# Patient Record
Sex: Male | Born: 1940
Health system: Southern US, Community
[De-identification: ages and names within clinical notes are randomized; demographics above are authoritative.]

## PROBLEM LIST (undated history)

## (undated) DIAGNOSIS — C801 Malignant (primary) neoplasm, unspecified: Secondary | ICD-10-CM

## (undated) DIAGNOSIS — E785 Hyperlipidemia, unspecified: Secondary | ICD-10-CM

## (undated) DIAGNOSIS — I255 Ischemic cardiomyopathy: Secondary | ICD-10-CM

## (undated) DIAGNOSIS — R011 Cardiac murmur, unspecified: Secondary | ICD-10-CM

## (undated) DIAGNOSIS — D126 Benign neoplasm of colon, unspecified: Secondary | ICD-10-CM

## (undated) DIAGNOSIS — H9319 Tinnitus, unspecified ear: Secondary | ICD-10-CM

## (undated) DIAGNOSIS — F419 Anxiety disorder, unspecified: Secondary | ICD-10-CM

## (undated) DIAGNOSIS — H9190 Unspecified hearing loss, unspecified ear: Secondary | ICD-10-CM

## (undated) DIAGNOSIS — E039 Hypothyroidism, unspecified: Secondary | ICD-10-CM

## (undated) DIAGNOSIS — IMO0002 Reserved for concepts with insufficient information to code with codable children: Secondary | ICD-10-CM

## (undated) DIAGNOSIS — D696 Thrombocytopenia, unspecified: Secondary | ICD-10-CM

## (undated) DIAGNOSIS — I4892 Unspecified atrial flutter: Secondary | ICD-10-CM

## (undated) DIAGNOSIS — I10 Essential (primary) hypertension: Secondary | ICD-10-CM

## (undated) DIAGNOSIS — K5790 Diverticulosis of intestine, part unspecified, without perforation or abscess without bleeding: Secondary | ICD-10-CM

## (undated) DIAGNOSIS — I2510 Atherosclerotic heart disease of native coronary artery without angina pectoris: Secondary | ICD-10-CM

## (undated) DIAGNOSIS — M542 Cervicalgia: Secondary | ICD-10-CM

## (undated) DIAGNOSIS — I4819 Other persistent atrial fibrillation: Secondary | ICD-10-CM

## (undated) DIAGNOSIS — B977 Papillomavirus as the cause of diseases classified elsewhere: Secondary | ICD-10-CM

## (undated) DIAGNOSIS — Z87438 Personal history of other diseases of male genital organs: Secondary | ICD-10-CM

## (undated) DIAGNOSIS — I519 Heart disease, unspecified: Secondary | ICD-10-CM

## (undated) DIAGNOSIS — Z923 Personal history of irradiation: Secondary | ICD-10-CM

## (undated) HISTORY — DX: Tinnitus, unspecified ear: H93.19

## (undated) HISTORY — DX: Essential (primary) hypertension: I10

## (undated) HISTORY — DX: Reserved for concepts with insufficient information to code with codable children: IMO0002

## (undated) HISTORY — DX: Other persistent atrial fibrillation: I48.19

## (undated) HISTORY — DX: Personal history of other diseases of male genital organs: Z87.438

## (undated) HISTORY — DX: Unspecified hearing loss, unspecified ear: H91.90

## (undated) HISTORY — DX: Hyperlipidemia, unspecified: E78.5

## (undated) HISTORY — DX: Heart disease, unspecified: I51.9

## (undated) HISTORY — DX: Diverticulosis of intestine, part unspecified, without perforation or abscess without bleeding: K57.90

## (undated) HISTORY — PX: OTHER SURGICAL HISTORY: SHX169

## (undated) HISTORY — PX: VASECTOMY: SHX75

## (undated) HISTORY — DX: Malignant (primary) neoplasm, unspecified: C80.1

## (undated) HISTORY — DX: Atherosclerotic heart disease of native coronary artery without angina pectoris: I25.10

## (undated) HISTORY — DX: Personal history of irradiation: Z92.3

## (undated) HISTORY — DX: Cervicalgia: M54.2

## (undated) HISTORY — DX: Anxiety disorder, unspecified: F41.9

## (undated) HISTORY — PX: TONSILLECTOMY: SUR1361

## (undated) HISTORY — DX: Unspecified atrial flutter: I48.92

## (undated) HISTORY — DX: Papillomavirus as the cause of diseases classified elsewhere: B97.7

## (undated) HISTORY — PX: DENTAL SURGERY: SHX609

## (undated) HISTORY — DX: Ischemic cardiomyopathy: I25.5

## (undated) HISTORY — PX: KNEE ARTHROSCOPY: SUR90

## (undated) HISTORY — DX: Cardiac murmur, unspecified: R01.1

## (undated) HISTORY — DX: Hypothyroidism, unspecified: E03.9

## (undated) HISTORY — PX: CARDIAC CATHETERIZATION: SHX172

## (undated) HISTORY — DX: Thrombocytopenia, unspecified: D69.6

## (undated) HISTORY — DX: Benign neoplasm of colon, unspecified: D12.6

## (undated) MED FILL — Dexamethasone Sodium Phosphate Inj 100 MG/10ML: INTRAMUSCULAR | Qty: 1 | Status: AC

---

## 2001-08-21 DIAGNOSIS — I2510 Atherosclerotic heart disease of native coronary artery without angina pectoris: Secondary | ICD-10-CM

## 2001-08-21 HISTORY — DX: Atherosclerotic heart disease of native coronary artery without angina pectoris: I25.10

## 2002-05-23 ENCOUNTER — Ambulatory Visit (HOSPITAL_COMMUNITY): Admission: RE | Admit: 2002-05-23 | Discharge: 2002-05-23 | Payer: Self-pay | Admitting: Cardiology

## 2002-07-09 ENCOUNTER — Ambulatory Visit (HOSPITAL_COMMUNITY): Admission: RE | Admit: 2002-07-09 | Discharge: 2002-07-09 | Payer: Self-pay | Admitting: Cardiology

## 2004-05-06 ENCOUNTER — Ambulatory Visit (HOSPITAL_COMMUNITY): Admission: RE | Admit: 2004-05-06 | Discharge: 2004-05-06 | Payer: Self-pay | Admitting: Internal Medicine

## 2004-06-16 ENCOUNTER — Ambulatory Visit (HOSPITAL_COMMUNITY): Admission: RE | Admit: 2004-06-16 | Discharge: 2004-06-16 | Payer: Self-pay | Admitting: Cardiology

## 2004-06-27 ENCOUNTER — Ambulatory Visit: Payer: Self-pay | Admitting: Cardiology

## 2004-06-29 ENCOUNTER — Ambulatory Visit: Payer: Self-pay | Admitting: Internal Medicine

## 2004-07-05 ENCOUNTER — Ambulatory Visit: Payer: Self-pay | Admitting: Internal Medicine

## 2004-07-18 ENCOUNTER — Ambulatory Visit (HOSPITAL_COMMUNITY): Admission: RE | Admit: 2004-07-18 | Discharge: 2004-07-18 | Payer: Self-pay | Admitting: Internal Medicine

## 2004-07-18 ENCOUNTER — Ambulatory Visit: Payer: Self-pay | Admitting: Internal Medicine

## 2004-10-26 ENCOUNTER — Ambulatory Visit: Payer: Self-pay | Admitting: Cardiology

## 2004-12-27 ENCOUNTER — Ambulatory Visit: Payer: Self-pay | Admitting: Cardiology

## 2004-12-29 ENCOUNTER — Ambulatory Visit: Payer: Self-pay | Admitting: Cardiology

## 2005-01-03 ENCOUNTER — Ambulatory Visit: Payer: Self-pay | Admitting: Internal Medicine

## 2005-01-03 ENCOUNTER — Ambulatory Visit (HOSPITAL_COMMUNITY): Admission: RE | Admit: 2005-01-03 | Discharge: 2005-01-03 | Payer: Self-pay | Admitting: Internal Medicine

## 2005-06-28 ENCOUNTER — Ambulatory Visit: Payer: Self-pay | Admitting: Internal Medicine

## 2006-07-02 ENCOUNTER — Ambulatory Visit: Payer: Self-pay | Admitting: Internal Medicine

## 2007-06-24 ENCOUNTER — Encounter: Payer: Self-pay | Admitting: Internal Medicine

## 2007-06-24 DIAGNOSIS — R351 Nocturia: Secondary | ICD-10-CM

## 2007-06-24 DIAGNOSIS — I2510 Atherosclerotic heart disease of native coronary artery without angina pectoris: Secondary | ICD-10-CM

## 2007-06-24 DIAGNOSIS — M542 Cervicalgia: Secondary | ICD-10-CM | POA: Insufficient documentation

## 2007-06-24 DIAGNOSIS — I10 Essential (primary) hypertension: Secondary | ICD-10-CM | POA: Insufficient documentation

## 2007-06-24 DIAGNOSIS — N401 Enlarged prostate with lower urinary tract symptoms: Secondary | ICD-10-CM

## 2007-07-05 ENCOUNTER — Telehealth: Payer: Self-pay | Admitting: Internal Medicine

## 2007-07-30 ENCOUNTER — Encounter: Payer: Self-pay | Admitting: Internal Medicine

## 2007-08-30 ENCOUNTER — Ambulatory Visit: Payer: Self-pay | Admitting: Internal Medicine

## 2008-05-19 ENCOUNTER — Telehealth: Payer: Self-pay | Admitting: Internal Medicine

## 2008-07-23 ENCOUNTER — Encounter: Payer: Self-pay | Admitting: Internal Medicine

## 2008-09-04 ENCOUNTER — Ambulatory Visit: Payer: Self-pay | Admitting: Internal Medicine

## 2008-09-04 DIAGNOSIS — I495 Sick sinus syndrome: Secondary | ICD-10-CM | POA: Insufficient documentation

## 2008-09-14 ENCOUNTER — Ambulatory Visit: Payer: Self-pay | Admitting: Internal Medicine

## 2009-06-28 ENCOUNTER — Telehealth (INDEPENDENT_AMBULATORY_CARE_PROVIDER_SITE_OTHER): Payer: Self-pay | Admitting: *Deleted

## 2009-08-27 ENCOUNTER — Ambulatory Visit: Payer: Self-pay | Admitting: Internal Medicine

## 2009-08-27 LAB — CONVERTED CEMR LAB
ALT: 27 units/L (ref 0–53)
AST: 28 units/L (ref 0–37)
Albumin: 4.2 g/dL (ref 3.5–5.2)
Alkaline Phosphatase: 49 units/L (ref 39–117)
BUN: 15 mg/dL (ref 6–23)
Bilirubin, Direct: 0.2 mg/dL (ref 0.0–0.3)
CO2: 30 meq/L (ref 19–32)
Calcium: 9.4 mg/dL (ref 8.4–10.5)
Chloride: 105 meq/L (ref 96–112)
Cholesterol: 97 mg/dL (ref 0–200)
Creatinine, Ser: 0.9 mg/dL (ref 0.4–1.5)
GFR calc non Af Amer: 88.68 mL/min (ref >60)
Glucose, Bld: 102 mg/dL — ABNORMAL HIGH (ref 70–99)
HDL: 39.3 mg/dL (ref >39.00)
LDL Cholesterol: 48 mg/dL (ref 0–99)
PSA: 0.11 ng/mL (ref 0.10–4.00)
Potassium: 3.8 meq/L (ref 3.5–5.1)
Sodium: 143 meq/L (ref 135–145)
Total Bilirubin: 1 mg/dL (ref 0.3–1.2)
Total CHOL/HDL Ratio: 2
Total Protein: 7.1 g/dL (ref 6.0–8.3)
Triglycerides: 48 mg/dL (ref 0.0–149.0)
VLDL: 9.6 mg/dL (ref 0.0–40.0)

## 2009-09-08 ENCOUNTER — Ambulatory Visit: Payer: Self-pay | Admitting: Internal Medicine

## 2009-10-14 ENCOUNTER — Encounter: Payer: Self-pay | Admitting: Internal Medicine

## 2009-10-29 ENCOUNTER — Ambulatory Visit: Payer: Self-pay | Admitting: Internal Medicine

## 2009-10-29 DIAGNOSIS — R197 Diarrhea, unspecified: Secondary | ICD-10-CM

## 2009-11-01 ENCOUNTER — Encounter: Payer: Self-pay | Admitting: Internal Medicine

## 2009-12-16 ENCOUNTER — Ambulatory Visit: Payer: Self-pay | Admitting: Internal Medicine

## 2009-12-22 ENCOUNTER — Telehealth: Payer: Self-pay | Admitting: Internal Medicine

## 2009-12-24 ENCOUNTER — Other Ambulatory Visit: Admission: RE | Admit: 2009-12-24 | Discharge: 2009-12-24 | Payer: Self-pay | Admitting: Otolaryngology

## 2009-12-24 ENCOUNTER — Encounter: Payer: Self-pay | Admitting: Internal Medicine

## 2009-12-29 ENCOUNTER — Ambulatory Visit: Payer: Self-pay | Admitting: Oncology

## 2010-01-04 ENCOUNTER — Ambulatory Visit: Admission: RE | Admit: 2010-01-04 | Discharge: 2010-03-22 | Payer: Self-pay | Admitting: Radiation Oncology

## 2010-01-05 ENCOUNTER — Encounter: Payer: Self-pay | Admitting: Internal Medicine

## 2010-01-05 LAB — COMPREHENSIVE METABOLIC PANEL
ALT: 24 U/L (ref 0–53)
Alkaline Phosphatase: 56 U/L (ref 39–117)
Sodium: 141 mEq/L (ref 135–145)
Total Bilirubin: 0.7 mg/dL (ref 0.3–1.2)
Total Protein: 6.2 g/dL (ref 6.0–8.3)

## 2010-01-05 LAB — CBC WITH DIFFERENTIAL/PLATELET
BASO%: 0 % (ref 0.0–2.0)
LYMPH%: 23 % (ref 14.0–49.0)
MCHC: 34.6 g/dL (ref 32.0–36.0)
MCV: 96 fL (ref 79.3–98.0)
MONO%: 6.6 % (ref 0.0–14.0)
Platelets: 148 10*3/uL (ref 140–400)
RBC: 4.57 10*6/uL (ref 4.20–5.82)
WBC: 6.2 10*3/uL (ref 4.0–10.3)

## 2010-01-06 ENCOUNTER — Encounter: Admission: RE | Admit: 2010-01-06 | Discharge: 2010-04-06 | Payer: Self-pay | Admitting: Oncology

## 2010-01-07 ENCOUNTER — Ambulatory Visit: Payer: Self-pay | Admitting: Dentistry

## 2010-01-07 ENCOUNTER — Encounter: Admission: AD | Admit: 2010-01-07 | Discharge: 2010-01-07 | Payer: Self-pay | Admitting: Dentistry

## 2010-01-07 ENCOUNTER — Telehealth: Payer: Self-pay | Admitting: Internal Medicine

## 2010-01-13 ENCOUNTER — Ambulatory Visit (HOSPITAL_COMMUNITY): Admission: RE | Admit: 2010-01-13 | Discharge: 2010-01-13 | Payer: Self-pay | Admitting: Radiation Oncology

## 2010-01-21 ENCOUNTER — Encounter: Payer: Self-pay | Admitting: Internal Medicine

## 2010-01-21 ENCOUNTER — Encounter: Payer: Self-pay | Admitting: Cardiology

## 2010-01-21 LAB — COMPREHENSIVE METABOLIC PANEL
ALT: 22 U/L (ref 0–53)
AST: 21 U/L (ref 0–37)
Albumin: 4.4 g/dL (ref 3.5–5.2)
Alkaline Phosphatase: 50 U/L (ref 39–117)
Calcium: 9.4 mg/dL (ref 8.4–10.5)
Chloride: 105 mEq/L (ref 96–112)
Potassium: 4 mEq/L (ref 3.5–5.3)
Sodium: 141 mEq/L (ref 135–145)
Total Protein: 6.6 g/dL (ref 6.0–8.3)

## 2010-01-21 LAB — CBC WITH DIFFERENTIAL/PLATELET
EOS%: 2 % (ref 0.0–7.0)
HGB: 16.1 g/dL (ref 13.0–17.1)
MCH: 33.2 pg (ref 27.2–33.4)
MCV: 95.2 fL (ref 79.3–98.0)
MONO%: 9 % (ref 0.0–14.0)
NEUT#: 2.4 10*3/uL (ref 1.5–6.5)
RBC: 4.85 10*6/uL (ref 4.20–5.82)
RDW: 12.6 % (ref 11.0–14.6)
lymph#: 1.4 10*3/uL (ref 0.9–3.3)

## 2010-01-31 ENCOUNTER — Ambulatory Visit: Payer: Self-pay | Admitting: Oncology

## 2010-01-31 ENCOUNTER — Encounter: Payer: Self-pay | Admitting: Internal Medicine

## 2010-01-31 LAB — COMPREHENSIVE METABOLIC PANEL
AST: 26 U/L (ref 0–37)
Alkaline Phosphatase: 45 U/L (ref 39–117)
Glucose, Bld: 94 mg/dL (ref 70–99)
Sodium: 142 mEq/L (ref 135–145)
Total Bilirubin: 0.4 mg/dL (ref 0.3–1.2)
Total Protein: 6.6 g/dL (ref 6.0–8.3)

## 2010-01-31 LAB — CBC WITH DIFFERENTIAL/PLATELET
BASO%: 0.2 % (ref 0.0–2.0)
Basophils Absolute: 0 10*3/uL (ref 0.0–0.1)
EOS%: 1.9 % (ref 0.0–7.0)
MCH: 32.6 pg (ref 27.2–33.4)
MCHC: 34.5 g/dL (ref 32.0–36.0)
MCV: 94.3 fL (ref 79.3–98.0)
MONO%: 9 % (ref 0.0–14.0)
RBC: 4.76 10*6/uL (ref 4.20–5.82)
RDW: 12.8 % (ref 11.0–14.6)
nRBC: 0 % (ref 0–0)

## 2010-02-02 ENCOUNTER — Telehealth: Payer: Self-pay | Admitting: Internal Medicine

## 2010-02-02 ENCOUNTER — Ambulatory Visit: Payer: Self-pay | Admitting: Internal Medicine

## 2010-02-04 ENCOUNTER — Ambulatory Visit: Payer: Self-pay | Admitting: Internal Medicine

## 2010-02-07 LAB — BASIC METABOLIC PANEL
CO2: 27 mEq/L (ref 19–32)
Calcium: 9.7 mg/dL (ref 8.4–10.5)
Glucose, Bld: 138 mg/dL — ABNORMAL HIGH (ref 70–99)
Sodium: 138 mEq/L (ref 135–145)

## 2010-02-14 LAB — BASIC METABOLIC PANEL
BUN: 19 mg/dL (ref 6–23)
CO2: 26 mEq/L (ref 19–32)
Chloride: 105 mEq/L (ref 96–112)
Creatinine, Ser: 0.95 mg/dL (ref 0.40–1.50)

## 2010-02-16 ENCOUNTER — Encounter: Payer: Self-pay | Admitting: Internal Medicine

## 2010-02-18 HISTORY — PX: PEG PLACEMENT: SHX5437

## 2010-02-22 LAB — CBC WITH DIFFERENTIAL/PLATELET
Eosinophils Absolute: 0 10*3/uL (ref 0.0–0.5)
LYMPH%: 16.8 % (ref 14.0–49.0)
MONO#: 0.5 10*3/uL (ref 0.1–0.9)
NEUT#: 2.2 10*3/uL (ref 1.5–6.5)
Platelets: 179 10*3/uL (ref 140–400)
RBC: 4.25 10*6/uL (ref 4.20–5.82)
RDW: 12.3 % (ref 11.0–14.6)
WBC: 3.2 10*3/uL — ABNORMAL LOW (ref 4.0–10.3)

## 2010-02-22 LAB — COMPREHENSIVE METABOLIC PANEL
Albumin: 4.4 g/dL (ref 3.5–5.2)
CO2: 24 mEq/L (ref 19–32)
Calcium: 9.8 mg/dL (ref 8.4–10.5)
Chloride: 103 mEq/L (ref 96–112)
Glucose, Bld: 143 mg/dL — ABNORMAL HIGH (ref 70–99)
Potassium: 4.6 mEq/L (ref 3.5–5.3)
Sodium: 139 mEq/L (ref 135–145)
Total Protein: 6.5 g/dL (ref 6.0–8.3)

## 2010-02-22 LAB — MAGNESIUM: Magnesium: 2.1 mg/dL (ref 1.5–2.5)

## 2010-02-23 ENCOUNTER — Encounter: Payer: Self-pay | Admitting: Internal Medicine

## 2010-03-01 ENCOUNTER — Encounter: Payer: Self-pay | Admitting: Internal Medicine

## 2010-03-02 ENCOUNTER — Ambulatory Visit: Payer: Self-pay | Admitting: Oncology

## 2010-03-02 LAB — BASIC METABOLIC PANEL
Calcium: 10.4 mg/dL (ref 8.4–10.5)
Sodium: 137 mEq/L (ref 135–145)

## 2010-03-02 LAB — MAGNESIUM: Magnesium: 1.7 mg/dL (ref 1.5–2.5)

## 2010-03-03 LAB — CBC WITH DIFFERENTIAL/PLATELET
BASO%: 0.1 % (ref 0.0–2.0)
HCT: 40.6 % (ref 38.4–49.9)
LYMPH%: 11.4 % — ABNORMAL LOW (ref 14.0–49.0)
MCH: 33.7 pg — ABNORMAL HIGH (ref 27.2–33.4)
MCHC: 35.3 g/dL (ref 32.0–36.0)
MCV: 95.7 fL (ref 79.3–98.0)
MONO#: 0.4 10*3/uL (ref 0.1–0.9)
MONO%: 9.7 % (ref 0.0–14.0)
NEUT%: 78.4 % — ABNORMAL HIGH (ref 39.0–75.0)
Platelets: 76 10*3/uL — ABNORMAL LOW (ref 140–400)
RBC: 4.25 10*6/uL (ref 4.20–5.82)

## 2010-03-08 ENCOUNTER — Ambulatory Visit (HOSPITAL_COMMUNITY): Admission: RE | Admit: 2010-03-08 | Discharge: 2010-03-08 | Payer: Self-pay | Admitting: Oncology

## 2010-03-08 LAB — BASIC METABOLIC PANEL
CO2: 24 mEq/L (ref 19–32)
Calcium: 9.4 mg/dL (ref 8.4–10.5)
Creatinine, Ser: 1.22 mg/dL (ref 0.40–1.50)
Sodium: 140 mEq/L (ref 135–145)

## 2010-03-08 LAB — MAGNESIUM: Magnesium: 1.9 mg/dL (ref 1.5–2.5)

## 2010-03-09 ENCOUNTER — Encounter: Payer: Self-pay | Admitting: Internal Medicine

## 2010-03-13 ENCOUNTER — Inpatient Hospital Stay (HOSPITAL_COMMUNITY): Admission: EM | Admit: 2010-03-13 | Discharge: 2010-03-18 | Payer: Self-pay | Admitting: Internal Medicine

## 2010-03-14 ENCOUNTER — Ambulatory Visit: Payer: Self-pay | Admitting: Oncology

## 2010-03-22 ENCOUNTER — Encounter: Payer: Self-pay | Admitting: Internal Medicine

## 2010-03-28 ENCOUNTER — Ambulatory Visit: Payer: Self-pay | Admitting: Internal Medicine

## 2010-03-28 DIAGNOSIS — C099 Malignant neoplasm of tonsil, unspecified: Secondary | ICD-10-CM

## 2010-04-04 ENCOUNTER — Encounter: Payer: Self-pay | Admitting: Internal Medicine

## 2010-04-12 ENCOUNTER — Ambulatory Visit: Payer: Self-pay | Admitting: Oncology

## 2010-04-14 ENCOUNTER — Encounter: Payer: Self-pay | Admitting: Internal Medicine

## 2010-04-14 LAB — COMPREHENSIVE METABOLIC PANEL
CO2: 27 mEq/L (ref 19–32)
Creatinine, Ser: 0.8 mg/dL (ref 0.40–1.50)
Glucose, Bld: 195 mg/dL — ABNORMAL HIGH (ref 70–99)
Sodium: 139 mEq/L (ref 135–145)
Total Bilirubin: 0.6 mg/dL (ref 0.3–1.2)
Total Protein: 6.4 g/dL (ref 6.0–8.3)

## 2010-04-14 LAB — CBC WITH DIFFERENTIAL/PLATELET
Basophils Absolute: 0 10*3/uL (ref 0.0–0.1)
Eosinophils Absolute: 0.1 10*3/uL (ref 0.0–0.5)
HCT: 39.7 % (ref 38.4–49.9)
HGB: 13.9 g/dL (ref 13.0–17.1)
LYMPH%: 8.6 % — ABNORMAL LOW (ref 14.0–49.0)
MONO#: 0.3 10*3/uL (ref 0.1–0.9)
NEUT#: 4.3 10*3/uL (ref 1.5–6.5)
NEUT%: 83.3 % — ABNORMAL HIGH (ref 39.0–75.0)
Platelets: 142 10*3/uL (ref 140–400)
WBC: 5.2 10*3/uL (ref 4.0–10.3)

## 2010-04-26 ENCOUNTER — Telehealth: Payer: Self-pay | Admitting: Internal Medicine

## 2010-04-27 ENCOUNTER — Encounter: Payer: Self-pay | Admitting: Internal Medicine

## 2010-05-03 ENCOUNTER — Ambulatory Visit: Payer: Self-pay | Admitting: Dentistry

## 2010-05-05 ENCOUNTER — Encounter
Admission: RE | Admit: 2010-05-05 | Discharge: 2010-08-03 | Payer: Self-pay | Source: Home / Self Care | Attending: Oncology | Admitting: Oncology

## 2010-06-06 ENCOUNTER — Ambulatory Visit: Payer: Self-pay | Admitting: Oncology

## 2010-06-08 ENCOUNTER — Ambulatory Visit (HOSPITAL_COMMUNITY): Admission: RE | Admit: 2010-06-08 | Discharge: 2010-06-08 | Payer: Self-pay | Admitting: Oncology

## 2010-06-15 ENCOUNTER — Ambulatory Visit (HOSPITAL_COMMUNITY): Admission: RE | Admit: 2010-06-15 | Discharge: 2010-06-15 | Payer: Self-pay | Admitting: Oncology

## 2010-06-16 ENCOUNTER — Encounter: Payer: Self-pay | Admitting: Internal Medicine

## 2010-06-16 ENCOUNTER — Ambulatory Visit: Payer: Self-pay | Admitting: Psychiatry

## 2010-06-16 LAB — COMPREHENSIVE METABOLIC PANEL
ALT: 24 U/L (ref 0–53)
AST: 20 U/L (ref 0–37)
Albumin: 4.2 g/dL (ref 3.5–5.2)
Alkaline Phosphatase: 52 U/L (ref 39–117)
BUN: 20 mg/dL (ref 6–23)
CO2: 29 mEq/L (ref 19–32)
Calcium: 9.5 mg/dL (ref 8.4–10.5)
Chloride: 102 mEq/L (ref 96–112)
Creatinine, Ser: 0.83 mg/dL (ref 0.40–1.50)
Glucose, Bld: 186 mg/dL — ABNORMAL HIGH (ref 70–99)
Potassium: 4.3 mEq/L (ref 3.5–5.3)
Sodium: 141 mEq/L (ref 135–145)
Total Bilirubin: 0.6 mg/dL (ref 0.3–1.2)
Total Protein: 6.3 g/dL (ref 6.0–8.3)

## 2010-06-16 LAB — CBC WITH DIFFERENTIAL/PLATELET
BASO%: 0.1 % (ref 0.0–2.0)
Basophils Absolute: 0 10*3/uL (ref 0.0–0.1)
EOS%: 2.3 % (ref 0.0–7.0)
Eosinophils Absolute: 0.1 10*3/uL (ref 0.0–0.5)
HCT: 42.6 % (ref 38.4–49.9)
HGB: 15.2 g/dL (ref 13.0–17.1)
LYMPH%: 11.4 % — ABNORMAL LOW (ref 14.0–49.0)
MCH: 35.4 pg — ABNORMAL HIGH (ref 27.2–33.4)
MCHC: 35.7 g/dL (ref 32.0–36.0)
MCV: 99 fL — ABNORMAL HIGH (ref 79.3–98.0)
MONO#: 0.2 10*3/uL (ref 0.1–0.9)
MONO%: 6.1 % (ref 0.0–14.0)
NEUT#: 3.1 10*3/uL (ref 1.5–6.5)
NEUT%: 80.1 % — ABNORMAL HIGH (ref 39.0–75.0)
Platelets: 146 10*3/uL (ref 140–400)
RBC: 4.3 10*6/uL (ref 4.20–5.82)
RDW: 11.6 % (ref 11.0–14.6)
WBC: 3.9 10*3/uL — ABNORMAL LOW (ref 4.0–10.3)
lymph#: 0.4 10*3/uL — ABNORMAL LOW (ref 0.9–3.3)

## 2010-06-21 ENCOUNTER — Ambulatory Visit: Payer: Self-pay | Admitting: Psychiatry

## 2010-06-23 ENCOUNTER — Ambulatory Visit: Payer: Self-pay | Admitting: Internal Medicine

## 2010-06-23 DIAGNOSIS — R6882 Decreased libido: Secondary | ICD-10-CM | POA: Insufficient documentation

## 2010-06-23 LAB — CONVERTED CEMR LAB: Testosterone: 303.98 ng/dL — ABNORMAL LOW (ref 350.00–890.00)

## 2010-06-24 ENCOUNTER — Telehealth: Payer: Self-pay | Admitting: Internal Medicine

## 2010-06-27 ENCOUNTER — Telehealth: Payer: Self-pay | Admitting: Internal Medicine

## 2010-07-19 ENCOUNTER — Ambulatory Visit: Payer: Self-pay | Admitting: Psychiatry

## 2010-07-21 ENCOUNTER — Ambulatory Visit: Payer: Self-pay | Admitting: Internal Medicine

## 2010-08-31 ENCOUNTER — Encounter: Payer: Self-pay | Admitting: Internal Medicine

## 2010-09-02 ENCOUNTER — Ambulatory Visit: Payer: Self-pay | Admitting: Oncology

## 2010-09-06 ENCOUNTER — Ambulatory Visit
Admission: RE | Admit: 2010-09-06 | Discharge: 2010-09-06 | Payer: Self-pay | Source: Home / Self Care | Attending: Psychiatry | Admitting: Psychiatry

## 2010-09-09 ENCOUNTER — Other Ambulatory Visit: Payer: Self-pay | Admitting: Internal Medicine

## 2010-09-09 ENCOUNTER — Ambulatory Visit
Admission: RE | Admit: 2010-09-09 | Discharge: 2010-09-09 | Payer: Self-pay | Source: Home / Self Care | Attending: Internal Medicine | Admitting: Internal Medicine

## 2010-09-09 ENCOUNTER — Encounter: Payer: Self-pay | Admitting: Internal Medicine

## 2010-09-09 LAB — CBC WITH DIFFERENTIAL/PLATELET
Basophils Absolute: 0 10*3/uL (ref 0.0–0.1)
Basophils Relative: 0.2 % (ref 0.0–3.0)
Eosinophils Absolute: 0.2 10*3/uL (ref 0.0–0.7)
Eosinophils Relative: 2.6 % (ref 0.0–5.0)
HCT: 46.2 % (ref 39.0–52.0)
Hemoglobin: 16.3 g/dL (ref 13.0–17.0)
Lymphocytes Relative: 10.3 % — ABNORMAL LOW (ref 12.0–46.0)
Lymphs Abs: 0.6 10*3/uL — ABNORMAL LOW (ref 0.7–4.0)
MCHC: 35.2 g/dL (ref 30.0–36.0)
MCV: 99.1 fl (ref 78.0–100.0)
Monocytes Absolute: 0.5 10*3/uL (ref 0.1–1.0)
Monocytes Relative: 7.6 % (ref 3.0–12.0)
Neutro Abs: 5 10*3/uL (ref 1.4–7.7)
Neutrophils Relative %: 79.3 % — ABNORMAL HIGH (ref 43.0–77.0)
Platelets: 155 10*3/uL (ref 150.0–400.0)
RBC: 4.66 Mil/uL (ref 4.22–5.81)
RDW: 12.6 % (ref 11.5–14.6)
WBC: 6.3 10*3/uL (ref 4.5–10.5)

## 2010-09-09 LAB — BASIC METABOLIC PANEL
BUN: 18 mg/dL (ref 6–23)
CO2: 30 mEq/L (ref 19–32)
Calcium: 9.6 mg/dL (ref 8.4–10.5)
Chloride: 103 mEq/L (ref 96–112)
Creatinine, Ser: 0.9 mg/dL (ref 0.4–1.5)
GFR: 94.45 mL/min (ref >60.00)
Glucose, Bld: 108 mg/dL — ABNORMAL HIGH (ref 70–99)
Potassium: 4.7 mEq/L (ref 3.5–5.1)
Sodium: 141 mEq/L (ref 135–145)

## 2010-09-09 LAB — HEPATIC FUNCTION PANEL
ALT: 30 U/L (ref 0–53)
AST: 24 U/L (ref 0–37)
Albumin: 4.5 g/dL (ref 3.5–5.2)
Alkaline Phosphatase: 62 U/L (ref 39–117)
Bilirubin, Direct: 0.1 mg/dL (ref 0.0–0.3)
Total Bilirubin: 0.9 mg/dL (ref 0.3–1.2)
Total Protein: 7 g/dL (ref 6.0–8.3)

## 2010-09-09 LAB — PSA: PSA: 0.07 ng/mL — ABNORMAL LOW (ref 0.10–4.00)

## 2010-09-09 LAB — LIPID PANEL
Cholesterol: 164 mg/dL (ref 0–200)
HDL: 30.9 mg/dL — ABNORMAL LOW (ref >39.00)
LDL Cholesterol: 101 mg/dL — ABNORMAL HIGH (ref 0–99)
Total CHOL/HDL Ratio: 5
Triglycerides: 159 mg/dL — ABNORMAL HIGH (ref 0.0–149.0)
VLDL: 31.8 mg/dL (ref 0.0–40.0)

## 2010-09-09 LAB — TSH: TSH: 2.89 u[IU]/mL (ref 0.35–5.50)

## 2010-09-10 ENCOUNTER — Other Ambulatory Visit: Payer: Self-pay | Admitting: Oncology

## 2010-09-10 DIAGNOSIS — C099 Malignant neoplasm of tonsil, unspecified: Secondary | ICD-10-CM

## 2010-09-20 NOTE — Progress Notes (Signed)
  Phone Note Refill Request Message from:  Fax from Pharmacy on April 26, 2010 9:49 AM  Refills Requested: Medication #1:  SONATA 10 MG  CAPS at bedtime as needed Please Advise refill  Initial call taken by: Ami Bullins CMA,  April 26, 2010 9:49 AM  Follow-up for Phone Call        ok to refill x 5 Follow-up by: [name] MD,  April 26, 2010 10:17 AM    Prescriptions: SONATA 10 MG  CAPS (ZALEPLON) at bedtime as needed  #30 x 5   Entered by:   Ami Bullins CMA   Authorized by:   [name] MD   Signed by:   Bill Salinas CMA on 04/26/2010   Method used:   Telephoned to ...       [facility] [address] (retail)       [address]       [place], [place]  [address]       Ph: [phone]       Fax: [phone]   RxID:   [id number]

## 2010-09-20 NOTE — Progress Notes (Signed)
  Phone Note Outgoing Call   Reason for Call: Discuss lab or test results Summary of Call: please call patient: testoterone level is 303 just under normal of 350-911. We can discuss replacement at next office visit.   Thanks Initial call taken by: [name] MD,  June 27, 2010 2:23 PM  Follow-up for Phone Call        I talked to him sat and informed him Follow-up by: Ami Bullins CMA,  June 27, 2010 2:27 PM

## 2010-09-20 NOTE — Assessment & Plan Note (Signed)
Summary: SWOLLEN GLAND /NWS   Vital Signs:  Patient profile:   70 year old male Height:      75 inches (190.50 cm) Weight:      221.25 pounds (100.57 kg) BMI:     27.75 O2 Sat:      94 % on Room air Temp:     98.3 degrees F (36.83 degrees C) oral Pulse rate:   60 / minute Pulse rhythm:   regular BP sitting:   108 / 68  (left arm) Cuff size:   large  Vitals Entered By: Brenton Grills (December 16, 2009 1:35 PM)  O2 Flow:  Room air CC: pt c/o swollen glands on right side of neck x 1 week/pt states there is no pain when swallowing/aj   Primary Care Provider:  Shondell Poulson  CC:  pt c/o swollen glands on right side of neck x 1 week/pt states there is no pain when swallowing/aj.  History of Present Illness: Patient was seen March 11th for a viral diarrheal with negative stool cultures and the symptoms did resolve although he says this was as sick as he had been in a long time.   He presents today for swollen gland on the right neck, just below the jaw line. Sudden on-set of swelling but slow to resolve, never painful or tender, no fever or chills. NO bad tastes in mouth, no xerostomia.  No night sweats, no change in appetite.   Current Medications (verified): 1)  Crestor 20 Mg  Tabs (Rosuvastatin Calcium) .... Once Daily 2)  Lisinopril 20 Mg  Tabs (Lisinopril) .... Once Daily 3)  Niaspan 500 Mg  Tbcr (Niacin (Antihyperlipidemic)) .... Once Daily 4)  Avodart 0.5 Mg  Caps (Dutasteride) .... Take 1 Tablet By Mouth Every Morning 5)  Sonata 10 Mg  Caps (Zaleplon) .... At Bedtime As Needed 6)  Furosemide 40 Mg Tabs (Furosemide) .Marland Kitchen.. 1 By Mouth Once Daily 7)  Ibuprofen 200 Mg Caps (Ibuprofen) .... As Needed 8)  Melatonin 3 Mg Caps (Melatonin) .... As Needed 9)  Promethazine Hcl 25 Mg Tabs (Promethazine Hcl) .Marland Kitchen.. 1po Q 6 Hrs As Needed Nausea 10)  Lomotil 2.5-0.025 Mg Tabs (Diphenoxylate-Atropine) .Marland Kitchen.. 1 By Mouth As Needed Loose Stool (Max 8 Tabs Per Day)  Allergies (verified): No Known Drug  Allergies  Past History:  Past Medical History: Last updated: 08/30/2007 NECK PAIN (ICD-723.1) CORONARY ARTERY DISEASE (ICD-414.00) HYPERTENSION (ICD-401.9) BENIGN PROSTATIC HYPERTROPHY, HX OF (ICD-V13.8) Cardiac-h/o PVCs. Had cardiac cath Oct '03 - nonobstructive disease with 40% LAD ostial, 30% proximal, 40%            distal; 30% ostial CFX; nl RCA. Normal LV function. Enrolled in the ASTEROID trail w/ Crestor.  Past Surgical History: Last updated: 08/30/2007 Tonsillectomy Vasectomy Left knee arthroscopy Dental extractions PSH reviewed for relevance, FH reviewed for relevance  Review of Systems  The patient denies anorexia, fever, weight loss, hoarseness, chest pain, dyspnea on exertion, prolonged cough, headaches, abdominal pain, incontinence, muscle weakness, difficulty walking, abnormal bleeding, and angioedema.    Physical Exam  General:  WNWD white male looking younger than his stated age in no distress Head:  Normocephalic and atraumatic without obvious abnormalities. No apparent alopecia or balding. Eyes:  corneas and lenses clear.   Neck:  supple and full ROM.   Lungs:  Normal respiratory effort, chest expands symmetrically. Lungs are clear to auscultation, no crackles or wheezes. Heart:  normal rate and regular rhythm.   Cervical Nodes:  1 cm mobile, slightly tender lymph  node right anterior cervical chain near the angle of the Jaw. No other cerivcal nodes  Axillary Nodes:  none Inguinal Nodes:  none   Impression & Recommendations:  Problem # 1:  ENLARGEMENT OF LYMPH NODES (ICD-785.6) Enlarged lymph node right anterior cerivcal chain that was much bigger and become smaller over the last week. Suspect lymphadenitis. No indication for antibiotics at this time.  Plan - if the node doesn't continue to resolve will need EN T consult, Narda Bonds, for biopsy.  Complete Medication List: 1)  Crestor 20 Mg Tabs (Rosuvastatin calcium) .... Once daily 2)  Lisinopril  20 Mg Tabs (Lisinopril) .... Once daily 3)  Niaspan 500 Mg Tbcr (Niacin (antihyperlipidemic)) .... Once daily 4)  Avodart 0.5 Mg Caps (Dutasteride) .... Take 1 tablet by mouth every morning 5)  Sonata 10 Mg Caps (Zaleplon) .... At bedtime as needed 6)  Furosemide 40 Mg Tabs (Furosemide) .Marland Kitchen.. 1 by mouth once daily 7)  Ibuprofen 200 Mg Caps (Ibuprofen) .... As needed 8)  Melatonin 3 Mg Caps (Melatonin) .... As needed 9)  Promethazine Hcl 25 Mg Tabs (Promethazine hcl) .Marland Kitchen.. 1po q 6 hrs as needed nausea 10)  Lomotil 2.5-0.025 Mg Tabs (Diphenoxylate-atropine) .Marland Kitchen.. 1 by mouth as needed loose stool (max 8 tabs per day)

## 2010-09-20 NOTE — Consult Note (Signed)
Summary: Daiva Huge MD  Daiva Huge MD   Imported By: Lester [place] 01/12/2010 10:09:49  _____________________________________________________________________  External Attachment:    Type:   Image     Comment:   External Document

## 2010-09-20 NOTE — Assessment & Plan Note (Signed)
Summary: bp check/cd  Nurse Visit   Vital Signs:  Patient profile:   70 year old male Pulse rate:   50 / minute BP sitting:   170 / 86  (left arm)  Vitals Entered By: Lamar Sprinkles, CMA (February 04, 2010 9:53 AM) CC: Elevated BP / New med Comments MD informed and advised pt to increase amlodipine to 10mg  daily   Allergies: No Known Drug Allergies  Orders Added: 1)  Est. Patient Level I [[address]]

## 2010-09-20 NOTE — Letter (Signed)
[facility]   Imported By: Lester [place] 04/28/2010 08:18:06  _____________________________________________________________________  External Attachment:    Type:   Image     Comment:   External Document

## 2010-09-20 NOTE — Assessment & Plan Note (Signed)
Summary: BP IS HIGH/NWS   Vital Signs:  Patient profile:   70 year old male Height:      75 inches Weight:      230 pounds BMI:     28.85 O2 Sat:      96 % on Room air Temp:     97.6 degrees F oral Pulse rate:   41 / minute BP sitting:   168 / 90  (left arm) Cuff size:   large  Vitals Entered By: Bill Salinas CMA (February 02, 2010 9:43 AM)  O2 Flow:  Room air CC: pt here for evaluation on elevated BP/ ab   Primary Care Provider:  Gauri Galvao  CC:  pt here for evaluation on elevated BP/ ab.  History of Present Illness: Patient seen as acute walk in for hypertension. He has been well controlled on lisinopril and lasix. He was instructed to stop lasix due to XRT to right tonsilar fossa and chem with cis-platinum q 3 weeks. He just started treatment with XRT 5 days a week x 35 treatments, and first round of chemo. He did get a very large volume of fluid and developed edema. His blood pressure and been trending up off lasix but has been really high over the past several days. He has had no symptoms: no headache, double vision, blurred vision, chest pain, SOB.  Current Medications (verified): 1)  Crestor 20 Mg  Tabs (Rosuvastatin Calcium) .... Once Daily 2)  Lisinopril 20 Mg  Tabs (Lisinopril) .... Once Daily 3)  Niaspan 500 Mg  Tbcr (Niacin (Antihyperlipidemic)) .... Once Daily 4)  Avodart 0.5 Mg  Caps (Dutasteride) .... Take 1 Tablet By Mouth Every Morning 5)  Sonata 10 Mg  Caps (Zaleplon) .... At Bedtime As Needed 6)  Furosemide 40 Mg Tabs (Furosemide) .Marland Kitchen.. 1 By Mouth Once Daily 7)  Ibuprofen 200 Mg Caps (Ibuprofen) .... As Needed 8)  Melatonin 3 Mg Caps (Melatonin) .... As Needed  Allergies (verified): No Known Drug Allergies PMH-FH-SH reviewed-no changes except otherwise noted  Review of Systems       The patient complains of weight gain and peripheral edema.  The patient denies anorexia, weight loss, vision loss, decreased hearing, chest pain, dyspnea on exertion, and abdominal  pain.         fluid weight -4 lbs over two days  Physical Exam  General:  rechecked BP right arm sitting 204/108. WNWD Head:  normocephalic and atraumatic.   Lungs:  normal respiratory effort.   Heart:  normal rate and regular rhythm.   Neurologic:  alert & oriented X3 and cranial nerves II-XII intact.     Impression & Recommendations:  Problem # 1:  HYPERTENSION (ICD-401.9) Poor control off of furosemide  Plan - continue lisinopril 20           add amlodipine 5mg  once daily           continue to monitor.   The following medications were removed from the medication list:    Furosemide 40 Mg Tabs (Furosemide) .Marland Kitchen... 1 by mouth once daily His updated medication list for this problem includes:    Lisinopril 20 Mg Tabs (Lisinopril) ..... Once daily    Amlodipine Besylate 5 Mg Tabs (Amlodipine besylate) .Marland Kitchen... 1 by mouth once daily  Complete Medication List: 1)  Crestor 20 Mg Tabs (Rosuvastatin calcium) .... Once daily 2)  Lisinopril 20 Mg Tabs (Lisinopril) .... Once daily 3)  Niaspan 500 Mg Tbcr (Niacin (antihyperlipidemic)) .... Once daily 4)  Avodart 0.5  Mg Caps (Dutasteride) .... Take 1 tablet by mouth every morning 5)  Sonata 10 Mg Caps (Zaleplon) .... At bedtime as needed 6)  Ibuprofen 200 Mg Caps (Ibuprofen) .... As needed 7)  Melatonin 3 Mg Caps (Melatonin) .... As needed 8)  Amlodipine Besylate 5 Mg Tabs (Amlodipine besylate) .Marland Kitchen.. 1 by mouth once daily Prescriptions: AMLODIPINE BESYLATE 5 MG TABS (AMLODIPINE BESYLATE) 1 by mouth once daily  #30 x 12   Entered and Authorized by:   [name] MD   Signed by:   Lamar Sprinkles, CMA on 02/02/2010   Method used:   Electronically to        [facility] (retail)       [address]       [place], [place]  [address]       Ph: [phone]       Fax: [phone]   RxID:   [id number]

## 2010-09-20 NOTE — Progress Notes (Signed)
Summary: BP MED  Phone Note From Other Clinic   Summary of Call: Patient walked into the office and wanted to discuss Furosemide.  Initial call taken by: Lamar Sprinkles, CMA,  Jan 07, 2010 10:47 AM  Follow-up for Phone Call        recently diagnosed with tonsilar adenocarcinoma. He will be undergoing treatment with chemo and XRT directed by Dr. Gaylyn Rong and Dayton Scrape. Due to xerostomia and dehydration from treatment they recommend stopping furosemide.  Plan - will stop furosemide. He will report back on BP readings. will add second agent as needed.  Follow-up by: [name] MD,  Jan 07, 2010 6:42 PM

## 2010-09-20 NOTE — Letter (Signed)
[facility]   Imported By: Sherian Rein 02/01/2010 10:01:17  _____________________________________________________________________  External Attachment:    Type:   Image     Comment:   External Document

## 2010-09-20 NOTE — Letter (Signed)
[facility]   Imported By: Sherian Rein 03/04/2010 11:32:26  _____________________________________________________________________  External Attachment:    Type:   Image     Comment:   External Document

## 2010-09-20 NOTE — Progress Notes (Signed)
[facility]: [facility]   Imported By: Debby [place]ib[place] 02/12/2010 09:55:44  _____________________________________________________________________  External Attachment:    Type:   Image     Comment:   External Document

## 2010-09-20 NOTE — Letter (Signed)
Summary: Medical Clearance for exercise program/[employer]  Medical Clearance for exercise program/[employer]   Imported By: Sherian Rein 10/18/2009 08:01:02  _____________________________________________________________________  External Attachment:    Type:   Image     Comment:   External Document

## 2010-09-20 NOTE — Letter (Signed)
Summary: [facility]  [facility]   Imported By: Lester [place] 07/04/2010 08:02:40  _____________________________________________________________________  External Attachment:    Type:   Image     Comment:   External Document

## 2010-09-20 NOTE — Assessment & Plan Note (Signed)
Summary: 3-4 week follow up-lb   Vital Signs:  Patient profile:   70 year old male Height:      75 inches Weight:      211 pounds BMI:     26.47 O2 Sat:      98 % on Room air Temp:     98.3 degrees F oral Pulse rate:   68 / minute BP sitting:   138 / 72  (left arm) Cuff size:   regular  Vitals Entered By: Bill Salinas CMA (July 21, 2010 10:34 AM)  O2 Flow:  Room air CC: ov to discuss testosterone and follow up since starting citalopram/ ab   Primary Care Provider:  Norins  CC:  ov to discuss testosterone and follow up since starting citalopram/ ab.  History of Present Illness: Patient returns for follow-up after start citalopram 10mg  one month ago. He has tolerated the medication well.He does see some decrease in vegative signs of depression. He has also seen Dr. Noe Gens at the cancer center.  Patient did have a testosterone level drawn which came back as low. Wishes to discuss.   Current Medications (verified): 1)  Crestor 20 Mg  Tabs (Rosuvastatin Calcium) .... Once Daily 2)  Lisinopril 20 Mg  Tabs (Lisinopril) .... Once Daily 3)  Niaspan 500 Mg  Tbcr (Niacin (Antihyperlipidemic)) .... Once Daily 4)  Avodart 0.5 Mg  Caps (Dutasteride) .... Take 1 Tablet By Mouth Every Morning 5)  Sonata 10 Mg  Caps (Zaleplon) .... At Bedtime As Needed 6)  Ibuprofen 200 Mg Caps (Ibuprofen) .... As Needed 7)  Melatonin 3 Mg Caps (Melatonin) .... As Needed 8)  Amlodipine Besylate 5 Mg Tabs (Amlodipine Besylate) .... 2  By Mouth Once Daily 9)  Prochlorperazine Edisylate 5 Mg/ml Soln (Prochlorperazine Edisylate) .Marland Kitchen.. 10 Mg Per Tube Every 6 Hours Prn 10)  Lorazepam 0.5 Mg Tabs (Lorazepam) .... 0.5mg  Per Tube Q 6 Hours As Needed 11)  Zofran Odt 8 Mg Tbdp (Ondansetron) .Marland Kitchen.. 1 Oral Q12 As Needed 12)  Preparation H 0.25-3-85.5 % Supp (Pe-Shark Liver Oil-Cocoa Buttr) .Marland Kitchen.. 1- 4 X A Day As Needed 13)  Hydrocodone-Acetaminophen 7.5-500 Mg/42ml Soln (Hydrocodone-Acetaminophen) .Marland Kitchen.. 1 Tp 2 Teaspoons  Every 4 To 6 Hours As Needed 14)  Citalopram Hydrobromide 10 Mg Tabs (Citalopram Hydrobromide) .Marland Kitchen.. 1 By Mouth Once Daily  Allergies (verified): No Known Drug Allergies PMH-FH-SH reviewed-no changes except otherwise noted  Review of Systems  The patient denies anorexia, fever, weight loss, weight gain, hoarseness, chest pain, dyspnea on exertion, prolonged cough, hemoptysis, abdominal pain, incontinence, muscle weakness, and unusual weight change.    Physical Exam  General:  Well-developed,well-nourished,in no acute distress; alert,appropriate and cooperative throughout examination Head:  normocephalic and atraumatic.   Eyes:  pupils equal and pupils round.   Lungs:  normal respiratory effort.   Heart:  normal rate and regular rhythm.   Psych:  Oriented X3, memory intact for recent and remote, normally interactive, good eye contact, and not anxious appearing.     Impression & Recommendations:  Problem # 1:  GRIEF REACTION (ICD-309.0) doing better and tolerating medication but he feels he could do better   Plan - increase citalopram to 20mg  once daily          continue to see Dr. Noe Gens           Problem # 2:  DECREASED LIBIDO (ICD-799.81) testosterone 303 (350-911). Discussed replacement options and the effects of medication.  At this time he does not feel he  would benefit form testosterone replacement.   Complete Medication List: 1)  Crestor 20 Mg Tabs (Rosuvastatin calcium) .... Once daily 2)  Lisinopril 20 Mg Tabs (Lisinopril) .... Once daily 3)  Niaspan 500 Mg Tbcr (Niacin (antihyperlipidemic)) .... Once daily 4)  Avodart 0.5 Mg Caps (Dutasteride) .... Take 1 tablet by mouth every morning 5)  Sonata 10 Mg Caps (Zaleplon) .... At bedtime as needed 6)  Ibuprofen 200 Mg Caps (Ibuprofen) .... As needed 7)  Melatonin 3 Mg Caps (Melatonin) .... As needed 8)  Amlodipine Besylate 5 Mg Tabs (Amlodipine besylate) .... 2  by mouth once daily 9)  Prochlorperazine Edisylate 5 Mg/ml  Soln (Prochlorperazine edisylate) .Marland Kitchen.. 10 mg per tube every 6 hours prn 10)  Lorazepam 0.5 Mg Tabs (Lorazepam) .... 0.5mg  per tube q 6 hours as needed 11)  Zofran Odt 8 Mg Tbdp (Ondansetron) .Marland Kitchen.. 1 oral q12 as needed 12)  Preparation H 0.25-3-85.5 % Supp (Pe-shark liver oil-cocoa buttr) .Marland Kitchen.. 1- 4 x a day as needed 13)  Hydrocodone-acetaminophen 7.5-500 Mg/11ml Soln (Hydrocodone-acetaminophen) .Marland Kitchen.. 1 tp 2 teaspoons every 4 to 6 hours as needed 14)  Citalopram Hydrobromide 20 Mg Tabs (Citalopram hydrobromide) .Marland Kitchen.. 1 by mouth once daily Prescriptions: CITALOPRAM HYDROBROMIDE 20 MG TABS (CITALOPRAM HYDROBROMIDE) 1 by mouth once daily  #90 x 3   Entered and Authorized by:   [name] MD   Signed by:   [name] MD on 07/21/2010   Method used:   Electronically to        [facility] [address] (retail)       [address]       [place], [place]  [address]       Ph: [phone]       Fax: [phone]   RxID:   [id number]    Orders Added: 1)  Est. Patient Level III [[address]]

## 2010-09-20 NOTE — Letter (Signed)
[facility]   Imported By: Sherian Rein 04/06/2010 11:28:47  _____________________________________________________________________  External Attachment:    Type:   Image     Comment:   External Document

## 2010-09-20 NOTE — Progress Notes (Signed)
Summary: lymph node-referral  Phone Note Call from Patient Call back at Home Phone [phone]   Summary of Call: Patient left message on triage that the lymph node has not gone down/changed and would like referral. Please advise. Initial call taken by: Lucious Groves,  Dec 22, 2009 9:56 AM  Follow-up for Phone Call        OK - will refer to Dr. Narda Bonds. [facility] notified. Follow-up by: [name] MD,  Dec 22, 2009 3:11 PM

## 2010-09-20 NOTE — Letter (Signed)
Summary: [facility]  [facility]   Imported By: Lester [place] 05/04/2010 11:01:15  _____________________________________________________________________  External Attachment:    Type:   Image     Comment:   External Document

## 2010-09-20 NOTE — Letter (Signed)
[facility]   Imported By: Lennie Odor 03/08/2010 15:00:39  _____________________________________________________________________  External Attachment:    Type:   Image     Comment:   External Document

## 2010-09-20 NOTE — Letter (Signed)
[facility]   Imported By: Lester [place] 01/12/2010 10:12:15  _____________________________________________________________________  External Attachment:    Type:   Image     Comment:   External Document

## 2010-09-20 NOTE — Assessment & Plan Note (Signed)
Summary: YEARLY FU/ [employer] MEDICARE/NWS #   Vital Signs:  Patient profile:   70 year old male Height:      73 inches Weight:      220 pounds BMI:     29.13 O2 Sat:      97 % on Room air Temp:     98.2 degrees F oral Pulse rate:   50 / minute BP sitting:   132 / 80  (left arm) Cuff size:   large  Vitals Entered By: Ami Bullins CMA (September 08, 2009 10:12 AM)  O2 Flow:  Room air CC: pt here for yearly physical/ ab  Vision Screening:      Vision Comments: last eye exam was in May of 2010   Primary Care Provider:  Mariyanna Mucha  CC:  pt here for yearly physical/ ab.  History of Present Illness: Phillip [name] presents for a general medical exam and follow-up. He has had a good interval since his last visit with no significant medical problems, surgeries or injuries. He is feeling well and remains very active and physically fit.   Current Medications (verified): 1)  Crestor 20 Mg  Tabs (Rosuvastatin Calcium) .... Once Daily 2)  Lisinopril 20 Mg  Tabs (Lisinopril) .... Once Daily 3)  Niaspan 500 Mg  Tbcr (Niacin (Antihyperlipidemic)) .... Once Daily 4)  Avodart 0.5 Mg  Caps (Dutasteride) .... Take 1 Tablet By Mouth Every Morning 5)  Sonata 10 Mg  Caps (Zaleplon) .... At Bedtime As Needed 6)  Furosemide 40 Mg Tabs (Furosemide) .Marland Kitchen.. 1 By Mouth Once Daily 7)  Ibuprofen 200 Mg Caps (Ibuprofen) .... As Needed 8)  Melatonin 3 Mg Caps (Melatonin) .... As Needed  Allergies (verified): No Known Drug Allergies  Past History:  Past Medical History: Last updated: 08/30/2007 NECK PAIN (ICD-723.1) CORONARY ARTERY DISEASE (ICD-414.00) HYPERTENSION (ICD-401.9) BENIGN PROSTATIC HYPERTROPHY, HX OF (ICD-V13.8) Cardiac-h/o PVCs. Had cardiac cath Oct '03 - nonobstructive disease with 40% LAD ostial, 30% proximal, 40%            distal; 30% ostial CFX; nl RCA. Normal LV function. Enrolled in the [place]ERO[place] trail w/ Crestor.  Past Surgical History: Last updated:  08/30/2007 Tonsillectomy Vasectomy Left knee arthroscopy Dental extractions  Family History: Reviewed history from 08/30/2007 and no changes required. father - deceased; CAD/MI @ 56 mother- deceased; DM, sick sinus syndrome Neg- colon and prostate cancer  Social History: Reviewed history from 09/04/2008 and no changes required. 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 Now resides on the [place]: plays golf, remains very active with an interest in politics. Is thinking of moving to the greater [facility] area.  Jan '11 moved to a villa outside [place].  Review of Systems  The patient denies anorexia, fever, weight loss, weight gain, decreased hearing, hoarseness, syncope, dyspnea on exertion, prolonged cough, abdominal pain, hematochezia, muscle weakness, difficulty walking, abnormal bleeding, and angioedema.    Physical Exam  General:  WNWD white male looking younger than his stated age. Head:  Normocephalic and atraumatic without obvious abnormalities. No apparent alopecia or balding. Eyes:  No corneal or conjunctival inflammation noted. EOMI. Perrla. Funduscopic exam benign, without hemorrhages, exudates or papilledema. Vision grossly normal. Ears:  External ear exam shows no significant lesions or deformities.  Otoscopic examination reveals clear canals, tympanic membranes are intact bilaterally without bulging, retraction, inflammation or discharge. Hearing is grossly normal bilaterally. Nose:  no external deformity  and no external erythema.   Mouth:  Oral mucosa and oropharynx without lesions or exudates.  Teeth in good repair. Neck:  supple, full ROM, no thyromegaly, and no carotid bruits.   Chest Wall:  No deformities, masses, tenderness or gynecomastia noted. Lungs:  Normal respiratory effort, chest expands symmetrically. Lungs are clear to  auscultation, no crackles or wheezes. Heart:  Normal rate and regular rhythm. S1 and S2 normal without gallop, murmur, click, rub or other extra sounds. Abdomen:  soft, non-tender, normal bowel sounds, no masses, no guarding, and no hepatomegaly.   Rectal:  No external abnormalities noted. Normal sphincter tone. No rectal masses or tenderness. Prostate:  Prostate gland firm and smooth, no enlargement, nodularity, tenderness, mass, asymmetry or induration. Msk:  normal ROM, no joint tenderness, no joint swelling, no joint warmth, and no redness over joints.   Pulses:  2+ radials and DP pulses Extremities:  No clubbing, cyanosis, edema, or deformity noted with normal full range of motion of all joints.   Neurologic:  No cranial nerve deficits noted. Station and gait are normal. Plantar reflexes are down-going bilaterally. DTRs are symmetrical throughout. Sensory, motor and coordinative functions appear intact. Skin:  turgor normal, color normal, no rashes, no suspicious lesions, no ulcerations, and no edema.   Cervical Nodes:  no anterior cervical adenopathy and no posterior cervical adenopathy.   Inguinal Nodes:  no R inguinal adenopathy and no L inguinal adenopathy.   Psych:  Oriented X3, memory intact for recent and remote, normally interactive, good eye contact, and not anxious appearing.     Impression & Recommendations:  Problem # 1:  CORONARY ARTERY DISEASE (ICD-414.00) Very stable with no chest pain or limitations in his activities.He remains current with cardiology. Cholesterol levels are extremely good on crestor.  Plan - follow-up with cardiology as instructed.            Continue all present meds.   His updated medication list for this problem includes:    Lisinopril 20 Mg Tabs (Lisinopril) ..... Once daily    Furosemide 40 Mg Tabs (Furosemide) .Marland Kitchen... 1 by mouth once daily  Problem # 2:  HYPERTENSION (ICD-401.9)  His updated medication list for this problem includes:     Lisinopril 20 Mg Tabs (Lisinopril) ..... Once daily    Furosemide 40 Mg Tabs (Furosemide) .Marland Kitchen... 1 by mouth once daily  BP today: 132/80 Prior BP: 142/80 (09/14/2008)  Labs Reviewed: K+: 3.8 (08/27/2009) Creat: : 0.9 (08/27/2009)     Good control on his present medications. He will continue the same.  Problem # 3:  BENIGN PROSTATIC HYPERTROPHY, HX OF (ICD-V13.8) Normal prostate exam and normal PSA. His nocturia is reasonably controlled.  Plan - continue Avodart  Problem # 4:  Preventive Health Care (ICD-V70.0) Unremarkable history and normal physical exam. Current with colorectal cancer screening with last colonoscopy in '07. Immunizations for tetnus, influenza and pneumonvax current. He says he has had Zostavax. He is current with opthalmology.  In summary - a very nice man who appears to be medically stable at this time. He will return in 1 year or as needed.   Complete Medication List: 1)  Crestor 20 Mg Tabs (Rosuvastatin calcium) .... Once daily 2)  Lisinopril 20 Mg Tabs (Lisinopril) .... Once daily 3)  Niaspan 500 Mg Tbcr (Niacin (antihyperlipidemic)) .... Once daily 4)  Avodart 0.5 Mg Caps (Dutasteride) .... Take 1 tablet by mouth every morning 5)  Sonata 10 Mg Caps (Zaleplon) .... At bedtime as needed 6)  Furosemide 40 Mg Tabs (Furosemide) .Marland Kitchen.. 1 by mouth once daily 7)  Ibuprofen 200 Mg Caps (Ibuprofen) .... As needed 8)  Melatonin 3 Mg Caps (Melatonin) .... As needed  Other Orders: Prescription Created Electronically [id number])  Patient: Phillip [name] Note: All result statuses are Final unless otherwise noted.  Tests: (1) Lipid Panel (LIPID)   Cholesterol               97 mg/dL                    5-784     ATP III Classification            Desirable:  < 200 mg/dL                    Borderline High:  200 - 239 mg/dL               High:  > = 240 mg/dL   Triglycerides             48.0 mg/dL                  6.9-629.5     Normal:  <150 mg/dL     Borderline High:  284 - 199  mg/dL   HDL                       13.24 mg/dL                 >40.10   VLDL Cholesterol          9.6 mg/dL                   2.7-25.3   LDL Cholesterol           48 mg/dL                    6-64  CHO/HDL Ratio:  CHD Risk                             2                    Men          Women     1/2 Average Risk     3.4          3.3     Average Risk          5.0          4.4     2X Average Risk          9.6          7.1     3X Average Risk          15.0          11.0                           Tests: (2) Hepatic/Liver Function Panel (HEPATIC)   Total Bilirubin           1.0 mg/dL                   4.0-3.4   Direct Bilirubin          0.2 mg/dL  0.0-0.3   Alkaline Phosphatase      49 U/L                      39-117   AST                       28 U/L                      0-37   ALT                       27 U/L                      0-53   Total Protein             7.1 g/dL                    1.6-1.0   Albumin                   4.2 g/dL                    9.6-0.4  Tests: (3) BMP (METABOL)   Sodium                    143 mEq/L                   135-145   Potassium                 3.8 mEq/L                   3.5-5.1   Chloride                  105 mEq/L                   96-112   Carbon Dioxide            30 mEq/L                    19-32   Glucose              [H]  102 mg/dL                   54-09   BUN                       15 mg/dL                    8-11   Creatinine                0.9 mg/dL                   9.1-4.7   Calcium                   9.4 mg/dL                   8.2-95.6   GFR                       88.68 mL/min                >60  Tests: (4) Prostate Specific Antigen (PSA)   PSA-Hyb  0.11 ng/mL                  0.10-4.00Prescriptions: SONATA 10 MG  CAPS (ZALEPLON) at bedtime as needed  #30 x 3   Entered and Authorized by:   [name] MD   Signed by:   [name] MD on 09/08/2009   Method used:   Handwritten   RxID:    [id number] FUROSEMIDE 40 MG TABS (FUROSEMIDE) 1 by mouth once daily  #90 x 3   Entered and Authorized by:   [name] MD   Signed by:   [name] MD on 09/08/2009   Method used:   Electronically to        [facility] [address] (retail)       [address], [address]       [place], [place]  [address]       Ph: [phone]       Fax: [phone]   RxID:   [id number] AVODART 0.5 MG  CAPS (DUTASTERIDE) Take 1 tablet by mouth every morning  #90 x 3   Entered and Authorized by:   [name] MD   Signed by:   [name] MD on 09/08/2009   Method used:   Electronically to        [facility] [address] (retail)       [address], [address]       [place], [place]  [address]       Ph: [phone]       Fax: [phone]   RxID:   [id number] NIASPAN 500 MG  TBCR (NIACIN (ANTIHYPERLIPIDEMIC)) once daily  #90 x 3   Entered and Authorized by:   [name] MD   Signed by:   [name] MD on 09/08/2009   Method used:   Electronically to        [facility] [address] (retail)       [address]       [place], [place]  [address]       Ph: [phone]       Fax: [phone]   RxID:   [id number] LISINOPRIL 20 MG  TABS (LISINOPRIL) once daily  #90 x 3   Entered and Authorized by:   [name] MD   Signed by:   [name] MD on 09/08/2009   Method used:   Electronically to        [facility] [address] (retail)       [address], [address]       [place], [place]  [address]       Ph: [phone]       Fax: [phone]   RxID:   [id number] CRESTOR 20 MG  TABS (ROSUVASTATIN CALCIUM) once daily  #90 x 3   Entered and Authorized by:   [name] MD   Signed by:   [name] MD on 09/08/2009   Method used:   Electronically to        [facility] [address] (retail)       [address]       [place]  [place], [place]  [address]       Ph: [phone]       Fax: [phone]   RxID:   [id number]    Preventive Care Screening  Last Flu Shot:    Date:  05/21/2009    Results:  given

## 2010-09-20 NOTE — Assessment & Plan Note (Signed)
Summary: POST HOSP /NWS   Vital Signs:  Patient profile:   70 year old male Height:      75 inches Weight:      201 pounds BMI:     25.21 O2 Sat:      95 % on Room air Temp:     98.2 degrees F oral Pulse rate:   79 / minute BP sitting:   112 / 80  (left arm) Cuff size:   regular  Vitals Entered By: Bill Salinas CMA (March 28, 2010 11:37 AM)  O2 Flow:  Room air CC: hosp follow up/ ab Comments Pt is not on the following medications Crestor niaspan sonata avodart ibuprofen melatonin  amlodipine  Pt needs refill on Lorazepam and Zofran [facility] to [facility] [place] [address]   Primary Care Provider:  Norins  CC:  hosp follow up/ ab.  History of Present Illness: Patient presents for hospital follow-up. He was admitted July 24-29 for diverticulitis. Reviewed hospital D/C summary, labs and CT. He had CT evidence of diverticulitis descending and sigmoid colon. He made a good recovery on flagyl and cipro. He had a low WBC due to chemotherapy. Since d/c/ his has done well with resolution of LLQ abdominal tenderness.  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 He has lost 22 lbs. He had a PEG for nutritional purposes and he is taking 27253 Kcal per day by PEG with 7 cans of jevity or osmolite daily. He does have loose stools. He does feel a little weak. He has xerostomia secondary to his therapy and continues to have some throat pain secondary to treatment. He has a good attitude and continues to exercise.   Current Medications (verified): 1)  Crestor 20 Mg  Tabs (Rosuvastatin Calcium) .... Once Daily 2)  Lisinopril 20 Mg  Tabs (Lisinopril) .... Once Daily 3)  Niaspan 500 Mg  Tbcr (Niacin (Antihyperlipidemic)) .... Once Daily 4)  Avodart 0.5 Mg  Caps (Dutasteride) .... Take 1 Tablet By Mouth Every Morning 5)  Sonata 10 Mg  Caps (Zaleplon) .... At Bedtime As Needed 6)  Ibuprofen 200 Mg Caps (Ibuprofen) .... As Needed 7)  Melatonin 3 Mg Caps (Melatonin) .... As  Needed 8)  Amlodipine Besylate 5 Mg Tabs (Amlodipine Besylate) .... 2  By Mouth Once Daily 9)  Cipro 500 Mg/33ml (10%) Susr (Ciprofloxacin) .... 500 Ml Per Tube Two Times A Day X 10 Days 10)  Metronidazole 500 Mg Tabs (Metronidazole) .... 500mg  Per Tube Every 8 Hours 11)  Prochlorperazine Edisylate 5 Mg/ml Soln (Prochlorperazine Edisylate) .Marland Kitchen.. 10 Mg Per Tube Every 6 Hours Prn 12)  Lorazepam 0.5 Mg Tabs (Lorazepam) .... 0.5mg  Per Tube Q 6 Hours As Needed 13)  Zofran 8 Mg Tabs (Ondansetron Hcl) .... Ever 12 Hours As Needed 14)  Preparation H 0.25-3-85.5 % Supp (Pe-Shark Liver Oil-Cocoa Buttr) .Marland Kitchen.. 1- 4 X A Day As Needed 15)  Hydrocodone-Acetaminophen 7.5-500 Mg/58ml Soln (Hydrocodone-Acetaminophen) .Marland Kitchen.. 1 Tp 2 Teaspoons Every 4 To 6 Hours As Needed  Allergies (verified): No Known Drug Allergies  Past History:  Past Medical History: CARCINOMA, SQUAMOUS CELL (ICD-199.1)-right tonsil DIARRHEA (ICD-787.91) ROUTINE GENERAL MEDICAL EXAM@HEALTH  CARE FACL (ICD-V70.0) SINUS BRADYCARDIA (ICD-427.81) NECK PAIN (ICD-723.1) CORONARY ARTERY DISEASE (ICD-414.00) HYPERTENSION (ICD-401.9) BENIGN PROSTATIC HYPERTROPHY, HX OF (ICD-V13.8) Cardiac-h/o PVCs. Had cardiac cath Oct '03 - nonobstructive disease with 40% LAD ostial, 30% proximal, 40%            distal; 30% ostial CFX; nl RCA. Normal LV  function. Enrolled in the ASTERO[employer] trail w/ Crestor.  Past Surgical History: Tonsillectomy Vasectomy Left knee arthroscopy Dental extractions PEG - July '11 secondary to xrt changes nect/throat  Review of Systems       The patient complains of weight loss.  The patient denies anorexia, fever, decreased hearing, chest pain, dyspnea on exertion, prolonged cough, abdominal pain, severe indigestion/heartburn, incontinence, muscle weakness, suspicious skin lesions, difficulty walking, unusual weight change, abnormal bleeding, and angioedema.    Physical Exam  General:  gaunt white male in no distress: obvious  weight loss about the head and neck. Head:  normocephalic and atraumatic.  Mild temporal wasting and sunken cheeks Eyes:  C&S clear Ears:  R ear normal and L ear normal.   Mouth:  dry mucus membranes Neck:  supple and full ROM.   Lungs:  normal respiratory effort.   Heart:  normal rate and regular rhythm.   Abdomen:  soft, non-tender, normal bowel sounds, no guarding, and no hepatomegaly.  PEG site is dressed and dry.    Impression & Recommendations:  Problem # 1:  CARCINOMA, SQUAMOUS CELL (ICD-199.1) Patient has completed XRT and chemo. He has lost 22 lbs. He still has a very sore mouth and ocassional N/V. He gets relief with ondansetron 8mg  with compazine and lorazepam.  Plan - continue with PEG feedings as directed           Ondansetron, compazine and lorazepam renewed   Problem # 2:  DIVERTICULITIS, ACUTE (ICD-562.11) Good recovery. No tenderness on exam. NO fever. He has complete cipro and flagyl  Plan - routine colonoscopy down the road.   Complete Medication List: 1)  Crestor 20 Mg Tabs (Rosuvastatin calcium) .... Once daily 2)  Lisinopril 20 Mg Tabs (Lisinopril) .... Once daily 3)  Niaspan 500 Mg Tbcr (Niacin (antihyperlipidemic)) .... Once daily 4)  Avodart 0.5 Mg Caps (Dutasteride) .... Take 1 tablet by mouth every morning 5)  Sonata 10 Mg Caps (Zaleplon) .... At bedtime as needed 6)  Ibuprofen 200 Mg Caps (Ibuprofen) .... As needed 7)  Melatonin 3 Mg Caps (Melatonin) .... As needed 8)  Amlodipine Besylate 5 Mg Tabs (Amlodipine besylate) .... 2  by mouth once daily 9)  Cipro 500 Mg/35ml (10%) Susr (Ciprofloxacin) .... 500 ml per tube two times a day x 10 days 10)  Metronidazole 500 Mg Tabs (Metronidazole) .... 500mg  per tube every 8 hours 11)  Prochlorperazine Edisylate 5 Mg/ml Soln (Prochlorperazine edisylate) .Marland Kitchen.. 10 mg per tube every 6 hours prn 12)  Lorazepam 0.5 Mg Tabs (Lorazepam) .... 0.5mg  per tube q 6 hours as needed 13)  Zofran Odt 8 Mg Tbdp (Ondansetron) .Marland Kitchen..  1 oral q12 as needed 14)  Preparation H 0.25-3-85.5 % Supp (Pe-shark liver oil-cocoa buttr) .Marland Kitchen.. 1- 4 x a day as needed 15)  Hydrocodone-acetaminophen 7.5-500 Mg/51ml Soln (Hydrocodone-acetaminophen) .Marland Kitchen.. 1 tp 2 teaspoons every 4 to 6 hours as needed Prescriptions: LORAZEPAM 0.5 MG TABS (LORAZEPAM) 0.5mg  per tube q 6 hours as needed  #60 x 5   Entered and Authorized by:   [name] MD   Signed by:   [name] MD on 03/28/2010   Method used:   Handwritten   RxID:   [id number] ZOFRAN ODT 8 MG TBDP (ONDANSETRON) 1 oral q12 as needed  #30 x 3   Entered and Authorized by:   [name] MD   Signed by:   [name] MD on 03/28/2010   Method used:   Electronically to  [facility] [address] (retail)       [address]       [place], [place]  [address]       Ph: [phone]       Fax: [phone]   RxID:   [id number]

## 2010-09-20 NOTE — Assessment & Plan Note (Signed)
Summary: no fever/men pt/diarrhea/vomiting/cd   Vital Signs:  Patient profile:   70 year old Phillip [name] Height:      75 inches Weight:      215.25 pounds BMI:     27.00 O2 Sat:      97 % on Room air Temp:     97.7 degrees F oral Pulse rate:   Phillip / minute BP sitting:   132 / 70  (left arm) Cuff size:   large  Vitals Entered ByZella Ball Ewing (October 29, 2009 2:50 PM)  O2 Flow:  Room air  CC: Diarrhea/RE   Primary Care Provider:  Norins  CC:  Diarrhea/RE.  History of Present Illness: here with 4 wks symptoms - had initial "cold"  like symptoms, complicated by diarrhea for some few days, then another cold like illness it seems soone after , then more diarrhea like illness over the past 2 days, with bloating, loss of appetitie, gas, distension, vomiting x 3, watery diarrhea ;  pepto bismol started 2 days ago not improving; has known "lesion " to the perirectal area with occasional blood with heavier wiping;  last PM about 3 am had to get up with more watery diarrhea and noted rather large spot of blood on the tissue only without pain ;  no bleeding since then;  Pt denies CP, sob, doe, wheezing, orthopnea, pnd, worsening LE edema, palps, dizziness or syncope   Pt denies new neuro symptoms such as headache, facial or extremity weakness   Has enjoyed good health for many years, no major chronic illnesses.  Wife wtihout similar illness this year;  had 'double flu shot" last fall;  no significnat GI probelms in the past - has rare heartburn at most;  in past 2 dayss has also felt warm at times and "chills in the hands"   but no high temps or chills or sweats.  last colonscopy 2007 - and he cannot remember when rec'd for f/u.  Today Has some "gas discomfort"  but o/w little significant pain to the abdomen.  No rash, muscle or joint pains, or n/v, f/c  or orthostasis today.    Problems Prior to Update: 1)  Diarrhea  (ICD-787.91) 2)  Routine General Medical Exam[facility]  (ICD-V70.0) 3)  Sinus  Bradycardia  (ICD-427.81) 4)  Neck Pain  (ICD-723.1) 5)  Coronary Artery Disease  (ICD-414.00) 6)  Hypertension  (ICD-401.9) 7)  Benign Prostatic Hypertrophy, Hx of  (ICD-V13.8)  Medications Prior to Update: 1)  Crestor 20 Mg  Tabs (Rosuvastatin Calcium) .... Once Daily 2)  Lisinopril 20 Mg  Tabs (Lisinopril) .... Once Daily 3)  Niaspan 500 Mg  Tbcr (Niacin (Antihyperlipidemic)) .... Once Daily 4)  Avodart 0.5 Mg  Caps (Dutasteride) .... Take 1 Tablet By Mouth Every Morning 5)  Sonata 10 Mg  Caps (Zaleplon) .... At Bedtime As Needed 6)  Furosemide 40 Mg Tabs (Furosemide) .Marland Kitchen.. 1 By Mouth Once Daily 7)  Ibuprofen 200 Mg Caps (Ibuprofen) .... As Needed 8)  Melatonin 3 Mg Caps (Melatonin) .... As Needed  Current Medications (verified): 1)  Crestor 20 Mg  Tabs (Rosuvastatin Calcium) .... Once Daily 2)  Lisinopril 20 Mg  Tabs (Lisinopril) .... Once Daily 3)  Niaspan 500 Mg  Tbcr (Niacin (Antihyperlipidemic)) .... Once Daily 4)  Avodart 0.5 Mg  Caps (Dutasteride) .... Take 1 Tablet By Mouth Every Morning 5)  Sonata 10 Mg  Caps (Zaleplon) .... At Bedtime As Needed 6)  Furosemide 40 Mg Tabs (Furosemide) .Marland KitchenMarland KitchenMarland Kitchen 1  By Mouth Once Daily 7)  Ibuprofen 200 Mg Caps (Ibuprofen) .... As Needed 8)  Melatonin 3 Mg Caps (Melatonin) .... As Needed 9)  Promethazine Hcl 25 Mg Tabs (Promethazine Hcl) .Marland Kitchen.. 1po Q 6 Hrs As Needed Nausea 10)  Lomotil 2.5-0.025 Mg Tabs (Diphenoxylate-Atropine) .Marland Kitchen.. 1 By Mouth As Needed Loose Stool (Max 8 Tabs Per Day)  Allergies (verified): No Known Drug Allergies  Past History:  Past Medical History: Last updated: 08/30/2007 NECK PAIN (ICD-723.1) CORONARY ARTERY DISEASE (ICD-414.00) HYPERTENSION (ICD-401.9) BENIGN PROSTATIC HYPERTROPHY, HX OF (ICD-V13.8) Cardiac-h/o PVCs. Had cardiac cath Oct '03 - nonobstructive disease with 40% LAD ostial, 30% proximal, 40%            distal; 30% ostial CFX; nl RCA. Normal LV function. [place]rolled in the [place] w/ Crestor.  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 Now resides on the [place]: plays golf, remains very active with an interest in politics. Is thinking of moving to the greater [facility] area.  Jan '11 moved to a villa outside [place].  Risk Factors: Alcohol Use: 0 (08/30/2007) Exercise: yes (08/30/2007)  Risk Factors: Smoking Status: never (06/24/2007)  Review of Systems       all otherwise negative per pt -    Physical Exam  General:  alert and well-developed.   Head:  normocephalic and atraumatic.   Eyes:  vision grossly intact, pupils equal, and pupils round.   Ears:  left tm mild erythema, right tm ok,  canals ok, sinus nontender Nose:  no external deformity and no nasal discharge.   Mouth:  pharyngeal erythema and fair dentition.   Neck:  supple and no masses.   Lungs:  normal respiratory effort and normal breath sounds.   Heart:  normal rate and regular rhythm.   Abdomen:  soft, non-tender, and normal bowel sounds.   Rectal:  deferred Msk:  no joint tenderness and no joint swelling.   Extremities:  no edema, no erythema  Skin:  color normal and no rashes.     Impression & Recommendations:  Problem # 1:  DIARRHEA (ICD-787.91)  prob viral ilness, but seems protracted in course;  exam benign, but will tx symptomatically with phenergan as needed, lomotil as needed, also check stool studies, and followup as needed, does not appear volume depleted today  Orders: T-Culture, Stool ([id number]) T-Stool for O&P ([id number]) T-Stool Giardia / Crypto- EIA ([id number])  His updated medication list for this problem includes:    Lomotil 2.5-0.025 Mg Tabs (Diphenoxylate-atropine) .Marland Kitchen... 1 by mouth  as needed loose stool (max 8 tabs per day)  Problem # 2:  HYPERTENSION (ICD-401.9)  His updated medication list for this problem includes:    Lisinopril 20 Mg Tabs (Lisinopril) ..... Once daily    Furosemide 40 Mg Tabs (Furosemide) .Marland Kitchen... 1 by mouth once daily  BP today: 132/70 Prior BP: 132/80 (09/08/2009)  Labs Reviewed: K+: 3.8 (08/27/2009) Creat: : 0.9 (08/27/2009)   Chol: 97 (08/27/2009)   HDL: 39.30 (08/27/2009)   LDL: 48 (08/27/2009)   TG: 48.0 (08/27/2009) stable overall by hx and exam, ok to continue meds/tx as is   Complete Medication List: 1)  Crestor 20 Mg Tabs (Rosuvastatin calcium) .... Once daily 2)  Lisinopril 20 Mg Tabs (Lisinopril) .... Once daily 3)  Niaspan 500 Mg  Tbcr (Niacin (antihyperlipidemic)) .... Once daily 4)  Avodart 0.5 Mg Caps (Dutasteride) .... Take 1 tablet by mouth every morning 5)  Sonata 10 Mg Caps (Zaleplon) .... At bedtime as needed 6)  Furosemide 40 Mg Tabs (Furosemide) .Marland Kitchen.. 1 by mouth once daily 7)  Ibuprofen 200 Mg Caps (Ibuprofen) .... As needed 8)  Melatonin 3 Mg Caps (Melatonin) .... As needed 9)  Promethazine Hcl 25 Mg Tabs (Promethazine hcl) .Marland Kitchen.. 1po q 6 hrs as needed nausea 10)  Lomotil 2.5-0.025 Mg Tabs (Diphenoxylate-atropine) .Marland Kitchen.. 1 by mouth as needed loose stool (max 8 tabs per day)  Patient Instructions: 1)  Please take all new medications as prescribed 2)  Continue all previous medications as before this visit  3)  Please go to the Lab in the basement for your tests today  4)  Please schedule an appointment with your primary doctor as needed Prescriptions: LOMOTIL 2.5-0.025 MG TABS (DIPHENOXYLATE-ATROPINE) 1 by mouth as needed loose stool (max 8 tabs per day)  #40 x 1   Entered and Authorized by:   Corwin Levins MD   Signed by:   Corwin Levins MD on 10/29/2009   Method used:   Print then Give to Patient   RxID:   [id number] PROMETHAZINE HCL 25 MG TABS (PROMETHAZINE HCL) 1po q 6 hrs as needed nausea  #40 x 1   Entered and  Authorized by:   Corwin Levins MD   Signed by:   Corwin Levins MD on 10/29/2009   Method used:   Print then Give to Patient   RxID:   [id number]

## 2010-09-20 NOTE — Progress Notes (Signed)
  Phone Note Outgoing Call   Reason for Call: Discuss lab or test results Summary of Call: plewase call patinet: testosterone level is low. Will discuss at next office visit.  Initial call taken by: [name] MD,  June 24, 2010 9:12 AM  Follow-up for Phone Call        informed pt  Follow-up by: Ami Bullins CMA,  June 25, 2010 1:00 PM

## 2010-09-20 NOTE — Assessment & Plan Note (Signed)
Summary: OV---STC   Vital Signs:  Patient profile:   70 year old male Height:      75 inches Weight:      210 pounds BMI:     26.34 O2 Sat:      96 % on Room air Temp:     98.4 degrees F oral Pulse rate:   70 / minute BP sitting:   138 / 72  (left arm) Cuff size:   regular  Vitals Entered By: Bill Salinas CMA (June 23, 2010 2:29 PM)  O2 Flow:  Room air CC: Pt here for office visit to discuss several issues after Seeing Dr Noe Gens. 1. pt has issues with having dry mouth and bad taste in his mouth (this being part of having cancer treatment) and states he cannot eat due to food getting stuck in his gums and continues to have tube feedings. 2. Pt has been seeing Dr Noe Gens for couciling and MD has suggested anxiety medication such as Lexapro 40mg . 3. pt would also like to discuss low sex drive and would poss like to discuss testosterone/ ab Comments Pt wanting to discuss Cipro, Metronidazole, prochlorperazine and lorazepam/ ab Pt is not taking Crestor, Niaspan, Amlodipine   Primary Care Provider:  Norins  CC:  Pt here for office visit to discuss several issues after Seeing Dr Noe Gens. 1. pt has issues with having dry mouth and bad taste in his mouth (this being part of having cancer treatment) and states he cannot eat due to food getting stuck in his gums and continues to have tube feedings. 2. Pt has been seeing Dr Noe Gens for couciling and MD has suggested anxiety medication such as Lexapro 40mg . 3. pt would also like to discuss low sex drive and would poss like to discuss testosterone/ ab.  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 He presents today for issues of adjustment and grief. He is seeing Dr. Noe Gens through the cancer center. He has many issues surrounding aging, mortality awareness, quality of life issues due to his persistent xerostomia and inability to eat along with change in taste. He is struggling to find meaning in his life and  survival. He does question surviving in a "disabled" state. He seems suicidal but without a concrete plan or real intent. He is reading the bible a lot and participating in bible study at his church. He intellectually is thankful for his "cure" and knows he has many things going for him. Dr. Noe Gens has suggested he be prescribed anxiolytic and/or antidepressamt/ He has talked with a friend who has had good results with lexapro.   Current Medications (verified): 1)  Crestor 20 Mg  Tabs (Rosuvastatin Calcium) .... Once Daily 2)  Lisinopril 20 Mg  Tabs (Lisinopril) .... Once Daily 3)  Niaspan 500 Mg  Tbcr (Niacin (Antihyperlipidemic)) .... Once Daily 4)  Avodart 0.5 Mg  Caps (Dutasteride) .... Take 1 Tablet By Mouth Every Morning 5)  Sonata 10 Mg  Caps (Zaleplon) .... At Bedtime As Needed 6)  Ibuprofen 200 Mg Caps (Ibuprofen) .... As Needed 7)  Melatonin 3 Mg Caps (Melatonin) .... As Needed 8)  Amlodipine Besylate 5 Mg Tabs (Amlodipine Besylate) .... 2  By Mouth Once Daily 9)  Cipro 500 Mg/47ml (10%) Susr (Ciprofloxacin) .... 500 Ml Per Tube Two Times A Day X 10 Days 10)  Metronidazole 500 Mg Tabs (Metronidazole) .... 500mg  Per Tube Every 8 Hours 11)  Prochlorperazine Edisylate 5 Mg/ml Soln (Prochlorperazine Edisylate) .Marland KitchenMarland KitchenMarland Kitchen  10 Mg Per Tube Every 6 Hours Prn 12)  Lorazepam 0.5 Mg Tabs (Lorazepam) .... 0.5mg  Per Tube Q 6 Hours As Needed 13)  Zofran Odt 8 Mg Tbdp (Ondansetron) .Marland Kitchen.. 1 Oral Q12 As Needed 14)  Preparation H 0.25-3-85.5 % Supp (Pe-Shark Liver Oil-Cocoa Buttr) .Marland Kitchen.. 1- 4 X A Day As Needed 15)  Hydrocodone-Acetaminophen 7.5-500 Mg/86ml Soln (Hydrocodone-Acetaminophen) .Marland Kitchen.. 1 Tp 2 Teaspoons Every 4 To 6 Hours As Needed  Allergies (verified): No Known Drug Allergies  Past History:  Past Medical History: Last updated: 03/28/2010 CARCINOMA, SQUAMOUS CELL (ICD-199.1)-right tonsil DIARRHEA (ICD-787.91) ROUTINE GENERAL MEDICAL EXAM@[facility] (ICD-V70.0) SINUS BRADYCARDIA  (ICD-427.81) NECK PAIN (ICD-723.1) CORONARY ARTERY DISEASE (ICD-414.00) HYPERTENSION (ICD-401.9) BENIGN PROSTATIC HYPERTROPHY, HX OF (ICD-V13.8) Cardiac-h/o PVCs. Had cardiac cath Oct '03 - nonobstructive disease with 40% LAD ostial, 30% proximal, 40%            distal; 30% ostial CFX; nl RCA. Normal LV function. Enrolled in the [place] trail w/ Crestor.  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 Now resides on the [place]: plays golf, remains very active with an interest in politics. Is thinking of moving to the greater [facility] area.  Jan '11 moved to a villa outside [place].  Review of Systems       The patient complains of anorexia, weight loss, and depression.  The patient denies decreased hearing, hoarseness, chest pain, dyspnea on exertion, prolonged cough, headaches, abdominal pain, severe indigestion/heartburn, muscle weakness, and enlarged lymph nodes.    Physical Exam  General:  Tall well nourished white male who is emotionally fragile. Lungs:  normal respiratory effort.   Heart:  normal rate and regular rhythm.   Neurologic:  alert & oriented X3, cranial nerves II-XII intact, and gait normal.   Skin:  turgor normal and color normal.   Psych:  Oriented X3, memory intact for recent and remote, normally interactive, good eye contact, dysphoric affect, depressed affect, tearful, and moderately anxious.     Impression & Recommendations:  Problem # 1:  GRIEF REACTION (ICD-309.0) Patinet  with a grief reaction to his cancer, his body changes and his sense of mortality. We had a long discussion.  Plan - continue seeing Dr. Noe Gens.           citalopram 10 mg once daily           follow-up in 3 weeks.  Problem # 2:  DECREASED LIBIDO (ICD-799.81) Pati8net concerned about loss of interest in sex. It may be a vegative sign of depression vs organic issue.  Plan - testosterone level   Addendum: Tests: (1) Testosterone, Total (TESTO)   Testosterone         [L]  303.98 ng/dL                161.09-604.54  Will discuss testosterone replacement at next office visit.   Complete Medication List: 1)  Crestor 20 Mg Tabs (Rosuvastatin calcium) .... Once daily 2)  Lisinopril 20 Mg Tabs (Lisinopril) .... Once daily 3)  Niaspan 500 Mg Tbcr (Niacin (antihyperlipidemic)) .... Once daily 4)  Avodart 0.5 Mg Caps (  Dutasteride) .... Take 1 tablet by mouth every morning 5)  Sonata 10 Mg Caps (Zaleplon) .... At bedtime as needed 6)  Ibuprofen 200 Mg Caps (Ibuprofen) .... As needed 7)  Melatonin 3 Mg Caps (Melatonin) .... As needed 8)  Amlodipine Besylate 5 Mg Tabs (Amlodipine besylate) .... 2  by mouth once daily 9)  Prochlorperazine Edisylate 5 Mg/ml Soln (Prochlorperazine edisylate) .Marland Kitchen.. 10 mg per tube every 6 hours prn 10)  Lorazepam 0.5 Mg Tabs (Lorazepam) .... 0.5mg  per tube q 6 hours as needed 11)  Zofran Odt 8 Mg Tbdp (Ondansetron) .Marland Kitchen.. 1 oral q12 as needed 12)  Preparation H 0.25-3-85.5 % Supp (Pe-shark liver oil-cocoa buttr) .Marland Kitchen.. 1- 4 x a day as needed 13)  Hydrocodone-acetaminophen 7.5-500 Mg/7ml Soln (Hydrocodone-acetaminophen) .Marland Kitchen.. 1 tp 2 teaspoons every 4 to 6 hours as needed 14)  Citalopram Hydrobromide 10 Mg Tabs (Citalopram hydrobromide) .Marland Kitchen.. 1 by mouth once daily  Other Orders: TLB-Testosterone, Total (84403-TESTO) Prescriptions: CITALOPRAM HYDROBROMIDE 10 MG TABS (CITALOPRAM HYDROBROMIDE) 1 by mouth once daily  #30 x 12   Entered and Authorized by:   [name] MD    Signed by:   [name] MD on 06/23/2010   Method used:   Electronically to        [facility] (retail)       [address], [address]       [place], [place]  [address]       Ph: [phone]       Fax: [phone]   RxID:   [id number]    Orders Added: 1)  TLB-Testosterone, Total [84403-TESTO] 2)  Est. Patient Level III [84132]

## 2010-09-20 NOTE — Letter (Signed)
[facility]   Imported By: Lennie Odor 02/16/2010 13:49:42  _____________________________________________________________________  External Attachment:    Type:   Image     Comment:   External Document

## 2010-09-20 NOTE — Letter (Signed)
[facility]   Imported By: Lester Blanco 03/22/2010 09:34:20  _____________________________________________________________________  External Attachment:    Type:   Image     Comment:   External Document

## 2010-09-20 NOTE — Letter (Signed)
Summary: [facility]  [facility]   Imported By: Lester [place] 05/18/2010 09:07:52  _____________________________________________________________________  External Attachment:    Type:   Image     Comment:   External Document

## 2010-09-20 NOTE — Progress Notes (Signed)
Summary: BP IS HIGH  Phone Note Call from Patient Call back at Home Phone [phone]   Caller: WALK IN SHEET Summary of Call: PT'S BP IS 178/109 BY PERSONAL BP CHECK.  HE THINKS THE MEDICINE HE IS ON IS NOT SUFFICIENT.  HIS PHARMACY IS [facility] [address] IN [place].  PT'S CELL IS [id number]. Initial call taken by: Hilarie Fredrickson,  February 02, 2010 8:51 AM  Follow-up for Phone Call        I called patient as soon as walk in sheet was recieved. He was added to today's scheduled and is being seen in office now.  Follow-up by: Lamar Sprinkles, CMA,  February 02, 2010 10:01 AM

## 2010-09-20 NOTE — Letter (Signed)
[facility]   Imported By: Lester [place]ec[place] 03/22/2010 09:35:56  _____________________________________________________________________  External Attachment:    Type:   Image     Comment:   External Document

## 2010-09-21 ENCOUNTER — Encounter: Payer: Self-pay | Admitting: Internal Medicine

## 2010-09-22 NOTE — Letter (Signed)
Summary: Chipper Herb MD/[facility]  Chipper Herb MD/[facility]   Imported By: Lester [place]rr[place] 09/07/2010 08:23:47  _____________________________________________________________________  External Attachment:    Type:   Image     Comment:   External Document

## 2010-09-22 NOTE — Assessment & Plan Note (Signed)
Summary: YEARLY  STC   Vital Signs:  Patient profile:   70 year old male Height:      75 inches Weight:      210 pounds BMI:     26.34 O2 Sat:      97 % on Room air Temp:     98.1 degrees F oral Pulse rate:   57 / minute BP sitting:   128 / 78  (left arm) Cuff size:   large  Vitals Entered By: Ami Bullins CMA (September 09, 2010 10:23 AM)  O2 Flow:  Room air CC: pt here for yearly/ ab Comments pt dc'd several medications Melatonin, amlodipine, lorazepam, preparation, hydrocodone and zofran/ ab   Primary Care Provider:  Shelaine Frie  CC:  pt here for yearly/ ab.  History of Present Illness: Patient presents for a general wellness exam. He has done very well with his treatment for tonsilar squamous cell carcinoma. He is still using his PEG tube for nutrition but is planning to wean off to 100% oral feeds. He in general has recovered much of his strenght and has been able to play 18 holes on alternate days.   He is 100% in all his ADLs. He has had no falls and no increased fall risk. He is cognitively - managing all his financial affiar and is very interactive with is community. He has had some situational/reactive depression along with some anxiety and is doing well with citalopram.   Preventive Screening-Counseling & Management  Alcohol-Tobacco     Alcohol drinks/day: 0     Smoking Status: never  Caffeine-Diet-Exercise     Caffeine use/day: none     Diet Comments: PEG feedings in transition to a oral supplement and heart healthy food choices as his taste returns     Does Patient Exercise: yes     Type of exercise: golfing     Exercise (avg: min/session): >60     Times/week: <3  Hep-HIV-STD-Contraception     Hepatitis Risk: no risk noted     HIV Risk: no risk noted     STD Risk: no risk noted     Dental Visit-last 6 months yes     TSE monthly: no     Sun Exposure-Excessive: no  Safety-Violence-Falls     Seat Belt Use: yes     Helmet Use: n/a     Firearms in the Home: no  firearms in the home     Smoke Detectors: yes     Violence in the Home: no risk noted     Sexual Abuse: no     Fall Risk: low fall risk      Sexual History:  currently monogamous.        Drug Use:  never.        Blood Transfusions:  no.    Current Medications (verified): 1)  Crestor 20 Mg  Tabs (Rosuvastatin Calcium) .... Once Daily 2)  Lisinopril 20 Mg  Tabs (Lisinopril) .... Once Daily 3)  Niaspan 500 Mg  Tbcr (Niacin (Antihyperlipidemic)) .... Once Daily 4)  Avodart 0.5 Mg  Caps (Dutasteride) .... Take 1 Tablet By Mouth Every Morning 5)  Sonata 10 Mg  Caps (Zaleplon) .... At Bedtime As Needed 6)  Ibuprofen 200 Mg Caps (Ibuprofen) .... As Needed 7)  Melatonin 3 Mg Caps (Melatonin) .... As Needed 8)  Amlodipine Besylate 5 Mg Tabs (Amlodipine Besylate) .... 2  By Mouth Once Daily 9)  Prochlorperazine Edisylate 5 Mg/ml Soln (Prochlorperazine Edisylate) .Marland Kitchen.. 10 Mg  Per Tube Every 6 Hours Prn 10)  Lorazepam 0.5 Mg Tabs (Lorazepam) .... 0.5mg  Per Tube Q 6 Hours As Needed 11)  Zofran Odt 8 Mg Tbdp (Ondansetron) .Marland Kitchen.. 1 Oral Q12 As Needed 12)  Preparation H 0.25-3-85.5 % Supp (Pe-Shark Liver Oil-Cocoa Buttr) .Marland Kitchen.. 1- 4 X A Day As Needed 13)  Hydrocodone-Acetaminophen 7.5-500 Mg/1ml Soln (Hydrocodone-Acetaminophen) .Marland Kitchen.. 1 Tp 2 Teaspoons Every 4 To 6 Hours As Needed 14)  Citalopram Hydrobromide 20 Mg Tabs (Citalopram Hydrobromide) .Marland Kitchen.. 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 Now resides on the [place]: plays golf, remains very active with an interest in politics. Is thinking of moving to the greater [facility] area.  Jan '11 moved to a villa outside [place].  Social History: Caffeine use/day:  none Dental Care w/in 6 mos.:  yes Sun Exposure-Excessive:  no Seat Belt Use:  yes Fall Risk:  low fall risk Blood Transfusions:  no Hepatitis Risk:  no risk noted HIV Risk:  no risk noted STD Risk:  no risk noted Sexual History:  currently monogamous Drug Use:  never  Review of Systems       The patient complains of decreased hearing.  The patient denies anorexia, fever, weight loss, weight gain, vision loss, hoarseness, chest pain, syncope, dyspnea on exertion, peripheral edema, prolonged cough, headaches, abdominal pain, severe indigestion/heartburn, suspicious skin lesions, difficulty walking, depression, and enlarged lymph nodes.         xerostomia affecting gums, some soreness to the gums. Taking stool softeners at bedtime and fiber to regulate bowels. Less regulated as he did. Nocturia x 3. Usually able to return to sleep without difficulth  Physical Exam  General:  alert, well-developed, well-nourished, well-hydrated, normal appearance, and cooperative to examination.   Head:  normocephalic and atraumatic.   Eyes:  vision grossly intact, pupils equal, pupils round, pupils react to accomodation, corneas and lenses clear, and no injection.   Ears:  R ear normal, L ear normal, and no external deformities.   Nose:  no external deformity, no external erythema, and no nasal discharge.   Mouth:  good dentition, no gingival abnormalities, no dental plaque, and pharynx pink and moist.  White coating to tongue, white deposits on buccal membranes, dry mucus membranes. Neck:  supple, full ROM, no thyromegaly, and no carotid bruits.   Chest Wall:  no deformities and no tenderness.   Lungs:  normal  respiratory effort, normal breath sounds, no crackles, and no wheezes.   Heart:  normal rate, regular rhythm, no murmur, and no JVD.   Abdomen:  soft, non-tender, normal bowel sounds, no guarding, and no rigidity.  PEG tube site upper left abdomen - dressing in place, clean and dry. Msk:  normal ROM, no joint tenderness, no joint swelling, no joint warmth, and no joint instability.   Pulses:  2+ radial and DP pulses Extremities:  No clubbing, cyanosis, edema, or deformity noted with normal full range of motion of all joints.   Neurologic:  alert & oriented X3, cranial  nerves II-XII intact, strength normal in all extremities, gait normal, and DTRs symmetrical and normal.   Skin:  turgor normal, color normal, no suspicious lesions, and no ulcerations.   Cervical Nodes:  no anterior cervical adenopathy and no posterior cervical adenopathy.   Psych:  Oriented X3, memory intact for recent and remote, normally interactive, good eye contact, not anxious appearing, and not depressed appearing.     Impression & Recommendations:  Problem # 1:  CARCINOMA, SQUAMOUS CELL (ICD-199.1) Reports from [facility] reviewed: Mr. Kluth has done very well with his treatment. Dr. Dayton Scrape has encouraged him to reduce dependency on PEG feedings. He does admit that there is a great deal of convience in using PEG. He does have have a plan to reduce PEG feeds and continue with a nutritious supplement by mouth which he likes.   Plan - convert to oral supplements and continue to try foods           careful watch of weight  Problem # 2:  CORONARY ARTERY DISEASE (ICD-414.00) 12 lead EKG with loss of R wave in V1V2 and mild fasicular block. Reviewed previous EKGs in EMR - no change in EKG. He has had cardiac cath and has non-obstructive CAD. He did participate in the asteroid trial with crestor and regression of plague.  Plan - continue risk reduction           during his illness (see #1) he has stopped anti-lipemic medications -  will check lipid panel with recommendations to follow.   His updated medication list for this problem includes:    Lisinopril 20 Mg Tabs (Lisinopril) ..... Once daily    Amlodipine Besylate 5 Mg Tabs (Amlodipine besylate) .Marland Kitchen... 2  by mouth once daily  Orders: TLB-Lipid Panel (80061-LIPID) TLB-Hepatic/Liver Function Pnl (80076-HEPATIC) TLB-TSH (Thyroid Stimulating Hormone) (84443-TSH)  Addendum - LDL 101 close to is on medication LDL of 92. HDL is low at 30.9  Plan - recheck lipids in 6 mnths.  Problem # 3:  HYPERTENSION (ICD-401.9)  His updated medication list for this problem includes:    Lisinopril 20 Mg Tabs (Lisinopril) ..... Once daily    Amlodipine Besylate 5 Mg Tabs (Amlodipine besylate) .Marland Kitchen... 2  by mouth once daily  Orders: TLB-BMP (Basic Metabolic Panel-BMET) (80048-METABOL)  BP today: 128/78 Prior BP: 138/72 (07/21/2010)  Good control on present medications.  Problem # 4:  Preventive Health Care (ICD-V70.0) Interval history as noted - he has done very well with treatment for head and neck cancer. He has a plan in regard to nutrition. PHysical exam is normal. Lab results: lipids as noted, remainder of labs are normal. PSA is 0-.07. He is current with colorectal cancer screening with last study Jan '07. Immunizations: Tetnus Jan '10; Pneumonia vaccine Nov [place]07; [place] He will be having routine eye exam in the near future.  In summary - a very nice man who has done very well with a life-changing illness. He currently appears medically stable and fit. He will return in 6 months for follow-up lipid panel. Otherwise he will return as needed.   Complete Medication List: 1)  Crestor 20 Mg Tabs (Rosuvastatin calcium) .... Once daily 2)  Lisinopril 20 Mg Tabs (Lisinopril) .... Once daily 3)  Niaspan 500 Mg Tbcr (Niacin (antihyperlipidemic)) .... Once daily 4)  Avodart 0.5 Mg Caps (Dutasteride) .... Take 1 tablet by mouth every morning 5)  Sonata  10 Mg Caps (Zaleplon) .... At bedtime as needed 6)  Ibuprofen  200 Mg Caps (Ibuprofen) .... As needed 7)  Melatonin 3 Mg Caps (Melatonin) .... As needed 8)  Amlodipine Besylate 5 Mg Tabs (Amlodipine besylate) .... 2  by mouth once daily 9)  Prochlorperazine Edisylate 5 Mg/ml Soln (Prochlorperazine edisylate) .Marland Kitchen.. 10 mg per tube every 6 hours prn 10)  Lorazepam 0.5 Mg Tabs (Lorazepam) .... 0.5mg  per tube q 6 hours as needed 11)  Zofran Odt 8 Mg Tbdp (Ondansetron) .Marland Kitchen.. 1 oral q12 as needed 12)  Preparation H 0.25-3-85.5 % Supp (Pe-shark liver oil-cocoa buttr) .Marland Kitchen.. 1- 4 x a day as needed 13)  Hydrocodone-acetaminophen 7.5-500 Mg/48ml Soln (Hydrocodone-acetaminophen) .Marland Kitchen.. 1 tp 2 teaspoons every 4 to 6 hours as needed 14)  Citalopram Hydrobromide 20 Mg Tabs (Citalopram hydrobromide) .Marland Kitchen.. 1 by mouth once daily  Other Orders: TLB-PSA (Prostate Specific Antigen) (84153-PSA) TLB-CBC Platelet - w/Differential (85025-CBCD) Medicare -1st Annual Wellness Visit [phone])  Patient: Phillip [name] Note: All result statuses are Final unless otherwise noted.  Tests: (1) BMP (METABOL)   Sodium                    141 mEq/L                   135-145   Potassium                 4.7 mEq/L                   3.5-5.1   Chloride                  103 mEq/L                   96-112   Carbon Dioxide            30 mEq/L                    19-32   Glucose              [H]  108 mg/dL                   60-45   BUN                       18 mg/dL                    4-09   Creatinine                0.9 mg/dL                   8.1-1.9   Calcium                   9.6 mg/dL                   1.4-78.2   GFR                       94.45 mL/min                >60.00  Tests: (2) Prostate Specific Antigen (PSA)   PSA-Hyb              [L]  0.07 ng/mL                  0.10-4.00  Tests: (3) Lipid Panel (LIPID)   Cholesterol  164 mg/dL                   1-610     ATP III Classification            Desirable:  < 200 mg/dL                     Borderline High:  200 - 239 mg/dL               High:  > = 240 mg/dL   Triglycerides        [H]  159.0 mg/dL                 9.6-045.4     Normal:  <150 mg/dL     Borderline High:  098 - 199 mg/dL   HDL                  [L]  11.91 mg/dL                 >47.82   VLDL Cholesterol          31.8 mg/dL                  9.5-62.1   LDL Cholesterol      [H]  308 mg/dL                   6-57  CHO/HDL Ratio:  CHD Risk                             5                    Men          Women     1/2 Average Risk     3.4          3.3     Average Risk          5.0          4.4     2X Average Risk          9.6          7.1     3X Average Risk          15.0          11.0                           Tests: (4) Hepatic/Liver Function Panel (HEPATIC)   Total Bilirubin           0.9 mg/dL                   8.4-6.9   Direct Bilirubin          0.1 mg/dL                   6.2-9.5   Alkaline Phosphatase      62 U/L                      39-117   AST                       24 U/L                      0-37   ALT  30 U/L                      0-53   Total Protein             7.0 g/dL                    7.8-4.6   Albumin                   4.5 g/dL                    9.6-2.9  Tests: (5) CBC Platelet w/Diff (CBCD)   White Cell Count          6.3 K/uL                    4.5-10.5   Red Cell Count            4.66 Mil/uL                 4.22-5.81   Hemoglobin                16.3 g/dL                   52.8-41.3   Hematocrit                46.2 %                      39.0-52.0   MCV                       99.1 fl                     78.0-100.0   MCHC                      35.2 g/dL                   24.4-01.0   RDW                       12.6 %                      11.5-14.6   Platelet Count            155.0 K/uL                  150.0-400.0   Neutrophil %         [H]  79.3 %                      43.0-77.0   Lymphocyte %         [L]  10.3 %                      12.0-46.0   Monocyte %                 7.6 %                       3.0-12.0   Eosinophils%              2.6 %  0.0-5.0   Basophils %               0.2 %                       0.0-3.0   Neutrophill Absolute      5.0 K/uL                    1.4-7.7   Lymphocyte Absolute  [L]  0.6 K/uL                    0.7-4.0   Monocyte Absolute         0.5 K/uL                    0.1-1.0  Eosinophils, Absolute                             0.2 K/uL                    0.0-0.7   Basophils Absolute        0.0 K/uL                    0.0-0.1  Tests: (6) TSH (TSH)   FastTSH                   2.89 uIU/mL                 0.35-5.50Prescriptions: SONATA 10 MG  CAPS (ZALEPLON) at bedtime as needed  #30 x 5   Entered and Authorized by:   [name] MD   Signed by:   [name] MD on 09/09/2010   Method used:   Handwritten   RxID:   [id number] CITALOPRAM HYDROBROMIDE 20 MG TABS (CITALOPRAM HYDROBROMIDE) 1 by mouth once daily  #90 x 3   Entered and Authorized by:   [name] MD   Signed by:   [name] MD on 09/09/2010   Method used:   Electronically to        [facility] [address] (retail)       [address], [address]       [place], [place]  [address]       Ph: [phone]       Fax: [phone]   RxID:   [id number] AVODART 0.5 MG  CAPS (DUTASTERIDE) Take 1 tablet by mouth every morning  #90 x 3   Entered and Authorized by:   [name] MD   Signed by:   [name] MD on 09/09/2010   Method used:   Electronically to        [facility] [address] (retail)       [address], [address]       [place], [place]  [address]       Ph: [phone]       Fax: [phone]   RxID:   [id number] LISINOPRIL 20 MG  TABS (LISINOPRIL) once daily  #90 x 3   Entered and Authorized by:   [name] MD   Signed by:   [name] MD on 09/09/2010   Method used:   Electronically to        [facility]  [address] (retail)       [address], [address]       [place], [place]  [address]       Ph: [phone]       Fax: [phone]   RxID:   [id number]    Orders Added: 1)  TLB-BMP (Basic Metabolic Panel-BMET) [80048-METABOL] 2)  TLB-PSA (Prostate Specific Antigen) [84153-PSA] 3)  TLB-Lipid Panel [80061-LIPID] 4)  TLB-Hepatic/Liver Function Pnl [80076-HEPATIC] 5)  TLB-CBC Platelet - w/Differential [85025-CBCD] 6)  TLB-TSH (Thyroid Stimulating Hormone) [84443-TSH] 7)  Medicare -1st Annual Wellness Visit [G0438]

## 2010-10-06 NOTE — Consult Note (Signed)
Summary: Narda Bonds MD, ENT  Narda Bonds MD, ENT   Imported By: Sherian Rein 09/30/2010 07:33:11  _____________________________________________________________________  External Attachment:    Type:   Image     Comment:   External Document

## 2010-10-12 ENCOUNTER — Ambulatory Visit (INDEPENDENT_AMBULATORY_CARE_PROVIDER_SITE_OTHER): Payer: 59 | Admitting: Psychiatry

## 2010-10-12 DIAGNOSIS — F0630 Mood disorder due to known physiological condition, unspecified: Secondary | ICD-10-CM

## 2010-10-13 ENCOUNTER — Other Ambulatory Visit: Payer: Self-pay | Admitting: Oncology

## 2010-10-13 ENCOUNTER — Encounter: Payer: Self-pay | Admitting: Internal Medicine

## 2010-10-13 ENCOUNTER — Encounter (HOSPITAL_BASED_OUTPATIENT_CLINIC_OR_DEPARTMENT_OTHER): Payer: MEDICARE | Admitting: Oncology

## 2010-10-13 DIAGNOSIS — E46 Unspecified protein-calorie malnutrition: Secondary | ICD-10-CM

## 2010-10-13 DIAGNOSIS — C099 Malignant neoplasm of tonsil, unspecified: Secondary | ICD-10-CM

## 2010-10-13 DIAGNOSIS — I10 Essential (primary) hypertension: Secondary | ICD-10-CM

## 2010-10-13 LAB — CBC WITH DIFFERENTIAL/PLATELET
Eosinophils Absolute: 0.2 10*3/uL (ref 0.0–0.5)
LYMPH%: 13.7 % — ABNORMAL LOW (ref 14.0–49.0)
MCHC: 34.9 g/dL (ref 32.0–36.0)
MCV: 97.9 fL (ref 79.3–98.0)
MONO%: 7.1 % (ref 0.0–14.0)
NEUT#: 2.9 10*3/uL (ref 1.5–6.5)
NEUT%: 72.7 % (ref 39.0–75.0)
Platelets: 151 10*3/uL (ref 140–400)
RBC: 4.4 10*6/uL (ref 4.20–5.82)

## 2010-10-13 LAB — COMPREHENSIVE METABOLIC PANEL
Alkaline Phosphatase: 60 U/L (ref 39–117)
Creatinine, Ser: 0.79 mg/dL (ref 0.40–1.50)
Glucose, Bld: 133 mg/dL — ABNORMAL HIGH (ref 70–99)
Sodium: 140 mEq/L (ref 135–145)
Total Bilirubin: 0.5 mg/dL (ref 0.3–1.2)
Total Protein: 6.4 g/dL (ref 6.0–8.3)

## 2010-11-01 NOTE — Letter (Signed)
Summary: [facility]  [facility]   Imported By: Lennie Odor 10/27/2010 09:40:55  _____________________________________________________________________  External Attachment:    Type:   Image     Comment:   External Document

## 2010-11-02 LAB — GLUCOSE, CAPILLARY: Glucose-Capillary: 122 mg/dL — ABNORMAL HIGH (ref 70–99)

## 2010-11-05 LAB — CBC
HCT: 30.2 % — ABNORMAL LOW (ref 39.0–52.0)
HCT: 36.8 % — ABNORMAL LOW (ref 39.0–52.0)
Hemoglobin: 10.2 g/dL — ABNORMAL LOW (ref 13.0–17.0)
Hemoglobin: 11 g/dL — ABNORMAL LOW (ref 13.0–17.0)
Hemoglobin: 12.7 g/dL — ABNORMAL LOW (ref 13.0–17.0)
Hemoglobin: 12.9 g/dL — ABNORMAL LOW (ref 13.0–17.0)
MCH: 33.7 pg (ref 26.0–34.0)
MCHC: 35.1 g/dL (ref 30.0–36.0)
MCHC: 35.3 g/dL (ref 30.0–36.0)
MCHC: 35.8 g/dL (ref 30.0–36.0)
MCV: 95.1 fL (ref 78.0–100.0)
Platelets: 101 10*3/uL — ABNORMAL LOW (ref 150–400)
Platelets: 109 10*3/uL — ABNORMAL LOW (ref 150–400)
Platelets: 87 10*3/uL — ABNORMAL LOW (ref 150–400)
Platelets: 89 10*3/uL — ABNORMAL LOW (ref 150–400)
RBC: 3.03 MIL/uL — ABNORMAL LOW (ref 4.22–5.81)
RBC: 3.73 MIL/uL — ABNORMAL LOW (ref 4.22–5.81)
RBC: 3.84 MIL/uL — ABNORMAL LOW (ref 4.22–5.81)
RDW: 12.8 % (ref 11.5–15.5)
WBC: 1.8 10*3/uL — ABNORMAL LOW (ref 4.0–10.5)
WBC: 2 10*3/uL — ABNORMAL LOW (ref 4.0–10.5)
WBC: 2.5 10*3/uL — ABNORMAL LOW (ref 4.0–10.5)
WBC: 4.8 10*3/uL (ref 4.0–10.5)

## 2010-11-05 LAB — DIFFERENTIAL
Basophils Relative: 0 % (ref 0–1)
Eosinophils Absolute: 0.1 10*3/uL (ref 0.0–0.7)
Lymphocytes Relative: 11 % — ABNORMAL LOW (ref 12–46)
Lymphs Abs: 0.2 10*3/uL — ABNORMAL LOW (ref 0.7–4.0)
Lymphs Abs: 0.3 10*3/uL — ABNORMAL LOW (ref 0.7–4.0)
Monocytes Absolute: 0.3 10*3/uL (ref 0.1–1.0)
Monocytes Relative: 16 % — ABNORMAL HIGH (ref 3–12)
Neutro Abs: 1.4 10*3/uL — ABNORMAL LOW (ref 1.7–7.7)
Neutro Abs: 1.8 10*3/uL (ref 1.7–7.7)
Neutrophils Relative %: 71 % (ref 43–77)
Neutrophils Relative %: 74 % (ref 43–77)

## 2010-11-05 LAB — URINALYSIS, ROUTINE W REFLEX MICROSCOPIC
Glucose, UA: NEGATIVE mg/dL
Hgb urine dipstick: NEGATIVE
Specific Gravity, Urine: 1.024 (ref 1.005–1.030)
Urobilinogen, UA: 1 mg/dL (ref 0.0–1.0)

## 2010-11-05 LAB — BILIRUBIN, FRACTIONATED(TOT/DIR/INDIR)
Bilirubin, Direct: 0.1 mg/dL (ref 0.0–0.3)
Indirect Bilirubin: 0.4 mg/dL (ref 0.3–0.9)

## 2010-11-05 LAB — BASIC METABOLIC PANEL
CO2: 28 mEq/L (ref 19–32)
CO2: 31 mEq/L (ref 19–32)
Calcium: 8.7 mg/dL (ref 8.4–10.5)
Calcium: 8.8 mg/dL (ref 8.4–10.5)
Chloride: 102 mEq/L (ref 96–112)
Creatinine, Ser: 0.9 mg/dL (ref 0.4–1.5)
GFR calc Af Amer: >60 mL/min (ref >60)
GFR calc Af Amer: >60 mL/min (ref >60)
GFR calc Af Amer: >60 mL/min (ref >60)
GFR calc non Af Amer: >60 mL/min (ref >60)
GFR calc non Af Amer: >60 mL/min (ref >60)
Glucose, Bld: 138 mg/dL — ABNORMAL HIGH (ref 70–99)
Potassium: 4.3 mEq/L (ref 3.5–5.1)
Sodium: 139 mEq/L (ref 135–145)
Sodium: 141 mEq/L (ref 135–145)
Sodium: 141 mEq/L (ref 135–145)

## 2010-11-05 LAB — COMPREHENSIVE METABOLIC PANEL
Albumin: 3.1 g/dL — ABNORMAL LOW (ref 3.5–5.2)
Alkaline Phosphatase: 34 U/L — ABNORMAL LOW (ref 39–117)
BUN: 11 mg/dL (ref 6–23)
Calcium: 8.7 mg/dL (ref 8.4–10.5)
Glucose, Bld: 125 mg/dL — ABNORMAL HIGH (ref 70–99)
Potassium: 4.1 mEq/L (ref 3.5–5.1)
Total Protein: 5.3 g/dL — ABNORMAL LOW (ref 6.0–8.3)

## 2010-11-05 LAB — GLUCOSE, CAPILLARY: Glucose-Capillary: 142 mg/dL — ABNORMAL HIGH (ref 70–99)

## 2010-11-05 LAB — CULTURE, BLOOD (ROUTINE X 2): Culture: NO GROWTH

## 2010-11-07 LAB — GLUCOSE, CAPILLARY: Glucose-Capillary: 115 mg/dL — ABNORMAL HIGH (ref 70–99)

## 2010-11-08 ENCOUNTER — Ambulatory Visit: Payer: MEDICARE | Attending: Radiation Oncology | Admitting: Radiation Oncology

## 2010-11-09 ENCOUNTER — Ambulatory Visit (INDEPENDENT_AMBULATORY_CARE_PROVIDER_SITE_OTHER): Payer: 59 | Admitting: Psychiatry

## 2010-11-09 DIAGNOSIS — F0630 Mood disorder due to known physiological condition, unspecified: Secondary | ICD-10-CM

## 2010-11-10 ENCOUNTER — Other Ambulatory Visit: Payer: Self-pay | Admitting: Radiation Oncology

## 2010-11-16 ENCOUNTER — Ambulatory Visit (HOSPITAL_COMMUNITY)
Admission: RE | Admit: 2010-11-16 | Discharge: 2010-11-16 | Disposition: A | Discharge status: Home / Self Care | Payer: MEDICARE | Source: Ambulatory Visit | Attending: Radiation Oncology | Admitting: Radiation Oncology

## 2010-11-16 DIAGNOSIS — Z431 Encounter for attention to gastrostomy: Secondary | ICD-10-CM | POA: Insufficient documentation

## 2010-11-16 DIAGNOSIS — C099 Malignant neoplasm of tonsil, unspecified: Secondary | ICD-10-CM | POA: Insufficient documentation

## 2010-11-17 ENCOUNTER — Other Ambulatory Visit: Payer: Self-pay | Admitting: Internal Medicine

## 2011-01-03 NOTE — Assessment & Plan Note (Signed)
[facility]                            CARDIOLOGY OFFICE NOTE   NAME:Tangney, ARNALDO                          MRN:          [id number]  DATE:09/04/2008                            DOB:          [birth year]    I was contacted today by Dr. Illene Regulus concerning Mr. Biggins.  The  patient apparently presented for routine physical examination.  His  heart rate was noted to be low and he therefore had an electrocardiogram  checked.  This showed a marked sinus bradycardia with occasional  junctional escape beats.  The rate was 34.  Note, the patient was  completely asymptomatic with no chest pain, shortness of breath, and no  recent presyncope, or syncope.  He is also on Toprol 50 mg p.o. daily.  We decided to discontinue the patient's Toprol completely.  He will  follow up with Dr. Debby Bud early next week.  We will then check his pulse  and blood pressure.  I also recommended that they increase his  lisinopril as his blood pressure is 140/80 and we will discontinue his  Toprol.  He may need the Holter monitor in the future depending what his  heart rate is.  Dr. Debby Bud was in agreement with this and we will see  him back next week.     Madolyn Frieze Jens Som, MD, [facility]  Electronically Signed    BSC/MedQ  DD: 09/04/2008  DT: 09/05/2008  Job #: [id number]

## 2011-01-06 NOTE — Cardiovascular Report (Signed)
Phillip [name], Phillip [name]                 ACCOUNT NO.:  [id number]   MEDICAL RECORD NO.:  [id number]          PATIENT TYPE:  OIB   LOCATION:  2899                         FACILITY:  [facility]   PHYSICIAN:  Charlies Constable, M.D. [facility] DATE OF BIRTH:  04-04-1941   DATE OF PROCEDURE:  06/16/2004  DATE OF DISCHARGE:  06/16/2004                              CARDIAC CATHETERIZATION   PROCEDURES PERFORMED:  Cardiac catheterization.   CARDIOLOGIST:  Charlies Constable, M.D.   CLINICAL HISTORY:  Phillip [name] is 70 years old, is retired and has been in  the [employer].  He was seen in the office and was scheduled for a  follow up research cath today.  He has had no symptoms of chest pain,  shortness of breath or palpitations.   DESCRIPTION OF PROCEDURE:  The procedure was performed via the right femoral  artery using arterial sheath and a 6 French preformed coronary catheter.  A  femoral arterial puncture was performed and Omnipaque contrast was used.  The patient was given weight-adjusted heparin following an ACT of greater  than 200 seconds.  We performed IVUS of the circumflex artery using a CLS-4  6 [place] guiding catheter with side holes and an [employer] soft wire.  We passed  the wire into the first marginal branch of the circumflex artery.  We used  an Atlantis IVUS catheter and passed the IVUS catheter about halfway down  the marginal branch, and did automatic pullback into the left main.  Final  diagnostic studies were performed through the guiding catheter.   The right femoral artery was closed with Perclose at the end of the  procedure.   The patient tolerated the procedure well and left the laboratory in  satisfactory condition.   RESULTS:   HEMODYNAMIC DATA:  The aortic pressure was 138/76 with a mean of 101.  Left  ventricular pressure was 138/20.   Left Main Coronary Artery:  The left main coronary artery had calcification.  There was 20% narrowing at the distal portion.   Left Anterior  Descending Artery:  The left anterior descending artery was  also heavily calcified.  The LAD gave rise to four septal perforators and  two diagonal branches.  There was 40% segmental narrowing in the ostium and  proximal portion of the vessel.  There was a second 40% lesion in the  proximal vessel.  There was 30% narrowing in the midvessel.   Circumflex Artery:  The circumflex artery was a dominant vessel.  It gave  rise to a small marginal branch, a large marginal branch, a posterolateral  branch and a posterior descending branch.  This vessel was also moderately  heavily calcified.  There was 30% narrowing in the ostium.  There was no  other major obstruction   Right Coronary Artery:  The right coronary artery is a moderate size vessel  that gave rise to two right ventricular branches.  These also were free of  significant disease.   VENTRICULOGRAPHIC DATA:  Left Ventriculogram:  The left ventriculogram  performed in the RAO projection showed good wall motion with no areas  of  hypokinesis.  The estimated ejection fraction was 60%.   INTRAVASCULAR ULTRASOUND STUDY:  The IVUS run showed a moderate amount of  plaque near the ostium of the marginal branch. The plaque filled about one-  third of  vessel.  Just proximal to the marginal branch there was 270  degrees of circumferential calcification.  In the very distal portion of the  left main there was 360 degrees of calcium.  In no area was the lumen  severely compromised.   CONCLUSION:  Coronary artery disease with 20% narrowing in the distal left  main coronary artery, 40% proximal and 30% mid stenosis in the left anterior  descending artery, 30% ostial stenosis in the circumflex artery, and no  significant disease in a nondominant right coronary artery with normal left  ventricular function.   RECOMMENDATIONS:  The patient has quite bit of plaque burden in his coronary  arteries, but none of these are obstructive and they do not  appear to have  significantly from two years ago.  We will plan continued medical therapy  and continued treatment with Crestor.  We will arrange follow up with Dr.  Myrtis Ser.       BB/MEDQ  D:  06/16/2004  T:  06/17/2004  Job:  [id number]   cc:   Willa Rough, M.D.   Rosalyn Gess Norins, M.D. [facility]   Cardiopulmonary Laboratory

## 2011-01-06 NOTE — Assessment & Plan Note (Signed)
[facility]                             PRIMARY CARE OFFICE NOTE   NAME:Phillip [name], Phillip [name]                          MRN:          [id number]  DATE:07/02/2006                            DOB:          1941-05-09    Phillip [name] is a delightful 70 year old gentleman who presents for followup  evaluation.  He was last seen in the office on June 28, 2005; please see  that complete dictation for past medical history, family history and social  history.   INTERVAL HISTORY:  1. The patient has had a good year.  He has had about a 12-pound weight      loss which he attributes to exercising on a regular basis, a prudent      diet and the occasional of Alli.   1. The patient had an upper respiratory infection in the Spring of 2007      with congestion and a hacking cough.  He was seen at an urgent care.      It was a prolonged illness with prolonged cough, but he has recovered.      He thinks he may be getting another cough.   1. Dermatology:  The patient has developed an erythematous large and firm      lesion under his right breast.  He was seen at Urgent Care and was      diagnosed with a furuncle.  He was treated with Septra b.i.d.,      Augmentin b.i.d. and Bactroban cream, although there was no open      lesion.  He report this is improving, but still remains erythematous      and firm.   REVIEW OF SYSTEMS:  Negative for any constitutional, cardiovascular,  respiratory, GI or GU complaints.   CURRENT MEDICATIONS:  1. Crestor 20 mg daily.  2. Lisinopril 20 mg daily.  3. Niaspan 500 mg daily.  4. Metoprolol 50 mg daily.  5. Avodart 0.5 mg daily.  6. Sonata 10 mg nightly p.r.n.   EXAMINATION:  Temperature was 99.5, blood pressure 106/61 and pulse was 41.  Weight is 219.  Height 6 feet 3 inches.  GENERAL APPEARANCE:  This is a tall, athletic-appearing gentleman looking  younger than his stated chronologic age in no acute distress.  HEENT:   Normocephalic, atraumatic.  EACs and TMs were normal.  Oropharynx  with native dentition in good repair.  No buccal or palatal lesions were  noted.  Posterior pharynx was clear.  Conjunctivae and sclerae were clear.  PERRLA.  EOMI.  Funduscopic exam was unremarkable.  NECK:  Supple without thyromegaly.  NODES:  No adenopathy was noted in the cervical, supraclavicular or axillary  regions.  CHEST:  No CVA tenderness.  LUNGS:  Clear to auscultation and percussion.  CARDIOVASCULAR:  Radial pulses 2+.  No JVD.  No carotid bruits.  He has  quiet precordium with a regular rate and rhythm without murmurs, rubs, or  gallops.  ABDOMEN:  Soft.  No guarding or rebound.  No organosplenomegaly was noted.  GENITALIA:  Normal male phallus.  Bilaterally  descended testicles without  masses.  RECTAL:  Normal sphincter tone was noted.  The prostate was smooth, round,  normal in size and contour without nodules.  EXTREMITIES:  Without clubbing, cyanosis, edema or deformity.  NEUROLOGIC:  Exam was nonfocal.  DERMATOLOGIC:  The patient has a 2 x 3-cm erythematous, firm, raised lesion  under his right breast.  It is not hot to the touch.  There is no drainage.   Laboratory from outside lab performed on June 15, 2006 with a hemoglobin  of 16.2 g; white count was 5000 with a normal differential, platelet count  146,000.  PSA was normal at 0.1.  Cholesterol was 93, triglycerides 59, LDL  was 51, HDL was 30.  Chemistries with a glucose of 106, creatinine of 0.9.  Electrolytes were all normal.  The patient had a mildly elevated total  bilirubin at 1.4.   CHART REVIEW:  Last colonoscopy performed in [facility] September 12, 2005  showed moderate diverticulosis of the sigmoid colon, otherwise normal to the  cecum, with the patient due for followup study in 10 years.  Last cath was  in 2003; please see that report.   Last stress Cardiolite was in 2003 with no definite ischemia.   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  He had  calcifications in the bilateral carotid bifurcation.  Last chest x-ray from  July 18, 2004 showed a small right pleural effusion, otherwise  unremarkable.   ASSESSMENT AND PLAN:  1. Coronary artery disease.  The patient is stable at this point.  He is      exercising on a regular basis.  He has had no chest pain or chest      discomfort.  2. Lipids:  The patient is doing extremely well and definitely at goal.      He will continue on Crestor.  3. Hypertension:  [name] blood pressure is excellent and he is      tolerating this very well.  He will continue on his present medication.  4. Benign prostatic hypertrophy:  The patient reports he is doing well      with no urinary frequency, urgency or nocturia and he will continue on      Avodart.  5. Occasional insomnia.  We discussed various drug options.  At this      point, I still recommend Sonata as drug of choice, given his needs,      even though it is not a low-tiered drug.  6. Dermatology:  Patient with a persistent boil.  It is non-fluctuant and      it is not hot.  There is no sign of surrounding erythema or infection.      Plan:  The patient is given a refill prescription for TMP-SMX DS to      take b.i.d. for 10 more days  He is told to apply warm compresses.  He      is not notify me if it does not improve.  7. Health maintenance:  The patient was given a pneumonia vaccine,      influenza vaccine and Zostavax at today's visit.   SUMMARY:  This is a delightful gentleman who is in good medical condition at  this time.  He will return to see me on a p.r.n. basis.  [place]  He has  established  with a gastroenterologist.  I will be happy to help manage  problems as they arise by phone.     Rosalyn Gess Norins, MD  Electronically Signed   MEN/MedQ  DD: 07/03/2006   DT: 07/03/2006  Job #: [id number]   cc:   [address] [place], [place], [place] [address] Phillip [name]

## 2011-01-11 ENCOUNTER — Other Ambulatory Visit (HOSPITAL_COMMUNITY): Payer: Medicare Other | Admitting: Dentistry

## 2011-01-11 DIAGNOSIS — R439 Unspecified disturbances of smell and taste: Secondary | ICD-10-CM

## 2011-01-11 DIAGNOSIS — K121 Other forms of stomatitis: Secondary | ICD-10-CM

## 2011-01-11 DIAGNOSIS — K1230 Oral mucositis (ulcerative), unspecified: Secondary | ICD-10-CM

## 2011-01-11 DIAGNOSIS — K0510 Chronic gingivitis, plaque induced: Secondary | ICD-10-CM

## 2011-01-11 DIAGNOSIS — K117 Disturbances of salivary secretion: Secondary | ICD-10-CM

## 2011-01-12 ENCOUNTER — Ambulatory Visit
Admission: RE | Admit: 2011-01-12 | Discharge: 2011-01-12 | Disposition: A | Discharge status: Home / Self Care | Payer: Medicare Other | Source: Ambulatory Visit | Attending: Radiation Oncology | Admitting: Radiation Oncology

## 2011-02-07 ENCOUNTER — Encounter (HOSPITAL_COMMUNITY): Payer: Self-pay

## 2011-02-07 ENCOUNTER — Encounter (HOSPITAL_BASED_OUTPATIENT_CLINIC_OR_DEPARTMENT_OTHER): Payer: Medicare Other | Admitting: Oncology

## 2011-02-07 ENCOUNTER — Other Ambulatory Visit: Payer: Self-pay | Admitting: Oncology

## 2011-02-07 ENCOUNTER — Ambulatory Visit (HOSPITAL_COMMUNITY)
Admission: RE | Admit: 2011-02-07 | Discharge: 2011-02-07 | Disposition: A | Discharge status: Home / Self Care | Payer: Medicare Other | Source: Ambulatory Visit | Attending: Oncology | Admitting: Oncology

## 2011-02-07 DIAGNOSIS — C099 Malignant neoplasm of tonsil, unspecified: Secondary | ICD-10-CM | POA: Insufficient documentation

## 2011-02-07 DIAGNOSIS — M47812 Spondylosis without myelopathy or radiculopathy, cervical region: Secondary | ICD-10-CM | POA: Insufficient documentation

## 2011-02-07 LAB — CMP (CANCER CENTER ONLY)
ALT(SGPT): 32 U/L (ref 10–47)
AST: 27 U/L (ref 11–38)
Albumin: 3.9 g/dL (ref 3.3–5.5)
CO2: 31 mEq/L (ref 18–33)
Calcium: 9.2 mg/dL (ref 8.0–10.3)
Chloride: 97 mEq/L — ABNORMAL LOW (ref 98–108)
Potassium: 4.7 mEq/L (ref 3.3–4.7)
Sodium: 141 mEq/L (ref 128–145)
Total Protein: 6.8 g/dL (ref 6.4–8.1)

## 2011-02-07 LAB — CBC WITH DIFFERENTIAL/PLATELET
BASO%: 0.4 % (ref 0.0–2.0)
HCT: 43.2 % (ref 38.4–49.9)
MCHC: 34.9 g/dL (ref 32.0–36.0)
MONO#: 0.4 10*3/uL (ref 0.1–0.9)
NEUT%: 66 % (ref 39.0–75.0)
RBC: 4.4 10*6/uL (ref 4.20–5.82)
RDW: 12.3 % (ref 11.0–14.6)
WBC: 3.9 10*3/uL — ABNORMAL LOW (ref 4.0–10.3)
lymph#: 0.7 10*3/uL — ABNORMAL LOW (ref 0.9–3.3)

## 2011-02-07 MED ORDER — IOHEXOL 300 MG/ML  SOLN
100.0000 mL | Freq: Once | INTRAMUSCULAR | Status: AC | PRN
  Administered 2011-02-07: 100 mL via INTRAVENOUS

## 2011-02-09 ENCOUNTER — Encounter (HOSPITAL_BASED_OUTPATIENT_CLINIC_OR_DEPARTMENT_OTHER): Payer: Medicare Other | Admitting: Oncology

## 2011-02-09 ENCOUNTER — Other Ambulatory Visit: Payer: Self-pay | Admitting: Oncology

## 2011-02-09 DIAGNOSIS — C099 Malignant neoplasm of tonsil, unspecified: Secondary | ICD-10-CM

## 2011-02-09 DIAGNOSIS — D696 Thrombocytopenia, unspecified: Secondary | ICD-10-CM

## 2011-02-09 DIAGNOSIS — I10 Essential (primary) hypertension: Secondary | ICD-10-CM

## 2011-02-09 DIAGNOSIS — E46 Unspecified protein-calorie malnutrition: Secondary | ICD-10-CM

## 2011-03-15 ENCOUNTER — Other Ambulatory Visit: Payer: Self-pay | Admitting: Internal Medicine

## 2011-03-15 ENCOUNTER — Ambulatory Visit
Admission: RE | Admit: 2011-03-15 | Discharge: 2011-03-15 | Disposition: A | Discharge status: Home / Self Care | Payer: Medicare Other | Source: Ambulatory Visit | Attending: Radiation Oncology | Admitting: Radiation Oncology

## 2011-03-15 DIAGNOSIS — I10 Essential (primary) hypertension: Secondary | ICD-10-CM

## 2011-03-16 ENCOUNTER — Other Ambulatory Visit (INDEPENDENT_AMBULATORY_CARE_PROVIDER_SITE_OTHER): Payer: Medicare Other

## 2011-03-16 DIAGNOSIS — I10 Essential (primary) hypertension: Secondary | ICD-10-CM

## 2011-03-16 LAB — LIPID PANEL: Cholesterol: 164 mg/dL (ref 0–200)

## 2011-03-19 ENCOUNTER — Encounter: Payer: Self-pay | Admitting: Internal Medicine

## 2011-03-29 ENCOUNTER — Ambulatory Visit: Payer: Self-pay | Admitting: Internal Medicine

## 2011-03-31 ENCOUNTER — Ambulatory Visit: Payer: Self-pay | Admitting: Internal Medicine

## 2011-04-17 ENCOUNTER — Encounter (HOSPITAL_BASED_OUTPATIENT_CLINIC_OR_DEPARTMENT_OTHER): Payer: Medicare Other | Admitting: Oncology

## 2011-04-17 ENCOUNTER — Other Ambulatory Visit: Payer: Self-pay | Admitting: Oncology

## 2011-04-17 DIAGNOSIS — C099 Malignant neoplasm of tonsil, unspecified: Secondary | ICD-10-CM

## 2011-04-17 LAB — CBC WITH DIFFERENTIAL/PLATELET
Basophils Absolute: 0 10*3/uL (ref 0.0–0.1)
Eosinophils Absolute: 0.1 10*3/uL (ref 0.0–0.5)
HGB: 15.9 g/dL (ref 13.0–17.1)
LYMPH%: 12 % — ABNORMAL LOW (ref 14.0–49.0)
MCV: 98.7 fL — ABNORMAL HIGH (ref 79.3–98.0)
MONO#: 0.5 10*3/uL (ref 0.1–0.9)
MONO%: 9 % (ref 0.0–14.0)
NEUT#: 4.7 10*3/uL (ref 1.5–6.5)
Platelets: 134 10*3/uL — ABNORMAL LOW (ref 140–400)
WBC: 6.1 10*3/uL (ref 4.0–10.3)

## 2011-05-17 ENCOUNTER — Ambulatory Visit: Payer: Medicare Other | Admitting: Radiation Oncology

## 2011-05-23 ENCOUNTER — Ambulatory Visit: Payer: Medicare Other | Admitting: Radiation Oncology

## 2011-06-06 ENCOUNTER — Ambulatory Visit
Admission: RE | Admit: 2011-06-06 | Discharge: 2011-06-06 | Disposition: A | Discharge status: Home / Self Care | Payer: Medicare Other | Source: Ambulatory Visit | Attending: Radiation Oncology | Admitting: Radiation Oncology

## 2011-07-23 ENCOUNTER — Other Ambulatory Visit: Payer: Self-pay | Admitting: Internal Medicine

## 2011-07-28 ENCOUNTER — Encounter: Payer: Self-pay | Admitting: *Deleted

## 2011-08-07 ENCOUNTER — Other Ambulatory Visit (HOSPITAL_BASED_OUTPATIENT_CLINIC_OR_DEPARTMENT_OTHER): Payer: 59 | Admitting: Lab

## 2011-08-07 ENCOUNTER — Other Ambulatory Visit (HOSPITAL_COMMUNITY): Payer: Self-pay

## 2011-08-07 ENCOUNTER — Other Ambulatory Visit: Payer: Self-pay | Admitting: Oncology

## 2011-08-07 ENCOUNTER — Ambulatory Visit (HOSPITAL_COMMUNITY)
Admission: RE | Admit: 2011-08-07 | Discharge: 2011-08-07 | Disposition: A | Discharge status: Home / Self Care | Payer: Medicare Other | Source: Ambulatory Visit | Attending: Oncology | Admitting: Oncology

## 2011-08-07 DIAGNOSIS — I658 Occlusion and stenosis of other precerebral arteries: Secondary | ICD-10-CM | POA: Insufficient documentation

## 2011-08-07 DIAGNOSIS — Z9221 Personal history of antineoplastic chemotherapy: Secondary | ICD-10-CM | POA: Insufficient documentation

## 2011-08-07 DIAGNOSIS — I6529 Occlusion and stenosis of unspecified carotid artery: Secondary | ICD-10-CM | POA: Insufficient documentation

## 2011-08-07 DIAGNOSIS — C099 Malignant neoplasm of tonsil, unspecified: Secondary | ICD-10-CM

## 2011-08-07 DIAGNOSIS — Z923 Personal history of irradiation: Secondary | ICD-10-CM | POA: Insufficient documentation

## 2011-08-07 DIAGNOSIS — K117 Disturbances of salivary secretion: Secondary | ICD-10-CM | POA: Insufficient documentation

## 2011-08-07 LAB — CBC WITH DIFFERENTIAL/PLATELET
Eosinophils Absolute: 0.2 10*3/uL (ref 0.0–0.5)
HCT: 46.2 % (ref 38.4–49.9)
LYMPH%: 15.9 % (ref 14.0–49.0)
MCV: 99.1 fL — ABNORMAL HIGH (ref 79.3–98.0)
MONO#: 0.2 10*3/uL (ref 0.1–0.9)
MONO%: 6.5 % (ref 0.0–14.0)
NEUT#: 2.5 10*3/uL (ref 1.5–6.5)
NEUT%: 72.4 % (ref 39.0–75.0)
Platelets: 121 10*3/uL — ABNORMAL LOW (ref 140–400)
RBC: 4.66 10*6/uL (ref 4.20–5.82)
WBC: 3.5 10*3/uL — ABNORMAL LOW (ref 4.0–10.3)

## 2011-08-07 LAB — CMP (CANCER CENTER ONLY)
Alkaline Phosphatase: 77 U/L (ref 26–84)
BUN, Bld: 17 mg/dL (ref 7–22)
CO2: 29 mEq/L (ref 18–33)
Creat: 1 mg/dl (ref 0.6–1.2)
Glucose, Bld: 150 mg/dL — ABNORMAL HIGH (ref 73–118)
Sodium: 142 mEq/L (ref 128–145)
Total Bilirubin: 0.6 mg/dl (ref 0.20–1.60)

## 2011-08-07 LAB — TSH: TSH: 4.507 u[IU]/mL — ABNORMAL HIGH (ref 0.350–4.500)

## 2011-08-07 MED ORDER — IOHEXOL 300 MG/ML  SOLN
100.0000 mL | Freq: Once | INTRAMUSCULAR | Status: AC | PRN
  Administered 2011-08-07: 100 mL via INTRAVENOUS

## 2011-08-10 ENCOUNTER — Ambulatory Visit (HOSPITAL_BASED_OUTPATIENT_CLINIC_OR_DEPARTMENT_OTHER): Payer: 59 | Admitting: Oncology

## 2011-08-10 VITALS — BP 140/72 | HR 60 | Temp 97.8°F | Ht 72.02 in | Wt 222.4 lb

## 2011-08-10 DIAGNOSIS — D696 Thrombocytopenia, unspecified: Secondary | ICD-10-CM

## 2011-08-10 DIAGNOSIS — E441 Mild protein-calorie malnutrition: Secondary | ICD-10-CM

## 2011-08-10 DIAGNOSIS — H918X9 Other specified hearing loss, unspecified ear: Secondary | ICD-10-CM

## 2011-08-10 DIAGNOSIS — C109 Malignant neoplasm of oropharynx, unspecified: Secondary | ICD-10-CM

## 2011-08-10 DIAGNOSIS — C801 Malignant (primary) neoplasm, unspecified: Secondary | ICD-10-CM

## 2011-08-10 NOTE — Progress Notes (Signed)
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  PAST THERAPY:  Every three week cisplatin and daily radiation therapy was started 01/31/2010.  He is status post two cycles of cisplatin.  He did not receive his third cycle due to progressive hearing loss and tinnitus.  His radiation was initiated on 01/31/2010 and concluded on 03/22/2010.  CURRENT THERAPY:  watchful observation.  INTERVAL HISTORY: Phillip [name] 70 y.o. male returns for regular follow with his wife.  He reports doing well.  However, he still has this aversion of eating solid foods.  He is able to drink 6 cans of Ensure daily and has maintained his weight.  He denies dysphagia, odynophagia, nausea/vomiting with solid foods.  It's just that he has no desire for solid foods.  He no longer needs the feeding tube.  He has good strength and plays golf with his friends twice a week.  He still has xerostomia.  He denies palpable neck nodes and nodes anywhere.   Patient denies fatigue, headache, visual changes, confusion, drenching night sweats, palpable lymph node swelling, mucositis, odynophagia, dysphagia, nausea vomiting, jaundice, chest pain, palpitation, shortness of breath, dyspnea on exertion, productive cough, gum bleeding, epistaxis, hematemesis, hemoptysis, abdominal pain, abdominal swelling, early satiety, melena, hematochezia, hematuria, skin rash, spontaneous bleeding, joint swelling, joint pain, heat or cold intolerance, bowel bladder incontinence, back pain, focal motor weakness, paresthesia, depression, suicidal or homocidal ideation, feeling hopelessness.   MEDICAL HISTORY: Past Medical History  Diagnosis Date  . Diarrhea   . Sinus bradycardia   . Neck pain   . Coronary artery disease   . Unspecified essential hypertension   . History of benign prostatic hypertrophy   . Squamous cell carcinoma     right tonsil     SURGICAL HISTORY:  Past Surgical History  Procedure Date  . Tonsillectomy   . Vasectomy   . Knee arthroscopy     left knee  . Dental surgery     extractions  . Peg placement     July 2011    MEDICATIONS: Current Outpatient Prescriptions  Medication Sig Dispense Refill  . aspirin 81 MG tablet Take 81 mg by mouth daily.        . citalopram (CELEXA) 20 MG tablet TAKE ONE TABLET BY MOUTH EVERY DAY  90 tablet  0  . docusate sodium (COLACE) 100 MG capsule Take 100 mg by mouth daily.        Marland Kitchen dutasteride (AVODART) 0.5 MG capsule Take 0.5 mg by mouth daily.        Marland Kitchen lisinopril (PRINIVIL,ZESTRIL) 20 MG tablet TAKE ONE TABLET BY MOUTH EVERY DAY  90 tablet  3  . Melatonin 3 MG CAPS Take by mouth as needed.        Marland Kitchen amLODipine (NORVASC) 5 MG tablet Take 5 mg by mouth 2 (two) times daily.        Marland Kitchen ibuprofen (ADVIL,MOTRIN) 200 MG tablet Take 200 mg by mouth as needed.        Marland Kitchen LORazepam (ATIVAN) 0.5 MG tablet Take 0.5 mg by mouth every 6 (six) hours as needed. 0.5 mg per tube every 6 hours prn       . niacin (NIASPAN) 500 MG CR tablet Take 500 mg by mouth at bedtime.        . ondansetron (ZOFRAN) 8 MG tablet Take by mouth every 12 (twelve) hours as  needed.        . phenylephrine-shark liver oil-mineral oil-petrolatum (PREPARATION H) 0.25-3-14-71.9 % rectal ointment Place rectally 2 (two) times daily as needed. 1 to 4 times a day as needed       . rosuvastatin (CRESTOR) 20 MG tablet Take 20 mg by mouth daily.        . zaleplon (SONATA) 10 MG capsule Take 10 mg by mouth at bedtime.          ALLERGIES:   has no known allergies.  REVIEW OF SYSTEMS:  The rest of the 14-point review of system was negative.   Filed Vitals:   08/10/11 1351  BP: 140/72  Pulse: 60  Temp: 97.8 F (36.6 C)   Wt Readings from Last 3 Encounters:  08/10/11 222 lb 6.4 oz (100.88 kg)  06/06/11 219 lb 3.2 oz (99.428 kg)  09/09/10 210 lb (95.255 kg)   ECOG Performance status: 0  PHYSICAL EXAMINATION:    General:  well-nourished in no acute distress.  Eyes:  no scleral icterus.  ENT:  There were no oropharyngeal lesions on my unaided exam.  Neck was without thyromegaly.  Lymphatics:  Negative cervical, supraclavicular or axillary adenopathy.  Respiratory: lungs were clear bilaterally without wheezing or crackles.  Cardiovascular:  Regular rate and rhythm, S1/S2, without murmur, rub or gallop.  There was no pedal edema.  GI:  abdomen was soft, flat, nontender, nondistended, without organomegaly.  Muscoloskeletal:  no spinal tenderness of palpation of vertebral spine.  Skin exam was without echymosis, petichae.  Neuro exam was nonfocal.  Patient was able to get on and off exam table without assistance.  Gait was normal.  Patient was alerted and oriented.  Attention was good.   Language was appropriate.  Mood was normal without depression.  Speech was not pressured.  Thought content was not tangential.     LABORATORY/RADIOLOGY DATA:  Lab Results  Component Value Date   WBC 3.5* 08/07/2011   HGB 16.1 08/07/2011   HCT 46.2 08/07/2011   PLT 121* 08/07/2011   GLUCOSE 150* 08/07/2011   CHOL 164 03/16/2011   TRIG 160.0* 03/16/2011   HDL 36.00* 03/16/2011   LDLCALC 96 03/16/2011   ALT 23 10/13/2010   ALT 23 10/13/2010   AST 25 08/07/2011   NA 142 08/07/2011   K 4.5 08/07/2011   CL 102 08/07/2011   CREATININE 1.0 08/07/2011   BUN 17 08/07/2011   CO2 29 08/07/2011   TSH 4.507* 08/07/2011   PSA 0.07* 09/09/2010   IMAGING:  I personally reviewed the CXR and CT neck which showed no evidence of recurrence locally or lung met.     Dg Chest 2 View  08/07/2011  *RADIOLOGY REPORT*  Clinical Data: Tonsillar carcinoma.  No current chest complaints. Nonsmoker  CHEST - 2 VIEW  Comparison: 07/18/2004  Findings: Heart and mediastinal contours are within normal limits. The lung fields appear clear with no signs of focal infiltrate or congestive failure.  No pleural fluid or significant peribronchial cuffing is  seen.  Bony structures appear intact.  IMPRESSION: Stable cardiopulmonary appearance with no new worrisome focal or acute abnormality seen.  Original Report Authenticated By: Bertha Stakes, M.D.   Ct Soft Tissue Neck W Contrast  08/07/2011  *RADIOLOGY REPORT*  Clinical Data: 70 year old male with history of tonsillar carcinoma treated with chemotherapy and radiation.  Dry mouth.  CT NECK WITH CONTRAST  Technique:  Multidetector CT imaging of the neck was performed with intravenous contrast.  Contrast:  OMNIPAQUE IOHEXOL 300 MG/ML IV SOLN  Comparison: 02/07/2011 and earlier.  Findings: Sequelae of XRT.  Stable diffuse thickening of the pharyngeal mucosal spaces.  No discrete pharyngeal mass or hyperenhancement.  Right level II lymphadenopathy remains resolved with stable residual nodal soft tissue measuring only 2 mm in short axis.  No new or increased cervical nodes.  Sublingual and parapharyngeal spaces remain normal.  Negative visualized orbit soft tissues, brain parenchyma, glottis, thyroid and superior mediastinum.  Major vascular structures are patent. Bilateral soft and calcified carotid atherosclerotic plaque re- identified.  Severe calcified atherosclerosis of the distal right vertebral artery.  Visualized paranasal sinuses and mastoids are clear.  Degenerative changes in the spine. No acute osseous abnormality identified. Negative visualized lungs except for minor atelectasis.  IMPRESSION: 1.  Stable and satisfactory post-therapy appearance of the neck. 2.  Left greater than right cervical carotid atherosclerosis.  Original Report Authenticated By: Harley Hallmark, M.D.    ASSESSMENT AND PLAN:   1. History of oropharyngeal squamous cell carcinoma:  I discussed with Phillip [name] and his wife that there is no evidence of any residual disease or metastatic disease on clinical history, physical exam, laboratory tests, or imaging modality.  I advised him to follow up with me in about 6 months  with CT neck the day prior.  This upcoming CT will be the last routine surveillance once since it'll be two years from the finish of therapy.   2. Thrombocytopenia:  Unclear etiology.  This is stable.  In the future, if his thrombocytopenia significantly worsens or he develops pancytopenia, then I may consider further work up.  3. 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  He has a hearing aid and it is helping him significantly.   4. Borderline diabetes:  Diet control per PCP.  5. Mild calorie protein malnutrition and aversions to food: no success despite numerous attempts, even with therapist.  He is content where he is.  He said that he would try solid food when he is mentally ready.  He refused any further discussion or intervention.  6. Anxiety and depression.  He is on citalopram with stable mood.  7. Hypertension.  He is on lisinopril per PCP.  8. Hyperlipidemia.  Diet control. 9. L>R carotid arteroslerosis:  I advised him to talk with his PCP to see if coratid [place] in patient with no history of CVA and no bruit on exam is indicated. 10. Follow up:  With me in about 6 months.  I advised him to follow up with ENT and Rad Onc as well.   The length of time of the face-to-face encounter was 30  minutes. More than 50% of time was spent counseling and coordination of care.

## 2011-08-21 ENCOUNTER — Other Ambulatory Visit: Payer: Self-pay | Admitting: *Deleted

## 2011-08-21 MED ORDER — DUTASTERIDE 0.5 MG PO CAPS: 0.5000 mg | ORAL_CAPSULE | Freq: Every day | ORAL | Status: DC

## 2011-09-05 ENCOUNTER — Other Ambulatory Visit: Payer: Self-pay | Admitting: Internal Medicine

## 2011-09-05 ENCOUNTER — Other Ambulatory Visit (INDEPENDENT_AMBULATORY_CARE_PROVIDER_SITE_OTHER): Payer: Self-pay

## 2011-09-05 DIAGNOSIS — Z87898 Personal history of other specified conditions: Secondary | ICD-10-CM

## 2011-09-05 DIAGNOSIS — Z125 Encounter for screening for malignant neoplasm of prostate: Secondary | ICD-10-CM

## 2011-09-05 DIAGNOSIS — E785 Hyperlipidemia, unspecified: Secondary | ICD-10-CM

## 2011-09-05 DIAGNOSIS — I2510 Atherosclerotic heart disease of native coronary artery without angina pectoris: Secondary | ICD-10-CM

## 2011-09-05 DIAGNOSIS — I10 Essential (primary) hypertension: Secondary | ICD-10-CM

## 2011-09-05 LAB — COMPREHENSIVE METABOLIC PANEL
ALT: 25 U/L (ref 0–53)
AST: 20 U/L (ref 0–37)
Alkaline Phosphatase: 56 U/L (ref 39–117)
BUN: 18 mg/dL (ref 6–23)
Calcium: 9.4 mg/dL (ref 8.4–10.5)
Chloride: 102 mEq/L (ref 96–112)
Creatinine, Ser: 0.8 mg/dL (ref 0.4–1.5)
Total Bilirubin: 0.6 mg/dL (ref 0.3–1.2)

## 2011-09-05 LAB — LIPID PANEL
Cholesterol: 166 mg/dL (ref 0–200)
HDL: 30.7 mg/dL — ABNORMAL LOW (ref >39.00)
Total CHOL/HDL Ratio: 5
Triglycerides: 264 mg/dL — ABNORMAL HIGH (ref 0.0–149.0)
VLDL: 52.8 mg/dL — ABNORMAL HIGH (ref 0.0–40.0)

## 2011-09-05 LAB — HEPATIC FUNCTION PANEL
AST: 20 U/L (ref 0–37)
Alkaline Phosphatase: 56 U/L (ref 39–117)
Bilirubin, Direct: 0.1 mg/dL (ref 0.0–0.3)
Total Protein: 6.3 g/dL (ref 6.0–8.3)

## 2011-09-05 LAB — CBC WITH DIFFERENTIAL/PLATELET
Basophils Absolute: 0 10*3/uL (ref 0.0–0.1)
Basophils Relative: 0.3 % (ref 0.0–3.0)
Eosinophils Absolute: 0.2 10*3/uL (ref 0.0–0.7)
MCHC: 35.3 g/dL (ref 30.0–36.0)
MCV: 100.1 fl — ABNORMAL HIGH (ref 78.0–100.0)
Monocytes Absolute: 0.6 10*3/uL (ref 0.1–1.0)
Neutro Abs: 3.7 10*3/uL (ref 1.4–7.7)
Neutrophils Relative %: 70.6 % (ref 43.0–77.0)
RBC: 4.51 Mil/uL (ref 4.22–5.81)
RDW: 12.4 % (ref 11.5–14.6)

## 2011-09-10 ENCOUNTER — Encounter: Payer: Self-pay | Admitting: Internal Medicine

## 2011-09-19 ENCOUNTER — Encounter: Payer: Self-pay | Admitting: Internal Medicine

## 2011-09-19 ENCOUNTER — Ambulatory Visit (INDEPENDENT_AMBULATORY_CARE_PROVIDER_SITE_OTHER): Payer: Medicare Other | Admitting: Internal Medicine

## 2011-09-19 VITALS — BP 102/58 | HR 64 | Temp 98.3°F | Resp 14 | Ht 73.5 in | Wt 220.5 lb

## 2011-09-19 DIAGNOSIS — I2510 Atherosclerotic heart disease of native coronary artery without angina pectoris: Secondary | ICD-10-CM

## 2011-09-19 DIAGNOSIS — Z87898 Personal history of other specified conditions: Secondary | ICD-10-CM

## 2011-09-19 DIAGNOSIS — Z1211 Encounter for screening for malignant neoplasm of colon: Secondary | ICD-10-CM

## 2011-09-19 DIAGNOSIS — I10 Essential (primary) hypertension: Secondary | ICD-10-CM

## 2011-09-19 DIAGNOSIS — Z0000 Encounter for general adult medical examination without abnormal findings: Secondary | ICD-10-CM

## 2011-09-19 DIAGNOSIS — E785 Hyperlipidemia, unspecified: Secondary | ICD-10-CM

## 2011-09-19 DIAGNOSIS — C801 Malignant (primary) neoplasm, unspecified: Secondary | ICD-10-CM

## 2011-09-19 DIAGNOSIS — R197 Diarrhea, unspecified: Secondary | ICD-10-CM

## 2011-09-19 MED ORDER — LISINOPRIL 20 MG PO TABS: 20.0000 mg | ORAL_TABLET | Freq: Every day | ORAL | Status: DC

## 2011-09-19 NOTE — Progress Notes (Signed)
Subjective:    Patient ID: Phillip [name], male    DOB: Dec 22, 1940, 71 y.o.   MRN: [id number]  HPI The patient is here for annual Medicare wellness examination and management of other chronic and acute problems. He has been doing quite well and has made a good recovery from his head and neck cancer. He has no active medical complaints.    The risk factors are reflected in the social history.  The roster of all physicians providing medical care to patient - is listed in the Snapshot section of the chart.  Activities of daily living:  The patient is 100% inedpendent in all ADLs: dressing, toileting, feeding as well as independent mobility. He continues to take liquid nutrition due to xerostomia, loss of taste and difficulty manipulating bolus of food.   Home safety : The patient has smoke detectors in the home. Fall - house is fall-safe, no grab bars or high commode. They wear seatbelts. No firearms at home. There is no violence in the home.   There is no risks for hepatitis, STDs or HIV. There is no   history of blood transfusion. They have no travel history to infectious disease endemic areas of the world.  The patient has seen their dentist in the last six month. They have not seen their eye doctor in the last year. They admit to hearing difficulty and has had audiologic testing in the last year.  They do not  have excessive sun exposure. Discussed the need for sun protection: hats, long sleeves and use of sunscreen if there is significant sun exposure.   Diet: the importance of a healthy diet is discussed. They do have liquid diet -  8 cans of ensure Plus with fiber daily.  The patient has a regular exercise program: walks ,  15-+ minutes duration,  5 per week.  The benefits of regular aerobic exercise were discussed.  Depression screen: there are no signs or vegative symptoms of depression- irritability, change in appetite, anhedonia, sadness/tearfullness. Interested in stopping  SSRI  Cognitive assessment: the patient manages all their financial and personal affairs and is actively engaged.   performed clock-face test normally.  The following portions of the patient's history were reviewed and updated as appropriate: allergies, current medications, past family history, past medical history,  past surgical history, past social history  and problem list.  Vision, hearing, body mass index were assessed and reviewed.   During the course of the visit the patient was educated and counseled about appropriate screening and preventive services including : fall prevention , diabetes screening, nutrition counseling, colorectal cancer screening, and recommended immunizations.    Review of Systems Constitutional:  Negative for fever, chills, activity change and unexpected weight change.  HEENT:  Negative for hearing loss, ear pain, congestion, neck stiffness and postnasal drip. Negative for sore throat or swallowing problems. Negative for dental complaints.   Eyes: Negative for vision loss or change in visual acuity.  Respiratory: Negative for chest tightness and wheezing. Negative for DOE.   Cardiovascular: SSCP clears with rest, feels like a stabbing pain. Duration 5 min. Not exertional in nature.  No decreased exercise tolerance Gastrointestinal: Difficult bowel habit with change in schedule and regularity. Using colace q PM. No bloating or gas. Possible mild reflux. Genitourinary: Negative for urgency, frequency, flank pain and difficulty urinating. Nocturia x 2, increase attributed to increase in water intake.  Musculoskeletal: Negative for myalgias, back pain, arthralgias and gait problem.  Neurological: Negative for dizziness, tremors, weakness and  headaches.  Hematological: Negative for adenopathy.  Psychiatric/Behavioral: Negative for behavioral problems and dysphoric mood. Change in sleep habit: retires 22:30 - 0830.      Objective:   Physical Exam Filed Vitals:    09/19/11 0913  BP: 102/58  Pulse: 64  Temp: 98.3 F (36.8 C)  Resp: 14   Gen'l: Well nourished well developed white male in no acute distress  HEENT: Head: Normocephalic and atraumatic. Right Ear: External ear normal. Hearing aids in place. Nose: Nose normal. Mouth/Throat: Oropharyx dry mucus membranes. Dentition - native, in good repair. No buccal or palatal lesions. Eyes: Conjunctivae and sclera clear. EOM intact. Pupils are equal, round, and reactive to light. Right eye exhibits no discharge. Left eye exhibits no discharge. Neck: Normal range of motion. Neck supple. No JVD present. No tracheal deviation present. No thyromegaly present.  Cardiovascular: Normal rate, regular rhythm, no gallop, no friction rub, no murmur heard.      Quiet precordium. 2+ radial and DP pulses . No carotid bruits Pulmonary/Chest: Effort normal. No respiratory distress or increased WOB, no wheezes, no rales. No chest wall deformity or CVAT. Abdominal: Soft. Bowel sounds are normal in all quadrants. He exhibits no distension, no tenderness, no rebound or guarding, No heptosplenomegaly  Genitourinary: deferred  Musculoskeletal: Normal range of motion. He exhibits no edema and no tenderness.       Small and large joints without redness, synovial thickening or deformity. Full range of motion preserved about all small, median and large joints.  Lymphadenopathy:    He has no cervical or supraclavicular adenopathy.  Neurological: He is alert and oriented to person, place, and time. CN II-XII intact. DTRs 2+ and symmetrical biceps, radial and patellar tendons. Cerebellar function normal with no tremor, rigidity, normal gait and station.  Skin: Skin is warm and dry. No rash noted. No erythema.  Psychiatric: He has a normal mood and affect. His behavior is normal. Thought content normal.   Lab Results  Component Value Date   WBC 5.3 09/05/2011   HGB 15.9 09/05/2011   HCT 45.1 09/05/2011   PLT 143.0* 09/05/2011   GLUCOSE  109* 09/05/2011   CHOL 166 09/05/2011   TRIG 264.0* 09/05/2011   HDL 30.70* 09/05/2011   LDLDIRECT 103.4 09/05/2011   LDLCALC 96 03/16/2011   ALT 25 09/05/2011   ALT 25 09/05/2011   AST 20 09/05/2011   AST 20 09/05/2011   NA 139 09/05/2011   K 4.6 09/05/2011   CL 102 09/05/2011   CREATININE 0.8 09/05/2011   BUN 18 09/05/2011   CO2 30 09/05/2011   TSH 4.507* 08/07/2011   PSA 0.13 09/05/2011          Assessment & Plan:

## 2011-09-21 DIAGNOSIS — Z0000 Encounter for general adult medical examination without abnormal findings: Secondary | ICD-10-CM | POA: Insufficient documentation

## 2011-09-21 NOTE — Assessment & Plan Note (Signed)
Interval history is benign. Physical exam is normal except for xerostomia. Lab results are in normal range, although LDL is above goal. Colorectal cancer screening - will need to pull old chart. He may be due for screening colonoscopy. Immunizations are current and up to date.  In summary - a very nice man who is medically stable and doing well. He will return as needed or in 1 year.

## 2011-09-21 NOTE — Assessment & Plan Note (Signed)
Now with irregular bowel habit but not to the point of requiring any medical treatment.

## 2011-09-21 NOTE — Assessment & Plan Note (Signed)
Lab Results  Component Value Date   CHOL 166 09/05/2011   HDL 30.70* 09/05/2011   LDLCALC 96 03/16/2011   LDLDIRECT 103.4 09/05/2011   TRIG 264.0* 09/05/2011   CHOLHDL 5 09/05/2011  not on statin medication at this time. HIs LDL is above goal of 80 for a patient with known, though minor non-obstructive, CAD  Plan - can consider revisiting Statin medication at future visit.

## 2011-09-21 NOTE — Assessment & Plan Note (Signed)
Stable with controlled symptoms on Avodart

## 2011-09-21 NOTE — Assessment & Plan Note (Signed)
BP Readings from Last 3 Encounters:  09/19/11 102/58  08/10/11 140/72  06/06/11 113/66   Very good control on present regimen.

## 2011-09-21 NOTE — Assessment & Plan Note (Signed)
Continues to be in remission. Follow-up scans have been negative. He continues to do well with the sequelae of treatment including loss of taste and xerostomia.

## 2011-09-21 NOTE — Assessment & Plan Note (Signed)
Stable with no anginal symptoms. He has no limitations in his activities.

## 2011-09-26 ENCOUNTER — Encounter: Payer: Self-pay | Admitting: Radiation Oncology

## 2011-09-26 ENCOUNTER — Ambulatory Visit
Admission: RE | Admit: 2011-09-26 | Discharge: 2011-09-26 | Disposition: A | Discharge status: Home / Self Care | Payer: Medicare Other | Source: Ambulatory Visit | Attending: Radiation Oncology | Admitting: Radiation Oncology

## 2011-09-26 VITALS — BP 121/75 | HR 70 | Temp 98.0°F | Resp 18 | Wt 221.8 lb

## 2011-09-26 DIAGNOSIS — C801 Malignant (primary) neoplasm, unspecified: Secondary | ICD-10-CM

## 2011-09-26 NOTE — Progress Notes (Signed)
Patient presents to the clinic today unaccompanied for a scheduled follow up appointment with Dr. Dayton Scrape. Patient is alert and oriented to person, place, and time. No distress noted. Steady gait noted. Pleasant affect noted. Patient denies pain at this time. Patient reports that dry mouth continues. Patient states," I don't eat I drink my nutrition." Patient scheduled to follow up with Dr. Ezzard Standing early July following CT scan with Dr. Gaylyn Rong in June. Patient reports that he had his annual physical last week and will soon begin tapering off Celexa. Also, patient reports that he saw his dentist, Dr. Mathews Robinsons, the second week in January. Reported all findings to Dr. Dayton Scrape.

## 2011-09-26 NOTE — Progress Notes (Signed)
Followup note:  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 He still maintains his nutrition with Ensure by mouth he though he does not have any dysphagia. His weight remained stable and remains active playing golf. He saw Dr. Ezzard Standing 2 weeks ago we will see him for a followup visit in July. He had an annual physical with Dr Arthur Holms in the recent past. A CT scan of the neck through Dr. Gaylyn Rong on 08/07/2011 was without evidence for recurrent disease. He also maintains his dental followup every 3 months.  Physical examination: He appears well. Nodes: Without palpable lymphadenopathy in the neck. Oral cavity and oropharynx remarkable for moderate trismus and moderate xerostomia. No visible or palpable evidence for recurrent disease along his right oropharynx. Indirect mirror examination confirmatory.  Impression: No evidence for recurrent disease. Again, he is not interested in advancing his nutrition to solid foods.  Plan: Since he'll see Dr. Ezzard Standing in July I'll see the patient back for a followup visit in April. He'll be 2 years out from his treatment in August of this year.

## 2011-10-16 ENCOUNTER — Encounter: Payer: Self-pay | Admitting: Internal Medicine

## 2011-11-22 ENCOUNTER — Ambulatory Visit (AMBULATORY_SURGERY_CENTER): Payer: Medicare Other | Admitting: *Deleted

## 2011-11-22 VITALS — Ht 75.0 in | Wt 223.1 lb

## 2011-11-22 DIAGNOSIS — Z1211 Encounter for screening for malignant neoplasm of colon: Secondary | ICD-10-CM

## 2011-11-22 MED ORDER — PEG-KCL-NACL-NASULF-NA ASC-C 100 G PO SOLR: 1.0000 | Freq: Once | ORAL | Status: DC

## 2011-11-28 ENCOUNTER — Telehealth: Payer: Self-pay

## 2011-11-28 MED ORDER — TADALAFIL 20 MG PO TABS: 20.0000 mg | ORAL_TABLET | Freq: Every day | ORAL | Status: DC | PRN

## 2011-11-28 NOTE — Telephone Encounter (Signed)
Pt advised of Rx via VM 

## 2011-11-28 NOTE — Telephone Encounter (Signed)
Ok - cialis 20 mg #6 as needed. (he spoke to me as I was walking to the hospital - go figure)

## 2011-11-28 NOTE — Telephone Encounter (Signed)
Pt called stating he discuss Rx for either cialis or Viagra with MD but Rx has not been sent to pharmacy. Pt is requesting a prescription to his [facility] pharmacy, please advise.

## 2011-12-06 ENCOUNTER — Encounter: Payer: Self-pay | Admitting: Internal Medicine

## 2011-12-12 ENCOUNTER — Encounter: Payer: Self-pay | Admitting: Radiation Oncology

## 2011-12-12 ENCOUNTER — Ambulatory Visit
Admission: RE | Admit: 2011-12-12 | Discharge: 2011-12-12 | Disposition: A | Discharge status: Home / Self Care | Payer: Medicare Other | Source: Ambulatory Visit | Attending: Radiation Oncology | Admitting: Radiation Oncology

## 2011-12-12 VITALS — BP 122/75 | HR 71 | Temp 97.7°F | Resp 18 | Wt 221.3 lb

## 2011-12-12 DIAGNOSIS — C801 Malignant (primary) neoplasm, unspecified: Secondary | ICD-10-CM

## 2011-12-12 NOTE — Progress Notes (Signed)
Patient presents to the clinic today for a follow up appointment with Dr. Dayton Scrape. Patient is alert and oriented to person, place, and time. No distress noted. Steady gait noted. Pleasant affect noted. Patient denies pain at this time. Weight stable. Patient scheduled to see Dr. Ezzard Standing July 2013. Dry mouth continues. Patient denies sore or ulcerations of the mouth. Patient scheduled for a coloscopy on Thursday of this week. Patient denies nausea, vomiting, dizziness, headache or diarrhea. Patient reports occasional episodes of constipation. Reported all findings to Dr. Dayton Scrape.

## 2011-12-12 NOTE — Progress Notes (Signed)
Followup note:  Phillip [name] visits today approximately one year and 8 months following completion of chemoradiation in the management of his T1 N1 squamous cell carcinoma of the right tonsil. He still attains his nutrition with Ensure by mouth even though he does not have dysphagia. His weight remained stable. He remains active participating in [employer] and golf. He'll see Dr. Ezzard Standing again in July. He'll see Dr. Gaylyn Rong in June after her staging CT scans. He maintains his dental followup.  The physical examination: Alert and oriented. Vital signs: BP 122/75, pulse 71, temperature 97.7, weight 221 pounds Nodes: There is no palpable lymphadenopathy in the neck. Oral cavity and oropharynx are unremarkable with the exception of moderate xerostomia. He does have slight trismus. No visible or palpable evidence for recurrent disease along the right oropharynx. Indirect mirror examination confirmatory.  Impression: No evidence for recurrent disease. He'll be 2 years out from his treatment completion by this August. I will see him back for a followup visit in September of this year. Dr. Ezzard Standing I can spread out our followup visits after 2 years of followup.

## 2011-12-15 ENCOUNTER — Encounter: Payer: Self-pay | Admitting: Internal Medicine

## 2011-12-15 ENCOUNTER — Ambulatory Visit (AMBULATORY_SURGERY_CENTER): Payer: Medicare Other | Admitting: Internal Medicine

## 2011-12-15 VITALS — BP 126/67 | HR 64 | Temp 98.9°F | Resp 14 | Ht 75.0 in | Wt 223.0 lb

## 2011-12-15 DIAGNOSIS — Z1211 Encounter for screening for malignant neoplasm of colon: Secondary | ICD-10-CM

## 2011-12-15 DIAGNOSIS — D126 Benign neoplasm of colon, unspecified: Secondary | ICD-10-CM

## 2011-12-15 DIAGNOSIS — K635 Polyp of colon: Secondary | ICD-10-CM

## 2011-12-15 MED ORDER — SODIUM CHLORIDE 0.9 % IV SOLN: 500.0000 mL | INTRAVENOUS | Status: DC

## 2011-12-15 NOTE — Patient Instructions (Signed)
Discharge instructions given with verbal understanding. Handouts on polyps,diverticulosis and hemorrhoids given. Resume previous medications. YOU HAD AN ENDOSCOPIC PROCEDURE TODAY AT THE [facility]: Refer to the procedure report that was given to you for any specific questions about what was found during the examination.  If the procedure report does not answer your questions, please call your gastroenterologist to clarify.  If you requested that your care partner not be given the details of your procedure findings, then the procedure report has been included in a sealed envelope for you to review at your convenience later.  YOU SHOULD EXPECT: Some feelings of bloating in the abdomen. Passage of more gas than usual.  Walking can help get rid of the air that was put into your GI tract during the procedure and reduce the bloating. If you had a lower endoscopy (such as a colonoscopy or flexible sigmoidoscopy) you may notice spotting of blood in your stool or on the toilet paper. If you underwent a bowel prep for your procedure, then you may not have a normal bowel movement for a few days.  DIET: Your first meal following the procedure should be a light meal and then it is ok to progress to your normal diet.  A half-sandwich or bowl of soup is an example of a good first meal.  Heavy or fried foods are harder to digest and may make you feel nauseous or bloated.  Likewise meals heavy in dairy and vegetables can cause extra gas to form and this can also increase the bloating.  Drink plenty of fluids but you should avoid alcoholic beverages for 24 hours.  ACTIVITY: Your care partner should take you home directly after the procedure.  You should plan to take it easy, moving slowly for the rest of the day.  You can resume normal activity the day after the procedure however you should NOT DRIVE or use heavy machinery for 24 hours (because of the sedation medicines used during the test).    SYMPTOMS TO  REPORT IMMEDIATELY: A gastroenterologist can be reached at any hour.  During normal business hours, 8:30 AM to 5:00 PM Monday through Friday, call [phone].  After hours and on weekends, please call the GI answering service at [phone] who will take a message and have the physician on call contact you.   Following lower endoscopy (colonoscopy or flexible sigmoidoscopy):  Excessive amounts of blood in the stool  Significant tenderness or worsening of abdominal pains  Swelling of the abdomen that is new, acute  Fever of 100F or higher  FOLLOW UP: If any biopsies were taken you will be contacted by phone or by letter within the next 1-3 weeks.  Call your gastroenterologist if you have not heard about the biopsies in 3 weeks.  Our staff will call the home number listed on your records the next business day following your procedure to check on you and address any questions or concerns that you may have at that time regarding the information given to you following your procedure. This is a courtesy call and so if there is no answer at the home number and we have not heard from you through the emergency physician on call, we will assume that you have returned to your regular daily activities without incident.  SIGNATURES/CONFIDENTIALITY: You and/or your care partner have signed paperwork which will be entered into your electronic medical record.  These signatures attest to the fact that that the information above on your After Visit   Summary has been reviewed and is understood.  Full responsibility of the confidentiality of this discharge information lies with you and/or your care-partner.  

## 2011-12-15 NOTE — Op Note (Signed)
[facility] [address] [place], [place]  [address]  COLONOSCOPY PROCEDURE REPORT  PATIENT:  Phillip, [name]  MR#:  [id number] BIRTHDATE:  20-Sep-1940, 71 yrs. old  GENDER:  male ENDOSCOPIST:  Carie Caddy. Jaliya Siegmann, MD REF. BY:  Rosalyn Gess. Norins, M.D. PROCEDURE DATE:  12/15/2011 PROCEDURE:  Colonoscopy with snare polypectomy, Colon with cold biopsy polypectomy ASA CLASS:  Class III INDICATIONS:  Routine Risk Screening, 1st colonoscopy > 5 years ago [employer], [place], report not available) MEDICATIONS:   These medications were titrated to patient response per physician's verbal order, Versed 10 mg IV, Fentanyl 100 mcg IV  DESCRIPTION OF PROCEDURE:   After the risks benefits and alternatives of the procedure were thoroughly explained, informed consent was obtained.  Digital rectal exam was performed and revealed no rectal masses.   The LB [id number] endoscope was introduced through the anus and advanced to the cecum, which was identified by both the appendix and ileocecal valve, without limitations.  The quality of the prep was Moviprep fair.  The instrument was then slowly withdrawn as the colon was fully examined. <<PROCEDUREIMAGES>> FINDINGS:  Severe diverticulosis was found in the left colon.  A 2 mm sessile polyp was found in the cecum. The polyp was removed using cold biopsy forceps.  Two sessile polyps, 5 - 6 mm were found in the ascending colon. Polyps were snared without cautery. Retrieval was successful. Retroflexed views in the rectum revealed internal hemorrhoids. The scope was then withdrawn from the cecum and the procedure completed.  COMPLICATIONS:  None ENDOSCOPIC IMPRESSION: 1) Severe diverticulosis in the left colon 2) Sessile polyp in the cecum. Removed and sent to pathology. 3) Two polyps in the ascending colon. Removed and sent to pathology. 4) Internal hemorrhoids  RECOMMENDATIONS: 1) Await pathology results 2) Hold aspirin, aspirin products,  and anti-inflammatory medication for 1 week. 3) High fiber diet. 4) If the polyps removed today are proven to be adenomatous (pre-cancerous) polyps, you will need a colonoscopy in 3 years. Otherwise you should continue to follow colorectal cancer screening guidelines for "routine risk" patients with a colonoscopy in 10 years. You will receive a letter within 1-2 weeks with the results of your biopsy as well as final recommendations. Please call my office if you have not received a letter after 3 weeks.  [name], MD  CC:  [name], MD The Patient  n. eSIGNEDCarie Caddy. Ariannah Arenson at 12/15/2011 12:38 PM  [name], [phone]

## 2011-12-15 NOTE — Progress Notes (Signed)
Patient did not experience any of the following events: a burn prior to discharge; a fall within the facility; wrong site/side/patient/procedure/implant event; or a hospital transfer or hospital admission upon discharge from the facility. (G8907) Patient did not have preoperative order for IV antibiotic SSI prophylaxis. (G8918)  

## 2011-12-18 ENCOUNTER — Telehealth: Payer: Self-pay

## 2011-12-18 NOTE — Telephone Encounter (Signed)
Left message on answering machine. 

## 2011-12-20 ENCOUNTER — Encounter: Payer: Self-pay | Admitting: Internal Medicine

## 2012-01-30 ENCOUNTER — Encounter (HOSPITAL_COMMUNITY): Payer: Self-pay

## 2012-01-30 ENCOUNTER — Ambulatory Visit (HOSPITAL_COMMUNITY)
Admission: RE | Admit: 2012-01-30 | Discharge: 2012-01-30 | Disposition: A | Discharge status: Home / Self Care | Payer: Medicare Other | Source: Ambulatory Visit | Attending: Oncology | Admitting: Oncology

## 2012-01-30 ENCOUNTER — Other Ambulatory Visit (HOSPITAL_BASED_OUTPATIENT_CLINIC_OR_DEPARTMENT_OTHER): Payer: Medicare Other | Admitting: Lab

## 2012-01-30 DIAGNOSIS — E46 Unspecified protein-calorie malnutrition: Secondary | ICD-10-CM

## 2012-01-30 DIAGNOSIS — K117 Disturbances of salivary secretion: Secondary | ICD-10-CM | POA: Insufficient documentation

## 2012-01-30 DIAGNOSIS — Z5111 Encounter for antineoplastic chemotherapy: Secondary | ICD-10-CM

## 2012-01-30 DIAGNOSIS — C801 Malignant (primary) neoplasm, unspecified: Secondary | ICD-10-CM

## 2012-01-30 DIAGNOSIS — C099 Malignant neoplasm of tonsil, unspecified: Secondary | ICD-10-CM

## 2012-01-30 LAB — CMP (CANCER CENTER ONLY)
AST: 27 U/L (ref 11–38)
Albumin: 3.9 g/dL (ref 3.3–5.5)
BUN, Bld: 18 mg/dL (ref 7–22)
Calcium: 9.3 mg/dL (ref 8.0–10.3)
Chloride: 101 mEq/L (ref 98–108)
Potassium: 4.7 mEq/L (ref 3.3–4.7)

## 2012-01-30 LAB — CBC WITH DIFFERENTIAL/PLATELET
Basophils Absolute: 0 10*3/uL (ref 0.0–0.1)
EOS%: 3.6 % (ref 0.0–7.0)
Eosinophils Absolute: 0.2 10*3/uL (ref 0.0–0.5)
HGB: 16.2 g/dL (ref 13.0–17.1)
NEUT#: 2.9 10*3/uL (ref 1.5–6.5)
RDW: 12.4 % (ref 11.0–14.6)
WBC: 4.2 10*3/uL (ref 4.0–10.3)
lymph#: 0.6 10*3/uL — ABNORMAL LOW (ref 0.9–3.3)

## 2012-01-30 MED ORDER — IOHEXOL 300 MG/ML  SOLN
100.0000 mL | Freq: Once | INTRAMUSCULAR | Status: AC | PRN
  Administered 2012-01-30: 100 mL via INTRAVENOUS

## 2012-02-02 ENCOUNTER — Ambulatory Visit (HOSPITAL_BASED_OUTPATIENT_CLINIC_OR_DEPARTMENT_OTHER): Payer: Medicare Other | Admitting: Oncology

## 2012-02-02 ENCOUNTER — Telehealth: Payer: Self-pay | Admitting: Oncology

## 2012-02-02 ENCOUNTER — Encounter: Payer: Self-pay | Admitting: Oncology

## 2012-02-02 VITALS — BP 127/71 | HR 54 | Temp 97.4°F | Ht 75.0 in | Wt 217.2 lb

## 2012-02-02 DIAGNOSIS — C099 Malignant neoplasm of tonsil, unspecified: Secondary | ICD-10-CM

## 2012-02-02 DIAGNOSIS — C801 Malignant (primary) neoplasm, unspecified: Secondary | ICD-10-CM

## 2012-02-02 DIAGNOSIS — D696 Thrombocytopenia, unspecified: Secondary | ICD-10-CM

## 2012-02-02 DIAGNOSIS — E46 Unspecified protein-calorie malnutrition: Secondary | ICD-10-CM

## 2012-02-02 DIAGNOSIS — B977 Papillomavirus as the cause of diseases classified elsewhere: Secondary | ICD-10-CM

## 2012-02-02 HISTORY — DX: Thrombocytopenia, unspecified: D69.6

## 2012-02-02 NOTE — Telephone Encounter (Signed)
gv pt appt for sept and dec2013

## 2012-02-02 NOTE — Progress Notes (Signed)
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   PAST THERAPY: Every three week cisplatin and daily radiation therapy was started 01/31/2010. He is status post two cycles of cisplatin. He did not receive his third cycle due to progressive hearing loss and tinnitus. His radiation was initiated on 01/31/2010 and concluded on 03/22/2010.   CURRENT THERAPY: watchful observation.  INTERVAL HISTORY: Phillip [name] 71 y.o. male returns for regular follow up by himself.  He reports feeling well.  He still has not eating solid foods.  He said that it's too much to try solid food with xerotomia and gagging sensation.  He rather spends time reading the Bible and golfing which he truly enjoys.  He still drinks about 8 cans of Ensure a day and has maintained his weight. He denies any problem with lymph node swelling in his bilateral neck. With drinking liquid he does not have any odynophagia or dysphagia.  Patient denies fatigue, headache, visual changes, confusion, drenching night sweats, palpable lymph node swelling, mucositis, odynophagia, dysphagia, nausea vomiting, jaundice, chest pain, palpitation, shortness of breath, dyspnea on exertion, productive cough, gum bleeding, epistaxis, hematemesis, hemoptysis, abdominal pain, abdominal swelling, early satiety, melena, hematochezia, hematuria, skin rash, spontaneous bleeding, joint swelling, joint pain, heat or cold intolerance, bowel bladder incontinence, back pain, focal motor weakness, paresthesia, depression, suicidal or homocidal ideation, feeling hopelessness.   Past Medical History  Diagnosis Date  . Diarrhea   . Sinus bradycardia   . Neck pain   . Coronary artery disease   . Unspecified essential hypertension   . History of benign prostatic hypertrophy   . Squamous cell carcinoma     right  tonsil  . Thrombocytopenia 02/02/2012    Past Surgical History  Procedure Date  . Tonsillectomy   . Vasectomy   . Knee arthroscopy     left knee  . Dental surgery     extractions  . Peg placement     July 2011    Current Outpatient Prescriptions  Medication Sig Dispense Refill  . aspirin 81 MG tablet Take 81 mg by mouth daily.        Marland Kitchen docusate sodium (COLACE) 100 MG capsule Take 100 mg by mouth daily.        Marland Kitchen dutasteride (AVODART) 0.5 MG capsule Take 1 capsule (0.5 mg total) by mouth daily.  90 capsule  3  . ibuprofen (ADVIL,MOTRIN) 200 MG tablet Take 200 mg by mouth as needed.        Marland Kitchen lisinopril (PRINIVIL,ZESTRIL) 20 MG tablet Take 1 tablet (20 mg total) by mouth daily.  90 tablet  3  . Magnesium 200 MG TABS Take by mouth daily.      . Melatonin 3 MG CAPS Take by mouth as needed.        . Multiple Vitamin (MULTIVITAMIN) tablet Take 1 tablet by mouth daily.      . tadalafil (CIALIS) 20 MG tablet Take 20 mg by mouth daily as needed.      . zaleplon (SONATA) 10 MG capsule Take 10 mg by mouth at bedtime. As needed      . DISCONTD: tadalafil (CIALIS) 20 MG tablet Take 1 tablet (20 mg total) by mouth daily as needed for erectile dysfunction.  6 tablet  0    ALLERGIES:  is allergic to morphine and related.  REVIEW OF SYSTEMS:  The rest of the 14-point review of system was negative.   Filed Vitals:   02/02/12 1513  BP: 127/71  Pulse: 54  Temp: 97.4 F (36.3 C)   Wt Readings from Last 3 Encounters:  02/02/12 217 lb 3.2 oz (98.521 kg)  12/15/11 223 lb (101.152 kg)  12/12/11 221 lb 4.8 oz (100.381 kg)   ECOG Performance status: 0-1  PHYSICAL EXAMINATION:   General:  well-nourished man in no acute distress.  Eyes:  no scleral icterus.  ENT:  There were no oropharyngeal lesions.  Neck was without thyromegaly.  Lymphatics:  Negative cervical, supraclavicular or axillary adenopathy.  Respiratory: lungs were clear bilaterally without wheezing or crackles.  Cardiovascular:   Regular rate and rhythm, S1/S2, without murmur, rub or gallop.  There was no pedal edema.  GI:  abdomen was soft, flat, nontender, nondistended, without organomegaly.  Muscoloskeletal:  no spinal tenderness of palpation of vertebral spine.  Skin exam was without echymosis, petichae.  Neuro exam was nonfocal.  Patient was able to get on and off exam table without assistance.  Gait was normal.  Patient was alerted and oriented.  Attention was good.   Language was appropriate.  Mood was normal without depression.  Speech was not pressured.  Thought content was not tangential.    LABORATORY/RADIOLOGY DATA:  Lab Results  Component Value Date   WBC 4.2 01/30/2012   HGB 16.2 01/30/2012   HCT 46.3 01/30/2012   PLT 117* 01/30/2012   GLUCOSE 103 01/30/2012   CHOL 166 09/05/2011   TRIG 264.0* 09/05/2011   HDL 30.70* 09/05/2011   LDLDIRECT 103.4 09/05/2011   LDLCALC 96 03/16/2011   ALKPHOS 64 01/30/2012   ALT 25 09/05/2011   ALT 25 09/05/2011   AST 27 01/30/2012   NA 144 01/30/2012   K 4.7 01/30/2012   CL 101 01/30/2012   CREATININE 1.1 01/30/2012   BUN 18 01/30/2012   CO2 31 01/30/2012   PSA 0.13 09/05/2011    IMAGING:  I personally reviewed the following CT and showed the patient the images.   Ct Soft Tissue Neck W Contrast  01/30/2012  *RADIOLOGY REPORT*  Clinical Data: Tonsillar cancer.  Chemotherapy and radiation complete.  Dry mouth.  CT NECK WITH CONTRAST  Technique:  Multidetector CT imaging of the neck was performed with intravenous contrast.  Contrast: OMNIPAQUE IOHEXOL 300 MG/ML  SOLN  Comparison: CT 08/07/2011  Findings: Pharyngeal mucosal thickening has improved somewhat.  No recurrent mass lesion is identified.  Epiglottis is normal.  Larynx is normal.  No pathologic adenopathy in the neck.  Parotid and submandibular glands are normal.  Thyroid is normal. Lung apices are clear.  Moderate carotid atherosclerotic disease is present.  Moderate to advanced cervical spondylosis is present.  No acute  bony lesions are identified.  IMPRESSION: Postradiation changes in the pharynx appears slightly improved.  No recurrent mass or adenopathy is detected.  Original Report Authenticated By: Camelia Phenes, M.D.    ASSESSMENT AND PLAN:   1. History of oropharyngeal squamous cell carcinoma: I discussed with Mr. Howell that there is no evidence of any residual disease or metastatic disease on clinical history, physical exam, laboratory tests, or imaging modality.  After 2 years out from therapy, we do not normally obtain routine CT scan unless you have symptoms.  I advised him to follow up with ENT and Radiation Oncology for examination twice a year.  If he develops concerning symptoms or abnormal finding on exam  we may consider repeating scans.  He expressed informed understanding wished to proceed with this recommendation. 2. Thrombocytopenia: Unclear etiology. This has slightly decreased compared to before. However it is still above the threshold 30,000 where patients are at risk of spontaneous hemorrhage. I again recommended watchful observation with repeat CBC at the cancer Center about 3 months. In the future if he develops worsening thrombocytopenia to less than 80,000, we may consider a diagnostic bone marrow biopsy. However a bone marrow biopsy at this time has low clinical utility. 3. 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 He has a hearing aid and it is helping him significantly.  4. Borderline diabetes: Diet control per PCP.  5. Mild calorie protein malnutrition and aversions to solid foods:  He is happy and resigned to continue liquid Ensure for the rest of his life.  Despite psychotherapy, he still does not want to attempt resuming solid foods. 6. Anxiety and depression. He is off of citalopram with stable mood.  7. Hypertension. He is on lisinopril per PCP.  8. Hyperlipidemia. Diet control. 9. Followup: In about 6 months.    The length of time of the face-to-face encounter was 15 minutes. More than  50% of time was spent counseling and coordination of care.

## 2012-02-02 NOTE — Patient Instructions (Addendum)
A.  CT neck 01/30/2012:  CT NECK WITH CONTRAST  Technique: Multidetector CT imaging of the neck was performed with  intravenous contrast.  Contrast: OMNIPAQUE IOHEXOL 300 MG/ML SOLN  Comparison: CT 08/07/2011  Findings: Pharyngeal mucosal thickening has improved somewhat. No  recurrent mass lesion is identified. Epiglottis is normal. Larynx  is normal.  No pathologic adenopathy in the neck.  Parotid and submandibular glands are normal. Thyroid is normal.  Lung apices are clear.  Moderate carotid atherosclerotic disease is present. Moderate to  advanced cervical spondylosis is present. No acute bony lesions  are identified.   IMPRESSION:   Postradiation changes in the pharynx appears slightly improved. No  recurrent mass or adenopathy is detected.  B.  Low platelet count: unclear etiology.  There is low risk of bleeding when plt is >50.  Will continue to observe.  In the future, if if significantly worsens to <80, I may consider bone marrow biopsy.   C.  Follow up:  Lab appointment at the [facility] in about 3 months.  Follow up visit in about 6 months.  After 2 years from therapy, we do not normally obtain routine CT scan unless you have symptoms.  Please follow up with ENT and Radiation Oncology for examination of your throat at least twice a year.

## 2012-04-16 ENCOUNTER — Encounter: Payer: Self-pay | Admitting: Specialist

## 2012-04-16 NOTE — Progress Notes (Signed)
I spoke today with Phillip [name] about his former offer to speak with other head and neck cancer survivors.  Another gentleman requested to talk with a survivor since we no longer have an active Head and Neck Support Group.  Phillip [name] indicated that it would be fine for me to give the other man his contact information and that he would be glad to talk with the other survivor.

## 2012-05-03 ENCOUNTER — Telehealth: Payer: Self-pay | Admitting: *Deleted

## 2012-05-03 ENCOUNTER — Encounter: Payer: Self-pay | Admitting: Radiation Oncology

## 2012-05-03 ENCOUNTER — Other Ambulatory Visit (HOSPITAL_BASED_OUTPATIENT_CLINIC_OR_DEPARTMENT_OTHER): Payer: Medicare Other | Admitting: Lab

## 2012-05-03 DIAGNOSIS — D696 Thrombocytopenia, unspecified: Secondary | ICD-10-CM

## 2012-05-03 DIAGNOSIS — C801 Malignant (primary) neoplasm, unspecified: Secondary | ICD-10-CM

## 2012-05-03 LAB — CBC WITH DIFFERENTIAL/PLATELET
Basophils Absolute: 0 10*3/uL (ref 0.0–0.1)
Eosinophils Absolute: 0.2 10*3/uL (ref 0.0–0.5)
HCT: 44.6 % (ref 38.4–49.9)
HGB: 15.2 g/dL (ref 13.0–17.1)
LYMPH%: 13.2 % — ABNORMAL LOW (ref 14.0–49.0)
MCHC: 34.1 g/dL (ref 32.0–36.0)
MONO#: 0.5 10*3/uL (ref 0.1–0.9)
NEUT#: 3.7 10*3/uL (ref 1.5–6.5)
NEUT%: 73.4 % (ref 39.0–75.0)
Platelets: 116 10*3/uL — ABNORMAL LOW (ref 140–400)
WBC: 5.1 10*3/uL (ref 4.0–10.3)

## 2012-05-03 NOTE — Telephone Encounter (Signed)
Called pt and informed him of Platelet count stable per Dr. Gaylyn Rong,  Continue observation.  He verbalized understanding.

## 2012-05-03 NOTE — Telephone Encounter (Signed)
Message copied by Wende Mott on Fri May 03, 2012  2:04 PM ------      Message from: Phillip [name]      Created: Fri May 03, 2012  1:39 PM       Please call pt.  His plt is still slightly low; but stable from 3 months ago.  I recommended observation.  Thanks.

## 2012-05-07 ENCOUNTER — Other Ambulatory Visit (HOSPITAL_COMMUNITY): Payer: Self-pay | Admitting: Dentistry

## 2012-05-07 ENCOUNTER — Ambulatory Visit
Admission: RE | Admit: 2012-05-07 | Discharge: 2012-05-07 | Disposition: A | Discharge status: Home / Self Care | Payer: Medicare Other | Source: Ambulatory Visit | Attending: Radiation Oncology | Admitting: Radiation Oncology

## 2012-05-07 VITALS — BP 120/73 | HR 61 | Temp 97.8°F | Resp 20 | Wt 217.0 lb

## 2012-05-07 DIAGNOSIS — C801 Malignant (primary) neoplasm, unspecified: Secondary | ICD-10-CM

## 2012-05-07 MED ORDER — SODIUM FLUORIDE 1.1 % DT CREA: TOPICAL_CREAM | DENTAL | Status: AC

## 2012-05-07 NOTE — Progress Notes (Signed)
Patient, alert,oriented x3, here f/u squamous cell ca right tonsil Drinks only ensure 8 cans daily, and drinks 24 oz. Water 4 x day,  Played 18 holes golf yesterday and plays also on Wednesdays, exercises walking,  Had a donut hole yesterday , had beenie weenies a month go, double cheeseburger from [employer] 6-8 weeks okay, no difficulty swallowing, doesn't taste good states patient,  Saw Dr. Narda Bonds 6-8 weeks ago

## 2012-05-07 NOTE — Progress Notes (Addendum)
Followup note:  Phillip [name] returns today approximately 2 years and 1 month following completion of chemoradiation in the management of his T1 N1 squamous cell carcinoma the right tonsil. He still attains most of his nutrition with Ensure, a cancer day. He does eat of the foods on occasion but really has no pleasure eating. He believes that his taste may be slightly improved. He remains active, primarily playing golf, shooting his age twice. He saw Dr. Ezzard Standing this past summer and will see him again later this year. He sees Dr. Gaylyn Rong on December. Dr. Gaylyn Rong is following his thrombocytopenia. He maintains his dental followup.  Physical examination: Alert and oriented.  Wt Readings from Last 3 Encounters:  05/07/12 217 lb (98.431 kg)  02/02/12 217 lb 3.2 oz (98.521 kg)  12/15/11 223 lb (101.152 kg)   Temp Readings from Last 3 Encounters:  05/07/12 97.8 F (36.6 C) Oral  02/02/12 97.4 F (36.3 C) Oral  12/15/11 98.9 F (37.2 C)    BP Readings from Last 3 Encounters:  05/07/12 120/73  02/02/12 127/71  12/15/11 126/67   Pulse Readings from Last 3 Encounters:  05/07/12 61  02/02/12 54  12/15/11 64   Nodes: There is no palpable lymphadenopathy in the neck. Oral cavity and oropharynx:  remarkable for moderate xerostomia which is unchanged. He continues to demonstrate mild to moderate trismus. I believe this is unchanged. There is no visible evidence for persistent disease. Indirect mirror examination confirmatory.  Laboratory data:  Lab Results  Component Value Date   WBC 5.1 05/03/2012   HGB 15.2 05/03/2012   HCT 44.6 05/03/2012   MCV 99.3* 05/03/2012   PLT 116* 05/03/2012    Impression: Satisfactory progress. He is now over 2 years out from completion of his chemoradiation, think we can spread out his followup visits. If he sees Dr. Ezzard Standing 3-4 months and I will see him back in approximately 6 months. He'll see Dr. Gaylyn Rong in December.  Plan: As discussed above.

## 2012-05-14 ENCOUNTER — Ambulatory Visit: Payer: Medicare Other | Admitting: Radiation Oncology

## 2012-06-20 ENCOUNTER — Ambulatory Visit (INDEPENDENT_AMBULATORY_CARE_PROVIDER_SITE_OTHER): Payer: Medicare Other

## 2012-06-20 DIAGNOSIS — Z23 Encounter for immunization: Secondary | ICD-10-CM

## 2012-08-01 ENCOUNTER — Telehealth: Payer: Self-pay | Admitting: Oncology

## 2012-08-01 ENCOUNTER — Telehealth: Payer: Self-pay | Admitting: *Deleted

## 2012-08-01 NOTE — Telephone Encounter (Signed)
Called pt to ask if ok to r/s his visit w/ Clenton Pare tomorrow to next week for our convenience?  Pt agreed states not a problem for him to come next week instead of tomorrow.  Gave him appt to arrive at 12:45 pm for lab,  Visit w/ Belenda Cruise at 1:15 pm on Tues 12/17 and POF sent to scheduling. Pt verbalized understanding.

## 2012-08-01 NOTE — Telephone Encounter (Signed)
Per 12.12.13 pof r/s lab and est to 12.17.Marland KitchenMarland KitchenSee pof...the patient aware per pof

## 2012-08-02 ENCOUNTER — Ambulatory Visit: Payer: Self-pay | Admitting: Oncology

## 2012-08-02 ENCOUNTER — Other Ambulatory Visit: Payer: Self-pay

## 2012-08-06 ENCOUNTER — Telehealth: Payer: Self-pay | Admitting: Oncology

## 2012-08-06 ENCOUNTER — Encounter: Payer: Self-pay | Admitting: Oncology

## 2012-08-06 ENCOUNTER — Other Ambulatory Visit (HOSPITAL_BASED_OUTPATIENT_CLINIC_OR_DEPARTMENT_OTHER): Payer: Medicare Other | Admitting: Lab

## 2012-08-06 ENCOUNTER — Ambulatory Visit (HOSPITAL_BASED_OUTPATIENT_CLINIC_OR_DEPARTMENT_OTHER): Payer: Medicare Other | Admitting: Oncology

## 2012-08-06 VITALS — BP 134/72 | HR 59 | Temp 97.1°F | Resp 18 | Ht 75.0 in | Wt 216.4 lb

## 2012-08-06 DIAGNOSIS — C099 Malignant neoplasm of tonsil, unspecified: Secondary | ICD-10-CM

## 2012-08-06 DIAGNOSIS — D696 Thrombocytopenia, unspecified: Secondary | ICD-10-CM

## 2012-08-06 DIAGNOSIS — H918X9 Other specified hearing loss, unspecified ear: Secondary | ICD-10-CM

## 2012-08-06 DIAGNOSIS — C801 Malignant (primary) neoplasm, unspecified: Secondary | ICD-10-CM

## 2012-08-06 DIAGNOSIS — B977 Papillomavirus as the cause of diseases classified elsewhere: Secondary | ICD-10-CM

## 2012-08-06 LAB — CBC WITH DIFFERENTIAL/PLATELET
BASO%: 0.1 % (ref 0.0–2.0)
Basophils Absolute: 0 10*3/uL (ref 0.0–0.1)
EOS%: 1.9 % (ref 0.0–7.0)
HCT: 45.7 % (ref 38.4–49.9)
LYMPH%: 11.8 % — ABNORMAL LOW (ref 14.0–49.0)
MCH: 34.2 pg — ABNORMAL HIGH (ref 27.2–33.4)
MCHC: 34.5 g/dL (ref 32.0–36.0)
MCV: 98.9 fL — ABNORMAL HIGH (ref 79.3–98.0)
NEUT%: 75.9 % — ABNORMAL HIGH (ref 39.0–75.0)
Platelets: 114 10*3/uL — ABNORMAL LOW (ref 140–400)

## 2012-08-06 LAB — COMPREHENSIVE METABOLIC PANEL (CC13)
AST: 22 U/L (ref 5–34)
Alkaline Phosphatase: 72 U/L (ref 40–150)
BUN: 18 mg/dL (ref 7.0–26.0)
Calcium: 9.4 mg/dL (ref 8.4–10.4)
Creatinine: 0.9 mg/dL (ref 0.7–1.3)
Glucose: 116 mg/dl — ABNORMAL HIGH (ref 70–99)

## 2012-08-06 NOTE — Progress Notes (Signed)
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   PAST THERAPY: Every three week cisplatin and daily radiation therapy was started 01/31/2010. He is status post two cycles of cisplatin. He did not receive his third cycle due to progressive hearing loss and tinnitus. His radiation was initiated on 01/31/2010 and concluded on 03/22/2010.   CURRENT THERAPY: watchful observation.  INTERVAL HISTORY: Phillip [name] 71 y.o. male returns for regular follow up by himself.  He reports feeling well.  He still has not eating solid foods.  He said that it's too much to try solid food with xerotomia and gagging sensation.  He rather spends time reading golfing which he truly enjoys.  He still drinks about 8 cans of Ensure a day and has maintained his weight. He denies any problem with lymph node swelling in his bilateral neck. With drinking liquid he does not have any odynophagia or dysphagia.  Patient denies fatigue, headache, visual changes, confusion, drenching night sweats, palpable lymph node swelling, mucositis, odynophagia, dysphagia, nausea vomiting, jaundice, chest pain, palpitation, shortness of breath, dyspnea on exertion, productive cough, gum bleeding, epistaxis, hematemesis, hemoptysis, abdominal pain, abdominal swelling, early satiety, melena, hematochezia, hematuria, skin rash, spontaneous bleeding, joint swelling, joint pain, heat or cold intolerance, bowel bladder incontinence, back pain, focal motor weakness, paresthesia, depression, suicidal or homocidal ideation, feeling hopelessness.   Past Medical History  Diagnosis Date  . Diarrhea   . Sinus bradycardia   . Neck pain   . Coronary artery disease   . Unspecified essential hypertension   . History of benign prostatic hypertrophy   . Squamous cell carcinoma     right tonsil  .  Thrombocytopenia 02/02/2012  . History of radiation therapy 01/31/10-03/22/10    right tonsil  . HTN (hypertension)   . Anxiety   . Hyperlipidemia     diet controlled  . Thrombocytopenia     unclear etiology  . 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 Tinnitus   . Hearing loss     Past Surgical History  Procedure Date  . Tonsillectomy   . Vasectomy   . Knee arthroscopy     left knee  . Dental surgery     extractions  . Peg placement     July 2011    Current Outpatient Prescriptions  Medication Sig Dispense Refill  . aspirin 81 MG tablet Take 81 mg by mouth daily.        Marland Kitchen docusate sodium (COLACE) 100 MG capsule Take 100 mg by mouth daily.        Marland Kitchen dutasteride (AVODART) 0.5 MG capsule Take 1 capsule (0.5 mg total) by mouth daily.  90 capsule  3  . ibuprofen (ADVIL,MOTRIN) 200 MG tablet Take 200 mg by mouth as needed.        Marland Kitchen lisinopril (PRINIVIL,ZESTRIL) 20 MG tablet Take 1 tablet (20 mg total) by mouth daily.  90 tablet  3  . Magnesium 200 MG TABS Take by mouth daily.      . Melatonin 3 MG CAPS Take by mouth as needed.        . Multiple Vitamin (MULTIVITAMIN) tablet Take 1 tablet by mouth daily.      . sodium fluoride (PREVIDENT 5000 PLUS) 1.1 % CREA dental cream Apply thin ribbon of cream to tooth brush. Brush teeth for  2 minutes. Spit out excess-DO NOT swallow. DO NOT rinse afterwards. Repeat nightly.  1 Tube  prn  . tadalafil (CIALIS) 20 MG tablet Take 20 mg by mouth daily as needed.      . zaleplon (SONATA) 10 MG capsule Take 10 mg by mouth at bedtime. As needed        ALLERGIES:  is allergic to morphine and related.  REVIEW OF SYSTEMS:  The rest of the 14-point review of system was negative.   Filed Vitals:   08/06/12 1330  BP: 134/72  Pulse: 59  Temp: 97.1 F (36.2 C)  Resp: 18   Wt Readings from Last 3 Encounters:  08/06/12 216 lb 6.4 oz (98.158 kg)  05/07/12 217 lb (98.431 kg)  02/02/12 217 lb 3.2 oz (98.521 kg)   ECOG Performance status: 0-1  PHYSICAL  EXAMINATION:   General:  well-nourished man in no acute distress.  Eyes:  no scleral icterus.  ENT:  There were no oropharyngeal lesions.  Neck was without thyromegaly.  Lymphatics:  Negative cervical, supraclavicular or axillary adenopathy.  Respiratory: lungs were clear bilaterally without wheezing or crackles.  Cardiovascular:  Regular rate and rhythm, S1/S2, without murmur, rub or gallop.  There was no pedal edema.  GI:  abdomen was soft, flat, nontender, nondistended, without organomegaly.  Muscoloskeletal:  no spinal tenderness of palpation of vertebral spine.  Skin exam was without echymosis, petichae.  Neuro exam was nonfocal.  Patient was able to get on and off exam table without assistance.  Gait was normal.  Patient was alerted and oriented.  Attention was good.   Language was appropriate.  Mood was normal without depression.  Speech was not pressured.  Thought content was not tangential.    LABORATORY/RADIOLOGY DATA:  Lab Results  Component Value Date   WBC 4.5 08/06/2012   HGB 15.8 08/06/2012   HCT 45.7 08/06/2012   PLT 114* 08/06/2012   GLUCOSE 116* 08/06/2012   CHOL 166 09/05/2011   TRIG 264.0* 09/05/2011   HDL 30.70* 09/05/2011   LDLDIRECT 103.4 09/05/2011   LDLCALC 96 03/16/2011   ALKPHOS 72 08/06/2012   ALT 21 08/06/2012   AST 22 08/06/2012   NA 140 08/06/2012   K 4.5 08/06/2012   CL 105 08/06/2012   CREATININE 0.9 08/06/2012   BUN 18.0 08/06/2012   CO2 28 08/06/2012   PSA 0.13 09/05/2011    ASSESSMENT AND PLAN:   1. History of oropharyngeal squamous cell carcinoma: I discussed with Mr. Ferrin that there is no evidence of any residual disease or metastatic disease on clinical history, physical exam, or laboratory tests.  After 2 years out from therapy, we do not normally obtain routine CT scan unless you have symptoms.  I advised him to follow up with ENT and Radiation Oncology for examination twice a year.  If he develops concerning symptoms or abnormal finding on exam we  may consider repeating scans.  He expressed informed understanding wished to proceed with this recommendation. 2. Thrombocytopenia: Unclear etiology. This has slightly decreased compared to before. However it is still above the threshold 30,000 where patients are at risk of spontaneous hemorrhage. I again recommended watchful observation with repeat CBC at the cancer Center about 3 months. In the future if he develops worsening thrombocytopenia to less than 80,000, we may consider a diagnostic bone marrow biopsy. However a bone marrow biopsy at this time has low clinical utility. 3. 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 He has a hearing aid and it is helping  him significantly.  4. Borderline diabetes: Diet control per PCP.  5. Mild calorie protein malnutrition and aversions to solid foods:  He is happy and resigned to continue liquid Ensure for the rest of his life.  Despite psychotherapy, he still does not want to attempt resuming solid foods. 6. Anxiety and depression. He is off of citalopram with stable mood.  7. Hypertension. He is on lisinopril per PCP.  8. Hyperlipidemia. Diet control. 9. Followup: In about 6 months.    The length of time of the face-to-face encounter was 15 minutes. More than 50% of time was spent counseling and coordination of care.

## 2012-08-06 NOTE — Telephone Encounter (Signed)
gv and printed appt schedule to pt for March and May 2014

## 2012-08-15 ENCOUNTER — Other Ambulatory Visit: Payer: Self-pay | Admitting: Internal Medicine

## 2012-08-22 ENCOUNTER — Other Ambulatory Visit (HOSPITAL_COMMUNITY): Payer: Self-pay | Admitting: Dentistry

## 2012-08-22 MED ORDER — SODIUM FLUORIDE 1.1 % DT GEL: 1.0000 "application " | Freq: Every day | DENTAL | Status: AC

## 2012-09-05 ENCOUNTER — Telehealth: Payer: Self-pay | Admitting: *Deleted

## 2012-09-05 NOTE — Telephone Encounter (Signed)
Pt called asking if Dr Debby Bud would put an order in for his labs prior to his CPE on 09/18/12. Please advise.

## 2012-09-06 NOTE — Telephone Encounter (Signed)
Gracefully decline - cannot start making exceptions to my policy for one, for then it will be for all.

## 2012-09-10 NOTE — Telephone Encounter (Signed)
LMOVM advising per MD. 

## 2012-09-18 ENCOUNTER — Encounter: Payer: Self-pay | Admitting: Internal Medicine

## 2012-09-18 ENCOUNTER — Ambulatory Visit (INDEPENDENT_AMBULATORY_CARE_PROVIDER_SITE_OTHER): Payer: Medicare Other | Admitting: Internal Medicine

## 2012-09-18 ENCOUNTER — Encounter: Payer: Medicare Other | Admitting: Internal Medicine

## 2012-09-18 VITALS — BP 102/70 | HR 76 | Temp 98.6°F | Resp 16 | Ht 75.0 in | Wt 217.0 lb

## 2012-09-18 DIAGNOSIS — Z0000 Encounter for general adult medical examination without abnormal findings: Secondary | ICD-10-CM

## 2012-09-18 DIAGNOSIS — C801 Malignant (primary) neoplasm, unspecified: Secondary | ICD-10-CM

## 2012-09-18 DIAGNOSIS — Z87898 Personal history of other specified conditions: Secondary | ICD-10-CM

## 2012-09-18 DIAGNOSIS — I10 Essential (primary) hypertension: Secondary | ICD-10-CM

## 2012-09-18 DIAGNOSIS — I2510 Atherosclerotic heart disease of native coronary artery without angina pectoris: Secondary | ICD-10-CM

## 2012-09-18 DIAGNOSIS — I6529 Occlusion and stenosis of unspecified carotid artery: Secondary | ICD-10-CM

## 2012-09-18 MED ORDER — LISINOPRIL 20 MG PO TABS: 20.0000 mg | ORAL_TABLET | Freq: Every day | ORAL | Status: DC

## 2012-09-18 MED ORDER — ZALEPLON 10 MG PO CAPS: 10.0000 mg | ORAL_CAPSULE | Freq: Every day | ORAL | Status: DC

## 2012-09-18 MED ORDER — DUTASTERIDE 0.5 MG PO CAPS: 0.5000 mg | ORAL_CAPSULE | Freq: Every day | ORAL | Status: DC

## 2012-09-18 NOTE — Assessment & Plan Note (Signed)
Doing very well in sustained remission. He has just seen Dr. Narda Bonds and last [facility] notes reviewed and confirm how well he is doing.  Plan - routine follow up with oncology and radiation oncology.

## 2012-09-18 NOTE — Assessment & Plan Note (Signed)
BP Readings from Last 3 Encounters:  09/18/12 102/70  08/06/12 134/72  05/07/12 120/73   Very good control. He has lost weight and is very physically active.  Plan  Drug Holiday - stop lisinopril for 7-10 days and monitor BP

## 2012-09-18 NOTE — Progress Notes (Signed)
Subjective:    Patient ID: Phillip [name], male    DOB: 1940/08/28, 72 y.o.   MRN: [id number]  HPI The patient is here for annual Medicare wellness examination and management of other chronic and acute problems.  He has been doing well. He has started experimenting with eating. He has had not had any major illness, surgery, injury. He is followed twice a year by oncology and radiation oncology.  He does report nocturia x 2-3 but it is not interfering with quality of life.   The risk factors are reflected in the social history.  The roster of all physicians providing medical care to patient - is listed in the Snapshot section of the chart.  Activities of daily living:  The patient is 100% inedpendent in all ADLs: dressing, toileting, feeding as well as independent mobility  Home safety : The patient has smoke detectors in the home. Falls - no falls. Home is fall safe. They wear seatbelts. No firearms at home   There is no risks for hepatitis, STDs or HIV. There is no   history of blood transfusion. They have no travel history to infectious disease endemic areas of the world.  The patient has seen their dentist in the last six month. They have not seen their eye doctor in the last year. They deny any hearing difficulty and have not had audiologic testing in the last year.  They do not  have excessive sun exposure. Discussed the need for sun protection: hats, long sleeves and use of sunscreen if there is significant sun exposure.   Diet: the importance of a healthy diet is discussed. They do have a healthy diet - primarily on liquid supplement as base diet.  The patient has a regular exercise program: golfs, walks,  , 45 - 90 min duration, 3-5 per week.  The benefits of regular aerobic exercise were discussed.  Depression screen: there are no signs or vegative symptoms of depression- irritability, change in appetite, anhedonia, sadness/tearfullness.  Cognitive assessment: the patient manages  all their financial and personal affairs and is actively engaged.   The following portions of the patient's history were reviewed and updated as appropriate: allergies, current medications, past family history, past medical history,  past surgical history, past social history  and problem list.  Past Medical History  Diagnosis Date  . Diarrhea   . Sinus bradycardia   . Neck pain   . Coronary artery disease   . Unspecified essential hypertension   . History of benign prostatic hypertrophy   . Squamous cell carcinoma     right tonsil  . Thrombocytopenia 02/02/2012  . History of radiation therapy 01/31/10-03/22/10    right tonsil  . HTN (hypertension)   . Anxiety   . Hyperlipidemia     diet controlled  . Thrombocytopenia     unclear etiology  . 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 Tinnitus   . Hearing loss    Past Surgical History  Procedure Date  . Tonsillectomy   . Vasectomy   . Knee arthroscopy     left knee  . Dental surgery     extractions  . Peg placement     July 2011   Family History  Problem Relation Age of Onset  . Coronary artery disease Father   . Heart attack Father   . Heart disease Father   . Colon cancer Neg Hx   . Esophageal cancer Neg Hx   . Rectal cancer Neg Hx   .  Stomach cancer Neg Hx    History   Social History  . Marital Status: Married    Spouse Name: N/A    Number of Children: N/A  . Years of Education: N/A   Occupational History  . Not on file.   Social History Main Topics  . Smoking status: Never Smoker   . Smokeless tobacco: Never Used  . Alcohol Use: No  . Drug Use: No  . Sexually Active: Not on file   Other Topics Concern  . 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 Is thinking of  moving to the greater [facility] area. Jan 11' moved to a villa outside of [place].     Current Outpatient Prescriptions on File Prior to Visit  Medication Sig Dispense Refill  . aspirin 81 MG tablet Take 81 mg by mouth daily.        Marland Kitchen docusate sodium (COLACE) 100 MG capsule Take 100 mg by mouth daily.        Marland Kitchen ibuprofen (ADVIL,MOTRIN) 200 MG tablet Take 200 mg by mouth as needed.        Marland Kitchen lisinopril (PRINIVIL,ZESTRIL) 20 MG tablet Take 1 tablet (20 mg total) by mouth daily.  90 tablet  3  . Magnesium 200 MG TABS Take by mouth daily.      . Melatonin 3 MG CAPS Take by mouth as needed.        . Multiple Vitamin (MULTIVITAMIN) tablet Take 1 tablet by mouth daily.      . sodium fluoride (FLUORISHIELD) 1.1 % GEL dental gel Place 1 application onto teeth at bedtime. Patient to use as alternative to the PreviDent 5000 Plus gel.  120 mL  prn  . sodium fluoride (PREVIDENT 5000 PLUS) 1.1 % CREA dental cream Apply thin ribbon of cream to tooth brush. Brush teeth for 2 minutes. Spit out excess-DO NOT swallow. DO NOT rinse afterwards. Repeat nightly.  1 Tube  prn  . tadalafil (CIALIS) 20 MG tablet Take 20 mg by mouth daily as needed.      . zaleplon (SONATA) 10 MG capsule Take 1 capsule (10 mg total) by mouth at bedtime. As needed  30 capsule  2     Vision, hearing, body mass index were assessed and reviewed.   During the course of the visit the patient was educated and counseled about appropriate screening and preventive services including : fall prevention , diabetes screening, nutrition counseling, colorectal cancer screening, and recommended immunizations.    Review of Systems Constitutional:  Negative for fever, chills, activity change and unexpected weight change.  HEENT:  Negative for hearing loss, ear pain, congestion, neck stiffness and postnasal drip. Negative for sore throat or swallowing problems. Negative for dental complaints.  Severe xerostomia Eyes: Negative for vision loss  or change in visual acuity.  Respiratory: Negative for chest tightness and wheezing. Negative for DOE.   Cardiovascular: Negative for chest pain or palpitations. No decreased exercise tolerance Gastrointestinal: No change in bowel habit. No bloating or gas. No reflux or indigestion Genitourinary: Negative for urgency, frequency, flank pain and difficulty urinating.  Musculoskeletal: Negative for myalgias, back pain, arthralgias and gait problem.  Neurological: Negative for dizziness, tremors, weakness and headaches.  Hematological: Negative for adenopathy.  Psychiatric/Behavioral: Negative for behavioral problems and dysphoric mood.  Objective:   Physical Exam Filed Vitals:   09/18/12 1536  BP: 102/70  Pulse: 76  Temp: 98.6 F (37 C)  Resp: 16   Wt Readings from Last 3 Encounters:  09/18/12 217 lb (98.431 kg)  08/06/12 216 lb 6.4 oz (98.158 kg)  05/07/12 217 lb (98.431 kg)   Gen'l- Thin weight man in no acute distress. HEENT- C&S clear, PERRLA, dry mucus membranes, no gingivitis, no visible tooth erosion. Posterior pharynx is clear Neck - supple, no thyromegaly Nodes - negative cervical, supraclavicular nodes. Cor - 2+ radial, DP pulses. RRR w/o murmur. No JVD, no carotid bruits Pulm - normal respirations, lungs CTAP Abd - BS+, soft, guarding or rebound Genitalia - deferred Ext - no deformity Neuro - A&O x 3, CN II - XII normal, motor strength normal, normal gait.  Lab Results  Component Value Date   WBC 4.5 08/06/2012   HGB 15.8 08/06/2012   HCT 45.7 08/06/2012   PLT 114* 08/06/2012   GLUCOSE 116* 08/06/2012   CHOL 166 09/05/2011   TRIG 264.0* 09/05/2011   HDL 30.70* 09/05/2011   LDLDIRECT 103.4 09/05/2011   LDLCALC 96 03/16/2011   ALT 21 08/06/2012   AST 22 08/06/2012   NA 140 08/06/2012   K 4.5 08/06/2012   CL 105 08/06/2012   CREATININE 0.9 08/06/2012   BUN 18.0 08/06/2012   CO2 28 08/06/2012   TSH 3.547 08/06/2012   PSA 0.13 09/05/2011            Assessment & Plan:

## 2012-09-18 NOTE — Assessment & Plan Note (Signed)
Interval history is benign. Physical exam is normal. Lab reviewed - normal labs except for mild thrombocytopenia. No indication to repeat lab. He is current with colorectal cancer screening. Although aged out for prostate cancer screening PSA in the last year was very low - no future screening is recommended. Immunizations are up to date.  In summary - a nice man who has done well with his cancer recovery. He is increasing his food choices and intake. He is very active and has a very positive attitude. He will return in 1 year or sooner as needed.

## 2012-09-18 NOTE — Assessment & Plan Note (Signed)
Very stable. He exercises vigorously and has no chest pain or discomfort or limitations.  Plan  Standard risk reduction.

## 2012-09-18 NOTE — Assessment & Plan Note (Signed)
He reports an increase in nocturia to 2-3. He is otherwise asymptomatic. His nocturia is not having any adverse impact on quality of life.  Plan - Continue present medications.

## 2012-09-18 NOTE — Patient Instructions (Addendum)
Thanks for coming to see me. I appreciate your attitude of appreciation and thankfullness  Your exam is normal. Your December labs were all normal. Your last cholesterol panel was normal and does not need to be rechecked.  A full report is to follow.  Please sign up for MyChart.  Come back to see me in 1 year or sooner as needed.

## 2012-09-26 ENCOUNTER — Encounter (INDEPENDENT_AMBULATORY_CARE_PROVIDER_SITE_OTHER): Payer: Medicare Other

## 2012-09-26 DIAGNOSIS — I6529 Occlusion and stenosis of unspecified carotid artery: Secondary | ICD-10-CM

## 2012-10-01 ENCOUNTER — Encounter: Payer: Medicare Other | Admitting: Internal Medicine

## 2012-10-25 ENCOUNTER — Telehealth: Payer: Self-pay | Admitting: *Deleted

## 2012-10-25 NOTE — Telephone Encounter (Signed)
CALLED PATIENT TO ALTER FU VISIT FOR 11-05-12 DUE TO DR. BEING ON VACATION, RESCHEDULED FOR 11-26-12, LVM FOR A RETURN CALL

## 2012-10-29 ENCOUNTER — Telehealth: Payer: Self-pay

## 2012-10-29 ENCOUNTER — Other Ambulatory Visit (HOSPITAL_BASED_OUTPATIENT_CLINIC_OR_DEPARTMENT_OTHER): Payer: Medicare Other | Admitting: Lab

## 2012-10-29 ENCOUNTER — Encounter: Payer: Self-pay | Admitting: Specialist

## 2012-10-29 DIAGNOSIS — D696 Thrombocytopenia, unspecified: Secondary | ICD-10-CM

## 2012-10-29 LAB — CBC WITH DIFFERENTIAL/PLATELET
Basophils Absolute: 0 10*3/uL (ref 0.0–0.1)
Eosinophils Absolute: 0.1 10*3/uL (ref 0.0–0.5)
HGB: 16.8 g/dL (ref 13.0–17.1)
MONO#: 0.4 10*3/uL (ref 0.1–0.9)
MONO%: 8.5 % (ref 0.0–14.0)
NEUT#: 3.6 10*3/uL (ref 1.5–6.5)
RBC: 5.04 10*6/uL (ref 4.20–5.82)
RDW: 12.4 % (ref 11.0–14.6)
WBC: 4.6 10*3/uL (ref 4.0–10.3)
lymph#: 0.5 10*3/uL — ABNORMAL LOW (ref 0.9–3.3)

## 2012-10-29 NOTE — Telephone Encounter (Signed)
Message copied by Kallie Locks on Tue Oct 29, 2012  2:13 PM ------      Message from: Myrtis Ser      Created: Tue Oct 29, 2012 11:49 AM       Please call pt. Plt count remains stable. Continue observation. ------

## 2012-10-29 NOTE — Progress Notes (Signed)
Phillip [name] stopped by my office today and spent about half an hour bringing me up to date on his activities.  He reports that he is involved in sports and volunteer work and generally enjoying life.  He also expressed his gratitude for the resources that have been available to him in regaining his health.

## 2012-11-05 ENCOUNTER — Ambulatory Visit: Payer: Medicare Other | Admitting: Radiation Oncology

## 2012-11-26 ENCOUNTER — Encounter: Payer: Self-pay | Admitting: Radiation Oncology

## 2012-11-26 ENCOUNTER — Ambulatory Visit
Admission: RE | Admit: 2012-11-26 | Discharge: 2012-11-26 | Disposition: A | Discharge status: Home / Self Care | Payer: Medicare Other | Source: Ambulatory Visit | Attending: Radiation Oncology | Admitting: Radiation Oncology

## 2012-11-26 VITALS — BP 135/70 | HR 67 | Temp 99.0°F | Resp 20 | Wt 218.5 lb

## 2012-11-26 DIAGNOSIS — C801 Malignant (primary) neoplasm, unspecified: Secondary | ICD-10-CM

## 2012-11-26 NOTE — Progress Notes (Signed)
CC: Dr. Narda Bonds  Phillip [name] returns today approximately 2 years and 8 months following completion of chemoradiation in the management of his T1 N1 squamous cell carcinoma the right tonsil. His taste is improved. He is eating a variety of fluids but does supplement his diet with Ensure. He remains active participating in [employer] and also golf. He sees Dr. Gaylyn Rong in late May and Dr. Ezzard Standing again in June. He tells me that his platelet count is slightly increased, and this has been followed by Dr. Gaylyn Rong.  Physical examination: And oriented. Filed Vitals:   11/26/12 1453  BP: 135/70  Pulse: 67  Temp: 99 F (37.2 C)  Resp: 20   Head and neck examination: Nodes: There is no palpable lymphadenopathy in neck. Oral cavity and oropharynx remarkable for mild trismus. There is no visible evidence for recurrent disease along his Oropharynx (right tonsil). Indirect mirror examination confirmatory.  Laboratory data: Lab Results  Component Value Date   WBC 4.6 10/29/2012   HGB 16.8 10/29/2012   HCT 49.0 10/29/2012   MCV 97.1 10/29/2012   PLT 118* 10/29/2012   Impression: No evidence for recurrent disease. He'll see Dr. Gaylyn Rong in late May Dr. Ezzard Standing in June.   Plan: Followup visit with me in December.

## 2012-11-26 NOTE — Progress Notes (Signed)
Pt states he still drinks Ensure Plus but is eating some soft foods now. His sweet and sour tastes have returned. Pt denies pain, fatigue, loss of appetite. He does states that eating is not necessarily pleasurable for him yet.

## 2013-01-16 ENCOUNTER — Ambulatory Visit (HOSPITAL_BASED_OUTPATIENT_CLINIC_OR_DEPARTMENT_OTHER): Payer: Medicare Other | Admitting: Oncology

## 2013-01-16 ENCOUNTER — Other Ambulatory Visit (HOSPITAL_BASED_OUTPATIENT_CLINIC_OR_DEPARTMENT_OTHER): Payer: Medicare Other | Admitting: Lab

## 2013-01-16 ENCOUNTER — Encounter: Payer: Self-pay | Admitting: Oncology

## 2013-01-16 ENCOUNTER — Telehealth: Payer: Self-pay | Admitting: Oncology

## 2013-01-16 VITALS — BP 124/75 | HR 59 | Temp 97.3°F | Resp 18 | Ht 75.0 in | Wt 213.5 lb

## 2013-01-16 DIAGNOSIS — C801 Malignant (primary) neoplasm, unspecified: Secondary | ICD-10-CM

## 2013-01-16 DIAGNOSIS — D696 Thrombocytopenia, unspecified: Secondary | ICD-10-CM

## 2013-01-16 DIAGNOSIS — E039 Hypothyroidism, unspecified: Secondary | ICD-10-CM

## 2013-01-16 HISTORY — DX: Hypothyroidism, unspecified: E03.9

## 2013-01-16 LAB — CBC WITH DIFFERENTIAL/PLATELET
Basophils Absolute: 0 10*3/uL (ref 0.0–0.1)
Eosinophils Absolute: 0.1 10*3/uL (ref 0.0–0.5)
HCT: 49 % (ref 38.4–49.9)
HGB: 16.7 g/dL (ref 13.0–17.1)
LYMPH%: 11 % — ABNORMAL LOW (ref 14.0–49.0)
MONO#: 0.4 10*3/uL (ref 0.1–0.9)
NEUT#: 3 10*3/uL (ref 1.5–6.5)
Platelets: 122 10*3/uL — ABNORMAL LOW (ref 140–400)
RBC: 5.08 10*6/uL (ref 4.20–5.82)
WBC: 4 10*3/uL (ref 4.0–10.3)

## 2013-01-16 LAB — COMPREHENSIVE METABOLIC PANEL (CC13)
Albumin: 3.8 g/dL (ref 3.5–5.0)
BUN: 18.8 mg/dL (ref 7.0–26.0)
CO2: 29 mEq/L (ref 22–29)
Glucose: 108 mg/dl — ABNORMAL HIGH (ref 70–99)
Potassium: 4.7 mEq/L (ref 3.5–5.1)
Sodium: 142 mEq/L (ref 136–145)
Total Bilirubin: 0.78 mg/dL (ref 0.20–1.20)
Total Protein: 6.7 g/dL (ref 6.4–8.3)

## 2013-01-16 LAB — TSH: TSH: 4.308 u[IU]/mL (ref 0.350–4.500)

## 2013-01-16 NOTE — Progress Notes (Signed)
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   PAST THERAPY: Every three week cisplatin and daily radiation therapy was started 01/31/2010. He is status post two cycles of cisplatin. He did not receive his third cycle due to progressive hearing loss and tinnitus. His radiation was initiated on 01/31/2010 and concluded on 03/22/2010.   CURRENT THERAPY: watchful observation.  INTERVAL HISTORY: Phillip [name] 72 y.o. male returns for regular follow up by himself.  He reports feeling well. He has been able to eat regular foods now.  The sense of taste is still off.  However, he is trying.  He still drinks about 6-8 cans of Ensure daily.  His weight is stable.  He plays golf about 3-4 hours almost everyday.    Patient denies fever, anorexia, weight loss, fatigue, headache, visual changes, confusion, drenching night sweats, palpable lymph node swelling, mucositis, odynophagia, dysphagia, nausea vomiting, jaundice, chest pain, palpitation, shortness of breath, dyspnea on exertion, productive cough, gum bleeding, epistaxis, hematemesis, hemoptysis, abdominal pain, abdominal swelling, early satiety, melena, hematochezia, hematuria, skin rash, spontaneous bleeding, joint swelling, joint pain, heat or cold intolerance, bowel bladder incontinence, back pain, focal motor weakness, paresthesia, depression.     Past Medical History  Diagnosis Date  . Diarrhea   . Sinus bradycardia   . Neck pain   . Coronary artery disease   . Unspecified essential hypertension   . History of benign prostatic hypertrophy   . Squamous cell carcinoma     right tonsil  . Thrombocytopenia 02/02/2012  . History of radiation therapy 01/31/10-03/22/10    right tonsil  . HTN (hypertension)   . Anxiety   . Hyperlipidemia     diet controlled  . Thrombocytopenia    unclear etiology  . 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 Tinnitus   . Hearing loss   . Hypothyroid 01/16/2013    Past Surgical History  Procedure Laterality Date  . Tonsillectomy    . Vasectomy    . Knee arthroscopy      left knee  . Dental surgery      extractions  . Peg placement      July 2011    Current Outpatient Prescriptions  Medication Sig Dispense Refill  . aspirin 81 MG tablet Take 81 mg by mouth daily.        Marland Kitchen docusate sodium (COLACE) 100 MG capsule Take 100 mg by mouth daily.        Marland Kitchen dutasteride (AVODART) 0.5 MG capsule Take 1 capsule (0.5 mg total) by mouth daily.  90 capsule  3  . fluticasone (FLONASE) 50 MCG/ACT nasal spray       . ibuprofen (ADVIL,MOTRIN) 200 MG tablet Take 200 mg by mouth as needed.        . Magnesium 200 MG TABS Take by mouth daily.      . Melatonin 3 MG CAPS Take by mouth as needed.        . Multiple Vitamin (MULTIVITAMIN) tablet Take 1 tablet by mouth daily.      . sodium fluoride (FLUORISHIELD) 1.1 % GEL dental gel Place 1 application onto teeth at bedtime. Patient to use as alternative to the PreviDent 5000 Plus gel.  120 mL  prn  . sodium fluoride (PREVIDENT 5000 PLUS) 1.1 % CREA dental cream Apply thin ribbon of  cream to tooth brush. Brush teeth for 2 minutes. Spit out excess-DO NOT swallow. DO NOT rinse afterwards. Repeat nightly.  1 Tube  prn  . tadalafil (CIALIS) 20 MG tablet Take 20 mg by mouth daily as needed.      . zaleplon (SONATA) 10 MG capsule Take 1 capsule (10 mg total) by mouth at bedtime. As needed  30 capsule  2   No current facility-administered medications for this visit.    ALLERGIES:  is allergic to morphine and related.  REVIEW OF SYSTEMS:  The rest of the 14-point review of system was negative.   Filed Vitals:   01/16/13 0953  BP: 124/75  Pulse: 59  Temp: 97.3 F (36.3 C)  Resp: 18   Wt Readings from Last 3 Encounters:  01/16/13 213 lb 8 oz (96.843 kg)  11/26/12 218 lb 8 oz (99.111 kg)  09/18/12 217 lb (98.431  kg)   ECOG Performance status: 0  PHYSICAL EXAMINATION:   General:  well-nourished man in no acute distress.  Eyes:  no scleral icterus.  ENT:  There were no oropharyngeal lesions.  Neck was without thyromegaly.  Lymphatics:  Negative cervical, supraclavicular or axillary adenopathy.  Respiratory: lungs were clear bilaterally without wheezing or crackles.  Cardiovascular:  Regular rate and rhythm, S1/S2, without murmur, rub or gallop.  There was no pedal edema.  GI:  abdomen was soft, flat, nontender, nondistended, without organomegaly.  Muscoloskeletal:  no spinal tenderness of palpation of vertebral spine.  Skin exam was without echymosis, petichae.  Neuro exam was nonfocal.  Patient was able to get on and off exam table without assistance.  Gait was normal.  Patient was alerted and oriented.  Attention was good.   Language was appropriate.  Mood was normal without depression.  Speech was not pressured.  Thought content was not tangential.    LABORATORY/RADIOLOGY DATA:  Lab Results  Component Value Date   WBC 4.0 01/16/2013   HGB 16.7 01/16/2013   HCT 49.0 01/16/2013   PLT 122* 01/16/2013   GLUCOSE 108* 01/16/2013   CHOL 166 09/05/2011   TRIG 264.0* 09/05/2011   HDL 30.70* 09/05/2011   LDLDIRECT 103.4 09/05/2011   LDLCALC 96 03/16/2011   ALKPHOS 75 01/16/2013   ALT 27 01/16/2013   AST 23 01/16/2013   NA 142 01/16/2013   K 4.7 01/16/2013   CL 106 01/16/2013   CREATININE 0.9 01/16/2013   BUN 18.8 01/16/2013   CO2 29 01/16/2013   PSA 0.13 09/05/2011     ASSESSMENT AND PLAN:   1. History of oropharyngeal squamous cell carcinoma:  Continue to be in remission.  He does not smoke, chew tobacco, or drink EtOH.   2. Thrombocytopenia: Unclear etiology. This has been stable.  There is low clinical suspicion with MDS with cisplatin.  He does not have leukopenia or anemia.  Will continue to monitor.   3. Borderline diabetes: Diet control per PCP.  4. Past calorie/protein mal nutrition:  Resolved.  He is  starting to take regular foods.   5. Anxiety and depression. He is off of citalopram with stable mood.  6. Hypertension.  Diet control. 7. Hyperlipidemia. Diet control. 8. Followup: In about 6 months with CBC and return visit in about 1 year.  There is no indication for routine surveillance CT scan as his neck is not fibrotic and he follows ENT and Rad Onc on a routine basis.    I informed Mr. Jutras that I am leaving the practice.  The  [facility] will arrange for him to see another provider when he returns.      The length of time of the face-to-face encounter was 15 minutes. More than 50% of time was spent counseling and coordination of care.

## 2013-01-16 NOTE — Telephone Encounter (Signed)
gv and printed appt sched and avs for pt  °

## 2013-06-26 ENCOUNTER — Other Ambulatory Visit: Payer: Self-pay

## 2013-07-28 ENCOUNTER — Other Ambulatory Visit: Payer: Self-pay | Admitting: Hematology and Oncology

## 2013-07-28 DIAGNOSIS — C801 Malignant (primary) neoplasm, unspecified: Secondary | ICD-10-CM

## 2013-07-29 ENCOUNTER — Telehealth: Payer: Self-pay | Admitting: *Deleted

## 2013-07-29 ENCOUNTER — Telehealth: Payer: Self-pay | Admitting: Internal Medicine

## 2013-07-29 ENCOUNTER — Ambulatory Visit: Payer: Medicare Other | Admitting: Radiation Oncology

## 2013-07-29 ENCOUNTER — Other Ambulatory Visit (HOSPITAL_BASED_OUTPATIENT_CLINIC_OR_DEPARTMENT_OTHER): Payer: Medicare Other | Admitting: Lab

## 2013-07-29 DIAGNOSIS — C801 Malignant (primary) neoplasm, unspecified: Secondary | ICD-10-CM

## 2013-07-29 DIAGNOSIS — C099 Malignant neoplasm of tonsil, unspecified: Secondary | ICD-10-CM

## 2013-07-29 DIAGNOSIS — E039 Hypothyroidism, unspecified: Secondary | ICD-10-CM

## 2013-07-29 LAB — CBC WITH DIFFERENTIAL/PLATELET
BASO%: 0.2 % (ref 0.0–2.0)
Basophils Absolute: 0 10*3/uL (ref 0.0–0.1)
HCT: 50.7 % — ABNORMAL HIGH (ref 38.4–49.9)
HGB: 17.4 g/dL — ABNORMAL HIGH (ref 13.0–17.1)
LYMPH%: 7.4 % — ABNORMAL LOW (ref 14.0–49.0)
MCHC: 34.4 g/dL (ref 32.0–36.0)
MONO#: 0.4 10*3/uL (ref 0.1–0.9)
NEUT%: 81.6 % — ABNORMAL HIGH (ref 39.0–75.0)
Platelets: 145 10*3/uL (ref 140–400)
WBC: 5.1 10*3/uL (ref 4.0–10.3)

## 2013-07-29 LAB — COMPREHENSIVE METABOLIC PANEL (CC13)
ALT: 21 U/L (ref 0–55)
Alkaline Phosphatase: 78 U/L (ref 40–150)
Creatinine: 0.8 mg/dL (ref 0.7–1.3)
Glucose: 115 mg/dl (ref 70–140)
Sodium: 143 mEq/L (ref 136–145)
Total Bilirubin: 0.63 mg/dL (ref 0.20–1.20)
Total Protein: 7.3 g/dL (ref 6.4–8.3)

## 2013-07-29 MED ORDER — DUTASTERIDE 0.5 MG PO CAPS: 0.5000 mg | ORAL_CAPSULE | Freq: Every day | ORAL | Status: DC

## 2013-07-29 NOTE — Telephone Encounter (Signed)
Called pt to inform him of need to change his FU w/Dr Dayton Scrape today due to emergent situation. Pt at [facility] for labs. Met pt in lobby and informed him that Jacolyn Reedy, Media planner will call him this afternoon w/new appointment date/time. Pt verbalized understanding. Pt requests his home phone be called, not his mobile phone. Ms Lyda Perone made aware of pt's request.

## 2013-07-29 NOTE — Telephone Encounter (Signed)
CALLED PATIENT TO RESCHEDULE FU FOR 07-29-13, LVM FOR A RETURN CALL

## 2013-07-29 NOTE — Telephone Encounter (Signed)
Prescription has been sent.

## 2013-07-29 NOTE — Telephone Encounter (Signed)
07/29/2013  Pt came in wanting to get a refill on RX dutasteride (AVODART)  Please contact pt when RX has been sent to pharmacy.

## 2013-08-06 ENCOUNTER — Telehealth: Payer: Self-pay | Admitting: *Deleted

## 2013-08-06 ENCOUNTER — Encounter: Payer: Self-pay | Admitting: Radiation Oncology

## 2013-08-06 ENCOUNTER — Ambulatory Visit
Admission: RE | Admit: 2013-08-06 | Discharge: 2013-08-06 | Disposition: A | Discharge status: Home / Self Care | Payer: Medicare Other | Source: Ambulatory Visit | Attending: Radiation Oncology | Admitting: Radiation Oncology

## 2013-08-06 VITALS — BP 159/87 | HR 66 | Temp 97.4°F | Resp 20 | Wt 197.0 lb

## 2013-08-06 DIAGNOSIS — C801 Malignant (primary) neoplasm, unspecified: Secondary | ICD-10-CM

## 2013-08-06 NOTE — Telephone Encounter (Signed)
Called patient to have him come in to have a chest x-ray, I spoke with this patient and he will be tomorrow morning at 9 am for his chest x-ray.

## 2013-08-06 NOTE — Progress Notes (Signed)
Pt denies pain, swallowing/eating difficulties. He does have dry mouth. Pt drinks Ensure for most of his nutritional needs, does eat soft foods at times. He states his taste buds are not normal; foods do not taste as they used to, so eating is not pleasurable. Pt has FU w/Dr Norrins PCP Feb 2015, FU w/Dr [facility] March 2015, FU w/med onc May 2015.

## 2013-08-06 NOTE — Telephone Encounter (Signed)
Called patient to inform of test, spoke with patient and he is aware of this test. 

## 2013-08-06 NOTE — Progress Notes (Signed)
CC: Dr. Ovidio Kin, Dr. Oliver Barre  Followup note:  Mr. Bose returns today approximately 3 years and 5 months following completion of chemoradiation in the management of his T1 N1 squamous cell carcinoma the right tonsil. He still attending most of his calories with Ensure. However, he is expanding for many. He states that "eating is not a pleasure". He he saw Dr. Ezzard Standing this past June and will see him again in March of 2015. Dr. Gaylyn Rong with her blood work before his departure, and his platelet count is now back up to normal. His hemoglobin is slightly elevated. He maintains close dental followup. Of note is that his weight is down 15 pounds over the past 6 months. He remains quite active. He denies respiratory symptoms.  Physical examination: Alert and oriented. Wt Readings from Last 3 Encounters:  08/06/13 197 lb (89.359 kg)  01/16/13 213 lb 8 oz (96.843 kg)  11/26/12 218 lb 8 oz (99.111 kg)   Temp Readings from Last 3 Encounters:  08/06/13 97.4 F (36.3 C) Oral  01/16/13 97.3 F (36.3 C) Oral  11/26/12 99 F (37.2 C) Oral   BP Readings from Last 3 Encounters:  08/06/13 159/87  01/16/13 124/75  11/26/12 135/70   Pulse Readings from Last 3 Encounters:  08/06/13 66  01/16/13 59  11/26/12 67   Physical examination: Alert and oriented. Nodes: There is no palpable lymphadenopathy in the neck. Oral cavity and oropharynx are unremarkable to inspection. He does have mild xerostomia. Indirect mirror examination likewise without evidence for recurrent disease. Palpation of the right oropharynx is unremarkable.  Laboratory data: Lab Results  Component Value Date   WBC 5.1 07/29/2013   HGB 17.4* 07/29/2013   HCT 50.7* 07/29/2013   MCV 98.5* 07/29/2013   PLT 145 07/29/2013   CMP     Component Value Date/Time   NA 143 07/29/2013 0959   NA 144 01/30/2012 1154   NA 139 09/05/2011 1215   K 3.8 07/29/2013 0959   K 4.7 01/30/2012 1154   K 4.6 09/05/2011 1215   CL 106 01/16/2013 0938   CL  101 01/30/2012 1154   CL 102 09/05/2011 1215   CO2 24 07/29/2013 0959   CO2 31 01/30/2012 1154   CO2 30 09/05/2011 1215   GLUCOSE 115 07/29/2013 0959   GLUCOSE 108* 01/16/2013 0938   GLUCOSE 103 01/30/2012 1154   GLUCOSE 109* 09/05/2011 1215   BUN 15.0 07/29/2013 0959   BUN 18 01/30/2012 1154   BUN 18 09/05/2011 1215   CREATININE 0.8 07/29/2013 0959   CREATININE 1.1 01/30/2012 1154   CREATININE 0.8 09/05/2011 1215   CALCIUM 9.8 07/29/2013 0959   CALCIUM 9.3 01/30/2012 1154   CALCIUM 9.4 09/05/2011 1215   PROT 7.3 07/29/2013 0959   PROT 7.1 01/30/2012 1154   PROT 6.3 09/05/2011 1215   PROT 6.3 09/05/2011 1215   ALBUMIN 4.1 07/29/2013 0959   ALBUMIN 4.1 09/05/2011 1215   ALBUMIN 4.1 09/05/2011 1215   AST 22 07/29/2013 0959   AST 27 01/30/2012 1154   AST 20 09/05/2011 1215   AST 20 09/05/2011 1215   ALT 21 07/29/2013 0959   ALT 28 01/30/2012 1154   ALT 25 09/05/2011 1215   ALT 25 09/05/2011 1215   ALKPHOS 78 07/29/2013 0959   ALKPHOS 64 01/30/2012 1154   ALKPHOS 56 09/05/2011 1215   ALKPHOS 56 09/05/2011 1215   BILITOT 0.63 07/29/2013 0959   BILITOT 0.80 01/30/2012 1154   BILITOT 0.6 09/05/2011 1215  BILITOT 0.6 09/05/2011 1215   GFRNONAA >60 03/16/2010 0550   GFRAA  Value: >60        The eGFR has been calculated using the MDRD equation. This calculation has not been validated in all clinical situations. eGFR's persistently <60 mL/min signify possible Chronic Kidney Disease. 03/16/2010 0550   Impression satisfactory progress with no evidence for recurrent disease. However, I am concerned about his weight loss of 15 pounds over the past 6 months. In view of his weight loss, I would like to obtain a chest x-ray even in the absence of pulmonary symptoms.  Plan: We will go ahead and get a chest x-ray. Followup with Dr. Ezzard Standing in March, and Dr. Arthur Holms in February. He'll see medical oncology in may and return to see me for a followup visit in September of 2015.

## 2013-08-07 ENCOUNTER — Ambulatory Visit (HOSPITAL_COMMUNITY)
Admission: RE | Admit: 2013-08-07 | Discharge: 2013-08-07 | Disposition: A | Discharge status: Home / Self Care | Payer: Medicare Other | Source: Ambulatory Visit | Attending: Radiation Oncology | Admitting: Radiation Oncology

## 2013-08-07 ENCOUNTER — Telehealth: Payer: Self-pay | Admitting: *Deleted

## 2013-08-07 ENCOUNTER — Encounter: Payer: Self-pay | Admitting: *Deleted

## 2013-08-07 DIAGNOSIS — Z85819 Personal history of malignant neoplasm of unspecified site of lip, oral cavity, and pharynx: Secondary | ICD-10-CM | POA: Insufficient documentation

## 2013-08-07 DIAGNOSIS — C801 Malignant (primary) neoplasm, unspecified: Secondary | ICD-10-CM

## 2013-08-07 DIAGNOSIS — R634 Abnormal weight loss: Secondary | ICD-10-CM | POA: Insufficient documentation

## 2013-08-07 NOTE — Progress Notes (Signed)
Pt in nursing following chest x ray for weight check. Pt states he weighed nude this morning on his scale at home 207 lb. His weight on rad onc scale 215.4 lb. Will inform Dr Dayton Scrape.

## 2013-08-07 NOTE — Telephone Encounter (Signed)
Spoke w/ Dr Dayton Scrape who stated he reviewed pt's chest x ray and instructed this RN to inform pt his chest x ray is normal. Called pt's home, spoke w/wife and informed her pt's chest x ray this morning is normal. Wife verbalized understanding, appreciation.

## 2013-08-11 ENCOUNTER — Other Ambulatory Visit: Payer: Self-pay | Admitting: Internal Medicine

## 2013-08-11 NOTE — Telephone Encounter (Signed)
Zaleplon called to pharmacy.

## 2013-10-16 ENCOUNTER — Encounter: Payer: Commercial Managed Care - HMO | Admitting: Internal Medicine

## 2013-10-16 DIAGNOSIS — Z0289 Encounter for other administrative examinations: Secondary | ICD-10-CM

## 2013-11-04 ENCOUNTER — Ambulatory Visit (INDEPENDENT_AMBULATORY_CARE_PROVIDER_SITE_OTHER): Payer: Medicare HMO | Admitting: Internal Medicine

## 2013-11-04 ENCOUNTER — Encounter: Payer: Self-pay | Admitting: Internal Medicine

## 2013-11-04 ENCOUNTER — Ambulatory Visit (INDEPENDENT_AMBULATORY_CARE_PROVIDER_SITE_OTHER)
Admission: RE | Admit: 2013-11-04 | Discharge: 2013-11-04 | Disposition: A | Discharge status: Home / Self Care | Payer: Medicare HMO | Source: Ambulatory Visit | Attending: Internal Medicine | Admitting: Internal Medicine

## 2013-11-04 VITALS — BP 140/68 | HR 62 | Temp 98.9°F | Ht 75.0 in | Wt 214.5 lb

## 2013-11-04 DIAGNOSIS — I739 Peripheral vascular disease, unspecified: Secondary | ICD-10-CM

## 2013-11-04 DIAGNOSIS — I6523 Occlusion and stenosis of bilateral carotid arteries: Secondary | ICD-10-CM | POA: Insufficient documentation

## 2013-11-04 DIAGNOSIS — M25551 Pain in right hip: Secondary | ICD-10-CM

## 2013-11-04 DIAGNOSIS — I2510 Atherosclerotic heart disease of native coronary artery without angina pectoris: Secondary | ICD-10-CM

## 2013-11-04 DIAGNOSIS — R6882 Decreased libido: Secondary | ICD-10-CM

## 2013-11-04 DIAGNOSIS — M25559 Pain in unspecified hip: Secondary | ICD-10-CM

## 2013-11-04 DIAGNOSIS — I779 Disorder of arteries and arterioles, unspecified: Secondary | ICD-10-CM

## 2013-11-04 DIAGNOSIS — I10 Essential (primary) hypertension: Secondary | ICD-10-CM

## 2013-11-04 DIAGNOSIS — Z0000 Encounter for general adult medical examination without abnormal findings: Secondary | ICD-10-CM

## 2013-11-04 DIAGNOSIS — N529 Male erectile dysfunction, unspecified: Secondary | ICD-10-CM

## 2013-11-04 DIAGNOSIS — D492 Neoplasm of unspecified behavior of bone, soft tissue, and skin: Secondary | ICD-10-CM

## 2013-11-04 DIAGNOSIS — M79609 Pain in unspecified limb: Secondary | ICD-10-CM

## 2013-11-04 DIAGNOSIS — Z87898 Personal history of other specified conditions: Secondary | ICD-10-CM

## 2013-11-04 DIAGNOSIS — E039 Hypothyroidism, unspecified: Secondary | ICD-10-CM

## 2013-11-04 DIAGNOSIS — M79604 Pain in right leg: Secondary | ICD-10-CM

## 2013-11-04 DIAGNOSIS — C801 Malignant (primary) neoplasm, unspecified: Secondary | ICD-10-CM

## 2013-11-04 NOTE — Progress Notes (Signed)
Subjective:    Patient ID: Phillip [name], male    DOB: 1941-05-06, 73 y.o.   MRN: [id number]  HPI The patient is here for annual Medicare wellness examination and management of other chronic and acute problems.  In the interval history he has been dong well with no major medical problems. He has been eating more but still using Ensure plus Rx provided: #240/month, sig 2 qid, 11 refills. With eating he will have nocturnal reflux. He does get rapid relief with ranitidine  He does Senior games; he hikes; living the good life.   ED - cialis does not work. Referral to Burnadette Pop - needs referral to [facility] for skin neoplasm.  Nocturia - on Avodart but still with nocturia x 3. Will also discuss with urologist.  Nail thickening - mild fungal infection. May have trimmed in preference to oral antifungal.   Has not had lipid panel since Jan '13 - was normal.   He will get right hip pain when laying on his right side.    The risk factors are reflected in the social history.  The roster of all physicians providing medical care to patient - is listed in the Snapshot section of the chart.  Activities of daily living:  The patient is 100% inedpendent in all ADLs: dressing, toileting, feeding as well as independent mobility  Home safety : The patient has smoke detectors in the home. Falls - none They wear seatbelts. No firearms at home . There is no violence in the home.   There is no risks for hepatitis, STDs or HIV. There is no   history of blood transfusion. They have no travel history to infectious disease endemic areas of the world.  The patient has  seen their dentist in the last six month. They have seen their eye doctor in the last year. They admit to any hearing difficulty and have not had audiologic testing in the last year but has had hearing aids checked marach '15.    They do not  have excessive sun exposure. Discussed the need for sun protection: hats, long sleeves  and use of sunscreen if there is significant sun exposure.   Diet: the importance of a healthy diet is discussed. They do have a healthy diet.  The patient has a regular exercise program: senior gsames, walking, hiking,  , 60 min  Minimum duration, 7 per week.  The benefits of regular aerobic exercise were discussed.  Depression screen: there are no signs or vegative symptoms of depression- irritability, change in appetite, anhedonia, sadness/tearfullness.  Cognitive assessment: the patient manages all their financial and personal affairs and is actively engaged.   The following portions of the patient's history were reviewed and updated as appropriate: allergies, current medications, past family history, past medical history,  past surgical history, past social history  and problem list.  Vision, hearing, body mass index were assessed and reviewed.   During the course of the visit the patient was educated and counseled about appropriate screening and preventive services including : fall prevention , diabetes screening, nutrition counseling, colorectal cancer screening, and recommended immunizations.  Past Medical History  Diagnosis Date  . Diarrhea   . Sinus bradycardia   . Neck pain   . Coronary artery disease   . Unspecified essential hypertension   . History of benign prostatic hypertrophy   . Squamous cell carcinoma     right tonsil  . Thrombocytopenia 02/02/2012  . History of radiation therapy  01/31/10-03/22/10    right tonsil  . HTN (hypertension)   . Anxiety   . Hyperlipidemia     diet controlled  . Thrombocytopenia     unclear etiology  . 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 Tinnitus   . Hearing loss   . Hypothyroid 01/16/2013   Past Surgical History  Procedure Laterality Date  . Tonsillectomy    . Vasectomy    . Knee arthroscopy      left knee  . Dental surgery      extractions  . Peg placement      July 2011   Family History  Problem Relation Age of Onset  .  Coronary artery disease Father   . Heart attack Father   . Heart disease Father   . Colon cancer Neg Hx   . Esophageal cancer Neg Hx   . Rectal cancer Neg Hx   . Stomach cancer Neg Hx    History   Social History  . Marital Status: Married    Spouse Name: N/A    Number of Children: N/A  . Years of Education: N/A   Occupational History  . Not on file.   Social History Main Topics  . Smoking status: Never Smoker   . Smokeless tobacco: Never Used  . Alcohol Use: No  . Drug Use: No  . Sexual Activity: Not on file   Other Topics Concern  . 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   [place], remains very active with an interest in politics. Is thinking of moving to the greater [facility] area. Jan 11' moved to a [place] outside of [place].     Current Outpatient Prescriptions on File Prior to Visit  Medication Sig Dispense Refill  . ADACEL 12-20-13.5 LF-MCG/0.5 injection       . aspirin 81 MG tablet Take 81 mg by mouth daily.        Marland Kitchen dutasteride (AVODART) 0.5 MG capsule Take 1 capsule (0.5 mg total) by mouth daily.  90 capsule  3  . FLUZONE HIGH-DOSE injection       . ibuprofen (ADVIL,MOTRIN) 200 MG tablet Take 200 mg by mouth as needed.        . Magnesium 200 MG TABS Take by mouth daily.      . Melatonin 3 MG CAPS Take by mouth as needed.        . Multiple Vitamin (MULTIVITAMIN) tablet Take 1 tablet by mouth daily.      . tadalafil (CIALIS) 20 MG tablet Take 20 mg by mouth daily as needed.      . zaleplon (SONATA) 10 MG capsule TAKE ONE CAPSULE BY MOUTH AT BEDTIME AS NEEDED  30 capsule  0  . docusate sodium (COLACE) 100 MG capsule Take 100 mg by mouth daily.        . fluticasone (FLONASE) 50 MCG/ACT nasal spray        No current facility-administered medications on file  prior to visit.     Review of Systems Constitutional:  Negative for fever, chills, activity change and unexpected weight change.  HEENT:  Negative for hearing loss, ear pain, congestion, neck stiffness and postnasal drip. Negative for sore throat or swallowing problems. Negative for dental complaints.   Eyes: Negative for vision  loss or change in visual acuity.  Respiratory: Negative for chest tightness and wheezing. Negative for DOE.   Cardiovascular: Negative for chest pain or palpitations. No decreased exercise tolerance Gastrointestinal: No change in bowel habit. No bloating or gas. No reflux or indigestion Genitourinary: Negative for urgency, frequency, flank pain and difficulty urinating.  Musculoskeletal: Negative for myalgias, back pain, arthralgias and gait problem.  Neurological: Negative for dizziness, tremors, weakness and headaches.  Hematological: Negative for adenopathy.  Psychiatric/Behavioral: Negative for behavioral problems and dysphoric mood.       Objective:   Physical Exam Filed Vitals:   11/04/13 1453  BP: 140/68  Pulse: 62  Temp: 98.9 F (37.2 C)   Wt Readings from Last 3 Encounters:  11/04/13 214 lb 8 oz (97.297 kg)  08/07/13 215 lb 6.4 oz (97.705 kg)  08/06/13 197 lb (89.359 kg)   Gen'l: Well nourished well developed male in no acute distress  HEENT: Head: Normocephalic and atraumatic. Right Ear: External ear normal. EAC/TM nl. Left Ear: External ear normal.  EAC/TM nl. Nose: Nose normal. Mouth/Throat: Oropharynx is clear and dry (detailed exam deferred to oncology). Dentition - native, in good repair. No buccal or palatal lesions. Posterior pharynx clear. Eyes: Conjunctivae and sclera clear. EOM intact. Pupils are equal, round, and reactive to light. Right eye exhibits no discharge. Left eye exhibits no discharge. Neck: Normal range of motion. Neck supple. No JVD present. No tracheal deviation present. No thyromegaly present.  Cardiovascular: Normal  rate, regular rhythm, no gallop, no friction rub, no murmur heard.      Quiet precordium. 2+ radial and DP pulses . No carotid bruits Pulmonary/Chest: Effort normal. No respiratory distress or increased WOB, no wheezes, no rales. No chest wall deformity or CVAT. Abdomen: Soft. Bowel sounds are normal in all quadrants. He exhibits no distension, no tenderness, no rebound or guarding, No heptosplenomegaly  Genitourinary:  deferred Musculoskeletal: Normal range of motion. He exhibits no edema and no tenderness.       Small and large joints without redness, synovial thickening or deformity. Full range of motion preserved about all small, median and large joints.  Lymphadenopathy:    He has no cervical or supraclavicular adenopathy.  Neurological: He is alert and oriented to person, place, and time. CN II-XII intact. DTRs 2+ and symmetrical biceps, radial and patellar tendons. Cerebellar function normal with no tremor, rigidity, normal gait and station.  Skin: Skin is warm and dry. No rash noted. No erythema.  Psychiatric: He has a normal mood and affect. His behavior is normal. Thought content normal.         Assessment & Plan:

## 2013-11-04 NOTE — Patient Instructions (Signed)
In the interval history he has been dong well with no major medical problems. He has been eating more but still using Ensure plus Rx provided: #240/month, sig 2 qid, 11 refills. With eating he will have nocturnal reflux. He does get rapid relief with ranitidine  He does Senior games; he hikes; living the good life.   ED - cialis does not work. Referral to Burnadette Pop - needs referral to [facility] for skin neoplasm.  Nocturia - on Avodart but still with nocturia x 3. Will also discuss with urologist.  Nail thickening - mild fungal infection. May have trimmed in preference to oral antifungal.   Has not had lipid panel since Jan '13 - was normal.   He will get right hip pain when laying on his right side.

## 2013-11-04 NOTE — Progress Notes (Signed)
Pre visit review using our clinic review tool, if applicable. No additional management support is needed unless otherwise documented below in the visit note. 

## 2013-11-05 ENCOUNTER — Other Ambulatory Visit: Payer: Self-pay | Admitting: Internal Medicine

## 2013-11-05 ENCOUNTER — Ambulatory Visit: Payer: Commercial Managed Care - HMO | Admitting: *Deleted

## 2013-11-05 DIAGNOSIS — R936 Abnormal findings on diagnostic imaging of limbs: Secondary | ICD-10-CM

## 2013-11-05 DIAGNOSIS — Z23 Encounter for immunization: Secondary | ICD-10-CM

## 2013-11-05 DIAGNOSIS — M79604 Pain in right leg: Secondary | ICD-10-CM

## 2013-11-05 MED ORDER — PNEUMOCOCCAL 13-VAL CONJ VACC IM SUSP
0.5000 mL | Freq: Once | INTRAMUSCULAR | Status: AC
  Administered 2013-11-05: 0.5 mL via INTRAMUSCULAR

## 2013-11-05 NOTE — Assessment & Plan Note (Signed)
right tonsil. s/p xrt and chemo with cis-platinum. Had loss of taste and chronic xerostomia. He is doing better - increased saliva, able to eat some foods with enjoyment but still relies on Ensure HN for nutritional support.   Plan Rx for ensure HN

## 2013-11-05 NOTE — Assessment & Plan Note (Signed)
Bilateral hip and pelvic x-ray: IMPRESSION:  Nonspecific sclerosis at the proximal right femoral diaphysis.  Etiology of this finding is uncertain.  If patient has persistent symptoms which could be referable to the  proximal right femur, consider radionuclide bone scan or potentially  CT imaging for further evaluation.  Plan For Bone scan to r/o metastatic disease to femur.

## 2013-11-05 NOTE — Assessment & Plan Note (Signed)
Known moderate carotid disease.  Plan Schedule f/u carotid doppler  Risk factor modification

## 2013-11-05 NOTE — Assessment & Plan Note (Signed)
Has tried and has had diminishing response to cialis. Still sexually active and wants treatment.   Plan Refer to Dr. Risa Grill for cavernous injection treatment

## 2013-11-05 NOTE — Assessment & Plan Note (Signed)
BP Readings from Last 3 Encounters:  11/04/13 140/68  08/06/13 159/87  01/16/13 124/75   He monitors his BP at home. He is advised to call if SBP consistently 150+ based on JNC 8

## 2013-11-05 NOTE — Assessment & Plan Note (Signed)
Patient with known decreased testosterone level. This has been reviewed with him and he still does not want to consider testosterone replacement.

## 2013-11-05 NOTE — Assessment & Plan Note (Signed)
Interval history is benign: he has been very physically active, active in church and generally has made a good adjustment to his state and station. Physical exam is unremarkable except for post-radiation changes to the neck. Labs pending. He is current with colonoscopy. Last PSA '13 was normal and he has aged out of further screening (AUA April '13). Immunizations are up to date.  In summary  A nice man with a significant number of co morbidities who appears medically stable at this visit. He will be transferring care to [facility] - Dr. Glori Bickers.

## 2013-11-05 NOTE — Assessment & Plan Note (Signed)
Increasing nocturia despite avodart.  Plan Trial of rapaflo  Referral to Dr. Risa Grill

## 2013-11-05 NOTE — Assessment & Plan Note (Signed)
No cardiac complaints 

## 2013-11-05 NOTE — Assessment & Plan Note (Signed)
Stable thyroid function

## 2013-11-11 ENCOUNTER — Ambulatory Visit (HOSPITAL_COMMUNITY): Payer: Self-pay

## 2013-11-11 ENCOUNTER — Encounter (HOSPITAL_COMMUNITY): Payer: Self-pay

## 2013-11-12 ENCOUNTER — Encounter (HOSPITAL_COMMUNITY)
Admission: RE | Admit: 2013-11-12 | Discharge: 2013-11-12 | Disposition: A | Discharge status: Home / Self Care | Payer: Medicare HMO | Source: Ambulatory Visit | Attending: Internal Medicine | Admitting: Internal Medicine

## 2013-11-12 ENCOUNTER — Telehealth: Payer: Self-pay | Admitting: Hematology and Oncology

## 2013-11-12 DIAGNOSIS — M79609 Pain in unspecified limb: Secondary | ICD-10-CM | POA: Insufficient documentation

## 2013-11-12 DIAGNOSIS — R9389 Abnormal findings on diagnostic imaging of other specified body structures: Secondary | ICD-10-CM | POA: Insufficient documentation

## 2013-11-12 DIAGNOSIS — M79604 Pain in right leg: Secondary | ICD-10-CM

## 2013-11-12 DIAGNOSIS — R936 Abnormal findings on diagnostic imaging of limbs: Secondary | ICD-10-CM

## 2013-11-12 MED ORDER — TECHNETIUM TC 99M MEDRONATE IV KIT
26.4000 | PACK | Freq: Once | INTRAVENOUS | Status: AC | PRN
  Administered 2013-11-12: 26.4 via INTRAVENOUS

## 2013-11-12 NOTE — Telephone Encounter (Signed)
, °

## 2013-11-17 ENCOUNTER — Encounter: Payer: Self-pay | Admitting: Cardiology

## 2013-11-17 ENCOUNTER — Ambulatory Visit (HOSPITAL_COMMUNITY): Payer: Medicare HMO | Attending: Cardiology | Admitting: Cardiology

## 2013-11-17 DIAGNOSIS — I779 Disorder of arteries and arterioles, unspecified: Secondary | ICD-10-CM

## 2013-11-17 DIAGNOSIS — I6529 Occlusion and stenosis of unspecified carotid artery: Secondary | ICD-10-CM

## 2013-11-17 DIAGNOSIS — I658 Occlusion and stenosis of other precerebral arteries: Secondary | ICD-10-CM | POA: Insufficient documentation

## 2013-11-17 DIAGNOSIS — I739 Peripheral vascular disease, unspecified: Secondary | ICD-10-CM

## 2013-11-17 NOTE — Progress Notes (Signed)
Carotid duplex complete 

## 2013-12-04 ENCOUNTER — Other Ambulatory Visit: Payer: Self-pay | Admitting: *Deleted

## 2013-12-04 NOTE — Telephone Encounter (Signed)
Received fax from [facility] requesting Rx refill. On fax they have marked Dr. Linda Hedges name out and put Dr. Marliss Coots name on Rx. PCP in [facility] is Dr. Glori Bickers but pt hasn't been seen here before and doesn't have an establish care appt.  Called pt to see if he was going to schedule an est. care appt with Dr. Glori Bickers, but didn't answer so I left voicemail requesting pt to call our office

## 2013-12-08 ENCOUNTER — Other Ambulatory Visit: Payer: Self-pay | Admitting: *Deleted

## 2013-12-08 MED ORDER — ZALEPLON 10 MG PO CAPS: ORAL_CAPSULE | ORAL | Status: DC

## 2013-12-08 NOTE — Telephone Encounter (Signed)
I am happy to wait to see him until next March- when his medicare wellness is due- since he was recently seen (as long as he does not have any problems that require attention before that)  We can refill the sonata for 6 months - (that is as long as the pharmacy will usually allow because it is a controlled substance)- so the pharmacy just needs to call in 6 months for a renewal  Please call that in-thanks

## 2013-12-08 NOTE — Telephone Encounter (Signed)
Pt never responded to my phone call/voicemail so Rx declined and advise pharmacy that pt needs appt with our office 1st

## 2013-12-08 NOTE — Telephone Encounter (Signed)
Addressed through another phone note 

## 2013-12-08 NOTE — Telephone Encounter (Signed)
Pt is a Dr. Crissie [name] Pt who needs refill request on Rx, refill was sent to Dr. Crissie [name] office and their office forwarded Rx to [place] because they are no longer refilling pt's meds. Pt only has 3 pills left and has an est care appt with you next Monday 12/15/13 at 2:00pm, please advise

## 2013-12-08 NOTE — Telephone Encounter (Signed)
Pt left message requesting cb.

## 2013-12-08 NOTE — Telephone Encounter (Signed)
Rx called in as directed. Message left advising patient and advised that he could cancel Monday's upcoming appt if he would like and just schedule his Wellness visit for March unless he had issues that needed to be addressed sooner.

## 2013-12-15 ENCOUNTER — Encounter: Payer: Self-pay | Admitting: Family Medicine

## 2013-12-15 ENCOUNTER — Ambulatory Visit (INDEPENDENT_AMBULATORY_CARE_PROVIDER_SITE_OTHER): Payer: Commercial Managed Care - HMO | Admitting: Family Medicine

## 2013-12-15 VITALS — BP 150/90 | HR 70 | Temp 98.4°F | Ht 75.0 in | Wt 212.5 lb

## 2013-12-15 DIAGNOSIS — M25551 Pain in right hip: Secondary | ICD-10-CM | POA: Insufficient documentation

## 2013-12-15 DIAGNOSIS — I10 Essential (primary) hypertension: Secondary | ICD-10-CM

## 2013-12-15 DIAGNOSIS — B351 Tinea unguium: Secondary | ICD-10-CM

## 2013-12-15 DIAGNOSIS — M25559 Pain in unspecified hip: Secondary | ICD-10-CM

## 2013-12-15 DIAGNOSIS — E039 Hypothyroidism, unspecified: Secondary | ICD-10-CM

## 2013-12-15 MED ORDER — LISINOPRIL 20 MG PO TABS: 20.0000 mg | ORAL_TABLET | Freq: Every day | ORAL | Status: DC

## 2013-12-15 NOTE — Assessment & Plan Note (Signed)
Hypothyroidism  Pt has no clinical changes No change in energy level/ hair or skin/ edema and no tremor Lab Results  Component Value Date   TSH 3.657 07/29/2013

## 2013-12-15 NOTE — Assessment & Plan Note (Signed)
Pt will be treated by derm Dr Nicole Kindred- plans to begin lamasil at a later date  We are getting LFT at his labs in May and will make sure she gets a copy

## 2013-12-15 NOTE — Patient Instructions (Signed)
I will add lipid and liver tests and kidney test  to upcoming labs in May (on May 11) Start on lisinopril 20 mg daily  If any problems or side effects let me know Eat healthy and stay active  Follow up with me in Sept  Schedule annual exam in Feb with labs prior

## 2013-12-15 NOTE — Progress Notes (Signed)
Subjective:    Patient ID: Phillip [name], male    DOB: 01/02/1941, 73 y.o.   MRN: [id number]  HPI Here to establish with me from Dr Linda Hedges - who retired   His last wellness check was in February   Has had HTN since HS  Was athletic and even played college football  Then he was a runner for 17 years - marathons  Then tore his meniscus - did have surgery for that  Has jogged on and off since then  Stopped bp after he lost wt   Still runs and also plays in senior games   BP Readings from Last 3 Encounters:  12/15/13 150/90  11/04/13 140/68  08/06/13 159/87   he uses a wrist cuff for bp - it has been accurate and tested- and his bp has been going up into the 160s/some 90s  He used to be on lisinopril     Chemistry      Component Value Date/Time   NA 143 07/29/2013 0959   NA 144 01/30/2012 1154   NA 139 09/05/2011 1215   K 3.8 07/29/2013 0959   K 4.7 01/30/2012 1154   K 4.6 09/05/2011 1215   CL 106 01/16/2013 0938   CL 101 01/30/2012 1154   CL 102 09/05/2011 1215   CO2 24 07/29/2013 0959   CO2 31 01/30/2012 1154   CO2 30 09/05/2011 1215   BUN 15.0 07/29/2013 0959   BUN 18 01/30/2012 1154   BUN 18 09/05/2011 1215   CREATININE 0.8 07/29/2013 0959   CREATININE 1.1 01/30/2012 1154   CREATININE 0.8 09/05/2011 1215      Component Value Date/Time   CALCIUM 9.8 07/29/2013 0959   CALCIUM 9.3 01/30/2012 1154   CALCIUM 9.4 09/05/2011 1215   ALKPHOS 78 07/29/2013 0959   ALKPHOS 64 01/30/2012 1154   ALKPHOS 56 09/05/2011 1215   ALKPHOS 56 09/05/2011 1215   AST 22 07/29/2013 0959   AST 27 01/30/2012 1154   AST 20 09/05/2011 1215   AST 20 09/05/2011 1215   ALT 21 07/29/2013 0959   ALT 28 01/30/2012 1154   ALT 25 09/05/2011 1215   ALT 25 09/05/2011 1215   BILITOT 0.63 07/29/2013 0959   BILITOT 0.80 01/30/2012 1154   BILITOT 0.6 09/05/2011 1215   BILITOT 0.6 09/05/2011 1215       He has had a few occurances of pain in mid chest - that used to happen with inc bp? He thinks  bp was high in those instances    He took a lisinopril pill several times    He was on avodart for BPH Has seen Dr Risa Grill for this and also ED - will be following up soon - and was changed to myrbetriq instead  cialis did not work   He has trouble going back to sleep sometimes after getting up to go to the bathroom  Uses sonata occasionally and it workw well    Some CAD on cath after stress test (fairly mild) He went on a study for crestor (though his chol was not high) Medically managed Also mild carotid stenosis that is followed   Is due for lipids at his upcoming lab draw in May  Hx of squamous cell cancer of the tonsil  oncol and Dr Lucia Gaskins Was treated and continues to follow up  Rad and chemo Some hearing loss from chemo Dr Valere Dross and Dr Lamonte Sakai  He saw Dr Nicole Kindred for derm -has toenail fungus  Has px  for lamasil - and will need baseline liver tests at his lab draw in May Did a nail culture as well   Having some pain in the outer R hip when he climbs stairs  Also hurts to lie on that side  Had xray and also a bone scan  Pain goes all the way from there to his lower leg  No back pain at all   Patient Active Problem List   Diagnosis Date Noted  . Right leg pain 11/05/2013  . Impotence, organic 11/04/2013  . Bilateral carotid artery disease 11/04/2013  . Hypothyroid 01/16/2013  . Thrombocytopenia 02/02/2012  . Health care maintenance 09/21/2011  . Decreased libido 06/23/2010  . CARCINOMA, SQUAMOUS CELL 03/28/2010  . DIARRHEA 10/29/2009  . SINUS BRADYCARDIA 09/04/2008  . HYPERTENSION 06/24/2007  . CORONARY ARTERY DISEASE 06/24/2007  . NECK PAIN 06/24/2007  . BENIGN PROSTATIC HYPERTROPHY, HX OF 06/24/2007   Past Medical History  Diagnosis Date  . Diarrhea   . Sinus bradycardia   . Neck pain   . Coronary artery disease   . Unspecified essential hypertension   . History of benign prostatic hypertrophy   . Squamous cell carcinoma     right tonsil  . Thrombocytopenia 02/02/2012  . History of  radiation therapy 01/31/10-03/22/10    right tonsil  . HTN (hypertension)   . Anxiety   . Hyperlipidemia     diet controlled  . Thrombocytopenia     unclear etiology  . 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 Tinnitus   . Hearing loss   . Hypothyroid 01/16/2013   Past Surgical History  Procedure Laterality Date  . Tonsillectomy    . Vasectomy    . Knee arthroscopy      left knee  . Dental surgery      extractions  . Peg placement      July 2011   History  Substance Use Topics  . Smoking status: Never Smoker   . Smokeless tobacco: Never Used  . Alcohol Use: No   Family History  Problem Relation Age of Onset  . Coronary artery disease Father   . Heart attack Father   . Heart disease Father   . Colon cancer Neg Hx   . Esophageal cancer Neg Hx   . Rectal cancer Neg Hx   . Stomach cancer Neg Hx    Allergies  Allergen Reactions  . Morphine And Related Swelling   Current Outpatient Prescriptions on File Prior to Visit  Medication Sig Dispense Refill  . ADACEL 12-20-13.5 LF-MCG/0.5 injection       . aspirin 81 MG tablet Take 81 mg by mouth daily.        Marland Kitchen docusate sodium (STOOL SOFTENER) 250 MG capsule Take 250 mg by mouth daily.      . fluticasone (FLONASE) 50 MCG/ACT nasal spray       . FLUZONE HIGH-DOSE injection       . ibuprofen (ADVIL,MOTRIN) 200 MG tablet Take 200 mg by mouth as needed.        . Magnesium 200 MG TABS Take by mouth daily.      . Melatonin 3 MG CAPS Take 1 capsule by mouth daily.       . Multiple Vitamin (MULTIVITAMIN) tablet Take 1 tablet by mouth daily.      Marland Kitchen Resveratrol 250 MG CAPS Take 250 mg by mouth daily.      . vitamin C (ASCORBIC ACID) 500 MG tablet Take 500  mg by mouth daily.      . vitamin E 400 UNIT capsule Take 400 Units by mouth daily.       No current facility-administered medications on file prior to visit.     Review of Systems Review of Systems  Constitutional: Negative for fever, appetite change, fatigue and unexpected weight change.    Eyes: Negative for pain and visual disturbance.  Respiratory: Negative for cough and shortness of breath.   Cardiovascular: Negative for cp or palpitations    Gastrointestinal: Negative for nausea, diarrhea and constipation.  Genitourinary: Negative for urgency and frequency.  Skin: Negative for pallor or rash   MSk pos for R hip/outer leg pain /neg for back pain  Neurological: Negative for weakness, light-headedness, numbness and headaches.  Hematological: Negative for adenopathy. Does not bruise/bleed easily.  Psychiatric/Behavioral: Negative for dysphoric mood. The patient is not nervous/anxious. Pos for occas trouble getting to sleep         Objective:   Physical Exam  Constitutional: He appears well-developed and well-nourished. No distress.  HENT:  Head: Normocephalic and atraumatic.  Right Ear: External ear normal.  Left Ear: External ear normal.  Scant cerumen  Eyes: Conjunctivae and EOM are normal. Pupils are equal, round, and reactive to light. Right eye exhibits no discharge. Left eye exhibits no discharge. No scleral icterus.  Neck: Normal range of motion. Neck supple. No JVD present. Carotid bruit is not present. No thyromegaly present.  Cardiovascular: Normal rate, regular rhythm, normal heart sounds and intact distal pulses.  Exam reveals no gallop.   Pulmonary/Chest: Effort normal and breath sounds normal. No respiratory distress. He has no wheezes. He exhibits no tenderness.  Abdominal: Soft. Bowel sounds are normal. He exhibits no distension, no abdominal bruit and no mass. There is no tenderness.  Musculoskeletal: He exhibits tenderness. He exhibits no edema.  Slightly tender over R greater trochanter  Nl rom of leg and hip  Neg SLR No spinal tenderness No leg swelling   Lymphadenopathy:    He has no cervical adenopathy.  Neurological: He is alert. He has normal reflexes. No cranial nerve deficit. He exhibits normal muscle tone. Coordination normal.  Skin: Skin  is warm and dry. No rash noted. No erythema. No pallor.  Psychiatric: He has a normal mood and affect.          Assessment & Plan:

## 2013-12-15 NOTE — Progress Notes (Signed)
Pre visit review using our clinic review tool, if applicable. No additional management support is needed unless otherwise documented below in the visit note. 

## 2013-12-15 NOTE — Assessment & Plan Note (Signed)
Suspect trochanteric bursitis  Pt is athletic with good flexibility Disc stretching and cold compresses  Would consider an appt with sport med Dr Lorelei Pont in the future if symptoms worsen

## 2013-12-15 NOTE — Assessment & Plan Note (Signed)
bp has been creeping up lately  Overall good lifestyle habits  Will add back lisinopril 20 mg daily  F/u in the fall and in the meantime watch in oncol office and at home  Rev last chem labs Will check bmp on May 11 -lab draw -added to oncol labs

## 2013-12-16 ENCOUNTER — Telehealth: Payer: Self-pay | Admitting: Family Medicine

## 2013-12-16 NOTE — Telephone Encounter (Signed)
Relevant patient education assigned to patient using Emmi. ° °

## 2013-12-25 ENCOUNTER — Other Ambulatory Visit: Payer: Self-pay

## 2013-12-25 ENCOUNTER — Ambulatory Visit: Payer: Self-pay | Admitting: Hematology and Oncology

## 2013-12-29 ENCOUNTER — Encounter: Payer: Self-pay | Admitting: Hematology and Oncology

## 2013-12-29 ENCOUNTER — Ambulatory Visit (HOSPITAL_BASED_OUTPATIENT_CLINIC_OR_DEPARTMENT_OTHER): Payer: Commercial Managed Care - HMO | Admitting: Hematology and Oncology

## 2013-12-29 ENCOUNTER — Other Ambulatory Visit (HOSPITAL_BASED_OUTPATIENT_CLINIC_OR_DEPARTMENT_OTHER): Payer: Commercial Managed Care - HMO

## 2013-12-29 VITALS — BP 141/64 | HR 60 | Temp 97.5°F | Resp 18 | Ht 75.0 in | Wt 212.4 lb

## 2013-12-29 DIAGNOSIS — Z8589 Personal history of malignant neoplasm of other organs and systems: Secondary | ICD-10-CM

## 2013-12-29 DIAGNOSIS — M898X9 Other specified disorders of bone, unspecified site: Secondary | ICD-10-CM

## 2013-12-29 DIAGNOSIS — M949 Disorder of cartilage, unspecified: Secondary | ICD-10-CM

## 2013-12-29 DIAGNOSIS — M899 Disorder of bone, unspecified: Secondary | ICD-10-CM

## 2013-12-29 DIAGNOSIS — C099 Malignant neoplasm of tonsil, unspecified: Secondary | ICD-10-CM

## 2013-12-29 DIAGNOSIS — C801 Malignant (primary) neoplasm, unspecified: Secondary | ICD-10-CM

## 2013-12-29 LAB — COMPREHENSIVE METABOLIC PANEL (CC13)
ALT: 21 U/L (ref 0–55)
ANION GAP: 13 meq/L — AB (ref 3–11)
AST: 18 U/L (ref 5–34)
Albumin: 4 g/dL (ref 3.5–5.0)
Alkaline Phosphatase: 74 U/L (ref 40–150)
BILIRUBIN TOTAL: 0.3 mg/dL (ref 0.20–1.20)
BUN: 17.2 mg/dL (ref 7.0–26.0)
CO2: 23 mEq/L (ref 22–29)
CREATININE: 0.9 mg/dL (ref 0.7–1.3)
Calcium: 9.9 mg/dL (ref 8.4–10.4)
Chloride: 105 mEq/L (ref 98–109)
Glucose: 125 mg/dl (ref 70–140)
Potassium: 4.2 mEq/L (ref 3.5–5.1)
Sodium: 141 mEq/L (ref 136–145)
Total Protein: 7.1 g/dL (ref 6.4–8.3)

## 2013-12-29 LAB — CBC WITH DIFFERENTIAL/PLATELET
BASO%: 0.1 % (ref 0.0–2.0)
BASOS ABS: 0 10*3/uL (ref 0.0–0.1)
EOS%: 1.3 % (ref 0.0–7.0)
Eosinophils Absolute: 0.1 10*3/uL (ref 0.0–0.5)
HEMATOCRIT: 49.2 % (ref 38.4–49.9)
HEMOGLOBIN: 16.7 g/dL (ref 13.0–17.1)
LYMPH#: 0.4 10*3/uL — AB (ref 0.9–3.3)
LYMPH%: 6.2 % — ABNORMAL LOW (ref 14.0–49.0)
MCH: 33 pg (ref 27.2–33.4)
MCHC: 33.9 g/dL (ref 32.0–36.0)
MCV: 97.1 fL (ref 79.3–98.0)
MONO#: 0.5 10*3/uL (ref 0.1–0.9)
MONO%: 6.5 % (ref 0.0–14.0)
NEUT#: 6 10*3/uL (ref 1.5–6.5)
NEUT%: 85.9 % — AB (ref 39.0–75.0)
Platelets: 173 10*3/uL (ref 140–400)
RBC: 5.06 10*6/uL (ref 4.20–5.82)
RDW: 12 % (ref 11.0–14.6)
WBC: 7 10*3/uL (ref 4.0–10.3)

## 2013-12-29 LAB — TSH CHCC: TSH: 3.188 m(IU)/L (ref 0.320–4.118)

## 2013-12-29 LAB — T4, FREE: Free T4: 0.89 ng/dL (ref 0.80–1.80)

## 2013-12-29 NOTE — Progress Notes (Signed)
[facility] progress notes  Patient Care Team: Abner Greenspan, MD as PCP - General (Family Medicine) Rozetta Nunnery, MD Rexene Edison, MD (Radiation Oncology) Heath Lark, MD as Consulting Physician (Hematology and Oncology)  CHIEF COMPLAINTS/PURPOSE OF VISIT:  Right tonsil cancer, no evidence of disease  HISTORY OF PRESENTING ILLNESS:  Phillip [name] 73 y.o. male was transferred to my care after his prior physician has left.  I reviewed the patient's records extensive and collaborated the history with the patient. Summary of his history is as follows: This patient was discovered to tonsil cancer after presentation with right neck swelling. Imaging study and biopsy confirmed HPV positive squamous cell carcinoma of the right tonsil, cT1 N2 M0. He was treated with every three week cisplatin and daily radiation therapy was started 01/31/2010. He is status post two cycles of cisplatin. He did not receive his third cycle due to progressive hearing loss and tinnitus. His radiation was initiated on 01/31/2010 and concluded on 03/22/2010.  After his treatment was completed, he had lost his ability to taste. He is dependent on 8 cans of Ensure daily. He denies dysphagia. Denies any dental issues. He is able to maintain his weight. Denies any new palpable lymphadenopathy.  MEDICAL HISTORY:  Past Medical History  Diagnosis Date  . Diarrhea   . Sinus bradycardia   . Neck pain   . Coronary artery disease   . Unspecified essential hypertension   . History of benign prostatic hypertrophy   . Squamous cell carcinoma     right tonsil  . Thrombocytopenia 02/02/2012  . History of radiation therapy 01/31/10-03/22/10    right tonsil  . HTN (hypertension)   . Anxiety   . Hyperlipidemia     diet controlled  . Thrombocytopenia     unclear etiology  . 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 Tinnitus   . Hearing loss   . Hypothyroid 01/16/2013    SURGICAL HISTORY: Past Surgical  History  Procedure Laterality Date  . Tonsillectomy    . Vasectomy    . Knee arthroscopy      left knee  . Dental surgery      extractions  . Peg placement      July 2011    SOCIAL HISTORY: History   Social History  . Marital Status: Married    Spouse Name: N/A    Number of Children: N/A  . Years of Education: N/A   Occupational History  . Not on file.   Social History Main Topics  . Smoking status: Never Smoker   . Smokeless tobacco: Never Used  . Alcohol Use: No  . Drug Use: No  . Sexual Activity: Not on file   Other Topics Concern  . 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   [place], remains very active with an interest in politics. Is thinking of moving to the greater [facility] area. Jan 11' moved to a [place] outside of [place].     FAMILY HISTORY: Family History  Problem Relation Age of Onset  . Coronary artery disease Father   . Heart attack Father   . Heart disease Father   . Colon cancer Neg Hx   . Esophageal cancer Neg Hx   . Rectal cancer Neg  Hx   . Stomach cancer Neg Hx     ALLERGIES:  is allergic to morphine and related.  MEDICATIONS:  Current Outpatient Prescriptions  Medication Sig Dispense Refill  . ADACEL 12-20-13.5 LF-MCG/0.5 injection       . aspirin 81 MG tablet Take 81 mg by mouth daily.        Marland Kitchen docusate sodium (STOOL SOFTENER) 250 MG capsule Take 250 mg by mouth daily.      . fluticasone (FLONASE) 50 MCG/ACT nasal spray       . FLUZONE HIGH-DOSE injection       . ibuprofen (ADVIL,MOTRIN) 200 MG tablet Take 200 mg by mouth as needed.        Marland Kitchen lisinopril (PRINIVIL,ZESTRIL) 20 MG tablet Take 1 tablet (20 mg total) by mouth daily.  30 tablet  5  . Magnesium 200 MG TABS Take by mouth daily.      . Melatonin 3 MG CAPS Take 1  capsule by mouth daily.       . Multiple Vitamin (MULTIVITAMIN) tablet Take 1 tablet by mouth daily.      Marland Kitchen Resveratrol 250 MG CAPS Take 250 mg by mouth daily.      . vitamin C (ASCORBIC ACID) 500 MG tablet Take 500 mg by mouth daily.      . vitamin E 400 UNIT capsule Take 400 Units by mouth daily.      . zaleplon (SONATA) 10 MG capsule TAKE ONE CAPSULE BY MOUTH AS NEEDED       No current facility-administered medications for this visit.    REVIEW OF SYSTEMS:   Constitutional: Denies fevers, chills or abnormal night sweats Eyes: Denies blurriness of vision, double vision or watery eyes Ears, nose, mouth, throat, and face: Denies mucositis or sore throat Respiratory: Denies cough, dyspnea or wheezes Cardiovascular: Denies palpitation, chest discomfort or lower extremity swelling Gastrointestinal:  Denies nausea, heartburn or change in bowel habits Skin: Denies abnormal skin rashes Lymphatics: Denies new lymphadenopathy or easy bruising Neurological:Denies numbness, tingling or new weaknesses Behavioral/Psych: Mood is stable, no new changes  All other systems were reviewed with the patient and are negative.  PHYSICAL EXAMINATION: ECOG PERFORMANCE STATUS: 0 - Asymptomatic  Filed Vitals:   12/29/13 1429  BP: 141/64  Pulse: 60  Temp: 97.5 F (36.4 C)  Resp: 18   Filed Weights   12/29/13 1429  Weight: 212 lb 6.4 oz (96.344 kg)    GENERAL:alert, no distress and comfortable SKIN: skin color, texture, turgor are normal, no rashes or significant lesions EYES: normal, conjunctiva are pink and non-injected, sclera clear OROPHARYNX:no exudate, normal lips, buccal mucosa, and tongue  NECK: Neck is thick from radiation-induced skin fibrosis. thyroid normal size, non-tender, without nodularity LYMPH:  no palpable lymphadenopathy in the cervical, axillary or inguinal LUNGS: clear to auscultation and percussion with normal breathing effort HEART: regular rate & rhythm and no murmurs  without lower extremity edema ABDOMEN:abdomen soft, non-tender and normal bowel sounds Musculoskeletal:no cyanosis of digits and no clubbing  PSYCH: alert & oriented x 3 with fluent speech NEURO: no focal motor/sensory deficits  LABORATORY DATA:  I have reviewed the data as listed Lab Results  Component Value Date   WBC 7.0 12/29/2013   HGB 16.7 12/29/2013   HCT 49.2 12/29/2013   MCV 97.1 12/29/2013   PLT 173 12/29/2013    Recent Labs  01/16/13 0938 07/29/13 0959 12/29/13 1351  NA 142 143 141  K 4.7 3.8 4.2  CL 106  --   --  CO2 29 24 23   GLUCOSE 108* 115 125  BUN 18.8 15.0 17.2  CREATININE 0.9 0.8 0.9  CALCIUM 9.6 9.8 9.9  PROT 6.7 7.3 7.1  ALBUMIN 3.8 4.1 4.0  AST 23 22 18   ALT 27 21 21   ALKPHOS 75 78 74  BILITOT 0.78 0.63 0.30    ASSESSMENT & PLAN:  #1 tonsil cancer He has no evidence of disease recurrence. I will discharge him from oncology clinic. I recommend he continue close followup with his ENT surgeon with history and physical examination. He will need his thyroid function tests to be monitored yearly. #2 history of thrombocytopenia, resolved This has resolved #3 bone pain Bone scan was negative for cancer. I recommend vitamin D supplements.   All questions were answered. The patient knows to call the clinic with any problems, questions or concerns. I spent 15 minutes counseling the patient face to face. The total time spent in the appointment was 20 minutes and more than 50% was on counseling.     Heath Lark, MD 12/29/2013 2:55 PM

## 2013-12-30 ENCOUNTER — Encounter: Payer: Self-pay | Admitting: Family Medicine

## 2014-01-05 ENCOUNTER — Telehealth: Payer: Self-pay | Admitting: Family Medicine

## 2014-01-05 DIAGNOSIS — C801 Malignant (primary) neoplasm, unspecified: Secondary | ICD-10-CM

## 2014-01-05 DIAGNOSIS — R221 Localized swelling, mass and lump, neck: Secondary | ICD-10-CM

## 2014-01-05 NOTE — Telephone Encounter (Signed)
Urgent ENT ref-sent to [facility]

## 2014-01-07 NOTE — Telephone Encounter (Signed)
Appt made with Dr Lucia Gaskins pt aware.

## 2014-02-06 ENCOUNTER — Telehealth: Payer: Self-pay | Admitting: Family Medicine

## 2014-02-06 NOTE — Telephone Encounter (Signed)
Pt dropped off hiking permission form Please fax to number on form   Gave to shapale

## 2014-02-09 NOTE — Telephone Encounter (Signed)
Form placed in your inbox  I called pt and advise him Dr. Glori Bickers not in office until 02/17/14 and that we will call him next week when form is completed, pt verbalized understanding

## 2014-04-15 ENCOUNTER — Telehealth: Payer: Self-pay | Admitting: Family Medicine

## 2014-04-15 DIAGNOSIS — C801 Malignant (primary) neoplasm, unspecified: Secondary | ICD-10-CM

## 2014-04-15 NOTE — Telephone Encounter (Signed)
Done

## 2014-04-15 NOTE — Telephone Encounter (Signed)
Pt needs a referral to his Cancer Dr, Dr Valere Dross, with [facility]. This is a follow-up visit for the pt and his appt is 04/28/2014. Pt has [employer]. Thank you!

## 2014-04-17 ENCOUNTER — Encounter: Payer: Self-pay | Admitting: *Deleted

## 2014-04-28 ENCOUNTER — Ambulatory Visit
Admission: RE | Admit: 2014-04-28 | Discharge: 2014-04-28 | Disposition: A | Discharge status: Home / Self Care | Payer: Medicare Other | Source: Ambulatory Visit | Attending: Radiation Oncology | Admitting: Radiation Oncology

## 2014-04-28 ENCOUNTER — Encounter: Payer: Self-pay | Admitting: Radiation Oncology

## 2014-04-28 VITALS — BP 134/66 | HR 55 | Temp 98.0°F | Resp 20 | Wt 213.3 lb

## 2014-04-28 DIAGNOSIS — C801 Malignant (primary) neoplasm, unspecified: Secondary | ICD-10-CM

## 2014-04-28 NOTE — Progress Notes (Signed)
Patient denies pain, difficultly eating, swallowing, fatigue, loss of appetite. He saw Dr Lucia Gaskins in may 2015, no evidence of recurrent cancer per Dr Pollie Friar note. Pt completed course of keflex for swollen glad of right neck. Pt states he has had no further swelling of this area.

## 2014-04-28 NOTE — Progress Notes (Signed)
CC:. Dr. Radene Journey  Followup note:  Phillip [name] returns today approximately 4 years and 2 months following completion of chemoradiation in the management of hisT1N1 squamous cell carcinoma the right tonsil. He is without new complaints today. His taste remains altered and he does have mild xerostomia. He does eat a variety of foods by mouth, but he obtains most of his calories through and sure/diet supplements. He saw Dr. Radene Journey this past May and was given a good report. He did have an enlarged right submandibular gland which responded to antibiotics. He maintains close dental followup. He is an avid golfer, and recently "shot his age".  Physical examination: Alert and oriented.  Wt Readings from Last 3 Encounters:  04/28/14 213 lb 4.8 oz (96.752 kg)  12/29/13 212 lb 6.4 oz (96.344 kg)  12/15/13 212 lb 8 oz (96.389 kg)   Temp Readings from Last 3 Encounters:  04/28/14 98 F (36.7 C) Oral  12/29/13 97.5 F (36.4 C) Oral  12/15/13 98.4 F (36.9 C) Oral   BP Readings from Last 3 Encounters:  04/28/14 134/66  12/29/13 141/64  12/15/13 150/90   Pulse Readings from Last 3 Encounters:  04/28/14 55  12/29/13 60  12/15/13 70   Nodes: There is no palpable lymphadenopathy in the neck. Oral cavity and oropharynx are unremarkable to inspection. No visible or palpable evidence for recurrent tonsillar carcinoma. Indirect mirror examination confirmatory. Cranial nerves 2-12 intact.  Laboratory data: Lab Results  Component Value Date   TSH 3.188 12/29/2013     Impression: No evidence for recurrent disease.  Plan: He'll maintain his followup with Dr. Lucia Gaskins and see me back for a followup visit in 10 months at which time he will be 5 years out from completion of chemoradiation. He will need to be monitored for hypothyroidism.

## 2014-05-19 ENCOUNTER — Ambulatory Visit: Payer: Self-pay | Admitting: Family Medicine

## 2014-05-25 ENCOUNTER — Ambulatory Visit (INDEPENDENT_AMBULATORY_CARE_PROVIDER_SITE_OTHER): Payer: Commercial Managed Care - HMO | Admitting: Family Medicine

## 2014-05-25 ENCOUNTER — Encounter: Payer: Self-pay | Admitting: Family Medicine

## 2014-05-25 VITALS — BP 128/78 | HR 79 | Temp 98.8°F | Ht 75.0 in | Wt 212.2 lb

## 2014-05-25 DIAGNOSIS — Z23 Encounter for immunization: Secondary | ICD-10-CM

## 2014-05-25 DIAGNOSIS — R351 Nocturia: Secondary | ICD-10-CM

## 2014-05-25 DIAGNOSIS — M79604 Pain in right leg: Secondary | ICD-10-CM

## 2014-05-25 DIAGNOSIS — N401 Enlarged prostate with lower urinary tract symptoms: Secondary | ICD-10-CM

## 2014-05-25 DIAGNOSIS — I10 Essential (primary) hypertension: Secondary | ICD-10-CM

## 2014-05-25 NOTE — Assessment & Plan Note (Signed)
Recommend he continue tamsulosin if helpful and f/u with Dr Risa Grill as needed  He knows to watch fluids before bedtime  He also has sonata if needed to help him go back to sleep

## 2014-05-25 NOTE — Assessment & Plan Note (Signed)
bp in fair control at this time  BP Readings from Last 1 Encounters:  05/25/14 128/78   No changes needed Disc lifstyle change with low sodium diet and exercise  Last labs reviewed

## 2014-05-25 NOTE — Assessment & Plan Note (Signed)
Recommend f/u with sport med/Dr Copland  Pt is unsure if this begins in the buttock or trochanter area  No neuro symptoms

## 2014-05-25 NOTE — Progress Notes (Signed)
Pre visit review using our clinic review tool, if applicable. No additional management support is needed unless otherwise documented below in the visit note. 

## 2014-05-25 NOTE — Patient Instructions (Addendum)
Flu vaccine today  prevnar vaccine today  Make an appt with Dr Lorelei Pont on the way out  Also work on transfer to [employer]  Go ahead and return to the medication from Dr Risa Grill for night time frequency  Also take sonata when you need it

## 2014-05-25 NOTE — Progress Notes (Signed)
Subjective:    Patient ID: Phillip [name], male    DOB: 08-14-1941, 73 y.o.   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- still walks and jogs -no problems moving  He actually loosens up with activity  occ takes ibuprofen   Has seen Dr Risa Grill for frequent urination at night  He took tamsulosin 0.4 mg - helped a bit - and he may decide to refill it  Prior to that - he was on avodart for years  Had disc doing a bladder emptying study  It was covered so he elected not to do it   Was given sonata for sleep disorder - took it occasionally  Now he gets up to urinate and has trouble getting back to sleep   bp is stable today  No cp or palpitations or headaches or edema  No side effects to medicines  BP Readings from Last 3 Encounters:  05/25/14 128/78  04/28/14 134/66  12/29/13 141/64   at home    Appointment on 12/29/2013  Component Date Value Ref Range Status  . Sodium 12/29/2013 141  136 - 145 mEq/L Final  . Potassium 12/29/2013 4.2  3.5 - 5.1 mEq/L Final  . Chloride 12/29/2013 105  98 - 109 mEq/L Final  . CO2 12/29/2013 23  22 - 29 mEq/L Final  . Glucose 12/29/2013 125  70 - 140 mg/dl Final  . BUN 12/29/2013 17.2  7.0 - 26.0 mg/dL Final  . Creatinine 12/29/2013 0.9  0.7 - 1.3 mg/dL Final  . Total Bilirubin 12/29/2013 0.30  0.20 - 1.20 mg/dL Final  . Alkaline Phosphatase 12/29/2013 74  40 - 150 U/L Final  . AST 12/29/2013 18  5 - 34 U/L Final  . ALT 12/29/2013 21  0 - 55 U/L Final  . Total Protein 12/29/2013 7.1  6.4 - 8.3 g/dL Final  . Albumin 12/29/2013 4.0  3.5 - 5.0 g/dL Final  . Calcium 12/29/2013 9.9  8.4 - 10.4 mg/dL Final  . Anion Gap 12/29/2013 13* 3 - 11 mEq/L Final  . WBC 12/29/2013 7.0  4.0 - 10.3 10e3/uL  Final  . NEUT# 12/29/2013 6.0  1.5 - 6.5 10e3/uL Final  . HGB 12/29/2013 16.7  13.0 - 17.1 g/dL Final  . HCT 12/29/2013 49.2  38.4 - 49.9 % Final  . Platelets 12/29/2013 173  140 - 400 10e3/uL Final  . MCV 12/29/2013 97.1  79.3 - 98.0 fL Final  . MCH 12/29/2013 33.0  27.2 - 33.4 pg Final  . MCHC 12/29/2013 33.9  32.0 - 36.0 g/dL Final  . RBC 12/29/2013 5.06  4.20 - 5.82 10e6/uL Final  . RDW 12/29/2013 12.0  11.0 - 14.6 % Final  . lymph# 12/29/2013 0.4* 0.9 - 3.3 10e3/uL Final  . MONO# 12/29/2013 0.5  0.1 - 0.9 10e3/uL Final  . Eosinophils Absolute 12/29/2013 0.1  0.0 - 0.5 10e3/uL Final  . Basophils Absolute 12/29/2013 0.0  0.0 - 0.1 10e3/uL Final  . NEUT% 12/29/2013 85.9* 39.0 - 75.0 % Final  . LYMPH% 12/29/2013 6.2* 14.0 - 49.0 % Final  . MONO% 12/29/2013 6.5  0.0 - 14.0 %  Final  . EOS% 12/29/2013 1.3  0.0 - 7.0 % Final  . BASO% 12/29/2013 0.1  0.0 - 2.0 % Final  . Free T4 12/29/2013 0.89  0.80 - 1.80 ng/dL Final  . TSH 12/29/2013 3.188  0.320 - 4.118 m(IU)/L Final    Wt is down 1 lb with bmi of 26  Lab Results  Component Value Date   CHOL 166 09/05/2011   HDL 30.70* 09/05/2011   LDLCALC 96 03/16/2011   LDLDIRECT 103.4 09/05/2011   TRIG 264.0* 09/05/2011   CHOLHDL 5 09/05/2011    Mentions he is moving to [place] - to be closer to his wife's kids  Just closed on a lot there  May end up moving to [place] location   Patient Active Problem List   Diagnosis Date Noted  . Neck swelling 01/05/2014  . Right hip pain 12/15/2013  . Nail fungal infection 12/15/2013  . Right leg pain 11/05/2013  . Impotence, organic 11/04/2013  . Bilateral carotid artery disease 11/04/2013  . Hypothyroid 01/16/2013  . Thrombocytopenia 02/02/2012  . Health care maintenance 09/21/2011  . Decreased libido 06/23/2010  . CARCINOMA, SQUAMOUS CELL 03/28/2010  . DIARRHEA 10/29/2009  . SINUS BRADYCARDIA 09/04/2008  . Essential hypertension 06/24/2007  . CORONARY ARTERY DISEASE 06/24/2007  . NECK PAIN  06/24/2007  . BPH associated with nocturia 06/24/2007   Past Medical History  Diagnosis Date  . Diarrhea   . Sinus bradycardia   . Neck pain   . Coronary artery disease   . Unspecified essential hypertension   . History of benign prostatic hypertrophy   . Squamous cell carcinoma     right tonsil  . Thrombocytopenia 02/02/2012  . History of radiation therapy 01/31/10-03/22/10    right tonsil/right neck node 7000 cGy 35 sessions, high risk lymph node volume 5940 cGy 35 sessions, low risk lymph node vol 5600 cGy 35 sessions  . HTN (hypertension)   . Anxiety   . Hyperlipidemia     diet controlled  . Thrombocytopenia     unclear etiology  . 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 Tinnitus   . Hearing loss   . Hypothyroid 01/16/2013   Past Surgical History  Procedure Laterality Date  . Tonsillectomy    . Vasectomy    . Knee arthroscopy      left knee  . Dental surgery      extractions  . Peg placement      July 2011   History  Substance Use Topics  . Smoking status: Never Smoker   . Smokeless tobacco: Never Used  . Alcohol Use: No   Family History  Problem Relation Age of Onset  . Coronary artery disease Father   . Heart attack Father   . Heart disease Father   . Colon cancer Neg Hx   . Esophageal cancer Neg Hx   . Rectal cancer Neg Hx   . Stomach cancer Neg Hx    Allergies  Allergen Reactions  . Morphine And Related Swelling   Current Outpatient Prescriptions on File Prior to Visit  Medication Sig Dispense Refill  . aspirin 81 MG tablet Take 81 mg by mouth daily.        Marland Kitchen docusate sodium (STOOL SOFTENER) 250 MG capsule Take 250 mg by mouth daily.      . fluticasone (FLONASE) 50 MCG/ACT nasal spray       . ibuprofen (ADVIL,MOTRIN) 200 MG tablet Take 200 mg by mouth as needed.        Marland Kitchen  lisinopril (PRINIVIL,ZESTRIL) 20 MG tablet Take 1 tablet (20 mg total) by mouth daily.  30 tablet  5  . Magnesium 200 MG TABS Take by mouth daily.      . Melatonin 3 MG CAPS Take 1 capsule by  mouth daily.       . Multiple Vitamin (MULTIVITAMIN) tablet Take 1 tablet by mouth daily.      Marland Kitchen Resveratrol 250 MG CAPS Take 250 mg by mouth daily.      . SF 5000 PLUS 1.1 % CREA dental cream       . vitamin C (ASCORBIC ACID) 500 MG tablet Take 500 mg by mouth daily.      . vitamin E 400 UNIT capsule Take 400 Units by mouth daily.      . zaleplon (SONATA) 10 MG capsule TAKE ONE CAPSULE BY MOUTH AS NEEDED       No current facility-administered medications on file prior to visit.    Review of Systems Review of Systems  Constitutional: Negative for fever, appetite change, fatigue and unexpected weight change.  Eyes: Negative for pain and visual disturbance.  Respiratory: Negative for cough and shortness of breath.   Cardiovascular: Negative for cp or palpitations    Gastrointestinal: Negative for nausea, diarrhea and constipation.  Genitourinary: Negative for urgency and pos for nocturia , neg for dysuria   Skin: Negative for pallor or rash   MSK pos for buttock/ hip and leg pain -ongoing  Neurological: Negative for weakness, light-headedness, numbness and headaches.  Hematological: Negative for adenopathy. Does not bruise/bleed easily.  Psychiatric/Behavioral: Negative for dysphoric mood. The patient is not nervous/anxious.         Objective:   Physical Exam  Constitutional: He appears well-developed and well-nourished. No distress.  HENT:  Head: Normocephalic and atraumatic.  Mouth/Throat: Oropharynx is clear and moist.  Eyes: Conjunctivae and EOM are normal. Pupils are equal, round, and reactive to light. No scleral icterus.  Neck: Normal range of motion. Neck supple. No JVD present. Carotid bruit is not present.  Cardiovascular: Normal rate, regular rhythm, normal heart sounds and intact distal pulses.   Pulmonary/Chest: Effort normal and breath sounds normal. No respiratory distress. He has no wheezes. He has no rales.  No crackles   Musculoskeletal: He exhibits no edema.    Lymphadenopathy:    He has no cervical adenopathy.  Neurological: He is alert. He has normal reflexes. No cranial nerve deficit. He exhibits normal muscle tone. Coordination normal.  Nl gait   Skin: Skin is warm and dry. No rash noted. No erythema. No pallor.  Psychiatric: He has a normal mood and affect.  Somewhat tangential in speech/thought           Assessment & Plan:   Problem List Items Addressed This Visit     Cardiovascular and Mediastinum   Essential hypertension - Primary      bp in fair control at this time  BP Readings from Last 1 Encounters:  05/25/14 128/78   No changes needed Disc lifstyle change with low sodium diet and exercise  Last labs reviewed       Genitourinary   BPH associated with nocturia     Recommend he continue tamsulosin if helpful and f/u with Dr Risa Grill as needed  He knows to watch fluids before bedtime  He also has sonata if needed to help him go back to sleep       Other   Right leg pain     Recommend  f/u with sport med/Dr Copland  Pt is unsure if this begins in the buttock or trochanter area  No neuro symptoms      Other Visit Diagnoses   Need for prophylactic vaccination and inoculation against influenza        Relevant Orders       Flu Vaccine QUAD 36+ mos PF IM (Fluarix Quad PF) (Completed)    Need for vaccination with 13-polyvalent pneumococcal conjugate vaccine        Relevant Orders       Pneumococcal conjugate vaccine 13-valent (Completed)

## 2014-06-06 ENCOUNTER — Other Ambulatory Visit: Payer: Self-pay | Admitting: Family Medicine

## 2014-06-08 ENCOUNTER — Encounter: Payer: Self-pay | Admitting: Family Medicine

## 2014-06-08 ENCOUNTER — Ambulatory Visit (INDEPENDENT_AMBULATORY_CARE_PROVIDER_SITE_OTHER): Payer: Commercial Managed Care - HMO | Admitting: Family Medicine

## 2014-06-08 VITALS — BP 118/72 | HR 68 | Temp 98.4°F | Ht 75.0 in | Wt 210.0 lb

## 2014-06-08 DIAGNOSIS — M79604 Pain in right leg: Secondary | ICD-10-CM

## 2014-06-08 NOTE — Patient Instructions (Signed)
[facility] hip rehab booklet.  Calf raises on a step and REVERSE CALF RAISES 3 x a week Work up to: 3 sets of 30   Begin with easy walking, heel, toe and backwards * Try to pick an easy location like a hallway or a room in your house and do one of these each time that you go through this area.

## 2014-06-08 NOTE — Progress Notes (Signed)
Dr. Frederico Hamman T. Joshawa Dubin, MD, [facility] [place], [address] Phone: [phone] Fax: [phone]  06/08/2014  Patient: Phillip [name], MRN: [id number], DOB: 09-03-1940, 73 y.o.  Primary Physician:  Loura Pardon, MD  Chief Complaint: Leg Pain  Subjective:   Phillip [name] is a 73 y.o. very pleasant male patient who presents with the following:  Former Dr. Linda Hedges patient. 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 Aout a year ago, started to get some pain and nagging pain when trying to sleep. Along lateral leg and into the shin on - and down the whole length of the leg all on the RIGHT.   Gets up because he has to go to the bathroom. Will pump his legs and sometimes will use blue emu and it will hurt. Then it will come back again.   Plays golf really well. Lumbar spine.  Recent bone scan was negative - h/o scc of salivary gland  Active and able to play a lot of golf.  Past Medical History, Surgical History, Social History, Family History, Problem List, Medications, and Allergies have been reviewed and updated if relevant.  GEN: No fevers, chills. Nontoxic. Primarily MSK c/o today. MSK: Detailed in the HPI GI: tolerating PO intake without difficulty Neuro: No numbness, parasthesias, or tingling associated. Otherwise the pertinent positives of the ROS are noted above.   Objective:   BP 118/72  Pulse 68  Temp(Src) 98.4 F (36.9 C) (Oral)  Ht 6\' 3"  (1.905 m)  Wt 210 lb (95.255 kg)  BMI 26.25 kg/m2   GEN: Well-developed,well-nourished,in no acute distress; alert,appropriate and cooperative throughout examination HEENT: Normocephalic and atraumatic without obvious abnormalities. Ears, externally no deformities PULM: Breathing comfortably in no respiratory distress EXT: No clubbing, cyanosis, or edema PSYCH: Normally interactive. Cooperative during the interview. Pleasant. Friendly and conversant. Not anxious or  depressed appearing. Normal, full affect.  Range of motion at  the waist: Flexion: normal Extension: normal Lateral bending: normal Rotation: all normal  No echymosis or edema Rises to examination table with no difficulty Gait: non antalgic  Inspection/Deformity: N Paraspinus Tenderness: minimal to none  B Ankle Dorsiflexion (L5,4): 5/5 B Great Toe Dorsiflexion (L5,4): 5/5 Heel Walk (L5): WNL Toe Walk (S1): WNL Rise/Squat (L4): WNL  SENSORY B Medial Foot (L4): WNL B Dorsum (L5): WNL B Lateral (S1): WNL Light Touch: WNL Pinprick: WNL  REFLEXES Knee (L4): 2+ Ankle (S1): 2+  B SLR, seated: neg B SLR, supine: neg B FABER: neg B Reverse FABER: neg B Greater Troch: NT B Log Roll: neg B Stork: NT B Sciatic Notch: NT   TTP along ITB and lateral aspect of quad in vastus lateralis  TTP anterior shin  Radiology: BILATERAL HIP WITH PELVIS - 4+ VIEW  COMPARISON: None  FINDINGS:  Mild osseous demineralization.  Hip and SI joint spaces preserved, symmetric.  Minimal degenerative disc disease changes lower lumbar spine.  Mild nonspecific endosteal sclerosis at the proximal right femoral  diaphysis, seen on AP view but not visualized on frog-leg lateral  view.  No acute fracture, dislocation or bone destruction.  IMPRESSION:  Nonspecific sclerosis at the proximal right femoral diaphysis.  Etiology of this finding is uncertain.  If patient has persistent symptoms which could be referable to the  proximal right femur, consider radionuclide bone scan or potentially  CT imaging for further evaluation.  Electronically Signed  By: Lavonia Dana M.D.  On: 11/04/2013 17:31  NUCLEAR MEDICINE WHOLE BODY  BONE SCAN  TECHNIQUE:  Whole body anterior and posterior images were obtained approximately  3 hours after intravenous injection of radiopharmaceutical.  RADIOPHARMACEUTICALS: 26.4 mCi Technetium-99 MDP  COMPARISON: NM PET IMAGE RESTAG (PS) SKULL BASE TO THIGH dated    06/15/2010  FINDINGS:  There is no evidence bony metastatic disease. Degenerative uptake is  present around the feet and shoulders. Soft tissue uptake and  excretion is within normal limits. Calvarium appears within normal  limits.  IMPRESSION:  No bony metastatic disease.  Electronically Signed  By: Dereck Ligas M.D.  On: 11/12/2013 15:28    Assessment and Plan:   Right leg pain  >25 minutes spent in face to face time with patient, >50% spent in counselling or coordination of care  Good hip ROM and normal X-ray.  Not typical for neuro origin back pain on exam and reproducible on palpation.  Combination anterior compartment, ITB and lateral thigh  I am doubtful spinal origin. Rehab and reassess.  A rehabilitation program from the [employer] was reviewed with the patient face to face for their condition.   Follow-up: Return in about 2 months (around 08/08/2014).  Patient Instructions  [facility] hip rehab booklet.  Calf raises on a step and REVERSE CALF RAISES 3 x a week Work up to: 3 sets of 30   Begin with easy walking, heel, toe and backwards * Try to pick an easy location like a hallway or a room in your house and do one of these each time that you go through this area.     Signed,  [name]. Golden Gilreath, MD   Patient's Medications  New Prescriptions   No medications on file  Previous Medications   ASPIRIN 81 MG TABLET    Take 81 mg by mouth daily.     DOCUSATE SODIUM (STOOL SOFTENER) 250 MG CAPSULE    Take 250 mg by mouth daily.   FLAXSEED, LINSEED, (FLAX SEEDS PO)    Take by mouth daily.   FLUTICASONE (FLONASE) 50 MCG/ACT NASAL SPRAY       IBUPROFEN (ADVIL,MOTRIN) 200 MG TABLET    Take 200 mg by mouth as needed.     MAGNESIUM 200 MG TABS    Take by mouth daily.   MELATONIN 3 MG CAPS    Take 1 capsule by mouth daily.    MULTIPLE VITAMIN (MULTIVITAMIN) TABLET    Take 1 tablet by mouth daily.   RESVERATROL 250 MG CAPS    Take 250 mg  by mouth daily.   SF 5000 PLUS 1.1 % CREA DENTAL CREAM       VITAMIN C (ASCORBIC ACID) 500 MG TABLET    Take 500 mg by mouth daily.   VITAMIN E 400 UNIT CAPSULE    Take 400 Units by mouth daily.   ZALEPLON (SONATA) 10 MG CAPSULE    TAKE ONE CAPSULE BY MOUTH AS NEEDED  Modified Medications   Modified Medication Previous Medication   LISINOPRIL (PRINIVIL,ZESTRIL) 20 MG TABLET lisinopril (PRINIVIL,ZESTRIL) 20 MG tablet      TAKE ONE TABLET BY MOUTH ONCE DAILY    Take 1 tablet (20 mg total) by mouth daily.  Discontinued Medications   No medications on file

## 2014-06-08 NOTE — Progress Notes (Signed)
Pre visit review using our clinic review tool, if applicable. No additional management support is needed unless otherwise documented below in the visit note. 

## 2014-08-10 ENCOUNTER — Ambulatory Visit: Payer: Self-pay | Admitting: Family Medicine

## 2014-08-28 ENCOUNTER — Ambulatory Visit (INDEPENDENT_AMBULATORY_CARE_PROVIDER_SITE_OTHER): Payer: Medicare HMO | Admitting: Family Medicine

## 2014-08-28 ENCOUNTER — Encounter: Payer: Self-pay | Admitting: Family Medicine

## 2014-08-28 ENCOUNTER — Ambulatory Visit (HOSPITAL_BASED_OUTPATIENT_CLINIC_OR_DEPARTMENT_OTHER)
Admission: RE | Admit: 2014-08-28 | Discharge: 2014-08-28 | Disposition: A | Discharge status: Home / Self Care | Payer: Medicare HMO | Source: Ambulatory Visit | Attending: Family Medicine | Admitting: Family Medicine

## 2014-08-28 VITALS — BP 168/88 | HR 83 | Temp 98.3°F | Resp 18 | Ht 74.0 in | Wt 216.0 lb

## 2014-08-28 DIAGNOSIS — Z85818 Personal history of malignant neoplasm of other sites of lip, oral cavity, and pharynx: Secondary | ICD-10-CM

## 2014-08-28 DIAGNOSIS — M549 Dorsalgia, unspecified: Secondary | ICD-10-CM | POA: Insufficient documentation

## 2014-08-28 DIAGNOSIS — Z85819 Personal history of malignant neoplasm of unspecified site of lip, oral cavity, and pharynx: Secondary | ICD-10-CM

## 2014-08-28 DIAGNOSIS — M5441 Lumbago with sciatica, right side: Secondary | ICD-10-CM

## 2014-08-28 MED ORDER — ENSURE PLUS PO LIQD: ORAL | Status: DC

## 2014-08-28 NOTE — Patient Instructions (Signed)
Sciatica Sciatica is pain, weakness, numbness, or tingling along the path of the sciatic nerve. The nerve starts in the lower back and runs down the back of each leg. The nerve controls the muscles in the lower leg and in the back of the knee, while also providing sensation to the back of the thigh, lower leg, and the sole of your foot. Sciatica is a symptom of another medical condition. For instance, nerve damage or certain conditions, such as a herniated disk or bone spur on the spine, pinch or put pressure on the sciatic nerve. This causes the pain, weakness, or other sensations normally associated with sciatica. Generally, sciatica only affects one side of the body. CAUSES   Herniated or slipped disc.  Degenerative disk disease.  A pain disorder involving the narrow muscle in the buttocks (piriformis syndrome).  Pelvic injury or fracture.  Pregnancy.  Tumor (rare). SYMPTOMS  Symptoms can vary from mild to very severe. The symptoms usually travel from the low back to the buttocks and down the back of the leg. Symptoms can include:  Mild tingling or dull aches in the lower back, leg, or hip.  Numbness in the back of the calf or sole of the foot.  Burning sensations in the lower back, leg, or hip.  Sharp pains in the lower back, leg, or hip.  Leg weakness.  Severe back pain inhibiting movement. These symptoms may get worse with coughing, sneezing, laughing, or prolonged sitting or standing. Also, being overweight may worsen symptoms. DIAGNOSIS  Your caregiver will perform a physical exam to look for common symptoms of sciatica. He or she may ask you to do certain movements or activities that would trigger sciatic nerve pain. Other tests may be performed to find the cause of the sciatica. These may include:  Blood tests.  X-rays.  Imaging tests, such as an MRI or CT scan. TREATMENT  Treatment is directed at the cause of the sciatic pain. Sometimes, treatment is not necessary  and the pain and discomfort goes away on its own. If treatment is needed, your caregiver may suggest:  Over-the-counter medicines to relieve pain.  Prescription medicines, such as anti-inflammatory medicine, muscle relaxants, or narcotics.  Applying heat or ice to the painful area.  Steroid injections to lessen pain, irritation, and inflammation around the nerve.  Reducing activity during periods of pain.  Exercising and stretching to strengthen your abdomen and improve flexibility of your spine. Your caregiver may suggest losing weight if the extra weight makes the back pain worse.  Physical therapy.  Surgery to eliminate what is pressing or pinching the nerve, such as a bone spur or part of a herniated disk. HOME CARE INSTRUCTIONS   Only take over-the-counter or prescription medicines for pain or discomfort as directed by your caregiver.  Apply ice to the affected area for 20 minutes, 3-4 times a day for the first 48-72 hours. Then try heat in the same way.  Exercise, stretch, or perform your usual activities if these do not aggravate your pain.  Attend physical therapy sessions as directed by your caregiver.  Keep all follow-up appointments as directed by your caregiver.  Do not wear high heels or shoes that do not provide proper support.  Check your mattress to see if it is too soft. A firm mattress may lessen your pain and discomfort. SEEK IMMEDIATE MEDICAL CARE IF:   You lose control of your bowel or bladder (incontinence).  You have increasing weakness in the lower back, pelvis, buttocks,   or legs.  You have redness or swelling of your back.  You have a burning sensation when you urinate.  You have pain that gets worse when you lie down or awakens you at night.  Your pain is worse than you have experienced in the past.  Your pain is lasting longer than 4 weeks.  You are suddenly losing weight without reason. MAKE SURE YOU:  Understand these  instructions.  Will watch your condition.  Will get help right away if you are not doing well or get worse. Document Released: 08/01/2001 Document Revised: 02/06/2012 Document Reviewed: 12/17/2011 ExitCare Patient Information 2015 [employer]. This information is not intended to replace advice given to you by your health care provider. Make sure you discuss any questions you have with your health care provider.  

## 2014-08-28 NOTE — Progress Notes (Signed)
Subjective:    Marquette Blodgett is a 74 y.o. male who presents with right hip pain. Onset of the symptoms was several weeks ago. Inciting event: none. The patient reports the hip pain is worse after period of inactivity. Aggravating symptoms include: inactivity. Patient has had no prior hip problems. Previous visits for this problem: yes, last seen 12 months ago by norins. Evaluation to date: plain films, which were normal. Treatment to date: OTC analgesics, which have been somewhat effective.  The following portions of the patient's history were reviewed and updated as appropriate:  He  has a past medical history of Diarrhea; Sinus bradycardia; Neck pain; Coronary artery disease; Unspecified essential hypertension; History of benign prostatic hypertrophy; Squamous cell carcinoma; Thrombocytopenia (02/02/2012); History of radiation therapy (01/31/10-03/22/10); HTN (hypertension); Anxiety; Hyperlipidemia; Thrombocytopenia; HPV in male; Tinnitus; Hearing loss; and Hypothyroid (01/16/2013). He  does not have any pertinent problems on file. He  has past surgical history that includes Tonsillectomy; Vasectomy; Knee arthroscopy; Dental surgery; and PEG placement. His family history includes Coronary artery disease in his father; Heart attack in his father; Heart disease in his father. There is no history of Colon cancer, Esophageal cancer, Rectal cancer, or Stomach cancer. He  reports that he has never smoked. He has never used smokeless tobacco. He reports that he does not drink alcohol or use illicit drugs. He has a current medication list which includes the following prescription(s): aspirin, docusate sodium, flaxseed (linseed), fluticasone, ibuprofen, lisinopril, magnesium, melatonin, multivitamin, resveratrol, sf 5000 plus, vitamin c, vitamin e, zaleplon, and ensure plus. Current Outpatient Prescriptions on File Prior to Visit  Medication Sig Dispense Refill  . aspirin 81 MG tablet Take 81 mg by mouth daily.       Marland Kitchen docusate sodium (STOOL SOFTENER) 250 MG capsule Take 250 mg by mouth daily.    . Flaxseed, Linseed, (FLAX SEEDS PO) Take by mouth daily.    . fluticasone (FLONASE) 50 MCG/ACT nasal spray     . ibuprofen (ADVIL,MOTRIN) 200 MG tablet Take 200 mg by mouth as needed.      Marland Kitchen lisinopril (PRINIVIL,ZESTRIL) 20 MG tablet TAKE ONE TABLET BY MOUTH ONCE DAILY 30 tablet 5  . Magnesium 200 MG TABS Take by mouth daily.    . Melatonin 3 MG CAPS Take 1 capsule by mouth daily.     . Multiple Vitamin (MULTIVITAMIN) tablet Take 1 tablet by mouth daily.    Marland Kitchen Resveratrol 250 MG CAPS Take 250 mg by mouth daily.    . SF 5000 PLUS 1.1 % CREA dental cream     . vitamin C (ASCORBIC ACID) 500 MG tablet Take 500 mg by mouth daily.    . vitamin E 400 UNIT capsule Take 400 Units by mouth daily.    . zaleplon (SONATA) 10 MG capsule TAKE ONE CAPSULE BY MOUTH AS NEEDED     No current facility-administered medications on file prior to visit.   He is allergic to morphine and related..   Review of Systems Pertinent items are noted in HPI.   Objective:    BP 168/88 mmHg  Pulse 83  Temp(Src) 98.3 F (36.8 C) (Oral)  Resp 18  Ht 6\' 2"  (1.88 m)  Wt 216 lb (97.977 kg)  BMI 27.72 kg/m2  SpO2 97% Right hip: positives: pain with movement of hip  Left hip: full painless range of motion, without tenderness   Imaging: X-ray the right hip: not available    Assessment:    R hip pain-- suspect it may  be from low back    Plan:    Natural history and expected course discussed. Questions answered. Scientist, clinical (histocompatibility and immunogenetics) distributed. OTC analgesics as needed. X-rays per orders. Follow up in 2 weeks. ===if needed  1. History of cancer tonsil  - ENSURE PLUS (ENSURE PLUS) LIQD; 1 can 8 x a day  Dispense: 240 Can; Refill: 11  2. Low back pain with right-sided sciatica, unspecified back pain laterality   - DG Lumbar Spine 2-3 Views; Future

## 2014-08-28 NOTE — Progress Notes (Signed)
Pre visit review using our clinic review tool, if applicable. No additional management support is needed unless otherwise documented below in the visit note. 

## 2014-08-31 ENCOUNTER — Telehealth: Payer: Self-pay

## 2014-08-31 DIAGNOSIS — IMO0002 Reserved for concepts with insufficient information to code with codable children: Secondary | ICD-10-CM

## 2014-08-31 DIAGNOSIS — M4854XA Collapsed vertebra, not elsewhere classified, thoracic region, initial encounter for fracture: Secondary | ICD-10-CM

## 2014-08-31 NOTE — Telephone Encounter (Signed)
Discussed with the patient and he voiced understanding. He agreed to complete the MRI's at the [facility]. Apt scheduled.         KP

## 2014-08-31 NOTE — Telephone Encounter (Signed)
-----   Message from Rosalita Chessman, DO sent at 08/28/2014  3:25 PM EST ----- Compressions in low T spine and arthritic changes---  We can get an MRI of t and L spine to see if they are old or new---- if new we can refer out for procedure to fix

## 2014-09-02 ENCOUNTER — Other Ambulatory Visit: Payer: Self-pay | Admitting: Family Medicine

## 2014-09-02 DIAGNOSIS — M5440 Lumbago with sciatica, unspecified side: Secondary | ICD-10-CM

## 2014-09-02 DIAGNOSIS — M549 Dorsalgia, unspecified: Secondary | ICD-10-CM

## 2014-09-03 ENCOUNTER — Ambulatory Visit (HOSPITAL_BASED_OUTPATIENT_CLINIC_OR_DEPARTMENT_OTHER): Payer: Medicare HMO

## 2014-09-03 ENCOUNTER — Ambulatory Visit (HOSPITAL_BASED_OUTPATIENT_CLINIC_OR_DEPARTMENT_OTHER)
Admission: RE | Admit: 2014-09-03 | Discharge: 2014-09-03 | Disposition: A | Discharge status: Home / Self Care | Payer: Medicare HMO | Source: Ambulatory Visit | Attending: Family Medicine | Admitting: Family Medicine

## 2014-09-03 DIAGNOSIS — M545 Low back pain: Secondary | ICD-10-CM | POA: Diagnosis present

## 2014-09-03 DIAGNOSIS — K5730 Diverticulosis of large intestine without perforation or abscess without bleeding: Secondary | ICD-10-CM | POA: Insufficient documentation

## 2014-09-03 DIAGNOSIS — M5440 Lumbago with sciatica, unspecified side: Secondary | ICD-10-CM

## 2014-09-03 DIAGNOSIS — M5136 Other intervertebral disc degeneration, lumbar region: Secondary | ICD-10-CM | POA: Diagnosis not present

## 2014-09-03 DIAGNOSIS — M4806 Spinal stenosis, lumbar region: Secondary | ICD-10-CM | POA: Diagnosis not present

## 2014-09-04 ENCOUNTER — Other Ambulatory Visit: Payer: Self-pay

## 2014-09-04 DIAGNOSIS — M5126 Other intervertebral disc displacement, lumbar region: Secondary | ICD-10-CM

## 2014-09-07 ENCOUNTER — Telehealth: Payer: Self-pay

## 2014-09-07 ENCOUNTER — Other Ambulatory Visit: Payer: Self-pay

## 2014-09-07 DIAGNOSIS — M4854XA Collapsed vertebra, not elsewhere classified, thoracic region, initial encounter for fracture: Secondary | ICD-10-CM

## 2014-09-07 NOTE — Telephone Encounter (Signed)
Done  KP

## 2014-09-07 NOTE — Telephone Encounter (Signed)
Phillip [name] with [facility] called to request further clinical information to approve the MRI ordered of the T-spine. I reviewed the diagnosis and symptoms with the nurse and the MRI order has been approved. Case ID #[id number] and approval confirmation is [id number] for 1 day from 09/07/14 and exp on 11/26/14.       KP

## 2014-09-07 NOTE — Telephone Encounter (Signed)
Order sent to [facility]/unable to do T Spine @ [facility]

## 2014-09-07 NOTE — Telephone Encounter (Signed)
Ok great, can you please place new order. Original one was denied.

## 2014-09-08 ENCOUNTER — Other Ambulatory Visit: Payer: Self-pay

## 2014-09-08 DIAGNOSIS — M48061 Spinal stenosis, lumbar region without neurogenic claudication: Secondary | ICD-10-CM

## 2014-09-08 DIAGNOSIS — M5126 Other intervertebral disc displacement, lumbar region: Secondary | ICD-10-CM

## 2014-09-10 ENCOUNTER — Other Ambulatory Visit: Payer: Self-pay | Admitting: *Deleted

## 2014-09-10 DIAGNOSIS — IMO0002 Reserved for concepts with insufficient information to code with codable children: Secondary | ICD-10-CM

## 2014-09-10 DIAGNOSIS — M549 Dorsalgia, unspecified: Secondary | ICD-10-CM

## 2014-09-14 ENCOUNTER — Other Ambulatory Visit: Payer: Self-pay | Admitting: *Deleted

## 2014-09-14 DIAGNOSIS — IMO0002 Reserved for concepts with insufficient information to code with codable children: Secondary | ICD-10-CM

## 2014-09-18 ENCOUNTER — Ambulatory Visit
Admission: RE | Admit: 2014-09-18 | Discharge: 2014-09-18 | Disposition: A | Discharge status: Home / Self Care | Payer: Medicare HMO | Source: Ambulatory Visit | Attending: Family Medicine | Admitting: Family Medicine

## 2014-09-18 DIAGNOSIS — IMO0002 Reserved for concepts with insufficient information to code with codable children: Secondary | ICD-10-CM

## 2014-10-06 ENCOUNTER — Other Ambulatory Visit: Payer: Self-pay

## 2014-10-13 ENCOUNTER — Ambulatory Visit (INDEPENDENT_AMBULATORY_CARE_PROVIDER_SITE_OTHER): Payer: Medicare HMO | Admitting: Family Medicine

## 2014-10-13 ENCOUNTER — Encounter: Payer: Self-pay | Admitting: Family Medicine

## 2014-10-13 VITALS — BP 138/62 | HR 65 | Temp 98.0°F | Ht 74.0 in | Wt 216.6 lb

## 2014-10-13 DIAGNOSIS — E039 Hypothyroidism, unspecified: Secondary | ICD-10-CM

## 2014-10-13 DIAGNOSIS — N529 Male erectile dysfunction, unspecified: Secondary | ICD-10-CM

## 2014-10-13 DIAGNOSIS — Z0000 Encounter for general adult medical examination without abnormal findings: Secondary | ICD-10-CM

## 2014-10-13 DIAGNOSIS — I10 Essential (primary) hypertension: Secondary | ICD-10-CM

## 2014-10-13 DIAGNOSIS — N528 Other male erectile dysfunction: Secondary | ICD-10-CM

## 2014-10-13 DIAGNOSIS — I6529 Occlusion and stenosis of unspecified carotid artery: Secondary | ICD-10-CM

## 2014-10-13 DIAGNOSIS — G4700 Insomnia, unspecified: Secondary | ICD-10-CM

## 2014-10-13 LAB — CBC WITH DIFFERENTIAL/PLATELET
BASOS PCT: 0 % (ref 0.0–3.0)
Basophils Absolute: 0 10*3/uL (ref 0.0–0.1)
EOS ABS: 0.1 10*3/uL (ref 0.0–0.7)
EOS PCT: 1.5 % (ref 0.0–5.0)
HEMATOCRIT: 50.7 % (ref 39.0–52.0)
Hemoglobin: 17.7 g/dL — ABNORMAL HIGH (ref 13.0–17.0)
LYMPHS ABS: 0.5 10*3/uL — AB (ref 0.7–4.0)
Lymphocytes Relative: 8.7 % — ABNORMAL LOW (ref 12.0–46.0)
MCHC: 34.9 g/dL (ref 30.0–36.0)
MCV: 95.5 fl (ref 78.0–100.0)
MONO ABS: 0.6 10*3/uL (ref 0.1–1.0)
Monocytes Relative: 10.9 % (ref 3.0–12.0)
NEUTROS ABS: 4.6 10*3/uL (ref 1.4–7.7)
Neutrophils Relative %: 78.9 % — ABNORMAL HIGH (ref 43.0–77.0)
Platelets: 146 10*3/uL — ABNORMAL LOW (ref 150.0–400.0)
RBC: 5.31 Mil/uL (ref 4.22–5.81)
RDW: 12.6 % (ref 11.5–15.5)
WBC: 5.8 10*3/uL (ref 4.0–10.5)

## 2014-10-13 LAB — MICROALBUMIN / CREATININE URINE RATIO
CREATININE, U: 109.5 mg/dL
MICROALB UR: 0.9 mg/dL (ref 0.0–1.9)
MICROALB/CREAT RATIO: 0.8 mg/g (ref 0.0–30.0)

## 2014-10-13 LAB — HEPATIC FUNCTION PANEL
ALBUMIN: 4.6 g/dL (ref 3.5–5.2)
ALK PHOS: 74 U/L (ref 39–117)
ALT: 23 U/L (ref 0–53)
AST: 22 U/L (ref 0–37)
BILIRUBIN TOTAL: 0.6 mg/dL (ref 0.2–1.2)
Bilirubin, Direct: 0.1 mg/dL (ref 0.0–0.3)
Total Protein: 7.1 g/dL (ref 6.0–8.3)

## 2014-10-13 LAB — BASIC METABOLIC PANEL
BUN: 18 mg/dL (ref 6–23)
CO2: 30 mEq/L (ref 19–32)
Calcium: 9.8 mg/dL (ref 8.4–10.5)
Chloride: 102 mEq/L (ref 96–112)
Creatinine, Ser: 0.86 mg/dL (ref 0.40–1.50)
GFR: 92.13 mL/min (ref >60.00)
GLUCOSE: 93 mg/dL (ref 70–99)
Potassium: 4.2 mEq/L (ref 3.5–5.1)
SODIUM: 139 meq/L (ref 135–145)

## 2014-10-13 LAB — POCT URINALYSIS DIPSTICK
Bilirubin, UA: NEGATIVE
Glucose, UA: NEGATIVE
Ketones, UA: NEGATIVE
Leukocytes, UA: NEGATIVE
Nitrite, UA: NEGATIVE
PH UA: 6
PROTEIN UA: NEGATIVE
RBC UA: NEGATIVE
SPEC GRAV UA: 1.02
UROBILINOGEN UA: NEGATIVE

## 2014-10-13 LAB — LIPID PANEL
CHOLESTEROL: 159 mg/dL (ref 0–200)
HDL: 34.7 mg/dL — ABNORMAL LOW (ref >39.00)
NONHDL: 124.3
TRIGLYCERIDES: 308 mg/dL — AB (ref 0.0–149.0)
Total CHOL/HDL Ratio: 5
VLDL: 61.6 mg/dL — AB (ref 0.0–40.0)

## 2014-10-13 LAB — TSH: TSH: 5.06 u[IU]/mL — ABNORMAL HIGH (ref 0.35–4.50)

## 2014-10-13 LAB — PSA: PSA: 0.36 ng/mL (ref 0.10–4.00)

## 2014-10-13 LAB — LDL CHOLESTEROL, DIRECT: Direct LDL: 101 mg/dL

## 2014-10-13 MED ORDER — ZALEPLON 10 MG PO CAPS: 10.0000 mg | ORAL_CAPSULE | ORAL | Status: DC | PRN

## 2014-10-13 MED ORDER — LISINOPRIL 20 MG PO TABS: 20.0000 mg | ORAL_TABLET | Freq: Every day | ORAL | Status: DC

## 2014-10-13 NOTE — Patient Instructions (Signed)
Preventive Care for Adults A healthy lifestyle and preventive care can promote health and wellness. Preventive health guidelines for men include the following key practices:  A routine yearly physical is a good way to check with your health care provider about your health and preventative screening. It is a chance to share any concerns and updates on your health and to receive a thorough exam.  Visit your dentist for a routine exam and preventative care every 6 months. Brush your teeth twice a day and floss once a day. Good oral hygiene prevents tooth decay and gum disease.  The frequency of eye exams is based on your age, health, family medical history, use of contact lenses, and other factors. Follow your health care provider's recommendations for frequency of eye exams.  Eat a healthy diet. Foods such as vegetables, fruits, whole grains, low-fat dairy products, and lean protein foods contain the nutrients you need without too many calories. Decrease your intake of foods high in solid fats, added sugars, and salt. Eat the right amount of calories for you.Get information about a proper diet from your health care provider, if necessary.  Regular physical exercise is one of the most important things you can do for your health. Most adults should get at least 150 minutes of moderate-intensity exercise (any activity that increases your heart rate and causes you to sweat) each week. In addition, most adults need muscle-strengthening exercises on 2 or more days a week.  Maintain a healthy weight. The body mass index (BMI) is a screening tool to identify possible weight problems. It provides an estimate of body fat based on height and weight. Your health care provider can find your BMI and can help you achieve or maintain a healthy weight.For adults 20 years and older:  A BMI below 18.5 is considered underweight.  A BMI of 18.5 to 24.9 is normal.  A BMI of 25 to 29.9 is considered overweight.  A BMI  of 30 and above is considered obese.  Maintain normal blood lipids and cholesterol levels by exercising and minimizing your intake of saturated fat. Eat a balanced diet with plenty of fruit and vegetables. Blood tests for lipids and cholesterol should begin at age 50 and be repeated every 5 years. If your lipid or cholesterol levels are high, you are over 50, or you are at high risk for heart disease, you may need your cholesterol levels checked more frequently.Ongoing high lipid and cholesterol levels should be treated with medicines if diet and exercise are not working.  If you smoke, find out from your health care provider how to quit. If you do not use tobacco, do not start.  Lung cancer screening is recommended for adults aged 73-80 years who are at high risk for developing lung cancer because of a history of smoking. A yearly low-dose CT scan of the lungs is recommended for people who have at least a 30-pack-year history of smoking and are a current smoker or have quit within the past 15 years. A pack year of smoking is smoking an average of 1 pack of cigarettes a day for 1 year (for example: 1 pack a day for 30 years or 2 packs a day for 15 years). Yearly screening should continue until the smoker has stopped smoking for at least 15 years. Yearly screening should be stopped for people who develop a health problem that would prevent them from having lung cancer treatment.  If you choose to drink alcohol, do not have more than  2 drinks per day. One drink is considered to be 12 ounces (355 mL) of beer, 5 ounces (148 mL) of wine, or 1.5 ounces (44 mL) of liquor.  Avoid use of street drugs. Do not share needles with anyone. Ask for help if you need support or instructions about stopping the use of drugs.  High blood pressure causes heart disease and increases the risk of stroke. Your blood pressure should be checked at least every 1-2 years. Ongoing high blood pressure should be treated with  medicines, if weight loss and exercise are not effective.  If you are 45-79 years old, ask your health care provider if you should take aspirin to prevent heart disease.  Diabetes screening involves taking a blood sample to check your fasting blood sugar level. This should be done once every 3 years, after age 45, if you are within normal weight and without risk factors for diabetes. Testing should be considered at a younger age or be carried out more frequently if you are overweight and have at least 1 risk factor for diabetes.  Colorectal cancer can be detected and often prevented. Most routine colorectal cancer screening begins at the age of 50 and continues through age 75. However, your health care provider may recommend screening at an earlier age if you have risk factors for colon cancer. On a yearly basis, your health care provider may provide home test kits to check for hidden blood in the stool. Use of a small camera at the end of a tube to directly examine the colon (sigmoidoscopy or colonoscopy) can detect the earliest forms of colorectal cancer. Talk to your health care provider about this at age 50, when routine screening begins. Direct exam of the colon should be repeated every 5-10 years through age 75, unless early forms of precancerous polyps or small growths are found.  People who are at an increased risk for hepatitis B should be screened for this virus. You are considered at high risk for hepatitis B if:  You were born in a country where hepatitis B occurs often. Talk with your health care provider about which countries are considered high risk.  Your parents were born in a high-risk country and you have not received a shot to protect against hepatitis B (hepatitis B vaccine).  You have HIV or AIDS.  You use needles to inject street drugs.  You live with, or have sex with, someone who has hepatitis B.  You are a man who has sex with other men (MSM).  You get hemodialysis  treatment.  You take certain medicines for conditions such as cancer, organ transplantation, and autoimmune conditions.  Hepatitis C blood testing is recommended for all people born from 1945 through 1965 and any individual with known risks for hepatitis C.  Practice safe sex. Use condoms and avoid high-risk sexual practices to reduce the spread of sexually transmitted infections (STIs). STIs include gonorrhea, chlamydia, syphilis, trichomonas, herpes, HPV, and human immunodeficiency virus (HIV). Herpes, HIV, and HPV are viral illnesses that have no cure. They can result in disability, cancer, and death.  If you are at risk of being infected with HIV, it is recommended that you take a prescription medicine daily to prevent HIV infection. This is called preexposure prophylaxis (PrEP). You are considered at risk if:  You are a man who has sex with other men (MSM) and have other risk factors.  You are a heterosexual man, are sexually active, and are at increased risk for HIV infection.    You take drugs by injection.  You are sexually active with a partner who has HIV.  Talk with your health care provider about whether you are at high risk of being infected with HIV. If you choose to begin PrEP, you should first be tested for HIV. You should then be tested every 3 months for as long as you are taking PrEP.  A one-time screening for abdominal aortic aneurysm (AAA) and surgical repair of large AAAs by ultrasound are recommended for men ages 32 to 67 years who are current or former smokers.  Healthy men should no longer receive prostate-specific antigen (PSA) blood tests as part of routine cancer screening. Talk with your health care provider about prostate cancer screening.  Testicular cancer screening is not recommended for adult males who have no symptoms. Screening includes self-exam, a health care provider exam, and other screening tests. Consult with your health care provider about any symptoms  you have or any concerns you have about testicular cancer.  Use sunscreen. Apply sunscreen liberally and repeatedly throughout the day. You should seek shade when your shadow is shorter than you. Protect yourself by wearing long sleeves, pants, a wide-brimmed hat, and sunglasses year round, whenever you are outdoors.  Once a month, do a whole-body skin exam, using a mirror to look at the skin on your back. Tell your health care provider about new moles, moles that have irregular borders, moles that are larger than a pencil eraser, or moles that have changed in shape or color.  Stay current with required vaccines (immunizations).  Influenza vaccine. All adults should be immunized every year.  Tetanus, diphtheria, and acellular pertussis (Td, Tdap) vaccine. An adult who has not previously received Tdap or who does not know his vaccine status should receive 1 dose of Tdap. This initial dose should be followed by tetanus and diphtheria toxoids (Td) booster doses every 10 years. Adults with an unknown or incomplete history of completing a 3-dose immunization series with Td-containing vaccines should begin or complete a primary immunization series including a Tdap dose. Adults should receive a Td booster every 10 years.  Varicella vaccine. An adult without evidence of immunity to varicella should receive 2 doses or a second dose if he has previously received 1 dose.  Human papillomavirus (HPV) vaccine. Males aged 68-21 years who have not received the vaccine previously should receive the 3-dose series. Males aged 22-26 years may be immunized. Immunization is recommended through the age of 6 years for any male who has sex with males and did not get any or all doses earlier. Immunization is recommended for any person with an immunocompromised condition through the age of 49 years if he did not get any or all doses earlier. During the 3-dose series, the second dose should be obtained 4-8 weeks after the first  dose. The third dose should be obtained 24 weeks after the first dose and 16 weeks after the second dose.  Zoster vaccine. One dose is recommended for adults aged 50 years or older unless certain conditions are present.  Measles, mumps, and rubella (MMR) vaccine. Adults born before [birth year] generally are considered immune to measles and mumps. Adults born in [birth year] or later should have 1 or more doses of MMR vaccine unless there is a contraindication to the vaccine or there is laboratory evidence of immunity to each of the three diseases. A routine second dose of MMR vaccine should be obtained at least 28 days after the first dose for students attending postsecondary  schools, health care workers, or international travelers. People who received inactivated measles vaccine or an unknown type of measles vaccine during 1963-1967 should receive 2 doses of MMR vaccine. People who received inactivated mumps vaccine or an unknown type of mumps vaccine before 1979 and are at high risk for mumps infection should consider immunization with 2 doses of MMR vaccine. Unvaccinated health care workers born before 1957 who lack laboratory evidence of measles, mumps, or rubella immunity or laboratory confirmation of disease should consider measles and mumps immunization with 2 doses of MMR vaccine or rubella immunization with 1 dose of MMR vaccine.  Pneumococcal 13-valent conjugate (PCV13) vaccine. When indicated, a person who is uncertain of his immunization history and has no record of immunization should receive the PCV13 vaccine. An adult aged 19 years or older who has certain medical conditions and has not been previously immunized should receive 1 dose of PCV13 vaccine. This PCV13 should be followed with a dose of pneumococcal polysaccharide (PPSV23) vaccine. The PPSV23 vaccine dose should be obtained at least 8 weeks after the dose of PCV13 vaccine. An adult aged 19 years or older who has certain medical conditions and  previously received 1 or more doses of PPSV23 vaccine should receive 1 dose of PCV13. The PCV13 vaccine dose should be obtained 1 or more years after the last PPSV23 vaccine dose.  Pneumococcal polysaccharide (PPSV23) vaccine. When PCV13 is also indicated, PCV13 should be obtained first. All adults aged 65 years and older should be immunized. An adult younger than age 65 years who has certain medical conditions should be immunized. Any person who resides in a nursing home or long-term care facility should be immunized. An adult smoker should be immunized. People with an immunocompromised condition and certain other conditions should receive both PCV13 and PPSV23 vaccines. People with human immunodeficiency virus (HIV) infection should be immunized as soon as possible after diagnosis. Immunization during chemotherapy or radiation therapy should be avoided. Routine use of PPSV23 vaccine is not recommended for American Indians, [place] Natives, or people younger than 65 years unless there are medical conditions that require PPSV23 vaccine. When indicated, people who have unknown immunization and have no record of immunization should receive PPSV23 vaccine. One-time revaccination 5 years after the first dose of PPSV23 is recommended for people aged 19-64 years who have chronic kidney failure, nephrotic syndrome, asplenia, or immunocompromised conditions. People who received 1-2 doses of PPSV23 before age 65 years should receive another dose of PPSV23 vaccine at age 65 years or later if at least 5 years have passed since the previous dose. Doses of PPSV23 are not needed for people immunized with PPSV23 at or after age 65 years.  Meningococcal vaccine. Adults with asplenia or persistent complement component deficiencies should receive 2 doses of quadrivalent meningococcal conjugate (MenACWY-D) vaccine. The doses should be obtained at least 2 months apart. Microbiologists working with certain meningococcal bacteria,  military recruits, people at risk during an outbreak, and people who travel to or live in countries with a high rate of meningitis should be immunized. A first-year college student up through age 21 years who is living in a residence hall should receive a dose if he did not receive a dose on or after his 16th birthday. Adults who have certain high-risk conditions should receive one or more doses of vaccine.  Hepatitis A vaccine. Adults who wish to be protected from this disease, have certain high-risk conditions, work with hepatitis A-infected animals, work in hepatitis A research labs, or   travel to or work in countries with a high rate of hepatitis A should be immunized. Adults who were previously unvaccinated and who anticipate close contact with an international adoptee during the first 60 days after arrival in the [place] from a country with a high rate of hepatitis A should be immunized.  Hepatitis B vaccine. Adults should be immunized if they wish to be protected from this disease, have certain high-risk conditions, may be exposed to blood or other infectious body fluids, are household contacts or sex partners of hepatitis B positive people, are clients or workers in certain care facilities, or travel to or work in countries with a high rate of hepatitis B.  Haemophilus influenzae type b (Hib) vaccine. A previously unvaccinated person with asplenia or sickle cell disease or having a scheduled splenectomy should receive 1 dose of Hib vaccine. Regardless of previous immunization, a recipient of a hematopoietic stem cell transplant should receive a 3-dose series 6-12 months after his successful transplant. Hib vaccine is not recommended for adults with HIV infection. Preventive Service / Frequency Ages 52 to 17  Blood pressure check.** / Every 1 to 2 years.  Lipid and cholesterol check.** / Every 5 years beginning at age 69.  Hepatitis C blood test.** / For any individual with known risks for  hepatitis C.  Skin self-exam. / Monthly.  Influenza vaccine. / Every year.  Tetanus, diphtheria, and acellular pertussis (Tdap, Td) vaccine.** / Consult your health care provider. 1 dose of Td every 10 years.  Varicella vaccine.** / Consult your health care provider.  HPV vaccine. / 3 doses over 6 months, if 72 or younger.  Measles, mumps, rubella (MMR) vaccine.** / You need at least 1 dose of MMR if you were born in 1957 or later. You may also need a second dose.  Pneumococcal 13-valent conjugate (PCV13) vaccine.** / Consult your health care provider.  Pneumococcal polysaccharide (PPSV23) vaccine.** / 1 to 2 doses if you smoke cigarettes or if you have certain conditions.  Meningococcal vaccine.** / 1 dose if you are age 35 to 60 years and a Market researcher living in a residence hall, or have one of several medical conditions. You may also need additional booster doses.  Hepatitis A vaccine.** / Consult your health care provider.  Hepatitis B vaccine.** / Consult your health care provider.  Haemophilus influenzae type b (Hib) vaccine.** / Consult your health care provider. Ages 35 to 8  Blood pressure check.** / Every 1 to 2 years.  Lipid and cholesterol check.** / Every 5 years beginning at age 57.  Lung cancer screening. / Every year if you are aged 44-80 years and have a 30-pack-year history of smoking and currently smoke or have quit within the past 15 years. Yearly screening is stopped once you have quit smoking for at least 15 years or develop a health problem that would prevent you from having lung cancer treatment.  Fecal occult blood test (FOBT) of stool. / Every year beginning at age 55 and continuing until age 73. You may not have to do this test if you get a colonoscopy every 10 years.  Flexible sigmoidoscopy** or colonoscopy.** / Every 5 years for a flexible sigmoidoscopy or every 10 years for a colonoscopy beginning at age 28 and continuing until age  1.  Hepatitis C blood test.** / For all people born from [birth year] through 1965 and any individual with known risks for hepatitis C.  Skin self-exam. / Monthly.  Influenza vaccine. / Every  year.  Tetanus, diphtheria, and acellular pertussis (Tdap/Td) vaccine.** / Consult your health care provider. 1 dose of Td every 10 years.  Varicella vaccine.** / Consult your health care provider.  Zoster vaccine.** / 1 dose for adults aged 53 years or older.  Measles, mumps, rubella (MMR) vaccine.** / You need at least 1 dose of MMR if you were born in 1957 or later. You may also need a second dose.  Pneumococcal 13-valent conjugate (PCV13) vaccine.** / Consult your health care provider.  Pneumococcal polysaccharide (PPSV23) vaccine.** / 1 to 2 doses if you smoke cigarettes or if you have certain conditions.  Meningococcal vaccine.** / Consult your health care provider.  Hepatitis A vaccine.** / Consult your health care provider.  Hepatitis B vaccine.** / Consult your health care provider.  Haemophilus influenzae type b (Hib) vaccine.** / Consult your health care provider. Ages 77 and over  Blood pressure check.** / Every 1 to 2 years.  Lipid and cholesterol check.**/ Every 5 years beginning at age 85.  Lung cancer screening. / Every year if you are aged 55-80 years and have a 30-pack-year history of smoking and currently smoke or have quit within the past 15 years. Yearly screening is stopped once you have quit smoking for at least 15 years or develop a health problem that would prevent you from having lung cancer treatment.  Fecal occult blood test (FOBT) of stool. / Every year beginning at age 33 and continuing until age 11. You may not have to do this test if you get a colonoscopy every 10 years.  Flexible sigmoidoscopy** or colonoscopy.** / Every 5 years for a flexible sigmoidoscopy or every 10 years for a colonoscopy beginning at age 28 and continuing until age 73.  Hepatitis C blood  test.** / For all people born from [birth year] through 1965 and any individual with known risks for hepatitis C.  Abdominal aortic aneurysm (AAA) screening.** / A one-time screening for ages 50 to 27 years who are current or former smokers.  Skin self-exam. / Monthly.  Influenza vaccine. / Every year.  Tetanus, diphtheria, and acellular pertussis (Tdap/Td) vaccine.** / 1 dose of Td every 10 years.  Varicella vaccine.** / Consult your health care provider.  Zoster vaccine.** / 1 dose for adults aged 34 years or older.  Pneumococcal 13-valent conjugate (PCV13) vaccine.** / Consult your health care provider.  Pneumococcal polysaccharide (PPSV23) vaccine.** / 1 dose for all adults aged 63 years and older.  Meningococcal vaccine.** / Consult your health care provider.  Hepatitis A vaccine.** / Consult your health care provider.  Hepatitis B vaccine.** / Consult your health care provider.  Haemophilus influenzae type b (Hib) vaccine.** / Consult your health care provider. **Family history and personal history of risk and conditions may change your health care provider's recommendations. Document Released: 10/03/2001 Document Revised: 08/12/2013 Document Reviewed: 01/02/2011 [facility] Patient Information 2015 [place], [place]. This information is not intended to replace advice given to you by your health care provider. Make sure you discuss any questions you have with your health care provider.

## 2014-10-13 NOTE — Progress Notes (Signed)
Pre visit review using our clinic review tool, if applicable. No additional management support is needed unless otherwise documented below in the visit note. 

## 2014-10-13 NOTE — Progress Notes (Signed)
Subjective:    Phillip [name] is a 74 y.o. male who presents for Medicare Annual/Subsequent preventive examination.   Preventive Screening-Counseling & Management  Tobacco History  Smoking status  . Never Smoker   Smokeless tobacco  . Never Used    Problems Prior to Visit 1. Nothing new  Current Problems (verified) Patient Active Problem List   Diagnosis Date Noted  . Neck swelling 01/05/2014  . Right hip pain 12/15/2013  . Nail fungal infection 12/15/2013  . Right leg pain 11/05/2013  . Impotence, organic 11/04/2013  . Bilateral carotid artery disease 11/04/2013  . Hypothyroid 01/16/2013  . Thrombocytopenia 02/02/2012  . Health care maintenance 09/21/2011  . Decreased libido 06/23/2010  . CARCINOMA, SQUAMOUS CELL 03/28/2010  . DIARRHEA 10/29/2009  . SINUS BRADYCARDIA 09/04/2008  . Essential hypertension 06/24/2007  . CORONARY ARTERY DISEASE 06/24/2007  . NECK PAIN 06/24/2007  . BPH associated with nocturia 06/24/2007    Medications Prior to Visit Current Outpatient Prescriptions on File Prior to Visit  Medication Sig Dispense Refill  . aspirin 81 MG tablet Take 81 mg by mouth daily.      Marland Kitchen docusate sodium (STOOL SOFTENER) 250 MG capsule Take 250 mg by mouth daily.    Marland Kitchen ENSURE PLUS (ENSURE PLUS) LIQD 1 can 8 x a day 240 Can 11  . Flaxseed, Linseed, (FLAX SEEDS PO) Take by mouth daily.    . fluticasone (FLONASE) 50 MCG/ACT nasal spray     . ibuprofen (ADVIL,MOTRIN) 200 MG tablet Take 200 mg by mouth as needed.      . Magnesium 200 MG TABS Take by mouth daily.    . Melatonin 3 MG CAPS Take 1 capsule by mouth daily.     . Multiple Vitamin (MULTIVITAMIN) tablet Take 1 tablet by mouth daily.    Marland Kitchen Resveratrol 250 MG CAPS Take 250 mg by mouth daily.    . SF 5000 PLUS 1.1 % CREA dental cream     . vitamin C (ASCORBIC ACID) 500 MG tablet Take 500 mg by mouth daily.    . vitamin E 400 UNIT capsule Take 400 Units by mouth daily.     No current facility-administered  medications on file prior to visit.    Current Medications (verified) Current Outpatient Prescriptions  Medication Sig Dispense Refill  . aspirin 81 MG tablet Take 81 mg by mouth daily.      Marland Kitchen docusate sodium (STOOL SOFTENER) 250 MG capsule Take 250 mg by mouth daily.    Marland Kitchen ENSURE PLUS (ENSURE PLUS) LIQD 1 can 8 x a day 240 Can 11  . Flaxseed, Linseed, (FLAX SEEDS PO) Take by mouth daily.    . fluticasone (FLONASE) 50 MCG/ACT nasal spray     . ibuprofen (ADVIL,MOTRIN) 200 MG tablet Take 200 mg by mouth as needed.      Marland Kitchen lisinopril (PRINIVIL,ZESTRIL) 20 MG tablet Take 1 tablet (20 mg total) by mouth daily. 90 tablet 1  . Magnesium 200 MG TABS Take by mouth daily.    . Melatonin 3 MG CAPS Take 1 capsule by mouth daily.     . Multiple Vitamin (MULTIVITAMIN) tablet Take 1 tablet by mouth daily.    Marland Kitchen Resveratrol 250 MG CAPS Take 250 mg by mouth daily.    . SF 5000 PLUS 1.1 % CREA dental cream     . vitamin C (ASCORBIC ACID) 500 MG tablet Take 500 mg by mouth daily.    . vitamin E 400 UNIT capsule Take 400 Units by  mouth daily.    . zaleplon (SONATA) 10 MG capsule Take 1 capsule (10 mg total) by mouth as needed for sleep. 90 capsule 1   No current facility-administered medications for this visit.     Allergies (verified) Morphine and related   PAST HISTORY  Family History Family History  Problem Relation Age of Onset  . Coronary artery disease Father   . Heart attack Father   . Heart disease Father   . Colon cancer Neg Hx   . Esophageal cancer Neg Hx   . Rectal cancer Neg Hx   . Stomach cancer Neg Hx     Social History History  Substance Use Topics  . Smoking status: Never Smoker   . Smokeless tobacco: Never Used  . Alcohol Use: No    Are there smokers in your home (other than you)?  No  Risk Factors Current exercise habits: Home exercise routine includes jogging .  Dietary issues discussed: na   Cardiac risk factors: advanced age (older than 79 for men, 20 for  women), hypertension and male gender.  Depression Screen (Note: if answer to either of the following is "Yes", a more complete depression screening is indicated)   Q1: Over the past two weeks, have you felt down, depressed or hopeless? No  Q2: Over the past two weeks, have you felt little interest or pleasure in doing things? No  Have you lost interest or pleasure in daily life? No  Do you often feel hopeless? No  Do you cry easily over simple problems? No  Activities of Daily Living In your present state of health, do you have any difficulty performing the following activities?:  Driving? No Managing money?  No Feeding yourself? No Getting from bed to chair? No Climbing a flight of stairs? No Preparing food and eating?: No Bathing or showering? No Getting dressed: No Getting to the toilet? No Using the toilet:No Moving around from place to place: No In the past year have you fallen or had a near fall?:No   Are you sexually active?  Yes  Do you have more than one partner?  No  Hearing Difficulties: No Do you often ask people to speak up or repeat themselves? No Do you experience ringing or noises in your ears? No Do you have difficulty understanding soft or whispered voices? No   Do you feel that you have a problem with memory? No  Do you often misplace items? No  Do you feel safe at home?  Yes  Cognitive Testing  Alert? Yes  Normal Appearance?Yes  Oriented to person? Yes  Place? Yes   Time? Yes  Recall of three objects?  Yes  Can perform simple calculations? Yes  Displays appropriate judgment?Yes  Can read the correct time from a watch face?Yes   Advanced Directives have been discussed with the patient? Yes   List the Names of Other Physician/Practitioners you currently use: 1.  Dentist-- [facility] 2.  opth--[place],   3.  Ortho-  [facility] 4.  Rad onc-- [facility] 5.  ENT-- [facility] Holter any recent Medical Services you may have received from other than [facility]  providers in the past year (date may be approximate).  Immunization History  Administered Date(s) Administered  . H1N1 07/21/2008  . Influenza Split 06/20/2012  . Influenza Whole 07/02/2006, 05/21/2008, 05/21/2009  . Influenza,inj,Quad PF,36+ Mos 05/25/2014  . Influenza-Unspecified 05/21/2013  . Pneumococcal Conjugate-13 11/05/2013, 05/25/2014  . Pneumococcal Polysaccharide-23 07/02/2006  . Td 09/14/2008    Screening  Tests Health Maintenance  Topic Date Due  . COLONOSCOPY  12/15/2014  . INFLUENZA VACCINE  03/22/2015  . TETANUS/TDAP  09/14/2018  . PNEUMOCOCCAL POLYSACCHARIDE VACCINE AGE 68 AND OVER  Completed  . 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 He  does not have any pertinent problems on file. He  has past surgical history that includes Tonsillectomy; Vasectomy; Knee arthroscopy; Dental surgery; and PEG placement. His family history includes Coronary artery disease in his father; Heart attack in his father; Heart disease in his father. There is no history of Colon cancer, Esophageal cancer, Rectal cancer, or Stomach cancer. He  reports that he has never smoked. He has never used smokeless tobacco. He reports that he does not drink alcohol or use illicit drugs. He has a current medication list which includes the following prescription(s): aspirin, docusate sodium, ensure plus, flaxseed (linseed), fluticasone, ibuprofen, lisinopril, magnesium, melatonin, multivitamin,  resveratrol, sf 5000 plus, vitamin c, vitamin e, and zaleplon. Current Outpatient Prescriptions on File Prior to Visit  Medication Sig Dispense Refill  . aspirin 81 MG tablet Take 81 mg by mouth daily.      Marland Kitchen docusate sodium (STOOL SOFTENER) 250 MG capsule Take 250 mg by mouth daily.    Marland Kitchen ENSURE PLUS (ENSURE PLUS) LIQD 1 can 8 x a day 240 Can 11  . Flaxseed, Linseed, (FLAX SEEDS PO) Take by mouth daily.    . fluticasone (FLONASE) 50 MCG/ACT nasal spray     . ibuprofen (ADVIL,MOTRIN) 200 MG tablet Take 200 mg by mouth as needed.      . Magnesium 200 MG TABS Take by mouth daily.    . Melatonin 3 MG CAPS Take 1 capsule by mouth daily.     . Multiple Vitamin (MULTIVITAMIN) tablet Take 1 tablet by mouth daily.    Marland Kitchen Resveratrol 250 MG CAPS Take 250 mg by mouth daily.    . SF 5000 PLUS 1.1 % CREA dental cream     . vitamin C (ASCORBIC ACID) 500 MG tablet Take 500 mg by mouth daily.    . vitamin E 400 UNIT capsule Take 400 Units by mouth daily.     No current facility-administered medications on file prior to visit.   He is allergic to morphine and related.  Review of Systems  Review of Systems  Constitutional: Negative for activity change, appetite change and fatigue.  HENT: Negative for hearing loss, congestion, tinnitus and ear discharge.   Eyes: Negative for visual disturbance (see optho q1y -- vision corrected to 20/20 with glasses).  Respiratory: Negative for cough, chest tightness and shortness of breath.   Cardiovascular: Negative for chest pain, palpitations and leg swelling.  Gastrointestinal: Negative for abdominal pain, diarrhea, constipation and abdominal distention.  Genitourinary: Negative for urgency, frequency, decreased urine volume and difficulty urinating.  Musculoskeletal: Negative for back pain, arthralgias and gait problem.  Skin: Negative for color change, pallor and rash.  Neurological: Negative for dizziness, light-headedness, numbness and headaches.   Hematological: Negative for adenopathy. Does not bruise/bleed easily.  Psychiatric/Behavioral: Negative for suicidal ideas, confusion, sleep disturbance, self-injury, dysphoric  mood, decreased concentration and agitation.  Pt is able to read and write and can do all ADLs No risk for falling No abuse/ violence in home      Objective:     Vision by Snellen chart: opth ,Blood pressure 138/62, pulse 65, temperature 98 F (36.7 C), temperature source Oral, height 6\' 2"  (1.88 m), weight 216 lb 9.6 oz (98.249 kg), SpO2 96 %. Body mass index is 27.8 kg/(m^2).  BP 138/62 mmHg  Pulse 65  Temp(Src) 98 F (36.7 C) (Oral)  Ht 6\' 2"  (1.88 m)  Wt 216 lb 9.6 oz (98.249 kg)  BMI 27.80 kg/m2  SpO2 96% General appearance: alert, cooperative, appears stated age and no distress Head: Normocephalic, without obvious abnormality, atraumatic Eyes: conjunctivae/corneas clear. PERRL, EOM's intact. Fundi benign. Ears: normal TM's and external ear canals both ears Nose: Nares normal. Septum midline. Mucosa normal. No drainage or sinus tenderness. Throat: lips, mucosa, and tongue normal; teeth and gums normal Neck: no adenopathy, no carotid bruit, no JVD, supple, symmetrical, trachea midline and thyroid not enlarged, symmetric, no tenderness/mass/nodules Back: symmetric, no curvature. ROM normal. No CVA tenderness. Lungs: clear to auscultation bilaterally Chest wall: no tenderness Heart: S1, S2 normal--- + murmur Abdomen: soft, non-tender; bowel sounds normal; no masses,  no organomegaly Male genitalia: normal, penis: no lesions or discharge. testes: no masses or tenderness. no hernias Rectal: normal tone, normal prostate, no masses or tenderness and soft brown guaiac negative stool noted Extremities: extremities normal, atraumatic, no cyanosis or edema Pulses: 2+ and symmetric Skin: Skin color, texture, turgor normal. No rashes or lesions Lymph nodes: Cervical, supraclavicular, and axillary nodes  normal. Neurologic: Alert and oriented X 3, normal strength and tone. Normal symmetric reflexes. Normal coordination and gait Psych- no depression, no anxiety      Assessment:     cpe      Plan:     During the course of the visit the patient was educated and counseled about appropriate screening and preventive services including:    Pneumococcal vaccine   Influenza vaccine  Screening electrocardiogram  Prostate cancer screening  Colorectal cancer screening  Diabetes screening  Glaucoma screening  Advanced directives: has an advanced directive - a copy HAS NOT been provided.  Diet review for nutrition referral? Yes ____  Not Indicated _x___   Patient Instructions (the written plan) was given to the patient.  Medicare Attestation I have personally reviewed: [name] medical and social history Their use of alcohol, tobacco or illicit drugs Their current medications and supplements [name] functional ability including ADLs,fall risks, home safety risks, cognitive, and hearing and visual impairment Diet and physical activities Evidence for depression or mood disorders  [name] weight, height, BMI, and visual acuity have been recorded in the chart.  I have made referrals, counseling, and provided education to the patient based on review of the above and I have provided the patient with a written personalized care plan for preventive services.    1. Essential hypertension  - lisinopril (PRINIVIL,ZESTRIL) 20 MG tablet; Take 1 tablet (20 mg total) by mouth daily.  Dispense: 90 tablet; Refill: 1 - Basic metabolic panel - CBC with Differential/Platelet - Hepatic function panel - Lipid panel - POCT urinalysis dipstick - Microalbumin / creatinine urine ratio - EKG 12-Lead  2. Insomnia  - zaleplon (SONATA) 10 MG capsule; Take 1 capsule (10 mg total) by mouth as needed for sleep.  Dispense: 90 capsule; Refill: 1  3. Hypothyroidism, unspecified  hypothyroidism type Check  labs  - TSH  4. Impotence due to erectile dysfunction  - PSA  5. Carotid stenosis, unspecified laterality   - EKG 12-Lead - US Carotid Duplex Bilateral; Future  6. Routine history and physical examination of adult    Garnet Koyanagi, DO   10/13/2014

## 2014-10-15 ENCOUNTER — Ambulatory Visit (HOSPITAL_BASED_OUTPATIENT_CLINIC_OR_DEPARTMENT_OTHER)
Admission: RE | Admit: 2014-10-15 | Discharge: 2014-10-15 | Disposition: A | Discharge status: Home / Self Care | Payer: Medicare HMO | Source: Ambulatory Visit | Attending: Family Medicine | Admitting: Family Medicine

## 2014-10-15 DIAGNOSIS — I6523 Occlusion and stenosis of bilateral carotid arteries: Secondary | ICD-10-CM | POA: Insufficient documentation

## 2014-10-15 DIAGNOSIS — I2510 Atherosclerotic heart disease of native coronary artery without angina pectoris: Secondary | ICD-10-CM | POA: Insufficient documentation

## 2014-10-15 DIAGNOSIS — I10 Essential (primary) hypertension: Secondary | ICD-10-CM | POA: Insufficient documentation

## 2014-10-15 DIAGNOSIS — E785 Hyperlipidemia, unspecified: Secondary | ICD-10-CM | POA: Diagnosis not present

## 2014-10-15 DIAGNOSIS — Z9221 Personal history of antineoplastic chemotherapy: Secondary | ICD-10-CM | POA: Insufficient documentation

## 2014-10-15 DIAGNOSIS — I6529 Occlusion and stenosis of unspecified carotid artery: Secondary | ICD-10-CM

## 2014-10-19 ENCOUNTER — Other Ambulatory Visit: Payer: Self-pay

## 2014-10-19 MED ORDER — FENOFIBRATE 160 MG PO TABS: 160.0000 mg | ORAL_TABLET | Freq: Every day | ORAL | Status: DC

## 2014-10-21 ENCOUNTER — Other Ambulatory Visit (INDEPENDENT_AMBULATORY_CARE_PROVIDER_SITE_OTHER): Payer: Medicare HMO

## 2014-10-21 DIAGNOSIS — E039 Hypothyroidism, unspecified: Secondary | ICD-10-CM

## 2014-10-21 LAB — T4, FREE: FREE T4: 0.75 ng/dL (ref 0.60–1.60)

## 2014-10-21 LAB — TSH: TSH: 4.44 u[IU]/mL (ref 0.35–4.50)

## 2014-10-21 LAB — T3, FREE: T3, Free: 3.5 pg/mL (ref 2.3–4.2)

## 2014-11-05 ENCOUNTER — Encounter: Payer: Self-pay | Admitting: Internal Medicine

## 2015-01-21 ENCOUNTER — Other Ambulatory Visit (INDEPENDENT_AMBULATORY_CARE_PROVIDER_SITE_OTHER): Payer: Medicare HMO

## 2015-01-21 DIAGNOSIS — E785 Hyperlipidemia, unspecified: Secondary | ICD-10-CM | POA: Diagnosis not present

## 2015-01-21 DIAGNOSIS — E039 Hypothyroidism, unspecified: Secondary | ICD-10-CM | POA: Diagnosis not present

## 2015-01-21 LAB — TSH: TSH: 3.97 u[IU]/mL (ref 0.35–4.50)

## 2015-01-21 LAB — LIPID PANEL
CHOLESTEROL: 144 mg/dL (ref 0–200)
HDL: 37 mg/dL — ABNORMAL LOW (ref >39.00)
LDL Cholesterol: 86 mg/dL (ref 0–99)
NonHDL: 107
TRIGLYCERIDES: 105 mg/dL (ref 0.0–149.0)
Total CHOL/HDL Ratio: 4
VLDL: 21 mg/dL (ref 0.0–40.0)

## 2015-02-01 ENCOUNTER — Encounter: Payer: Self-pay | Admitting: Internal Medicine

## 2015-02-23 ENCOUNTER — Ambulatory Visit: Payer: Commercial Managed Care - HMO | Admitting: Radiation Oncology

## 2015-03-10 ENCOUNTER — Encounter: Payer: Self-pay | Admitting: Radiation Oncology

## 2015-03-10 ENCOUNTER — Ambulatory Visit
Admission: RE | Admit: 2015-03-10 | Discharge: 2015-03-10 | Disposition: A | Discharge status: Home / Self Care | Payer: Medicare HMO | Source: Ambulatory Visit | Attending: Radiation Oncology | Admitting: Radiation Oncology

## 2015-03-10 VITALS — BP 150/79 | HR 59 | Temp 98.3°F | Resp 20 | Wt 214.9 lb

## 2015-03-10 DIAGNOSIS — C099 Malignant neoplasm of tonsil, unspecified: Secondary | ICD-10-CM

## 2015-03-10 NOTE — Progress Notes (Signed)
CC: Dr. 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  He is without new complaints today.  He still gets most of his calories with an sure, drinking up to 8 cans a day.  There are foods that he enjoys eating, but he prefers to get his calories through an sure.  He remains quite active.  He tells me he sees Dr. Lucia Gaskins every 6 months.  He has seen a periodontist for gum recession.  He still feels that his mouth is somewhat dry but this is improved.  He maintains his dental follow-up as well.  Physical examination: Alert and oriented. Filed Vitals:   03/10/15 0934  BP: 150/79  Pulse: 59  Temp: 98.3 F (36.8 C)  Resp: 20   Nodes: There is no palpable lymphadenopathy in the neck.  Oral cavity and oropharynx remarkable for mild xerostomia.  No visible or palpable evidence for recurrent disease along his right tonsil.  Indirect mirror examination confirmatory.  He does have mild trismus.   Impression: No evidence for recurrent disease.  I told the patient that I well sign off on him at this time provided that he sees Dr. Lucia Gaskins at least once a year.  He is interested in providing support for head and neck survivorship.  Plan: As above.  I wished him well.

## 2015-03-10 NOTE — Progress Notes (Addendum)
Follow up   Right tonsil,  Still drinks 8 cans ensure plus daily plus eating some soft foods, no difficulty swallowing, still has dry mouth,  Taste still not like it used to be stated, no nausea ni pain, no fatigue, plays golf 18  Holes regularly Last TSH 01/25/15=3.97 9:43 AM BP 150/79 mmHg  Pulse 59  Temp(Src) 98.3 F (36.8 C) (Oral)  Resp 20  Wt 214 lb 14.4 oz (97.478 kg)  SpO2 99%  Wt Readings from Last 3 Encounters:  03/10/15 214 lb 14.4 oz (97.478 kg)  10/13/14 216 lb 9.6 oz (98.249 kg)  09/18/14 216 lb (97.977 kg)

## 2015-03-22 HISTORY — PX: COLONOSCOPY: SHX174

## 2015-03-25 ENCOUNTER — Ambulatory Visit (AMBULATORY_SURGERY_CENTER): Payer: Self-pay | Admitting: *Deleted

## 2015-03-25 ENCOUNTER — Telehealth: Payer: Self-pay | Admitting: *Deleted

## 2015-03-25 VITALS — Ht 74.0 in | Wt 214.8 lb

## 2015-03-25 DIAGNOSIS — Z86010 Personal history of colonic polyps: Secondary | ICD-10-CM

## 2015-03-25 NOTE — Progress Notes (Signed)
Pt has hx of tonsil CA in 2011.  He had Fentanyl and Versed with his 2013 colonoscopy.  No surgeries since tonsil CA treatment.  He drinks 8 cans of Ensure daily for his nutrition- this is not because he has difficulty swallowing.  He has lost the ability to taste since his treatment.  I will send this to Dr. Hilarie Fredrickson and Lucretia Kern CRNA to make them aware just to make sure pt is ok for [facility]  No egg or soy allergy  No anesthesia or intubation problems per pt  No diet medications taken  Registered in [facility]

## 2015-03-25 NOTE — Telephone Encounter (Signed)
Ok on our end

## 2015-03-25 NOTE — Telephone Encounter (Signed)
Ok from my perspective  

## 2015-03-25 NOTE — Telephone Encounter (Signed)
Dr. Hilarie Fredrickson and Jenny Reichmann, Pt has hx of tonsil CA in 2011.  He had Fentanyl and Versed with his 2013 colonoscopy.  No surgeries since tonsil CA treatment.  He drinks 8 cans of Ensure daily for his nutrition- this is not because he has difficulty swallowing.  He has lost the ability to taste since his treatment. Given his radiation treatment, I just wanted you to be aware of this and make sure he is ok for [place].  Thanks, [employer]

## 2015-03-31 ENCOUNTER — Other Ambulatory Visit: Payer: Self-pay | Admitting: Otolaryngology

## 2015-04-08 ENCOUNTER — Encounter: Payer: Self-pay | Admitting: Internal Medicine

## 2015-04-08 ENCOUNTER — Ambulatory Visit (AMBULATORY_SURGERY_CENTER): Payer: Medicare HMO | Admitting: Internal Medicine

## 2015-04-08 VITALS — BP 148/75 | HR 50 | Temp 98.4°F | Resp 38 | Ht 74.0 in | Wt 214.0 lb

## 2015-04-08 DIAGNOSIS — Z1211 Encounter for screening for malignant neoplasm of colon: Secondary | ICD-10-CM

## 2015-04-08 DIAGNOSIS — D122 Benign neoplasm of ascending colon: Secondary | ICD-10-CM

## 2015-04-08 DIAGNOSIS — D120 Benign neoplasm of cecum: Secondary | ICD-10-CM

## 2015-04-08 DIAGNOSIS — Z86010 Personal history of colonic polyps: Secondary | ICD-10-CM

## 2015-04-08 DIAGNOSIS — D125 Benign neoplasm of sigmoid colon: Secondary | ICD-10-CM | POA: Diagnosis not present

## 2015-04-08 DIAGNOSIS — D123 Benign neoplasm of transverse colon: Secondary | ICD-10-CM

## 2015-04-08 MED ORDER — SODIUM CHLORIDE 0.9 % IV SOLN: 500.0000 mL | INTRAVENOUS | Status: DC

## 2015-04-08 NOTE — Progress Notes (Signed)
Patient was very irritable when asked about his medications.  He stated that he "took them ALL last night."  I explained that I needed to go over the list to be sure that we covered everything. When asked by another nurse if one of the meds worked..he stated that he would have to do a blind study in order to give [place] the correct answer.  He also asked why we had to ask so many questions when we have "everything we're asking right in front of [place]."  We told him that we had to ask, because we wanted to keep him safe.Marland KitchenMarland Kitchen

## 2015-04-08 NOTE — Progress Notes (Signed)
Called to room to assist during endoscopic procedure.  Patient ID and intended procedure confirmed with present staff. Received instructions for my participation in the procedure from the performing physician.  

## 2015-04-08 NOTE — Progress Notes (Deleted)
Pt came to recovery c/o abdominal cramping without n/v VSS, Abdomen firm to the touch Levsin x2 given subligual

## 2015-04-08 NOTE — Progress Notes (Deleted)
Pt feeling much better with repositioning, medication relieved sx's- Sharee Pimple Amee Boothe,RN

## 2015-04-08 NOTE — Patient Instructions (Addendum)

## 2015-04-08 NOTE — Progress Notes (Signed)
Transferred to recovery room. A/O x3, pleased with MAC.  VSS.  Report to Jill, RN. 

## 2015-04-08 NOTE — Op Note (Signed)
[facility]  [address] [place], [id number]   COLONOSCOPY PROCEDURE REPORT  PATIENT: Oisin, Yoakum  MR#: [id number] BIRTHDATE: 1941-02-05 , 74  yrs. old GENDER: male ENDOSCOPIST: Jerene Bears, MD PROCEDURE DATE:  04/08/2015 PROCEDURE:   Colonoscopy, surveillance , Colonoscopy with cold biopsy polypectomy, and Colonoscopy with snare polypectomy First Screening Colonoscopy - Avg.  risk and is 50 yrs.  old or older - No.  Prior Negative Screening - Now for repeat screening. N/A  History of Adenoma - Now for follow-up colonoscopy & has been > or = to 3 yrs.  Yes hx of adenoma.  Has been 3 or more years since last colonoscopy.  Polyps removed today? Yes ASA CLASS:   Class III INDICATIONS:Surveillance due to prior colonic neoplasia and PH Colon Adenoma. MEDICATIONS: Monitored anesthesia care and Propofol 250 mg IV  DESCRIPTION OF PROCEDURE:   After the risks benefits and alternatives of the procedure were thoroughly explained, informed consent was obtained.  The digital rectal exam revealed no rectal mass.   The [id number]  endoscope was introduced through the anus and advanced to the cecum, which was identified by both the appendix and ileocecal valve. No adverse events experienced. The quality of the prep was good.  (Suprep was used)  The instrument was then slowly withdrawn as the colon was fully examined. Estimated blood loss is zero unless otherwise noted in this procedure report.   COLON FINDINGS: Five sessile polyps ranging between 3-68mm in size were found in the ascending colon (2), at the cecum (1), in the sigmoid colon (1), and transverse colon (1).  Polypectomies were performed with cold forceps (2) and with a cold snare (3).  The resection was complete, the polyp tissue was completely retrieved and sent to histology.   There was severe diverticulosis noted in the descending colon and sigmoid colon.  Retroflexed views revealed internal  hemorrhoids. The time to cecum = 1.7 Withdrawal time = 13.4   The scope was withdrawn and the procedure completed. COMPLICATIONS: There were no immediate complications.  ENDOSCOPIC IMPRESSION: 1.   Five sessile polyps ranging between 3-59mm in size were found in the ascending colon, at the cecum, in the sigmoid colon, and transverse colon; polypectomies were performed with cold forceps and with a cold snare 2.   Severe diverticulosis was noted in the descending colon and sigmoid colon  RECOMMENDATIONS: 1.  Await pathology results 2.  High fiber diet 3.  Timing of repeat colonoscopy will be determined by pathology findings. 4.  You will receive a letter within 1-2 weeks with the results of your biopsy as well as final recommendations.  Please call my office if you have not received a letter after 3 weeks.  eSigned:  Jerene Bears, MD 04/08/2015 11:30 AM   cc: Rosalita Chessman, DO and The Patient   PATIENT NAME:  Kingsly, Kloepfer MR#: [id number]

## 2015-04-09 ENCOUNTER — Telehealth: Payer: Self-pay

## 2015-04-09 NOTE — Telephone Encounter (Signed)
Left message on answering machine. 

## 2015-04-13 ENCOUNTER — Encounter: Payer: Self-pay | Admitting: Internal Medicine

## 2015-05-31 ENCOUNTER — Other Ambulatory Visit: Payer: Self-pay

## 2015-05-31 DIAGNOSIS — I10 Essential (primary) hypertension: Secondary | ICD-10-CM

## 2015-05-31 MED ORDER — FENOFIBRATE 160 MG PO TABS: 160.0000 mg | ORAL_TABLET | Freq: Every day | ORAL | Status: DC

## 2015-05-31 MED ORDER — LISINOPRIL 20 MG PO TABS: 20.0000 mg | ORAL_TABLET | Freq: Every day | ORAL | Status: DC

## 2015-05-31 NOTE — Telephone Encounter (Signed)
Rx for Lisinopril and Fenofibrate sent to the pharmacy.      KP

## 2015-08-06 ENCOUNTER — Ambulatory Visit (INDEPENDENT_AMBULATORY_CARE_PROVIDER_SITE_OTHER): Payer: Medicare HMO | Admitting: Family Medicine

## 2015-08-06 ENCOUNTER — Telehealth: Payer: Self-pay | Admitting: *Deleted

## 2015-08-06 ENCOUNTER — Encounter: Payer: Self-pay | Admitting: Family Medicine

## 2015-08-06 VITALS — BP 156/86 | HR 66 | Temp 97.8°F | Wt 213.0 lb

## 2015-08-06 DIAGNOSIS — I10 Essential (primary) hypertension: Secondary | ICD-10-CM | POA: Diagnosis not present

## 2015-08-06 DIAGNOSIS — E781 Pure hyperglyceridemia: Secondary | ICD-10-CM

## 2015-08-06 DIAGNOSIS — G4700 Insomnia, unspecified: Secondary | ICD-10-CM | POA: Diagnosis not present

## 2015-08-06 LAB — COMPREHENSIVE METABOLIC PANEL
ALBUMIN: 4.1 g/dL (ref 3.5–5.2)
ALK PHOS: 45 U/L (ref 39–117)
ALT: 16 U/L (ref 0–53)
AST: 18 U/L (ref 0–37)
BUN: 15 mg/dL (ref 6–23)
CO2: 28 mEq/L (ref 19–32)
Calcium: 9.6 mg/dL (ref 8.4–10.5)
Chloride: 108 mEq/L (ref 96–112)
Creatinine, Ser: 0.96 mg/dL (ref 0.40–1.50)
GFR: 80.97 mL/min (ref >60.00)
Glucose, Bld: 129 mg/dL — ABNORMAL HIGH (ref 70–99)
POTASSIUM: 3.8 meq/L (ref 3.5–5.1)
Sodium: 142 mEq/L (ref 135–145)
TOTAL PROTEIN: 6.7 g/dL (ref 6.0–8.3)
Total Bilirubin: 0.5 mg/dL (ref 0.2–1.2)

## 2015-08-06 LAB — LIPID PANEL
CHOL/HDL RATIO: 4
Cholesterol: 126 mg/dL (ref 0–200)
HDL: 31.1 mg/dL — AB (ref >39.00)
LDL CALC: 69 mg/dL (ref 0–99)
NonHDL: 94.66
TRIGLYCERIDES: 129 mg/dL (ref 0.0–149.0)
VLDL: 25.8 mg/dL (ref 0.0–40.0)

## 2015-08-06 MED ORDER — LISINOPRIL 40 MG PO TABS: 40.0000 mg | ORAL_TABLET | Freq: Every day | ORAL | Status: DC

## 2015-08-06 MED ORDER — ZALEPLON 10 MG PO CAPS: 10.0000 mg | ORAL_CAPSULE | ORAL | Status: DC | PRN

## 2015-08-06 NOTE — Progress Notes (Signed)
Patient ID: Phillip [name], male    DOB: May 19, 1941  Age: 74 y.o. MRN: [id number]    Subjective:  Subjective HPI Phillip [name] presents for bp -- it has been running high at home and he c/o "burst blood vessel" in L eye.  Few weeks ago he woke up with red R eye.  No other symptoms.    bp running high at home.    Review of Systems  Constitutional: Negative for diaphoresis, appetite change, fatigue and unexpected weight change.  Eyes: Negative for pain, redness and visual disturbance.  Respiratory: Negative for cough, chest tightness, shortness of breath and wheezing.   Cardiovascular: Negative for chest pain, palpitations and leg swelling.  Endocrine: Negative for cold intolerance, heat intolerance, polydipsia, polyphagia and polyuria.  Genitourinary: Negative for dysuria, frequency and difficulty urinating.  Neurological: Negative for dizziness, light-headedness, numbness and headaches.    History Past Medical History  Diagnosis Date  . Diarrhea   . Sinus bradycardia   . Neck pain   . Coronary artery disease   . Unspecified essential hypertension   . History of benign prostatic hypertrophy   . Thrombocytopenia ([place]) 02/02/2012  . History of radiation therapy 01/31/10-03/22/10    right tonsil/right neck node 7000 cGy 35 sessions, high risk lymph node volume 5940 cGy 35 sessions, low risk lymph node vol 5600 cGy 35 sessions  . HTN (hypertension)   . Anxiety   . Thrombocytopenia ([place])     unclear etiology  . 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 Tinnitus   . Hearing loss   . Hypothyroid 01/16/2013  . Squamous cell carcinoma ([place])     right tonsil- 2011  . Hyperlipidemia     diet controlled- taking medication d/t Ensure drinking    He has past surgical history that includes Tonsillectomy; Vasectomy; Knee arthroscopy; Dental surgery; PEG placement; and Colonoscopy.   His family history includes Coronary artery disease in his father; Heart attack in his father; Heart disease in his father. There  is no history of Colon cancer, Esophageal cancer, Rectal cancer, or Stomach cancer.He reports that he has never smoked. He has never used smokeless tobacco. He reports that he does not drink alcohol or use illicit drugs.  Current Outpatient Prescriptions on File Prior to Visit  Medication Sig Dispense Refill  . aspirin 81 MG tablet Take 81 mg by mouth daily.      . Cholecalciferol (VITAMIN D PO) Take by mouth daily.    Marland Kitchen docusate sodium (STOOL SOFTENER) 250 MG capsule Take 250 mg by mouth daily.    Marland Kitchen ENSURE PLUS (ENSURE PLUS) LIQD 1 can 8 x a day 240 Can 11  . fenofibrate 160 MG tablet Take 1 tablet (160 mg total) by mouth daily. 30 tablet 5  . Flaxseed, Linseed, (FLAX SEEDS PO) Take by mouth daily.    . fluticasone (FLONASE) 50 MCG/ACT nasal spray     . ibuprofen (ADVIL,MOTRIN) 200 MG tablet Take 200 mg by mouth as needed.      . Magnesium 200 MG TABS Take by mouth daily.    . Melatonin 3 MG CAPS Take 1 capsule by mouth at bedtime as needed.     . Multiple Vitamin (MULTIVITAMIN) tablet Take 1 tablet by mouth daily.    Marland Kitchen Resveratrol 250 MG CAPS Take 250 mg by mouth daily.    . SF 5000 PLUS 1.1 % CREA dental cream     . vitamin C (ASCORBIC ACID) 500 MG tablet Take 500  mg by mouth daily.    . vitamin E 400 UNIT capsule Take 400 Units by mouth daily.     No current facility-administered medications on file prior to visit.     Objective:  Objective Physical Exam  Constitutional: He is oriented to person, place, and time. Vital signs are normal. He appears well-developed and well-nourished. He is sleeping.  HENT:  Head: Normocephalic and atraumatic.  Mouth/Throat: Oropharynx is clear and moist.  Eyes: EOM are normal. Pupils are equal, round, and reactive to light.  Neck: Normal range of motion. Neck supple. No thyromegaly present.  Cardiovascular: Normal rate and regular rhythm.   No murmur heard. Pulmonary/Chest: Effort normal and breath sounds normal. No respiratory distress. He has no  wheezes. He has no rales. He exhibits no tenderness.  Musculoskeletal: He exhibits no edema or tenderness.  Neurological: He is alert and oriented to person, place, and time.  Skin: Skin is warm and dry.  Psychiatric: He has a normal mood and affect. His behavior is normal. Judgment and thought content normal.  Nursing note and vitals reviewed.  BP 156/86 mmHg  Pulse 66  Temp(Src) 97.8 F (36.6 C) (Oral)  Wt 213 lb (96.616 kg)  SpO2 98% Wt Readings from Last 3 Encounters:  08/06/15 213 lb (96.616 kg)  04/08/15 214 lb (97.07 kg)  03/25/15 214 lb 12.8 oz (97.433 kg)     Lab Results  Component Value Date   WBC 5.8 10/13/2014   HGB 17.7* 10/13/2014   HCT 50.7 10/13/2014   PLT 146.0* 10/13/2014   GLUCOSE 129* 08/06/2015   CHOL 126 08/06/2015   TRIG 129.0 08/06/2015   HDL 31.10* 08/06/2015   LDLDIRECT 101.0 10/13/2014   LDLCALC 69 08/06/2015   ALT 16 08/06/2015   AST 18 08/06/2015   NA 142 08/06/2015   K 3.8 08/06/2015   CL 108 08/06/2015   CREATININE 0.96 08/06/2015   BUN 15 08/06/2015   CO2 28 08/06/2015   TSH 3.97 01/21/2015   PSA 0.36 10/13/2014   MICROALBUR 0.9 10/13/2014    No results found.   Assessment & Plan:  Plan I have discontinued Mr. Dunklee lisinopril. I am also having him start on lisinopril. Additionally, I am having him maintain his ibuprofen, Melatonin, aspirin, multivitamin, Magnesium, fluticasone, vitamin E, vitamin C, Resveratrol, docusate sodium, SF 5000 PLUS, (Flaxseed, Linseed, (FLAX SEEDS PO)), ENSURE PLUS, Cholecalciferol (VITAMIN D PO), fenofibrate, and zaleplon.  Meds ordered this encounter  Medications  . lisinopril (PRINIVIL,ZESTRIL) 40 MG tablet    Sig: Take 1 tablet (40 mg total) by mouth daily.    Dispense:  90 tablet    Refill:  3  . zaleplon (SONATA) 10 MG capsule    Sig: Take 1 capsule (10 mg total) by mouth as needed for sleep.    Dispense:  90 capsule    Refill:  1    Problem List Items Addressed This Visit    HTN  (hypertension) - Primary    Inc lisinopril 40 mg qd Recheck 2-3 weeks      Relevant Medications   lisinopril (PRINIVIL,ZESTRIL) 40 MG tablet   Other Relevant Orders   Lipid panel (Completed)   Comp Met (CMET) (Completed)    Other Visit Diagnoses    Hypertriglyceridemia        Relevant Medications    lisinopril (PRINIVIL,ZESTRIL) 40 MG tablet    Other Relevant Orders    Lipid panel (Completed)    Comp Met (CMET) (Completed)    Insomnia  Relevant Medications    zaleplon (SONATA) 10 MG capsule       Follow-up: Return in about 2 weeks (around 08/20/2015), or if symptoms worsen or fail to improve, for hypertension.  Garnet Koyanagi, DO

## 2015-08-06 NOTE — Patient Instructions (Signed)
Hypertension Hypertension, commonly called high blood pressure, is when the force of blood pumping through your arteries is too strong. Your arteries are the blood vessels that carry blood from your heart throughout your body. A blood pressure reading consists of a higher number over a lower number, such as 110/72. The higher number (systolic) is the pressure inside your arteries when your heart pumps. The lower number (diastolic) is the pressure inside your arteries when your heart relaxes. Ideally you want your blood pressure below 120/80. Hypertension forces your heart to work harder to pump blood. Your arteries may become narrow or stiff. Having untreated or uncontrolled hypertension can cause heart attack, stroke, kidney disease, and other problems. RISK FACTORS Some risk factors for high blood pressure are controllable. Others are not.  Risk factors you cannot control include:   Race. You may be at higher risk if you are African American.  Age. Risk increases with age.  Gender. Men are at higher risk than women before age 45 years. After age 65, women are at higher risk than men. Risk factors you can control include:  Not getting enough exercise or physical activity.  Being overweight.  Getting too much fat, sugar, calories, or salt in your diet.  Drinking too much alcohol. SIGNS AND SYMPTOMS Hypertension does not usually cause signs or symptoms. Extremely high blood pressure (hypertensive crisis) may cause headache, anxiety, shortness of breath, and nosebleed. DIAGNOSIS To check if you have hypertension, your health care provider will measure your blood pressure while you are seated, with your arm held at the level of your heart. It should be measured at least twice using the same arm. Certain conditions can cause a difference in blood pressure between your right and left arms. A blood pressure reading that is higher than normal on one occasion does not mean that you need treatment. If  it is not clear whether you have high blood pressure, you may be asked to return on a different day to have your blood pressure checked again. Or, you may be asked to monitor your blood pressure at home for 1 or more weeks. TREATMENT Treating high blood pressure includes making lifestyle changes and possibly taking medicine. Living a healthy lifestyle can help lower high blood pressure. You may need to change some of your habits. Lifestyle changes may include:  Following the DASH diet. This diet is high in fruits, vegetables, and whole grains. It is low in salt, red meat, and added sugars.  Keep your sodium intake below 2,300 mg per day.  Getting at least 30-45 minutes of aerobic exercise at least 4 times per week.  Losing weight if necessary.  Not smoking.  Limiting alcoholic beverages.  Learning ways to reduce stress. Your health care provider may prescribe medicine if lifestyle changes are not enough to get your blood pressure under control, and if one of the following is true:  You are 18-59 years of age and your systolic blood pressure is above 140.  You are 60 years of age or older, and your systolic blood pressure is above 150.  Your diastolic blood pressure is above 90.  You have diabetes, and your systolic blood pressure is over 140 or your diastolic blood pressure is over 90.  You have kidney disease and your blood pressure is above 140/90.  You have heart disease and your blood pressure is above 140/90. Your personal target blood pressure may vary depending on your medical conditions, your age, and other factors. HOME CARE INSTRUCTIONS    Have your blood pressure rechecked as directed by your health care provider.   Take medicines only as directed by your health care provider. Follow the directions carefully. Blood pressure medicines must be taken as prescribed. The medicine does not work as well when you skip doses. Skipping doses also puts you at risk for  problems.  Do not smoke.   Monitor your blood pressure at home as directed by your health care provider. SEEK MEDICAL CARE IF:   You think you are having a reaction to medicines taken.  You have recurrent headaches or feel dizzy.  You have swelling in your ankles.  You have trouble with your vision. SEEK IMMEDIATE MEDICAL CARE IF:  You develop a severe headache or confusion.  You have unusual weakness, numbness, or feel faint.  You have severe chest or abdominal pain.  You vomit repeatedly.  You have trouble breathing. MAKE SURE YOU:   Understand these instructions.  Will watch your condition.  Will get help right away if you are not doing well or get worse.   This information is not intended to replace advice given to you by your health care provider. Make sure you discuss any questions you have with your health care provider.   Document Released: 08/07/2005 Document Revised: 12/22/2014 Document Reviewed: 05/30/2013 Elsevier Interactive Patient Education 2016 [employer]  

## 2015-08-06 NOTE — Telephone Encounter (Signed)
Received fax request for PA on Zaleplon 10 mg caps via [facility], initiated via Cover My Meds, awaiting Approval/SLS

## 2015-08-06 NOTE — Assessment & Plan Note (Signed)
Inc lisinopril 40 mg qd Recheck 2-3 weeks

## 2015-08-06 NOTE — Progress Notes (Signed)
Pre visit review using our clinic review tool, if applicable. No additional management support is needed unless otherwise documented below in the visit note. 

## 2015-08-09 NOTE — Telephone Encounter (Signed)
Received Approval via fax from [place] valid 07/3014 through 08/21/15, sent copy to pharmacy/SLS

## 2015-08-20 ENCOUNTER — Ambulatory Visit (INDEPENDENT_AMBULATORY_CARE_PROVIDER_SITE_OTHER): Payer: Medicare HMO | Admitting: Family Medicine

## 2015-08-20 ENCOUNTER — Encounter: Payer: Self-pay | Admitting: Family Medicine

## 2015-08-20 VITALS — BP 138/76 | HR 78 | Temp 97.8°F | Ht 74.0 in | Wt 213.0 lb

## 2015-08-20 DIAGNOSIS — R739 Hyperglycemia, unspecified: Secondary | ICD-10-CM

## 2015-08-20 DIAGNOSIS — I10 Essential (primary) hypertension: Secondary | ICD-10-CM | POA: Diagnosis not present

## 2015-08-20 DIAGNOSIS — G4700 Insomnia, unspecified: Secondary | ICD-10-CM

## 2015-08-20 DIAGNOSIS — R7303 Prediabetes: Secondary | ICD-10-CM | POA: Insufficient documentation

## 2015-08-20 MED ORDER — ZALEPLON 10 MG PO CAPS: 10.0000 mg | ORAL_CAPSULE | ORAL | Status: DC | PRN

## 2015-08-20 NOTE — Progress Notes (Signed)
Pre visit review using our clinic review tool, if applicable. No additional management support is needed unless otherwise documented below in the visit note. 

## 2015-08-20 NOTE — Progress Notes (Signed)
Patient ID: Broden Mazin, male    DOB: 06/25/1941  Age: 74 y.o. MRN: [id number]    Subjective:  Subjective HPI Aveion Cumba presents for f/u bp and cholesterol.   No other complaints.   Pt has been drinking 2 cans of ensure 4x a day---  20 g sugars each.   He mentions this because his glucose was elevated on his blood work.    Review of Systems  Constitutional: Negative for diaphoresis, appetite change, fatigue and unexpected weight change.  Eyes: Negative for pain, redness and visual disturbance.  Respiratory: Negative for cough, chest tightness, shortness of breath and wheezing.   Cardiovascular: Negative for chest pain, palpitations and leg swelling.  Endocrine: Negative for cold intolerance, heat intolerance, polydipsia, polyphagia and polyuria.  Genitourinary: Negative for dysuria, frequency and difficulty urinating.  Neurological: Negative for dizziness, light-headedness, numbness and headaches.    History Past Medical History  Diagnosis Date  . Diarrhea   . Sinus bradycardia   . Neck pain   . Coronary artery disease   . Unspecified essential hypertension   . History of benign prostatic hypertrophy   . Thrombocytopenia ([place]) 02/02/2012  . History of radiation therapy 01/31/10-03/22/10    right tonsil/right neck node 7000 cGy 35 sessions, high risk lymph node volume 5940 cGy 35 sessions, low risk lymph node vol 5600 cGy 35 sessions  . HTN (hypertension)   . Anxiety   . Thrombocytopenia ([place])     unclear etiology  . 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 Tinnitus   . Hearing loss   . Hypothyroid 01/16/2013  . Squamous cell carcinoma ([place])     right tonsil- 2011  . Hyperlipidemia     diet controlled- taking medication d/t Ensure drinking    He has past surgical history that includes Tonsillectomy; Vasectomy; Knee arthroscopy; Dental surgery; PEG placement; and Colonoscopy.   His family history includes Coronary artery disease in his father; Heart attack in his father; Heart disease in  his father. There is no history of Colon cancer, Esophageal cancer, Rectal cancer, or Stomach cancer.He reports that he has never smoked. He has never used smokeless tobacco. He reports that he does not drink alcohol or use illicit drugs.  Current Outpatient Prescriptions on File Prior to Visit  Medication Sig Dispense Refill  . aspirin 81 MG tablet Take 81 mg by mouth daily.      . Cholecalciferol (VITAMIN D PO) Take by mouth daily.    Marland Kitchen docusate sodium (STOOL SOFTENER) 250 MG capsule Take 250 mg by mouth daily.    Marland Kitchen ENSURE PLUS (ENSURE PLUS) LIQD 1 can 8 x a day 240 Can 11  . fenofibrate 160 MG tablet Take 1 tablet (160 mg total) by mouth daily. 30 tablet 5  . Flaxseed, Linseed, (FLAX SEEDS PO) Take by mouth daily.    . fluticasone (FLONASE) 50 MCG/ACT nasal spray     . ibuprofen (ADVIL,MOTRIN) 200 MG tablet Take 200 mg by mouth as needed.      Marland Kitchen lisinopril (PRINIVIL,ZESTRIL) 40 MG tablet Take 1 tablet (40 mg total) by mouth daily. 90 tablet 3  . Magnesium 200 MG TABS Take by mouth daily.    . Melatonin 3 MG CAPS Take 1 capsule by mouth at bedtime as needed.     . Multiple Vitamin (MULTIVITAMIN) tablet Take 1 tablet by mouth daily.    . SF 5000 PLUS 1.1 % CREA dental cream     . vitamin C (ASCORBIC ACID)  500 MG tablet Take 500 mg by mouth daily.    . vitamin E 400 UNIT capsule Take 400 Units by mouth daily.    Marland Kitchen Resveratrol 250 MG CAPS Take 250 mg by mouth daily. Reported on 08/20/2015     No current facility-administered medications on file prior to visit.     Objective:  Objective Physical Exam  Constitutional: He is oriented to person, place, and time. Vital signs are normal. He appears well-developed and well-nourished. He is sleeping.  HENT:  Head: Normocephalic and atraumatic.  Mouth/Throat: Oropharynx is clear and moist.  Eyes: EOM are normal. Pupils are equal, round, and reactive to light.  Neck: Normal range of motion. Neck supple. No thyromegaly present.  Cardiovascular:  Normal rate and regular rhythm.   No murmur heard. Pulmonary/Chest: Effort normal and breath sounds normal. No respiratory distress. He has no wheezes. He has no rales. He exhibits no tenderness.  Musculoskeletal: He exhibits no edema or tenderness.  Neurological: He is alert and oriented to person, place, and time.  Skin: Skin is warm and dry.  Psychiatric: He has a normal mood and affect. His behavior is normal. Judgment and thought content normal.  Nursing note and vitals reviewed.  BP 138/76 mmHg  Pulse 78  Temp(Src) 97.8 F (36.6 C) (Oral)  Ht 6\' 2"  (1.88 m)  Wt 213 lb (96.616 kg)  BMI 27.34 kg/m2  SpO2 96% Wt Readings from Last 3 Encounters:  08/20/15 213 lb (96.616 kg)  08/06/15 213 lb (96.616 kg)  04/08/15 214 lb (97.07 kg)     Lab Results  Component Value Date   WBC 5.8 10/13/2014   HGB 17.7* 10/13/2014   HCT 50.7 10/13/2014   PLT 146.0* 10/13/2014   GLUCOSE 129* 08/06/2015   CHOL 126 08/06/2015   TRIG 129.0 08/06/2015   HDL 31.10* 08/06/2015   LDLDIRECT 101.0 10/13/2014   LDLCALC 69 08/06/2015   ALT 16 08/06/2015   AST 18 08/06/2015   NA 142 08/06/2015   K 3.8 08/06/2015   CL 108 08/06/2015   CREATININE 0.96 08/06/2015   BUN 15 08/06/2015   CO2 28 08/06/2015   TSH 3.97 01/21/2015   PSA 0.36 10/13/2014   MICROALBUR 0.9 10/13/2014    No results found.   Assessment & Plan:  Plan I am having Mr. Shangraw maintain his ibuprofen, Melatonin, aspirin, multivitamin, Magnesium, fluticasone, vitamin E, vitamin C, Resveratrol, docusate sodium, SF 5000 PLUS, (Flaxseed, Linseed, (FLAX SEEDS PO)), ENSURE PLUS, Cholecalciferol (VITAMIN D PO), fenofibrate, lisinopril, and zaleplon.  Meds ordered this encounter  Medications  . zaleplon (SONATA) 10 MG capsule    Sig: Take 1 capsule (10 mg total) by mouth as needed for sleep.    Dispense:  90 capsule    Refill:  1    Problem List Items Addressed This Visit    Hyperglycemia    con't to watch diet Pt had 2 ensure  before labs last ov Repeat at cpe      Essential hypertension    con't lisinopril 40 mg  F/u cpe       Other Visit Diagnoses    Insomnia    -  Primary    Relevant Medications    zaleplon (SONATA) 10 MG capsule       Follow-up: Return if symptoms worsen or fail to improve, for as scheduled.  Garnet Koyanagi, DO

## 2015-08-20 NOTE — Patient Instructions (Signed)
Hypertension Hypertension, commonly called high blood pressure, is when the force of blood pumping through your arteries is too strong. Your arteries are the blood vessels that carry blood from your heart throughout your body. A blood pressure reading consists of a higher number over a lower number, such as 110/72. The higher number (systolic) is the pressure inside your arteries when your heart pumps. The lower number (diastolic) is the pressure inside your arteries when your heart relaxes. Ideally you want your blood pressure below 120/80. Hypertension forces your heart to work harder to pump blood. Your arteries may become narrow or stiff. Having untreated or uncontrolled hypertension can cause heart attack, stroke, kidney disease, and other problems. RISK FACTORS Some risk factors for high blood pressure are controllable. Others are not.  Risk factors you cannot control include:   Race. You may be at higher risk if you are African American.  Age. Risk increases with age.  Gender. Men are at higher risk than women before age 45 years. After age 65, women are at higher risk than men. Risk factors you can control include:  Not getting enough exercise or physical activity.  Being overweight.  Getting too much fat, sugar, calories, or salt in your diet.  Drinking too much alcohol. SIGNS AND SYMPTOMS Hypertension does not usually cause signs or symptoms. Extremely high blood pressure (hypertensive crisis) may cause headache, anxiety, shortness of breath, and nosebleed. DIAGNOSIS To check if you have hypertension, your health care provider will measure your blood pressure while you are seated, with your arm held at the level of your heart. It should be measured at least twice using the same arm. Certain conditions can cause a difference in blood pressure between your right and left arms. A blood pressure reading that is higher than normal on one occasion does not mean that you need treatment. If  it is not clear whether you have high blood pressure, you may be asked to return on a different day to have your blood pressure checked again. Or, you may be asked to monitor your blood pressure at home for 1 or more weeks. TREATMENT Treating high blood pressure includes making lifestyle changes and possibly taking medicine. Living a healthy lifestyle can help lower high blood pressure. You may need to change some of your habits. Lifestyle changes may include:  Following the DASH diet. This diet is high in fruits, vegetables, and whole grains. It is low in salt, red meat, and added sugars.  Keep your sodium intake below 2,300 mg per day.  Getting at least 30-45 minutes of aerobic exercise at least 4 times per week.  Losing weight if necessary.  Not smoking.  Limiting alcoholic beverages.  Learning ways to reduce stress. Your health care provider may prescribe medicine if lifestyle changes are not enough to get your blood pressure under control, and if one of the following is true:  You are 18-59 years of age and your systolic blood pressure is above 140.  You are 60 years of age or older, and your systolic blood pressure is above 150.  Your diastolic blood pressure is above 90.  You have diabetes, and your systolic blood pressure is over 140 or your diastolic blood pressure is over 90.  You have kidney disease and your blood pressure is above 140/90.  You have heart disease and your blood pressure is above 140/90. Your personal target blood pressure may vary depending on your medical conditions, your age, and other factors. HOME CARE INSTRUCTIONS    Have your blood pressure rechecked as directed by your health care provider.   Take medicines only as directed by your health care provider. Follow the directions carefully. Blood pressure medicines must be taken as prescribed. The medicine does not work as well when you skip doses. Skipping doses also puts you at risk for  problems.  Do not smoke.   Monitor your blood pressure at home as directed by your health care provider. SEEK MEDICAL CARE IF:   You think you are having a reaction to medicines taken.  You have recurrent headaches or feel dizzy.  You have swelling in your ankles.  You have trouble with your vision. SEEK IMMEDIATE MEDICAL CARE IF:  You develop a severe headache or confusion.  You have unusual weakness, numbness, or feel faint.  You have severe chest or abdominal pain.  You vomit repeatedly.  You have trouble breathing. MAKE SURE YOU:   Understand these instructions.  Will watch your condition.  Will get help right away if you are not doing well or get worse.   This information is not intended to replace advice given to you by your health care provider. Make sure you discuss any questions you have with your health care provider.   Document Released: 08/07/2005 Document Revised: 12/22/2014 Document Reviewed: 05/30/2013 Elsevier Interactive Patient Education 2016 [employer]  

## 2015-08-20 NOTE — Assessment & Plan Note (Signed)
con't lisinopril 40 mg  F/u cpe

## 2015-08-20 NOTE — Assessment & Plan Note (Signed)
con't to watch diet Pt had 2 ensure before labs last ov Repeat at cpe

## 2015-09-22 ENCOUNTER — Telehealth: Payer: Self-pay | Admitting: General Practice

## 2015-09-22 NOTE — Telephone Encounter (Signed)
PA began for zaleplon through covermymeds

## 2015-09-24 NOTE — Telephone Encounter (Signed)
Rceived Approval from [place] valid through 08/20/16/SLS 02/03

## 2015-10-20 ENCOUNTER — Telehealth: Payer: Self-pay | Admitting: Behavioral Health

## 2015-10-20 NOTE — Telephone Encounter (Signed)
Unable to reach patient at time of Pre-Visit Call.  Left message for patient to return call when available.    

## 2015-10-21 ENCOUNTER — Encounter: Payer: Self-pay | Admitting: Family Medicine

## 2015-10-21 ENCOUNTER — Ambulatory Visit (INDEPENDENT_AMBULATORY_CARE_PROVIDER_SITE_OTHER): Payer: PPO | Admitting: Family Medicine

## 2015-10-21 VITALS — BP 124/80 | HR 48 | Temp 97.7°F | Ht 74.0 in | Wt 214.0 lb

## 2015-10-21 DIAGNOSIS — E785 Hyperlipidemia, unspecified: Secondary | ICD-10-CM

## 2015-10-21 DIAGNOSIS — G4700 Insomnia, unspecified: Secondary | ICD-10-CM

## 2015-10-21 DIAGNOSIS — E781 Pure hyperglyceridemia: Secondary | ICD-10-CM

## 2015-10-21 DIAGNOSIS — H9193 Unspecified hearing loss, bilateral: Secondary | ICD-10-CM

## 2015-10-21 DIAGNOSIS — N400 Enlarged prostate without lower urinary tract symptoms: Secondary | ICD-10-CM | POA: Diagnosis not present

## 2015-10-21 DIAGNOSIS — I10 Essential (primary) hypertension: Secondary | ICD-10-CM

## 2015-10-21 DIAGNOSIS — Z0000 Encounter for general adult medical examination without abnormal findings: Secondary | ICD-10-CM | POA: Diagnosis not present

## 2015-10-21 DIAGNOSIS — D229 Melanocytic nevi, unspecified: Secondary | ICD-10-CM

## 2015-10-21 LAB — CBC WITH DIFFERENTIAL/PLATELET
BASOS ABS: 0 10*3/uL (ref 0.0–0.1)
Basophils Relative: 0 % (ref 0.0–3.0)
EOS ABS: 0.1 10*3/uL (ref 0.0–0.7)
Eosinophils Relative: 1.3 % (ref 0.0–5.0)
HEMATOCRIT: 47 % (ref 39.0–52.0)
HEMOGLOBIN: 16 g/dL (ref 13.0–17.0)
LYMPHS PCT: 11.2 % — AB (ref 12.0–46.0)
Lymphs Abs: 0.5 10*3/uL — ABNORMAL LOW (ref 0.7–4.0)
MCHC: 34.1 g/dL (ref 30.0–36.0)
MCV: 96.1 fl (ref 78.0–100.0)
MONOS PCT: 10 % (ref 3.0–12.0)
Monocytes Absolute: 0.5 10*3/uL (ref 0.1–1.0)
NEUTROS ABS: 3.8 10*3/uL (ref 1.4–7.7)
Neutrophils Relative %: 77.5 % — ABNORMAL HIGH (ref 43.0–77.0)
PLATELETS: 156 10*3/uL (ref 150.0–400.0)
RBC: 4.89 Mil/uL (ref 4.22–5.81)
RDW: 13 % (ref 11.5–15.5)
WBC: 4.9 10*3/uL (ref 4.0–10.5)

## 2015-10-21 LAB — LIPID PANEL
CHOL/HDL RATIO: 4
Cholesterol: 130 mg/dL (ref 0–200)
HDL: 35.2 mg/dL — AB (ref >39.00)
LDL CALC: 75 mg/dL (ref 0–99)
NONHDL: 95.05
TRIGLYCERIDES: 101 mg/dL (ref 0.0–149.0)
VLDL: 20.2 mg/dL (ref 0.0–40.0)

## 2015-10-21 LAB — COMPREHENSIVE METABOLIC PANEL
ALT: 20 U/L (ref 0–53)
AST: 22 U/L (ref 0–37)
Albumin: 4.4 g/dL (ref 3.5–5.2)
Alkaline Phosphatase: 39 U/L (ref 39–117)
BILIRUBIN TOTAL: 0.7 mg/dL (ref 0.2–1.2)
BUN: 19 mg/dL (ref 6–23)
CHLORIDE: 105 meq/L (ref 96–112)
CO2: 30 meq/L (ref 19–32)
CREATININE: 1.08 mg/dL (ref 0.40–1.50)
Calcium: 9.8 mg/dL (ref 8.4–10.5)
GFR: 70.64 mL/min (ref >60.00)
Glucose, Bld: 114 mg/dL — ABNORMAL HIGH (ref 70–99)
POTASSIUM: 4.5 meq/L (ref 3.5–5.1)
Sodium: 140 mEq/L (ref 135–145)
TOTAL PROTEIN: 6.8 g/dL (ref 6.0–8.3)

## 2015-10-21 LAB — PSA: PSA: 0.33 ng/mL (ref 0.10–4.00)

## 2015-10-21 NOTE — Progress Notes (Signed)
Subjective:   Phillip [name] is a 75 y.o. male who presents for Medicare Annual/Subsequent preventive examination.  Review of Systems:   Review of Systems  Constitutional: Negative for activity change, appetite change and fatigue.  HENT: Negative for hearing loss, congestion, tinnitus and ear discharge.   Eyes: Negative for visual disturbance (see optho q1y -- vision corrected to 20/20 with glasses).  Respiratory: Negative for cough, chest tightness and shortness of breath.   Cardiovascular: Negative for chest pain, palpitations and leg swelling.  Gastrointestinal: Negative for abdominal pain, diarrhea, constipation and abdominal distention.  Genitourinary: Negative for urgency, frequency, decreased urine volume and difficulty urinating.  Musculoskeletal: Negative for back pain, arthralgias and gait problem.  Skin: Negative for color change, pallor and rash.  Neurological: Negative for dizziness, light-headedness, numbness and headaches.  Hematological: Negative for adenopathy. Does not bruise/bleed easily.  Psychiatric/Behavioral: Negative for suicidal ideas, confusion, sleep disturbance, self-injury, dysphoric mood, decreased concentration and agitation.  Pt is able to read and write and can do all ADLs No risk for falling No abuse/ violence in home           Objective:    Vitals: BP 124/80 mmHg  Pulse 48  Temp(Src) 97.7 F (36.5 C) (Oral)  Ht '6\' 2"'  (1.88 m)  Wt 214 lb (97.07 kg)  BMI 27.46 kg/m2  SpO2 98% BP 124/80 mmHg  Pulse 48  Temp(Src) 97.7 F (36.5 C) (Oral)  Ht '6\' 2"'  (1.88 m)  Wt 214 lb (97.07 kg)  BMI 27.46 kg/m2  SpO2 98% General appearance: alert, cooperative, appears stated age and no distress Head: Normocephalic, without obvious abnormality, atraumatic Eyes: conjunctivae/corneas clear. PERRL, EOM's intact. Fundi benign. Ears: normal TM's and external ear canals both ears Nose: Nares normal. Septum midline. Mucosa normal. No drainage or sinus  tenderness. Throat: lips, mucosa, and tongue normal; teeth and gums normal Neck: no adenopathy, no carotid bruit, no JVD, supple, symmetrical, trachea midline and thyroid not enlarged, symmetric, no tenderness/mass/nodules Back: symmetric, no curvature. ROM normal. No CVA tenderness. Lungs: clear to auscultation bilaterally Chest wall: no tenderness Heart: regular rate and rhythm, S1, S2 normal, no murmur, click, rub or gallop Abdomen: soft, non-tender; bowel sounds normal; no masses,  no organomegaly Male genitalia: normal, penis: no lesions or discharge. testes: no masses or tenderness. no hernias Rectal: normal tone, normal prostate, no masses or tenderness Extremities: extremities normal, atraumatic, no cyanosis or edema Pulses: 2+ and symmetric Skin: Skin color, texture, turgor normal. No rashes or lesions Lymph nodes: Cervical, supraclavicular, and axillary nodes normal. Neurologic: Alert and oriented X 3, normal strength and tone. Normal symmetric reflexes. Normal coordination and gait Psych- no depression, no anxiety  Tobacco History  Smoking status  . Never Smoker   Smokeless tobacco  . Never Used     Counseling given: Not Answered   Past Medical History  Diagnosis Date  . Diarrhea   . Sinus bradycardia   . Neck pain   . Coronary artery disease   . Unspecified essential hypertension   . History of benign prostatic hypertrophy   . Thrombocytopenia ([place]) 02/02/2012  . History of radiation therapy 01/31/10-03/22/10    right tonsil/right neck node 7000 cGy 35 sessions, high risk lymph node volume 5940 cGy 35 sessions, low risk lymph node vol 5600 cGy 35 sessions  . HTN (hypertension)   . Anxiety   . Thrombocytopenia ([place])     unclear etiology  . 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 Tinnitus   .  Hearing loss   . Hypothyroid 01/16/2013  . Squamous cell carcinoma ([place])     right tonsil- 2011  . Hyperlipidemia     diet controlled- taking medication d/t Ensure drinking   Past  Surgical History  Procedure Laterality Date  . Tonsillectomy    . Vasectomy    . Knee arthroscopy      left knee  . Dental surgery      extractions  . Peg placement      July 2011  . Colonoscopy     Family History  Problem Relation Age of Onset  . Coronary artery disease Father   . Heart attack Father   . Heart disease Father   . Colon cancer Neg Hx   . Esophageal cancer Neg Hx   . Rectal cancer Neg Hx   . Stomach cancer Neg Hx    History  Sexual Activity  . Sexual Activity: Not on file    Outpatient Encounter Prescriptions as of 10/21/2015  Medication Sig  . aspirin 81 MG tablet Take 81 mg by mouth daily.    . Cholecalciferol (VITAMIN D PO) Take by mouth daily.  Marland Kitchen docusate sodium (STOOL SOFTENER) 250 MG capsule Take 250 mg by mouth daily.  Marland Kitchen ENSURE PLUS (ENSURE PLUS) LIQD 1 can 8 x a day  . fenofibrate 160 MG tablet Take 1 tablet (160 mg total) by mouth daily.  . Flaxseed, Linseed, (FLAX SEEDS PO) Take by mouth daily.  . fluticasone (FLONASE) 50 MCG/ACT nasal spray   . ibuprofen (ADVIL,MOTRIN) 200 MG tablet Take 200 mg by mouth as needed.    Marland Kitchen lisinopril (PRINIVIL,ZESTRIL) 40 MG tablet Take 1 tablet (40 mg total) by mouth daily.  . Magnesium 200 MG TABS Take by mouth daily.  . Melatonin 3 MG CAPS Take 1 capsule by mouth at bedtime as needed.   . Multiple Vitamin (MULTIVITAMIN) tablet Take 1 tablet by mouth daily.  Marland Kitchen Resveratrol 250 MG CAPS Take 250 mg by mouth daily. Reported on 08/20/2015  . SF 5000 PLUS 1.1 % CREA dental cream   . vitamin C (ASCORBIC ACID) 500 MG tablet Take 500 mg by mouth daily.  . vitamin E 400 UNIT capsule Take 400 Units by mouth daily.  . zaleplon (SONATA) 10 MG capsule Take 1 capsule (10 mg total) by mouth as needed for sleep.  . [DISCONTINUED] fenofibrate 160 MG tablet Take 1 tablet (160 mg total) by mouth daily.  . [DISCONTINUED] lisinopril (PRINIVIL,ZESTRIL) 40 MG tablet Take 1 tablet (40 mg total) by mouth daily.  . [DISCONTINUED] zaleplon  (SONATA) 10 MG capsule Take 1 capsule (10 mg total) by mouth as needed for sleep.   No facility-administered encounter medications on file as of 10/21/2015.    Activities of Daily Living In your present state of health, do you have any difficulty performing the following activities: 10/21/2015  Hearing? Y  Vision? N  Difficulty concentrating or making decisions? N  Walking or climbing stairs? N  Dressing or bathing? N  Doing errands, shopping? N    Patient Care Team: Rosalita Chessman, DO as PCP - General (Family Medicine) Rozetta Nunnery, MD Arloa Koh, MD (Radiation Oncology) Heath Lark, MD as Consulting Physician (Hematology and Oncology)   Assessment:    cpe  Exercise Activities and Dietary recommendations-----con't current exercise    Goals    None     Fall Risk Fall Risk  10/21/2015 10/13/2014 08/28/2014  Falls in the past year? No No No  Depression Screen PHQ 2/9 Scores 10/21/2015 10/13/2014 08/28/2014  PHQ - 2 Score 0 0 0    Cognitive Testing mmse 30/30  Immunization History  Administered Date(s) Administered  . H1N1 07/21/2008  . Influenza Split 06/20/2012  . Influenza Whole 07/02/2006, 05/21/2008, 05/21/2009  . Influenza,inj,Quad PF,36+ Mos 05/25/2014  . Influenza-Unspecified 05/21/2013, 06/06/2015  . Pneumococcal Conjugate-13 11/05/2013, 05/25/2014  . Pneumococcal Polysaccharide-23 07/02/2006  . Td 09/14/2008   Screening Tests Health Maintenance  Topic Date Due  . INFLUENZA VACCINE  03/21/2016  . COLONOSCOPY  04/07/2018  . TETANUS/TDAP  09/14/2018  . ZOSTAVAX  Addressed  . PNA vac Low Risk Adult  Completed      Plan:    see AVs During the course of the visit the patient was educated and counseled about the following appropriate screening and preventive services:   Vaccines to include Pneumoccal, Influenza, Hepatitis B, Td, Zostavax, HCV  Electrocardiogram  Cardiovascular Disease  Colorectal cancer screening  Diabetes screening  Prostate  Cancer Screening  Glaucoma screening  Nutrition counseling   Smoking cessation counseling  Patient Instructions (the written plan) was given to the patient.  1. Essential hypertension con't lisinopril  stable - Comp Met (CMET) - CBC with Differential/Platelet - POCT urinalysis dipstick  2. Hyperlipidemia   - Comp Met (CMET) - Lipid panel  3. BPH (benign prostatic hypertrophy)   - PSA  4. Hearing loss, bilateral   - Ambulatory referral to Audiology  5. Nevus   - Ambulatory referral to Dermatology  6. Routine history and physical examination of adult     Garnet Koyanagi, DO  10/24/2015

## 2015-10-21 NOTE — Progress Notes (Signed)
Pre visit review using our clinic review tool, if applicable. No additional management support is needed unless otherwise documented below in the visit note. 

## 2015-10-21 NOTE — Patient Instructions (Signed)

## 2015-10-24 DIAGNOSIS — E785 Hyperlipidemia, unspecified: Secondary | ICD-10-CM | POA: Insufficient documentation

## 2015-10-24 MED ORDER — ZALEPLON 10 MG PO CAPS: 10.0000 mg | ORAL_CAPSULE | ORAL | Status: DC | PRN

## 2015-10-24 MED ORDER — FENOFIBRATE 160 MG PO TABS: 160.0000 mg | ORAL_TABLET | Freq: Every day | ORAL | Status: DC

## 2015-10-24 MED ORDER — LISINOPRIL 40 MG PO TABS: 40.0000 mg | ORAL_TABLET | Freq: Every day | ORAL | Status: DC

## 2015-10-24 NOTE — Assessment & Plan Note (Signed)
con't lisinopril stable 

## 2015-10-24 NOTE — Assessment & Plan Note (Signed)
con't fenofibrate Check labs 

## 2015-11-25 ENCOUNTER — Other Ambulatory Visit: Payer: Self-pay | Admitting: Family Medicine

## 2015-12-16 ENCOUNTER — Ambulatory Visit: Payer: PPO | Attending: Family Medicine | Admitting: Audiology

## 2015-12-16 DIAGNOSIS — Z9289 Personal history of other medical treatment: Secondary | ICD-10-CM | POA: Diagnosis not present

## 2015-12-16 DIAGNOSIS — H93293 Other abnormal auditory perceptions, bilateral: Secondary | ICD-10-CM | POA: Diagnosis not present

## 2015-12-16 DIAGNOSIS — R94128 Abnormal results of other function studies of ear and other special senses: Secondary | ICD-10-CM | POA: Insufficient documentation

## 2015-12-16 DIAGNOSIS — Z01118 Encounter for examination of ears and hearing with other abnormal findings: Secondary | ICD-10-CM | POA: Insufficient documentation

## 2015-12-16 DIAGNOSIS — H903 Sensorineural hearing loss, bilateral: Secondary | ICD-10-CM

## 2015-12-16 NOTE — Patient Instructions (Signed)
Phillip [name] has a moderate sloping to severe high frequency hearing loss bilaterally that is primarily sensorineural. He has poor word recognition in quiet that becomes very poor in minimal background noise (20% correct in each ear).  Thoughts about what to do next: 1) I will investigate and get back with you next week about the latest technology for hearing and who is the best in the country for surgical options (such as [facility] has referred to).  2)  I will mail letter to you with this info next week.  3) After you get this info you may decide to pursue or not.  At the very least, a new hearing aid is most likely needed.   Deborah L. Heide Spark, Au.D., CCC-A Doctor of Audiology 12/16/2015

## 2015-12-16 NOTE — Procedures (Signed)
Outpatient Audiology and [facility]  [place], [place] [address]  [phone]   Audiological Evaluation  Patient Name: Phillip [name]    Status: Outpatient   DOB: June 19, 1941    Diagnosis: Hearing loss MRN: [id number] Date:  12/16/2015     Referent: Ann Held, DO  History: Phillip [name] was seen for an audiological evaluation. He states that he was diagnosed with "tonsil cancer" 6 years ago. In the process of treatment he lost a significant amount of hearing and began wearing bilateral hearing aids.  His primary complaint is a "lack of word clarity".  He also noticed a low level humm, upon which words are heard, that creates distortion or difficulty hearing.  He notes that although the hearing aids make sounds louder, they do not improve the clarity of words.   Evaluation: Conventional pure tone audiometry from 250Hz  - 8000Hz  with using insert earphones.    Hearing Thresholds are 40 dBHL at 250Hz  bilaterally; 55-65 dBHL at 500Hz ;: 55-60 dBHL at 1000Hz ; 55-70 dBHL at 2000Hz ; 70-80dBHL at 4000Hz  and 75-80 dBHL at 8000Hz .  The low frequency hearing is slightly poorer on the right side and the high frequency hearing thresholds are poorer on the left side.  The hearing loss is primarily sensorineural. Reliability is good Speech detection thresholds using recorded multitalker noise was used because Phillip [name] had difficulty identifying words near thresholds.  SDT thresholds are 45 DBHL on the right and 50 dBHL on the left. Word recognition (at very loud volumes) using recorded NU-6 word lists at 85 dBHL, in quiet.  Right ear: 64%.  Left ear:   44% Word recognition in minimal background noise using PBK words lists:  +5 dBHL  Right ear: 20%                              Left ear:  20%   CONCLUSION:      Phillip [name] has a mild to moderately-severe  sloping to severe high frequency hearing loss bilaterally that is primarily sensorineural. He has poor word  recognition in quiet (64% on the right and 44% on the left) at very loud levels that becomes very poor in minimal background noise (20% correct in each ear).  Although Phillip [name] communicates well one-on-one and has good lip-reading skills, he has difficulty communicating in a variety of social situations and is seeking hearing options.  Phillip [name] is interested in exploring the latest technology regarding his hearing, including implantable devices (i.e. Hybrid cochlear or Cochlea implant) before pursuing a new set of traditional hearing aids.  Referral for a cochlear implant evaluation is recommended because of his very poor word recognition is adversely affecting his ability to communicate with others..  Whether Phillip [name] is an implant candidate or not, at the very least he will need a new hearing aid evaluation.    RECOMMENDATIONS: 1.   Referral for a cochlear implant evaluation (Locally Dr. Vicie Mutters, ENT and [facility] may be resources). 2.   Compare current hearing results with previous results - a medical release has been signed for records to be obtained from Dr. Radene Journey, ENT and [facility]. 3.   Monitor hearing closely with a repeat audiological evaluation in 3-6 months - hopefully to be in conjunction with a cochlear or hearing aid evaluation (earlier if there is any change in hearing.   4.   Strategies that help improve hearing include: A) Face the  speaker directly. Optimal is having the speakers face well - lit.  Unless amplified, being within 3-6 feet of the speaker will enhance word recognition. B) Avoid having the speaker back-lit as this will minimize the ability to use cues from lip-reading, facial expression and gestures. C)  Word recognition is poorer in background noise. For optimal word recognition, turn off the TV, radio or noisy fan when engaging in conversation. In a restaurant, try to sit away from noise sources and close to the primary speaker.  D)  Ask for topic  clarification from time to time in order to remain in the conversation.  Most people don't mind repeating or clarifying a point when asked.  If needed, explain the difficulty hearing in background noise or hearing loss. 5.   Use hearing protection during noisy activities such as using a weed eater, moving the lawn, shooting, etc.    Musician's plugs,           are available from [employer] for music related hearing protection because there is no distortion.  Other hearing protection,            such as sponge plugs (available at pharmacies) or earmuffs (available at sporting goods stores or department stores such as            Paediatric nurse) are useful for noisy activities and venues.  Deborah L. Heide Spark, Au.D., CCC-A Doctor of Audiology 12/16/2015   cc: Ann Held, DO

## 2015-12-24 ENCOUNTER — Telehealth: Payer: Self-pay | Admitting: Family Medicine

## 2015-12-24 DIAGNOSIS — H9193 Unspecified hearing loss, bilateral: Secondary | ICD-10-CM

## 2015-12-24 NOTE — Telephone Encounter (Signed)
Message left to call to office to make the patient aware of the below recommendation.    KP

## 2015-12-24 NOTE — Telephone Encounter (Signed)
He should be able to schdule Call if there is a problem

## 2015-12-24 NOTE — Telephone Encounter (Signed)
Caller name: Shadie Relationship to patient: self Can be reached: [phone]  Reason for call: pt called to update address and phone #. They are in an apt in [place] for 6 months while building home. Pt states he went to audiologist we referred him to and they recommended a specialist in [place] and stated they were forwarding their results and recommendations to Dr. Carollee Herter. Pt asking if he needs a referral to the [facility] doctor or if he can just call to schedule. Please advise.

## 2015-12-24 NOTE — Telephone Encounter (Signed)
Please advise      KP 

## 2015-12-27 NOTE — Addendum Note (Signed)
Addended by: Ewing Schlein on: 12/27/2015 03:15 PM   Modules accepted: Orders

## 2015-12-27 NOTE — Telephone Encounter (Signed)
Patient walked in with the information from his audiology visit and he stated he needed to see an ENT for a cochlear implant. Ref has been placed.     KP

## 2016-01-05 ENCOUNTER — Telehealth: Payer: Self-pay

## 2016-01-05 NOTE — Telephone Encounter (Signed)
Patient dropped off medical clearance form for hiking program

## 2016-01-21 NOTE — Telephone Encounter (Signed)
Form faxed, copy sent for scanning. JG//CMA

## 2016-01-25 NOTE — Telephone Encounter (Signed)
Pt request copy of form be mailed to him as well. I did advise it was faxed in. Verified address:  [place] [address]

## 2016-01-28 NOTE — Telephone Encounter (Signed)
Copy of form mailed to pt's home address as listed below. JG//CMA

## 2016-02-03 ENCOUNTER — Encounter: Payer: Self-pay | Admitting: Family Medicine

## 2016-02-03 ENCOUNTER — Ambulatory Visit (INDEPENDENT_AMBULATORY_CARE_PROVIDER_SITE_OTHER): Payer: PPO | Admitting: Family Medicine

## 2016-02-03 VITALS — BP 138/82 | HR 68 | Temp 97.7°F | Ht 72.5 in | Wt 213.0 lb

## 2016-02-03 DIAGNOSIS — E039 Hypothyroidism, unspecified: Secondary | ICD-10-CM | POA: Diagnosis not present

## 2016-02-03 DIAGNOSIS — E781 Pure hyperglyceridemia: Secondary | ICD-10-CM | POA: Diagnosis not present

## 2016-02-03 DIAGNOSIS — I2510 Atherosclerotic heart disease of native coronary artery without angina pectoris: Secondary | ICD-10-CM | POA: Diagnosis not present

## 2016-02-03 DIAGNOSIS — H9193 Unspecified hearing loss, bilateral: Secondary | ICD-10-CM

## 2016-02-03 DIAGNOSIS — H903 Sensorineural hearing loss, bilateral: Secondary | ICD-10-CM | POA: Insufficient documentation

## 2016-02-03 DIAGNOSIS — I10 Essential (primary) hypertension: Secondary | ICD-10-CM

## 2016-02-03 DIAGNOSIS — H9190 Unspecified hearing loss, unspecified ear: Secondary | ICD-10-CM | POA: Insufficient documentation

## 2016-02-03 NOTE — Progress Notes (Signed)
Pre visit review using our clinic review tool, if applicable. No additional management support is needed unless otherwise documented below in the visit note. 

## 2016-02-03 NOTE — Assessment & Plan Note (Signed)
Wears hearing aides. Has been referred to ENT to discuss possible cochlear implants

## 2016-02-03 NOTE — Assessment & Plan Note (Signed)
Chronic, endorses some elevated readings recently. Will start monitoring more closely and notify me if persistently elevated.

## 2016-02-03 NOTE — Assessment & Plan Note (Signed)
Stable off meds. ?

## 2016-02-03 NOTE — Assessment & Plan Note (Signed)
Nonobstructive - continue aspirin.

## 2016-02-03 NOTE — Patient Instructions (Addendum)
Start checking blood pressures regularly at least a few times a week (some in morning, some in night).  Continue current medications.  Nice to meet you today, call us with questions.  Return in 6 months for medicare wellness visit.

## 2016-02-03 NOTE — Progress Notes (Signed)
BP 138/82 mmHg  Pulse 68  Temp(Src) 97.7 F (36.5 C) (Oral)  Ht 6' 0.5" (1.842 m)  Wt 213 lb (96.616 kg)  BMI 28.48 kg/m2   CC: transfer care from Dr Etter Sjogren  Subjective:    Patient ID: Phillip [name], male    DOB: 1941/07/28, 75 y.o.   MRN: [id number]  HPI: Phillip [name] is a 75 y.o. male presenting on 02/03/2016 for Establish Care   Prior saw Dr Etter Sjogren, requested transfer as he now lives in [place]. He has previously seen Dr Linda Hedges and Dr Glori Bickers. Involved in [employer]. He is a runner - has run [employer] and [employer]. S/p torn meniscus. Continues golfing and hiking.   Nonobstructive CAD - was involved in crestor study.   HTN - compliant on lisinopril 89m daily. Endorses some elevated readings 1951-884Zsystolic that quickly come down with rest. No HA, vision changes, CP/tightness, SOB, leg swelling.   Dyslipidemia - on fenofibrate 1656mdaily. Mainly for fenofibrate.   H/o tonsillar cancer - s/p radiation and chemotherapy 2011. ENT Dr NeLucia Gaskins release from his care. Was on PEG tube. Residual xerostomia and loss of taste and some limitations to food intake - trouble eating breads. Continues drinking ensure plus 8 cans daily.   Insomnia due to frequent nocturia - longterm rare sonata use + melatonin.   Hearing loss - possibly after effect of chemotherapy (cisplatin). Saw audiologist 11/2015 - recommended ENT referral for cochlear implants.   Preventative: Last CPE 10/2015  Relevant past medical, surgical, family and social history reviewed and updated as indicated. Interim medical history since our last visit reviewed. Allergies and medications reviewed and updated. Current Outpatient Prescriptions on File Prior to Visit  Medication Sig  . aspirin 81 MG tablet Take 81 mg by mouth daily.    . Marland Kitchenocusate sodium (STOOL SOFTENER) 250 MG capsule Take 250 mg by mouth daily.  . Marland KitchenNSURE PLUS (ENSURE PLUS) LIQD 1 can 8 x a day  . fenofibrate 160 MG tablet TAKE ONE  TABLET BY MOUTH ONCE DAILY  . Flaxseed, Linseed, (FLAX SEEDS PO) Take by mouth daily.  . Marland Kitchenbuprofen (ADVIL,MOTRIN) 200 MG tablet Take 200 mg by mouth as needed.    . Marland Kitchenisinopril (PRINIVIL,ZESTRIL) 40 MG tablet Take 1 tablet (40 mg total) by mouth daily.  . Magnesium 200 MG TABS Take by mouth daily.  . Melatonin 3 MG CAPS Take 1 capsule by mouth at bedtime as needed.   . Multiple Vitamin (MULTIVITAMIN) tablet Take 1 tablet by mouth daily.  . SF 5000 PLUS 1.1 % CREA dental cream   . vitamin C (ASCORBIC ACID) 500 MG tablet Take 500 mg by mouth daily.  . vitamin E 400 UNIT capsule Take 400 Units by mouth daily.  . zaleplon (SONATA) 10 MG capsule Take 1 capsule (10 mg total) by mouth as needed for sleep.  . fluticasone (FLONASE) 50 MCG/ACT nasal spray Reported on 02/03/2016   No current facility-administered medications on file prior to visit.    Review of Systems Per HPI unless specifically indicated in ROS section     Objective:    BP 138/82 mmHg  Pulse 68  Temp(Src) 97.7 F (36.5 C) (Oral)  Ht 6' 0.5" (1.842 m)  Wt 213 lb (96.616 kg)  BMI 28.48 kg/m2  Wt Readings from Last 3 Encounters:  02/03/16 213 lb (96.616 kg)  10/21/15 214 lb (97.07 kg)  08/20/15 213 lb (96.616 kg)    Physical Exam  Constitutional: He appears well-developed  and well-nourished. No distress.  HENT:  Head: Normocephalic and atraumatic.  Mouth/Throat: Oropharynx is clear and moist. No oropharyngeal exudate.  Eyes: Conjunctivae and EOM are normal. Pupils are equal, round, and reactive to light. No scleral icterus.  Neck: No thyromegaly present.  Woody tightness R neck  Cardiovascular: Normal rate, regular rhythm, normal heart sounds and intact distal pulses.   No murmur heard. Pulmonary/Chest: Effort normal and breath sounds normal. No respiratory distress. He has no wheezes. He has no rales.  Musculoskeletal: He exhibits no edema.  Lymphadenopathy:    He has no cervical adenopathy.  Skin: Skin is warm and  dry. No rash noted.  Psychiatric: He has a normal mood and affect.  Passionate about beliefs  Nursing note and vitals reviewed.  Results for orders placed or performed in visit on 10/21/15  Comp Met (CMET)  Result Value Ref Range   Sodium 140 135 - 145 mEq/L   Potassium 4.5 3.5 - 5.1 mEq/L   Chloride 105 96 - 112 mEq/L   CO2 30 19 - 32 mEq/L   Glucose, Bld 114 (H) 70 - 99 mg/dL   BUN 19 6 - 23 mg/dL   Creatinine, Ser 1.08 0.40 - 1.50 mg/dL   Total Bilirubin 0.7 0.2 - 1.2 mg/dL   Alkaline Phosphatase 39 39 - 117 U/L   AST 22 0 - 37 U/L   ALT 20 0 - 53 U/L   Total Protein 6.8 6.0 - 8.3 g/dL   Albumin 4.4 3.5 - 5.2 g/dL   Calcium 9.8 8.4 - 10.5 mg/dL   GFR 70.64 >60.00 mL/min  CBC with Differential/Platelet  Result Value Ref Range   WBC 4.9 4.0 - 10.5 K/uL   RBC 4.89 4.22 - 5.81 Mil/uL   Hemoglobin 16.0 13.0 - 17.0 g/dL   HCT 47.0 39.0 - 52.0 %   MCV 96.1 78.0 - 100.0 fl   MCHC 34.1 30.0 - 36.0 g/dL   RDW 13.0 11.5 - 15.5 %   Platelets 156.0 150.0 - 400.0 K/uL   Neutrophils Relative % 77.5 (H) 43.0 - 77.0 %   Lymphocytes Relative 11.2 (L) 12.0 - 46.0 %   Monocytes Relative 10.0 3.0 - 12.0 %   Eosinophils Relative 1.3 0.0 - 5.0 %   Basophils Relative 0.0 0.0 - 3.0 %   Neutro Abs 3.8 1.4 - 7.7 K/uL   Lymphs Abs 0.5 (L) 0.7 - 4.0 K/uL   Monocytes Absolute 0.5 0.1 - 1.0 K/uL   Eosinophils Absolute 0.1 0.0 - 0.7 K/uL   Basophils Absolute 0.0 0.0 - 0.1 K/uL  Lipid panel  Result Value Ref Range   Cholesterol 130 0 - 200 mg/dL   Triglycerides 101.0 0.0 - 149.0 mg/dL   HDL 35.20 (L) >39.00 mg/dL   VLDL 20.2 0.0 - 40.0 mg/dL   LDL Cholesterol 75 0 - 99 mg/dL   Total CHOL/HDL Ratio 4    NonHDL 95.05   PSA  Result Value Ref Range   PSA 0.33 0.10 - 4.00 ng/mL      Assessment & Plan:  Over 25 minutes were spent face-to-face with the patient during this encounter and >50% of that time was spent on counseling and coordination of care  Problem List Items Addressed This Visit      Essential hypertension - Primary    Chronic, endorses some elevated readings recently. Will start monitoring more closely and notify me if persistently elevated.       CAD (coronary artery disease)  Nonobstructive - continue aspirin.       Hypothyroid    Stable off meds.       Hypertriglyceridemia    Continue fenofibrate.       Hearing loss    Wears hearing aides. Has been referred to ENT to discuss possible cochlear implants          Follow up plan: Return in about 5 months (around 07/05/2016), or as needed, for medicare wellness visit.  [name], MD

## 2016-02-03 NOTE — Assessment & Plan Note (Signed)
Continue fenofibrate 

## 2016-02-21 DIAGNOSIS — H903 Sensorineural hearing loss, bilateral: Secondary | ICD-10-CM | POA: Diagnosis not present

## 2016-02-28 ENCOUNTER — Other Ambulatory Visit: Payer: Self-pay

## 2016-02-28 DIAGNOSIS — G4700 Insomnia, unspecified: Secondary | ICD-10-CM

## 2016-02-28 NOTE — Telephone Encounter (Signed)
Patient transferred care. Please advise    KP

## 2016-02-29 MED ORDER — ZALEPLON 10 MG PO CAPS: 10.0000 mg | ORAL_CAPSULE | ORAL | Status: DC | PRN

## 2016-02-29 NOTE — Telephone Encounter (Signed)
plz phone in. 

## 2016-02-29 NOTE — Telephone Encounter (Signed)
Left refill on voice mail at pharmacy  

## 2016-03-23 DIAGNOSIS — J029 Acute pharyngitis, unspecified: Secondary | ICD-10-CM | POA: Diagnosis not present

## 2016-04-04 DIAGNOSIS — L821 Other seborrheic keratosis: Secondary | ICD-10-CM | POA: Diagnosis not present

## 2016-04-04 DIAGNOSIS — D1801 Hemangioma of skin and subcutaneous tissue: Secondary | ICD-10-CM | POA: Diagnosis not present

## 2016-04-04 DIAGNOSIS — L570 Actinic keratosis: Secondary | ICD-10-CM | POA: Diagnosis not present

## 2016-04-04 DIAGNOSIS — D225 Melanocytic nevi of trunk: Secondary | ICD-10-CM | POA: Diagnosis not present

## 2016-04-04 DIAGNOSIS — L814 Other melanin hyperpigmentation: Secondary | ICD-10-CM | POA: Diagnosis not present

## 2016-04-04 DIAGNOSIS — L905 Scar conditions and fibrosis of skin: Secondary | ICD-10-CM | POA: Diagnosis not present

## 2016-05-22 ENCOUNTER — Other Ambulatory Visit: Payer: Self-pay | Admitting: Family Medicine

## 2016-05-29 ENCOUNTER — Other Ambulatory Visit: Payer: Self-pay

## 2016-05-29 ENCOUNTER — Ambulatory Visit (INDEPENDENT_AMBULATORY_CARE_PROVIDER_SITE_OTHER): Payer: PPO

## 2016-05-29 VITALS — BP 142/88 | HR 70 | Temp 97.5°F | Ht 72.5 in | Wt 218.2 lb

## 2016-05-29 DIAGNOSIS — Z23 Encounter for immunization: Secondary | ICD-10-CM | POA: Diagnosis not present

## 2016-05-29 DIAGNOSIS — Z0000 Encounter for general adult medical examination without abnormal findings: Secondary | ICD-10-CM

## 2016-05-29 MED ORDER — FENOFIBRATE 160 MG PO TABS
160.0000 mg | ORAL_TABLET | Freq: Every day | ORAL | 2 refills | Status: DC
Start: 2016-05-29 — End: 2017-03-16

## 2016-05-29 NOTE — Telephone Encounter (Signed)
Pt was here today for AWV. Pt has requested refill of Fenofibrate 160 mg. Pt has requested a 90 day supply of medication. Confirmed pharmacy is [facility] on [address], [place], [place].

## 2016-05-29 NOTE — Progress Notes (Signed)
PCP notes:   Health maintenance:  Flu vaccine - administered  Abnormal screenings:   None  Patient concerns:   Pt has requested refill of Fenofibrate. PCP sent request in separate communication.  Nurse concerns:  BP was elevated. PCP was notified. advised to continue with home monitoring and recording of BP in log. Pt has been scheduled a future appt with PCP.   Next PCP appt:   06/05/16 @ 1015

## 2016-05-29 NOTE — Progress Notes (Signed)
Pre visit review using our clinic review tool, if applicable. No additional management support is needed unless otherwise documented below in the visit note. 

## 2016-05-29 NOTE — Progress Notes (Signed)
Subjective:   Phillip [name] is a 75 y.o. male who presents for Medicare Annual/Subsequent preventive examination.  Review of Systems:  N/A Cardiac Risk Factors include: advanced age (>19men, >41 women);male gender;Other (see comment);hypertension     Objective:    Vitals: BP (!) 142/88 (BP Location: Right Arm, Patient Position: Sitting, Cuff Size: Normal)   Pulse 70   Temp 97.5 F (36.4 C) (Oral)   Ht 6' 0.5" (1.842 m)   Wt 218 lb 4 oz (99 kg)   SpO2 98%   BMI 29.19 kg/m   Body mass index is 29.19 kg/m.  Tobacco History  Smoking Status  . Never Smoker  Smokeless Tobacco  . Never Used     Counseling given: No   Past Medical History:  Diagnosis Date  . Anxiety   . Coronary artery disease 2003   nonbstructive  . Diarrhea   . Hearing loss   . History of benign prostatic hypertrophy   . History of radiation therapy 01/31/10-03/22/10   right tonsil/right neck node 7000 cGy 35 sessions, high risk lymph node volume 5940 cGy 35 sessions, low risk lymph node vol 5600 cGy 35 sessions  . 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 HTN (hypertension)   . Hyperlipidemia    diet controlled- taking medication d/t ensure drinking  . Hypothyroid 01/16/2013  . Neck pain   . Sinus bradycardia   . Squamous cell carcinoma    right tonsil- 2011  . Thrombocytopenia ([place]) 02/02/2012   unclear etiology  . Tinnitus    Past Surgical History:  Procedure Laterality Date  . CARDIAC CATHETERIZATION  2003, 2005   40% blockage, two 30% blockages treated medically [facility])  . COLONOSCOPY    . DENTAL SURGERY     extractions  . KNEE ARTHROSCOPY Left   . PEG PLACEMENT  02/2010  . TONSILLECTOMY    . VASECTOMY     Family History  Problem Relation Age of Onset  . Coronary artery disease Father   . Heart attack Father   . Heart disease Father   . Colon cancer Neg Hx   . Esophageal cancer Neg Hx   . Rectal cancer Neg Hx   . Stomach cancer Neg Hx    History  Sexual Activity  . Sexual activity: Yes     Outpatient Encounter Prescriptions as of 05/29/2016  Medication Sig  . aspirin 81 MG tablet Take 81 mg by mouth daily.    . cholecalciferol (VITAMIN D) 1000 units tablet Take 1,000 Units by mouth daily.  Marland Kitchen docusate sodium (STOOL SOFTENER) 250 MG capsule Take 250 mg by mouth daily.  Marland Kitchen ENSURE PLUS (ENSURE PLUS) LIQD 1 can 8 x a day  . fenofibrate 160 MG tablet Take 1 tablet (160 mg total) by mouth daily. Repeat labs are due now  . Flaxseed, Linseed, (FLAX SEEDS PO) Take by mouth daily.  . fluticasone (FLONASE) 50 MCG/ACT nasal spray Reported on 02/03/2016  . ibuprofen (ADVIL,MOTRIN) 200 MG tablet Take 200 mg by mouth as needed.    Marland Kitchen lisinopril (PRINIVIL,ZESTRIL) 40 MG tablet Take 1 tablet (40 mg total) by mouth daily.  . Magnesium 200 MG TABS Take by mouth daily.  . Melatonin 3 MG CAPS Take 1 capsule by mouth at bedtime as needed.   . Multiple Vitamin (MULTIVITAMIN) tablet Take 1 tablet by mouth daily.  . SF 5000 PLUS 1.1 % CREA dental cream   . vitamin C (ASCORBIC ACID) 500 MG tablet Take 500 mg by  mouth daily.  . vitamin E 400 UNIT capsule Take 400 Units by mouth daily.  . zaleplon (SONATA) 10 MG capsule Take 1 capsule (10 mg total) by mouth as needed for sleep.   No facility-administered encounter medications on file as of 05/29/2016.     Activities of Daily Living In your present state of health, do you have any difficulty performing the following activities: 05/29/2016 10/21/2015  Hearing? Tempie Donning  Vision? N N  Difficulty concentrating or making decisions? N N  Walking or climbing stairs? N N  Dressing or bathing? N N  Doing errands, shopping? N N  Preparing Food and eating ? N -  Using the Toilet? N -  In the past six months, have you accidently leaked urine? N -  Do you have problems with loss of bowel control? N -  Managing your Medications? N -  Managing your Finances? N -  Housekeeping or managing your Housekeeping? N -  Some recent data might be hidden    Patient Care  Team: [name], MD as PCP - General (Family Medicine) Rozetta Nunnery, MD Arloa Koh, MD (Radiation Oncology) Heath Lark, MD as Consulting Physician (Hematology and Oncology) Arlys John Lauriers, OD as Consulting Physician (Optometry)   Assessment:     Visual Acuity Screening   Right eye Left eye Both eyes  Without correction:     With correction: 20/40 20/25 20/20   Hearing Screening Comments: Patient wears hearing aids in both ears   Exercise Activities and Dietary recommendations Current Exercise Habits: Home exercise routine, Type of exercise: walking;strength training/weights, Time (Minutes): 30, Frequency (Times/Week): 5, Weekly Exercise (Minutes/Week): 150, Intensity: Moderate, Exercise limited by: None identified  Goals    . Increase physical activity          Starting 05/29/2016, I will continue golfing, walking and weight machines for 3-5 days per week for at least 30 minutes per day.       Fall Risk Fall Risk  05/29/2016 10/21/2015 10/13/2014 08/28/2014  Falls in the past year? No No No No   Depression Screen PHQ 2/9 Scores 05/29/2016 10/21/2015 10/13/2014 08/28/2014  PHQ - 2 Score 0 0 0 0    Cognitive Testing MMSE - Mini Mental State Exam 05/29/2016  Orientation to time 5  Orientation to Place 5  Registration 3  Attention/ Calculation 0  Recall 3  Language- name 2 objects 0  Language- repeat 1  Language- follow 3 step command 3  Language- read & follow direction 0  Write a sentence 0  Copy design 0  Total score 20   PLEASE NOTE: A Mini-Cog screen was completed. Maximum score is 20. A value of 0 denotes this part of Folstein MMSE was not completed or the patient failed this part of the Mini-Cog screening.   Mini-Cog Screening Orientation to Time - Max 5 pts Orientation to Place - Max 5 pts Registration - Max 3 pts Recall - Max 3 pts Language Repeat - Max 1 pts Language Follow 3 Step Command - Max 3 pts  Immunization History  Administered Date(s)  Administered  . H1N1 07/21/2008  . Influenza Split 06/20/2012  . Influenza Whole 07/02/2006, 05/21/2008, 05/21/2009  . Influenza,inj,Quad PF,36+ Mos 05/25/2014, 05/29/2016  . Influenza-Unspecified 05/21/2013, 06/06/2015  . Pneumococcal Conjugate-13 11/05/2013, 05/25/2014  . Pneumococcal Polysaccharide-23 07/02/2006  . Td 09/14/2008   Screening Tests Health Maintenance  Topic Date Due  . DTaP/Tdap/Td (1 - Tdap) 09/14/2018 (Originally 09/15/2008)  . COLONOSCOPY  04/07/2018  . TETANUS/TDAP  09/14/2018  . INFLUENZA VACCINE  Completed  . ZOSTAVAX  Addressed  . PNA vac Low Risk Adult  Completed      Plan:     I have personally reviewed and addressed the Medicare Annual Wellness questionnaire and have noted the following in the patient's chart:  A. Medical and social history B. Use of alcohol, tobacco or illicit drugs  C. Current medications and supplements D. Functional ability and status E.  Nutritional status F.  Physical activity G. Advance directives H. List of other physicians I.  Hospitalizations, surgeries, and ER visits in previous 12 months J.  [place] to include hearing, vision, cognitive, depression L. Referrals and appointments - none  In addition, I have reviewed and discussed with patient certain preventive protocols, quality metrics, and best practice recommendations. A written personalized care plan for preventive services as well as general preventive health recommendations were provided to patient.  See attached scanned questionnaire for additional information.   Signed,   Lindell Noe, MHA, BS, LPN Health Advisor

## 2016-05-29 NOTE — Patient Instructions (Signed)
Mr. Finkler , Thank you for taking time to come for your Medicare Wellness Visit. I appreciate your ongoing commitment to your health goals. Please review the following plan we discussed and let me know if I can assist you in the future.   These are the goals we discussed: Goals    . Increase physical activity          Starting 05/29/2016, I will continue golfing, walking and weight machines for 3-5 days per week for at least 30 minutes per day.        This is a list of the screening recommended for you and due dates:  Health Maintenance  Topic Date Due  . DTaP/Tdap/Td vaccine (1 - Tdap) 09/14/2018*  . Colon Cancer Screening  04/07/2018  . Tetanus Vaccine  09/14/2018  . Flu Shot  Completed  . Shingles Vaccine  Addressed  . Pneumonia vaccines  Completed  *Topic was postponed. The date shown is not the original due date.   Preventive Care for Adults  A healthy lifestyle and preventive care can promote health and wellness. Preventive health guidelines for adults include the following key practices.  . A routine yearly physical is a good way to check with your health care provider about your health and preventive screening. It is a chance to share any concerns and updates on your health and to receive a thorough exam.  . Visit your dentist for a routine exam and preventive care every 6 months. Brush your teeth twice a day and floss once a day. Good oral hygiene prevents tooth decay and gum disease.  . The frequency of eye exams is based on your age, health, family medical history, use  of contact lenses, and other factors. Follow your health care provider's ecommendations for frequency of eye exams.  . Eat a healthy diet. Foods like vegetables, fruits, whole grains, low-fat dairy products, and lean protein foods contain the nutrients you need without too many calories. Decrease your intake of foods high in solid fats, added sugars, and salt. Eat the right amount of calories for you. Get  information about a proper diet from your health care provider, if necessary.  . Regular physical exercise is one of the most important things you can do for your health. Most adults should get at least 150 minutes of moderate-intensity exercise (any activity that increases your heart rate and causes you to sweat) each week. In addition, most adults need muscle-strengthening exercises on 2 or more days a week.  Silver Sneakers may be a benefit available to you. To determine eligibility, you may visit the website: www.silversneakers.com or contact program at (201) 845-2788 Mon-Fri between 8AM-8PM.   . Maintain a healthy weight. The body mass index (BMI) is a screening tool to identify possible weight problems. It provides an estimate of body fat based on height and weight. Your health care provider can find your BMI and can help you achieve or maintain a healthy weight.   For adults 20 years and older: ? A BMI below 18.5 is considered underweight. ? A BMI of 18.5 to 24.9 is normal. ? A BMI of 25 to 29.9 is considered overweight. ? A BMI of 30 and above is considered obese.   . Maintain normal blood lipids and cholesterol levels by exercising and minimizing your intake of saturated fat. Eat a balanced diet with plenty of fruit and vegetables. Blood tests for lipids and cholesterol should begin at age 38 and be repeated every 5 years. If your  lipid or cholesterol levels are high, you are over 50, or you are at high risk for heart disease, you may need your cholesterol levels checked more frequently. Ongoing high lipid and cholesterol levels should be treated with medicines if diet and exercise are not working.  . If you smoke, find out from your health care provider how to quit. If you do not use tobacco, please do not start.  . If you choose to drink alcohol, please do not consume more than 2 drinks per day. One drink is considered to be 12 ounces (355 mL) of beer, 5 ounces (148 mL) of wine, or 1.5  ounces (44 mL) of liquor.  . If you are 74-75 years old, ask your health care provider if you should take aspirin to prevent strokes.  . Use sunscreen. Apply sunscreen liberally and repeatedly throughout the day. You should seek shade when your shadow is shorter than you. Protect yourself by wearing long sleeves, pants, a wide-brimmed hat, and sunglasses year round, whenever you are outdoors.  . Once a month, do a whole body skin exam, using a mirror to look at the skin on your back. Tell your health care provider of new moles, moles that have irregular borders, moles that are larger than a pencil eraser, or moles that have changed in shape or color.

## 2016-05-30 NOTE — Progress Notes (Signed)
I reviewed health advisor's note, was available for consultation, and agree with documentation and plan.  

## 2016-06-05 ENCOUNTER — Ambulatory Visit (INDEPENDENT_AMBULATORY_CARE_PROVIDER_SITE_OTHER): Payer: PPO | Admitting: Family Medicine

## 2016-06-05 ENCOUNTER — Encounter: Payer: Self-pay | Admitting: Family Medicine

## 2016-06-05 VITALS — BP 160/82 | HR 70 | Temp 98.1°F | Wt 216.0 lb

## 2016-06-05 DIAGNOSIS — I10 Essential (primary) hypertension: Secondary | ICD-10-CM | POA: Diagnosis not present

## 2016-06-05 NOTE — Progress Notes (Addendum)
BP (!) 160/82 (BP Location: Right Arm, Cuff Size: Large)   Pulse 70   Temp 98.1 F (36.7 C) (Oral)   Wt 216 lb (98 kg)   BMI 28.89 kg/m    CC: recheck blood pressure Subjective:    Patient ID: Phillip [name], male    DOB: [birth year], 75 y.o.   MRN: [id number]  HPI: Phillip [name] is a 75 y.o. male presenting on 06/05/2016 for Follow-up (on blood pressure)   Saw Lesia last week for medicare wellness visit, at that time BP was 142/88.   BP today 118/68, but using home wrist cuff bp was 143/83.  Stopped ibuprofen. Takes aspirin 81mg  daily, with extra PRN joint pains.   HTN - Compliant with current antihypertensive regimen of lisinopril 40mg  daily. Does check blood pressures at home and brings log. No low blood pressure readings or symptoms of dizziness/syncope. Denies HA, vision changes, CP/tightness, SOB, leg swelling.   Relevant past medical, surgical, family and social history reviewed and updated as indicated. Interim medical history since our last visit reviewed. Allergies and medications reviewed and updated. Current Outpatient Prescriptions on File Prior to Visit  Medication Sig  . aspirin 81 MG tablet Take 81 mg by mouth daily.    . cholecalciferol (VITAMIN D) 1000 units tablet Take 1,000 Units by mouth daily.  Marland Kitchen docusate sodium (STOOL SOFTENER) 250 MG capsule Take 250 mg by mouth daily.  Marland Kitchen ENSURE PLUS (ENSURE PLUS) LIQD 1 can 8 x a day  . fenofibrate 160 MG tablet Take 1 tablet (160 mg total) by mouth daily. Repeat labs are due now  . Flaxseed, Linseed, (FLAX SEEDS PO) Take by mouth daily.  . fluticasone (FLONASE) 50 MCG/ACT nasal spray Reported on 02/03/2016  . lisinopril (PRINIVIL,ZESTRIL) 40 MG tablet Take 1 tablet (40 mg total) by mouth daily.  . Magnesium 200 MG TABS Take by mouth daily.  . Melatonin 3 MG CAPS Take 1 capsule by mouth at bedtime as needed.   . Multiple Vitamin (MULTIVITAMIN) tablet Take 1 tablet by mouth daily.  . SF 5000 PLUS 1.1 % CREA dental cream   .  vitamin C (ASCORBIC ACID) 500 MG tablet Take 500 mg by mouth daily.  . vitamin E 400 UNIT capsule Take 400 Units by mouth daily.  . zaleplon (SONATA) 10 MG capsule Take 1 capsule (10 mg total) by mouth as needed for sleep.   No current facility-administered medications on file prior to visit.     Review of Systems Per HPI unless specifically indicated in ROS section     Objective:    BP (!) 160/82 (BP Location: Right Arm, Cuff Size: Large)   Pulse 70   Temp 98.1 F (36.7 C) (Oral)   Wt 216 lb (98 kg)   BMI 28.89 kg/m   Wt Readings from Last 3 Encounters:  06/05/16 216 lb (98 kg)  05/29/16 218 lb 4 oz (99 kg)  02/03/16 213 lb (96.6 kg)    Physical Exam  Constitutional: He appears well-developed and well-nourished. No distress.  HENT:  Head: Normocephalic and atraumatic.  Mouth/Throat: Oropharynx is clear and moist. No oropharyngeal exudate.  Eyes: Conjunctivae and EOM are normal. Pupils are equal, round, and reactive to light.  Cardiovascular: Normal rate, regular rhythm and intact distal pulses.   Murmur (2/6 systolic) heard. Pulmonary/Chest: Effort normal and breath sounds normal. No respiratory distress. He has no wheezes. He has no rales.  Musculoskeletal: He exhibits no edema.  Skin: Skin is warm and dry. No  rash noted.  Psychiatric: He has a normal mood and affect. His speech is normal. Cognition and memory are normal.  Easily agitated  Nursing note and vitals reviewed.     Assessment & Plan:  Over 25 minutes were spent face-to-face with the patient during this encounter and >50% of that time was spent on counseling and coordination of care  Problem List Items Addressed This Visit    Essential hypertension - Primary    Chronic, largely fluctuating readings by log he brings today. Pt endorses longstanding easily agitated and irritated which will affect his blood pressures.  Wrist cuff not concordant with office manual readings - recommended he buy automatic arm cuff  instead, and monitor BP 1-2 times a day, discussed optimal timing of bp checks.  Drop off log in 1 month to ensure blood pressure stable on current regimen. No changes made today. Pt agrees with plan.       Other Visit Diagnoses   None.      Follow up plan: Return - keep scheduled f/u.  [name], MD

## 2016-06-05 NOTE — Assessment & Plan Note (Addendum)
Chronic, largely fluctuating readings by log he brings today. Pt endorses longstanding easily agitated and irritated which will affect his blood pressures.  Wrist cuff not concordant with office manual readings - recommended he buy automatic arm cuff instead, and monitor BP 1-2 times a day, discussed optimal timing of bp checks.  Drop off log in 1 month to ensure blood pressure stable on current regimen. No changes made today. Pt agrees with plan.

## 2016-06-05 NOTE — Progress Notes (Signed)
Pre visit review using our clinic review tool, if applicable. No additional management support is needed unless otherwise documented below in the visit note. 

## 2016-06-05 NOTE — Patient Instructions (Signed)
Buy new arm blood pressure cuff.  Keep track of blood pressures once to twice daily - check 30 min after any food, drink, exercise, stressor, anger. If you'd like, drop off readings in 1 month.  No changes today. Nice to see you today, call us with questions. Keep follow up appointment.

## 2016-07-12 ENCOUNTER — Other Ambulatory Visit: Payer: Self-pay | Admitting: Family Medicine

## 2016-07-12 DIAGNOSIS — I10 Essential (primary) hypertension: Secondary | ICD-10-CM

## 2016-08-02 ENCOUNTER — Ambulatory Visit: Payer: PPO | Admitting: *Deleted

## 2016-08-02 ENCOUNTER — Other Ambulatory Visit: Payer: Self-pay | Admitting: Family Medicine

## 2016-08-02 VITALS — BP 115/70 | HR 60

## 2016-08-02 DIAGNOSIS — I10 Essential (primary) hypertension: Secondary | ICD-10-CM

## 2016-08-10 ENCOUNTER — Ambulatory Visit (INDEPENDENT_AMBULATORY_CARE_PROVIDER_SITE_OTHER): Payer: PPO | Admitting: Family Medicine

## 2016-08-10 ENCOUNTER — Encounter: Payer: Self-pay | Admitting: Family Medicine

## 2016-08-10 DIAGNOSIS — I10 Essential (primary) hypertension: Secondary | ICD-10-CM

## 2016-08-10 NOTE — Progress Notes (Addendum)
BP 138/72 (BP Location: Left Arm, Cuff Size: Large)   Pulse (!) 59   Temp 97.5 F (36.4 C) (Oral)   Wt 215 lb 8 oz (97.8 kg)   SpO2 97%   BMI 28.83 kg/m    CC: HTN f/u visit Subjective:    Patient ID: Phillip [name], male    DOB: 14-Nov-1940, 75 y.o.   MRN: [id number]  HPI: Phillip [name] is a 75 y.o. male presenting on 08/10/2016 for discuss BP   HTN - Compliant with current antihypertensive regimen of lisinopril 40mg  daily. Does check blood pressures at home: and brings log showing consistently 140-160/80s. No low blood pressure readings or symptoms of dizziness/syncope.  Denies HA, vision changes, CP/tightness, SOB, leg swelling.   BP cuff at home runs about 10 points higher than manual checks in office.   Relevant past medical, surgical, family and social history reviewed and updated as indicated. Interim medical history since our last visit reviewed. Allergies and medications reviewed and updated. Current Outpatient Prescriptions on File Prior to Visit  Medication Sig  . aspirin 81 MG tablet Take 81 mg by mouth daily.    . cholecalciferol (VITAMIN D) 1000 units tablet Take 1,000 Units by mouth daily.  Marland Kitchen docusate sodium (STOOL SOFTENER) 250 MG capsule Take 250 mg by mouth daily.  Marland Kitchen ENSURE PLUS (ENSURE PLUS) LIQD 1 can 8 x a day  . fenofibrate 160 MG tablet Take 1 tablet (160 mg total) by mouth daily. Repeat labs are due now  . Flaxseed, Linseed, (FLAX SEEDS PO) Take by mouth daily.  . fluticasone (FLONASE) 50 MCG/ACT nasal spray Reported on 02/03/2016  . lisinopril (PRINIVIL,ZESTRIL) 40 MG tablet Take 1 tablet (40 mg total) by mouth daily.  . Magnesium 200 MG TABS Take by mouth daily.  . Melatonin 3 MG CAPS Take 1 capsule by mouth at bedtime as needed.   . Multiple Vitamin (MULTIVITAMIN) tablet Take 1 tablet by mouth daily.  . SF 5000 PLUS 1.1 % CREA dental cream   . vitamin C (ASCORBIC ACID) 500 MG tablet Take 500 mg by mouth daily.  . vitamin E 400 UNIT capsule Take 400 Units  by mouth daily.  . zaleplon (SONATA) 10 MG capsule Take 1 capsule (10 mg total) by mouth as needed for sleep.   No current facility-administered medications on file prior to visit.     Review of Systems Per HPI unless specifically indicated in ROS section     Objective:    BP 138/72 (BP Location: Left Arm, Cuff Size: Large)   Pulse (!) 59   Temp 97.5 F (36.4 C) (Oral)   Wt 215 lb 8 oz (97.8 kg)   SpO2 97%   BMI 28.83 kg/m   Wt Readings from Last 3 Encounters:  08/10/16 215 lb 8 oz (97.8 kg)  06/05/16 216 lb (98 kg)  05/29/16 218 lb 4 oz (99 kg)    Physical Exam  Constitutional: He appears well-developed and well-nourished. No distress.  HENT:  Mouth/Throat: Oropharynx is clear and moist. No oropharyngeal exudate.  Cardiovascular: Normal rate, regular rhythm and intact distal pulses.   Murmur (3/6 SEM) heard. Pulmonary/Chest: Effort normal and breath sounds normal. No respiratory distress. He has no wheezes. He has no rales.  Musculoskeletal: He exhibits no edema.  Skin: Skin is warm and dry. No rash noted.  Psychiatric: He has a normal mood and affect.  Nursing note and vitals reviewed.     Assessment & Plan:   Problem List Items  Addressed This Visit    Essential hypertension    Home bp readings averaging high - however office manual BP readings are 10 points lower than at home.  No changes indicated today.  Pt will continue monitoring bp at home. Reassess at 3-4 mo f/u CPE.           Follow up plan: Return in about 3 months (around 11/08/2016) for medicare wellness visit.  [name], MD

## 2016-08-10 NOTE — Assessment & Plan Note (Addendum)
Home bp readings averaging high - however office manual BP readings are 10 points lower than at home.  No changes indicated today.  Pt will continue monitoring bp at home. Reassess at 3-4 mo f/u CPE.

## 2016-08-10 NOTE — Progress Notes (Signed)
Pre visit review using our clinic review tool, if applicable. No additional management support is needed unless otherwise documented below in the visit note. 

## 2016-08-10 NOTE — Patient Instructions (Addendum)
I think our blood pressures are adequate based on our office readings.  Continue current medication.  Return in 3-4 months for physical and medicare wellness visit

## 2016-09-13 DIAGNOSIS — H43813 Vitreous degeneration, bilateral: Secondary | ICD-10-CM | POA: Diagnosis not present

## 2016-10-15 ENCOUNTER — Other Ambulatory Visit: Payer: Self-pay | Admitting: Family Medicine

## 2016-10-15 DIAGNOSIS — R351 Nocturia: Secondary | ICD-10-CM

## 2016-10-15 DIAGNOSIS — E039 Hypothyroidism, unspecified: Secondary | ICD-10-CM

## 2016-10-15 DIAGNOSIS — N401 Enlarged prostate with lower urinary tract symptoms: Secondary | ICD-10-CM

## 2016-10-15 DIAGNOSIS — E781 Pure hyperglyceridemia: Secondary | ICD-10-CM

## 2016-10-15 DIAGNOSIS — D696 Thrombocytopenia, unspecified: Secondary | ICD-10-CM

## 2016-10-15 DIAGNOSIS — C099 Malignant neoplasm of tonsil, unspecified: Secondary | ICD-10-CM

## 2016-10-15 DIAGNOSIS — E559 Vitamin D deficiency, unspecified: Secondary | ICD-10-CM

## 2016-10-16 ENCOUNTER — Other Ambulatory Visit: Payer: Self-pay

## 2016-10-23 ENCOUNTER — Encounter: Payer: Self-pay | Admitting: Family Medicine

## 2016-10-30 ENCOUNTER — Other Ambulatory Visit (INDEPENDENT_AMBULATORY_CARE_PROVIDER_SITE_OTHER): Payer: PPO

## 2016-10-30 DIAGNOSIS — R351 Nocturia: Secondary | ICD-10-CM | POA: Diagnosis not present

## 2016-10-30 DIAGNOSIS — N401 Enlarged prostate with lower urinary tract symptoms: Secondary | ICD-10-CM | POA: Diagnosis not present

## 2016-10-30 DIAGNOSIS — E781 Pure hyperglyceridemia: Secondary | ICD-10-CM

## 2016-10-30 DIAGNOSIS — E559 Vitamin D deficiency, unspecified: Secondary | ICD-10-CM | POA: Diagnosis not present

## 2016-10-30 DIAGNOSIS — E039 Hypothyroidism, unspecified: Secondary | ICD-10-CM

## 2016-10-30 DIAGNOSIS — D696 Thrombocytopenia, unspecified: Secondary | ICD-10-CM

## 2016-10-30 DIAGNOSIS — C099 Malignant neoplasm of tonsil, unspecified: Secondary | ICD-10-CM

## 2016-10-30 LAB — CBC WITH DIFFERENTIAL/PLATELET
BASOS PCT: 0.2 % (ref 0.0–3.0)
Basophils Absolute: 0 10*3/uL (ref 0.0–0.1)
EOS PCT: 2.3 % (ref 0.0–5.0)
Eosinophils Absolute: 0.1 10*3/uL (ref 0.0–0.7)
HEMATOCRIT: 46.7 % (ref 39.0–52.0)
HEMOGLOBIN: 15.9 g/dL (ref 13.0–17.0)
Lymphocytes Relative: 15.1 % (ref 12.0–46.0)
Lymphs Abs: 0.6 10*3/uL — ABNORMAL LOW (ref 0.7–4.0)
MCHC: 34.1 g/dL (ref 30.0–36.0)
MCV: 95.1 fl (ref 78.0–100.0)
MONO ABS: 0.5 10*3/uL (ref 0.1–1.0)
MONOS PCT: 12.5 % — AB (ref 3.0–12.0)
Neutro Abs: 3 10*3/uL (ref 1.4–7.7)
Neutrophils Relative %: 69.9 % (ref 43.0–77.0)
Platelets: 156 10*3/uL (ref 150.0–400.0)
RBC: 4.91 Mil/uL (ref 4.22–5.81)
RDW: 12.9 % (ref 11.5–15.5)
WBC: 4.2 10*3/uL (ref 4.0–10.5)

## 2016-10-30 LAB — BASIC METABOLIC PANEL
BUN: 19 mg/dL (ref 6–23)
CHLORIDE: 108 meq/L (ref 96–112)
CO2: 30 mEq/L (ref 19–32)
Calcium: 10.1 mg/dL (ref 8.4–10.5)
Creatinine, Ser: 1.02 mg/dL (ref 0.40–1.50)
GFR: 75.25 mL/min (ref >60.00)
Glucose, Bld: 129 mg/dL — ABNORMAL HIGH (ref 70–99)
POTASSIUM: 4.6 meq/L (ref 3.5–5.1)
SODIUM: 143 meq/L (ref 135–145)

## 2016-10-30 LAB — LIPID PANEL
CHOL/HDL RATIO: 4
CHOLESTEROL: 139 mg/dL (ref 0–200)
HDL: 37.2 mg/dL — ABNORMAL LOW (ref >39.00)
LDL CALC: 87 mg/dL (ref 0–99)
NonHDL: 101.38
TRIGLYCERIDES: 74 mg/dL (ref 0.0–149.0)
VLDL: 14.8 mg/dL (ref 0.0–40.0)

## 2016-10-31 LAB — TSH: TSH: 5.58 u[IU]/mL — AB (ref 0.35–4.50)

## 2016-10-31 LAB — VITAMIN D 25 HYDROXY (VIT D DEFICIENCY, FRACTURES): VITD: 59.15 ng/mL (ref 30.00–100.00)

## 2016-10-31 LAB — PSA: PSA: 0.38 ng/mL (ref 0.10–4.00)

## 2016-11-06 ENCOUNTER — Encounter: Payer: Self-pay | Admitting: Family Medicine

## 2016-11-06 ENCOUNTER — Ambulatory Visit (INDEPENDENT_AMBULATORY_CARE_PROVIDER_SITE_OTHER): Payer: PPO | Admitting: Family Medicine

## 2016-11-06 VITALS — BP 138/74 | HR 64 | Temp 97.6°F | Wt 215.8 lb

## 2016-11-06 DIAGNOSIS — I10 Essential (primary) hypertension: Secondary | ICD-10-CM | POA: Diagnosis not present

## 2016-11-06 DIAGNOSIS — Z0000 Encounter for general adult medical examination without abnormal findings: Secondary | ICD-10-CM | POA: Diagnosis not present

## 2016-11-06 DIAGNOSIS — I779 Disorder of arteries and arterioles, unspecified: Secondary | ICD-10-CM

## 2016-11-06 DIAGNOSIS — Z7189 Other specified counseling: Secondary | ICD-10-CM | POA: Diagnosis not present

## 2016-11-06 DIAGNOSIS — I739 Peripheral vascular disease, unspecified: Secondary | ICD-10-CM

## 2016-11-06 DIAGNOSIS — E781 Pure hyperglyceridemia: Secondary | ICD-10-CM | POA: Diagnosis not present

## 2016-11-06 DIAGNOSIS — E039 Hypothyroidism, unspecified: Secondary | ICD-10-CM

## 2016-11-06 DIAGNOSIS — N401 Enlarged prostate with lower urinary tract symptoms: Secondary | ICD-10-CM

## 2016-11-06 DIAGNOSIS — R739 Hyperglycemia, unspecified: Secondary | ICD-10-CM

## 2016-11-06 DIAGNOSIS — H9193 Unspecified hearing loss, bilateral: Secondary | ICD-10-CM | POA: Diagnosis not present

## 2016-11-06 DIAGNOSIS — R351 Nocturia: Secondary | ICD-10-CM | POA: Diagnosis not present

## 2016-11-06 DIAGNOSIS — D696 Thrombocytopenia, unspecified: Secondary | ICD-10-CM

## 2016-11-06 DIAGNOSIS — R011 Cardiac murmur, unspecified: Secondary | ICD-10-CM

## 2016-11-06 MED ORDER — MIRABEGRON ER 25 MG PO TB24: 25.0000 mg | ORAL_TABLET | Freq: Every day | ORAL | 6 refills | Status: DC

## 2016-11-06 NOTE — Assessment & Plan Note (Addendum)
Chronic, stable. Continue current regimen. 

## 2016-11-06 NOTE — Patient Instructions (Addendum)
Bring me copy of your living will/advanced directive.  Price out [employer] - sent to pharmacy.  We will keep a close eye on thyroid and sugars.  Look into glucerna supplement.  Return in 6 months for follow up visit and labs.  Good to see you today, call us with questions.   Health Maintenance, Male A healthy lifestyle and preventive care is important for your health and wellness. Ask your health care provider about what schedule of regular examinations is right for you. What should I know about weight and diet?  Eat a Healthy Diet  Eat plenty of vegetables, fruits, whole grains, low-fat dairy products, and lean protein.  Do not eat a lot of foods high in solid fats, added sugars, or salt. Maintain a Healthy Weight  Regular exercise can help you achieve or maintain a healthy weight. You should:  Do at least 150 minutes of exercise each week. The exercise should increase your heart rate and make you sweat (moderate-intensity exercise).  Do strength-training exercises at least twice a week. Watch Your Levels of Cholesterol and Blood Lipids  Have your blood tested for lipids and cholesterol every 5 years starting at 77 years of age. If you are at high risk for heart disease, you should start having your blood tested when you are 77 years old. You may need to have your cholesterol levels checked more often if:  Your lipid or cholesterol levels are high.  You are older than 77 years of age.  You are at high risk for heart disease. What should I know about cancer screening? Many types of cancers can be detected early and may often be prevented. Lung Cancer  You should be screened every year for lung cancer if:  You are a current smoker who has smoked for at least 30 years.  You are a former smoker who has quit within the past 15 years.  Talk to your health care provider about your screening options, when you should start screening, and how often you should be screened. Colorectal  Cancer  Routine colorectal cancer screening usually begins at 77 years of age and should be repeated every 5-10 years until you are 77 years old. You may need to be screened more often if early forms of precancerous polyps or small growths are found. Your health care provider may recommend screening at an earlier age if you have risk factors for colon cancer.  Your health care provider may recommend using home test kits to check for hidden blood in the stool.  A small camera at the end of a tube can be used to examine your colon (sigmoidoscopy or colonoscopy). This checks for the earliest forms of colorectal cancer. Prostate and Testicular Cancer  Depending on your age and overall health, your health care provider may do certain tests to screen for prostate and testicular cancer.  Talk to your health care provider about any symptoms or concerns you have about testicular or prostate cancer. Skin Cancer  Check your skin from head to toe regularly.  Tell your health care provider about any new moles or changes in moles, especially if:  There is a change in a mole's size, shape, or color.  You have a mole that is larger than a pencil eraser.  Always use sunscreen. Apply sunscreen liberally and repeat throughout the day.  Protect yourself by wearing long sleeves, pants, a wide-brimmed hat, and sunglasses when outside. What should I know about heart disease, diabetes, and high blood pressure?  If you are 14-91 years of age, have your blood pressure checked every 3-5 years. If you are 69 years of age or older, have your blood pressure checked every year. You should have your blood pressure measured twice-once when you are at a hospital or clinic, and once when you are not at a hospital or clinic. Record the average of the two measurements. To check your blood pressure when you are not at a hospital or clinic, you can use:  An automated blood pressure machine at a pharmacy.  A home blood  pressure monitor.  Talk to your health care provider about your target blood pressure.  If you are between 85-88 years old, ask your health care provider if you should take aspirin to prevent heart disease.  Have regular diabetes screenings by checking your fasting blood sugar level.  If you are at a normal weight and have a low risk for diabetes, have this test once every three years after the age of 68.  If you are overweight and have a high risk for diabetes, consider being tested at a younger age or more often.  A one-time screening for abdominal aortic aneurysm (AAA) by ultrasound is recommended for men aged 24-75 years who are current or former smokers. What should I know about preventing infection? Hepatitis B  If you have a higher risk for hepatitis B, you should be screened for this virus. Talk with your health care provider to find out if you are at risk for hepatitis B infection. Hepatitis C  Blood testing is recommended for:  Everyone born from [birth year] through 1966.  Anyone with known risk factors for hepatitis C. Sexually Transmitted Diseases (STDs)  You should be screened each year for STDs including gonorrhea and chlamydia if:  You are sexually active and are younger than 77 years of age.  You are older than 77 years of age and your health care provider tells you that you are at risk for this type of infection.  Your sexual activity has changed since you were last screened and you are at an increased risk for chlamydia or gonorrhea. Ask your health care provider if you are at risk.  Talk with your health care provider about whether you are at high risk of being infected with HIV. Your health care provider may recommend a prescription medicine to help prevent HIV infection. What else can I do?  Schedule regular health, dental, and eye exams.  Stay current with your vaccines (immunizations).  Do not use any tobacco products, such as cigarettes, chewing tobacco, and  e-cigarettes. If you need help quitting, ask your health care provider.  Limit alcohol intake to no more than 2 drinks per day. One drink equals 12 ounces of beer, 5 ounces of wine, or 1 ounces of hard liquor.  Do not use street drugs.  Do not share needles.  Ask your health care provider for help if you need support or information about quitting drugs.  Tell your health care provider if you often feel depressed.  Tell your health care provider if you have ever been abused or do not feel safe at home. This information is not intended to replace advice given to you by your health care provider. Make sure you discuss any questions you have with your health care provider. Document Released: 02/03/2008 Document Revised: 04/05/2016 Document Reviewed: 05/11/2015 Elsevier Interactive Patient Education  2017 [employer].

## 2016-11-06 NOTE — Progress Notes (Signed)
BP 138/74   Pulse 64   Temp 97.6 F (36.4 C) (Oral)   Wt 215 lb 12 oz (97.9 kg)   BMI 28.86 kg/m    CC: CPE Subjective:    Patient ID: Phillip [name], male    DOB: 28-Nov-1940, 76 y.o.   MRN: [id number]  HPI: Phillip [name] is a 76 y.o. male presenting on 11/06/2016 for Annual Exam   Saw Katha Cabal 05/2016 for medicare wellness visit. Note reviewed. Here for physical.   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   Ongoing BPH symptoms of nocturia, urgency, as well as urinary urgency throughout the day. Some foot on floor phenomenon. Prior on avodart and other meds.   Preventative: COLONOSCOPY 03/2015 TA x3, HP, melanosis coli, severe diverticulosis, rpt 3 yrs ([facility]) Prostate cancer screening - prior saw urology (Dr Risa Grill). Ongoing LUTS and nocturia.  Lung cancer screening - not eligible Flu shot - yearly Td 2010  Pneumovax 2007, prevnar 2015 x2 Zostavax per patient.  Advanced directive discussion - has at home. Wife is HCPOA. Asked to bring [place] copy.  Seat belt use discussed Sunscreen use discussed. No changing moles on skin. 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  Plays golf, remains very active with an interest in politics. Jan 2011 moved to area.  Involved in [employer].  He is a runner - has run [employer] and [employer]. S/p torn meniscus.   Relevant past medical, surgical, family and social history reviewed and updated as indicated. Interim medical history since our last visit reviewed. Allergies and medications reviewed and updated. Outpatient Medications Prior to Visit  Medication Sig Dispense Refill  . aspirin 81 MG tablet Take 81 mg by mouth daily.      . cholecalciferol (VITAMIN D) 1000 units tablet Take 1,000 Units by mouth daily.    Marland Kitchen docusate  sodium (STOOL SOFTENER) 250 MG capsule Take 250 mg by mouth daily.    Marland Kitchen ENSURE PLUS (ENSURE PLUS) LIQD 1 can 8 x a day 240 Can 11  . fenofibrate 160 MG tablet Take 1 tablet (160 mg total) by mouth daily. Repeat labs are due now 90 tablet 2  . Flaxseed, Linseed, (FLAX SEEDS PO) Take by mouth daily.    . fluticasone (FLONASE) 50 MCG/ACT nasal spray Reported on 02/03/2016    . lisinopril (PRINIVIL,ZESTRIL) 40 MG tablet Take 1 tablet (40 mg total) by mouth daily. 90 tablet 3  . Magnesium 200 MG TABS Take by mouth daily.    . Melatonin 3 MG CAPS Take 1 capsule by mouth at bedtime as needed.     . Multiple Vitamin (MULTIVITAMIN) tablet Take 1 tablet by mouth daily.    . SF 5000 PLUS 1.1 % CREA dental cream     . vitamin C (ASCORBIC ACID) 500 MG tablet Take 500 mg by mouth daily.    . vitamin E 400 UNIT capsule Take 400 Units by mouth daily.    . zaleplon (SONATA) 10 MG capsule Take 1 capsule (10 mg total) by mouth as needed for sleep. 90 capsule 0   No facility-administered medications prior to visit.      Per HPI unless specifically indicated in ROS section below Review of Systems  Constitutional: Negative for activity change, appetite change, chills, fatigue, fever and unexpected weight change.  HENT: Positive for congestion (recent flu like illness). Negative for hearing loss.   Eyes: Negative for visual disturbance.  Respiratory: Negative for cough, chest tightness, shortness of breath and wheezing.   Cardiovascular: Negative for chest pain, palpitations and leg swelling.  Gastrointestinal: Positive for constipation (mild). Negative for abdominal distention, abdominal pain, blood in stool, diarrhea, nausea and vomiting.  Genitourinary: Negative for difficulty urinating and hematuria.  Musculoskeletal: Negative for arthralgias, myalgias and neck pain.  Skin: Negative for rash.  Neurological: Negative for dizziness, seizures, syncope and headaches.  Hematological: Negative for adenopathy.  Bruises/bleeds easily.  Psychiatric/Behavioral: Negative for dysphoric mood. The patient is not nervous/anxious.        Objective:    BP 138/74   Pulse 64   Temp 97.6 F (36.4 C) (Oral)   Wt 215 lb 12 oz (97.9 kg)   BMI 28.86 kg/m   Wt Readings from Last 3 Encounters:  11/06/16 215 lb 12 oz (97.9 kg)  08/10/16 215 lb 8 oz (97.8 kg)  06/05/16 216 lb (98 kg)    Physical Exam  Constitutional: He is oriented to person, place, and time. He appears well-developed and well-nourished. No distress.  HENT:  Head: Normocephalic and atraumatic.  Nose: Nose normal.  Mouth/Throat: Uvula is midline, oropharynx is clear and moist and mucous membranes are normal. No oropharyngeal exudate, posterior oropharyngeal edema or posterior oropharyngeal erythema.  Eyes: Conjunctivae and EOM are normal. Pupils are equal, round, and reactive to light. No scleral icterus.  Neck: Normal range of motion. Carotid bruit is not present. No thyromegaly present.  Woody consistency of neck  Cardiovascular: Normal rate, regular rhythm and intact distal pulses.   Murmur (2/6 mid click with late systolic murmur best at apex) heard. Pulses:      Radial pulses are 2+ on the right side, and 2+ on the left side.  Pulmonary/Chest: Effort normal and breath sounds normal. No respiratory distress. He has no wheezes. He has no rales.  Abdominal: Soft. Bowel sounds are normal. He exhibits no distension and no mass. There is no tenderness. There is no rebound and no guarding.  Genitourinary: Rectum normal and prostate normal. Rectal exam shows no external hemorrhoid, no internal hemorrhoid, no fissure, no mass, no tenderness and anal tone normal. Prostate is not enlarged (20gm) and not tender.  Musculoskeletal: Normal range of motion. He exhibits no edema.  Lymphadenopathy:    He has no cervical adenopathy.  Neurological: He is alert and oriented to person, place, and time.  CN grossly intact, station and gait intact  Skin:  Skin is warm and dry. No rash noted.  Psychiatric: He has a normal mood and affect. His behavior is normal. Judgment and thought content normal.  Nursing note and vitals reviewed.  Results for orders placed or performed in visit on 10/30/16  VITAMIN D 25 Hydroxy (Vit-D Deficiency, Fractures)  Result Value Ref Range   VITD 59.15 30.00 - 100.00 ng/mL  Lipid panel  Result Value Ref Range   Cholesterol 139 0 - 200 mg/dL   Triglycerides 74.0 0.0 - 149.0 mg/dL   HDL 37.20 (L) >39.00 mg/dL   VLDL 14.8 0.0 - 40.0 mg/dL   LDL Cholesterol 87 0 - 99 mg/dL   Total CHOL/HDL Ratio 4    NonHDL 696.29   Basic metabolic panel  Result Value Ref Range   Sodium 143 135 - 145 mEq/L   Potassium 4.6 3.5 - 5.1 mEq/L   Chloride 108 96 - 112 mEq/L   CO2  30 19 - 32 mEq/L   Glucose, Bld 129 (H) 70 - 99 mg/dL   BUN 19 6 - 23 mg/dL   Creatinine, Ser 1.02 0.40 - 1.50 mg/dL   Calcium 10.1 8.4 - 10.5 mg/dL   GFR 75.25 >60.00 mL/min  CBC with Differential/Platelet  Result Value Ref Range   WBC 4.2 4.0 - 10.5 K/uL   RBC 4.91 4.22 - 5.81 Mil/uL   Hemoglobin 15.9 13.0 - 17.0 g/dL   HCT 46.7 39.0 - 52.0 %   MCV 95.1 78.0 - 100.0 fl   MCHC 34.1 30.0 - 36.0 g/dL   RDW 12.9 11.5 - 15.5 %   Platelets 156.0 150.0 - 400.0 K/uL   Neutrophils Relative % 69.9 43.0 - 77.0 %   Lymphocytes Relative 15.1 12.0 - 46.0 %   Monocytes Relative 12.5 (H) 3.0 - 12.0 %   Eosinophils Relative 2.3 0.0 - 5.0 %   Basophils Relative 0.2 0.0 - 3.0 %   Neutro Abs 3.0 1.4 - 7.7 K/uL   Lymphs Abs 0.6 (L) 0.7 - 4.0 K/uL   Monocytes Absolute 0.5 0.1 - 1.0 K/uL   Eosinophils Absolute 0.1 0.0 - 0.7 K/uL   Basophils Absolute 0.0 0.0 - 0.1 K/uL  PSA  Result Value Ref Range   PSA 0.38 0.10 - 4.00 ng/mL  TSH  Result Value Ref Range   TSH 5.58 (H) 0.35 - 4.50 uIU/mL      Assessment & Plan:   Problem List Items Addressed This Visit    Advanced care planning/counseling discussion    Advanced directive discussion - has at home. Wife is  HCPOA. Asked to bring [place] copy.       Bilateral carotid artery disease (HCC)    Mild. Not heard today Consider updated [place] next year.       BPH associated with nocturia    Ongoing BPH symptoms along with urinary urgency.  Prior on multiple meds and had seen urology.  No significant BPH on DRE today.       Essential hypertension    Chronic, stable. Continue current regimen.       Health care maintenance - Primary    Preventative protocols reviewed and updated unless pt declined. Discussed healthy diet and lifestyle.       Hearing loss    Wears hearing aides.       Hyperglycemia    Check A1c next labs. Pt takes 7 ensure per day - due to post-radiation dysphagia and xerostomia. Discussed looking into glucerna for some of his supplements       Hypertriglyceridemia    Chronic, stable on fenofibrate.       Hypothyroid    Off meds. TSH a bit elevated - will recheck in 6 months. Pt agrees with plan. Endorses occasional constipation.      Systolic murmur    Anticipate possible mild MVP - will continue to monitor.       Thrombocytopenia (HCC)    Recent readings stable.           Follow up plan: Return in about 6 months (around 05/09/2017) for follow up visit, medicare wellness visit.  [name], MD

## 2016-11-06 NOTE — Progress Notes (Signed)
Pre visit review using our clinic review tool, if applicable. No additional management support is needed unless otherwise documented below in the visit note. 

## 2016-11-06 NOTE — Assessment & Plan Note (Addendum)
Ongoing BPH symptoms along with urinary urgency.  Prior on multiple meds and had seen urology.  No significant BPH on DRE today.

## 2016-11-06 NOTE — Assessment & Plan Note (Signed)
Anticipate possible mild MVP - will continue to monitor.

## 2016-11-06 NOTE — Assessment & Plan Note (Addendum)
Off meds. TSH a bit elevated - will recheck in 6 months. Pt agrees with plan. Endorses occasional constipation.

## 2016-11-06 NOTE — Assessment & Plan Note (Signed)
Wears hearing aides 

## 2016-11-06 NOTE — Assessment & Plan Note (Addendum)
Mild. Not heard today Consider updated [place] next year.

## 2016-11-06 NOTE — Assessment & Plan Note (Signed)
Chronic, stable on fenofibrate.

## 2016-11-06 NOTE — Assessment & Plan Note (Addendum)
Check A1c next labs. Pt takes 7 ensure per day - due to post-radiation dysphagia and xerostomia. Discussed looking into glucerna for some of his supplements

## 2016-11-06 NOTE — Assessment & Plan Note (Signed)
Advanced directive discussion - has at home. Wife is HCPOA. Asked to bring [place] copy.

## 2016-11-06 NOTE — Assessment & Plan Note (Signed)
Preventative protocols reviewed and updated unless pt declined. Discussed healthy diet and lifestyle.  

## 2016-11-06 NOTE — Assessment & Plan Note (Signed)
Recent readings stable.

## 2016-12-04 ENCOUNTER — Telehealth: Payer: Self-pay

## 2016-12-04 DIAGNOSIS — C099 Malignant neoplasm of tonsil, unspecified: Secondary | ICD-10-CM

## 2016-12-04 DIAGNOSIS — R1310 Dysphagia, unspecified: Secondary | ICD-10-CM

## 2016-12-04 DIAGNOSIS — T66XXXS Radiation sickness, unspecified, sequela: Secondary | ICD-10-CM

## 2016-12-04 DIAGNOSIS — R739 Hyperglycemia, unspecified: Secondary | ICD-10-CM

## 2016-12-04 NOTE — Telephone Encounter (Signed)
Rx written and in Kim[facility]' box.  He can still take some ensure but remind me again what is his goal caloric intake per day? Wrote for 10 glucerna/day for goal calories 2000 /day.  Would also offer nutritionist referral for further recommendations on higher density protein alternatives to glucerna/ensure (although I believe he has seen in the past)?

## 2016-12-04 NOTE — Telephone Encounter (Signed)
Pt request rx for glucerna; pt buys from [facility] in [place]. Pt request rx for glucerna 8 oz container; pt drinks approx 13 per day if pt does not alternate with ensure. Pt wants to verify he is not supposed to alternate the glucerna with ensure. Pt last seen 11/06/16. Pt request cb when rx ready for pick up.

## 2016-12-05 DIAGNOSIS — T66XXXA Radiation sickness, unspecified, initial encounter: Secondary | ICD-10-CM | POA: Insufficient documentation

## 2016-12-05 NOTE — Telephone Encounter (Signed)
Patient notified. He said his goal is 2800 calories daily. He would like the referral because he only saw one when he was receiving his treatments and would like to see one now that he has finished those. He will be awaiting call. Rx placed up front for pick up.

## 2016-12-05 NOTE — Telephone Encounter (Signed)
Referral placed.

## 2016-12-13 ENCOUNTER — Ambulatory Visit: Payer: Self-pay | Admitting: Dietician

## 2016-12-18 ENCOUNTER — Encounter: Payer: PPO | Attending: Family Medicine | Admitting: Dietician

## 2016-12-18 ENCOUNTER — Encounter: Payer: Self-pay | Admitting: Dietician

## 2016-12-18 VITALS — Ht 74.0 in | Wt 213.9 lb

## 2016-12-18 DIAGNOSIS — Z923 Personal history of irradiation: Secondary | ICD-10-CM | POA: Diagnosis not present

## 2016-12-18 DIAGNOSIS — K0889 Other specified disorders of teeth and supporting structures: Secondary | ICD-10-CM

## 2016-12-18 DIAGNOSIS — R633 Feeding difficulties: Secondary | ICD-10-CM

## 2016-12-18 DIAGNOSIS — Z8589 Personal history of malignant neoplasm of other organs and systems: Secondary | ICD-10-CM | POA: Insufficient documentation

## 2016-12-18 DIAGNOSIS — R739 Hyperglycemia, unspecified: Secondary | ICD-10-CM | POA: Insufficient documentation

## 2016-12-18 DIAGNOSIS — Z713 Dietary counseling and surveillance: Secondary | ICD-10-CM | POA: Diagnosis not present

## 2016-12-18 NOTE — Patient Instructions (Signed)
   When drinking Glucerna or Ensure for a meal, drink either 3 Glucerna cartons or 1 Ensure and 1 Glucerna.  For 2800 calorie daily intake, aim for 350grams Carbohydrates, 113grams protein, and 93grams fat daily averages.   Try to include some solid food, especially protein and/or vegetables along with 1 glucerna or Ensure when you can.

## 2016-12-18 NOTE — Progress Notes (Signed)
Medical Nutrition Therapy: Visit start time: 0930  end time: 1030  Assessment:  Diagnosis: hyperglycemia, hx tonsil CA with effects from radiation Past medical history: back surgery 2016 Psychosocial issues/ stress concerns: none Preferred learning method:  . No preference indicated  Current weight: 213.9lbs with shoes   Height: 6'2" Medications, supplements: reconciled list in medical record  Progress and evaluation: Patient reports trouble chewing due to gum tenderness, and taste changes since radiation treatment in 2012. He was placed on feeding tube for several months during/ following treatment, and has since been drinking Ensure Plus throughout the day for nutrition. He is now able to eat some solid foods, but reports aversion to food residue in his mouth. He and his wife enjoy eating out, and he does eat some food when out with her. His blood glucose has recently increased to 129 (fasting), and he now seeks help with controlling sugar and carbohydrate intake while ensuring adequate daily nutrition.   Physical activity: working to increase; has been active, athletic since childhood, now participates in [employer] annually, some walking and golfing.   Dietary Intake:  Usual eating pattern includes 4 meals and 0 snacks per day. Dining out frequency: multiple meals per week. Patient follows plan for 2800kcal daily intake, 700kcal for each meal. He reports maintaining his weight at that level.   Breakfast: 8am-- glucerna (3), or ensure (2).  Snack: none Lunch: 12pm-- sometimes eggs, sausage, pancakes with sf syrup; hot dog with loaf bread or no bread; [employer] chili; ham or [place] with cheese; or same as breakfast. Includes 1 supplemental drink if meal is light.  Snack: none Supper: 4pm-- sometimes out; meat loaf, chicken, fried shrimp, [employer] soup and salad, some beans and green vegetables. Includes 1 supplement drink if meal is light.  Snack: 8pm-- 3 glucerna or 2  ensure Beverages: water, ensure, glucerna  Nutrition Care Education: Topics covered: hyperglycemia, general nutrition, chewing and taste changes Basic nutrition: basic food groups, appropriate nutrient balance-- healthy balance of CHO, protein, fat, and calculated goal levels for each nutrient based on caloric intake.  Hyperglycemia:  appropriate carb intake and balance, importance of protein, role of physical activity Chewing and taste changes: addressed intake of supplemental beverages to meet nutritional needs; discussed nutritional value in terms of carbohydrate and protein of solid foods, benefits of consuming solid foods regularly.   Nutritional Diagnosis:  [place]-2.2 Altered nutrition-related laboratory As related to hyperglycemia.  As evidenced by lab report. [place]-1.2 Biting/Chewing (masticatory) difficulty As related to effects from radiation treatment.  As evidenced by patient report.  Intervention: Instruction as noted above.   Set goals with direction from patient.    Patient will reassess need for RD help after next MD appointment and schedule follow-up if needed at that time.   Education Materials given:   [employer], patient states he is already familiar with food groups. . Goals/ instructions  Learner/ who was taught:  . Patient   Level of understanding: Marland Kitchen Verbalizes/ demonstrates competency  Demonstrated degree of understanding via:   Teach back Learning barriers: . None  Willingness to learn/ readiness for change: . Eager, change in progress  Monitoring and Evaluation:  Dietary intake, exercise, BG control, and body weight      follow up: prn

## 2017-02-02 ENCOUNTER — Telehealth: Payer: Self-pay | Admitting: Family Medicine

## 2017-02-02 NOTE — Telephone Encounter (Signed)
Pt brought form in from [place] that needs to be completed so he can continue to participate in activities. He is requesting it be faxed to [phone] and a call to let him know it was completed. I placed in Rx tower.

## 2017-02-02 NOTE — Telephone Encounter (Signed)
In your box to fill out

## 2017-02-05 NOTE — Telephone Encounter (Signed)
Filled and in my outbox

## 2017-02-05 NOTE — Telephone Encounter (Signed)
Patient notified form faxed to [facility].

## 2017-03-16 ENCOUNTER — Other Ambulatory Visit: Payer: Self-pay | Admitting: Family Medicine

## 2017-04-26 ENCOUNTER — Other Ambulatory Visit: Payer: Self-pay | Admitting: Family Medicine

## 2017-04-26 DIAGNOSIS — D696 Thrombocytopenia, unspecified: Secondary | ICD-10-CM

## 2017-04-26 DIAGNOSIS — R739 Hyperglycemia, unspecified: Secondary | ICD-10-CM

## 2017-04-26 DIAGNOSIS — E039 Hypothyroidism, unspecified: Secondary | ICD-10-CM

## 2017-04-26 DIAGNOSIS — E781 Pure hyperglyceridemia: Secondary | ICD-10-CM

## 2017-04-27 ENCOUNTER — Other Ambulatory Visit (INDEPENDENT_AMBULATORY_CARE_PROVIDER_SITE_OTHER): Payer: PPO

## 2017-04-27 DIAGNOSIS — R739 Hyperglycemia, unspecified: Secondary | ICD-10-CM

## 2017-04-27 DIAGNOSIS — E781 Pure hyperglyceridemia: Secondary | ICD-10-CM | POA: Diagnosis not present

## 2017-04-27 DIAGNOSIS — E039 Hypothyroidism, unspecified: Secondary | ICD-10-CM

## 2017-04-27 LAB — T4, FREE: FREE T4: 0.91 ng/dL (ref 0.60–1.60)

## 2017-04-27 LAB — BASIC METABOLIC PANEL
BUN: 23 mg/dL (ref 6–23)
CALCIUM: 10.2 mg/dL (ref 8.4–10.5)
CO2: 30 mEq/L (ref 19–32)
Chloride: 107 mEq/L (ref 96–112)
Creatinine, Ser: 1.17 mg/dL (ref 0.40–1.50)
GFR: 64.15 mL/min (ref >60.00)
Glucose, Bld: 124 mg/dL — ABNORMAL HIGH (ref 70–99)
Potassium: 4.8 mEq/L (ref 3.5–5.1)
Sodium: 142 mEq/L (ref 135–145)

## 2017-04-27 LAB — TSH: TSH: 4.28 u[IU]/mL (ref 0.35–4.50)

## 2017-04-27 LAB — HEMOGLOBIN A1C: Hgb A1c MFr Bld: 5.8 % (ref 4.6–6.5)

## 2017-04-30 ENCOUNTER — Other Ambulatory Visit: Payer: PPO

## 2017-05-01 ENCOUNTER — Ambulatory Visit (INDEPENDENT_AMBULATORY_CARE_PROVIDER_SITE_OTHER): Payer: PPO | Admitting: Family Medicine

## 2017-05-01 ENCOUNTER — Encounter: Payer: Self-pay | Admitting: Family Medicine

## 2017-05-01 VITALS — BP 124/78 | HR 78 | Temp 98.0°F | Wt 207.0 lb

## 2017-05-01 DIAGNOSIS — I2510 Atherosclerotic heart disease of native coronary artery without angina pectoris: Secondary | ICD-10-CM | POA: Diagnosis not present

## 2017-05-01 DIAGNOSIS — N401 Enlarged prostate with lower urinary tract symptoms: Secondary | ICD-10-CM | POA: Diagnosis not present

## 2017-05-01 DIAGNOSIS — T66XXXS Radiation sickness, unspecified, sequela: Secondary | ICD-10-CM

## 2017-05-01 DIAGNOSIS — Z23 Encounter for immunization: Secondary | ICD-10-CM

## 2017-05-01 DIAGNOSIS — R7303 Prediabetes: Secondary | ICD-10-CM | POA: Diagnosis not present

## 2017-05-01 DIAGNOSIS — E039 Hypothyroidism, unspecified: Secondary | ICD-10-CM

## 2017-05-01 DIAGNOSIS — R351 Nocturia: Secondary | ICD-10-CM | POA: Diagnosis not present

## 2017-05-01 MED ORDER — TAMSULOSIN HCL 0.4 MG PO CAPS: 0.4000 mg | ORAL_CAPSULE | Freq: Every day | ORAL | 3 refills | Status: DC

## 2017-05-01 NOTE — Assessment & Plan Note (Addendum)
Failed myrbetriq - will stop. Will trial flomax.  Previously on avodart, previously saw [facility].

## 2017-05-01 NOTE — Patient Instructions (Addendum)
Flu shot today Trial flomax 0.4mg  nightly for urinary symptoms. You are doing well today.  Return as needed or in 6 months for physical and medicare wellness visit.

## 2017-05-01 NOTE — Progress Notes (Signed)
BP 124/78 (BP Location: Left Arm, Patient Position: Sitting, Cuff Size: Normal)   Pulse 78   Temp 98 F (36.7 C) (Oral)   Wt 207 lb (93.9 kg)   SpO2 96%   BMI 26.58 kg/m    CC: 6 mo f/u visit Subjective:    Patient ID: Phillip [name], male    DOB: 07/07/1941, 76 y.o.   MRN: [id number]  HPI: Phillip [name] is a 76 y.o. male presenting on 05/01/2017 for 6 mo follow-up   See prior note for details.  Saw nutritionist - aiming for 2800 cal diet - 350gm carbs, 113 gm protein, 93 gm fat. He is taking 3 glucerna (660 cal) 4 times a day as well as solid food. Either takes 3 glucerna or 1 glucerna + 1 ensure.   Weight loss noted - he has decreased caloric intake and during summer he is more active. Pt is happy about this because he has noticed improvement in blood pressure control with weight loss.   Ongoing nocturia Q2hrs despite myrbetriq 25mg . Requests to stop this medicine.    Relevant past medical, surgical, family and social history reviewed and updated as indicated. Interim medical history since our last visit reviewed. Allergies and medications reviewed and updated. Outpatient Medications Prior to Visit  Medication Sig Dispense Refill  . aspirin 81 MG tablet Take 81 mg by mouth daily.      Marland Kitchen b complex vitamins tablet Take 1 tablet by mouth daily.    . cholecalciferol (VITAMIN D) 1000 units tablet Take 1,000 Units by mouth daily.    Marland Kitchen docusate sodium (STOOL SOFTENER) 250 MG capsule Take 250 mg by mouth daily.    Marland Kitchen ENSURE PLUS (ENSURE PLUS) LIQD 1 can 8 x a day 240 Can 11  . fenofibrate 160 MG tablet Take 1 tablet (160 mg total) by mouth daily. 90 tablet 2  . Flaxseed, Linseed, (FLAX SEEDS PO) Take by mouth daily.    . fluticasone (FLONASE) 50 MCG/ACT nasal spray Reported on 02/03/2016    . lisinopril (PRINIVIL,ZESTRIL) 40 MG tablet Take 1 tablet (40 mg total) by mouth daily. 90 tablet 3  . Magnesium 200 MG TABS Take by mouth daily.    . Melatonin 3 MG CAPS Take 1 capsule by mouth at  bedtime as needed.     . Multiple Vitamin (MULTIVITAMIN) tablet Take 1 tablet by mouth daily.    Marland Kitchen POTASSIUM GLUCONATE PO Take by mouth.    . SF 5000 PLUS 1.1 % CREA dental cream     . vitamin C (ASCORBIC ACID) 500 MG tablet Take 500 mg by mouth daily.    . vitamin E 400 UNIT capsule Take 400 Units by mouth daily.    . zaleplon (SONATA) 10 MG capsule Take 1 capsule (10 mg total) by mouth as needed for sleep. 90 capsule 0  . mirabegron ER (MYRBETRIQ) 25 MG TB24 tablet Take 1 tablet (25 mg total) by mouth daily. 30 tablet 6   No facility-administered medications prior to visit.      Per HPI unless specifically indicated in ROS section below Review of Systems     Objective:    BP 124/78 (BP Location: Left Arm, Patient Position: Sitting, Cuff Size: Normal)   Pulse 78   Temp 98 F (36.7 C) (Oral)   Wt 207 lb (93.9 kg)   SpO2 96%   BMI 26.58 kg/m   Wt Readings from Last 3 Encounters:  05/01/17 207 lb (93.9 kg)  12/18/16 213 lb  14.4 oz (97 kg)  11/06/16 215 lb 12 oz (97.9 kg)    Physical Exam  Constitutional: He appears well-developed and well-nourished. No distress.  HENT:  Mouth/Throat: Oropharynx is clear and moist. No oropharyngeal exudate.  Eyes: Pupils are equal, round, and reactive to light. Conjunctivae and EOM are normal.  Neck:  Woody consistency of neck  Cardiovascular: Normal rate, regular rhythm and intact distal pulses.   Murmur (2/6 systolic) heard. Pulmonary/Chest: Effort normal and breath sounds normal. No respiratory distress. He has no wheezes. He has no rales.  Musculoskeletal: He exhibits no edema.  Psychiatric: He has a normal mood and affect.  Nursing note and vitals reviewed.  Results for orders placed or performed in visit on 04/27/17  TSH  Result Value Ref Range   TSH 4.28 0.35 - 4.50 uIU/mL  T4, free  Result Value Ref Range   Free T4 0.91 0.60 - 1.60 ng/dL  Basic metabolic panel  Result Value Ref Range   Sodium 142 135 - 145 mEq/L   Potassium  4.8 3.5 - 5.1 mEq/L   Chloride 107 96 - 112 mEq/L   CO2 30 19 - 32 mEq/L   Glucose, Bld 124 (H) 70 - 99 mg/dL   BUN 23 6 - 23 mg/dL   Creatinine, Ser 1.17 0.40 - 1.50 mg/dL   Calcium 10.2 8.4 - 10.5 mg/dL   GFR 64.15 >60.00 mL/min  Hemoglobin A1c  Result Value Ref Range   Hgb A1c MFr Bld 5.8 4.6 - 6.5 %      Assessment & Plan:   Problem List Items Addressed This Visit    Adverse effect of radiation    He saw nutritionist and has new diet goals and plan.       BPH associated with nocturia - Primary    Failed myrbetriq - will stop. Will trial flomax.  Previously on avodart, previously saw [facility].       Relevant Medications   tamsulosin (FLOMAX) 0.4 MG CAPS capsule   CAD (coronary artery disease)    Nonobstructive. Pt desires to continue aspirin use.       Hypothyroid    TSH remains stable off medication. Will continue to monitor.       Prediabetes    Reviewed readings with patient. He has transitioned to glucerna use. Will continue to monitor.       Other Visit Diagnoses    Need for influenza vaccination       Relevant Orders   Flu Vaccine QUAD 6+ mos PF IM (Fluarix Quad PF) (Completed)       Follow up plan: Return in about 6 months (around 10/29/2017) for annual exam, prior fasting for blood work, medicare wellness visit.  [name], MD

## 2017-05-02 ENCOUNTER — Ambulatory Visit: Payer: PPO | Admitting: Family Medicine

## 2017-05-02 NOTE — Assessment & Plan Note (Signed)
TSH remains stable off medication. Will continue to monitor.

## 2017-05-02 NOTE — Assessment & Plan Note (Signed)
He saw nutritionist and has new diet goals and plan.

## 2017-05-02 NOTE — Assessment & Plan Note (Signed)
Nonobstructive. Pt desires to continue aspirin use.

## 2017-05-02 NOTE — Assessment & Plan Note (Signed)
Reviewed readings with patient. He has transitioned to glucerna use. Will continue to monitor.

## 2017-06-16 ENCOUNTER — Emergency Department
Admission: EM | Admit: 2017-06-16 | Discharge: 2017-06-17 | Disposition: A | Discharge status: Home / Self Care | Payer: PPO | Attending: Emergency Medicine | Admitting: Emergency Medicine

## 2017-06-16 DIAGNOSIS — R600 Localized edema: Secondary | ICD-10-CM | POA: Diagnosis present

## 2017-06-16 DIAGNOSIS — Z85828 Personal history of other malignant neoplasm of skin: Secondary | ICD-10-CM | POA: Diagnosis not present

## 2017-06-16 DIAGNOSIS — E039 Hypothyroidism, unspecified: Secondary | ICD-10-CM | POA: Diagnosis not present

## 2017-06-16 DIAGNOSIS — T783XXA Angioneurotic edema, initial encounter: Secondary | ICD-10-CM | POA: Insufficient documentation

## 2017-06-16 DIAGNOSIS — Z7982 Long term (current) use of aspirin: Secondary | ICD-10-CM | POA: Diagnosis not present

## 2017-06-16 DIAGNOSIS — Z79899 Other long term (current) drug therapy: Secondary | ICD-10-CM | POA: Diagnosis not present

## 2017-06-16 DIAGNOSIS — I10 Essential (primary) hypertension: Secondary | ICD-10-CM | POA: Insufficient documentation

## 2017-06-16 DIAGNOSIS — I2510 Atherosclerotic heart disease of native coronary artery without angina pectoris: Secondary | ICD-10-CM | POA: Diagnosis not present

## 2017-06-16 MED ORDER — DIPHENHYDRAMINE HCL 50 MG/ML IJ SOLN
50.0000 mg | Freq: Once | INTRAMUSCULAR | Status: AC
  Administered 2017-06-16: 50 mg via INTRAVENOUS

## 2017-06-16 MED ORDER — METHYLPREDNISOLONE SODIUM SUCC 125 MG IJ SOLR
INTRAMUSCULAR | Status: AC
  Administered 2017-06-16: 125 mg via INTRAVENOUS
  Filled 2017-06-16: qty 2

## 2017-06-16 MED ORDER — METHYLPREDNISOLONE SODIUM SUCC 125 MG IJ SOLR
125.0000 mg | Freq: Once | INTRAMUSCULAR | Status: AC
  Administered 2017-06-16: 125 mg via INTRAVENOUS

## 2017-06-16 MED ORDER — FAMOTIDINE IN NACL 20-0.9 MG/50ML-% IV SOLN
20.0000 mg | Freq: Once | INTRAVENOUS | Status: AC
  Administered 2017-06-16: 20 mg via INTRAVENOUS

## 2017-06-16 MED ORDER — FAMOTIDINE IN NACL 20-0.9 MG/50ML-% IV SOLN
INTRAVENOUS | Status: AC
  Administered 2017-06-16: 20 mg via INTRAVENOUS
  Filled 2017-06-16: qty 50

## 2017-06-16 MED ORDER — DIPHENHYDRAMINE HCL 50 MG/ML IJ SOLN
INTRAMUSCULAR | Status: AC
  Administered 2017-06-16: 50 mg via INTRAVENOUS
  Filled 2017-06-16: qty 1

## 2017-06-16 NOTE — ED Triage Notes (Signed)
Pt presents to ED with upper lip swelling after he got home from eating BBQ this afternoon. Pt does take Lisinopril. denies pain or difficulty breathing.

## 2017-06-16 NOTE — ED Notes (Signed)
Dr Archie Balboa in to follow up with pt

## 2017-06-17 MED ORDER — PREDNISONE 20 MG PO TABS: 40.0000 mg | ORAL_TABLET | Freq: Every day | ORAL | 0 refills | Status: DC

## 2017-06-17 NOTE — ED Provider Notes (Signed)
[facility] Emergency Department Provider Note   ____________________________________________   I have reviewed the triage vital signs and the nursing notes.   HISTORY  Chief Complaint Facial Swelling   History limited by: Not Limited   HPI Phillip [name] is a 76 y.o. male who presents to the emergency department today for facial swelling   LOCATION:upper lip DURATION:started today TIMING: has been constant since it started QUALITY: swelling, fullness CONTEXT: patient is on lisinopril. States had a bbq sandwich shortly before the swelling started. Has never had swelling like this before. MODIFYING FACTORS: none ASSOCIATED SYMPTOMS: denies any difficulty with breathing or swallowing. Denies any tongue swelling. No shortness of breath. No rash.  Per medical record review patient has a history of HTN on lisinopril.  Past Medical History:  Diagnosis Date  . Anxiety   . Coronary artery disease 2003   nonbstructive  . Diarrhea   . Hearing loss   . History of benign prostatic hypertrophy   . History of radiation therapy 01/31/10-03/22/10   right tonsil/right neck node 7000 cGy 35 sessions, high risk lymph node volume 5940 cGy 35 sessions, low risk lymph node vol 5600 cGy 35 sessions  . 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 HTN (hypertension)   . Hyperlipidemia    diet controlled- taking medication d/t ensure drinking  . Hypothyroid 01/16/2013  . Neck pain   . Sinus bradycardia   . Squamous cell carcinoma    right tonsil- 2011  . Thrombocytopenia ([place]) 02/02/2012   unclear etiology  . Tinnitus     Patient Active Problem List   Diagnosis Date Noted  . Adverse effect of radiation 12/05/2016  . Advanced care planning/counseling discussion 11/06/2016  . Systolic murmur 76/16/0737  . Hearing loss 02/03/2016  . Hypertriglyceridemia 10/24/2015  . Prediabetes 08/20/2015  . Neck swelling 01/05/2014  . Right hip pain 12/15/2013  . Nail fungal infection 12/15/2013   . Right leg pain 11/05/2013  . Impotence, organic 11/04/2013  . Bilateral carotid artery disease ([place]) 11/04/2013  . Hypothyroid 01/16/2013  . Thrombocytopenia ([place]) 02/02/2012  . Health care maintenance 09/21/2011  . Decreased libido 06/23/2010  . Malignant neoplasm of tonsil ([place]) 03/28/2010  . DIARRHEA 10/29/2009  . SINUS BRADYCARDIA 09/04/2008  . Essential hypertension 06/24/2007  . CAD (coronary artery disease) 06/24/2007  . NECK PAIN 06/24/2007  . BPH associated with nocturia 06/24/2007    Past Surgical History:  Procedure Laterality Date  . CARDIAC CATHETERIZATION  2003, 2005   40% blockage, two 30% blockages treated medically Ron [facility])  . COLONOSCOPY  03/2015   TA x3, HP, melanosis coli, severe diverticulosis, rpt 3 yrs ([place])  . DENTAL SURGERY     extractions  . KNEE ARTHROSCOPY Left   . PEG PLACEMENT  02/2010  . TONSILLECTOMY    . VASECTOMY      Prior to Admission medications   Medication Sig Start Date End Date Taking? Authorizing Provider  aspirin 81 MG tablet Take 81 mg by mouth daily.      [provider]  b complex vitamins tablet Take 1 tablet by mouth daily.    [provider]  cholecalciferol (VITAMIN D) 1000 units tablet Take 1,000 Units by mouth daily.    [provider]  docusate sodium (STOOL SOFTENER) 250 MG capsule Take 250 mg by mouth daily.    [provider]  ENSURE PLUS (ENSURE PLUS) LIQD 1 can 8 x a day 08/28/14   Carollee Herter, Alferd Apa, DO  fenofibrate 160 MG tablet Take 1 tablet (160 mg total) by mouth daily. 03/16/17   [name], MD  Flaxseed, Linseed, (FLAX SEEDS PO) Take by mouth daily.    [provider]  fluticasone Asencion Islam) 50 MCG/ACT nasal spray Reported on 02/03/2016 10/31/12   [provider]  lisinopril (PRINIVIL,ZESTRIL) 40 MG tablet Take 1 tablet (40 mg total) by mouth daily. 10/24/15   Ann Held, DO  Magnesium 200 MG TABS Take by mouth daily.    [provider]  Melatonin 3 MG CAPS Take 1 capsule by mouth at bedtime as needed.     [provider]  Multiple Vitamin (MULTIVITAMIN) tablet Take 1 tablet by mouth daily.    [provider]  POTASSIUM GLUCONATE PO Take by mouth.    [provider]  predniSONE (DELTASONE) 20 MG tablet Take 2 tablets (40 mg total) by mouth daily. 06/17/17   Nance Pear, MD  SF 5000 PLUS 1.1 % CREA dental cream  04/06/14   [provider]  tamsulosin (FLOMAX) 0.4 MG CAPS capsule Take 1 capsule (0.4 mg total) by mouth daily after supper. 05/01/17   [name], MD  vitamin C (ASCORBIC ACID) 500 MG tablet Take 500 mg by mouth daily.    [provider]  vitamin E 400 UNIT capsule Take 400 Units by mouth daily.    [provider]  zaleplon (SONATA) 10 MG capsule Take 1 capsule (10 mg total) by mouth as needed for sleep. 02/29/16   [name], MD    Allergies Morphine and related  Family History  Problem Relation Age of Onset  . Coronary artery disease Father   . Heart attack Father   . Heart disease Father   . Colon cancer Neg Hx   . Esophageal cancer Neg Hx   . Rectal cancer Neg Hx   . Stomach cancer Neg Hx     Social History Social History  Substance Use Topics  . Smoking status: Never Smoker  . Smokeless tobacco: Never Used  . Alcohol use No    Review of Systems Constitutional: No fever/chills Eyes: No visual changes. ENT: Upper lip swelling. Cardiovascular: Denies chest pain. Respiratory: Denies shortness of breath. Gastrointestinal: No abdominal pain.  No nausea, no vomiting.  No diarrhea.   Genitourinary: Frequent urination. Musculoskeletal: Negative for back pain. Skin: Negative for rash. Neurological: Negative for headaches, focal weakness or numbness.  ____________________________________________   PHYSICAL EXAM:  VITAL SIGNS: ED Triage Vitals  Enc Vitals Group     BP 06/16/17 1957 (!) 202/107     Pulse Rate 06/16/17  1957 88     Resp 06/16/17 1957 18     Temp 06/16/17 1957 98 F (36.7 C)     Temp Source 06/16/17 1957 Oral     SpO2 06/16/17 1957 98 %     Weight 06/16/17 1958 205 lb (93 kg)     Height 06/16/17 1958 6\' 3"  (1.905 m)     Head Circumference --      Peak Flow --      Pain Score 06/16/17 1950 0   Constitutional: Alert and oriented. Well appearing and in no distress. Eyes: Conjunctivae are normal.  ENT   Head: Normocephalic and atraumatic.   Nose: No congestion/rhinnorhea.   Mouth/Throat: Mucous membranes are moist. No tongue swelling. Upper lip with notable swelling.   Neck: No stridor. Hematological/Lymphatic/Immunilogical: No cervical lymphadenopathy. Cardiovascular: Normal rate, regular rhythm.  Systolic murmur. Respiratory: Normal respiratory effort without  tachypnea nor retractions. Breath sounds are clear and equal bilaterally. No wheezes/rales/rhonchi. Gastrointestinal: Soft and non tender. No rebound. No guarding.  Genitourinary: Deferred Musculoskeletal: Normal range of motion in all extremities. Neurologic:  Normal speech and language. No gross focal neurologic deficits are appreciated.  Skin:  Skin is warm, dry and intact. No rash noted. Psychiatric: Mood and affect are normal. Speech and behavior are normal. Patient exhibits appropriate insight and judgment.  ____________________________________________    LABS (pertinent positives/negatives)  None  ____________________________________________   EKG  None  ____________________________________________    RADIOLOGY  None ____________________________________________   PROCEDURES  Procedures  ____________________________________________   INITIAL IMPRESSION / ASSESSMENT AND PLAN / ED COURSE  Pertinent labs & imaging results that were available during my care of the patient were reviewed by me and considered in my medical decision making (see chart for details).  Patient presented to the  emergency department today because of concerns for swelling of the upper lip.  Differential echo allergy versus angioedema as the 2 most likely etiologies.  Patient is on lisinopril.  No intraoral swelling or tongue swelling.  Patient not complaining of any difficulty with breathing or swallowing.  Patient was observed in the emergency department for 4 hours.  Additionally had been given some medication.  At the time of discharge the lip swelling had improved he had a slightly more fullness in the cheeks however continued to deny any tongue swelling.  At this point I did offer to continue to observe patient however I do feel comfortable discharging given her worsening symptoms.  Will provide patient with prescription for prednisone.  I did discuss with patient that he should stop taking lisinopril.  I stressed to him that further lisinopril could potentially cause worsening swelling and tongue swelling and potential death.  Patient will follow up with his primary care doctor for new blood pressure medication.   ____________________________________________   FINAL CLINICAL IMPRESSION(S) / ED DIAGNOSES  Final diagnoses:  Angioedema, initial encounter     Note: This dictation was prepared with Dragon dictation. Any transcriptional errors that result from this process are unintentional     Nance Pear, MD 06/17/17 [phone]

## 2017-06-17 NOTE — Discharge Instructions (Signed)
As discussed please stop taking your lisinopril. Talk to your primary care doctor about a new blood pressure medication. Please seek medical attention for any severe headache, throat or tongue swelling, difficulty breathing or any other new or concerning symptoms.

## 2017-06-17 NOTE — ED Notes (Signed)
In to meet pt; pt is slightly agitated and wanting to speak with MD; has questions about what his discharge instructions will be and medications he will be prescribed; will notify Dr Archie Balboa that pt is wanting to speak with him

## 2017-06-18 ENCOUNTER — Ambulatory Visit: Payer: Self-pay | Admitting: Family Medicine

## 2017-06-19 ENCOUNTER — Encounter: Payer: Self-pay | Admitting: Family Medicine

## 2017-06-19 ENCOUNTER — Ambulatory Visit (INDEPENDENT_AMBULATORY_CARE_PROVIDER_SITE_OTHER): Payer: PPO | Admitting: Family Medicine

## 2017-06-19 VITALS — BP 130/72 | HR 77 | Temp 97.4°F | Ht 72.5 in | Wt 208.8 lb

## 2017-06-19 DIAGNOSIS — T783XXS Angioneurotic edema, sequela: Secondary | ICD-10-CM

## 2017-06-19 DIAGNOSIS — I10 Essential (primary) hypertension: Secondary | ICD-10-CM | POA: Diagnosis not present

## 2017-06-19 DIAGNOSIS — T783XXA Angioneurotic edema, initial encounter: Secondary | ICD-10-CM | POA: Insufficient documentation

## 2017-06-19 DIAGNOSIS — F430 Acute stress reaction: Secondary | ICD-10-CM | POA: Diagnosis not present

## 2017-06-19 MED ORDER — EPINEPHRINE 0.3 MG/0.3ML IJ SOAJ: 0.3000 mg | Freq: Once | INTRAMUSCULAR | 0 refills | Status: AC

## 2017-06-19 NOTE — Patient Instructions (Signed)
Carry your epi pen- if you develop a reaction with swelling of the mouth or throat- use it and call for medical care   Alert [place] if you have any more problems  Take care of yourself   Blood pressure is good- let's watch it for now  Follow up with Dr Darnell Level in 1-2 months   Stay off the lisinopril   We will refer you to a counselor as well- our office will call you

## 2017-06-19 NOTE — Progress Notes (Signed)
Subjective:    Patient ID: Phillip [name], male    DOB: [birth year], 76 y.o.   MRN: [id number]  HPI 76 yo pt of Dr Darnell Level here for hospital f/u  He has a past hx of CAD/carotid dz, HTN and tonsil malignancy, prediabetes and hypothyroidism  He was seen in the ED on 10/28 for angioedema (facial swelling)  He c/o of swelling of upper lip  Ate a bbq sandwich shortly before it occurred   (outdoors in the cold in [place]) (he does not usually eat much of this since his tonsil cancer)  Takes lisinopril  No sob/bronchospasm or tongue/throat swelling   At presentation bp was 202/107 with pulse of 88 and pulse ox of 98%   He was obs in ED for 4 hours  Given benadryl and steroid IV (solu medrol) Lip swelling improved   He was given prednisone px  Told to hold the lisinopril   He takes no other bp med   Today- back to normal  Swelling started to go down on Sunday evening  He never felt "sick" and feels fine  Prednisone - is making him hungry    BP Readings from Last 3 Encounters:  06/19/17 136/74  06/17/17 (!) 145/81  05/01/17 124/78   Pulse Readings from Last 3 Encounters:  06/19/17 77  06/17/17 78  05/01/17 78   Wt Readings from Last 3 Encounters:  06/19/17 208 lb 12 oz (94.7 kg)  06/16/17 205 lb (93 kg)  05/01/17 207 lb (93.9 kg)   Has lost wt  27.92 kg/m  Wt was 216 in 2016  Voices concern over mood Gets very upset about the state of the world and other things Worries about bp going up and down with this  2nd bp today 130/72   Patient Active Problem List   Diagnosis Date Noted  . Angioedema 06/19/2017  . Stress reaction 06/19/2017  . Adverse effect of radiation 12/05/2016  . Advanced care planning/counseling discussion 11/06/2016  . Systolic murmur 77/64/3329  . Hearing loss 02/03/2016  . Hypertriglyceridemia 10/24/2015  . Prediabetes 08/20/2015  . Neck swelling 01/05/2014  . Right hip pain 12/15/2013  . Nail fungal infection 12/15/2013  . Right leg pain  11/05/2013  . Impotence, organic 11/04/2013  . Bilateral carotid artery disease ([place]) 11/04/2013  . Hypothyroid 01/16/2013  . Thrombocytopenia ([place]) 02/02/2012  . Health care maintenance 09/21/2011  . Decreased libido 06/23/2010  . Malignant neoplasm of tonsil ([place]) 03/28/2010  . DIARRHEA 10/29/2009  . SINUS BRADYCARDIA 09/04/2008  . Essential hypertension 06/24/2007  . CAD (coronary artery disease) 06/24/2007  . NECK PAIN 06/24/2007  . BPH associated with nocturia 06/24/2007   Past Medical History:  Diagnosis Date  . Anxiety   . Coronary artery disease 2003   nonbstructive  . Diarrhea   . Hearing loss   . History of benign prostatic hypertrophy   . History of radiation therapy 01/31/10-03/22/10   right tonsil/right neck node 7000 cGy 35 sessions, high risk lymph node volume 5940 cGy 35 sessions, low risk lymph node vol 5600 cGy 35 sessions  . 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 HTN (hypertension)   . Hyperlipidemia    diet controlled- taking medication d/t ensure drinking  . Hypothyroid 01/16/2013  . Neck pain   . Sinus bradycardia   . Squamous cell carcinoma    right tonsil- 2011  . Thrombocytopenia ([place]) 02/02/2012   unclear etiology  . Tinnitus    Past Surgical History:  Procedure  Laterality Date  . CARDIAC CATHETERIZATION  2003, 2005   40% blockage, two 30% blockages treated medically [facility])  . COLONOSCOPY  03/2015   TA x3, HP, melanosis coli, severe diverticulosis, rpt 3 yrs ([facility])  . DENTAL SURGERY     extractions  . KNEE ARTHROSCOPY Left   . PEG PLACEMENT  02/2010  . TONSILLECTOMY    . VASECTOMY     Social History  Substance Use Topics  . Smoking status: Never Smoker  . Smokeless tobacco: Never Used  . Alcohol use No   Family History  Problem Relation Age of Onset  . Coronary artery disease Father   . Heart attack Father   . Heart disease Father   . Colon cancer Neg Hx   . Esophageal cancer Neg Hx   . Rectal cancer Neg Hx   . Stomach cancer Neg Hx     Allergies  Allergen Reactions  . Morphine And Related Swelling   Current Outpatient Prescriptions on File Prior to Visit  Medication Sig Dispense Refill  . aspirin 81 MG tablet Take 81 mg by mouth daily.      Marland Kitchen b complex vitamins tablet Take 1 tablet by mouth daily.    . cholecalciferol (VITAMIN D) 1000 units tablet Take 1,000 Units by mouth daily.    Marland Kitchen docusate sodium (STOOL SOFTENER) 250 MG capsule Take 250 mg by mouth daily.    Marland Kitchen ENSURE PLUS (ENSURE PLUS) LIQD 1 can 8 x a day 240 Can 11  . fenofibrate 160 MG tablet Take 1 tablet (160 mg total) by mouth daily. 90 tablet 2  . Flaxseed, Linseed, (FLAX SEEDS PO) Take by mouth daily.    . fluticasone (FLONASE) 50 MCG/ACT nasal spray Reported on 02/03/2016    . Magnesium 200 MG TABS Take by mouth daily.    . Melatonin 3 MG CAPS Take 1 capsule by mouth at bedtime as needed.     . Multiple Vitamin (MULTIVITAMIN) tablet Take 1 tablet by mouth daily.    Marland Kitchen POTASSIUM GLUCONATE PO Take by mouth.    . predniSONE (DELTASONE) 20 MG tablet Take 2 tablets (40 mg total) by mouth daily. 8 tablet 0  . SF 5000 PLUS 1.1 % CREA dental cream     . tamsulosin (FLOMAX) 0.4 MG CAPS capsule Take 1 capsule (0.4 mg total) by mouth daily after supper. 30 capsule 3  . vitamin C (ASCORBIC ACID) 500 MG tablet Take 500 mg by mouth daily.    . vitamin E 400 UNIT capsule Take 400 Units by mouth daily.    . zaleplon (SONATA) 10 MG capsule Take 1 capsule (10 mg total) by mouth as needed for sleep. 90 capsule 0  . lisinopril (PRINIVIL,ZESTRIL) 40 MG tablet Take 1 tablet (40 mg total) by mouth daily. (Patient not taking: Reported on 06/19/2017) 90 tablet 3   No current facility-administered medications on file prior to visit.      Review of Systems  Constitutional: Negative for activity change, appetite change, fatigue, fever and unexpected weight change.  HENT: Negative for congestion, rhinorrhea, sore throat and trouble swallowing.   Eyes: Negative for pain, redness,  itching and visual disturbance.  Respiratory: Negative for cough, chest tightness, shortness of breath and wheezing.   Cardiovascular: Negative for chest pain and palpitations.  Gastrointestinal: Negative for abdominal pain, blood in stool, constipation, diarrhea and nausea.  Endocrine: Negative for cold intolerance, heat intolerance, polydipsia and polyuria.  Genitourinary: Negative for difficulty urinating, dysuria, frequency and urgency.  Musculoskeletal: Negative for arthralgias, joint swelling and myalgias.  Skin: Negative for pallor and rash.  Neurological: Negative for dizziness, tremors, weakness, numbness and headaches.  Hematological: Negative for adenopathy. Does not bruise/bleed easily.  Psychiatric/Behavioral: Positive for agitation. Negative for decreased concentration, dysphoric mood, sleep disturbance and suicidal ideas. The patient is nervous/anxious.        Objective:   Physical Exam  Constitutional: He appears well-developed and well-nourished. No distress.  Well appearing elderly male   HENT:  Head: Normocephalic and atraumatic.  Mouth/Throat: Oropharynx is clear and moist. No oropharyngeal exudate.  Wearing hearing aides  No facial/mouth or tongue swelling  Throat is clear   Eyes: Pupils are equal, round, and reactive to light. Conjunctivae and EOM are normal.  Neck: Normal range of motion. Neck supple. No JVD present. Carotid bruit is not present. No thyromegaly present.  Cardiovascular: Normal rate, regular rhythm and intact distal pulses.  Exam reveals no gallop.   Murmur heard. Pulmonary/Chest: Effort normal and breath sounds normal. No respiratory distress. He has no wheezes. He has no rales.  No crackles  Abdominal: Soft. Bowel sounds are normal. He exhibits no distension, no abdominal bruit and no mass. There is no tenderness.  Musculoskeletal: He exhibits no edema or tenderness.  Lymphadenopathy:    He has no cervical adenopathy.  Neurological: He is  alert. He has normal reflexes. No cranial nerve deficit. He exhibits normal muscle tone. Coordination normal.  Skin: Skin is warm and dry. No rash noted. No erythema. No pallor.  No urticaria or other rash   Psychiatric: His mood appears anxious. His affect is labile. His affect is not blunt. His speech is not rapid and/or pressured. He is not slowed and not withdrawn. Cognition and memory are normal. He expresses no homicidal and no suicidal ideation.  Pt is anxious and tangential in thought- at times hard to follow  No pressured speech Not tearful  Voices distressed mood however - and somewhat labile throughout interview           Assessment & Plan:   Problem List Items Addressed This Visit      Cardiovascular and Mediastinum   Essential hypertension    Pt stopped lisinopril due to episode of angioedema (? If the cause or not ) Adv not to re start it  BP: 130/72  bp is well controlled at this time w/o medication (poss due to wt loss and lifestyle change)  F/u with pcp in 1-2 mo or earlier if needed       Relevant Medications   EPINEPHrine 0.3 mg/0.3 mL IJ SOAJ injection     Other   Angioedema - Primary    Seen ED for upper lip and facial swelling Reviewed hospital records, lab results and studies in detail   Unsure of trigger  Ace discontinued  Px epi pen today -disc method of use  If another episode occurs-may need allergy referral  Nl exam today  No hx of bronchospasm      Stress reaction    Pt is exp anxiety regarding "state of the world" over which he has no control - he discussed this for 30 minutes very passionately and was tangential in thought  Reviewed stressors/ coping techniques/symptoms/ support sources/ tx options and side effects in detail today  The prednisone may exacerbate anxiety - disc this  He desires counseling - referral made        Relevant Orders   Ambulatory referral to Psychology

## 2017-06-19 NOTE — Assessment & Plan Note (Signed)
Pt is exp anxiety regarding "state of the world" over which he has no control - he discussed this for 30 minutes very passionately and was tangential in thought  Reviewed stressors/ coping techniques/symptoms/ support sources/ tx options and side effects in detail today  The prednisone may exacerbate anxiety - disc this  He desires counseling - referral made

## 2017-06-19 NOTE — Assessment & Plan Note (Signed)
Seen ED for upper lip and facial swelling Reviewed hospital records, lab results and studies in detail   Unsure of trigger  Ace discontinued  Px epi pen today -disc method of use  If another episode occurs-may need allergy referral  Nl exam today  No hx of bronchospasm

## 2017-06-19 NOTE — Assessment & Plan Note (Signed)
Pt stopped lisinopril due to episode of angioedema (? If the cause or not ) Adv not to re start it  BP: 130/72  bp is well controlled at this time w/o medication (poss due to wt loss and lifestyle change)  F/u with pcp in 1-2 mo or earlier if needed

## 2017-07-09 DIAGNOSIS — L570 Actinic keratosis: Secondary | ICD-10-CM | POA: Diagnosis not present

## 2017-07-09 DIAGNOSIS — D229 Melanocytic nevi, unspecified: Secondary | ICD-10-CM | POA: Diagnosis not present

## 2017-07-09 DIAGNOSIS — B351 Tinea unguium: Secondary | ICD-10-CM | POA: Diagnosis not present

## 2017-07-09 DIAGNOSIS — L812 Freckles: Secondary | ICD-10-CM | POA: Diagnosis not present

## 2017-07-09 DIAGNOSIS — D1801 Hemangioma of skin and subcutaneous tissue: Secondary | ICD-10-CM | POA: Diagnosis not present

## 2017-07-09 DIAGNOSIS — L578 Other skin changes due to chronic exposure to nonionizing radiation: Secondary | ICD-10-CM | POA: Diagnosis not present

## 2017-07-09 DIAGNOSIS — L821 Other seborrheic keratosis: Secondary | ICD-10-CM | POA: Diagnosis not present

## 2017-07-09 DIAGNOSIS — Z1283 Encounter for screening for malignant neoplasm of skin: Secondary | ICD-10-CM | POA: Diagnosis not present

## 2017-07-09 DIAGNOSIS — L820 Inflamed seborrheic keratosis: Secondary | ICD-10-CM | POA: Diagnosis not present

## 2017-07-09 DIAGNOSIS — D692 Other nonthrombocytopenic purpura: Secondary | ICD-10-CM | POA: Diagnosis not present

## 2017-09-05 ENCOUNTER — Ambulatory Visit (INDEPENDENT_AMBULATORY_CARE_PROVIDER_SITE_OTHER): Payer: PPO | Admitting: Psychology

## 2017-09-05 DIAGNOSIS — F4321 Adjustment disorder with depressed mood: Secondary | ICD-10-CM | POA: Diagnosis not present

## 2017-09-06 ENCOUNTER — Encounter: Payer: Self-pay | Admitting: Family Medicine

## 2017-09-06 ENCOUNTER — Ambulatory Visit (INDEPENDENT_AMBULATORY_CARE_PROVIDER_SITE_OTHER): Payer: PPO | Admitting: Family Medicine

## 2017-09-06 VITALS — BP 160/82 | HR 66 | Temp 97.9°F | Wt 210.0 lb

## 2017-09-06 DIAGNOSIS — I2510 Atherosclerotic heart disease of native coronary artery without angina pectoris: Secondary | ICD-10-CM

## 2017-09-06 DIAGNOSIS — F430 Acute stress reaction: Secondary | ICD-10-CM | POA: Diagnosis not present

## 2017-09-06 DIAGNOSIS — N401 Enlarged prostate with lower urinary tract symptoms: Secondary | ICD-10-CM | POA: Diagnosis not present

## 2017-09-06 DIAGNOSIS — I10 Essential (primary) hypertension: Secondary | ICD-10-CM

## 2017-09-06 DIAGNOSIS — R011 Cardiac murmur, unspecified: Secondary | ICD-10-CM

## 2017-09-06 DIAGNOSIS — R351 Nocturia: Secondary | ICD-10-CM

## 2017-09-06 DIAGNOSIS — R9431 Abnormal electrocardiogram [ECG] [EKG]: Secondary | ICD-10-CM

## 2017-09-06 MED ORDER — AMLODIPINE BESYLATE 5 MG PO TABS: 5.0000 mg | ORAL_TABLET | Freq: Every day | ORAL | 1 refills | Status: DC

## 2017-09-06 NOTE — Assessment & Plan Note (Addendum)
Known BPH with nocturia and also with daytime urinary urgency. Has tried and failed avodart, tamsulosin, and myrbetriq. Will refer back to urology - previously saw Dr Risa Grill

## 2017-09-06 NOTE — Patient Instructions (Addendum)
EKG today We will refer you back to urology Start amlodipine 5mg  daily for blood pressure control.  Good to see you today, call us with questions. Keep appointment for physical in March.

## 2017-09-06 NOTE — Assessment & Plan Note (Addendum)
Chronic. bp creeping up off lisinopril (prior 40mg  daily).  bp remaining elevated in office. Will start amlodipine 5mg  daily. Reviewed common side effects to monitor for. Reassess at CPE in 2 months. Pt agrees with plan.  EKG - abnormal, with some ST/T changes compared to prior. Denies symptoms. Will discuss with patient and refer to cardiology if he agrees. Continue aspirin

## 2017-09-06 NOTE — Assessment & Plan Note (Signed)
H/o this. Managed medically. Has not seen cards recently. Update EKG today

## 2017-09-06 NOTE — Assessment & Plan Note (Signed)
Mild, stable. Continue to monitor.

## 2017-09-06 NOTE — Assessment & Plan Note (Signed)
Has established with [facility] counselor. Considering return PRN.

## 2017-09-06 NOTE — Progress Notes (Addendum)
BP (!) 160/82 (BP Location: Right Arm, Cuff Size: Large)   Pulse 66   Temp 97.9 F (36.6 C) (Oral)   Wt 210 lb (95.3 kg)   SpO2 100%   BMI 28.09 kg/m    CC: discuss meds Subjective:    Patient ID: Phillip [name], male    DOB: [birth year], 77 y.o.   MRN: [id number]  HPI: Phillip [name] is a 77 y.o. male presenting on 09/06/2017 for Discuss medications (Wants to discuss lisinopril and Flonase)   Recent ER visit with upper lip swelling thought angioedema to lisinopril. Treated with benadryl and solumedrol IV then prednisone course. Lisinopril discontinued. Never dyspnea, bronchospasm or tongue/throat swelling. Seen Dr Glori Bickers in follow up - prescribed epi pen.   BP has slowly been creeping up.  Denies HA, vision changes, CP/tightness, SOB, leg swelling.   Referred to counseling for ongoing stress and anxiety. Saw [facility] yesterday. May return for f/u if needed.   Ongoing nocturia x4, daytime urgency. Has seen urology. Requests return to urology - previously saw Dr Risa Grill. Flomax was ineffective.   Relevant past medical, surgical, family and social history reviewed and updated as indicated. Interim medical history since our last visit reviewed. Allergies and medications reviewed and updated. Outpatient Medications Prior to Visit  Medication Sig Dispense Refill  . aspirin 81 MG tablet Take 81 mg by mouth daily.      Marland Kitchen b complex vitamins tablet Take 1 tablet by mouth daily.    . cholecalciferol (VITAMIN D) 1000 units tablet Take 1,000 Units by mouth daily.    Marland Kitchen docusate sodium (STOOL SOFTENER) 250 MG capsule Take 250 mg by mouth daily.    Marland Kitchen ENSURE PLUS (ENSURE PLUS) LIQD 1 can 8 x a day 240 Can 11  . fenofibrate 160 MG tablet Take 1 tablet (160 mg total) by mouth daily. 90 tablet 2  . Flaxseed, Linseed, (FLAX SEEDS PO) Take by mouth daily.    . Magnesium 200 MG TABS Take by mouth daily.    . Melatonin 3 MG CAPS Take 1 capsule by mouth at bedtime as needed.     . Multiple Vitamin  (MULTIVITAMIN) tablet Take 1 tablet by mouth daily.    Marland Kitchen POTASSIUM GLUCONATE PO Take by mouth.    . vitamin C (ASCORBIC ACID) 500 MG tablet Take 500 mg by mouth daily.    . vitamin E 400 UNIT capsule Take 400 Units by mouth daily.    . zaleplon (SONATA) 10 MG capsule Take 1 capsule (10 mg total) by mouth as needed for sleep. 90 capsule 0  . fluticasone (FLONASE) 50 MCG/ACT nasal spray Reported on 02/03/2016    . lisinopril (PRINIVIL,ZESTRIL) 40 MG tablet Take 1 tablet (40 mg total) by mouth daily. (Patient not taking: Reported on 06/19/2017) 90 tablet 3  . predniSONE (DELTASONE) 20 MG tablet Take 2 tablets (40 mg total) by mouth daily. 8 tablet 0  . SF 5000 PLUS 1.1 % CREA dental cream     . tamsulosin (FLOMAX) 0.4 MG CAPS capsule Take 1 capsule (0.4 mg total) by mouth daily after supper. 30 capsule 3   No facility-administered medications prior to visit.      Per HPI unless specifically indicated in ROS section below Review of Systems     Objective:    BP (!) 160/82 (BP Location: Right Arm, Cuff Size: Large)   Pulse 66   Temp 97.9 F (36.6 C) (Oral)   Wt 210 lb (95.3 kg)   SpO2 100%  BMI 28.09 kg/m   Wt Readings from Last 3 Encounters:  09/06/17 210 lb (95.3 kg)  06/19/17 208 lb 12 oz (94.7 kg)  06/16/17 205 lb (93 kg)    Physical Exam  Constitutional: He appears well-developed and well-nourished. No distress.  HENT:  Mouth/Throat: Oropharynx is clear and moist. No oropharyngeal exudate.  Neck:  Chronic woody consistency of neck  Cardiovascular: Normal rate, regular rhythm and intact distal pulses.  Murmur (3/6 systolic at USB) heard. Pulmonary/Chest: Effort normal and breath sounds normal. No respiratory distress. He has no wheezes. He has no rales.  Musculoskeletal: He exhibits no edema.  Skin: Skin is warm and dry. No rash noted.  Psychiatric: He has a normal mood and affect.  Nursing note and vitals reviewed.  Results for orders placed or performed in visit on  04/27/17  TSH  Result Value Ref Range   TSH 4.28 0.35 - 4.50 uIU/mL  T4, free  Result Value Ref Range   Free T4 0.91 0.60 - 1.60 ng/dL  Basic metabolic panel  Result Value Ref Range   Sodium 142 135 - 145 mEq/L   Potassium 4.8 3.5 - 5.1 mEq/L   Chloride 107 96 - 112 mEq/L   CO2 30 19 - 32 mEq/L   Glucose, Bld 124 (H) 70 - 99 mg/dL   BUN 23 6 - 23 mg/dL   Creatinine, Ser 1.17 0.40 - 1.50 mg/dL   Calcium 10.2 8.4 - 10.5 mg/dL   GFR 64.15 >60.00 mL/min  Hemoglobin A1c  Result Value Ref Range   Hgb A1c MFr Bld 5.8 4.6 - 6.5 %   EKG - NSR rate 60, LAD with LAFB, p mitrale, q waves anteriorly, non acute ST/T changes diffusely anteriolaterally, new from prior.     Assessment & Plan:   Problem List Items Addressed This Visit    Abnormal EKG   BPH associated with nocturia    Known BPH with nocturia and also with daytime urinary urgency. Has tried and failed avodart, tamsulosin, and myrbetriq. Will refer back to urology - previously saw Dr Risa Grill      Relevant Orders   Ambulatory referral to Urology   CAD (coronary artery disease)    H/o this. Managed medically. Has not seen cards recently. Update EKG today      Relevant Medications   amLODipine (NORVASC) 5 MG tablet   Other Relevant Orders   EKG 12-Lead (Completed)   Essential hypertension - Primary    Chronic. bp creeping up off lisinopril (prior 40mg  daily).  bp remaining elevated in office. Will start amlodipine 5mg  daily. Reviewed common side effects to monitor for. Reassess at CPE in 2 months. Pt agrees with plan.  EKG - abnormal, with some ST/T changes compared to prior. Denies symptoms. Will discuss with patient and refer to cardiology if he agrees. Continue aspirin      Relevant Medications   amLODipine (NORVASC) 5 MG tablet   Stress reaction    Has established with [facility] counselor. Considering return PRN.       Systolic murmur    Mild, stable. Continue to monitor.           Follow up plan: No Follow-up  on file.  [name], MD

## 2017-09-09 DIAGNOSIS — R9431 Abnormal electrocardiogram [ECG] [EKG]: Secondary | ICD-10-CM | POA: Insufficient documentation

## 2017-09-09 NOTE — Addendum Note (Signed)
Addended by: [name] on: 09/09/2017 10:32 PM   Modules accepted: Orders

## 2017-09-09 NOTE — Addendum Note (Signed)
Addended by: [name] on: 09/09/2017 10:29 PM   Modules accepted: Orders

## 2017-09-12 DIAGNOSIS — L578 Other skin changes due to chronic exposure to nonionizing radiation: Secondary | ICD-10-CM | POA: Diagnosis not present

## 2017-09-12 DIAGNOSIS — L820 Inflamed seborrheic keratosis: Secondary | ICD-10-CM | POA: Diagnosis not present

## 2017-09-12 DIAGNOSIS — D229 Melanocytic nevi, unspecified: Secondary | ICD-10-CM | POA: Diagnosis not present

## 2017-09-12 DIAGNOSIS — L821 Other seborrheic keratosis: Secondary | ICD-10-CM | POA: Diagnosis not present

## 2017-09-12 DIAGNOSIS — L720 Epidermal cyst: Secondary | ICD-10-CM | POA: Diagnosis not present

## 2017-09-12 DIAGNOSIS — B359 Dermatophytosis, unspecified: Secondary | ICD-10-CM | POA: Diagnosis not present

## 2017-10-05 DIAGNOSIS — N401 Enlarged prostate with lower urinary tract symptoms: Secondary | ICD-10-CM | POA: Diagnosis not present

## 2017-10-05 DIAGNOSIS — R351 Nocturia: Secondary | ICD-10-CM | POA: Diagnosis not present

## 2017-10-15 DIAGNOSIS — R350 Frequency of micturition: Secondary | ICD-10-CM | POA: Diagnosis not present

## 2017-10-15 DIAGNOSIS — N401 Enlarged prostate with lower urinary tract symptoms: Secondary | ICD-10-CM | POA: Diagnosis not present

## 2017-10-15 DIAGNOSIS — R351 Nocturia: Secondary | ICD-10-CM | POA: Diagnosis not present

## 2017-10-17 ENCOUNTER — Ambulatory Visit: Payer: Self-pay | Admitting: Internal Medicine

## 2017-10-22 DIAGNOSIS — R3915 Urgency of urination: Secondary | ICD-10-CM | POA: Diagnosis not present

## 2017-10-22 DIAGNOSIS — R350 Frequency of micturition: Secondary | ICD-10-CM | POA: Diagnosis not present

## 2017-10-22 DIAGNOSIS — N401 Enlarged prostate with lower urinary tract symptoms: Secondary | ICD-10-CM | POA: Diagnosis not present

## 2017-10-24 ENCOUNTER — Ambulatory Visit: Payer: PPO | Admitting: Internal Medicine

## 2017-10-24 ENCOUNTER — Encounter: Payer: Self-pay | Admitting: Internal Medicine

## 2017-10-24 VITALS — BP 154/70 | HR 74 | Ht 74.0 in | Wt 210.0 lb

## 2017-10-24 DIAGNOSIS — R011 Cardiac murmur, unspecified: Secondary | ICD-10-CM | POA: Diagnosis not present

## 2017-10-24 DIAGNOSIS — E782 Mixed hyperlipidemia: Secondary | ICD-10-CM

## 2017-10-24 DIAGNOSIS — I10 Essential (primary) hypertension: Secondary | ICD-10-CM | POA: Diagnosis not present

## 2017-10-24 DIAGNOSIS — I517 Cardiomegaly: Secondary | ICD-10-CM | POA: Insufficient documentation

## 2017-10-24 DIAGNOSIS — I2510 Atherosclerotic heart disease of native coronary artery without angina pectoris: Secondary | ICD-10-CM | POA: Diagnosis not present

## 2017-10-24 MED ORDER — AMLODIPINE BESYLATE 10 MG PO TABS: 10.0000 mg | ORAL_TABLET | Freq: Every day | ORAL | 2 refills | Status: DC

## 2017-10-24 NOTE — Progress Notes (Signed)
New Outpatient Visit Date: 10/24/2017  Referring Provider: [name], MD [address] [place], [place] [address]  Chief Complaint: heart murmur  HPI:  Mr. Matsuo is a 77 y.o. male who is being seen today for the evaluation of coronary artery disease and systolic heart murmur at the request of Dr. Danise Mina. 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 Mr. Copley reports being diagnosed with coronary artery disease in the early 2000's after abnormal "blips" were noted on a stress test. Cardiac catheterization revealed mild to moderate, non-obstructive coronary artery disease. He was enrolled in the ASTEROID study evaluating effects of rosuvastatin on the progression of coronary artery disease. Repeat cardiac catheterization 2 years later showed stable CAD. Mr. Bissonnette was previously followed in our office by Dr. Ron Parker but has not seen a cardiologist for at least 5-10 years.  Mr. Hammond notes that he has been very active all of his life, including playing several sports and competing in marathons. He continues to walk/hike regularly as well as play golf and exercise at the [facility]. He denies chest pain, shortness of breath, palpitations, lightheadedness, orthopnea, PND, and edema. His stamina has decreased slightly over the last 3-4 years, though he attributes this to being less active. Mr. Mcclain notes that a heart murmur has been appreciated on several occasions by Dr. Danise Mina. Mr. Spradley does not recall if an echocardiogram has ever been performed.  --------------------------------------------------------------------------------------------------  Cardiovascular History & Procedures: Cardiovascular Problems:  Coronary artery disease  Heart murmur  Sinus bradycardia  Risk Factors:  Known coronary artery disease, hypertension, hyperlipidemia, male  gender, and age > 43  Cath/PCI:  LHC (06/16/04): LMCA with 20% distal stenosis. LAD with sequential 40% ostial/proximal stenoses followed by a 30% mid vessel lesion. Dominant LCx with 30% ostial stenosis. Normal RCA. LVEF 60%. LVEDP 20 mmHg.  CV Surgery:  None  EP Procedures and Devices:  None  Non-Invasive Evaluation(s):  Carotid Doppler (10/15/14): Moderate bilateral atherosclerotic plaquing without significant stenosis in either carotid artery.  Recent CV Pertinent Labs: Lab Results  Component Value Date   CHOL 139 10/30/2016   HDL 37.20 (L) 10/30/2016   LDLCALC 87 10/30/2016   LDLDIRECT 101.0 10/13/2014   TRIG 74.0 10/30/2016   CHOLHDL 4 10/30/2016   K 4.8 04/27/2017   K 4.2 12/29/2013   MG 1.9 03/14/2010   BUN 23 04/27/2017   BUN 17.2 12/29/2013   CREATININE 1.17 04/27/2017   CREATININE 0.9 12/29/2013    --------------------------------------------------------------------------------------------------  Past Medical History:  Diagnosis Date  . Anxiety   . Coronary artery disease 2003   nonbstructive  . Diarrhea   . Hearing loss   . History of benign prostatic hypertrophy   . History of radiation therapy 01/31/10-03/22/10   right tonsil/right neck node 7000 cGy 35 sessions, high risk lymph node volume 5940 cGy 35 sessions, low risk lymph node vol 5600 cGy 35 sessions  . 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 HTN (hypertension)   . Hyperlipidemia    diet controlled- taking medication d/t ensure drinking  . Hypothyroid 01/16/2013  . Neck pain   . Sinus bradycardia   . Squamous cell carcinoma    right tonsil- 2011  . Thrombocytopenia ([place]) 02/02/2012   unclear etiology  . Tinnitus     Past Surgical History:  Procedure Laterality Date  . CARDIAC CATHETERIZATION  2003, 2005   40% blockage, two 30%  blockages treated medically Ron Parker)  . COLONOSCOPY  03/2015   TA x3, HP, melanosis coli, severe diverticulosis, rpt 3 yrs ([place])  . DENTAL SURGERY     extractions  . KNEE  ARTHROSCOPY Left   . PEG PLACEMENT  02/2010  . TONSILLECTOMY    . VASECTOMY      Current Meds  Medication Sig  . amLODipine (NORVASC) 5 MG tablet Take 1 tablet (5 mg total) by mouth daily.  Marland Kitchen aspirin 81 MG tablet Take 81 mg by mouth daily.    Marland Kitchen b complex vitamins tablet Take 1 tablet by mouth daily.  . cholecalciferol (VITAMIN D) 1000 units tablet Take 1,000 Units by mouth daily.  Marland Kitchen docusate sodium (STOOL SOFTENER) 250 MG capsule Take 250 mg by mouth daily.  . fenofibrate 160 MG tablet Take 1 tablet (160 mg total) by mouth daily.  . Flaxseed, Linseed, (FLAX SEEDS PO) Take by mouth daily.  . fluticasone (FLONASE) 50 MCG/ACT nasal spray Reported on 02/03/2016  . GLUCERNA (GLUCERNA) LIQD Take 1 Can by mouth 3 (three) times daily between meals.  . Magnesium 200 MG TABS Take by mouth daily.  . Melatonin 3 MG CAPS Take 1 capsule by mouth at bedtime as needed.   . mirabegron ER (MYRBETRIQ) 25 MG TB24 tablet Take 25 mg by mouth daily.  . Multiple Vitamin (MULTIVITAMIN) tablet Take 1 tablet by mouth daily.  Marland Kitchen POTASSIUM GLUCONATE PO Take by mouth.  . vitamin C (ASCORBIC ACID) 500 MG tablet Take 500 mg by mouth daily.  . vitamin E 400 UNIT capsule Take 400 Units by mouth daily.  . zaleplon (SONATA) 10 MG capsule Take 1 capsule (10 mg total) by mouth as needed for sleep.    Allergies: Morphine and related and Ace inhibitors  Social History   Socioeconomic History  . Marital status: Married    Spouse name: Not on file  . Number of children: Not on file  . Years of education: Not on file  . Highest education level: Not on file  Social Needs  . Financial resource strain: Not on file  . Food insecurity - worry: Not on file  . Food insecurity - inability: Not on file  . Transportation needs - medical: Not on file  . Transportation needs - non-medical: Not on file  Occupational History  . Not on file  Tobacco Use  . Smoking status: Never Smoker  . Smokeless tobacco: Never Used  Substance  and Sexual Activity  . Alcohol use: No  . Drug use: No  . Sexual activity: Yes  Other Topics Concern  . 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    Plays golf, remains very active with an interest in politics. Jan 2011 moved to area.    Involved in [employer].    He is a runner - has run [employer] and [employer]. S/p torn meniscus.     Family History  Problem Relation Age of Onset  . Coronary artery disease Father   . Heart attack Father 49  . Heart disease Father   . Colon cancer Neg Hx   . Esophageal cancer Neg Hx   . Rectal cancer Neg Hx   . Stomach cancer Neg Hx     Review of Systems: A 12-system review of systems was performed and was  negative except as noted in the HPI.  --------------------------------------------------------------------------------------------------  Physical Exam: BP (!) 154/70 (BP Location: Right Arm, Patient Position: Sitting, Cuff Size: Normal)   Pulse 74   Ht 6\' 2"  (1.88 m)   Wt 210 lb (95.3 kg)   BMI 26.96 kg/m   General:  Overweight, elderly man, seated comfortably in the exam room. HEENT: No conjunctival pallor or scleral icterus. Moist mucous membranes. OP clear. Neck: Supple without lymphadenopathy, thyromegaly, JVD, or HJR. No carotid bruit. Lungs: Normal work of breathing. Clear to auscultation bilaterally without wheezes or crackles. Heart: Regular rate and rhythm with 2/6 systolic murmur loudest at the RUSB. Non-displaced PMI. Abd: Bowel sounds present. Soft, NT/ND without hepatosplenomegaly Ext: No lower extremity edema. Radial, PT, and DP pulses are 2+ bilaterally Skin: Warm and dry without rash. Neuro: CNIII-XII intact. Strength and fine-touch sensation intact in upper and lower extremities bilaterally. Psych: Normal mood and  affect.  EKG:  Normal sinus rhythm with left axis deviation and LVH with abnormal repolarization.  Lab Results  Component Value Date   WBC 4.2 10/30/2016   HGB 15.9 10/30/2016   HCT 46.7 10/30/2016   MCV 95.1 10/30/2016   PLT 156.0 10/30/2016    Lab Results  Component Value Date   NA 142 04/27/2017   K 4.8 04/27/2017   CL 107 04/27/2017   CO2 30 04/27/2017   BUN 23 04/27/2017   CREATININE 1.17 04/27/2017   GLUCOSE 124 (H) 04/27/2017   ALT 20 10/21/2015    Lab Results  Component Value Date   CHOL 139 10/30/2016   HDL 37.20 (L) 10/30/2016   LDLCALC 87 10/30/2016   LDLDIRECT 101.0 10/13/2014   TRIG 74.0 10/30/2016   CHOLHDL 4 10/30/2016    --------------------------------------------------------------------------------------------------  ASSESSMENT AND PLAN: Coronary artery disease without angina Mild to moderate, non-obstructive CAD noted on remote cardiac catheterization. No symptoms to suggest progression of disease. We will continue with medical therapy, including consideration of statin therapy to achieve an LDL < 70. Mr. Grudzien is due for repeat lipid panel later this month as part of his annual physical. This should be readdressed when he sees Dr. Danise Mina or follows up with [place].  Hypertension and left ventricular hypertrophy Blood pressure is suboptimally controlled today, as at several prior visits. LVH also noted on EKG today, which could be related to uncontrolled blood pressure. We have agreed to increase amlodipine to 10 mg daily.  Heart murmur Systolic murmur noted on exam today. We will obtain a transthoracic echocardiogram for further evaluation.  Hyperlipidemia Lipid panel last year notable for LDL of 87 and normal triglycerides. Mr. Zambito is no longer on a statin; he is on fenofibrate. I recommend repeating a fasting lipid panel as part of his upcoming annual physical. If LDL remains above 70, I think it would be best to stop fenofibrate in favor of  adding a statin.  Follow-up: Return to clinic in 3 months.  [name], MD 10/24/2017 2:53 PM

## 2017-10-24 NOTE — Patient Instructions (Signed)
Medication Instructions:  Your physician has recommended you make the following change in your medication:  1- INCREASE amlodipine to 10 mg by mouth once a day.   Labwork: none  Testing/Procedures: Your physician has requested that you have an echocardiogram. Echocardiography is a painless test that uses sound waves to create images of your heart. It provides your doctor with information about the size and shape of your heart and how well your heart's chambers and valves are working. This procedure takes approximately one hour. There are no restrictions for this procedure.    Follow-Up: Your physician recommends that you schedule a follow-up appointment in: 3 MONTHS WITH DR END.   If you need a refill on your cardiac medications before your next appointment, please call your pharmacy.  Echocardiogram An echocardiogram, or echocardiography, uses sound waves (ultrasound) to produce an image of your heart. The echocardiogram is simple, painless, obtained within a short period of time, and offers valuable information to your health care provider. The images from an echocardiogram can provide information such as:  Evidence of coronary artery disease (CAD).  Heart size.  Heart muscle function.  Heart valve function.  Aneurysm detection.  Evidence of a past heart attack.  Fluid buildup around the heart.  Heart muscle thickening.  Assess heart valve function.  Tell a health care provider about:  Any allergies you have.  All medicines you are taking, including vitamins, herbs, eye drops, creams, and over-the-counter medicines.  Any problems you or family members have had with anesthetic medicines.  Any blood disorders you have.  Any surgeries you have had.  Any medical conditions you have.  Whether you are pregnant or may be pregnant. What happens before the procedure? No special preparation is needed. Eat and drink normally. What happens during the procedure?  In  order to produce an image of your heart, gel will be applied to your chest and a wand-like tool (transducer) will be moved over your chest. The gel will help transmit the sound waves from the transducer. The sound waves will harmlessly bounce off your heart to allow the heart images to be captured in real-time motion. These images will then be recorded.  You may need an IV to receive a medicine that improves the quality of the pictures. What happens after the procedure? You may return to your normal schedule including diet, activities, and medicines, unless your health care provider tells you otherwise. This information is not intended to replace advice given to you by your health care provider. Make sure you discuss any questions you have with your health care provider. Document Released: 08/04/2000 Document Revised: 03/25/2016 Document Reviewed: 04/14/2013 Elsevier Interactive Patient Education  2017 [employer].

## 2017-10-31 ENCOUNTER — Encounter: Payer: Self-pay | Admitting: Family Medicine

## 2017-10-31 ENCOUNTER — Other Ambulatory Visit: Payer: Self-pay | Admitting: Internal Medicine

## 2017-10-31 DIAGNOSIS — I517 Cardiomegaly: Secondary | ICD-10-CM

## 2017-10-31 DIAGNOSIS — R011 Cardiac murmur, unspecified: Secondary | ICD-10-CM

## 2017-10-31 DIAGNOSIS — N3281 Overactive bladder: Secondary | ICD-10-CM | POA: Insufficient documentation

## 2017-11-04 ENCOUNTER — Other Ambulatory Visit: Payer: Self-pay | Admitting: Family Medicine

## 2017-11-04 DIAGNOSIS — N401 Enlarged prostate with lower urinary tract symptoms: Secondary | ICD-10-CM

## 2017-11-04 DIAGNOSIS — E039 Hypothyroidism, unspecified: Secondary | ICD-10-CM

## 2017-11-04 DIAGNOSIS — D696 Thrombocytopenia, unspecified: Secondary | ICD-10-CM

## 2017-11-04 DIAGNOSIS — E782 Mixed hyperlipidemia: Secondary | ICD-10-CM

## 2017-11-04 DIAGNOSIS — R351 Nocturia: Secondary | ICD-10-CM

## 2017-11-04 DIAGNOSIS — R7303 Prediabetes: Secondary | ICD-10-CM

## 2017-11-07 ENCOUNTER — Ambulatory Visit (INDEPENDENT_AMBULATORY_CARE_PROVIDER_SITE_OTHER): Payer: PPO

## 2017-11-07 VITALS — BP 138/82 | HR 65 | Temp 98.6°F | Ht 72.5 in | Wt 208.2 lb

## 2017-11-07 DIAGNOSIS — R7303 Prediabetes: Secondary | ICD-10-CM

## 2017-11-07 DIAGNOSIS — N401 Enlarged prostate with lower urinary tract symptoms: Secondary | ICD-10-CM

## 2017-11-07 DIAGNOSIS — D696 Thrombocytopenia, unspecified: Secondary | ICD-10-CM | POA: Diagnosis not present

## 2017-11-07 DIAGNOSIS — E039 Hypothyroidism, unspecified: Secondary | ICD-10-CM | POA: Diagnosis not present

## 2017-11-07 DIAGNOSIS — Z0000 Encounter for general adult medical examination without abnormal findings: Secondary | ICD-10-CM | POA: Diagnosis not present

## 2017-11-07 DIAGNOSIS — R351 Nocturia: Secondary | ICD-10-CM

## 2017-11-07 DIAGNOSIS — E782 Mixed hyperlipidemia: Secondary | ICD-10-CM

## 2017-11-07 LAB — CBC WITH DIFFERENTIAL/PLATELET
BASOS ABS: 0 10*3/uL (ref 0.0–0.1)
Basophils Relative: 0.2 % (ref 0.0–3.0)
EOS ABS: 0.1 10*3/uL (ref 0.0–0.7)
EOS PCT: 1.2 % (ref 0.0–5.0)
HCT: 45.5 % (ref 39.0–52.0)
Hemoglobin: 15.4 g/dL (ref 13.0–17.0)
LYMPHS ABS: 0.5 10*3/uL — AB (ref 0.7–4.0)
Lymphocytes Relative: 12.5 % (ref 12.0–46.0)
MCHC: 33.8 g/dL (ref 30.0–36.0)
MCV: 95.4 fl (ref 78.0–100.0)
MONO ABS: 0.4 10*3/uL (ref 0.1–1.0)
Monocytes Relative: 10.4 % (ref 3.0–12.0)
NEUTROS PCT: 75.7 % (ref 43.0–77.0)
Neutro Abs: 3.1 10*3/uL (ref 1.4–7.7)
Platelets: 141 10*3/uL — ABNORMAL LOW (ref 150.0–400.0)
RBC: 4.77 Mil/uL (ref 4.22–5.81)
RDW: 13.9 % (ref 11.5–15.5)
WBC: 4.1 10*3/uL (ref 4.0–10.5)

## 2017-11-07 LAB — PSA: PSA: 0.59 ng/mL (ref 0.10–4.00)

## 2017-11-07 LAB — HEMOGLOBIN A1C: Hgb A1c MFr Bld: 6 % (ref 4.6–6.5)

## 2017-11-07 LAB — TSH: TSH: 4.84 u[IU]/mL — ABNORMAL HIGH (ref 0.35–4.50)

## 2017-11-07 NOTE — Progress Notes (Signed)
PCP notes:   Health maintenance:  No gaps identified.  Abnormal screenings:   Mini-Cog score: 17/20 MMSE - Mini Mental State Exam 11/07/2017 05/29/2016  Orientation to time 5 5  Orientation to Place 5 5  Registration 3 3  Attention/ Calculation 0 0  Recall 0 3  Recall-comments unable to recall 3 of 3 words -  Language- name 2 objects 0 0  Language- repeat 1 1  Language- follow 3 step command 3 3  Language- read & follow direction 0 0  Write a sentence 0 0  Copy design 0 0  Total score 17 20    Patient concerns:   None  Nurse concerns:  None  Next PCP appt:   11/12/17 @ 1030

## 2017-11-07 NOTE — Patient Instructions (Signed)
Phillip [name] , Thank you for taking time to come for your Medicare Wellness Visit. I appreciate your ongoing commitment to your health goals. Please review the following plan we discussed and let me know if I can assist you in the future.   These are the goals we discussed: Goals    . Increase physical activity     Starting 11/07/2017, I will continue to take medications as prescribed and to keep appointments with PCP as scheduled.        This is a list of the screening recommended for you and due dates:  Health Maintenance  Topic Date Due  . DTaP/Tdap/Td vaccine (1 - Tdap) 09/14/2018*  . Colon Cancer Screening  04/07/2018  . Tetanus Vaccine  09/14/2018  . Flu Shot  Completed  . Pneumonia vaccines  Completed  *Topic was postponed. The date shown is not the original due date.   Preventive Care for Adults  A healthy lifestyle and preventive care can promote health and wellness. Preventive health guidelines for adults include the following key practices.  . A routine yearly physical is a good way to check with your health care provider about your health and preventive screening. It is a chance to share any concerns and updates on your health and to receive a thorough exam.  . Visit your dentist for a routine exam and preventive care every 6 months. Brush your teeth twice a day and floss once a day. Good oral hygiene prevents tooth decay and gum disease.  . The frequency of eye exams is based on your age, health, family medical history, use  of contact lenses, and other factors. Follow your health care provider's recommendations for frequency of eye exams.  . Eat a healthy diet. Foods like vegetables, fruits, whole grains, low-fat dairy products, and lean protein foods contain the nutrients you need without too many calories. Decrease your intake of foods high in solid fats, added sugars, and salt. Eat the right amount of calories for you. Get information about a proper diet from your  health care provider, if necessary.  . Regular physical exercise is one of the most important things you can do for your health. Most adults should get at least 150 minutes of moderate-intensity exercise (any activity that increases your heart rate and causes you to sweat) each week. In addition, most adults need muscle-strengthening exercises on 2 or more days a week.  Silver Sneakers may be a benefit available to you. To determine eligibility, you may visit the website: www.silversneakers.com or contact program at [phone] Mon-Fri between 8AM-8PM.   . Maintain a healthy weight. The body mass index (BMI) is a screening tool to identify possible weight problems. It provides an estimate of body fat based on height and weight. Your health care provider can find your BMI and can help you achieve or maintain a healthy weight.   For adults 20 years and older: ? A BMI below 18.5 is considered underweight. ? A BMI of 18.5 to 24.9 is normal. ? A BMI of 25 to 29.9 is considered overweight. ? A BMI of 30 and above is considered obese.   . Maintain normal blood lipids and cholesterol levels by exercising and minimizing your intake of saturated fat. Eat a balanced diet with plenty of fruit and vegetables. Blood tests for lipids and cholesterol should begin at age 30 and be repeated every 5 years. If your lipid or cholesterol levels are high, you are over 50, or you are at high  risk for heart disease, you may need your cholesterol levels checked more frequently. Ongoing high lipid and cholesterol levels should be treated with medicines if diet and exercise are not working.  . If you smoke, find out from your health care provider how to quit. If you do not use tobacco, please do not start.  . If you choose to drink alcohol, please do not consume more than 2 drinks per day. One drink is considered to be 12 ounces (355 mL) of beer, 5 ounces (148 mL) of wine, or 1.5 ounces (44 mL) of liquor.  . If you are  31-95 years old, ask your health care provider if you should take aspirin to prevent strokes.  . Use sunscreen. Apply sunscreen liberally and repeatedly throughout the day. You should seek shade when your shadow is shorter than you. Protect yourself by wearing long sleeves, pants, a wide-brimmed hat, and sunglasses year round, whenever you are outdoors.  . Once a month, do a whole body skin exam, using a mirror to look at the skin on your back. Tell your health care provider of new moles, moles that have irregular borders, moles that are larger than a pencil eraser, or moles that have changed in shape or color.

## 2017-11-07 NOTE — Progress Notes (Signed)
Subjective:   Phillip [name] is a 77 y.o. male who presents for Medicare Annual/Subsequent preventive examination.  Review of Systems: N/A Cardiac Risk Factors include: advanced age (>54men, >19 women);male gender;hypertension;dyslipidemia     Objective:    Vitals: BP 138/82 (BP Location: Right Arm, Patient Position: Sitting, Cuff Size: Normal)   Pulse 65   Temp 98.6 F (37 C) (Oral)   Ht 6' 0.5" (1.842 m) Comment: no shoes  Wt 208 lb 4 oz (94.5 kg)   SpO2 97%   BMI 27.86 kg/m   Body mass index is 27.86 kg/m.  Advanced Directives 11/07/2017 12/18/2016 05/29/2016 10/21/2015 04/08/2015 03/25/2015 03/10/2015  Does Patient Have a Medical Advance Directive? Yes Yes Yes Yes Yes Yes No  Type of Paramedic of [place];Living will [place];Living will [place];Living will [place];Living will - [place] -  Does patient want to make changes to medical advance directive? - - No - Patient declined No - Patient declined - - -  Copy of [place] in Chart? No - copy requested No - copy requested No - copy requested No - copy requested No - copy requested - -  Would patient like information on creating a medical advance directive? - - - - - - No - patient declined information    Tobacco Social History   Tobacco Use  Smoking Status Never Smoker  Smokeless Tobacco Never Used     Counseling given: No   Clinical Intake:  Pre-visit preparation completed: Yes  Pain : No/denies pain Pain Score: 0-No pain     Nutritional Status: BMI 25 -29 Overweight Nutritional Risks: None Diabetes: No  How often do you need to have someone help you when you read instructions, pamphlets, or other written materials from your doctor or pharmacy?: 1 - Never What is the last grade level you completed in school?: Masters degree  Interpreter Needed?: No  Comments: pt lives with  spouse Information entered by :: LPinson,LPN  Past Medical History:  Diagnosis Date  . Anxiety   . Coronary artery disease 2003   nonbstructive  . Diarrhea   . Hearing loss   . History of benign prostatic hypertrophy   . History of radiation therapy 01/31/10-03/22/10   right tonsil/right neck node 7000 cGy 35 sessions, high risk lymph node volume 5940 cGy 35 sessions, low risk lymph node vol 5600 cGy 35 sessions  . 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 HTN (hypertension)   . Hyperlipidemia    diet controlled- taking medication d/t ensure drinking  . Hypothyroid 01/16/2013  . Neck pain   . Sinus bradycardia   . Squamous cell carcinoma    right tonsil- 2011  . Thrombocytopenia ([place]) 02/02/2012   unclear etiology  . Tinnitus    Past Surgical History:  Procedure Laterality Date  . CARDIAC CATHETERIZATION  2003, 2005   40% blockage, two 30% blockages treated medically [facility])  . COLONOSCOPY  03/2015   TA x3, HP, melanosis coli, severe diverticulosis, rpt 3 yrs ([facility])  . DENTAL SURGERY     extractions  . KNEE ARTHROSCOPY Left   . PEG PLACEMENT  02/2010  . TONSILLECTOMY    . VASECTOMY     Family History  Problem Relation Age of Onset  . Coronary artery disease Father   . Heart attack Father 32  . Heart disease Father   . Colon cancer Neg Hx   .  Esophageal cancer Neg Hx   . Rectal cancer Neg Hx   . Stomach cancer Neg Hx    Social History   Socioeconomic History  . Marital status: Married    Spouse name: None  . Number of children: None  . Years of education: None  . Highest education level: None  Social Needs  . Financial resource strain: None  . Food insecurity - worry: None  . Food insecurity - inability: None  . Transportation needs - medical: None  . Transportation needs - non-medical: None  Occupational History  . None  Tobacco Use  . Smoking status: Never Smoker  . Smokeless tobacco: Never Used  Substance and Sexual Activity  . Alcohol use: Yes    Comment: 6  glasses of wine per year  . Drug use: No  . Sexual activity: Yes  Other Topics Concern  . 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    Plays golf, remains very active with an interest in politics. Jan 2011 moved to area.    Involved in [employer].    He is a runner - has run [employer] and [employer]. S/p torn meniscus.     Outpatient Encounter Medications as of 11/07/2017  Medication Sig  . amLODipine (NORVASC) 10 MG tablet Take 1 tablet (10 mg total) by mouth daily.  Marland Kitchen aspirin 81 MG tablet Take 81 mg by mouth daily.    Marland Kitchen b complex vitamins tablet Take 1 tablet by mouth daily.  . cholecalciferol (VITAMIN D) 1000 units tablet Take 1,000 Units by mouth daily.  Marland Kitchen docusate sodium (STOOL SOFTENER) 250 MG capsule Take 250 mg by mouth daily.  . fenofibrate 160 MG tablet Take 1 tablet (160 mg total) by mouth daily.  . Flaxseed, Linseed, (FLAX SEEDS PO) Take by mouth daily.  . fluticasone (FLONASE) 50 MCG/ACT nasal spray Reported on 02/03/2016  . GLUCERNA (GLUCERNA) LIQD Take 3 Cans by mouth every 4 (four) hours while awake.   . Magnesium 200 MG TABS Take by mouth daily.  Marland Kitchen MELATONIN PO Take 10 mg by mouth at bedtime as needed.  . mirabegron ER (MYRBETRIQ) 25 MG TB24 tablet Take 25 mg by mouth daily.  . Multiple Vitamin (MULTIVITAMIN) tablet Take 1 tablet by mouth daily.  Marland Kitchen POTASSIUM GLUCONATE PO Take by mouth.  . vitamin C (ASCORBIC ACID) 500 MG tablet Take 500 mg by mouth daily.  . vitamin E 400 UNIT capsule Take 400 Units by mouth daily.  . zaleplon (SONATA) 10 MG capsule Take 1 capsule (10 mg total) by mouth as needed for sleep.  . [DISCONTINUED] Melatonin 3 MG CAPS Take 1 capsule by mouth at bedtime as needed.    No facility-administered encounter medications on file as of 11/07/2017.      Activities of Daily Living In your present state of health, do you have any difficulty performing the following activities: 11/07/2017  Hearing? Y  Vision? N  Difficulty concentrating or making decisions? N  Walking or climbing stairs? N  Dressing or bathing? N  Doing errands, shopping? N  Preparing Food and eating ? N  Using the Toilet? N  In the past six months, have you accidently leaked urine? N  Do you have problems with loss of bowel control? N  Managing your Medications? N  Managing your Finances? N  Housekeeping or managing your Housekeeping? N  Some recent data might be hidden    Patient Care Team: [name], MD as PCP - General (Family Medicine) Rozetta Nunnery, MD Arloa Koh, MD (Radiation Oncology) Heath Lark, MD as Consulting Physician (Hematology and Oncology) Marijean Niemann, OD as Consulting Physician (Optometry)   Assessment:   This is a routine wellness examination for Jame.  Hearing Screening Comments: Bilateral hearing aids Vision Screening Comments: Last vision exam in 2018 @ [facility] and Dietary recommendations Current Exercise Habits: Home exercise routine, Type of exercise: Other - see comments;walking(hiking, golf), Time (Minutes): 45, Frequency (Times/Week): 2, Weekly Exercise (Minutes/Week): 90, Intensity: Moderate, Exercise limited by: None identified  Goals    . Increase physical activity     Starting 11/07/2017, I will continue to take medications as prescribed and to keep appointments with PCP as scheduled.        Fall Risk Fall Risk  11/07/2017 12/18/2016 05/29/2016 10/21/2015 10/13/2014  Falls in the past year? No No No No No   Depression Screen PHQ 2/9 Scores 11/07/2017 12/18/2016 05/29/2016 10/21/2015  PHQ - 2 Score 0 0 0 0  PHQ- 9 Score 0 - - -    Cognitive Function MMSE - Mini Mental State Exam 11/07/2017 05/29/2016  Orientation to time 5 5  Orientation to Place 5 5  Registration 3  3  Attention/ Calculation 0 0  Recall 0 3  Recall-comments unable to recall 3 of 3 words -  Language- name 2 objects 0 0  Language- repeat 1 1  Language- follow 3 step command 3 3  Language- read & follow direction 0 0  Write a sentence 0 0  Copy design 0 0  Total score 17 20     PLEASE NOTE: A Mini-Cog screen was completed. Maximum score is 20. A value of 0 denotes this part of [place] MMSE was not completed or the patient failed this part of the Mini-Cog screening.   Mini-Cog Screening Orientation to Time - Max 5 pts Orientation to Place - Max 5 pts Registration - Max 3 pts Recall - Max 3 pts Language Repeat - Max 1 pts Language Follow 3 Step Command - Max 3 pts     Immunization History  Administered Date(s) Administered  . H1N1 07/21/2008  . Influenza Split 06/20/2012  . Influenza Whole 07/02/2006, 05/21/2008, 05/21/2009  . Influenza, High Dose Seasonal PF 06/25/2015  . Influenza,inj,Quad PF,6+ Mos 05/25/2014, 05/29/2016, 05/01/2017  . Influenza-Unspecified 05/21/2013, 06/06/2015  . Pneumococcal Conjugate-13 11/05/2013, 05/25/2014  . Pneumococcal Polysaccharide-23 07/02/2006, 06/25/2015  . Td 09/14/2008    Screening Tests Health Maintenance  Topic Date Due  . DTaP/Tdap/Td (1 - Tdap) 09/14/2018 (Originally 09/15/2008)  . COLONOSCOPY  04/07/2018  . TETANUS/TDAP  09/14/2018  . INFLUENZA VACCINE  Completed  . PNA vac Low Risk Adult  Completed      Plan:     I have personally reviewed, addressed, and noted the following in the patient's chart:  A. Medical and social history B. Use of alcohol, tobacco or illicit drugs  C. Current medications and supplements D. Functional ability and status E.  Nutritional status F.  Physical activity G. Advance directives H. List of other physicians I.  Hospitalizations, surgeries, and ER visits in previous 12 months J.  College to include hearing, vision, cognitive, depression L. Referrals and appointments -  none  In addition, I have reviewed and discussed with patient certain preventive protocols, quality metrics,  and best practice recommendations. A written personalized care plan for preventive services as well as general preventive health recommendations were provided to patient.  See attached scanned questionnaire for additional information.   Signed,   Lindell Noe, MHA, BS, LPN Health Coach

## 2017-11-07 NOTE — Progress Notes (Signed)
   Subjective:    Patient ID: Phillip [name], male    DOB: 1940/11/02, 77 y.o.   MRN: [id number]  HPI  I reviewed health advisor's note, was available for consultation, and agree with documentation and plan.   Review of Systems     Objective:   Physical Exam        Assessment & Plan:

## 2017-11-08 ENCOUNTER — Ambulatory Visit (INDEPENDENT_AMBULATORY_CARE_PROVIDER_SITE_OTHER): Payer: PPO

## 2017-11-08 ENCOUNTER — Other Ambulatory Visit: Payer: Self-pay

## 2017-11-08 DIAGNOSIS — R011 Cardiac murmur, unspecified: Secondary | ICD-10-CM | POA: Diagnosis not present

## 2017-11-08 DIAGNOSIS — I517 Cardiomegaly: Secondary | ICD-10-CM

## 2017-11-08 LAB — COMPREHENSIVE METABOLIC PANEL
ALBUMIN: 4.5 g/dL (ref 3.5–5.2)
ALT: 17 U/L (ref 0–53)
AST: 21 U/L (ref 0–37)
Alkaline Phosphatase: 44 U/L (ref 39–117)
BUN: 22 mg/dL (ref 6–23)
CHLORIDE: 105 meq/L (ref 96–112)
CO2: 27 meq/L (ref 19–32)
Calcium: 10.4 mg/dL (ref 8.4–10.5)
Creatinine, Ser: 1.01 mg/dL (ref 0.40–1.50)
GFR: 75.91 mL/min (ref >60.00)
Glucose, Bld: 132 mg/dL — ABNORMAL HIGH (ref 70–99)
Potassium: 4.5 mEq/L (ref 3.5–5.1)
SODIUM: 141 meq/L (ref 135–145)
Total Bilirubin: 0.4 mg/dL (ref 0.2–1.2)
Total Protein: 6.6 g/dL (ref 6.0–8.3)

## 2017-11-08 LAB — LIPID PANEL
CHOLESTEROL: 138 mg/dL (ref 0–200)
HDL: 44.4 mg/dL (ref >39.00)
LDL CALC: 65 mg/dL (ref 0–99)
NonHDL: 93.38
Total CHOL/HDL Ratio: 3
Triglycerides: 144 mg/dL (ref 0.0–149.0)
VLDL: 28.8 mg/dL (ref 0.0–40.0)

## 2017-11-09 ENCOUNTER — Encounter: Payer: Self-pay | Admitting: Family Medicine

## 2017-11-10 NOTE — Progress Notes (Signed)
Cardiology Office Note    Date:  11/12/2017   ID:  Phillip [name], DOB 1941/06/15, MRN [id number]  PCP:  [name], MD  Cardiologist: [name], MD    Chief Complaint  Patient presents with  . other    Follow up Echo. Meds reviewed by the pt. verbally. "doing well."     History of Present Illness:    Phillip [name] is a 77 y.o. male with past medical history of CAD (nonobstructive CAD by cath in 2003 and 2005), HTN, HLD, and tonsillar squamous cell cancer (s/p XRT) who presents to the office today for follow-up of recent testing.  He was recently evaluated by Dr. Saunders Revel on 10/24/2017 and denied any recent chest pain, dyspnea on exertion, palpitations, orthopnea or lower extremity edema at that time. Did note his stamina had decreased over the past few years which he attributed to being less active. He was noted to have a systolic murmur on examination, therefore an echocardiogram was recommended for further evaluation. This was performed on 11/08/2017 and showed his EF was reduced to 40-45% with hypokinesis of the anterior, anterior septal, and apical myocardium with grade 1 diastolic dysfunction noted. Had trivial MR and trivial TR with no significant valve abnormalities.  It was recommended he have close follow-up to further discuss ischemic evaluation.  In talking with the patient today, he reports overall being very active for his age says that he golfs and goes hiking very regularly. Reports that he actually hiked 4 miles the day before his recent echocardiogram at a good pace and denied any symptoms with this.  He denies any recent chest pain, dyspnea on exertion, orthopnea, PND, or lower extremity edema. He does not check his blood pressure regularly but says that he gets very stressed about politics and the mismanagement of different aspects of our society which likely influences his BP readings. He was an Production designer, theatre/television/film, saying he is always looking for how to "fix  things".    Past Medical History:  Diagnosis Date  . Anxiety   . Cardiomyopathy ([place])    a. 10/2017: echo showing reduced EF of 40-45%, HK of the anterior, anteroseptal and apical myocardium with Grade 1 DD.   Marland Kitchen Coronary artery disease 2003   a. nonobstructive CAD by cath in 2003 and 2005  . Diarrhea   . Hearing loss   . History of benign prostatic hypertrophy   . History of radiation therapy 01/31/10-03/22/10   right tonsil/right neck node 7000 cGy 35 sessions, high risk lymph node volume 5940 cGy 35 sessions, low risk lymph node vol 5600 cGy 35 sessions  . 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 HTN (hypertension)   . Hyperlipidemia    diet controlled- taking medication d/t ensure drinking  . Hypothyroid 01/16/2013  . Neck pain   . Sinus bradycardia   . Squamous cell carcinoma    right tonsil- 2011  . Thrombocytopenia ([place]) 02/02/2012   unclear etiology  . Tinnitus     Past Surgical History:  Procedure Laterality Date  . CARDIAC CATHETERIZATION  2003, 2005   40% blockage, two 30% blockages treated medically [facility])  . COLONOSCOPY  03/2015   TA x3, HP, melanosis coli, severe diverticulosis, rpt 3 yrs ([place])  . DENTAL SURGERY     extractions  . KNEE ARTHROSCOPY Left   . PEG PLACEMENT  02/2010  . TONSILLECTOMY    . VASECTOMY      Current Medications: Outpatient Medications Prior to  Visit  Medication Sig Dispense Refill  . amLODipine (NORVASC) 10 MG tablet Take 1 tablet (10 mg total) by mouth daily. 90 tablet 2  . aspirin 81 MG tablet Take 81 mg by mouth daily.      Marland Kitchen b complex vitamins tablet Take 1 tablet by mouth daily.    . cholecalciferol (VITAMIN D) 1000 units tablet Take 1,000 Units by mouth daily.    Marland Kitchen docusate sodium (STOOL SOFTENER) 250 MG capsule Take 250 mg by mouth daily.    . Flaxseed, Linseed, (FLAX SEEDS PO) Take by mouth daily.    . fluticasone (FLONASE) 50 MCG/ACT nasal spray Reported on 02/03/2016    . GLUCERNA (GLUCERNA) LIQD Take 3 Cans by mouth every 4 (four)  hours while awake. Four times a day    . Magnesium 200 MG TABS Take by mouth daily.    Marland Kitchen MELATONIN PO Take 10 mg by mouth at bedtime as needed.    . mirabegron ER (MYRBETRIQ) 25 MG TB24 tablet Take 25 mg by mouth daily.    . Multiple Vitamin (MULTIVITAMIN) tablet Take 1 tablet by mouth daily.    Marland Kitchen POTASSIUM GLUCONATE PO Take by mouth.    . rosuvastatin (CRESTOR) 20 MG tablet Take 1 tablet (20 mg total) by mouth daily. 90 tablet 3  . vitamin C (ASCORBIC ACID) 500 MG tablet Take 500 mg by mouth daily.    . vitamin E 400 UNIT capsule Take 400 Units by mouth daily.    . zaleplon (SONATA) 10 MG capsule Take 1 capsule (10 mg total) by mouth as needed for sleep. 90 capsule 0   No facility-administered medications prior to visit.      Allergies:   Morphine and related and Ace inhibitors   Social History   Socioeconomic History  . Marital status: Married    Spouse name: Not on file  . Number of children: Not on file  . Years of education: Not on file  . Highest education level: Not on file  Occupational History  . Not on file  Social Needs  . Financial resource strain: Not on file  . Food insecurity:    Worry: Not on file    Inability: Not on file  . Transportation needs:    Medical: Not on file    Non-medical: Not on file  Tobacco Use  . Smoking status: Never Smoker  . Smokeless tobacco: Never Used  Substance and Sexual Activity  . Alcohol use: Yes    Comment: 6 glasses of wine per year  . Drug use: No  . Sexual activity: Yes  Lifestyle  . Physical activity:    Days per week: Not on file    Minutes per session: Not on file  . Stress: Not on file  Relationships  . Social connections:    Talks on phone: Not on file    Gets together: Not on file    Attends religious service: Not on file    Active member of club or organization: Not on file    Attends meetings of clubs or organizations: Not on file    Relationship status: Not on file  Other Topics Concern  . 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    Plays golf, remains very active with an interest in politics. Jan 2011 moved  to area.    Involved in [employer].    He is a runner - has run [employer] and [employer]. S/p torn meniscus.      Family History:  [name] family history includes Coronary artery disease in his father; Heart attack (age of onset: 81) in his father; Heart disease in his father.   Review of Systems:   Please see the history of present illness.     General:  No chills, fever, night sweats or weight changes.  Cardiovascular:  No chest pain, dyspnea on exertion, edema, orthopnea, palpitations, paroxysmal nocturnal dyspnea. Dermatological: No rash, lesions/masses Respiratory: No cough, dyspnea Urologic: No hematuria, dysuria Abdominal:   No nausea, vomiting, diarrhea, bright red blood per rectum, melena, or hematemesis Neurologic:  No visual changes, wkns, changes in mental status.  He denies any of the above symptoms.   All other systems reviewed and are otherwise negative except as noted above.   Physical Exam:    VS:  BP (!) 162/90 (BP Location: Left Arm, Patient Position: Sitting, Cuff Size: Normal)   Pulse 77   Ht 6\' 1"  (1.854 m)   Wt 210 lb 4 oz (95.4 kg)   BMI 27.74 kg/m    General: Well developed, well nourished Caucasian male appearing in no acute distress. Head: Normocephalic, atraumatic, sclera non-icteric, no xanthomas, nares are without discharge.  Neck: No carotid bruits. JVD not elevated.  Lungs: Respirations regular and unlabored, without wheezes or rales.  Heart: Regular rate and rhythm. No S3 or S4.  No murmur, no rubs, or gallops appreciated. Abdomen: Soft, non-tender, non-distended with normoactive bowel sounds. No hepatomegaly. No rebound/guarding. No  obvious abdominal masses. Msk:  Strength and tone appear normal for age. No joint deformities or effusions. Extremities: No clubbing or cyanosis. No lower extremity edema.  Distal pedal pulses are 2+ bilaterally. Neuro: Alert and oriented X 3. Moves all extremities spontaneously. No focal deficits noted. Psych:  Responds to questions appropriately with a normal affect. Skin: No rashes or lesions noted  Wt Readings from Last 3 Encounters:  11/12/17 210 lb 4 oz (95.4 kg)  11/12/17 209 lb 8 oz (95 kg)  11/07/17 208 lb 4 oz (94.5 kg)     Studies/Labs Reviewed:   EKG:  EKG is not ordered today.  Recent Labs: 11/07/2017: ALT 17; BUN 22; Creatinine, Ser 1.01; Hemoglobin 15.4; Platelets 141.0; Potassium 4.5; Sodium 141; TSH 4.84   Lipid Panel    Component Value Date/Time   CHOL 138 11/07/2017 0948   TRIG 144.0 11/07/2017 0948   HDL 44.40 11/07/2017 0948   CHOLHDL 3 11/07/2017 0948   VLDL 28.8 11/07/2017 0948   LDLCALC 65 11/07/2017 0948   LDLDIRECT 101.0 10/13/2014 1154    Additional studies/ records that were reviewed today include:   Echocardiogram: 11/08/2017 Study Conclusions  - Left ventricle: The cavity size was normal. There was mild   concentric hypertrophy. Systolic function was mildly to   moderately reduced. The estimated ejection fraction was in the   range of 40% to 45%. Hypokinesis of the anterior, anteroseptal   and apical myocardium. Doppler parameters are consistent with   abnormal left ventricular relaxation (grade 1 diastolic   dysfunction). - Left atrium: The atrium was mildly dilated. - Right ventricle: Systolic function was normal. - Pulmonary arteries: Systolic pressure was within the normal   range.  Assessment:    1. Cardiomyopathy, unspecified type ([place])   2. Coronary artery disease involving native coronary artery of native  heart without angina pectoris   3. Essential hypertension   4. Mixed hyperlipidemia      Plan:   In order of problems  listed above:  1. New Cardiomyopathy of Unspecified Etiology  - recent echo showed a reduced EF of 40-45% with hypokinesis of the anterior, anterior septal, and apical myocardium with grade 1 diastolic dysfunction noted.  - in talking with the patient, he is very active at baseline and denies any recent anginal symptoms. Goes hiking 1-2 times per week and denies any anginal symptoms with this. We spent a large amount of time reviewing different types of ischemic evaluation including  NST, Cardiac CT and Cardiac Catheterization. Discussed with the patient and in the setting of his asymptomatic state, will plan for noninvasive evaluation initially.Will proceed with a Treadmill Myoview and try to obtain within the next week. Pending results, would add Toprol-XL to his medication regimen (will not start as of yet as this would need to be held for at least 24 hours prior to stress testing). If noted to have any perfusion defects suggestive of ischemia, he is aware the next step would be a cardiac catheterization.  - will again plan for initiation of Toprol-XL 12.5mg  daily in the setting of his cardiomyopathy following NST. Had angioedema with ACE-I therapy in the past and this does not allow for initiation of ACE-I/ARB or ARNI.   2. CAD - The patient has known nonobstructive CAD by cath in 2003 and repeat cath in 2005. - Will plan for stress testing as outlined above. - continue ASA and statin therapy.   3. HTN - BP initially elevated at 162/90 during today's visit, improved to 152/84 on recheck. He says he checked it at home this morning and it was in the 130's/80's.  - I have asked him to continue to follow BP at home. Continue Amlodipine at 10mg  daily. Anticipate initiation of BB therapy pending stress test results.   4. HLD - Recent FLP showed total cholesterol 138, triglycerides 144, HDL 44, and LDL 65.  At goal of LDL less than 70. - continue Rosuvastatin 20mg  daily.    Medication  Adjustments/Labs and Tests Ordered: Current medicines are reviewed at length with the patient today.  Concerns regarding medicines are outlined above.  Medication changes, Labs and Tests ordered today are listed in the Patient Instructions below. Patient Instructions  Medication Instructions: - no changes  Labwork: - none ordered  Procedures/Testing: - Your physician has requested that you have an exercise stress myoview. For further information please visit [email or web address]   [place]  Your caregiver has ordered a Stress Test with nuclear imaging. The purpose of this test is to evaluate the blood supply to your heart muscle. This procedure is referred to as a "Non-Invasive Stress Test." This is because other than having an IV started in your vein, nothing is inserted or "invades" your body. Cardiac stress tests are done to find areas of poor blood flow to the heart by determining the extent of coronary artery disease (CAD). Some patients exercise on a treadmill, which naturally increases the blood flow to your heart, while others who are  unable to walk on a treadmill due to physical limitations have a pharmacologic/chemical stress agent called Lexiscan . This medicine will mimic walking on a treadmill by temporarily increasing your coronary blood flow.   Please note: these test may take anywhere between 2-4 hours to complete  PLEASE REPORT TO [facility]  DESK WILL DIRECT YOU WHERE TO GO  Date of Procedure:_____Monday 4/1/19______________  Arrival Time for Procedure:________8:45 am_____________  Instructions regarding medication:   __x__ : You may take all of your regular medications the morning of your procedure with enough water to get them down safely.  PLEASE NOTIFY THE OFFICE AT LEAST 80 HOURS IN ADVANCE IF YOU ARE UNABLE TO KEEP YOUR APPOINTMENT.  [phone] AND  PLEASE NOTIFY NUCLEAR MEDICINE AT [facility] AT LEAST 24 HOURS IN  ADVANCE IF YOU ARE UNABLE TO KEEP YOUR APPOINTMENT. [phone]  How to prepare for your Myoview test:  1. Do not eat or drink after midnight 2. No caffeine for 24 hours prior to test 3. No smoking 24 hours prior to test. 4. Your medication may be taken with water.  If your doctor stopped a medication because of this test, do not take that medication. 5. Ladies, please do not wear dresses.  Skirts or pants are appropriate. Please wear a short sleeve shirt. 6. No perfume, cologne or lotion. 7. Wear comfortable walking shoes. No heels!  Follow-Up: - Your physician recommends that you schedule a follow-up appointment in: 2 months with Dr. Saunders Revel.   Any Additional Special Instructions Will Be Listed Below (If Applicable).     If you need a refill on your cardiac medications before your next appointment, please call your pharmacy.      Signed, Erma Heritage, PA-C  11/12/2017 8:29 PM    [place] [address] [place], [place] [address] Phone: [phone]

## 2017-11-10 NOTE — H&P (View-Only) (Signed)
Cardiology Office Note    Date:  11/12/2017   ID:  Phillip [name], DOB 12-15-1940, MRN [id number]  PCP:  [name], MD  Cardiologist: [name], MD    Chief Complaint  Patient presents with  . other    Follow up Echo. Meds reviewed by the pt. verbally. "doing well."     History of Present Illness:    Phillip [name] is a 77 y.o. male with past medical history of CAD (nonobstructive CAD by cath in 2003 and 2005), HTN, HLD, and tonsillar squamous cell cancer (s/p XRT) who presents to the office today for follow-up of recent testing.  He was recently evaluated by Dr. Saunders Revel on 10/24/2017 and denied any recent chest pain, dyspnea on exertion, palpitations, orthopnea or lower extremity edema at that time. Did note his stamina had decreased over the past few years which he attributed to being less active. He was noted to have a systolic murmur on examination, therefore an echocardiogram was recommended for further evaluation. This was performed on 11/08/2017 and showed his EF was reduced to 40-45% with hypokinesis of the anterior, anterior septal, and apical myocardium with grade 1 diastolic dysfunction noted. Had trivial MR and trivial TR with no significant valve abnormalities.  It was recommended he have close follow-up to further discuss ischemic evaluation.  In talking with the patient today, he reports overall being very active for his age says that he golfs and goes hiking very regularly. Reports that he actually hiked 4 miles the day before his recent echocardiogram at a good pace and denied any symptoms with this.  He denies any recent chest pain, dyspnea on exertion, orthopnea, PND, or lower extremity edema. He does not check his blood pressure regularly but says that he gets very stressed about politics and the mismanagement of different aspects of our society which likely influences his BP readings. He was an Production designer, theatre/television/film, saying he is always looking for how to "fix  things".    Past Medical History:  Diagnosis Date  . Anxiety   . Cardiomyopathy ([place])    a. 10/2017: echo showing reduced EF of 40-45%, HK of the anterior, anteroseptal and apical myocardium with Grade 1 DD.   Marland Kitchen Coronary artery disease 2003   a. nonobstructive CAD by cath in 2003 and 2005  . Diarrhea   . Hearing loss   . History of benign prostatic hypertrophy   . History of radiation therapy 01/31/10-03/22/10   right tonsil/right neck node 7000 cGy 35 sessions, high risk lymph node volume 5940 cGy 35 sessions, low risk lymph node vol 5600 cGy 35 sessions  . 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 HTN (hypertension)   . Hyperlipidemia    diet controlled- taking medication d/t ensure drinking  . Hypothyroid 01/16/2013  . Neck pain   . Sinus bradycardia   . Squamous cell carcinoma    right tonsil- 2011  . Thrombocytopenia ([place]) 02/02/2012   unclear etiology  . Tinnitus     Past Surgical History:  Procedure Laterality Date  . CARDIAC CATHETERIZATION  2003, 2005   40% blockage, two 30% blockages treated medically Ron Parker)  . COLONOSCOPY  03/2015   TA x3, HP, melanosis coli, severe diverticulosis, rpt 3 yrs ([facility])  . DENTAL SURGERY     extractions  . KNEE ARTHROSCOPY Left   . PEG PLACEMENT  02/2010  . TONSILLECTOMY    . VASECTOMY      Current Medications: Outpatient Medications Prior to  Visit  Medication Sig Dispense Refill  . amLODipine (NORVASC) 10 MG tablet Take 1 tablet (10 mg total) by mouth daily. 90 tablet 2  . aspirin 81 MG tablet Take 81 mg by mouth daily.      Marland Kitchen b complex vitamins tablet Take 1 tablet by mouth daily.    . cholecalciferol (VITAMIN D) 1000 units tablet Take 1,000 Units by mouth daily.    Marland Kitchen docusate sodium (STOOL SOFTENER) 250 MG capsule Take 250 mg by mouth daily.    . Flaxseed, Linseed, (FLAX SEEDS PO) Take by mouth daily.    . fluticasone (FLONASE) 50 MCG/ACT nasal spray Reported on 02/03/2016    . GLUCERNA (GLUCERNA) LIQD Take 3 Cans by mouth every 4 (four)  hours while awake. Four times a day    . Magnesium 200 MG TABS Take by mouth daily.    Marland Kitchen MELATONIN PO Take 10 mg by mouth at bedtime as needed.    . mirabegron ER (MYRBETRIQ) 25 MG TB24 tablet Take 25 mg by mouth daily.    . Multiple Vitamin (MULTIVITAMIN) tablet Take 1 tablet by mouth daily.    Marland Kitchen POTASSIUM GLUCONATE PO Take by mouth.    . rosuvastatin (CRESTOR) 20 MG tablet Take 1 tablet (20 mg total) by mouth daily. 90 tablet 3  . vitamin C (ASCORBIC ACID) 500 MG tablet Take 500 mg by mouth daily.    . vitamin E 400 UNIT capsule Take 400 Units by mouth daily.    . zaleplon (SONATA) 10 MG capsule Take 1 capsule (10 mg total) by mouth as needed for sleep. 90 capsule 0   No facility-administered medications prior to visit.      Allergies:   Morphine and related and Ace inhibitors   Social History   Socioeconomic History  . Marital status: Married    Spouse name: Not on file  . Number of children: Not on file  . Years of education: Not on file  . Highest education level: Not on file  Occupational History  . Not on file  Social Needs  . Financial resource strain: Not on file  . Food insecurity:    Worry: Not on file    Inability: Not on file  . Transportation needs:    Medical: Not on file    Non-medical: Not on file  Tobacco Use  . Smoking status: Never Smoker  . Smokeless tobacco: Never Used  Substance and Sexual Activity  . Alcohol use: Yes    Comment: 6 glasses of wine per year  . Drug use: No  . Sexual activity: Yes  Lifestyle  . Physical activity:    Days per week: Not on file    Minutes per session: Not on file  . Stress: Not on file  Relationships  . Social connections:    Talks on phone: Not on file    Gets together: Not on file    Attends religious service: Not on file    Active member of club or organization: Not on file    Attends meetings of clubs or organizations: Not on file    Relationship status: Not on file  Other Topics Concern  . 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    Plays golf, remains very active with an interest in politics. Jan 2011 moved  to area.    Involved in [employer].    He is a runner - has run [employer] and [employer]. S/p torn meniscus.      Family History:  [name] family history includes Coronary artery disease in his father; Heart attack (age of onset: 82) in his father; Heart disease in his father.   Review of Systems:   Please see the history of present illness.     General:  No chills, fever, night sweats or weight changes.  Cardiovascular:  No chest pain, dyspnea on exertion, edema, orthopnea, palpitations, paroxysmal nocturnal dyspnea. Dermatological: No rash, lesions/masses Respiratory: No cough, dyspnea Urologic: No hematuria, dysuria Abdominal:   No nausea, vomiting, diarrhea, bright red blood per rectum, melena, or hematemesis Neurologic:  No visual changes, wkns, changes in mental status.  He denies any of the above symptoms.   All other systems reviewed and are otherwise negative except as noted above.   Physical Exam:    VS:  BP (!) 162/90 (BP Location: Left Arm, Patient Position: Sitting, Cuff Size: Normal)   Pulse 77   Ht 6\' 1"  (1.854 m)   Wt 210 lb 4 oz (95.4 kg)   BMI 27.74 kg/m    General: Well developed, well nourished Caucasian male appearing in no acute distress. Head: Normocephalic, atraumatic, sclera non-icteric, no xanthomas, nares are without discharge.  Neck: No carotid bruits. JVD not elevated.  Lungs: Respirations regular and unlabored, without wheezes or rales.  Heart: Regular rate and rhythm. No S3 or S4.  No murmur, no rubs, or gallops appreciated. Abdomen: Soft, non-tender, non-distended with normoactive bowel sounds. No hepatomegaly. No rebound/guarding. No  obvious abdominal masses. Msk:  Strength and tone appear normal for age. No joint deformities or effusions. Extremities: No clubbing or cyanosis. No lower extremity edema.  Distal pedal pulses are 2+ bilaterally. Neuro: Alert and oriented X 3. Moves all extremities spontaneously. No focal deficits noted. Psych:  Responds to questions appropriately with a normal affect. Skin: No rashes or lesions noted  Wt Readings from Last 3 Encounters:  11/12/17 210 lb 4 oz (95.4 kg)  11/12/17 209 lb 8 oz (95 kg)  11/07/17 208 lb 4 oz (94.5 kg)     Studies/Labs Reviewed:   EKG:  EKG is not ordered today.  Recent Labs: 11/07/2017: ALT 17; BUN 22; Creatinine, Ser 1.01; Hemoglobin 15.4; Platelets 141.0; Potassium 4.5; Sodium 141; TSH 4.84   Lipid Panel    Component Value Date/Time   CHOL 138 11/07/2017 0948   TRIG 144.0 11/07/2017 0948   HDL 44.40 11/07/2017 0948   CHOLHDL 3 11/07/2017 0948   VLDL 28.8 11/07/2017 0948   LDLCALC 65 11/07/2017 0948   LDLDIRECT 101.0 10/13/2014 1154    Additional studies/ records that were reviewed today include:   Echocardiogram: 11/08/2017 Study Conclusions  - Left ventricle: The cavity size was normal. There was mild   concentric hypertrophy. Systolic function was mildly to   moderately reduced. The estimated ejection fraction was in the   range of 40% to 45%. Hypokinesis of the anterior, anteroseptal   and apical myocardium. Doppler parameters are consistent with   abnormal left ventricular relaxation (grade 1 diastolic   dysfunction). - Left atrium: The atrium was mildly dilated. - Right ventricle: Systolic function was normal. - Pulmonary arteries: Systolic pressure was within the normal   range.  Assessment:    1. Cardiomyopathy, unspecified type ([place])   2. Coronary artery disease involving native coronary artery of native  heart without angina pectoris   3. Essential hypertension   4. Mixed hyperlipidemia      Plan:   In order of problems  listed above:  1. New Cardiomyopathy of Unspecified Etiology  - recent echo showed a reduced EF of 40-45% with hypokinesis of the anterior, anterior septal, and apical myocardium with grade 1 diastolic dysfunction noted.  - in talking with the patient, he is very active at baseline and denies any recent anginal symptoms. Goes hiking 1-2 times per week and denies any anginal symptoms with this. We spent a large amount of time reviewing different types of ischemic evaluation including  NST, Cardiac CT and Cardiac Catheterization. Discussed with the patient and in the setting of his asymptomatic state, will plan for noninvasive evaluation initially.Will proceed with a Treadmill Myoview and try to obtain within the next week. Pending results, would add Toprol-XL to his medication regimen (will not start as of yet as this would need to be held for at least 24 hours prior to stress testing). If noted to have any perfusion defects suggestive of ischemia, he is aware the next step would be a cardiac catheterization.  - will again plan for initiation of Toprol-XL 12.5mg  daily in the setting of his cardiomyopathy following NST. Had angioedema with ACE-I therapy in the past and this does not allow for initiation of ACE-I/ARB or ARNI.   2. CAD - The patient has known nonobstructive CAD by cath in 2003 and repeat cath in 2005. - Will plan for stress testing as outlined above. - continue ASA and statin therapy.   3. HTN - BP initially elevated at 162/90 during today's visit, improved to 152/84 on recheck. He says he checked it at home this morning and it was in the 130's/80's.  - I have asked him to continue to follow BP at home. Continue Amlodipine at 10mg  daily. Anticipate initiation of BB therapy pending stress test results.   4. HLD - Recent FLP showed total cholesterol 138, triglycerides 144, HDL 44, and LDL 65.  At goal of LDL less than 70. - continue Rosuvastatin 20mg  daily.    Medication  Adjustments/Labs and Tests Ordered: Current medicines are reviewed at length with the patient today.  Concerns regarding medicines are outlined above.  Medication changes, Labs and Tests ordered today are listed in the Patient Instructions below. Patient Instructions  Medication Instructions: - no changes  Labwork: - none ordered  Procedures/Testing: - Your physician has requested that you have an exercise stress myoview. For further information please visit [email or web address]   [place]  Your caregiver has ordered a Stress Test with nuclear imaging. The purpose of this test is to evaluate the blood supply to your heart muscle. This procedure is referred to as a "Non-Invasive Stress Test." This is because other than having an IV started in your vein, nothing is inserted or "invades" your body. Cardiac stress tests are done to find areas of poor blood flow to the heart by determining the extent of coronary artery disease (CAD). Some patients exercise on a treadmill, which naturally increases the blood flow to your heart, while others who are  unable to walk on a treadmill due to physical limitations have a pharmacologic/chemical stress agent called Lexiscan . This medicine will mimic walking on a treadmill by temporarily increasing your coronary blood flow.   Please note: these test may take anywhere between 2-4 hours to complete  PLEASE REPORT TO [facility]  DESK WILL DIRECT YOU WHERE TO GO  Date of Procedure:_____Monday 4/1/19______________  Arrival Time for Procedure:________8:45 am_____________  Instructions regarding medication:   __x__ : You may take all of your regular medications the morning of your procedure with enough water to get them down safely.  PLEASE NOTIFY THE OFFICE AT LEAST 30 HOURS IN ADVANCE IF YOU ARE UNABLE TO KEEP YOUR APPOINTMENT.  [phone] AND  PLEASE NOTIFY NUCLEAR MEDICINE AT [facility] AT LEAST 24 HOURS IN  ADVANCE IF YOU ARE UNABLE TO KEEP YOUR APPOINTMENT. [phone]  How to prepare for your Myoview test:  1. Do not eat or drink after midnight 2. No caffeine for 24 hours prior to test 3. No smoking 24 hours prior to test. 4. Your medication may be taken with water.  If your doctor stopped a medication because of this test, do not take that medication. 5. Ladies, please do not wear dresses.  Skirts or pants are appropriate. Please wear a short sleeve shirt. 6. No perfume, cologne or lotion. 7. Wear comfortable walking shoes. No heels!  Follow-Up: - Your physician recommends that you schedule a follow-up appointment in: 2 months with Dr. Saunders Revel.   Any Additional Special Instructions Will Be Listed Below (If Applicable).     If you need a refill on your cardiac medications before your next appointment, please call your pharmacy.      Signed, Erma Heritage, PA-C  11/12/2017 8:29 PM    [place] [address] [place], [place] [address] Phone: [phone]

## 2017-11-11 NOTE — Progress Notes (Signed)
I reviewed health advisor's note, was available for consultation, and agree with documentation and plan.  

## 2017-11-12 ENCOUNTER — Encounter: Payer: Self-pay | Admitting: Student

## 2017-11-12 ENCOUNTER — Ambulatory Visit (INDEPENDENT_AMBULATORY_CARE_PROVIDER_SITE_OTHER): Payer: PPO | Admitting: Family Medicine

## 2017-11-12 ENCOUNTER — Encounter: Payer: Self-pay | Admitting: Family Medicine

## 2017-11-12 ENCOUNTER — Ambulatory Visit: Payer: PPO | Admitting: Student

## 2017-11-12 VITALS — BP 128/64 | HR 56 | Temp 98.5°F | Wt 209.5 lb

## 2017-11-12 VITALS — BP 162/90 | HR 77 | Ht 73.0 in | Wt 210.2 lb

## 2017-11-12 DIAGNOSIS — I10 Essential (primary) hypertension: Secondary | ICD-10-CM

## 2017-11-12 DIAGNOSIS — D696 Thrombocytopenia, unspecified: Secondary | ICD-10-CM

## 2017-11-12 DIAGNOSIS — Z0000 Encounter for general adult medical examination without abnormal findings: Secondary | ICD-10-CM | POA: Diagnosis not present

## 2017-11-12 DIAGNOSIS — R946 Abnormal results of thyroid function studies: Secondary | ICD-10-CM

## 2017-11-12 DIAGNOSIS — I429 Cardiomyopathy, unspecified: Secondary | ICD-10-CM

## 2017-11-12 DIAGNOSIS — I2510 Atherosclerotic heart disease of native coronary artery without angina pectoris: Secondary | ICD-10-CM

## 2017-11-12 DIAGNOSIS — R7303 Prediabetes: Secondary | ICD-10-CM | POA: Diagnosis not present

## 2017-11-12 DIAGNOSIS — E782 Mixed hyperlipidemia: Secondary | ICD-10-CM | POA: Diagnosis not present

## 2017-11-12 DIAGNOSIS — E039 Hypothyroidism, unspecified: Secondary | ICD-10-CM

## 2017-11-12 DIAGNOSIS — R011 Cardiac murmur, unspecified: Secondary | ICD-10-CM | POA: Diagnosis not present

## 2017-11-12 DIAGNOSIS — N401 Enlarged prostate with lower urinary tract symptoms: Secondary | ICD-10-CM

## 2017-11-12 DIAGNOSIS — R351 Nocturia: Secondary | ICD-10-CM

## 2017-11-12 MED ORDER — ROSUVASTATIN CALCIUM 20 MG PO TABS: 20.0000 mg | ORAL_TABLET | Freq: Every day | ORAL | 3 refills | Status: DC

## 2017-11-12 NOTE — Progress Notes (Signed)
BP 128/64   Pulse (!) 56   Temp 98.5 F (36.9 C) (Oral)   Wt 209 lb 8 oz (95 kg)   BMI 28.02 kg/m    CC: CPE Subjective:    Patient ID: Phillip [name], male    DOB: 07-04-1941, 77 y.o.   MRN: [id number]  HPI: Phillip [name] is a 77 y.o. male presenting on 11/12/2017 for Annual Exam   Saw Katha Cabal last week for medicare wellness visit. Note reviewed. Missed 3/3 recall.  Saw urologist - discussed surgery (pt declined) vs altering liquid diet. Treating with myrbetriq samples. F/u left open ended.  Saw cards - echo for further evaluation of murmur - showed EF 40-45% with hypokinesis of several sections of heart, mildly dilated LA. rec statin in place of fibrate. Goal LDL <70.   Preventative: COLONOSCOPY 03/2015 TA x3, HP, melanosis coli, severe diverticulosis, rpt 3 yrs ([facility]) Prostate cancer screening - prior saw urology (Dr Risa Grill). Ongoing LUTS and nocturia.  Lung cancer screening - not eligible Flu shot - yearly Td 2010  Pneumovax 2007, prevnar 2015 x2 Zostavax per patient.  Shingrix - discussed Advanced directive discussion - has at home. Wife is HCPOA. Asked to bring [place] copy.  Seat belt use discussed Sunscreen use discussed. No changing moles on skin. 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  Plays golf, remains very active with an interest in politics. Jan 2011 moved to area.  Involved in [employer].  He is a runner - has run [employer] and [employer]. S/p torn meniscus.   Relevant past medical, surgical, family and social history reviewed and updated as indicated. Interim medical history since our last visit reviewed. Allergies and medications reviewed and updated. Outpatient Medications Prior to Visit  Medication Sig Dispense Refill  . amLODipine (NORVASC) 10 MG tablet Take 1 tablet (10 mg  total) by mouth daily. 90 tablet 2  . aspirin 81 MG tablet Take 81 mg by mouth daily.      Marland Kitchen b complex vitamins tablet Take 1 tablet by mouth daily.    . cholecalciferol (VITAMIN D) 1000 units tablet Take 1,000 Units by mouth daily.    Marland Kitchen docusate sodium (STOOL SOFTENER) 250 MG capsule Take 250 mg by mouth daily.    . Flaxseed, Linseed, (FLAX SEEDS PO) Take by mouth daily.    . fluticasone (FLONASE) 50 MCG/ACT nasal spray Reported on 02/03/2016    . GLUCERNA (GLUCERNA) LIQD Take 3 Cans by mouth every 4 (four) hours while awake. Four times a day    . Magnesium 200 MG TABS Take by mouth daily.    Marland Kitchen MELATONIN PO Take 10 mg by mouth at bedtime as needed.    . mirabegron ER (MYRBETRIQ) 25 MG TB24 tablet Take 25 mg by mouth daily.    . Multiple Vitamin (MULTIVITAMIN) tablet Take 1 tablet by mouth daily.    Marland Kitchen POTASSIUM GLUCONATE PO Take by mouth.    . vitamin C (ASCORBIC ACID) 500 MG tablet Take 500 mg by mouth daily.    . vitamin E 400 UNIT capsule Take 400 Units by mouth daily.    . zaleplon (SONATA) 10 MG capsule Take 1 capsule (10 mg total) by mouth as needed for sleep. 90 capsule 0  . fenofibrate 160 MG tablet Take 1 tablet (160 mg total) by mouth daily. 90 tablet 2  No facility-administered medications prior to visit.      Per HPI unless specifically indicated in ROS section below Review of Systems  Constitutional: Negative for activity change, appetite change, chills, fatigue, fever and unexpected weight change.  HENT: Negative for hearing loss.   Eyes: Negative for visual disturbance.  Respiratory: Negative for cough, chest tightness, shortness of breath and wheezing.   Cardiovascular: Negative for chest pain, palpitations and leg swelling.  Gastrointestinal: Negative for abdominal distention, abdominal pain, blood in stool, constipation, diarrhea, nausea and vomiting.  Genitourinary: Negative for difficulty urinating and hematuria.  Musculoskeletal: Negative for arthralgias, myalgias  and neck pain.  Skin: Negative for rash.  Neurological: Negative for dizziness, seizures, syncope and headaches.       Paresthesias of R arm when watching TV  Hematological: Negative for adenopathy. Does not bruise/bleed easily.  Psychiatric/Behavioral: Negative for dysphoric mood. The patient is not nervous/anxious.        Objective:    BP 128/64   Pulse (!) 56   Temp 98.5 F (36.9 C) (Oral)   Wt 209 lb 8 oz (95 kg)   BMI 28.02 kg/m   Wt Readings from Last 3 Encounters:  11/12/17 209 lb 8 oz (95 kg)  11/07/17 208 lb 4 oz (94.5 kg)  10/24/17 210 lb (95.3 kg)    Physical Exam  Constitutional: He is oriented to person, place, and time. He appears well-developed and well-nourished. No distress.  HENT:  Head: Normocephalic and atraumatic.  Right Ear: Hearing, tympanic membrane, external ear and ear canal normal.  Left Ear: Hearing, tympanic membrane, external ear and ear canal normal.  Nose: Nose normal.  Mouth/Throat: Uvula is midline, oropharynx is clear and moist and mucous membranes are normal. No oropharyngeal exudate, posterior oropharyngeal edema or posterior oropharyngeal erythema.  Eyes: Pupils are equal, round, and reactive to light. Conjunctivae and EOM are normal. No scleral icterus.  Neck: Normal range of motion. Neck supple. No thyromegaly present.  Cardiovascular: Normal rate, regular rhythm, normal heart sounds and intact distal pulses.  No murmur heard. Pulses:      Radial pulses are 2+ on the right side, and 2+ on the left side.  Pulmonary/Chest: Effort normal and breath sounds normal. No respiratory distress. He has no wheezes. He has no rales.  Abdominal: Soft. Bowel sounds are normal. He exhibits no distension and no mass. There is no tenderness. There is no rebound and no guarding.  Musculoskeletal: Normal range of motion. He exhibits no edema.  Lymphadenopathy:    He has no cervical adenopathy.  Neurological: He is alert and oriented to person, place, and  time.  CN grossly intact, station and gait intact  Skin: Skin is warm and dry. No rash noted.  Psychiatric: He has a normal mood and affect. His behavior is normal. Judgment and thought content normal.  Nursing note and vitals reviewed.  Results for orders placed or performed in visit on 11/07/17  CBC with Differential/Platelet  Result Value Ref Range   WBC 4.1 4.0 - 10.5 K/uL   RBC 4.77 4.22 - 5.81 Mil/uL   Hemoglobin 15.4 13.0 - 17.0 g/dL   HCT 45.5 39.0 - 52.0 %   MCV 95.4 78.0 - 100.0 fl   MCHC 33.8 30.0 - 36.0 g/dL   RDW 13.9 11.5 - 15.5 %   Platelets 141.0 (L) 150.0 - 400.0 K/uL   Neutrophils Relative % 75.7 43.0 - 77.0 %   Lymphocytes Relative 12.5 12.0 - 46.0 %   Monocytes Relative  10.4 3.0 - 12.0 %   Eosinophils Relative 1.2 0.0 - 5.0 %   Basophils Relative 0.2 0.0 - 3.0 %   Neutro Abs 3.1 1.4 - 7.7 K/uL   Lymphs Abs 0.5 (L) 0.7 - 4.0 K/uL   Monocytes Absolute 0.4 0.1 - 1.0 K/uL   Eosinophils Absolute 0.1 0.0 - 0.7 K/uL   Basophils Absolute 0.0 0.0 - 0.1 K/uL  PSA  Result Value Ref Range   PSA 0.59 0.10 - 4.00 ng/mL  Hemoglobin A1c  Result Value Ref Range   Hgb A1c MFr Bld 6.0 4.6 - 6.5 %  TSH  Result Value Ref Range   TSH 4.84 (H) 0.35 - 4.50 uIU/mL  Comprehensive metabolic panel  Result Value Ref Range   Sodium 141 135 - 145 mEq/L   Potassium 4.5 3.5 - 5.1 mEq/L   Chloride 105 96 - 112 mEq/L   CO2 27 19 - 32 mEq/L   Glucose, Bld 132 (H) 70 - 99 mg/dL   BUN 22 6 - 23 mg/dL   Creatinine, Ser 1.01 0.40 - 1.50 mg/dL   Total Bilirubin 0.4 0.2 - 1.2 mg/dL   Alkaline Phosphatase 44 39 - 117 U/L   AST 21 0 - 37 U/L   ALT 17 0 - 53 U/L   Total Protein 6.6 6.0 - 8.3 g/dL   Albumin 4.5 3.5 - 5.2 g/dL   Calcium 10.4 8.4 - 10.5 mg/dL   GFR 75.91 >60.00 mL/min  Lipid panel  Result Value Ref Range   Cholesterol 138 0 - 200 mg/dL   Triglycerides 144.0 0.0 - 149.0 mg/dL   HDL 44.40 >39.00 mg/dL   VLDL 28.8 0.0 - 40.0 mg/dL   LDL Cholesterol 65 0 - 99 mg/dL    Total CHOL/HDL Ratio 3    NonHDL 93.38       Assessment & Plan:   Problem List Items Addressed This Visit    Borderline hypothyroidism    TSH slightly elevated - will continue to watch. Mild constipation, no other hypothyroid sxs.       BPH associated with nocturia    Sees urology. Known BPH. Now on myrbetriq. Nocturia largely driven by predominantly liquid diet.       Coronary artery disease involving native coronary artery of native heart without angina pectoris    Appreciate cards care. Will start statin, continue aspirin. Upcoming cards f/u      Relevant Medications   rosuvastatin (CRESTOR) 20 MG tablet   Essential hypertension    Chronic, stable. Continue current regimen.       Relevant Medications   rosuvastatin (CRESTOR) 20 MG tablet   Health care maintenance - Primary    Preventative protocols reviewed and updated unless pt declined. Discussed healthy diet and lifestyle.       Mixed hyperlipidemia    Chronic, stable on fenofibrate. Discussion on statin vs fibrate - will Rx crestor 20mg  in place of fibrate. Pt agrees with plan. Will repeat labs in 6 months at f/u visit.       Relevant Medications   rosuvastatin (CRESTOR) 20 MG tablet   Prediabetes    Chronic, stable. Will continue to follow.       Systolic murmur    Not appreciated today.       Thrombocytopenia ([facility])    Chronic, stable. Continue to monitor.           Meds ordered this encounter  Medications  . rosuvastatin (CRESTOR) 20 MG tablet    Sig:  Take 1 tablet (20 mg total) by mouth daily.    Dispense:  90 tablet    Refill:  3    In place of fenofibrate   No orders of the defined types were placed in this encounter.   Follow up plan: Return in about 6 months (around 05/15/2018) for follow up visit.  [name], MD

## 2017-11-12 NOTE — Patient Instructions (Addendum)
Medication Instructions: - no changes  Labwork: - none ordered  Procedures/Testing: - Your physician has requested that you have an exercise stress myoview. For further information please visit [email or web address]   [place]  Your caregiver has ordered a Stress Test with nuclear imaging. The purpose of this test is to evaluate the blood supply to your heart muscle. This procedure is referred to as a "Non-Invasive Stress Test." This is because other than having an IV started in your vein, nothing is inserted or "invades" your body. Cardiac stress tests are done to find areas of poor blood flow to the heart by determining the extent of coronary artery disease (CAD). Some patients exercise on a treadmill, which naturally increases the blood flow to your heart, while others who are  unable to walk on a treadmill due to physical limitations have a pharmacologic/chemical stress agent called Lexiscan . This medicine will mimic walking on a treadmill by temporarily increasing your coronary blood flow.   Please note: these test may take anywhere between 2-4 hours to complete  PLEASE REPORT TO [facility] AT THE FIRST DESK WILL DIRECT YOU WHERE TO GO  Date of Procedure:_____Monday 4/1/19______________  Arrival Time for Procedure:________8:45 am_____________  Instructions regarding medication:   __x__ : You may take all of your regular medications the morning of your procedure with enough water to get them down safely.  PLEASE NOTIFY THE OFFICE AT LEAST 43 HOURS IN ADVANCE IF YOU ARE UNABLE TO KEEP YOUR APPOINTMENT.  [phone] AND  PLEASE NOTIFY NUCLEAR MEDICINE AT [facility] AT LEAST 24 HOURS IN ADVANCE IF YOU ARE UNABLE TO KEEP YOUR APPOINTMENT. [phone]  How to prepare for your Myoview test:  1. Do not eat or drink after midnight 2. No caffeine for 24 hours prior to test 3. No smoking 24 hours prior to test. 4. Your medication may be taken with water.   If your doctor stopped a medication because of this test, do not take that medication. 5. Ladies, please do not wear dresses.  Skirts or pants are appropriate. Please wear a short sleeve shirt. 6. No perfume, cologne or lotion. 7. Wear comfortable walking shoes. No heels!  Follow-Up: - Your physician recommends that you schedule a follow-up appointment in: 2 months with Dr. Saunders Revel.   Any Additional Special Instructions Will Be Listed Below (If Applicable).     If you need a refill on your cardiac medications before your next appointment, please call your pharmacy.

## 2017-11-12 NOTE — Assessment & Plan Note (Signed)
Appreciate cards care. Will start statin, continue aspirin. Upcoming cards f/u

## 2017-11-12 NOTE — Assessment & Plan Note (Signed)
Not appreciated today.  

## 2017-11-12 NOTE — Assessment & Plan Note (Signed)
Chronic, stable. Continue to monitor.  

## 2017-11-12 NOTE — Assessment & Plan Note (Signed)
Preventative protocols reviewed and updated unless pt declined. Discussed healthy diet and lifestyle.  

## 2017-11-12 NOTE — Assessment & Plan Note (Signed)
Chronic, stable. Continue current regimen. 

## 2017-11-12 NOTE — Assessment & Plan Note (Signed)
Chronic, stable on fenofibrate. Discussion on statin vs fibrate - will Rx crestor 20mg  in place of fibrate. Pt agrees with plan. Will repeat labs in 6 months at f/u visit.

## 2017-11-12 NOTE — Patient Instructions (Addendum)
If interested, check with pharmacy about new 2 shot shingles series (shingrix). Stop fenofibrate. Start crestor 20mg  daily - better for cardiovascular risk reduction.  Keep follow up with cardiology  Return as needed or in 6 months for follow up visit with labs.  Health Maintenance, Male A healthy lifestyle and preventive care is important for your health and wellness. Ask your health care provider about what schedule of regular examinations is right for you. What should I know about weight and diet? Eat a Healthy Diet  Eat plenty of vegetables, fruits, whole grains, low-fat dairy products, and lean protein.  Do not eat a lot of foods high in solid fats, added sugars, or salt.  Maintain a Healthy Weight Regular exercise can help you achieve or maintain a healthy weight. You should:  Do at least 150 minutes of exercise each week. The exercise should increase your heart rate and make you sweat (moderate-intensity exercise).  Do strength-training exercises at least twice a week.  Watch Your Levels of Cholesterol and Blood Lipids  Have your blood tested for lipids and cholesterol every 5 years starting at 78 years of age. If you are at high risk for heart disease, you should start having your blood tested when you are 78 years old. You may need to have your cholesterol levels checked more often if: ? Your lipid or cholesterol levels are high. ? You are older than 78 years of age. ? You are at high risk for heart disease.  What should I know about cancer screening? Many types of cancers can be detected early and may often be prevented. Lung Cancer  You should be screened every year for lung cancer if: ? You are a current smoker who has smoked for at least 30 years. ? You are a former smoker who has quit within the past 15 years.  Talk to your health care provider about your screening options, when you should start screening, and how often you should be screened.  Colorectal  Cancer  Routine colorectal cancer screening usually begins at 78 years of age and should be repeated every 5-10 years until you are 78 years old. You may need to be screened more often if early forms of precancerous polyps or small growths are found. Your health care provider may recommend screening at an earlier age if you have risk factors for colon cancer.  Your health care provider may recommend using home test kits to check for hidden blood in the stool.  A small camera at the end of a tube can be used to examine your colon (sigmoidoscopy or colonoscopy). This checks for the earliest forms of colorectal cancer.  Prostate and Testicular Cancer  Depending on your age and overall health, your health care provider may do certain tests to screen for prostate and testicular cancer.  Talk to your health care provider about any symptoms or concerns you have about testicular or prostate cancer.  Skin Cancer  Check your skin from head to toe regularly.  Tell your health care provider about any new moles or changes in moles, especially if: ? There is a change in a mole's size, shape, or color. ? You have a mole that is larger than a pencil eraser.  Always use sunscreen. Apply sunscreen liberally and repeat throughout the day.  Protect yourself by wearing long sleeves, pants, a wide-brimmed hat, and sunglasses when outside.  What should I know about heart disease, diabetes, and high blood pressure?  If you are 18-39 years  of age, have your blood pressure checked every 3-5 years. If you are 51 years of age or older, have your blood pressure checked every year. You should have your blood pressure measured twice-once when you are at a hospital or clinic, and once when you are not at a hospital or clinic. Record the average of the two measurements. To check your blood pressure when you are not at a hospital or clinic, you can use: ? An automated blood pressure machine at a pharmacy. ? A home blood  pressure monitor.  Talk to your health care provider about your target blood pressure.  If you are between 81-66 years old, ask your health care provider if you should take aspirin to prevent heart disease.  Have regular diabetes screenings by checking your fasting blood sugar level. ? If you are at a normal weight and have a low risk for diabetes, have this test once every three years after the age of 44. ? If you are overweight and have a high risk for diabetes, consider being tested at a younger age or more often.  A one-time screening for abdominal aortic aneurysm (AAA) by ultrasound is recommended for men aged 32-75 years who are current or former smokers. What should I know about preventing infection? Hepatitis B If you have a higher risk for hepatitis B, you should be screened for this virus. Talk with your health care provider to find out if you are at risk for hepatitis B infection. Hepatitis C Blood testing is recommended for:  Everyone born from [birth year] through 1966.  Anyone with known risk factors for hepatitis C.  Sexually Transmitted Diseases (STDs)  You should be screened each year for STDs including gonorrhea and chlamydia if: ? You are sexually active and are younger than 78 years of age. ? You are older than 78 years of age and your health care provider tells you that you are at risk for this type of infection. ? Your sexual activity has changed since you were last screened and you are at an increased risk for chlamydia or gonorrhea. Ask your health care provider if you are at risk.  Talk with your health care provider about whether you are at high risk of being infected with HIV. Your health care provider may recommend a prescription medicine to help prevent HIV infection.  What else can I do?  Schedule regular health, dental, and eye exams.  Stay current with your vaccines (immunizations).  Do not use any tobacco products, such as cigarettes, chewing tobacco, and  e-cigarettes. If you need help quitting, ask your health care provider.  Limit alcohol intake to no more than 2 drinks per day. One drink equals 12 ounces of beer, 5 ounces of wine, or 1 ounces of hard liquor.  Do not use street drugs.  Do not share needles.  Ask your health care provider for help if you need support or information about quitting drugs.  Tell your health care provider if you often feel depressed.  Tell your health care provider if you have ever been abused or do not feel safe at home. This information is not intended to replace advice given to you by your health care provider. Make sure you discuss any questions you have with your health care provider. Document Released: 02/03/2008 Document Revised: 04/05/2016 Document Reviewed: 05/11/2015 Elsevier Interactive Patient Education  [employer].

## 2017-11-12 NOTE — Assessment & Plan Note (Signed)
TSH slightly elevated - will continue to watch. Mild constipation, no other hypothyroid sxs.

## 2017-11-12 NOTE — Assessment & Plan Note (Signed)
Chronic, stable. Will continue to follow.

## 2017-11-12 NOTE — Assessment & Plan Note (Signed)
Sees urology. Known BPH. Now on myrbetriq. Nocturia largely driven by predominantly liquid diet.

## 2017-11-19 ENCOUNTER — Ambulatory Visit
Admission: RE | Admit: 2017-11-19 | Discharge: 2017-11-19 | Disposition: A | Discharge status: Home / Self Care | Payer: PPO | Source: Ambulatory Visit | Attending: Student | Admitting: Student

## 2017-11-19 DIAGNOSIS — I429 Cardiomyopathy, unspecified: Secondary | ICD-10-CM | POA: Diagnosis not present

## 2017-11-19 HISTORY — PX: OTHER SURGICAL HISTORY: SHX169

## 2017-11-19 LAB — NM MYOCAR MULTI W/SPECT W/WALL MOTION / EF
CHL CUP NUCLEAR SSS: 21
CSEPEDS: 30 s
CSEPEW: 9.3 METS
Exercise duration (min): 7 min
LV dias vol: 199 mL (ref 62–150)
LVSYSVOL: 116 mL
NUC STRESS TID: 1.3
Peak HR: 141 {beats}/min
Percent HR: 99 %
Rest HR: 57 {beats}/min
SDS: 8
SRS: 23

## 2017-11-19 MED ORDER — TECHNETIUM TC 99M TETROFOSMIN IV KIT
32.2750 | PACK | Freq: Once | INTRAVENOUS | Status: AC | PRN
  Administered 2017-11-19: 32.275 via INTRAVENOUS

## 2017-11-19 MED ORDER — TECHNETIUM TC 99M TETROFOSMIN IV KIT
14.1100 | PACK | Freq: Once | INTRAVENOUS | Status: AC | PRN
  Administered 2017-11-19: 14.11 via INTRAVENOUS

## 2017-11-20 ENCOUNTER — Telehealth: Payer: Self-pay | Admitting: Internal Medicine

## 2017-11-20 DIAGNOSIS — I2510 Atherosclerotic heart disease of native coronary artery without angina pectoris: Secondary | ICD-10-CM

## 2017-11-20 DIAGNOSIS — R9439 Abnormal result of other cardiovascular function study: Secondary | ICD-10-CM

## 2017-11-20 DIAGNOSIS — Z01810 Encounter for preprocedural cardiovascular examination: Secondary | ICD-10-CM

## 2017-11-20 MED ORDER — AMLODIPINE BESYLATE 10 MG PO TABS: 5.0000 mg | ORAL_TABLET | Freq: Every day | ORAL | Status: DC

## 2017-11-20 MED ORDER — CARVEDILOL 3.125 MG PO TABS: 3.1250 mg | ORAL_TABLET | Freq: Two times a day (BID) | ORAL | 5 refills | Status: DC

## 2017-11-20 NOTE — Telephone Encounter (Signed)
I spoke with Mr. Hagin regarding the results of his stress test, which was high risk with reduced LVEF and evidence of ischemia in the LAD territory.  This mirrors the results of his echocardiogram.  I have recommended cardiac catheterization for further evaluation, which Mr. Hands is in agreement with. I have reviewed the risks, indications, and alternatives to cardiac catheterization, possible angioplasty, and stenting with the patient. Risks include but are not limited to bleeding, infection, vascular injury, stroke, myocardial infection, arrhythmia, kidney injury, radiation-related injury in the case of prolonged fluoroscopy use, emergency cardiac surgery, and death. The patient understands the risks of serious complication is 1-2 in 3662 with diagnostic cardiac cath and 1-2% or less with angioplasty/stenting.  Mr. Bignell also notes increased swelling in his ankles since increasing amlodipine.  We will decrease this to 5 mg daily and add carvedilol 3.125 mg twice daily for blood pressure control and treatment of his reduced LVEF.  [name], MD [facility] Pager: [phone]

## 2017-11-21 NOTE — Telephone Encounter (Signed)
No answer. Left message to call back on both numbers listed.  °

## 2017-11-22 ENCOUNTER — Encounter: Payer: Self-pay | Admitting: *Deleted

## 2017-11-22 ENCOUNTER — Other Ambulatory Visit: Payer: Self-pay | Admitting: Internal Medicine

## 2017-11-22 DIAGNOSIS — R9439 Abnormal result of other cardiovascular function study: Secondary | ICD-10-CM

## 2017-11-22 DIAGNOSIS — I429 Cardiomyopathy, unspecified: Secondary | ICD-10-CM

## 2017-11-22 NOTE — Telephone Encounter (Signed)
Pt returning our call °Please call back ° °

## 2017-11-22 NOTE — Addendum Note (Signed)
Addended by: Vanessa Ralphs on: 11/22/2017 10:56 AM   Modules accepted: Orders

## 2017-11-22 NOTE — Telephone Encounter (Signed)
Called patient. He is agreeable to left heart cath on 12/10/17.  [facility] verbalized [facility] prior to procedure for CXR and lab work. Orders entered. Patient requested I put pre-procedural instructions and date/ time of procedure on MyChart for him. He will call if he has any further questions.  S/w Crystal at [facility] lab. Patient scheduled for 12/10/17 at 10:30 am with arrival time of 0800. MyChart Message sent to patient.

## 2017-12-03 ENCOUNTER — Other Ambulatory Visit
Admission: RE | Admit: 2017-12-03 | Discharge: 2017-12-03 | Disposition: A | Discharge status: Home / Self Care | Payer: PPO | Source: Ambulatory Visit | Attending: Internal Medicine | Admitting: Internal Medicine

## 2017-12-03 DIAGNOSIS — Z01810 Encounter for preprocedural cardiovascular examination: Secondary | ICD-10-CM | POA: Insufficient documentation

## 2017-12-03 DIAGNOSIS — R9439 Abnormal result of other cardiovascular function study: Secondary | ICD-10-CM | POA: Diagnosis not present

## 2017-12-03 DIAGNOSIS — I2510 Atherosclerotic heart disease of native coronary artery without angina pectoris: Secondary | ICD-10-CM | POA: Diagnosis not present

## 2017-12-03 LAB — BASIC METABOLIC PANEL
ANION GAP: 4 — AB (ref 5–15)
BUN: 21 mg/dL — ABNORMAL HIGH (ref 6–20)
CO2: 29 mmol/L (ref 22–32)
Calcium: 9.5 mg/dL (ref 8.9–10.3)
Chloride: 107 mmol/L (ref 101–111)
Creatinine, Ser: 0.78 mg/dL (ref 0.61–1.24)
GFR calc Af Amer: >60 mL/min (ref >60)
Glucose, Bld: 112 mg/dL — ABNORMAL HIGH (ref 65–99)
POTASSIUM: 5.1 mmol/L (ref 3.5–5.1)
SODIUM: 140 mmol/L (ref 135–145)

## 2017-12-03 LAB — CBC WITH DIFFERENTIAL/PLATELET
BASOS ABS: 0 10*3/uL (ref 0–0.1)
Basophils Relative: 0 %
EOS ABS: 0.1 10*3/uL (ref 0–0.7)
EOS PCT: 2 %
HCT: 44.8 % (ref 40.0–52.0)
Hemoglobin: 15.5 g/dL (ref 13.0–18.0)
Lymphocytes Relative: 12 %
Lymphs Abs: 0.6 10*3/uL — ABNORMAL LOW (ref 1.0–3.6)
MCH: 32.7 pg (ref 26.0–34.0)
MCHC: 34.7 g/dL (ref 32.0–36.0)
MCV: 94.4 fL (ref 80.0–100.0)
Monocytes Absolute: 0.6 10*3/uL (ref 0.2–1.0)
Monocytes Relative: 12 %
Neutro Abs: 3.7 10*3/uL (ref 1.4–6.5)
Neutrophils Relative %: 74 %
PLATELETS: 147 10*3/uL — AB (ref 150–440)
RBC: 4.74 MIL/uL (ref 4.40–5.90)
RDW: 14 % (ref 11.5–14.5)
WBC: 5 10*3/uL (ref 3.8–10.6)

## 2017-12-06 ENCOUNTER — Telehealth: Payer: Self-pay | Admitting: *Deleted

## 2017-12-06 NOTE — Telephone Encounter (Signed)
Pt contacted pre-catheterization scheduled at [facility] for: Monday April 22,2019 10:30 AM Verified arrival time and place: [facility] at: 8 AM Nothing to eat or drink after midnight prior to cath. Verified allergies in Epic. Verified no diabetes medications   AM meds can be  taken pre-cath with sip of water including: ASA 81 mg  Confirmed patient has responsible person to drive home post procedure and observe patient for 24 hours: yes

## 2017-12-10 ENCOUNTER — Other Ambulatory Visit: Payer: Self-pay | Admitting: *Deleted

## 2017-12-10 ENCOUNTER — Encounter (HOSPITAL_COMMUNITY)
Admission: RE | Disposition: A | Discharge status: Home / Self Care | Payer: Self-pay | Source: Ambulatory Visit | Attending: Cardiothoracic Surgery

## 2017-12-10 ENCOUNTER — Inpatient Hospital Stay (HOSPITAL_COMMUNITY): Payer: PPO

## 2017-12-10 ENCOUNTER — Other Ambulatory Visit: Payer: Self-pay

## 2017-12-10 ENCOUNTER — Encounter (HOSPITAL_COMMUNITY): Payer: Self-pay

## 2017-12-10 ENCOUNTER — Inpatient Hospital Stay (HOSPITAL_COMMUNITY)
Admission: RE | Admit: 2017-12-10 | Discharge: 2017-12-18 | DRG: 234 | Disposition: A | Discharge status: Home / Self Care | Payer: PPO | Source: Ambulatory Visit | Attending: Cardiothoracic Surgery | Admitting: Cardiothoracic Surgery

## 2017-12-10 DIAGNOSIS — H9190 Unspecified hearing loss, unspecified ear: Secondary | ICD-10-CM | POA: Diagnosis present

## 2017-12-10 DIAGNOSIS — D62 Acute posthemorrhagic anemia: Secondary | ICD-10-CM | POA: Diagnosis not present

## 2017-12-10 DIAGNOSIS — I200 Unstable angina: Secondary | ICD-10-CM | POA: Diagnosis present

## 2017-12-10 DIAGNOSIS — E875 Hyperkalemia: Secondary | ICD-10-CM | POA: Diagnosis not present

## 2017-12-10 DIAGNOSIS — I2582 Chronic total occlusion of coronary artery: Secondary | ICD-10-CM | POA: Diagnosis present

## 2017-12-10 DIAGNOSIS — R0789 Other chest pain: Secondary | ICD-10-CM

## 2017-12-10 DIAGNOSIS — I2510 Atherosclerotic heart disease of native coronary artery without angina pectoris: Secondary | ICD-10-CM

## 2017-12-10 DIAGNOSIS — I480 Paroxysmal atrial fibrillation: Secondary | ICD-10-CM | POA: Diagnosis not present

## 2017-12-10 DIAGNOSIS — I25110 Atherosclerotic heart disease of native coronary artery with unstable angina pectoris: Secondary | ICD-10-CM | POA: Diagnosis not present

## 2017-12-10 DIAGNOSIS — E8770 Fluid overload, unspecified: Secondary | ICD-10-CM | POA: Diagnosis not present

## 2017-12-10 DIAGNOSIS — Z7982 Long term (current) use of aspirin: Secondary | ICD-10-CM | POA: Diagnosis not present

## 2017-12-10 DIAGNOSIS — J90 Pleural effusion, not elsewhere classified: Secondary | ICD-10-CM | POA: Diagnosis not present

## 2017-12-10 DIAGNOSIS — Z09 Encounter for follow-up examination after completed treatment for conditions other than malignant neoplasm: Secondary | ICD-10-CM

## 2017-12-10 DIAGNOSIS — J9811 Atelectasis: Secondary | ICD-10-CM | POA: Diagnosis not present

## 2017-12-10 DIAGNOSIS — Z7951 Long term (current) use of inhaled steroids: Secondary | ICD-10-CM

## 2017-12-10 DIAGNOSIS — Z01810 Encounter for preprocedural cardiovascular examination: Secondary | ICD-10-CM | POA: Diagnosis not present

## 2017-12-10 DIAGNOSIS — Z4682 Encounter for fitting and adjustment of non-vascular catheter: Secondary | ICD-10-CM | POA: Diagnosis not present

## 2017-12-10 DIAGNOSIS — R9439 Abnormal result of other cardiovascular function study: Secondary | ICD-10-CM | POA: Diagnosis not present

## 2017-12-10 DIAGNOSIS — I255 Ischemic cardiomyopathy: Secondary | ICD-10-CM | POA: Diagnosis present

## 2017-12-10 DIAGNOSIS — Z951 Presence of aortocoronary bypass graft: Secondary | ICD-10-CM

## 2017-12-10 DIAGNOSIS — I371 Nonrheumatic pulmonary valve insufficiency: Secondary | ICD-10-CM | POA: Diagnosis not present

## 2017-12-10 DIAGNOSIS — N400 Enlarged prostate without lower urinary tract symptoms: Secondary | ICD-10-CM | POA: Diagnosis present

## 2017-12-10 DIAGNOSIS — Z923 Personal history of irradiation: Secondary | ICD-10-CM | POA: Diagnosis not present

## 2017-12-10 DIAGNOSIS — Z79899 Other long term (current) drug therapy: Secondary | ICD-10-CM | POA: Diagnosis not present

## 2017-12-10 DIAGNOSIS — I10 Essential (primary) hypertension: Secondary | ICD-10-CM | POA: Diagnosis not present

## 2017-12-10 DIAGNOSIS — Z8679 Personal history of other diseases of the circulatory system: Secondary | ICD-10-CM | POA: Diagnosis not present

## 2017-12-10 DIAGNOSIS — E119 Type 2 diabetes mellitus without complications: Secondary | ICD-10-CM | POA: Diagnosis not present

## 2017-12-10 DIAGNOSIS — Z01818 Encounter for other preprocedural examination: Secondary | ICD-10-CM

## 2017-12-10 DIAGNOSIS — E782 Mixed hyperlipidemia: Secondary | ICD-10-CM | POA: Diagnosis present

## 2017-12-10 DIAGNOSIS — Z885 Allergy status to narcotic agent status: Secondary | ICD-10-CM

## 2017-12-10 DIAGNOSIS — Z9109 Other allergy status, other than to drugs and biological substances: Secondary | ICD-10-CM | POA: Diagnosis not present

## 2017-12-10 DIAGNOSIS — I429 Cardiomyopathy, unspecified: Secondary | ICD-10-CM

## 2017-12-10 DIAGNOSIS — Z9689 Presence of other specified functional implants: Secondary | ICD-10-CM

## 2017-12-10 DIAGNOSIS — I081 Rheumatic disorders of both mitral and tricuspid valves: Secondary | ICD-10-CM | POA: Diagnosis not present

## 2017-12-10 HISTORY — PX: LEFT HEART CATH AND CORONARY ANGIOGRAPHY: CATH118249

## 2017-12-10 LAB — PULMONARY FUNCTION TEST
FEF 25-75 PRE: 1.69 L/s
FEF 25-75 Post: 1.95 L/sec
FEF2575-%CHANGE-POST: 15 %
FEF2575-%PRED-POST: 79 %
FEF2575-%Pred-Pre: 69 %
FEV1-%Change-Post: 3 %
FEV1-%PRED-POST: 80 %
FEV1-%Pred-Pre: 77 %
FEV1-Post: 2.79 L
FEV1-Pre: 2.69 L
FEV1FVC-%CHANGE-POST: 16 %
FEV1FVC-%Pred-Pre: 99 %
FEV6-%Change-Post: -8 %
FEV6-%Pred-Post: 74 %
FEV6-%Pred-Pre: 81 %
FEV6-PRE: 3.68 L
FEV6-Post: 3.35 L
FEV6FVC-%Change-Post: 2 %
FEV6FVC-%PRED-PRE: 103 %
FEV6FVC-%Pred-Post: 106 %
FVC-%CHANGE-POST: -11 %
FVC-%PRED-PRE: 78 %
FVC-%Pred-Post: 69 %
FVC-PRE: 3.77 L
FVC-Post: 3.35 L
POST FEV1/FVC RATIO: 83 %
PRE FEV6/FVC RATIO: 97 %
Post FEV6/FVC ratio: 100 %
Pre FEV1/FVC ratio: 71 %

## 2017-12-10 LAB — URINALYSIS, ROUTINE W REFLEX MICROSCOPIC
Bilirubin Urine: NEGATIVE
Glucose, UA: NEGATIVE mg/dL
Hgb urine dipstick: NEGATIVE
Ketones, ur: NEGATIVE mg/dL
Leukocytes, UA: NEGATIVE
Nitrite: NEGATIVE
Protein, ur: NEGATIVE mg/dL
Specific Gravity, Urine: 1.011 (ref 1.005–1.030)
pH: 8 (ref 5.0–8.0)

## 2017-12-10 SURGERY — LEFT HEART CATH AND CORONARY ANGIOGRAPHY
Anesthesia: LOCAL

## 2017-12-10 MED ORDER — ROSUVASTATIN CALCIUM 20 MG PO TABS: 20.0000 mg | ORAL_TABLET | Freq: Every day | ORAL | Status: DC

## 2017-12-10 MED ORDER — ALBUTEROL SULFATE (2.5 MG/3ML) 0.083% IN NEBU
2.5000 mg | INHALATION_SOLUTION | Freq: Once | RESPIRATORY_TRACT | Status: AC
  Administered 2017-12-10: 2.5 mg via RESPIRATORY_TRACT

## 2017-12-10 MED ORDER — SODIUM CHLORIDE 0.9% FLUSH
3.0000 mL | INTRAVENOUS | Status: DC | PRN
Start: 2017-12-10 — End: 2017-12-12

## 2017-12-10 MED ORDER — LIDOCAINE HCL (PF) 1 % IJ SOLN
INTRAMUSCULAR | Status: AC
  Filled 2017-12-10: qty 30

## 2017-12-10 MED ORDER — ROSUVASTATIN CALCIUM 10 MG PO TABS
20.0000 mg | ORAL_TABLET | Freq: Every day | ORAL | Status: DC
  Administered 2017-12-11 – 2017-12-17 (×6): 20 mg via ORAL
  Filled 2017-12-10 (×2): qty 2
  Filled 2017-12-10 (×2): qty 1
  Filled 2017-12-10 (×3): qty 2
  Filled 2017-12-10: qty 1

## 2017-12-10 MED ORDER — SODIUM CHLORIDE 0.9 % WEIGHT BASED INFUSION
3.0000 mL/kg/h | INTRAVENOUS | Status: DC
  Administered 2017-12-10: 3 mL/kg/h via INTRAVENOUS

## 2017-12-10 MED ORDER — ASPIRIN 81 MG PO CHEW: 81.0000 mg | CHEWABLE_TABLET | ORAL | Status: DC

## 2017-12-10 MED ORDER — HEPARIN (PORCINE) IN NACL 1000-0.9 UT/500ML-% IV SOLN
INTRAVENOUS | Status: AC
  Filled 2017-12-10: qty 1000

## 2017-12-10 MED ORDER — SODIUM CHLORIDE 0.9 % WEIGHT BASED INFUSION: 3.0000 mL/kg/h | INTRAVENOUS | Status: DC

## 2017-12-10 MED ORDER — VERAPAMIL HCL 2.5 MG/ML IV SOLN
INTRAVENOUS | Status: DC | PRN
  Administered 2017-12-10: 10 mL via INTRA_ARTERIAL

## 2017-12-10 MED ORDER — ACETAMINOPHEN 325 MG PO TABS: 650.0000 mg | ORAL_TABLET | ORAL | Status: DC | PRN

## 2017-12-10 MED ORDER — MIDAZOLAM HCL 2 MG/2ML IJ SOLN
INTRAMUSCULAR | Status: DC | PRN
  Administered 2017-12-10: 1 mg via INTRAVENOUS

## 2017-12-10 MED ORDER — IOHEXOL 350 MG/ML SOLN
INTRAVENOUS | Status: DC | PRN
  Administered 2017-12-10: 60 mL via INTRA_ARTERIAL

## 2017-12-10 MED ORDER — HEPARIN (PORCINE) IN NACL 2-0.9 UNITS/ML
INTRAMUSCULAR | Status: AC | PRN
  Administered 2017-12-10 (×2): 500 mL

## 2017-12-10 MED ORDER — SODIUM CHLORIDE 0.9 % WEIGHT BASED INFUSION: 1.0000 mL/kg/h | INTRAVENOUS | Status: DC

## 2017-12-10 MED ORDER — SODIUM CHLORIDE 0.9% FLUSH: 3.0000 mL | Freq: Two times a day (BID) | INTRAVENOUS | Status: DC

## 2017-12-10 MED ORDER — FENTANYL CITRATE (PF) 100 MCG/2ML IJ SOLN
INTRAMUSCULAR | Status: DC | PRN
  Administered 2017-12-10: 50 ug via INTRAVENOUS

## 2017-12-10 MED ORDER — ADULT MULTIVITAMIN W/MINERALS CH
1.0000 | ORAL_TABLET | Freq: Every day | ORAL | Status: DC
  Administered 2017-12-10 – 2017-12-11 (×2): 1 via ORAL
  Filled 2017-12-10: qty 1

## 2017-12-10 MED ORDER — SODIUM CHLORIDE 0.9 % IV SOLN
250.0000 mL | INTRAVENOUS | Status: DC | PRN
Start: 2017-12-10 — End: 2017-12-12

## 2017-12-10 MED ORDER — VERAPAMIL HCL 2.5 MG/ML IV SOLN
INTRAVENOUS | Status: AC
  Filled 2017-12-10: qty 2

## 2017-12-10 MED ORDER — NITROGLYCERIN 0.4 MG SL SUBL: 0.4000 mg | SUBLINGUAL_TABLET | SUBLINGUAL | Status: DC | PRN

## 2017-12-10 MED ORDER — HEPARIN (PORCINE) IN NACL 100-0.45 UNIT/ML-% IJ SOLN
1500.0000 [IU]/h | INTRAMUSCULAR | Status: DC
  Administered 2017-12-10: 1300 [IU]/h via INTRAVENOUS
  Filled 2017-12-10 (×2): qty 250

## 2017-12-10 MED ORDER — HEPARIN SODIUM (PORCINE) 1000 UNIT/ML IJ SOLN
INTRAMUSCULAR | Status: DC | PRN
  Administered 2017-12-10: 5000 [IU] via INTRAVENOUS

## 2017-12-10 MED ORDER — FENTANYL CITRATE (PF) 100 MCG/2ML IJ SOLN
INTRAMUSCULAR | Status: AC
  Filled 2017-12-10: qty 2

## 2017-12-10 MED ORDER — ISOSORBIDE MONONITRATE ER 30 MG PO TB24
30.0000 mg | ORAL_TABLET | Freq: Every day | ORAL | Status: DC
  Administered 2017-12-10 – 2017-12-11 (×2): 30 mg via ORAL
  Filled 2017-12-10 (×2): qty 1

## 2017-12-10 MED ORDER — SODIUM CHLORIDE 0.9 % IV SOLN: 250.0000 mL | INTRAVENOUS | Status: DC | PRN

## 2017-12-10 MED ORDER — HEPARIN SODIUM (PORCINE) 1000 UNIT/ML IJ SOLN
INTRAMUSCULAR | Status: AC
  Filled 2017-12-10: qty 1

## 2017-12-10 MED ORDER — ZOLPIDEM TARTRATE 5 MG PO TABS: 5.0000 mg | ORAL_TABLET | Freq: Every evening | ORAL | Status: DC | PRN

## 2017-12-10 MED ORDER — MELATONIN 3 MG PO TABS
9.0000 mg | ORAL_TABLET | Freq: Every day | ORAL | Status: DC
  Administered 2017-12-11: 9 mg via ORAL
  Filled 2017-12-10 (×2): qty 3

## 2017-12-10 MED ORDER — LIDOCAINE HCL (PF) 1 % IJ SOLN
INTRAMUSCULAR | Status: DC | PRN
  Administered 2017-12-10: 1 mL via SUBCUTANEOUS

## 2017-12-10 MED ORDER — ONDANSETRON HCL 4 MG/2ML IJ SOLN
4.0000 mg | Freq: Four times a day (QID) | INTRAMUSCULAR | Status: DC | PRN
Start: 2017-12-10 — End: 2017-12-12

## 2017-12-10 MED ORDER — SODIUM CHLORIDE 0.9% FLUSH
3.0000 mL | Freq: Two times a day (BID) | INTRAVENOUS | Status: DC
  Administered 2017-12-10 – 2017-12-11 (×3): 3 mL via INTRAVENOUS

## 2017-12-10 MED ORDER — ASPIRIN EC 81 MG PO TBEC
81.0000 mg | DELAYED_RELEASE_TABLET | Freq: Every day | ORAL | Status: DC
  Administered 2017-12-11: 81 mg via ORAL
  Filled 2017-12-10: qty 1

## 2017-12-10 MED ORDER — FUROSEMIDE 10 MG/ML IJ SOLN
20.0000 mg | Freq: Every day | INTRAMUSCULAR | Status: DC
  Administered 2017-12-10 – 2017-12-11 (×2): 20 mg via INTRAVENOUS
  Filled 2017-12-10 (×2): qty 2

## 2017-12-10 MED ORDER — MIDAZOLAM HCL 2 MG/2ML IJ SOLN
INTRAMUSCULAR | Status: AC
  Filled 2017-12-10: qty 2

## 2017-12-10 MED ORDER — SODIUM CHLORIDE 0.9% FLUSH: 3.0000 mL | INTRAVENOUS | Status: DC | PRN

## 2017-12-10 MED ORDER — MIRABEGRON ER 25 MG PO TB24
25.0000 mg | ORAL_TABLET | Freq: Every day | ORAL | Status: DC
  Administered 2017-12-10 – 2017-12-11 (×2): 25 mg via ORAL
  Filled 2017-12-10 (×3): qty 1

## 2017-12-10 MED ORDER — DOCUSATE SODIUM 100 MG PO CAPS
300.0000 mg | ORAL_CAPSULE | Freq: Every day | ORAL | Status: DC
  Administered 2017-12-11: 300 mg via ORAL
  Filled 2017-12-10: qty 3

## 2017-12-10 MED ORDER — CARVEDILOL 3.125 MG PO TABS
3.1250 mg | ORAL_TABLET | Freq: Two times a day (BID) | ORAL | Status: DC
  Administered 2017-12-10 – 2017-12-11 (×3): 3.125 mg via ORAL
  Filled 2017-12-10 (×3): qty 1

## 2017-12-10 SURGICAL SUPPLY — 12 items
CATH INFINITI 5 FR JL3.5 (CATHETERS) ×2 IMPLANT
CATH INFINITI JR4 5F (CATHETERS) ×2 IMPLANT
DEVICE RAD COMP TR BAND LRG (VASCULAR PRODUCTS) ×2 IMPLANT
GUIDEWIRE INQWIRE 1.5J.035X260 (WIRE) ×1 IMPLANT
INQWIRE 1.5J .035X260CM (WIRE) ×2
KIT HEART LEFT (KITS) ×2 IMPLANT
NEEDLE PERC 21GX4CM (NEEDLE) ×2 IMPLANT
PACK CARDIAC CATHETERIZATION (CUSTOM PROCEDURE TRAY) ×2 IMPLANT
SHEATH RAIN RADIAL 21G 6FR (SHEATH) ×2 IMPLANT
TRANSDUCER W/STOPCOCK (MISCELLANEOUS) ×2 IMPLANT
TUBING CIL FLEX 10 FLL-RA (TUBING) ×2 IMPLANT
WIRE HI TORQ VERSACORE-J 145CM (WIRE) ×2 IMPLANT

## 2017-12-10 NOTE — Progress Notes (Signed)
[facility] BAND REMOVAL  LOCATION:    Right  radial  DEFLATED PER PROTOCOL:    Yes.    TIME BAND OFF / DRESSING APPLIED:    1430p  Gauze with tegaderm   SITE UPON ARRIVAL:    Level 0  SITE AFTER BAND REMOVAL:    Level 0  CIRCULATION SENSATION AND MOVEMENT:    Within Normal Limits   Yes.    COMMENTS:   Pt able to move ext freely and without any discomfort.

## 2017-12-10 NOTE — Brief Op Note (Signed)
BRIEF CARDIAC CATHETERIZATION NOTE  DATE: 12/10/2017 TIME: 10:59 AM  PATIENT:  [name]  77 y.o. male  PRE-OPERATIVE DIAGNOSIS:  Atypical chest pain, cardiomyopathy, and abnormal stress test  POST-OPERATIVE DIAGNOSIS:  Unstable angina, ischemic cardiomyopathy  PROCEDURE:  Procedure(s): LEFT HEART CATH AND CORONARY ANGIOGRAPHY (N/A)  SURGEON:  Surgeon(s) and Role:    [name], MD - Primary  FINDINGS: 1. Significant 2-vessel CAD, including CTO of the proximal LAD and 80-90% proximal codominant LCx stenosis. 2. Moderately elevated LVEDP (LVEF 40-45% by echo last month).  RECOMMENDATIONS: 1. Admit to telemetry for medical therapy and cardiac surgery consultation for CABG. 2. Gentle diuresis and optimization of HF therapy.  [name], MD [facility] Pager: [phone]

## 2017-12-10 NOTE — Interval H&P Note (Signed)
History and Physical Interval Note:  12/10/2017 9:59 AM  Phillip [name]  has presented today for cardiac catheterization, with the diagnosis of atypical chest pain and abnormal stress test.  The various methods of treatment have been discussed with the patient and family. After consideration of risks, benefits and other options for treatment, the patient has consented to  Procedure(s): LEFT HEART CATH AND CORONARY ANGIOGRAPHY (N/A) as a surgical intervention .  [name] history has been reviewed, patient examined, no change in status, stable for surgery.  Today, he reports intermittent left arm pain with accompanying vague discomfort across the chest when seated in his recliner.  He has stable exertional dyspnea without exertional chest pain.  I have reviewed the patient's chart and labs.  Questions were answered to the patient's satisfaction.    Cath Lab Visit (complete for each Cath Lab visit)  Clinical Evaluation Leading to the Procedure:   ACS: No.  Non-ACS:    Anginal Classification: CCS III  Anti-ischemic medical therapy: Minimal Therapy (1 class of medications) - has not started carvedilol yet  Non-Invasive Test Results: High-risk stress test findings: cardiac mortality >3%/year  Prior CABG: No previous CABG  Phillip [name]

## 2017-12-10 NOTE — Progress Notes (Signed)
[address]illip [name] [address]       [place] [address]             [phone]        Phillip [name] [facility] Medical Record #[id number] Date of Birth: [birth year]  Referring: No ref. provider found Primary Care: Phillip Bush, MD Primary Cardiologist:Phillip End, MD  Chief Complaint: LV dysfunction with severe LAD and circumflex disease   History of Present Illness:        Current Activity/ Functional Status: Patient is independent with mobility/ambulation, transfers, ADL's, IADL's.   Zubrod Score: At the time of surgery this patient's most appropriate activity status/level should be described as: []     0    Normal activity, no symptoms [x]     1    Restricted in physical strenuous activity but ambulatory, able to do out light work []     2    Ambulatory and capable of self care, unable to do work activities, up and about                 more than 50%  Of the time                            []     3    Only limited self care, in bed greater than 50% of waking hours []     4    Completely disabled, no self care, confined to bed or chair []     5    Moribund  Past Medical History:  Diagnosis Date  . Anxiety   . Cardiomyopathy ([place])    a. 10/2017: echo showing reduced EF of 40-45%, HK of the anterior, anteroseptal and apical myocardium with Grade 1 DD.   [place] Kitchen Coronary artery disease 2003   a. nonobstructive CAD by cath in 2003 and 2005  . Diarrhea   . Hearing loss   . History of benign prostatic hypertrophy   . History of radiation therapy 01/31/10-03/22/10   right tonsil/right neck node 7000 cGy 35 sessions, high risk lymph node volume 5940 cGy 35 sessions, low risk lymph node vol 5600 cGy 35 sessions  . 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 HTN (hypertension)   . Hyperlipidemia    diet controlled- taking medication d/t ensure drinking  . Hypothyroid 01/16/2013  . Neck pain   . Sinus bradycardia   . Squamous cell carcinoma    right tonsil- 2011  . Thrombocytopenia ([place])  02/02/2012   unclear etiology  . Tinnitus     Past Surgical History:  Procedure Laterality Date  . CARDIAC CATHETERIZATION  2003, 2005   40% blockage, two 30% blockages treated medically Phillip [name])  . COLONOSCOPY  03/2015   TA x3, HP, melanosis coli, severe diverticulosis, rpt 3 yrs (Phillip [name])  . DENTAL SURGERY     extractions  . KNEE ARTHROSCOPY Left   . PEG PLACEMENT  02/2010  . TONSILLECTOMY    . VASECTOMY      Social History   Tobacco Use  Smoking Status Never Smoker  Smokeless Tobacco Never Used    Social History   Substance and Sexual Activity  Alcohol Use Yes   Comment: 6 glasses of wine per year    Social History   Socioeconomic History  . Marital status: Married    Spouse name: Not on file  . Number of children: Not on file  . Years of education: Not on  file  . Highest education level: Not on file  Occupational History  . Not on file  Social Needs  . Financial resource strain: Not on file  . Food insecurity:    Worry: Not on file    Inability: Not on file  . Transportation needs:    Medical: Not on file    Non-medical: Not on file  Tobacco Use  . Smoking status: Never Smoker  . Smokeless tobacco: Never Used  Substance and Sexual Activity  . Alcohol use: Yes    Comment: 6 glasses of wine per year  . Drug use: No  . 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    Plays golf, remains very active with an interest in politics. Jan 2011 moved to area.    Involved in [employer].    He is a runner - has run [employer] and [employer]. S/p torn meniscus.     Allergies  Allergen Reactions  . Ace Inhibitors Swelling and Other (See Comments)    Possible angioedema (upper lip swelling)  . Morphine And Related Swelling    Current Facility-Administered  Medications  Medication Dose Route Frequency Provider Last Rate Last Dose  . 0.9 %  sodium chloride infusion  250 mL Intravenous PRN [name], Phillip Gave, MD      . 0.9% sodium chloride infusion  1 mL/kg/hr Intravenous Continuous [name], Christopher, MD 94.3 mL/hr at 12/10/17 0837 1 mL/kg/hr at 12/10/17 0837  . [START ON 12/11/2017] aspirin chewable tablet 81 mg  81 mg Oral Pre-Cath [name], Christopher, MD      . ondansetron [facility]) injection 4 mg  4 mg Intravenous Q6H PRN [name], Phillip Gave, MD      . sodium chloride flush (NS) 0.9 % injection 3 mL  3 mL Intravenous Q12H [name], Christopher, MD      . sodium chloride flush (NS) 0.9 % injection 3 mL  3 mL Intravenous PRN [name], Christopher, MD        Medications Prior to Admission  Medication Sig Dispense Refill Last Dose  . amLODipine (NORVASC) 10 MG tablet Take 0.5 tablets (5 mg total) by mouth daily. (Patient taking differently: Take 10 mg by mouth daily. )   12/09/2017 at Unknown time  . aspirin 81 MG tablet Take 81 mg by mouth daily.     12/10/2017 at 0815  . B Complex-C (SUPER B COMPLEX PO) Take 1 tablet by mouth daily.   12/09/2017 at Unknown time  . Cholecalciferol (VITAMIN D) 2000 units CAPS Take 2,000 Units by mouth daily.    12/09/2017 at Unknown time  . Docusate Sodium (STOOL SOFTENER) 100 MG capsule Take 350 mg by mouth daily.    12/09/2017 at Unknown time  . ibuprofen (ADVIL,MOTRIN) 200 MG tablet Take 400 mg by mouth every 6 (six) hours as needed for headache or moderate pain.   Past Week at Unknown time  . Magnesium 250 MG TABS Take 500 mg by mouth daily.    12/09/2017 at Unknown time  . MELATONIN PO Take 10 mg by mouth at bedtime.    12/09/2017 at Unknown time  . mirabegron ER (MYRBETRIQ) 25 MG TB24 tablet Take 25 mg by mouth daily.   12/09/2017 at Unknown time  . Multiple Vitamin (MULTIVITAMIN) tablet Take 1 tablet by mouth daily.   12/09/2017 at Unknown  time  . Omega 3-6-9 Fatty Acids (TRIPLE OMEGA-3-6-9 PO) Take 1 capsule by mouth daily.   12/10/2017  at 0600  . POTASSIUM GLUCONATE PO Take 198 mg by mouth daily.    12/09/2017 at Unknown time  . rosuvastatin (CRESTOR) 20 MG tablet Take 1 tablet (20 mg total) by mouth daily. 90 tablet 3 12/10/2017 at 0600  . vitamin C (ASCORBIC ACID) 500 MG tablet Take 1,000 mg by mouth daily.    12/09/2017 at Unknown time  . vitamin E 400 UNIT capsule Take 400 Units by mouth daily.   12/09/2017 at Unknown time  . carvedilol (COREG) 3.125 MG tablet Take 1 tablet (3.125 mg total) by mouth 2 (two) times daily with a meal. (Patient not taking: Reported on 12/03/2017) 60 tablet 5 Not Taking at Unknown time  . zaleplon (SONATA) 10 MG capsule Take 1 capsule (10 mg total) by mouth as needed for sleep. (Patient taking differently: Take 10 mg by mouth at bedtime as needed for sleep. ) 90 capsule 0 More than a month at Unknown time    Family History  Problem Relation Age of Onset  . Coronary artery disease Father   . Heart attack Father 60  . Heart disease Father   . Colon cancer Neg Hx   . Esophageal cancer Neg Hx   . Rectal cancer Neg Hx   . Stomach cancer Neg Hx      Review of Systems:   Pertinent items are noted in HPI.     Cardiac Review of Systems: Y or  [    ]= no  Chest Pain [ mild    ]  Resting SOB [ n  ] Exertional SOB  Blue.Reese  ]  Orthopnea n ]   Pedal Edema [ y  ]    Palpitations [n  ] Syncope  [ n   Presyncope [ n  ]  General Review of Systems: [Y] = yes [  ]=no Constitional: recent weight change [n]; anorexia [ n ]; fatigue [ n ]; nausea [  ]; night sweats [  ]; fever [  ]; or chills [  ]                                                               Dental: poor dentition[  ]; Last Dentist visit:   Eye : blurred vision [  n]; diplopia [  n ]; vision changes [  ];  Amaurosis fugax[  n]; Resp: cough [ n ];  wheezing[n  ];  hemoptysis[n  ]; shortness of breath[ y ]; paroxysmal nocturnal dyspnea[n  ]; dyspnea on exertion[  ]; or orthopnea[  ];  GI:  gallstones[  ], vomiting[  ];  dysphagia[  ]; melena[  ];   hematochezia [  ]; heartburn[  ];   Hx of  Colonoscopy[  ]; GU: kidney stones [  ]; hematuria[  ];   dysuria [  ];  nocturia[  ];  history of     obstruction [  ]; urinary frequency [  ]             Skin: rash, swelling[  ];, hair loss[  ];  peripheral edema[  ];  or itching[  ]; Musculosketetal: myalgias[  ];  joint swelling[  ];  joint erythema[  ];  joint pain[  ];  back pain[  ];  Heme/Lymph: bruising[n  ];  bleeding[n ];  anemia[n  ];  Neuro: TIA[ n ];  headaches[ n ];  stroke[  n];  vertigo[  ];  seizures[n  ];   paresthesias[  ];  difficulty walking[n ];  Psych:depression[ n ]; anxiety[y ];  Endocrine: diabetes[ n ];  thyroid dysfunction[n  ];  Immunizations: Flu [  ]; Pneumococcal[  ];    Physical Exam: BP 128/64   Pulse (!) 56   Temp 98.5 F (36.9 C) (Oral)   Resp (!) 23   Ht 6\' 2"  (1.88 m)   Wt 208 lb (94.3 kg)   SpO2 95%   BMI 26.71 kg/m    General appearance: alert, cooperative, appears stated age and no distress Head: Normocephalic, without obvious abnormality, atraumatic Neck: no adenopathy, no carotid bruit, no JVD, supple, symmetrical, trachea midline and thyroid not enlarged, symmetric, no tenderness/mass/nodules Lymph nodes: Cervical, supraclavicular, and axillary nodes normal. Resp: clear to auscultation bilaterally Back: symmetric, no curvature. ROM normal. No CVA tenderness. Cardio: systolic murmur: early systolic 2/6, crescendo at apex GI: soft, non-tender; bowel sounds normal; no masses,  no organomegaly Extremities: extremities normal, atraumatic, no cyanosis or edema, Homans sign is negative, no sign of DVT and Palpable DP and PT pulses bilaterally, suitable vein for bypass or extremities.  Intact right radial cath site dressing in place  Diagnostic Studies & Laboratory data:     Recent Radiology Findings:   No results found.  NM myocardial perfusion  study:   Blood pressure demonstrated a normal response to exercise.  Defect 1: There is a large  defect of severe severity present in the mid anterior, mid anteroseptal, apical anterior, apical septal and apex location.  Findings consistent with prior myocardial infarction with significant peri-infarct ischemia.  This is a high risk study.  Nuclear stress EF: 34%.   I have independently reviewed the above radiologic studies.  Recent Lab Findings: Lab Results  Component Value Date   WBC 5.0 12/03/2017   HGB 15.5 12/03/2017   HCT 44.8 12/03/2017   PLT 147 (L) 12/03/2017   GLUCOSE 112 (H) 12/03/2017   CHOL 138 11/07/2017   TRIG 144.0 11/07/2017   HDL 44.40 11/07/2017   LDLDIRECT 101.0 10/13/2014   LDLCALC 65 11/07/2017   ALT 17 11/07/2017   AST 21 11/07/2017   NA 140 12/03/2017   K 5.1 12/03/2017   CL 107 12/03/2017   CREATININE 0.78 12/03/2017   BUN 21 (H) 12/03/2017   CO2 29 12/03/2017   TSH 4.84 (H) 11/07/2017   HGBA1C 6.0 11/07/2017   ECHO 10/2017  Transthoracic Echocardiography  Patient:    Phillip [name], Phillip [name] MR #:       [id number] Study Date: 11/08/2017 Gender:     M Age:        77 Height:     184.2 cm Weight:     94.5 kg BSA:        2.21 m^2 Pt. Status: Room:   ATTENDING    Default, Provider [phone]  [place] End, MD  REFERRING    [name], MD  PERFORMING   [place], [place]  SONOGRAPHER  Pilar Jarvis, RVT, RDCS, RDMS  cc:  ------------------------------------------------------------------- LV EF: 40% -   45%  ------------------------------------------------------------------- History:   PMH:  LVH.  Murmur.  Coronary artery disease.  Risk factors:  Lifelong nonsmoker. Hypertension. Dyslipidemia.  ------------------------------------------------------------------- Study Conclusions  -  Left ventricle: The cavity size was normal. There was mild   concentric hypertrophy. Systolic function was mildly to   moderately reduced. The estimated ejection fraction was in the   range of 40% to 45%. Hypokinesis of the anterior,  anteroseptal   and apical myocardium. Doppler parameters are consistent with   abnormal left ventricular relaxation (grade 1 diastolic   dysfunction). - Left atrium: The atrium was mildly dilated. - Right ventricle: Systolic function was normal. - Pulmonary arteries: Systolic pressure was within the normal   range.  ------------------------------------------------------------------- Study data:  No prior study was available for comparison.  Study status:  Routine.  Procedure:  Transthoracic echocardiography. Image quality was good.  Study completion:  There were no complications.          Transthoracic echocardiography.  M-mode, complete 2D, spectral Doppler, and color Doppler.  Birthdate: Patient birthdate: 09-10-1940.  Age:  Patient is 77 yr old.  Sex: Gender: male.    BMI: 27.8 kg/m^2.  Blood pressure:     138/82 Patient status:  Outpatient.  Study date:  Study date: 11/08/2017. Study time: 08:03 AM.  -------------------------------------------------------------------  ------------------------------------------------------------------- Left ventricle:  The cavity size was normal. There was mild concentric hypertrophy. Systolic function was mildly to moderately reduced. The estimated ejection fraction was in the range of 40% to 45%.  Regional wall motion abnormalities:  Hypokinesis of the anterior, anteroseptal and apical myocardium. Doppler parameters are consistent with abnormal left ventricular relaxation (grade 1 diastolic dysfunction).  ------------------------------------------------------------------- Aortic valve:   Trileaflet; normal thickness leaflets. Mobility was not restricted.  Doppler:  Transvalvular velocity was within the normal range. There was no stenosis. There was no regurgitation. VTI ratio of LVOT to aortic valve: 0.71. Valve area (VTI): 2.46 cm^2. Indexed valve area (VTI): 1.11 cm^2/m^2. Mean velocity ratio of LVOT to aortic valve: 0.61. Valve area  (Vmean): 2.12 cm^2. Indexed valve area (Vmean): 0.96 cm^2/m^2.    Mean gradient (S): 8 mm Hg.  ------------------------------------------------------------------- Aorta:  Aortic root: The aortic root was normal in size.  ------------------------------------------------------------------- Mitral valve:   Structurally normal valve.   Mobility was not restricted.  Doppler:  Transvalvular velocity was within the normal range. There was no evidence for stenosis. There was trivial regurgitation.    Valve area by pressure half-time: 2.86 cm^2. Indexed valve area by pressure half-time: 1.29 cm^2/m^2.    Peak gradient (D): 2 mm Hg.  ------------------------------------------------------------------- Left atrium:  The atrium was mildly dilated.  ------------------------------------------------------------------- Right ventricle:  The cavity size was normal. Wall thickness was normal. Systolic function was normal.  ------------------------------------------------------------------- Pulmonic valve:    Structurally normal valve.   Cusp separation was normal.  Doppler:  Transvalvular velocity was within the normal range. There was no evidence for stenosis. There was no regurgitation.  ------------------------------------------------------------------- Tricuspid valve:   Structurally normal valve.    Doppler: Transvalvular velocity was within the normal range. There was trivial regurgitation.  ------------------------------------------------------------------- Pulmonary artery:   The main pulmonary artery was normal-sized. Systolic pressure was within the normal range.  ------------------------------------------------------------------- Right atrium:  The atrium was normal in size.  ------------------------------------------------------------------- Pericardium:  There was no pericardial effusion.  ------------------------------------------------------------------- Systemic  veins: Inferior vena cava: The vessel was dilated. The respirophasic diameter changes were blunted (< 50%), consistent with elevated central venous pressure.  ------------------------------------------------------------------- Measurements   Left ventricle                           Value  Reference  LV ID, ED, PLAX chordal          (H)     54    mm       43 - 52  LV ID, ES, PLAX chordal                  35    mm       23 - 38  LV fx shortening, PLAX chordal           35    %        >=29  LV PW thickness, ED                      13    mm       ----------  IVS/LV PW ratio, ED                      1.15           <=1.3  Stroke volume, 2D                        107   ml       ----------  Stroke volume/bsa, 2D                    48    ml/m^2   ----------  LV ejection fraction, 1-p A4C            49    %        ----------  LV e&', lateral                           5.98  cm/s     ----------  LV E/e&', lateral                         12.74          ----------  LV e&', medial                            6.53  cm/s     ----------  LV E/e&', medial                          11.67          ----------  LV e&', average                           6.26  cm/s     ----------  LV E/e&', average                         12.18          ----------    Ventricular septum                       Value          Reference  IVS thickness, ED                        15    mm       ----------    LVOT  Value          Reference  LVOT ID, S                               21    mm       ----------  LVOT area                                3.46  cm^2     ----------  LVOT ID                                  21    mm       ----------  LVOT mean velocity, S                    84.6  cm/s     ----------  LVOT VTI, S                              30.9  cm       ----------  Stroke volume (SV), LVOT DP              107   ml       ----------  Stroke index (SV/bsa), LVOT DP           48.3   ml/m^2   ----------    Aortic valve                             Value          Reference  Aortic valve mean velocity, S            138   cm/s     ----------  Aortic valve VTI, S                      43.4  cm       ----------  Aortic mean gradient, S                  8     mm Hg    ----------  VTI ratio, LVOT/AV                       0.71           ----------  Aortic valve area, VTI                   2.46  cm^2     ----------  Aortic valve area/bsa, VTI               1.11  cm^2/m^2 ----------  Velocity ratio, mean, LVOT/AV            0.61           ----------  Aortic valve area, mean velocity         2.12  cm^2     ----------  Aortic valve area/bsa, mean              0.96  cm^2/m^2 ----------  velocity    Aorta  Value          Reference  Aortic root ID, ED                       35    mm       ----------  Ascending aorta ID, A-P, S               31    mm       ----------    Left atrium                              Value          Reference  LA ID, A-P, ES                           50    mm       ----------  LA ID/bsa, A-P                   (H)     2.26  cm/m^2   <=2.2  LA volume, S                             91.8  ml       ----------  LA volume/bsa, S                         41.5  ml/m^2   ----------  LA volume, ES, 1-p A4C                   69.2  ml       ----------  LA volume/bsa, ES, 1-p A4C               31.3  ml/m^2   ----------  LA volume, ES, 1-p A2C                   108   ml       ----------  LA volume/bsa, ES, 1-p A2C               48.8  ml/m^2   ----------    Mitral valve                             Value          Reference  Mitral E-wave peak velocity              76.2  cm/s     ----------  Mitral A-wave peak velocity              100   cm/s     ----------  Mitral deceleration time         (H)     264   ms       150 - 230  Mitral pressure half-time                77    ms       ----------  Mitral peak gradient, D                  2     mm  Hg    ----------  Mitral E/A ratio, peak  0.8            ----------  Mitral valve area, PHT, DP               2.86  cm^2     ----------  Mitral valve area/bsa, PHT, DP           1.29  cm^2/m^2 ----------    Right atrium                             Value          Reference  RA ID, S-I, ES, A4C              (H)     53.1  mm       34 - 49  RA area, ES, A4C                 (H)     21    cm^2     8.3 - 19.5  RA volume, ES, A/L                       66.1  ml       ----------  RA volume/bsa, ES, A/L                   29.9  ml/m^2   ----------    Right ventricle                          Value          Reference  TAPSE                                    30.5  mm       ----------  RV s&', lateral, S                        17.1  cm/s     ----------    Pulmonic valve                           Value          Reference  Pulmonic valve peak velocity, S          97    cm/s     ----------  Legend: (L)  and  (H)  mark values outside specified reference range.  ------------------------------------------------------------------- Prepared and Electronically Authenticated by  Esmond Plants, MD, [facility] 2019-03-21T17:42:43   CATH 12/10/2017: LEFT HEART CATH AND CORONARY ANGIOGRAPHY  Conclusion   Conclusions: 1. Significant 2-vessel coronary artery disease with chronic total occlusion of proximal LAD and 80-90% proximal codominant LCx stenosis.  The LMCA as well as the proximal and mid LAD/LCx are heavily calcified.  The mid/distal LAD fill via left-to-left and right-to-left collaterals. 2. Moderately elevated left ventricular filling pressure.  LVEF known to be moderately reduced by recent echo.  Recommendations: 1. Given recent chest pain at rest and severe proximal LAD and LCx disease (LMCA-equivalent), I will admit Phillip [name] for medical optimization and cardiac surgery consultation for CABG. 2. Initiate heparin infusion in 8 hours. 3. Hold amlodipine, given low LVEF, and initiate  isosorbide mononitrate 30 mg daily. 4. Start carvedilol 3.125 mg BID.  Defer adding ACEI/ARB  given history of angioedema with lisinopril. 5. Gentle diuresis. Furosemide 20 mg IV daily ordered.  [name], MD [facility] Pager: [phone]   Hemo Data    Most Recent Value  LV Systolic Pressure 440 mmHg  LV Diastolic Pressure 15 mmHg  LV EDP 30 mmHg  Arterial Occlusion Pressure Extended Systolic Pressure 102 mmHg  Arterial Occlusion Pressure Extended Diastolic Pressure 67 mmHg  Arterial Occlusion Pressure Extended Mean Pressure 89 mmHg  Left Ventricular Apex Extended Systolic Pressure 725 mmHg  Left Ventricular Apex Extended Diastolic Pressure 14 mmHg  Left Ventricular Apex Extended EDP Pressure 30 mmHg   I have independently reviewed the above  cath films and reviewed the findings with the  patient .   Assessment / Plan:   1/ Significant 2-vessel coronary artery disease with chronic total occlusion of proximal LAD  2/ Depressed EF - estimated ejection fraction was in the range of 40% to 45%.  Regional wall motion abnormalities:Hypokinesis of the anterior, anteroseptal and apical myocardium.  I reviewed with the patient the findings of his cardiac catheterization and echocardiogram and nuclear stress test with significant coronary artery disease with total occlusion of the and high-grade stenosis of the proximal circumflex branches, left main equivalent with depressed LV dysfunction.  Agree with recommendation to cardiology to consider coronary artery bypass grafting as the best way to preserve LV function symptoms.  Risks and options of surgery are discussed with the patient in detail.  He is agreeable to proceeding. Plan for Wednesday, April 24.    [name] MD      [address] [place] [address] Office [phone]   Murlean Hark [phone]  12/10/2017 2:25 PM     Patient ID: Phillip [name], male   DOB: 27-Aug-1940, 77 y.o.   MRN:  [id number]

## 2017-12-10 NOTE — Progress Notes (Signed)
ANTICOAGULATION CONSULT NOTE - Initial Consult  Pharmacy Consult for Heparin Indication: CAD, awaiting CABG  Allergies  Allergen Reactions  . Ace Inhibitors Swelling and Other (See Comments)    Possible angioedema (upper lip swelling)  . Morphine And Related Swelling    Patient Measurements: Height: 6\' 2"  (188 cm) Weight: 204 lb 1.6 oz (92.6 kg) IBW/kg (Calculated) : 82.2 Heparin Dosing Weight: 92 kg  Vital Signs: Temp: 98.5 F (36.9 C) (04/22 0750) Temp Source: Oral (04/22 0750) BP: 178/95 (04/22 1629) Pulse Rate: 69 (04/22 1445)  Labs from 12/09/17:  Hgb 15.5, platelet count 147 (seems to run ~110s-150s)  Scr 0.78  Estimated Creatinine Clearance: 88.5 mL/min (by C-G formula based on SCr of 0.78 mg/dL).   Medical History: Past Medical History:  Diagnosis Date  . Anxiety   . Cardiomyopathy ([place])    a. 10/2017: echo showing reduced EF of 40-45%, HK of the anterior, anteroseptal and apical myocardium with Grade 1 DD.   Marland Kitchen Coronary artery disease 2003   a. nonobstructive CAD by cath in 2003 and 2005  . Diarrhea   . Hearing loss   . History of benign prostatic hypertrophy   . History of radiation therapy 01/31/10-03/22/10   right tonsil/right neck node 7000 cGy 35 sessions, high risk lymph node volume 5940 cGy 35 sessions, low risk lymph node vol 5600 cGy 35 sessions  . 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 HTN (hypertension)   . Hyperlipidemia    diet controlled- taking medication d/t ensure drinking  . Hypothyroid 01/16/2013  . Neck pain   . Sinus bradycardia   . Squamous cell carcinoma    right tonsil- 2011  . Thrombocytopenia ([place]) 02/02/2012   unclear etiology  . Tinnitus    Assessment:  77 yr old male s/p cardiac cath today, found to have significant 2v CAD.  TCTS consulted, CABG scheduled for 12/12/17.  Hx thrombocytopenia. Platelet count 145 on 12/09/17.  Seems to run 110s-150s.    Heparin to begin 8 hrs after sheath out.  Sheath out ~11am.  TR band deflation completed   ~2:30pm. No bleeding or hematoma reported.  Goal of Therapy:  Heparin level 0.3-0.7 units/ml Monitor platelets by anticoagulation protocol: Yes   Plan:   Heparin to begin ~7pm at 1300 units/hr. No bolus post-cath.  Heparin level ~8 hrs after drip begins.  Daily heparin level and CBC while on heparin.  Arty Baumgartner, [place] Pager: [phone] 12/10/2017,5:51 PM

## 2017-12-11 ENCOUNTER — Encounter (HOSPITAL_COMMUNITY): Payer: Self-pay | Admitting: Internal Medicine

## 2017-12-11 ENCOUNTER — Inpatient Hospital Stay (HOSPITAL_COMMUNITY): Payer: PPO

## 2017-12-11 DIAGNOSIS — I200 Unstable angina: Secondary | ICD-10-CM

## 2017-12-11 DIAGNOSIS — R9439 Abnormal result of other cardiovascular function study: Secondary | ICD-10-CM

## 2017-12-11 DIAGNOSIS — Z01810 Encounter for preprocedural cardiovascular examination: Secondary | ICD-10-CM

## 2017-12-11 LAB — HEPARIN LEVEL (UNFRACTIONATED)
HEPARIN UNFRACTIONATED: 0.17 [IU]/mL — AB (ref 0.30–0.70)
Heparin Unfractionated: 0.45 IU/mL (ref 0.30–0.70)
Heparin Unfractionated: 0.56 IU/mL (ref 0.30–0.70)

## 2017-12-11 LAB — COMPREHENSIVE METABOLIC PANEL
ALT: 18 U/L (ref 17–63)
AST: 19 U/L (ref 15–41)
Albumin: 3.4 g/dL — ABNORMAL LOW (ref 3.5–5.0)
Alkaline Phosphatase: 43 U/L (ref 38–126)
Anion gap: 9 (ref 5–15)
BUN: 20 mg/dL (ref 6–20)
CO2: 24 mmol/L (ref 22–32)
Calcium: 9.2 mg/dL (ref 8.9–10.3)
Chloride: 107 mmol/L (ref 101–111)
Creatinine, Ser: 0.84 mg/dL (ref 0.61–1.24)
GFR calc Af Amer: >60 mL/min (ref >60)
GFR calc non Af Amer: >60 mL/min (ref >60)
Glucose, Bld: 105 mg/dL — ABNORMAL HIGH (ref 65–99)
Potassium: 3.9 mmol/L (ref 3.5–5.1)
Sodium: 140 mmol/L (ref 135–145)
Total Bilirubin: 0.7 mg/dL (ref 0.3–1.2)
Total Protein: 5.5 g/dL — ABNORMAL LOW (ref 6.5–8.1)

## 2017-12-11 LAB — CBC
HCT: 42.8 % (ref 39.0–52.0)
Hemoglobin: 14.4 g/dL (ref 13.0–17.0)
MCH: 31.8 pg (ref 26.0–34.0)
MCHC: 33.6 g/dL (ref 30.0–36.0)
MCV: 94.5 fL (ref 78.0–100.0)
Platelets: 116 10*3/uL — ABNORMAL LOW (ref 150–400)
RBC: 4.53 MIL/uL (ref 4.22–5.81)
RDW: 13.7 % (ref 11.5–15.5)
WBC: 5.7 10*3/uL (ref 4.0–10.5)

## 2017-12-11 LAB — TYPE AND SCREEN
ABO/RH(D): O POS
Antibody Screen: NEGATIVE

## 2017-12-11 LAB — PROTIME-INR
INR: 1.26
Prothrombin Time: 15.7 seconds — ABNORMAL HIGH (ref 11.4–15.2)

## 2017-12-11 LAB — BRAIN NATRIURETIC PEPTIDE: B Natriuretic Peptide: 287.1 pg/mL — ABNORMAL HIGH (ref 0.0–100.0)

## 2017-12-11 LAB — ABO/RH: ABO/RH(D): O POS

## 2017-12-11 LAB — SURGICAL PCR SCREEN
MRSA, PCR: NEGATIVE
Staphylococcus aureus: NEGATIVE

## 2017-12-11 MED ORDER — MAGNESIUM SULFATE 50 % IJ SOLN
40.0000 meq | INTRAMUSCULAR | Status: DC
  Filled 2017-12-11: qty 9.85

## 2017-12-11 MED ORDER — SODIUM CHLORIDE 0.9 % IV SOLN
INTRAVENOUS | Status: AC
  Administered 2017-12-12: 1.2 [IU]/h via INTRAVENOUS
  Filled 2017-12-11: qty 1

## 2017-12-11 MED ORDER — MILRINONE LACTATE IN DEXTROSE 20-5 MG/100ML-% IV SOLN
0.1250 ug/kg/min | INTRAVENOUS | Status: AC
  Administered 2017-12-12: 5 ug/kg/min via INTRAVENOUS
  Filled 2017-12-11: qty 100

## 2017-12-11 MED ORDER — DEXMEDETOMIDINE HCL IN NACL 400 MCG/100ML IV SOLN
0.1000 ug/kg/h | INTRAVENOUS | Status: AC
  Administered 2017-12-12: .3 ug/kg/h via INTRAVENOUS
  Filled 2017-12-11: qty 100

## 2017-12-11 MED ORDER — CHLORHEXIDINE GLUCONATE CLOTH 2 % EX PADS
6.0000 | MEDICATED_PAD | Freq: Once | CUTANEOUS | Status: AC
  Administered 2017-12-11: 6 via TOPICAL

## 2017-12-11 MED ORDER — GLUCERNA SHAKE PO LIQD
711.0000 mL | Freq: Four times a day (QID) | ORAL | Status: DC
  Administered 2017-12-11 (×3): 711 mL via ORAL

## 2017-12-11 MED ORDER — BISACODYL 5 MG PO TBEC
5.0000 mg | DELAYED_RELEASE_TABLET | Freq: Once | ORAL | Status: AC
  Administered 2017-12-11: 5 mg via ORAL
  Filled 2017-12-11: qty 1

## 2017-12-11 MED ORDER — DOPAMINE-DEXTROSE 3.2-5 MG/ML-% IV SOLN
0.0000 ug/kg/min | INTRAVENOUS | Status: AC
  Administered 2017-12-12: 3 ug/kg/min via INTRAVENOUS
  Filled 2017-12-11: qty 250

## 2017-12-11 MED ORDER — TRANEXAMIC ACID (OHS) PUMP PRIME SOLUTION
2.0000 mg/kg | INTRAVENOUS | Status: DC
  Filled 2017-12-11: qty 1.84

## 2017-12-11 MED ORDER — CHLORHEXIDINE GLUCONATE 0.12 % MT SOLN
15.0000 mL | Freq: Once | OROMUCOSAL | Status: AC
  Administered 2017-12-12: 15 mL via OROMUCOSAL
  Filled 2017-12-11: qty 15

## 2017-12-11 MED ORDER — POTASSIUM CHLORIDE 2 MEQ/ML IV SOLN
80.0000 meq | INTRAVENOUS | Status: DC
  Filled 2017-12-11: qty 40

## 2017-12-11 MED ORDER — SODIUM CHLORIDE 0.9 % IV SOLN
1.5000 g | INTRAVENOUS | Status: AC
  Administered 2017-12-12: 1.5 g via INTRAVENOUS
  Filled 2017-12-11: qty 1.5

## 2017-12-11 MED ORDER — SODIUM CHLORIDE 0.9 % IV SOLN
INTRAVENOUS | Status: DC
  Filled 2017-12-11: qty 30

## 2017-12-11 MED ORDER — NITROGLYCERIN IN D5W 200-5 MCG/ML-% IV SOLN
2.0000 ug/min | INTRAVENOUS | Status: AC
  Administered 2017-12-12: 16.6 ug/min via INTRAVENOUS
  Filled 2017-12-11: qty 250

## 2017-12-11 MED ORDER — METOPROLOL TARTRATE 12.5 MG HALF TABLET
12.5000 mg | ORAL_TABLET | Freq: Once | ORAL | Status: AC
  Administered 2017-12-12: 12.5 mg via ORAL
  Filled 2017-12-11: qty 1

## 2017-12-11 MED ORDER — EPINEPHRINE PF 1 MG/ML IJ SOLN
0.0000 ug/min | INTRAVENOUS | Status: DC
  Filled 2017-12-11: qty 4

## 2017-12-11 MED ORDER — SODIUM CHLORIDE 0.9 % IV SOLN
30.0000 ug/min | INTRAVENOUS | Status: AC
  Administered 2017-12-12: 25 ug/min via INTRAVENOUS
  Filled 2017-12-11: qty 2

## 2017-12-11 MED ORDER — VANCOMYCIN HCL 10 G IV SOLR
1500.0000 mg | INTRAVENOUS | Status: AC
  Administered 2017-12-12: 1500 mg via INTRAVENOUS
  Filled 2017-12-11: qty 1500

## 2017-12-11 MED ORDER — GLUCERNA SHAKE PO LIQD: 237.0000 mL | ORAL | Status: DC | PRN

## 2017-12-11 MED ORDER — SODIUM CHLORIDE 0.9 % IV SOLN
1.5000 mg/kg/h | INTRAVENOUS | Status: AC
  Administered 2017-12-12: 1.5 mg/kg/h via INTRAVENOUS
  Filled 2017-12-11: qty 25

## 2017-12-11 MED ORDER — CHLORHEXIDINE GLUCONATE CLOTH 2 % EX PADS
6.0000 | MEDICATED_PAD | Freq: Once | CUTANEOUS | Status: AC
  Administered 2017-12-12: 6 via TOPICAL

## 2017-12-11 MED ORDER — TRANEXAMIC ACID (OHS) BOLUS VIA INFUSION
15.0000 mg/kg | INTRAVENOUS | Status: AC
  Administered 2017-12-12: 1378.5 mg via INTRAVENOUS
  Filled 2017-12-11: qty 1379

## 2017-12-11 MED ORDER — PLASMA-LYTE 148 IV SOLN
INTRAVENOUS | Status: AC
  Administered 2017-12-12: 500 mL
  Filled 2017-12-11: qty 2.5

## 2017-12-11 MED ORDER — SODIUM CHLORIDE 0.9 % IV SOLN
750.0000 mg | INTRAVENOUS | Status: DC
  Filled 2017-12-11: qty 750

## 2017-12-11 MED FILL — Heparin Sod (Porcine)-NaCl IV Soln 1000 Unit/500ML-0.9%: INTRAVENOUS | Qty: 1000 | Status: AC

## 2017-12-11 NOTE — Progress Notes (Signed)
Pre CABG Dopplers completed. Bilateral 1% to 39% ICA stenosis with antegrade vertebral artery flow. Palmar arch is normal bilaterally. Bilateral ABIs are within normal limits with abnormal TBIs [place] 12/11/2017 2:06 PM

## 2017-12-11 NOTE — Progress Notes (Signed)
[place] for Heparin Indication: CAD, awaiting CABG  Allergies  Allergen Reactions  . Ace Inhibitors Swelling and Other (See Comments)    Possible angioedema (upper lip swelling)  . Morphine And Related Swelling    SWELLING REACTION UNSPECIFIED  [severity rated per PMH, 12/11/2017]    Patient Measurements: Height: 6\' 2"  (188 cm) Weight: 202 lb 11.2 oz (91.9 kg)(c scale) IBW/kg (Calculated) : 82.2 Heparin Dosing Weight: 92 kg  Vital Signs: Temp: 97.7 F (36.5 C) (04/23 1609) Temp Source: Oral (04/23 1609) BP: 128/84 (04/23 1609) Pulse Rate: 66 (04/23 1609)  Labs from 12/09/17:  Hgb 15.5, platelet count 147 (seems to run ~110s-150s)  Scr 0.78  Estimated Creatinine Clearance: 84.3 mL/min (by C-G formula based on SCr of 0.84 mg/dL).   Medical History: Past Medical History:  Diagnosis Date  . Anxiety   . Cardiomyopathy ([place])    a. 10/2017: echo showing reduced EF of 40-45%, HK of the anterior, anteroseptal and apical myocardium with Grade 1 DD.   Marland Kitchen Coronary artery disease 2003   a. nonobstructive CAD by cath in 2003 and 2005  . Diarrhea   . Hearing loss   . History of benign prostatic hypertrophy   . History of radiation therapy 01/31/10-03/22/10   right tonsil/right neck node 7000 cGy 35 sessions, high risk lymph node volume 5940 cGy 35 sessions, low risk lymph node vol 5600 cGy 35 sessions  . 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 HTN (hypertension)   . Hyperlipidemia    diet controlled- taking medication d/t ensure drinking  . Hypothyroid 01/16/2013  . Neck pain   . Sinus bradycardia   . Squamous cell carcinoma    right tonsil- 2011  . Thrombocytopenia ([place]) 02/02/2012   unclear etiology  . Tinnitus    Assessment:  77 yr old male s/p cardiac cath today, found to have significant 2v CAD.  TCTS consulted, CABG scheduled for 12/12/17.  Hx thrombocytopenia. Platelet count 116 today.  Seems to run 110s-150s. No s/s of bleeding observed.    Heparin level is therapeutic at 0.56 on drip rate 1500 uts/hr   Goal of Therapy:  Heparin level 0.3-0.7 units/ml Monitor platelets by anticoagulation protocol: Yes   Plan:  Continue heparin infusion at 1500 units/hr.  Daily heparin level and CBC while on heparin.  Bonnita Nasuti Pharm.D. CPP, BCPS Clinical Pharmacist [phone] 12/11/2017 7:46 PM

## 2017-12-11 NOTE — Progress Notes (Signed)
[place] for Heparin Indication: CAD, awaiting CABG  Allergies  Allergen Reactions  . Ace Inhibitors Swelling and Other (See Comments)    Possible angioedema (upper lip swelling)  . Morphine And Related Swelling    SWELLING REACTION UNSPECIFIED  [severity rated per PMH, 12/11/2017]    Patient Measurements: Height: 6\' 2"  (188 cm) Weight: 202 lb 11.2 oz (91.9 kg)(c scale) IBW/kg (Calculated) : 82.2 Heparin Dosing Weight: 92 kg  Vital Signs: Temp: 98.5 F (36.9 C) (04/23 0900) Temp Source: Oral (04/23 0900) BP: 123/66 (04/23 0900) Pulse Rate: 63 (04/23 0900)  Labs from 12/09/17:  Hgb 15.5, platelet count 147 (seems to run ~110s-150s)  Scr 0.78  Estimated Creatinine Clearance: 84.3 mL/min (by C-G formula based on SCr of 0.84 mg/dL).   Medical History: Past Medical History:  Diagnosis Date  . Anxiety   . Cardiomyopathy ([place])    a. 10/2017: echo showing reduced EF of 40-45%, HK of the anterior, anteroseptal and apical myocardium with Grade 1 DD.   Marland Kitchen Coronary artery disease 2003   a. nonobstructive CAD by cath in 2003 and 2005  . Diarrhea   . Hearing loss   . History of benign prostatic hypertrophy   . History of radiation therapy 01/31/10-03/22/10   right tonsil/right neck node 7000 cGy 35 sessions, high risk lymph node volume 5940 cGy 35 sessions, low risk lymph node vol 5600 cGy 35 sessions  . 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 HTN (hypertension)   . Hyperlipidemia    diet controlled- taking medication d/t ensure drinking  . Hypothyroid 01/16/2013  . Neck pain   . Sinus bradycardia   . Squamous cell carcinoma    right tonsil- 2011  . Thrombocytopenia ([place]) 02/02/2012   unclear etiology  . Tinnitus    Assessment:  77 yr old male s/p cardiac cath today, found to have significant 2v CAD.  TCTS consulted, CABG scheduled for 12/12/17.  Hx thrombocytopenia. Platelet count 116 today.  Seems to run 110s-150s. No s/s of bleeding observed.    Heparin level this am is therapeutic at 0.45   Goal of Therapy:  Heparin level 0.3-0.7 units/ml Monitor platelets by anticoagulation protocol: Yes   Plan:  Continue heparin infusion at 1500 units/hr.  Confirmatory level at 1800 today  Daily heparin level and CBC while on heparin.  Albertina Parr, PharmD., BCPS Clinical Pharmacist Clinical phone for 12/11/17 until 3:30pm: [phone] If after 3:30pm, please call main pharmacy at: [phone]

## 2017-12-11 NOTE — Progress Notes (Signed)
[address]       [place] [address]             [phone]                 1 Day Post-Op Procedure(s) (LRB): LEFT HEART CATH AND CORONARY ANGIOGRAPHY (N/A)  LOS: 1 day   Subjective: Patient without chest pain or shortness of breath this morning,   Objective: Vital signs in last 24 hours: Patient Vitals for the past 24 hrs:  BP Temp Temp src Pulse Resp SpO2 Height Weight  12/11/17 0558 - - - - - - - 202 lb 11.2 oz (91.9 kg)  12/11/17 0556 130/72 97.7 F (36.5 C) Oral (!) 56 - 97 % - -  12/11/17 0044 120/69 97.9 F (36.6 C) Oral (!) 51 - 95 % - -  12/10/17 1951 133/71 97.7 F (36.5 C) Oral (!) 48 17 94 % - -  12/10/17 1629 (!) 178/95 - - - - - - -  12/10/17 1600 (!) 161/77 - - - - - - -  12/10/17 1547 (!) 158/96 - - - - - 6\' 2"  (1.88 m) 204 lb 1.6 oz (92.6 kg)  12/10/17 1531 114/89 - - - - - - -  12/10/17 1516 (!) 152/79 - - - - - - -  12/10/17 1445 (!) 128/112 - - 69 19 95 % - -  12/10/17 1415 (!) 170/71 - - 64 (!) 28 99 % - -  12/10/17 1345 128/64 - - (!) 56 (!) 23 95 % - -  12/10/17 1315 126/63 - - (!) 52 16 94 % - -  12/10/17 1300 122/78 - - 61 19 96 % - -  12/10/17 1245 134/63 - - (!) 59 20 96 % - -  12/10/17 1230 132/89 - - (!) 59 19 98 % - -  12/10/17 1220 107/73 - - (!) 57 (!) 23 96 % - -  12/10/17 1215 (!) 148/69 - - (!) 57 (!) 21 94 % - -  12/10/17 1210 (!) 154/76 - - (!) 58 19 94 % - -  12/10/17 1200 (!) 147/84 - - (!) 55 (!) 22 94 % - -  12/10/17 1155 (!) 151/72 - - (!) 53 (!) 22 96 % - -  12/10/17 1150 (!) 144/81 - - (!) 55 18 97 % - -  12/10/17 1145 (!) 155/72 - - (!) 59 (!) 21 94 % - -  12/10/17 1140 (!) 149/72 - - (!) 56 13 96 % - -  12/10/17 1135 (!) 160/82 - - (!) 59 20 95 % - -  12/10/17 1130 (!) 161/80 - - (!) 49 11 95 % - -  12/10/17 1125 (!) 143/72 - - (!) 56 16 94 % - -  12/10/17 1120 (!) 151/73 - - 60 (!) 21 93 % - -  12/10/17 1115 (!) 142/74 - - (!) 59 19 94 % - -  12/10/17 1110 (!) 146/106 - - 67 (!) 21 94 % - -    12/10/17 1105 (!) 163/81 - - 65 17 95 % - -  12/10/17 1101 - - - (!) 0 (!) 0 (!) 0 % - -  12/10/17 1056 140/83 - - 68 10 (!) 0 % - -  12/10/17 1051 (!) 148/89 - - 70 (!) 9 97 % - -  12/10/17 1046 139/82 - - 66 (!) 25 97 % - -  12/10/17 1041 (!) 166/104 - -  76 10 100 % - -  12/10/17 1036 (!) 167/102 - - 84 12 93 % - -  12/10/17 1031 (!) 151/90 - - 72 (!) 7 98 % - -  12/10/17 1026 134/77 - - 63 16 95 % - -  12/10/17 1021 137/78 - - 62 (!) 31 95 % - -  12/10/17 1016 (!) 156/91 - - 69 17 99 % - -  12/10/17 1016 - - - - - 100 % - -  12/10/17 1014 - - - 66 - - - -    Filed Weights   12/10/17 0750 12/10/17 1547 12/11/17 0558  Weight: 208 lb (94.3 kg) 204 lb 1.6 oz (92.6 kg) 202 lb 11.2 oz (91.9 kg)    Hemodynamic parameters for last 24 hours:    Intake/Output from previous day: 04/22 0701 - 04/23 0700 In: 600.5 [P.O.:477; I.V.:123.5] Out: 450 [Urine:450] Intake/Output this shift: No intake/output data recorded.  Scheduled Meds: . aspirin EC  81 mg Oral Daily  . carvedilol  3.125 mg Oral BID WC  . docusate sodium  300 mg Oral Daily  . furosemide  20 mg Intravenous Daily  . [START ON 12/12/2017] heparin-papaverine-plasmalyte irrigation   Irrigation To OR  . isosorbide mononitrate  30 mg Oral Daily  . [START ON 12/12/2017] magnesium sulfate  40 mEq Other To OR  . Melatonin  9 mg Oral QHS  . mirabegron ER  25 mg Oral Daily  . multivitamin with minerals  1 tablet Oral Daily  . [START ON 12/12/2017] potassium chloride  80 mEq Other To OR  . rosuvastatin  20 mg Oral q1800  . sodium chloride flush  3 mL Intravenous Q12H  . [START ON 12/12/2017] tranexamic acid  15 mg/kg Intravenous To OR  . [START ON 12/12/2017] tranexamic acid  2 mg/kg Intracatheter To OR   Continuous Infusions: . sodium chloride    . [START ON 12/12/2017] cefUROXime (ZINACEF)  IV    . [START ON 12/12/2017] cefUROXime (ZINACEF)  IV    . [START ON 12/12/2017] dexmedetomidine    . [START ON 12/12/2017] DOPamine    .  [START ON 12/12/2017] epinephrine    . [START ON 12/12/2017] heparin 30,000 units/NS 1000 mL solution for CELLSAVER    . heparin 1,500 Units/hr (12/11/17 0452)  . [START ON 12/12/2017] insulin (NOVOLIN-R) infusion    . [START ON 12/12/2017] milrinone    . [START ON 12/12/2017] nitroGLYCERIN    . [START ON 12/12/2017] phenylephrine 20mg /227mL NS (0.08mg /ml) infusion    . [START ON 12/12/2017] tranexamic acid (CYKLOKAPRON) infusion ([facility])    . [START ON 12/12/2017] vancomycin     PRN Meds:.sodium chloride, acetaminophen, nitroGLYCERIN, ondansetron (ZOFRAN) IV, sodium chloride flush, zolpidem  General appearance: alert and cooperative Neurologic: intact Heart: regular rate and rhythm, S1, S2 normal, no murmur, click, rub or gallop Lungs: clear to auscultation bilaterally Abdomen: soft, non-tender; bowel sounds normal; no masses,  no organomegaly  Lab Results: CBC: Recent Labs    12/11/17 0233  WBC 5.7  HGB 14.4  HCT 42.8  PLT 116*   BMET:  Recent Labs    12/11/17 0233  NA 140  K 3.9  CL 107  CO2 24  GLUCOSE 105*  BUN 20  CREATININE 0.84  CALCIUM 9.2    PT/INR:  Recent Labs    12/11/17 0233  LABPROT 15.7*  INR 1.26     Radiology Dg Chest 2 View  Result Date: 12/11/2017 CLINICAL DATA:  Preop for cardiac surgery, history  of cardiomyopathy and hypertension EXAM: CHEST - 2 VIEW COMPARISON:  Chest x-ray of 08/07/2013 FINDINGS: No active infiltrate or effusion is seen. Mediastinal and hilar contours are unremarkable. The heart is within normal limits in size for age. No acute bony abnormality is seen. IMPRESSION: No active cardiopulmonary disease. Electronically Signed   By: Ivar Drape M.D.   On: 12/11/2017 08:56     Assessment/Plan: S/P Procedure(s) (LRB): LEFT HEART CATH AND CORONARY ANGIOGRAPHY (N/A) Mobilize Plan coronary artery bypass grafting tomorrow The goals risks and alternatives of the planned surgical procedure Procedure(s): LEFT HEART CATH AND CORONARY  ANGIOGRAPHY (N/A)  have been discussed with the patient in detail. The risks of the procedure including death, infection, stroke, myocardial infarction, bleeding, blood transfusion have all been discussed specifically.  I have quoted Phillip [name] a 3 % of perioperative mortality and a complication rate as high as 40 %. [name] questions have been answered.Phillip [name] is willing  to proceed with the planned procedure.  She probably has letter that she wears Phillip [name] yes his way out notes he is got like 6 of the from March sending the text that matter there is calling for this telemetry when he gets back he does get of the left and all they did not based on his recent medical records: This is a sometimes one time they said that in the he found out they really have been done but on the wrong number or some of some happen  Phillip Isaac MD 12/11/2017 9:21 AM     Patient ID: Phillip [name], male   DOB: 16-Nov-1940, 77 y.o.   MRN: [id number]

## 2017-12-11 NOTE — Progress Notes (Signed)
Initial Nutrition Assessment  DOCUMENTATION CODES:   Not applicable  INTERVENTION:   -Downgrade diet to dysphagia 3 (mechanical soft); pt consumes a mechanical soft diet for comfort PTA -3 cans (711 ml) of Glucerna QID, regimen provides 2640 kcals and 120 grams protein -Glucerna PRN, so pt can request additional supplements as desired -Continue MVI daily  NUTRITION DIAGNOSIS:   Increased nutrient needs related to chronic illness as evidenced by estimated needs.  GOAL:   Patient will meet greater than or equal to 90% of their needs  MONITOR:   PO intake, Supplement acceptance, Labs, TF tolerance, Skin  REASON FOR ASSESSMENT:   Consult Assessment of nutrition requirement/status  ASSESSMENT:   Phillip [name] is a 77 y.o. male with past medical history of CAD (nonobstructive CAD by cath in 2003 and 2005), HTN, HLD, and tonsillar squamous cell cancer (s/p XRT) who was admitted for atypical chest pain and abnormal stress test.   Pt admitted with unstable angina and ischemic cardiomyopathy.   4/22- s/p lt heart cath with coronary angiography  Plan for CABG tomorrow (12/12/17).   Case discussed with RN, executive chef, and assistant nursing director prior to visit. Pt has specific nutritional needs and RD has been requested for assessment. RD also left a message with patient services manager this morning at [phone] to help assist with pt needs.   Spoke with pt at bedside, who provided an extensive nutritional hx. He reports that he underwent treatment with tonsillar cancer several years ago and was PEG dependent; PEG was removed and pt is now mainly dependent on supplements for the majority of his nutritional intake. He has been ordering 10 cases of Ensure Plus per month over the past several year, but has recently switched to Glucerna due to concern for high CBGS. Typical intake for pt is 3 Glucerna supplements QID (0800, 1200, 1600, 2000). Pt also consumes soft-textured foods for  comfort, such as scrambled eggs, sausage, soup, salad, hot dog with chili, and hamburger steak with gravy. Pt was upset regarding his breakfast tray this morning, as he received many items that he was unable to tolerate. Pt also with difficulty chewing given side effects from radiation. Pt was amenable to downgrade to dysphagia 3 (mechanical soft) diet to better meet his needs.   Obtained pt food preferences; pt reports he may occasionally order a tray, however, will likely mainly consume supplements during hospital stay. Confirmed orders and requests for supplements and also added a PRN order for Glucerna so that pt could request additional supplements if he desires.   Wt has been stable over the past year; UBW around 205-210#.   Labs reviewed.   NUTRITION - FOCUSED PHYSICAL EXAM:    Most Recent Value  Orbital Region  No depletion  Upper Arm Region  No depletion  Thoracic and Lumbar Region  No depletion  Buccal Region  No depletion  Temple Region  No depletion  Clavicle Bone Region  No depletion  Clavicle and Acromion Bone Region  No depletion  Dorsal Hand  No depletion  Patellar Region  No depletion  Anterior Thigh Region  No depletion  Posterior Calf Region  No depletion  Edema (RD Assessment)  None  Hair  Reviewed  Eyes  Reviewed  Mouth  Reviewed  Skin  Reviewed  Nails  Reviewed       Diet Order:  DIET DYS 3 Room service appropriate? Yes; Fluid consistency: Thin  EDUCATION NEEDS:   Education needs have been addressed  Skin:  Skin  Assessment: Reviewed RN Assessment  Last BM:  12/11/17  Height:   Ht Readings from Last 1 Encounters:  12/10/17 6\' 2"  (1.88 m)    Weight:   Wt Readings from Last 1 Encounters:  12/11/17 202 lb 11.2 oz (91.9 kg)    Ideal Body Weight:  86.4 kg  BMI:  Body mass index is 26.03 kg/m.  Estimated Nutritional Needs:   Kcal:  2200-2400  Protein:  120-135 grams  Fluid:  >2.2 L    Yasmyn Bellisario A. Jimmye Norman, RD, LDN, CDE Pager:  [phone] After hours Pager: [phone]

## 2017-12-11 NOTE — Progress Notes (Signed)
[place] for Heparin Indication: CAD, awaiting CABG  Allergies  Allergen Reactions  . Ace Inhibitors Swelling and Other (See Comments)    Possible angioedema (upper lip swelling)  . Morphine And Related Swelling    Patient Measurements: Height: 6\' 2"  (188 cm) Weight: 204 lb 1.6 oz (92.6 kg) IBW/kg (Calculated) : 82.2 Heparin Dosing Weight: 92 kg  Vital Signs: Temp: 97.9 F (36.6 C) (04/23 0044) Temp Source: Oral (04/23 0044) BP: 120/69 (04/23 0044) Pulse Rate: 51 (04/23 0044)  Labs from 12/09/17:  Hgb 15.5, platelet count 147 (seems to run ~110s-150s)  Scr 0.78  Estimated Creatinine Clearance: 84.3 mL/min (by C-G formula based on SCr of 0.84 mg/dL).   Medical History: Past Medical History:  Diagnosis Date  . Anxiety   . Cardiomyopathy ([place])    a. 10/2017: echo showing reduced EF of 40-45%, HK of the anterior, anteroseptal and apical myocardium with Grade 1 DD.   Marland Kitchen Coronary artery disease 2003   a. nonobstructive CAD by cath in 2003 and 2005  . Diarrhea   . Hearing loss   . History of benign prostatic hypertrophy   . History of radiation therapy 01/31/10-03/22/10   right tonsil/right neck node 7000 cGy 35 sessions, high risk lymph node volume 5940 cGy 35 sessions, low risk lymph node vol 5600 cGy 35 sessions  . 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 HTN (hypertension)   . Hyperlipidemia    diet controlled- taking medication d/t ensure drinking  . Hypothyroid 01/16/2013  . Neck pain   . Sinus bradycardia   . Squamous cell carcinoma    right tonsil- 2011  . Thrombocytopenia ([employer]) 02/02/2012   unclear etiology  . Tinnitus    Assessment:  77 yr old male s/p cardiac cath today, found to have significant 2v CAD.  TCTS consulted, CABG scheduled for 12/12/17.  Hx thrombocytopenia. Platelet count 145 on 12/09/17.  Seems to run 110s-150s.  Heparin level this am 0.17 units/hr  Goal of Therapy:  Heparin level 0.3-0.7 units/ml Monitor platelets by  anticoagulation protocol: Yes   Plan:   Heparin infusion 1500 units/hr.   Heparin level 6 hours after rate change  Daily heparin level and CBC while on heparin.  Beverlee Nims, PharmD  12/11/2017,4:44 AM

## 2017-12-11 NOTE — Progress Notes (Signed)
Discussed sternal precautions, IS (Financial controller ordered), mobility post op and d/c planning. Good reception. Gave materials to review. Pt needs a specific, moist diet. To discuss with management.  [id number] Yves Dill CES, ACSM 11:07 AM 12/11/2017

## 2017-12-11 NOTE — Progress Notes (Signed)
Progress Note  Patient Name: Phillip [name] Date of Encounter: 12/11/2017  Primary Cardiologist: [name], MD  Subjective   Feeling well, no complaints.   Inpatient Medications    Scheduled Meds: . aspirin EC  81 mg Oral Daily  . carvedilol  3.125 mg Oral BID WC  . docusate sodium  300 mg Oral Daily  . furosemide  20 mg Intravenous Daily  . [START ON 12/12/2017] heparin-papaverine-plasmalyte irrigation   Irrigation To OR  . isosorbide mononitrate  30 mg Oral Daily  . [START ON 12/12/2017] magnesium sulfate  40 mEq Other To OR  . Melatonin  9 mg Oral QHS  . mirabegron ER  25 mg Oral Daily  . multivitamin with minerals  1 tablet Oral Daily  . [START ON 12/12/2017] potassium chloride  80 mEq Other To OR  . rosuvastatin  20 mg Oral q1800  . sodium chloride flush  3 mL Intravenous Q12H  . [START ON 12/12/2017] tranexamic acid  15 mg/kg Intravenous To OR  . [START ON 12/12/2017] tranexamic acid  2 mg/kg Intracatheter To OR   Continuous Infusions: . sodium chloride    . [START ON 12/12/2017] cefUROXime (ZINACEF)  IV    . [START ON 12/12/2017] cefUROXime (ZINACEF)  IV    . [START ON 12/12/2017] dexmedetomidine    . [START ON 12/12/2017] DOPamine    . [START ON 12/12/2017] epinephrine    . [START ON 12/12/2017] heparin 30,000 units/NS 1000 mL solution for CELLSAVER    . heparin 1,500 Units/hr (12/11/17 0452)  . [START ON 12/12/2017] insulin (NOVOLIN-R) infusion    . [START ON 12/12/2017] milrinone    . [START ON 12/12/2017] nitroGLYCERIN    . [START ON 12/12/2017] phenylephrine 20mg /220mL NS (0.08mg /ml) infusion    . [START ON 12/12/2017] tranexamic acid (CYKLOKAPRON) infusion ([facility])    . [START ON 12/12/2017] vancomycin     PRN Meds: sodium chloride, acetaminophen, nitroGLYCERIN, ondansetron (ZOFRAN) IV, sodium chloride flush, zolpidem   Vital Signs    Vitals:   12/11/17 0044 12/11/17 0556 12/11/17 0558 12/11/17 0900  BP: 120/69 130/72  123/66  Pulse: (!) 51 (!) 56  63  Resp:     16  Temp: 97.9 F (36.6 C) 97.7 F (36.5 C)  98.5 F (36.9 C)  TempSrc: Oral Oral  Oral  SpO2: 95% 97%  94%  Weight:   202 lb 11.2 oz (91.9 kg)   Height:        Intake/Output Summary (Last 24 hours) at 12/11/2017 1044 Last data filed at 12/11/2017 1021 Gross per 24 hour  Intake 1080.47 ml  Output 676 ml  Net 404.47 ml   Filed Weights   12/10/17 0750 12/10/17 1547 12/11/17 0558  Weight: 208 lb (94.3 kg) 204 lb 1.6 oz (92.6 kg) 202 lb 11.2 oz (91.9 kg)    Telemetry    SR with PVCs - Personally Reviewed  ECG    SR - Personally Reviewed  Physical Exam   General: Well developed, well nourished, older W male appearing in no acute distress. Head: Normocephalic, atraumatic.  Neck: Supple without bruits, JVD. Lungs:  Resp regular and unlabored, CTA. Heart: RRR, S1, S2, no S3, S4, or murmur; no rub. Abdomen: Soft, non-tender, non-distended with normoactive bowel sounds. Extremities: No clubbing, cyanosis, edema. Distal pedal pulses are 2+ bilaterally. Right radial cath site stable. Neuro: Alert and oriented X 3. Moves all extremities spontaneously. Psych: Normal affect.  Labs    Chemistry Recent Labs  Lab 12/11/17 [phone]  NA 140  K 3.9  CL 107  CO2 24  GLUCOSE 105*  BUN 20  CREATININE 0.84  CALCIUM 9.2  PROT 5.5*  ALBUMIN 3.4*  AST 19  ALT 18  ALKPHOS 43  BILITOT 0.7  GFRNONAA >60  GFRAA >60  ANIONGAP 9     Hematology Recent Labs  Lab 12/11/17 0233  WBC 5.7  RBC 4.53  HGB 14.4  HCT 42.8  MCV 94.5  MCH 31.8  MCHC 33.6  RDW 13.7  PLT 116*    Cardiac EnzymesNo results for input(s): TROPONINI in the last 168 hours. No results for input(s): TROPIPOC in the last 168 hours.   BNP Recent Labs  Lab 12/11/17 0233  BNP 287.1*     DDimer No results for input(s): DDIMER in the last 168 hours.    Radiology    Dg Chest 2 View  Result Date: 12/11/2017 CLINICAL DATA:  Preop for cardiac surgery, history of cardiomyopathy and hypertension EXAM: CHEST  - 2 VIEW COMPARISON:  Chest x-ray of 08/07/2013 FINDINGS: No active infiltrate or effusion is seen. Mediastinal and hilar contours are unremarkable. The heart is within normal limits in size for age. No acute bony abnormality is seen. IMPRESSION: No active cardiopulmonary disease. Electronically Signed   By: Ivar Drape M.D.   On: 12/11/2017 08:56    Cardiac Studies   Cath: 12/10/17  Conclusion   Conclusions: 1. Significant 2-vessel coronary artery disease with chronic total occlusion of proximal LAD and 80-90% proximal codominant LCx stenosis.  The LMCA as well as the proximal and mid LAD/LCx are heavily calcified.  The mid/distal LAD fill via left-to-left and right-to-left collaterals. 2. Moderately elevated left ventricular filling pressure.  LVEF known to be moderately reduced by recent echo.  Recommendations: 1. Given recent chest pain at rest and severe proximal LAD and LCx disease (LMCA-equivalent), I will admit Phillip [name] for medical optimization and cardiac surgery consultation for CABG. 2. Initiate heparin infusion in 8 hours. 3. Hold amlodipine, given low LVEF, and initiate isosorbide mononitrate 30 mg daily. 4. Start carvedilol 3.125 mg BID.  Defer adding ACEI/ARB given history of angioedema with lisinopril. 5. Gentle diuresis. Furosemide 20 mg IV daily ordered.  [name], MD [facility]   Patient Profile     77 y.o. male past medical history of CAD (nonobstructive CAD by cath in 2003 and 2005), HTN, HLD, and tonsillar squamous cell cancer (s/p XRT) who presented to the office for follow up and reported symptoms concerning for angina. Sent for outpatient cath and found to have multivessel disease.   Assessment & Plan    1. CAD: Underwent cath with Dr. Saunders Revel noted above with severe pLAD and Lcx disease. He was continued on IV heparin and admitted for TCTS consult. Seen by Dr. Servando Snare with plans for CABG tomorrow. Morning labs are stable.  -- continue ASA, statin,  BB, Imdur  2. HL: on statin, last LDL (3/19) 65  3. HTN: stable with the addition on coreg. Amlodipine has been held given decline in EF.   Signed, Phillip Bellis, NP  12/11/2017, 10:44 AM  Pager # [phone]   For questions or updates, please contact [place] Please consult [email or web address] for contact info under Cardiology/STEMI.

## 2017-12-11 NOTE — Progress Notes (Signed)
Patient issued an incentive spirometry and instructions on use. Patient provided return demonstration and reached goal of 2500.

## 2017-12-11 NOTE — Anesthesia Preprocedure Evaluation (Addendum)
Anesthesia Evaluation  Patient identified by MRN, date of birth, ID band Patient awake    Reviewed: Allergy & Precautions, NPO status , Patient's Chart, lab work & pertinent test results  Airway Mallampati: II  TM Distance: >3 FB Neck ROM: Full    Dental no notable dental hx.    Pulmonary neg pulmonary ROS,    Pulmonary exam normal breath sounds clear to auscultation       Cardiovascular hypertension, + angina + CAD and + Peripheral Vascular Disease  Normal cardiovascular exam+ Valvular Problems/Murmurs  Rhythm:Regular Rate:Normal     Neuro/Psych PSYCHIATRIC DISORDERS Anxiety negative neurological ROS  negative psych ROS   GI/Hepatic negative GI ROS, Neg liver ROS,   Endo/Other  negative endocrine ROSHypothyroidism   Renal/GU negative Renal ROS     Musculoskeletal negative musculoskeletal ROS (+)   Abdominal   Peds negative pediatric ROS (+)  Hematology negative hematology ROS (+)   Anesthesia Other Findings   Reproductive/Obstetrics                            Anesthesia Physical Anesthesia Plan  ASA: IV  Anesthesia Plan: General   Post-op Pain Management:    Induction: Intravenous  PONV Risk Score and Plan: 3 and Treatment may vary due to age or medical condition  Airway Management Planned: Oral ETT  Additional Equipment: Arterial line, CVP, PA Cath, TEE and Ultrasound Guidance Line Placement  Intra-op Plan:   Post-operative Plan: Post-operative intubation/ventilation  Informed Consent: I have reviewed the patients History and Physical, chart, labs and discussed the procedure including the risks, benefits and alternatives for the proposed anesthesia with the patient or authorized representative who has indicated his/her understanding and acceptance.   Dental advisory given  Plan Discussed with: CRNA  Anesthesia Plan Comments:         Anesthesia Quick Evaluation

## 2017-12-12 ENCOUNTER — Inpatient Hospital Stay (HOSPITAL_COMMUNITY): Payer: PPO | Admitting: Anesthesiology

## 2017-12-12 ENCOUNTER — Encounter (HOSPITAL_COMMUNITY)
Admission: RE | Disposition: A | Discharge status: Home / Self Care | Payer: Self-pay | Source: Ambulatory Visit | Attending: Cardiothoracic Surgery

## 2017-12-12 ENCOUNTER — Inpatient Hospital Stay (HOSPITAL_COMMUNITY): Payer: PPO

## 2017-12-12 ENCOUNTER — Encounter (HOSPITAL_COMMUNITY): Payer: Self-pay | Admitting: Certified Registered"

## 2017-12-12 DIAGNOSIS — Z951 Presence of aortocoronary bypass graft: Secondary | ICD-10-CM

## 2017-12-12 DIAGNOSIS — R0789 Other chest pain: Secondary | ICD-10-CM

## 2017-12-12 HISTORY — PX: CORONARY ARTERY BYPASS GRAFT: SHX141

## 2017-12-12 HISTORY — PX: TEE WITHOUT CARDIOVERSION: SHX5443

## 2017-12-12 LAB — POCT I-STAT 4, (NA,K, GLUC, HGB,HCT)
Glucose, Bld: 157 mg/dL — ABNORMAL HIGH (ref 65–99)
HEMATOCRIT: 43 % (ref 39.0–52.0)
HEMOGLOBIN: 14.6 g/dL (ref 13.0–17.0)
Potassium: 3.9 mmol/L (ref 3.5–5.1)
SODIUM: 142 mmol/L (ref 135–145)

## 2017-12-12 LAB — POCT I-STAT 3, ART BLOOD GAS (G3+)
Acid-base deficit: 6 mmol/L — ABNORMAL HIGH (ref 0.0–2.0)
Acid-base deficit: 2 mmol/L (ref 0.0–2.0)
Acid-base deficit: 4 mmol/L — ABNORMAL HIGH (ref 0.0–2.0)
BICARBONATE: 26.2 mmol/L (ref 20.0–28.0)
BICARBONATE: 19.3 mmol/L — AB (ref 20.0–28.0)
Bicarbonate: 26.1 mmol/L (ref 20.0–28.0)
Bicarbonate: 23 mmol/L (ref 20.0–28.0)
Bicarbonate: 21.1 mmol/L (ref 20.0–28.0)
O2 SAT: 100 %
O2 SAT: 95 %
O2 Saturation: 100 %
O2 Saturation: 98 %
O2 Saturation: 98 %
PCO2 ART: 49.8 mmHg — AB (ref 32.0–48.0)
PCO2 ART: 40.7 mmHg (ref 32.0–48.0)
PCO2 ART: 36.7 mmHg (ref 32.0–48.0)
PH ART: 7.365 (ref 7.350–7.450)
PO2 ART: 189 mmHg — AB (ref 83.0–108.0)
PO2 ART: 76 mmHg — AB (ref 83.0–108.0)
Patient temperature: 36.8
TCO2: 28 mmol/L (ref 22–32)
TCO2: 28 mmol/L (ref 22–32)
TCO2: 20 mmol/L — AB (ref 22–32)
TCO2: 24 mmol/L (ref 22–32)
TCO2: 22 mmol/L (ref 22–32)
pCO2 arterial: 50 mmHg — ABNORMAL HIGH (ref 32.0–48.0)
pCO2 arterial: 35.5 mmHg (ref 32.0–48.0)
pH, Arterial: 7.329 — ABNORMAL LOW (ref 7.350–7.450)
pH, Arterial: 7.325 — ABNORMAL LOW (ref 7.350–7.450)
pH, Arterial: 7.342 — ABNORMAL LOW (ref 7.350–7.450)
pH, Arterial: 7.36 (ref 7.350–7.450)
pO2, Arterial: 404 mmHg — ABNORMAL HIGH (ref 83.0–108.0)
pO2, Arterial: 104 mmHg (ref 83.0–108.0)
pO2, Arterial: 106 mmHg (ref 83.0–108.0)

## 2017-12-12 LAB — CBC
HCT: 42.5 % (ref 39.0–52.0)
HEMATOCRIT: 42.4 % (ref 39.0–52.0)
HEMATOCRIT: 44.1 % (ref 39.0–52.0)
HEMOGLOBIN: 14.4 g/dL (ref 13.0–17.0)
Hemoglobin: 15.4 g/dL (ref 13.0–17.0)
Hemoglobin: 14.4 g/dL (ref 13.0–17.0)
MCH: 31.9 pg (ref 26.0–34.0)
MCH: 32.6 pg (ref 26.0–34.0)
MCH: 31.5 pg (ref 26.0–34.0)
MCHC: 34 g/dL (ref 30.0–36.0)
MCHC: 34.9 g/dL (ref 30.0–36.0)
MCHC: 33.9 g/dL (ref 30.0–36.0)
MCV: 93.8 fL (ref 78.0–100.0)
MCV: 93.2 fL (ref 78.0–100.0)
MCV: 93 fL (ref 78.0–100.0)
Platelets: 115 10*3/uL — ABNORMAL LOW (ref 150–400)
Platelets: 98 10*3/uL — ABNORMAL LOW (ref 150–400)
Platelets: 97 10*3/uL — ABNORMAL LOW (ref 150–400)
RBC: 4.52 MIL/uL (ref 4.22–5.81)
RBC: 4.73 MIL/uL (ref 4.22–5.81)
RBC: 4.57 MIL/uL (ref 4.22–5.81)
RDW: 13.4 % (ref 11.5–15.5)
RDW: 13.6 % (ref 11.5–15.5)
RDW: 13.3 % (ref 11.5–15.5)
WBC: 6 10*3/uL (ref 4.0–10.5)
WBC: 14.1 10*3/uL — ABNORMAL HIGH (ref 4.0–10.5)
WBC: 15.9 10*3/uL — ABNORMAL HIGH (ref 4.0–10.5)

## 2017-12-12 LAB — POCT I-STAT, CHEM 8
BUN: 20 mg/dL (ref 6–20)
BUN: 18 mg/dL (ref 6–20)
BUN: 19 mg/dL (ref 6–20)
BUN: 17 mg/dL (ref 6–20)
CALCIUM ION: 1.15 mmol/L (ref 1.15–1.40)
CHLORIDE: 105 mmol/L (ref 101–111)
CHLORIDE: 107 mmol/L (ref 101–111)
CREATININE: 0.6 mg/dL — AB (ref 0.61–1.24)
CREATININE: 0.7 mg/dL (ref 0.61–1.24)
Calcium, Ion: 1.3 mmol/L (ref 1.15–1.40)
Calcium, Ion: 1.27 mmol/L (ref 1.15–1.40)
Calcium, Ion: 1.21 mmol/L (ref 1.15–1.40)
Chloride: 104 mmol/L (ref 101–111)
Chloride: 105 mmol/L (ref 101–111)
Creatinine, Ser: 0.7 mg/dL (ref 0.61–1.24)
Creatinine, Ser: 0.6 mg/dL — ABNORMAL LOW (ref 0.61–1.24)
GLUCOSE: 123 mg/dL — AB (ref 65–99)
Glucose, Bld: 122 mg/dL — ABNORMAL HIGH (ref 65–99)
Glucose, Bld: 115 mg/dL — ABNORMAL HIGH (ref 65–99)
Glucose, Bld: 140 mg/dL — ABNORMAL HIGH (ref 65–99)
HCT: 41 % (ref 39.0–52.0)
HEMATOCRIT: 33 % — AB (ref 39.0–52.0)
HEMATOCRIT: 36 % — AB (ref 39.0–52.0)
HEMATOCRIT: 41 % (ref 39.0–52.0)
HEMOGLOBIN: 11.2 g/dL — AB (ref 13.0–17.0)
HEMOGLOBIN: 13.9 g/dL (ref 13.0–17.0)
Hemoglobin: 13.9 g/dL (ref 13.0–17.0)
Hemoglobin: 12.2 g/dL — ABNORMAL LOW (ref 13.0–17.0)
POTASSIUM: 4 mmol/L (ref 3.5–5.1)
POTASSIUM: 4.2 mmol/L (ref 3.5–5.1)
POTASSIUM: 3.8 mmol/L (ref 3.5–5.1)
Potassium: 4.4 mmol/L (ref 3.5–5.1)
SODIUM: 142 mmol/L (ref 135–145)
SODIUM: 142 mmol/L (ref 135–145)
SODIUM: 141 mmol/L (ref 135–145)
Sodium: 141 mmol/L (ref 135–145)
TCO2: 27 mmol/L (ref 22–32)
TCO2: 25 mmol/L (ref 22–32)
TCO2: 27 mmol/L (ref 22–32)
TCO2: 24 mmol/L (ref 22–32)

## 2017-12-12 LAB — BASIC METABOLIC PANEL
Anion gap: 11 (ref 5–15)
BUN: 21 mg/dL — ABNORMAL HIGH (ref 6–20)
CO2: 20 mmol/L — ABNORMAL LOW (ref 22–32)
Calcium: 9.1 mg/dL (ref 8.9–10.3)
Chloride: 109 mmol/L (ref 101–111)
Creatinine, Ser: 0.81 mg/dL (ref 0.61–1.24)
GFR calc Af Amer: >60 mL/min (ref >60)
GFR calc non Af Amer: >60 mL/min (ref >60)
Glucose, Bld: 114 mg/dL — ABNORMAL HIGH (ref 65–99)
Potassium: 4.3 mmol/L (ref 3.5–5.1)
Sodium: 140 mmol/L (ref 135–145)

## 2017-12-12 LAB — GLUCOSE, CAPILLARY
GLUCOSE-CAPILLARY: 125 mg/dL — AB (ref 65–99)
GLUCOSE-CAPILLARY: 129 mg/dL — AB (ref 65–99)
GLUCOSE-CAPILLARY: 134 mg/dL — AB (ref 65–99)
GLUCOSE-CAPILLARY: 151 mg/dL — AB (ref 65–99)
Glucose-Capillary: 138 mg/dL — ABNORMAL HIGH (ref 65–99)
Glucose-Capillary: 127 mg/dL — ABNORMAL HIGH (ref 65–99)
Glucose-Capillary: 155 mg/dL — ABNORMAL HIGH (ref 65–99)
Glucose-Capillary: 126 mg/dL — ABNORMAL HIGH (ref 65–99)

## 2017-12-12 LAB — PROTIME-INR
INR: 1.41
Prothrombin Time: 17.1 seconds — ABNORMAL HIGH (ref 11.4–15.2)

## 2017-12-12 LAB — APTT: aPTT: 32 seconds (ref 24–36)

## 2017-12-12 LAB — PLATELET COUNT: Platelets: 96 10*3/uL — ABNORMAL LOW (ref 150–400)

## 2017-12-12 LAB — HEMOGLOBIN AND HEMATOCRIT, BLOOD
HCT: 35.1 % — ABNORMAL LOW (ref 39.0–52.0)
Hemoglobin: 11.7 g/dL — ABNORMAL LOW (ref 13.0–17.0)

## 2017-12-12 LAB — CREATININE, SERUM
Creatinine, Ser: 0.76 mg/dL (ref 0.61–1.24)
GFR calc Af Amer: >60 mL/min (ref >60)
GFR calc non Af Amer: >60 mL/min (ref >60)

## 2017-12-12 LAB — MAGNESIUM: Magnesium: 2.8 mg/dL — ABNORMAL HIGH (ref 1.7–2.4)

## 2017-12-12 LAB — HEPARIN LEVEL (UNFRACTIONATED): Heparin Unfractionated: 0.52 IU/mL (ref 0.30–0.70)

## 2017-12-12 SURGERY — CORONARY ARTERY BYPASS GRAFTING (CABG)
Anesthesia: General | Site: Chest

## 2017-12-12 MED ORDER — LACTATED RINGERS IV SOLN
INTRAVENOUS | Status: DC | PRN
  Administered 2017-12-12: 07:00:00 via INTRAVENOUS

## 2017-12-12 MED ORDER — LACTATED RINGERS IV SOLN
INTRAVENOUS | Status: DC | PRN
  Administered 2017-12-12: 08:00:00 via INTRAVENOUS

## 2017-12-12 MED ORDER — PANTOPRAZOLE SODIUM 40 MG PO TBEC: 40.0000 mg | DELAYED_RELEASE_TABLET | Freq: Every day | ORAL | Status: DC

## 2017-12-12 MED ORDER — DOPAMINE-DEXTROSE 3.2-5 MG/ML-% IV SOLN: 1.5000 ug/kg/min | INTRAVENOUS | Status: AC

## 2017-12-12 MED ORDER — ACETAMINOPHEN 500 MG PO TABS
1000.0000 mg | ORAL_TABLET | Freq: Four times a day (QID) | ORAL | Status: DC
  Administered 2017-12-13 – 2017-12-14 (×6): 1000 mg via ORAL
  Filled 2017-12-12 (×5): qty 2

## 2017-12-12 MED ORDER — PROPOFOL 10 MG/ML IV BOLUS
INTRAVENOUS | Status: DC | PRN
  Administered 2017-12-12: 60 mg via INTRAVENOUS
  Administered 2017-12-12: 40 mg via INTRAVENOUS

## 2017-12-12 MED ORDER — FAMOTIDINE IN NACL 20-0.9 MG/50ML-% IV SOLN
20.0000 mg | Freq: Two times a day (BID) | INTRAVENOUS | Status: AC
  Administered 2017-12-12: 20 mg via INTRAVENOUS

## 2017-12-12 MED ORDER — LACTATED RINGERS IV SOLN
INTRAVENOUS | Status: DC | PRN
  Administered 2017-12-12: 06:00:00 via INTRAVENOUS

## 2017-12-12 MED ORDER — LACTATED RINGERS IV SOLN: INTRAVENOUS | Status: DC

## 2017-12-12 MED ORDER — MAGNESIUM SULFATE 4 GM/100ML IV SOLN
4.0000 g | Freq: Once | INTRAVENOUS | Status: AC
  Administered 2017-12-12: 4 g via INTRAVENOUS
  Filled 2017-12-12: qty 100

## 2017-12-12 MED ORDER — FENTANYL CITRATE (PF) 100 MCG/2ML IJ SOLN
50.0000 ug | INTRAMUSCULAR | Status: DC | PRN
  Administered 2017-12-12: 100 ug via INTRAVENOUS
  Filled 2017-12-12: qty 2

## 2017-12-12 MED ORDER — ONDANSETRON HCL 4 MG/2ML IJ SOLN
4.0000 mg | Freq: Four times a day (QID) | INTRAMUSCULAR | Status: DC | PRN
  Administered 2017-12-13 (×2): 4 mg via INTRAVENOUS
  Filled 2017-12-12 (×3): qty 2

## 2017-12-12 MED ORDER — FENTANYL CITRATE (PF) 250 MCG/5ML IJ SOLN
INTRAMUSCULAR | Status: AC
  Filled 2017-12-12: qty 5

## 2017-12-12 MED ORDER — SODIUM CHLORIDE 0.45 % IV SOLN: INTRAVENOUS | Status: DC | PRN

## 2017-12-12 MED ORDER — CHLORHEXIDINE GLUCONATE 0.12 % MT SOLN
15.0000 mL | OROMUCOSAL | Status: AC
  Administered 2017-12-12: 15 mL via OROMUCOSAL

## 2017-12-12 MED ORDER — SODIUM CHLORIDE 0.9 % IV SOLN
0.0000 ug/min | INTRAVENOUS | Status: DC
  Filled 2017-12-12: qty 2

## 2017-12-12 MED ORDER — VANCOMYCIN HCL IN DEXTROSE 1-5 GM/200ML-% IV SOLN
1000.0000 mg | Freq: Once | INTRAVENOUS | Status: AC
  Administered 2017-12-12: 1000 mg via INTRAVENOUS
  Filled 2017-12-12: qty 200

## 2017-12-12 MED ORDER — MIDAZOLAM HCL 10 MG/2ML IJ SOLN
INTRAMUSCULAR | Status: AC
  Filled 2017-12-12: qty 2

## 2017-12-12 MED ORDER — SODIUM CHLORIDE 0.9 % IV SOLN
INTRAVENOUS | Status: DC
  Administered 2017-12-13: 5.3 [IU]/h via INTRAVENOUS
  Filled 2017-12-12: qty 1

## 2017-12-12 MED ORDER — HEMOSTATIC AGENTS (NO CHARGE) OPTIME
TOPICAL | Status: DC | PRN
  Administered 2017-12-12 (×2): 1 via TOPICAL

## 2017-12-12 MED ORDER — ACETAMINOPHEN 160 MG/5ML PO SOLN: 1000.0000 mg | Freq: Four times a day (QID) | ORAL | Status: DC

## 2017-12-12 MED ORDER — ROCURONIUM BROMIDE 10 MG/ML (PF) SYRINGE
PREFILLED_SYRINGE | INTRAVENOUS | Status: AC
  Filled 2017-12-12: qty 5

## 2017-12-12 MED ORDER — PROTAMINE SULFATE 10 MG/ML IV SOLN
INTRAVENOUS | Status: AC
  Filled 2017-12-12: qty 25

## 2017-12-12 MED ORDER — ARTIFICIAL TEARS OPHTHALMIC OINT
TOPICAL_OINTMENT | OPHTHALMIC | Status: DC | PRN
  Administered 2017-12-12: 1 via OPHTHALMIC

## 2017-12-12 MED ORDER — SODIUM CHLORIDE 0.9 % IV SOLN
1.5000 g | Freq: Two times a day (BID) | INTRAVENOUS | Status: DC
  Administered 2017-12-12 – 2017-12-13 (×3): 1.5 g via INTRAVENOUS
  Filled 2017-12-12 (×4): qty 1.5

## 2017-12-12 MED ORDER — EPHEDRINE SULFATE 50 MG/ML IJ SOLN
INTRAMUSCULAR | Status: DC | PRN
  Administered 2017-12-12: 5 mg via INTRAVENOUS
  Administered 2017-12-12: 15 mg via INTRAVENOUS
  Administered 2017-12-12: 10 mg via INTRAVENOUS
  Administered 2017-12-12: 5 mg via INTRAVENOUS

## 2017-12-12 MED ORDER — PHENYLEPHRINE HCL 10 MG/ML IJ SOLN
INTRAMUSCULAR | Status: DC | PRN
  Administered 2017-12-12 (×4): 80 ug via INTRAVENOUS
  Administered 2017-12-12 (×2): 40 ug via INTRAVENOUS
  Administered 2017-12-12: 80 ug via INTRAVENOUS

## 2017-12-12 MED ORDER — SODIUM CHLORIDE 0.9 % IV SOLN: 250.0000 mL | INTRAVENOUS | Status: DC

## 2017-12-12 MED ORDER — INSULIN REGULAR BOLUS VIA INFUSION
0.0000 [IU] | Freq: Three times a day (TID) | INTRAVENOUS | Status: DC
  Administered 2017-12-13: 2 [IU] via INTRAVENOUS
  Filled 2017-12-12: qty 10

## 2017-12-12 MED ORDER — PROPOFOL 10 MG/ML IV BOLUS
INTRAVENOUS | Status: AC
  Filled 2017-12-12: qty 20

## 2017-12-12 MED ORDER — PHENYLEPHRINE 40 MCG/ML (10ML) SYRINGE FOR IV PUSH (FOR BLOOD PRESSURE SUPPORT)
PREFILLED_SYRINGE | INTRAVENOUS | Status: AC
  Filled 2017-12-12: qty 10

## 2017-12-12 MED ORDER — NITROGLYCERIN IN D5W 200-5 MCG/ML-% IV SOLN: 0.0000 ug/min | INTRAVENOUS | Status: DC

## 2017-12-12 MED ORDER — BISACODYL 5 MG PO TBEC
10.0000 mg | DELAYED_RELEASE_TABLET | Freq: Every day | ORAL | Status: DC
  Administered 2017-12-13: 10 mg via ORAL
  Filled 2017-12-12: qty 2

## 2017-12-12 MED ORDER — ACETAMINOPHEN 650 MG RE SUPP
650.0000 mg | Freq: Once | RECTAL | Status: AC
  Administered 2017-12-12: 650 mg via RECTAL

## 2017-12-12 MED ORDER — DEXMEDETOMIDINE HCL IN NACL 200 MCG/50ML IV SOLN: 0.0000 ug/kg/h | INTRAVENOUS | Status: DC

## 2017-12-12 MED ORDER — MILRINONE LACTATE IN DEXTROSE 20-5 MG/100ML-% IV SOLN
0.3000 ug/kg/min | INTRAVENOUS | Status: DC
  Administered 2017-12-12 – 2017-12-13 (×3): 0.3 ug/kg/min via INTRAVENOUS
  Filled 2017-12-12 (×3): qty 100

## 2017-12-12 MED ORDER — SODIUM CHLORIDE 0.9% FLUSH
3.0000 mL | INTRAVENOUS | Status: DC | PRN
  Administered 2017-12-13: 3 mL via INTRAVENOUS
  Filled 2017-12-12: qty 3

## 2017-12-12 MED ORDER — 0.9 % SODIUM CHLORIDE (POUR BTL) OPTIME
TOPICAL | Status: DC | PRN
  Administered 2017-12-12: 6000 mL

## 2017-12-12 MED ORDER — ARTIFICIAL TEARS OPHTHALMIC OINT
TOPICAL_OINTMENT | OPHTHALMIC | Status: AC
  Filled 2017-12-12: qty 3.5

## 2017-12-12 MED ORDER — ASPIRIN 81 MG PO CHEW: 324.0000 mg | CHEWABLE_TABLET | Freq: Every day | ORAL | Status: DC

## 2017-12-12 MED ORDER — METOPROLOL TARTRATE 12.5 MG HALF TABLET
12.5000 mg | ORAL_TABLET | Freq: Two times a day (BID) | ORAL | Status: DC
  Administered 2017-12-13: 12.5 mg via ORAL
  Filled 2017-12-12: qty 1

## 2017-12-12 MED ORDER — ROCURONIUM BROMIDE 10 MG/ML (PF) SYRINGE
PREFILLED_SYRINGE | INTRAVENOUS | Status: DC | PRN
  Administered 2017-12-12 (×2): 50 mg via INTRAVENOUS
  Administered 2017-12-12: 100 mg via INTRAVENOUS
  Administered 2017-12-12: 50 mg via INTRAVENOUS

## 2017-12-12 MED ORDER — FENTANYL CITRATE (PF) 250 MCG/5ML IJ SOLN
INTRAMUSCULAR | Status: AC
  Filled 2017-12-12: qty 20

## 2017-12-12 MED ORDER — ASPIRIN EC 325 MG PO TBEC
325.0000 mg | DELAYED_RELEASE_TABLET | Freq: Every day | ORAL | Status: DC
  Administered 2017-12-13: 325 mg via ORAL
  Filled 2017-12-12: qty 1

## 2017-12-12 MED ORDER — SODIUM CHLORIDE 0.9 % IJ SOLN
OROMUCOSAL | Status: DC | PRN
  Administered 2017-12-12 (×3): 4 mL via TOPICAL

## 2017-12-12 MED ORDER — SODIUM CHLORIDE 0.9 % IV SOLN
INTRAVENOUS | Status: DC | PRN
  Administered 2017-12-12: 12:00:00 via INTRAVENOUS

## 2017-12-12 MED ORDER — SODIUM CHLORIDE 0.9% FLUSH
3.0000 mL | Freq: Two times a day (BID) | INTRAVENOUS | Status: DC
  Administered 2017-12-13: 3 mL via INTRAVENOUS

## 2017-12-12 MED ORDER — SODIUM CHLORIDE 0.9 % IV SOLN
INTRAVENOUS | Status: DC | PRN
  Administered 2017-12-12: 750 mg via INTRAVENOUS

## 2017-12-12 MED ORDER — BISACODYL 10 MG RE SUPP: 10.0000 mg | Freq: Every day | RECTAL | Status: DC

## 2017-12-12 MED ORDER — LACTATED RINGERS IV SOLN: 500.0000 mL | Freq: Once | INTRAVENOUS | Status: DC | PRN

## 2017-12-12 MED ORDER — MAGNESIUM SULFATE 2 GM/50ML IV SOLN
INTRAVENOUS | Status: AC
  Filled 2017-12-12: qty 50

## 2017-12-12 MED ORDER — POTASSIUM CHLORIDE 10 MEQ/50ML IV SOLN
10.0000 meq | INTRAVENOUS | Status: AC
  Administered 2017-12-12 (×3): 10 meq via INTRAVENOUS

## 2017-12-12 MED ORDER — HEPARIN SODIUM (PORCINE) 1000 UNIT/ML IJ SOLN
INTRAMUSCULAR | Status: AC
  Filled 2017-12-12: qty 1

## 2017-12-12 MED ORDER — DOCUSATE SODIUM 100 MG PO CAPS
200.0000 mg | ORAL_CAPSULE | Freq: Every day | ORAL | Status: DC
  Administered 2017-12-13: 200 mg via ORAL
  Filled 2017-12-12: qty 2

## 2017-12-12 MED ORDER — MIDAZOLAM HCL 5 MG/5ML IJ SOLN
INTRAMUSCULAR | Status: DC | PRN
  Administered 2017-12-12 (×2): 1 mg via INTRAVENOUS
  Administered 2017-12-12: 5 mg via INTRAVENOUS
  Administered 2017-12-12: 1 mg via INTRAVENOUS
  Administered 2017-12-12: 2 mg via INTRAVENOUS

## 2017-12-12 MED ORDER — FENTANYL CITRATE (PF) 250 MCG/5ML IJ SOLN
INTRAMUSCULAR | Status: DC | PRN
  Administered 2017-12-12: 250 ug via INTRAVENOUS
  Administered 2017-12-12: 50 ug via INTRAVENOUS
  Administered 2017-12-12: 250 ug via INTRAVENOUS
  Administered 2017-12-12: 50 ug via INTRAVENOUS
  Administered 2017-12-12: 100 ug via INTRAVENOUS
  Administered 2017-12-12: 250 ug via INTRAVENOUS
  Administered 2017-12-12: 50 ug via INTRAVENOUS
  Administered 2017-12-12: 100 ug via INTRAVENOUS
  Administered 2017-12-12: 150 ug via INTRAVENOUS

## 2017-12-12 MED ORDER — TRAMADOL HCL 50 MG PO TABS
50.0000 mg | ORAL_TABLET | ORAL | Status: DC | PRN
  Administered 2017-12-14: 100 mg via ORAL
  Filled 2017-12-12: qty 2

## 2017-12-12 MED ORDER — KETOROLAC TROMETHAMINE 15 MG/ML IJ SOLN
15.0000 mg | Freq: Three times a day (TID) | INTRAMUSCULAR | Status: AC | PRN
  Administered 2017-12-13: 15 mg via INTRAVENOUS
  Filled 2017-12-12: qty 1

## 2017-12-12 MED ORDER — METOPROLOL TARTRATE 25 MG/10 ML ORAL SUSPENSION: 12.5000 mg | Freq: Two times a day (BID) | ORAL | Status: DC

## 2017-12-12 MED ORDER — SODIUM CHLORIDE 0.9 % IV SOLN: INTRAVENOUS | Status: DC

## 2017-12-12 MED ORDER — ACETAMINOPHEN 160 MG/5ML PO SOLN: 650.0000 mg | Freq: Once | ORAL | Status: AC

## 2017-12-12 MED ORDER — ALBUMIN HUMAN 5 % IV SOLN
250.0000 mL | INTRAVENOUS | Status: AC | PRN
  Administered 2017-12-12 (×3): 250 mL via INTRAVENOUS
  Filled 2017-12-12: qty 250

## 2017-12-12 MED ORDER — METOPROLOL TARTRATE 5 MG/5ML IV SOLN: 2.5000 mg | INTRAVENOUS | Status: DC | PRN

## 2017-12-12 MED ORDER — MIDAZOLAM HCL 2 MG/2ML IJ SOLN
2.0000 mg | INTRAMUSCULAR | Status: DC | PRN
  Filled 2017-12-12: qty 2

## 2017-12-12 MED ORDER — HEPARIN SODIUM (PORCINE) 1000 UNIT/ML IJ SOLN
INTRAMUSCULAR | Status: DC | PRN
  Administered 2017-12-12: 33000 [IU] via INTRAVENOUS

## 2017-12-12 MED ORDER — PROTAMINE SULFATE 10 MG/ML IV SOLN
INTRAVENOUS | Status: DC | PRN
  Administered 2017-12-12 (×5): 50 mg via INTRAVENOUS
  Administered 2017-12-12: 30 mg via INTRAVENOUS
  Administered 2017-12-12: 50 mg via INTRAVENOUS

## 2017-12-12 MED ORDER — EPHEDRINE 5 MG/ML INJ
INTRAVENOUS | Status: AC
  Filled 2017-12-12: qty 10

## 2017-12-12 SURGICAL SUPPLY — 75 items
BAG DECANTER FOR FLEXI CONT (MISCELLANEOUS) ×3 IMPLANT
BANDAGE ACE 4X5 VEL STRL LF (GAUZE/BANDAGES/DRESSINGS) ×3 IMPLANT
BANDAGE ACE 6X5 VEL STRL LF (GAUZE/BANDAGES/DRESSINGS) ×3 IMPLANT
BANDAGE ELASTIC 4 VELCRO ST LF (GAUZE/BANDAGES/DRESSINGS) ×3 IMPLANT
BANDAGE ELASTIC 6 VELCRO ST LF (GAUZE/BANDAGES/DRESSINGS) ×3 IMPLANT
BLADE STERNUM SYSTEM 6 (BLADE) ×3 IMPLANT
BNDG GAUZE ELAST 4 BULKY (GAUZE/BANDAGES/DRESSINGS) ×3 IMPLANT
CANISTER SUCT 3000ML PPV (MISCELLANEOUS) ×3 IMPLANT
CATH CPB KIT GERHARDT (MISCELLANEOUS) ×3 IMPLANT
CATH THORACIC 28FR (CATHETERS) ×3 IMPLANT
CRADLE DONUT ADULT HEAD (MISCELLANEOUS) ×3 IMPLANT
DERMABOND ADHESIVE PROPEN (GAUZE/BANDAGES/DRESSINGS) ×1
DERMABOND ADVANCED .7 DNX6 (GAUZE/BANDAGES/DRESSINGS) ×2 IMPLANT
DRAIN CHANNEL 28F RND 3/8 FF (WOUND CARE) ×3 IMPLANT
DRAPE CARDIOVASCULAR INCISE (DRAPES) ×1
DRAPE SLUSH/WARMER DISC (DRAPES) ×3 IMPLANT
DRAPE SRG 135X102X78XABS (DRAPES) ×2 IMPLANT
DRSG AQUACEL AG ADV 3.5X14 (GAUZE/BANDAGES/DRESSINGS) ×3 IMPLANT
ELECT BLADE 4.0 EZ CLEAN MEGAD (MISCELLANEOUS) ×3
ELECT REM PT RETURN 9FT ADLT (ELECTROSURGICAL) ×6
ELECTRODE BLDE 4.0 EZ CLN MEGD (MISCELLANEOUS) ×2 IMPLANT
ELECTRODE REM PT RTRN 9FT ADLT (ELECTROSURGICAL) ×4 IMPLANT
FELT TEFLON 1X6 (MISCELLANEOUS) ×3 IMPLANT
FLOSEAL 10ML (HEMOSTASIS) ×3 IMPLANT
GAUZE SPONGE 4X4 12PLY STRL (GAUZE/BANDAGES/DRESSINGS) ×6 IMPLANT
GAUZE SPONGE 4X4 12PLY STRL LF (GAUZE/BANDAGES/DRESSINGS) ×6 IMPLANT
GLOVE BIO SURGEON STRL SZ 6 (GLOVE) ×6 IMPLANT
GLOVE BIO SURGEON STRL SZ 6.5 (GLOVE) ×15 IMPLANT
GLOVE BIO SURGEON STRL SZ7.5 (GLOVE) ×6 IMPLANT
GLOVE BIOGEL PI IND STRL 6 (GLOVE) ×8 IMPLANT
GLOVE BIOGEL PI IND STRL 6.5 (GLOVE) ×10 IMPLANT
GLOVE BIOGEL PI INDICATOR 6 (GLOVE) ×4
GLOVE BIOGEL PI INDICATOR 6.5 (GLOVE) ×5
GOWN STRL REUS W/ TWL LRG LVL3 (GOWN DISPOSABLE) ×16 IMPLANT
GOWN STRL REUS W/TWL LRG LVL3 (GOWN DISPOSABLE) ×8
HEMOSTAT POWDER SURGIFOAM 1G (HEMOSTASIS) ×9 IMPLANT
HEMOSTAT SURGICEL 2X14 (HEMOSTASIS) ×3 IMPLANT
KIT BASIN OR (CUSTOM PROCEDURE TRAY) ×3 IMPLANT
KIT CATH SUCT 8FR (CATHETERS) ×3 IMPLANT
KIT SUCTION CATH 14FR (SUCTIONS) ×6 IMPLANT
KIT TURNOVER KIT B (KITS) ×3 IMPLANT
KIT VASOVIEW HEMOPRO VH 3000 (KITS) ×3 IMPLANT
LEAD PACING MYOCARDI (MISCELLANEOUS) ×3 IMPLANT
MARKER GRAFT CORONARY BYPASS (MISCELLANEOUS) ×6 IMPLANT
MASK FACE 3 LYR ANT FOG FR FLM (MASK) ×2 IMPLANT
MASK SURG FACE ANTI FOG ADLT (MASK) ×1
NS IRRIG 1000ML POUR BTL (IV SOLUTION) ×18 IMPLANT
PACK E OPEN HEART (SUTURE) ×3 IMPLANT
PACK OPEN HEART (CUSTOM PROCEDURE TRAY) ×3 IMPLANT
PAD ARMBOARD 7.5X6 YLW CONV (MISCELLANEOUS) ×6 IMPLANT
PAD ELECT DEFIB RADIOL ZOLL (MISCELLANEOUS) ×3 IMPLANT
PENCIL BUTTON HOLSTER BLD 10FT (ELECTRODE) ×3 IMPLANT
SET CARDIOPLEGIA MPS 5001102 (MISCELLANEOUS) ×3 IMPLANT
SPONGE LAP 18X18 RF (DISPOSABLE) ×3 IMPLANT
SUT BONE WAX W31G (SUTURE) ×3 IMPLANT
SUT PROLENE 3 0 SH1 36 (SUTURE) ×6 IMPLANT
SUT PROLENE 4 0 TF (SUTURE) ×6 IMPLANT
SUT PROLENE 6 0 CC (SUTURE) ×6 IMPLANT
SUT PROLENE 7 0 BV1 MDA (SUTURE) ×3 IMPLANT
SUT PROLENE 8 0 BV175 6 (SUTURE) ×3 IMPLANT
SUT STEEL 6MS V (SUTURE) ×3 IMPLANT
SUT STEEL SZ 6 DBL 3X14 BALL (SUTURE) ×3 IMPLANT
SUT VIC AB 1 CTX 18 (SUTURE) ×6 IMPLANT
SUT VIC AB 2-0 CT1 27 (SUTURE) ×1
SUT VIC AB 2-0 CT1 TAPERPNT 27 (SUTURE) ×2 IMPLANT
SUT VIC AB 3-0 X1 27 (SUTURE) ×3 IMPLANT
SYSTEM SAHARA CHEST DRAIN ATS (WOUND CARE) ×3 IMPLANT
TAPE CLOTH SURG 4X10 WHT LF (GAUZE/BANDAGES/DRESSINGS) ×3 IMPLANT
TAPE PAPER 2X10 WHT MICROPORE (GAUZE/BANDAGES/DRESSINGS) ×3 IMPLANT
TOWEL GREEN STERILE (TOWEL DISPOSABLE) IMPLANT
TOWEL GREEN STERILE FF (TOWEL DISPOSABLE) ×3 IMPLANT
TRAY FOLEY SILVER 16FR TEMP (SET/KITS/TRAYS/PACK) ×3 IMPLANT
TUBING INSUFFLATION (TUBING) ×3 IMPLANT
UNDERPAD 30X30 (UNDERPADS AND DIAPERS) ×3 IMPLANT
WATER STERILE IRR 1000ML POUR (IV SOLUTION) ×6 IMPLANT

## 2017-12-12 NOTE — Procedures (Signed)
Extubation Procedure Note  Patient Details:   Name: Phillip [name] DOB: 01-13-1941 MRN: [id number]   Airway Documentation:    Vent end date: 12/12/17 Vent end time: 1759   Evaluation  O2 sats: stable throughout Complications: No apparent complications Patient did tolerate procedure well. Bilateral Breath Sounds: Diminished   Yes   Patient extubated to 4L [place]. Patient tolerating well at this time. RT will continue to monitor.  Mcneil Sober 12/12/2017, 6:07 PM

## 2017-12-12 NOTE — Anesthesia Procedure Notes (Signed)
Arterial Line Insertion Performed by: Barrington Ellison, CRNA, CRNA  Preanesthetic checklist: patient identified Lidocaine 1% used for infiltration and patient sedated Left, radial was placed Catheter size: 20 G Hand hygiene performed  and maximum sterile barriers used  Allen's test indicative of satisfactory collateral circulation Attempts: 1 Procedure performed without using ultrasound guided technique. Following insertion, dressing applied and Biopatch. Post procedure assessment: normal  Patient tolerated the procedure well with no immediate complications.

## 2017-12-12 NOTE — Anesthesia Procedure Notes (Signed)
Procedure Name: Intubation Date/Time: 12/12/2017 7:46 AM Performed by: Barrington Ellison, CRNA Pre-anesthesia Checklist: Patient identified, Emergency Drugs available, Suction available and Patient being monitored Patient Re-evaluated:Patient Re-evaluated prior to induction Oxygen Delivery Method: Circle System Utilized Preoxygenation: Pre-oxygenation with 100% oxygen Induction Type: IV induction Ventilation: Mask ventilation without difficulty Laryngoscope Size: Mac and 4 Grade View: Grade III Tube type: Oral Tube size: 8.0 mm Number of attempts: 1 Airway Equipment and Method: Stylet and Oral airway Placement Confirmation: ETT inserted through vocal cords under direct vision,  positive ETCO2 and breath sounds checked- equal and bilateral Secured at: 23 cm Tube secured with: Tape Dental Injury: Teeth and Oropharynx as per pre-operative assessment

## 2017-12-12 NOTE — Progress Notes (Signed)
CT surgery p.m. Rounds  Patient breathing comfortably after earlier extubation status post multivessel CABG today Hemodynamic stable 6-hour labs satisfactory

## 2017-12-12 NOTE — Anesthesia Postprocedure Evaluation (Signed)
Anesthesia Post Note  Patient: Phillip [name]  Procedure(s) Performed: CORONARY ARTERY BYPASS GRAFTING (CABG) x3 using the right greater saphenous vein harvested endoscopically and the left internal mammary artery. LIMA to LAD, SEQ SVG to OM1 & OM2 (N/A Chest) TRANSESOPHAGEAL ECHOCARDIOGRAM (TEE) (N/A )     Patient location during evaluation: SICU Anesthesia Type: General Level of consciousness: sedated and patient remains intubated per anesthesia plan Pain management: pain level controlled Vital Signs Assessment: post-procedure vital signs reviewed and stable Respiratory status: patient remains intubated per anesthesia plan and patient on ventilator - see flowsheet for VS Cardiovascular status: stable Anesthetic complications: no    Last Vitals:  Vitals:   12/12/17 1415 12/12/17 1430  BP:    Pulse: 88 90  Resp: (!) 24 12  Temp: 36.5 C 36.6 C  SpO2: 98% 97%    Last Pain:  Vitals:   12/11/17 2033  TempSrc:   PainSc: 0-No pain                 Nolon Nations

## 2017-12-12 NOTE — Progress Notes (Signed)
  Echocardiogram 2D Echocardiogram has been performed.  Merrie Roof F 12/12/2017, 8:49 AM

## 2017-12-12 NOTE — Progress Notes (Signed)
Patient ID: Phillip [name], male   DOB: December 13, 1940, 77 y.o.   MRN: [id number] Pre Procedure note for inpatients:       [address]       [place] [address]             [phone]      Phillip [name] has been scheduled for Procedure(s): CORONARY ARTERY BYPASS GRAFTING (CABG) (N/A) TRANSESOPHAGEAL ECHOCARDIOGRAM (TEE) (N/A) today. The various methods of treatment have been discussed with the patient. After consideration of the risks, benefits and treatment options the patient has consented to the planned procedure.   The patient has been seen and labs reviewed. There are no changes in the patient's condition to prevent proceeding with the planned procedure today.  Recent labs:  Lab Results  Component Value Date   WBC 6.0 12/12/2017   HGB 14.4 12/12/2017   HCT 42.4 12/12/2017   PLT 115 (L) 12/12/2017   GLUCOSE 105 (H) 12/11/2017   CHOL 138 11/07/2017   TRIG 144.0 11/07/2017   HDL 44.40 11/07/2017   LDLDIRECT 101.0 10/13/2014   LDLCALC 65 11/07/2017   ALT 18 12/11/2017   AST 19 12/11/2017   NA 140 12/11/2017   K 3.9 12/11/2017   CL 107 12/11/2017   CREATININE 0.84 12/11/2017   BUN 20 12/11/2017   CO2 24 12/11/2017   TSH 4.84 (H) 11/07/2017   PSA 0.59 11/07/2017   INR 1.26 12/11/2017   HGBA1C 6.0 11/07/2017   MICROALBUR 0.9 10/13/2014    [name], MD 12/12/2017 7:08 AM

## 2017-12-12 NOTE — Brief Op Note (Signed)
12/10/2017 - 12/12/2017  12:40 PM  PATIENT:  Phillip [name]  77 y.o. male  PRE-OPERATIVE DIAGNOSIS:  CAD  POST-OPERATIVE DIAGNOSIS:  CAD  PROCEDURE:  Procedure(s): CORONARY ARTERY BYPASS GRAFTING (CABG) x3 using the right greater saphenous vein harvested endoscopically and the left internal mammary artery. LIMA to LAD, SEQ SVG to OM1 & OM2 (N/A) TRANSESOPHAGEAL ECHOCARDIOGRAM (TEE) (N/A)  SURGEON:  Surgeon(s) and Role:    * [name], MD - Primary  PHYSICIAN ASSISTANT: WAYNE GOLD PA-C   ANESTHESIA:   general  EBL:  850 mL   BLOOD ADMINISTERED:none  DRAINS: CHEST DRAINS IN PLEURAL AND PERICARDIAL SPACE   LOCAL MEDICATIONS USED:  NONE  SPECIMEN:  No Specimen  DISPOSITION OF SPECIMEN:  N/A  COUNTS:  YES  TOURNIQUET:  * No tourniquets in log *  DICTATION: .Other Dictation: Dictation Number PENDING  PLAN OF CARE: Admit to inpatient   PATIENT DISPOSITION:  ICU - intubated and hemodynamically stable.   Delay start of Pharmacological VTE agent (>24hrs) due to surgical blood loss or risk of bleeding: yes  COMPLICATIONS: NO KNOWN

## 2017-12-12 NOTE — Anesthesia Procedure Notes (Signed)
Central Venous Catheter Insertion Performed by: Jackson, Carswell, MD, anesthesiologist Start/End4/24/2019 7:00 AM, 12/12/2017 7:17 AM Patient location: Pre-op. Preanesthetic checklist: patient identified, IV checked, risks and benefits discussed, surgical consent, monitors and equipment checked, pre-op evaluation, timeout performed and anesthesia consent Position: Trendelenburg Lidocaine 1% used for infiltration and patient sedated Hand hygiene performed , maximum sterile barriers used  and Seldinger technique used Catheter size: 8.5 Fr PA cath was placed.Sheath introducer Swan type:thermodilution Procedure performed using ultrasound guided technique. Ultrasound Notes:anatomy identified, needle tip was noted to be adjacent to the nerve/plexus identified, no ultrasound evidence of intravascular and/or intraneural injection and image(s) printed for medical record Attempts: 1 Following insertion, line sutured, dressing applied and Biopatch. Post procedure assessment: blood return through all ports, free fluid flow and no air  Patient tolerated the procedure well with no immediate complications. Additional procedure comments: PA catheter:  Routine monitors. Timeout, sterile prep, drape, FBP R neck.  Trendelenburg position.  1% Lido local, finder and trocar RIJ 1st pass with US guidance.  J wire difficult to pass, trocar manipulated to facilitate J wire insertion and Cordis placed. PA catheter in easily.  Sterile dressing and BioPatch applied.  Patient tolerated well, VSS.  C Jackson, MD.           

## 2017-12-12 NOTE — Transfer of Care (Signed)
Immediate Anesthesia Transfer of Care Note  Patient: Phillip [name]  Procedure(s) Performed: CORONARY ARTERY BYPASS GRAFTING (CABG) x3 using the right greater saphenous vein harvested endoscopically and the left internal mammary artery. LIMA to LAD, SEQ SVG to OM1 & OM2 (N/A Chest) TRANSESOPHAGEAL ECHOCARDIOGRAM (TEE) (N/A )  Patient Location: ICU  Anesthesia Type:General  Level of Consciousness: Patient remains intubated per anesthesia plan  Airway & Oxygen Therapy: Patient remains intubated per anesthesia plan and Patient placed on Ventilator (see vital sign flow sheet for setting)  Post-op Assessment: Report given to RN  Post vital signs: Reviewed and stable  Last Vitals:  Vitals Value Taken Time  BP    Temp    Pulse    Resp    SpO2      Last Pain:  Vitals:   12/11/17 2033  TempSrc:   PainSc: 0-No pain         Complications: No apparent anesthesia complications

## 2017-12-13 ENCOUNTER — Encounter (HOSPITAL_COMMUNITY): Payer: Self-pay

## 2017-12-13 ENCOUNTER — Inpatient Hospital Stay (HOSPITAL_COMMUNITY): Payer: PPO

## 2017-12-13 LAB — CBC
HCT: 40.6 % (ref 39.0–52.0)
HCT: 38 % — ABNORMAL LOW (ref 39.0–52.0)
HCT: 36.8 % — ABNORMAL LOW (ref 39.0–52.0)
Hemoglobin: 13.9 g/dL (ref 13.0–17.0)
Hemoglobin: 13.2 g/dL (ref 13.0–17.0)
Hemoglobin: 12.6 g/dL — ABNORMAL LOW (ref 13.0–17.0)
MCH: 31.7 pg (ref 26.0–34.0)
MCH: 32.5 pg (ref 26.0–34.0)
MCH: 32.4 pg (ref 26.0–34.0)
MCHC: 34.2 g/dL (ref 30.0–36.0)
MCHC: 34.7 g/dL (ref 30.0–36.0)
MCHC: 34.2 g/dL (ref 30.0–36.0)
MCV: 92.7 fL (ref 78.0–100.0)
MCV: 93.6 fL (ref 78.0–100.0)
MCV: 94.6 fL (ref 78.0–100.0)
PLATELETS: 99 10*3/uL — AB (ref 150–400)
Platelets: 101 K/uL — ABNORMAL LOW (ref 150–400)
Platelets: 95 10*3/uL — ABNORMAL LOW (ref 150–400)
RBC: 4.38 MIL/uL (ref 4.22–5.81)
RBC: 4.06 MIL/uL — ABNORMAL LOW (ref 4.22–5.81)
RBC: 3.89 MIL/uL — AB (ref 4.22–5.81)
RDW: 13.3 % (ref 11.5–15.5)
RDW: 13.6 % (ref 11.5–15.5)
RDW: 14.1 % (ref 11.5–15.5)
WBC: 16.7 K/uL — ABNORMAL HIGH (ref 4.0–10.5)
WBC: 19.1 10*3/uL — ABNORMAL HIGH (ref 4.0–10.5)
WBC: 19.4 10*3/uL — AB (ref 4.0–10.5)

## 2017-12-13 LAB — GLUCOSE, CAPILLARY
GLUCOSE-CAPILLARY: 129 mg/dL — AB (ref 65–99)
GLUCOSE-CAPILLARY: 131 mg/dL — AB (ref 65–99)
GLUCOSE-CAPILLARY: 136 mg/dL — AB (ref 65–99)
GLUCOSE-CAPILLARY: 147 mg/dL — AB (ref 65–99)
GLUCOSE-CAPILLARY: 155 mg/dL — AB (ref 65–99)
GLUCOSE-CAPILLARY: 158 mg/dL — AB (ref 65–99)
Glucose-Capillary: 124 mg/dL — ABNORMAL HIGH (ref 65–99)
Glucose-Capillary: 127 mg/dL — ABNORMAL HIGH (ref 65–99)
Glucose-Capillary: 122 mg/dL — ABNORMAL HIGH (ref 65–99)
Glucose-Capillary: 151 mg/dL — ABNORMAL HIGH (ref 65–99)
Glucose-Capillary: 136 mg/dL — ABNORMAL HIGH (ref 65–99)
Glucose-Capillary: 132 mg/dL — ABNORMAL HIGH (ref 65–99)
Glucose-Capillary: 185 mg/dL — ABNORMAL HIGH (ref 65–99)
Glucose-Capillary: 174 mg/dL — ABNORMAL HIGH (ref 65–99)

## 2017-12-13 LAB — POCT I-STAT, CHEM 8
BUN: 38 mg/dL — AB (ref 6–20)
BUN: 35 mg/dL — ABNORMAL HIGH (ref 6–20)
CREATININE: 1.3 mg/dL — AB (ref 0.61–1.24)
Calcium, Ion: 1.14 mmol/L — ABNORMAL LOW (ref 1.15–1.40)
Calcium, Ion: 1.17 mmol/L (ref 1.15–1.40)
Chloride: 103 mmol/L (ref 101–111)
Chloride: 104 mmol/L (ref 101–111)
Creatinine, Ser: 1.2 mg/dL (ref 0.61–1.24)
Glucose, Bld: 190 mg/dL — ABNORMAL HIGH (ref 65–99)
Glucose, Bld: 193 mg/dL — ABNORMAL HIGH (ref 65–99)
HCT: 35 % — ABNORMAL LOW (ref 39.0–52.0)
HEMATOCRIT: 34 % — AB (ref 39.0–52.0)
HEMOGLOBIN: 11.6 g/dL — AB (ref 13.0–17.0)
Hemoglobin: 11.9 g/dL — ABNORMAL LOW (ref 13.0–17.0)
POTASSIUM: 4.9 mmol/L (ref 3.5–5.1)
Potassium: 5 mmol/L (ref 3.5–5.1)
SODIUM: 137 mmol/L (ref 135–145)
Sodium: 136 mmol/L (ref 135–145)
TCO2: 23 mmol/L (ref 22–32)
TCO2: 24 mmol/L (ref 22–32)

## 2017-12-13 LAB — BASIC METABOLIC PANEL WITH GFR
Anion gap: 6 (ref 5–15)
BUN: 16 mg/dL (ref 6–20)
CO2: 22 mmol/L (ref 22–32)
Calcium: 8.3 mg/dL — ABNORMAL LOW (ref 8.9–10.3)
Chloride: 111 mmol/L (ref 101–111)
Creatinine, Ser: 0.78 mg/dL (ref 0.61–1.24)
GFR calc Af Amer: >60 mL/min
GFR calc non Af Amer: >60 mL/min
Glucose, Bld: 137 mg/dL — ABNORMAL HIGH (ref 65–99)
Potassium: 4.4 mmol/L (ref 3.5–5.1)
Sodium: 139 mmol/L (ref 135–145)

## 2017-12-13 LAB — CREATININE, SERUM
Creatinine, Ser: 1.23 mg/dL (ref 0.61–1.24)
Creatinine, Ser: 1.44 mg/dL — ABNORMAL HIGH (ref 0.61–1.24)
GFR calc Af Amer: >60 mL/min (ref >60)
GFR calc non Af Amer: 54 mL/min — ABNORMAL LOW (ref >60)
GFR, EST AFRICAN AMERICAN: 52 mL/min — AB (ref >60)
GFR, EST NON AFRICAN AMERICAN: 45 mL/min — AB (ref >60)

## 2017-12-13 LAB — MAGNESIUM
MAGNESIUM: 2.5 mg/dL — AB (ref 1.7–2.4)
Magnesium: 2.3 mg/dL (ref 1.7–2.4)

## 2017-12-13 MED ORDER — ENOXAPARIN SODIUM 30 MG/0.3ML ~~LOC~~ SOLN
30.0000 mg | Freq: Every day | SUBCUTANEOUS | Status: DC
  Administered 2017-12-13 – 2017-12-17 (×5): 30 mg via SUBCUTANEOUS
  Filled 2017-12-13 (×5): qty 0.3

## 2017-12-13 MED ORDER — INSULIN ASPART 100 UNIT/ML ~~LOC~~ SOLN
0.0000 [IU] | SUBCUTANEOUS | Status: DC
  Administered 2017-12-13: 4 [IU] via SUBCUTANEOUS
  Administered 2017-12-13 – 2017-12-14 (×3): 2 [IU] via SUBCUTANEOUS

## 2017-12-13 MED ORDER — INSULIN DETEMIR 100 UNIT/ML ~~LOC~~ SOLN: 15.0000 [IU] | Freq: Every day | SUBCUTANEOUS | Status: DC

## 2017-12-13 MED ORDER — INSULIN DETEMIR 100 UNIT/ML ~~LOC~~ SOLN
15.0000 [IU] | Freq: Every day | SUBCUTANEOUS | Status: DC
  Administered 2017-12-13: 15 [IU] via SUBCUTANEOUS
  Filled 2017-12-13 (×4): qty 0.15

## 2017-12-13 MED ORDER — GLUCERNA SHAKE PO LIQD
711.0000 mL | Freq: Four times a day (QID) | ORAL | Status: DC
  Administered 2017-12-13: 711 mL via ORAL
  Filled 2017-12-13: qty 711

## 2017-12-13 MED ORDER — GLUCERNA SHAKE PO LIQD
711.0000 mL | Freq: Four times a day (QID) | ORAL | Status: DC
  Administered 2017-12-13: 711 mL via ORAL
  Administered 2017-12-14: 237 mL via ORAL
  Administered 2017-12-14: 711 mL via ORAL
  Administered 2017-12-14 (×3): 237 mL via ORAL
  Administered 2017-12-15 – 2017-12-17 (×6): 711 mL via ORAL
  Filled 2017-12-13 (×5): qty 711

## 2017-12-13 MED FILL — Magnesium Sulfate Inj 50%: INTRAMUSCULAR | Qty: 10 | Status: AC

## 2017-12-13 MED FILL — Potassium Chloride Inj 2 mEq/ML: INTRAVENOUS | Qty: 40 | Status: AC

## 2017-12-13 MED FILL — Heparin Sodium (Porcine) Inj 1000 Unit/ML: INTRAMUSCULAR | Qty: 30 | Status: AC

## 2017-12-13 NOTE — Plan of Care (Signed)
Pt on low-dose dopamine, UOP 50-130cc/hr. BP good on 30mcg Neo. No c/o pain. 2000 on IS. AOO 90bpm per MD during day shift. Will continue to monitor and promote pulmonary toilet.   Problem: Elimination: Goal: Will not experience complications related to bowel motility Outcome: Progressing   Problem: Education: Goal: Knowledge of the prescribed therapeutic regimen will improve Outcome: Progressing   Problem: Cardiac: Goal: Hemodynamic stability will improve Outcome: Progressing   Problem: Respiratory: Goal: Respiratory status will improve Outcome: Progressing   Problem: Urinary Elimination: Goal: Ability to achieve and maintain adequate renal perfusion and functioning will improve Outcome: Progressing

## 2017-12-13 NOTE — Progress Notes (Signed)
Patient ID: Phillip [name], male   DOB: 11/11/1940, 77 y.o.   MRN: [id number] TCTS DAILY ICU PROGRESS NOTE                   [address]            [place] [address]          [phone]   1 Day Post-Op Procedure(s) (LRB): CORONARY ARTERY BYPASS GRAFTING (CABG) x3 using the right greater saphenous vein harvested endoscopically and the left internal mammary artery. LIMA to LAD, SEQ SVG to OM1 & OM2 (N/A) TRANSESOPHAGEAL ECHOCARDIOGRAM (TEE) (N/A)  Total Length of Stay:  LOS: 3 days   Subjective: Patient awake alert neurologically intact, sitting in bed doing Sudoku puzzles  Objective: Vital signs in last 24 hours: Temp:  [97.7 F (36.5 C)-99.1 F (37.3 C)] 99 F (37.2 C) (04/25 0700) Pulse Rate:  [75-91] 75 (04/25 0700) Cardiac Rhythm: Normal sinus rhythm;Atrial paced (04/25 0700) Resp:  [5-29] 29 (04/25 0700) BP: (88-138)/(50-85) 111/62 (04/25 0500) SpO2:  [93 %-99 %] 93 % (04/25 0700) Arterial Line BP: (78-148)/(45-85) 91/45 (04/25 0700) FiO2 (%):  [40 %-50 %] 40 % (04/24 1732) Weight:  [213 lb 3 oz (96.7 kg)] 213 lb 3 oz (96.7 kg) (04/25 0500)  Filed Weights   12/11/17 0558 12/12/17 0556 12/13/17 0500  Weight: 202 lb 11.2 oz (91.9 kg) 205 lb (93 kg) 213 lb 3 oz (96.7 kg)    Weight change: 8 lb 3 oz (3.713 kg)   Hemodynamic parameters for last 24 hours: PAP: (7-30)/(1-10) 19/4 CO:  [4.4 L/min-7.9 L/min] 6.9 L/min CI:  [2 L/min/m2-3.6 L/min/m2] 3.1 L/min/m2  Intake/Output from previous day: 04/24 0701 - 04/25 0700 In: 0254 [P.O.:290; I.V.:3379; Blood:750; IV Piggyback:1250] Out: 2706 [Urine:2190; Blood:850; Chest Tube:640]  Intake/Output this shift: No intake/output data recorded.  Current Meds: Scheduled Meds: . acetaminophen  1,000 mg Oral Q6H   Or  . acetaminophen (TYLENOL) oral liquid 160 mg/5 mL  1,000 mg Per Tube Q6H  . aspirin EC  325 mg Oral Daily   Or  . aspirin  324 mg Per Tube Daily  . bisacodyl  10 mg Oral Daily   Or  .  bisacodyl  10 mg Rectal Daily  . docusate sodium  200 mg Oral Daily  . insulin regular  0-10 Units Intravenous TID WC  . metoprolol tartrate  12.5 mg Oral BID   Or  . metoprolol tartrate  12.5 mg Per Tube BID  . [START ON 12/14/2017] pantoprazole  40 mg Oral Daily  . rosuvastatin  20 mg Oral q1800  . sodium chloride flush  3 mL Intravenous Q12H   Continuous Infusions: . sodium chloride    . sodium chloride    . sodium chloride    . albumin human Stopped (12/12/17 2208)  . cefUROXime (ZINACEF)  IV Stopped (12/13/17 0714)  . dexmedetomidine (PRECEDEX) IV infusion Stopped (12/12/17 1540)  . DOPamine 3 mcg/kg/min (12/13/17 0600)  . famotidine (PEPCID) IV Stopped (12/12/17 1326)  . insulin (NOVOLIN-R) infusion 5.7 Units/hr (12/13/17 0736)  . lactated ringers    . lactated ringers 20 mL/hr at 12/13/17 0600  . lactated ringers Stopped (12/12/17 1900)  . milrinone 0.3 mcg/kg/min (12/13/17 2376)  . nitroGLYCERIN    . phenylephrine (NEO-SYNEPHRINE) Adult infusion Stopped (12/13/17 0445)   PRN Meds:.sodium chloride, albumin human, fentaNYL (SUBLIMAZE) injection, ketorolac, lactated ringers, metoprolol tartrate, midazolam, ondansetron (ZOFRAN) IV, sodium chloride flush, traMADol  General appearance: alert, cooperative, appears  stated age and no distress Neurologic: intact Heart: regular rate and rhythm, S1, S2 normal, no murmur, click, rub or gallop Lungs: diminished breath sounds bibasilar Abdomen: soft, non-tender; bowel sounds normal; no masses,  no organomegaly Extremities: extremities normal, atraumatic, no cyanosis or edema and Homans sign is negative, no sign of DVT Wound: Sternum stable dressing intact  Lab Results: CBC: Recent Labs    12/12/17 1846 12/12/17 1848 12/13/17 0427  WBC 15.9*  --  16.7*  HGB 14.4 13.9 13.9  HCT 42.5 41.0 40.6  PLT 97*  --  101*   BMET:  Recent Labs    12/12/17 0436  12/12/17 1848 12/13/17 0427  NA 140   < > 141 139  K 4.3   < > 3.8 4.4    CL 109   < > 107 111  CO2 20*  --   --  22  GLUCOSE 114*   < > 140* 137*  BUN 21*   < > 17 16  CREATININE 0.81   < > 0.60* 0.78  CALCIUM 9.1  --   --  8.3*   < > = values in this interval not displayed.    CMET: Lab Results  Component Value Date   WBC 16.7 (H) 12/13/2017   HGB 13.9 12/13/2017   HCT 40.6 12/13/2017   PLT 101 (L) 12/13/2017   GLUCOSE 137 (H) 12/13/2017   CHOL 138 11/07/2017   TRIG 144.0 11/07/2017   HDL 44.40 11/07/2017   LDLDIRECT 101.0 10/13/2014   LDLCALC 65 11/07/2017   ALT 18 12/11/2017   AST 19 12/11/2017   NA 139 12/13/2017   K 4.4 12/13/2017   CL 111 12/13/2017   CREATININE 0.78 12/13/2017   BUN 16 12/13/2017   CO2 22 12/13/2017   TSH 4.84 (H) 11/07/2017   PSA 0.59 11/07/2017   INR 1.41 12/12/2017   HGBA1C 6.0 11/07/2017   MICROALBUR 0.9 10/13/2014      PT/INR:  Recent Labs    12/12/17 1255  LABPROT 17.1*  INR 1.41   Radiology: Dg Chest Port 1 View  Result Date: 12/12/2017 CLINICAL DATA:  Status post coronary bypass grafting EXAM: PORTABLE CHEST 1 VIEW COMPARISON:  12/11/2017 FINDINGS: Cardiac shadow is stable. Postsurgical changes are now seen. Endotracheal tube, nasogastric catheter, mediastinal drain and left thoracostomy catheter are seen. No pneumothorax is noted. Mild bibasilar atelectatic changes are seen. Swan-Ganz catheter is noted in the pulmonary outflow tract. IMPRESSION: Mild bibasilar atelectasis. No evidence of pneumothorax. Tubes and lines as described. Electronically Signed   By: Inez Catalina M.D.   On: 12/12/2017 13:46     Assessment/Plan: S/P Procedure(s) (LRB): CORONARY ARTERY BYPASS GRAFTING (CABG) x3 using the right greater saphenous vein harvested endoscopically and the left internal mammary artery. LIMA to LAD, SEQ SVG to OM1 & OM2 (N/A) TRANSESOPHAGEAL ECHOCARDIOGRAM (TEE) (N/A) Mobilize Diuresis Diabetes control d/c tubes/lines See progression orders Renal function stable Expected Acute  Blood - loss  Anemia- continue to monitor      Phillip [name] 12/13/2017 7:36 AM

## 2017-12-13 NOTE — Progress Notes (Signed)
      [address]       [place] [address]             [phone]      Up in chair  BP 107/62   Pulse 74   Temp 98.5 F (36.9 C) (Oral)   Resp (!) 21   Ht 6\' 2"  (1.88 m)   Wt 213 lb 3 oz (96.7 kg)   SpO2 94%   BMI 27.37 kg/m   Intake/Output Summary (Last 24 hours) at 12/13/2017 1803 Last data filed at 12/13/2017 1500 Gross per 24 hour  Intake 1938.27 ml  Output 1265 ml  Net 673.27 ml  cratinine 1.20, K= 4.9 Hct= 34  Doing well POD # 1  Steven C. Roxan Hockey, MD [facility] [phone]

## 2017-12-13 NOTE — Op Note (Signed)
NAMEEMORY, GALLENTINE MEDICAL RECORD TD:[id number] ACCOUNT [id number] DATE OF BIRTH:[birth year] FACILITY: [facility] LOCATION: [facility] PHYSICIAN:Abella Shugart Maryruth Bun, MD  OPERATIVE REPORT  DATE OF PROCEDURE:  12/12/2017  PREOPERATIVE DIAGNOSIS:  Coronary occlusive disease with depressed left ventricular function, left main equivalent.  POSTOPERATIVE DIAGNOSIS:  Coronary occlusive disease with depressed left ventricular function, left main equivalent.  SURGICAL PROCEDURE:  Coronary artery bypass grafting x3 with the left internal mammary to the left anterior descending coronary artery, sequential reverse saphenous vein graft to the first obtuse marginal and distal circumflex with right thigh greater  saphenous endovein harvesting.  SURGEON:  Lanelle Bal, MD  FIRST ASSISTANT:  Jadene Pierini, [place]   BRIEF HISTORY:  Patient is a 77 year old male who presented with increasing symptoms of congestive heart failure with mild shortness of breath and pedal edema.  Cardiac stress test was performed demonstrating depressed LV function with ejection fraction  of 30-35%, area of anterior scar and ischemia.  Cardiac catheterization was performed by Dr. Saunders Revel, which demonstrated total occlusion of LAD with collateral filling and complex circumflex disease involving a large first obtuse marginal and distal  circumflex.  The right coronary artery was without significant hemodynamic disease.  Overall ejection fraction was approximately 40% with anterior hypokinesis.  Risks and options of surgery were discussed with the patient in detail.  With the patient's  left main equivalent, coronary artery bypass was recommended to him.  He agreed and signed informed consent.  DESCRIPTION OF PROCEDURE:  With Swan-Ganz and arterial line monitoring in place, the patient underwent general endotracheal anesthesia without incident.  TEE probe was placed by Dr.Germeroth and confirmed the cath findings of his ejection fraction with   anterior hypokinesis and apical akinesis without significant valvular disease.  The skin of the chest and legs was prepped with Betadine, draped in the usual sterile manner.  Appropriate timeout was performed and we proceeded with endoscopic vein  harvesting of the right greater saphenous vein.  The vein was of adequate quality and caliber.  Median sternotomy was performed.  The left internal mammary artery was dissected down to the pedicle graft.  The distal artery was divided and had good free  flow.  Pericardium was opened.  The overall ventricular function appeared preserved with the exception of apical hypokinesis and moderate biventricular enlargement.  The patient was systemically heparinized.  The ascending aorta was cannulated.  The  right atrium was cannulated.  An aortic root vent cardioplegia needle was introduced into the ascending aorta.  The patient was placed on cardiopulmonary bypass,  2.4 liters/minute/m2.  Sites of anastomosis were selected and dissected out of the  epicardium.  Patient's body temperature was cooled to 32 degrees.  Aortic crossclamp was applied and 800 mL of cold blood potassium cardioplegia was administered antegrade with diastolic arrest of the heart.  Myocardial septal temperature was monitored  throughout the crossclamp.  The heart was elevated and the first obtuse marginal vessel was opened.  It was a large vessel, approximately 2 mm in size.  Using a diamond type, side-to-side anastomosis was carried out with a segment of reverse saphenous  vein graft.  The distal extent of the same vein was then carried to the distal circumflex branch, which was slightly smaller, but did admit a 1.5 mm probe.  Using a running 7-0 Prolene, a distal anastomosis was performed.  Additional cold blood  cardioplegia was administered down the vein grafts.  Attention was then turned to the left anterior descending coronary artery and the midportion of  the vessel was opened.  The proximal  half of the vessel was highly calcified.  A 1.5 mm probe passed  distally using a running 8-0 Prolene.  The left internal mammary artery was anastomosed to the left anterior descending coronary artery.  With a crossclamp still in place, a single punch aortotomy was performed and the vein graft was anastomosed to the  ascending aorta.  The bulldog was removed from the mammary artery with proper rise in myocardial septal temperature.   The heart was allowed to passively fill and de-air and the aortic crossclamp was removed.  The total crossclamp time was 63 minutes.   The patient was started on low-dose milrinone and dopamine.  Body temperature was rewarmed to 37 degrees.  He was then ventilated and weaned from cardiopulmonary bypass without difficulty.  He remained hemodynamically stable.  He was decannulated in the  usual fashion.  Protamine sulfate was administered with the operative field hemostatic.  Atrial and ventricular pacing wires and graft markers were applied.  A left pleural tube and a Blake mediastinal drain were left in place.  The sternum was closed  with #6 stainless steel wire.  Fascia closed with interrupted 0 Vicryl, running 3-0 Vicryl in the subcutaneous tissue and a 3-0 subcuticular stitch in skin edges.  Dry dressings were applied.  Sponge and needle count was reported as correct at completion  of the procedure.  Total pump time was 96 minutes.  The patient did not require any blood products during the operative procedure.    He was transferred to the surgical intensive care unit for further postoperative care.  AN/NUANCE  D:12/13/2017 T:12/13/2017 JOB:[id number]

## 2017-12-14 ENCOUNTER — Inpatient Hospital Stay (HOSPITAL_COMMUNITY): Payer: PPO

## 2017-12-14 LAB — COOXEMETRY PANEL
Carboxyhemoglobin: 1.5 % (ref 0.5–1.5)
Methemoglobin: 1.5 % (ref 0.0–1.5)
O2 Saturation: 63.3 %
Total hemoglobin: 8.7 g/dL — ABNORMAL LOW (ref 12.0–16.0)

## 2017-12-14 LAB — BASIC METABOLIC PANEL
Anion gap: 12 (ref 5–15)
BUN: 31 mg/dL — ABNORMAL HIGH (ref 6–20)
CO2: 23 mmol/L (ref 22–32)
Calcium: 8.4 mg/dL — ABNORMAL LOW (ref 8.9–10.3)
Chloride: 100 mmol/L — ABNORMAL LOW (ref 101–111)
Creatinine, Ser: 1.14 mg/dL (ref 0.61–1.24)
GFR calc Af Amer: >60 mL/min (ref >60)
GFR calc non Af Amer: 60 mL/min — ABNORMAL LOW (ref >60)
Glucose, Bld: 148 mg/dL — ABNORMAL HIGH (ref 65–99)
Potassium: 4.6 mmol/L (ref 3.5–5.1)
Sodium: 135 mmol/L (ref 135–145)

## 2017-12-14 LAB — CBC
HCT: 35.6 % — ABNORMAL LOW (ref 39.0–52.0)
Hemoglobin: 11.9 g/dL — ABNORMAL LOW (ref 13.0–17.0)
MCH: 31.7 pg (ref 26.0–34.0)
MCHC: 33.4 g/dL (ref 30.0–36.0)
MCV: 94.9 fL (ref 78.0–100.0)
Platelets: 89 10*3/uL — ABNORMAL LOW (ref 150–400)
RBC: 3.75 MIL/uL — ABNORMAL LOW (ref 4.22–5.81)
RDW: 13.9 % (ref 11.5–15.5)
WBC: 16.4 10*3/uL — ABNORMAL HIGH (ref 4.0–10.5)

## 2017-12-14 LAB — GLUCOSE, CAPILLARY
GLUCOSE-CAPILLARY: 128 mg/dL — AB (ref 65–99)
GLUCOSE-CAPILLARY: 136 mg/dL — AB (ref 65–99)
GLUCOSE-CAPILLARY: 166 mg/dL — AB (ref 65–99)
Glucose-Capillary: 163 mg/dL — ABNORMAL HIGH (ref 65–99)
Glucose-Capillary: 153 mg/dL — ABNORMAL HIGH (ref 65–99)
Glucose-Capillary: 150 mg/dL — ABNORMAL HIGH (ref 65–99)

## 2017-12-14 MED ORDER — GUAIFENESIN ER 600 MG PO TB12: 600.0000 mg | ORAL_TABLET | Freq: Two times a day (BID) | ORAL | Status: DC | PRN

## 2017-12-14 MED ORDER — BISACODYL 5 MG PO TBEC
10.0000 mg | DELAYED_RELEASE_TABLET | Freq: Every day | ORAL | Status: DC | PRN
  Administered 2017-12-15 – 2017-12-16 (×2): 10 mg via ORAL
  Filled 2017-12-14 (×2): qty 2

## 2017-12-14 MED ORDER — ASPIRIN EC 325 MG PO TBEC
325.0000 mg | DELAYED_RELEASE_TABLET | Freq: Every day | ORAL | Status: DC
  Administered 2017-12-14 – 2017-12-18 (×5): 325 mg via ORAL
  Filled 2017-12-14 (×5): qty 1

## 2017-12-14 MED ORDER — ACETAMINOPHEN 325 MG PO TABS
650.0000 mg | ORAL_TABLET | Freq: Four times a day (QID) | ORAL | Status: DC | PRN
  Administered 2017-12-14 – 2017-12-18 (×3): 650 mg via ORAL
  Filled 2017-12-14 (×3): qty 2

## 2017-12-14 MED ORDER — SODIUM CHLORIDE 0.9% FLUSH
3.0000 mL | Freq: Two times a day (BID) | INTRAVENOUS | Status: DC
  Administered 2017-12-14: 3 mL via INTRAVENOUS
  Administered 2017-12-14: 10 mL via INTRAVENOUS
  Administered 2017-12-15 – 2017-12-17 (×3): 3 mL via INTRAVENOUS

## 2017-12-14 MED ORDER — OXYCODONE HCL 5 MG PO TABS: 5.0000 mg | ORAL_TABLET | ORAL | Status: DC | PRN

## 2017-12-14 MED ORDER — INSULIN ASPART 100 UNIT/ML ~~LOC~~ SOLN
0.0000 [IU] | Freq: Three times a day (TID) | SUBCUTANEOUS | Status: DC
  Administered 2017-12-14 (×2): 2 [IU] via SUBCUTANEOUS
  Administered 2017-12-14 – 2017-12-15 (×3): 4 [IU] via SUBCUTANEOUS
  Administered 2017-12-15 (×2): 2 [IU] via SUBCUTANEOUS
  Administered 2017-12-15: 4 [IU] via SUBCUTANEOUS
  Administered 2017-12-16 (×2): 2 [IU] via SUBCUTANEOUS
  Administered 2017-12-16: 4 [IU] via SUBCUTANEOUS
  Administered 2017-12-16: 2 [IU] via SUBCUTANEOUS
  Administered 2017-12-17: 4 [IU] via SUBCUTANEOUS
  Administered 2017-12-17: 2 [IU] via SUBCUTANEOUS

## 2017-12-14 MED ORDER — ONDANSETRON HCL 4 MG/2ML IJ SOLN
4.0000 mg | Freq: Four times a day (QID) | INTRAMUSCULAR | Status: DC | PRN
  Administered 2017-12-14: 4 mg via INTRAVENOUS

## 2017-12-14 MED ORDER — ONDANSETRON HCL 4 MG PO TABS
4.0000 mg | ORAL_TABLET | Freq: Four times a day (QID) | ORAL | Status: DC | PRN
  Administered 2017-12-14: 4 mg via ORAL
  Filled 2017-12-14: qty 1

## 2017-12-14 MED ORDER — DOCUSATE SODIUM 100 MG PO CAPS
200.0000 mg | ORAL_CAPSULE | Freq: Every day | ORAL | Status: DC
  Administered 2017-12-14 – 2017-12-18 (×5): 200 mg via ORAL
  Filled 2017-12-14 (×5): qty 2

## 2017-12-14 MED ORDER — AMIODARONE HCL IN DEXTROSE 360-4.14 MG/200ML-% IV SOLN
30.0000 mg/h | INTRAVENOUS | Status: DC
  Administered 2017-12-15 – 2017-12-17 (×6): 30 mg/h via INTRAVENOUS
  Filled 2017-12-14 (×5): qty 200

## 2017-12-14 MED ORDER — AMIODARONE LOAD VIA INFUSION
150.0000 mg | Freq: Once | INTRAVENOUS | Status: AC
  Administered 2017-12-14: 150 mg via INTRAVENOUS
  Filled 2017-12-14: qty 83.34

## 2017-12-14 MED ORDER — SODIUM CHLORIDE 0.9 % IV SOLN: 250.0000 mL | INTRAVENOUS | Status: DC | PRN

## 2017-12-14 MED ORDER — METOPROLOL TARTRATE 12.5 MG HALF TABLET
12.5000 mg | ORAL_TABLET | Freq: Two times a day (BID) | ORAL | Status: DC
  Administered 2017-12-14 – 2017-12-15 (×3): 12.5 mg via ORAL
  Filled 2017-12-14 (×3): qty 1

## 2017-12-14 MED ORDER — SODIUM CHLORIDE 0.9% FLUSH: 3.0000 mL | INTRAVENOUS | Status: DC | PRN

## 2017-12-14 MED ORDER — TRAMADOL HCL 50 MG PO TABS: 50.0000 mg | ORAL_TABLET | ORAL | Status: DC | PRN

## 2017-12-14 MED ORDER — MILRINONE LACTATE IN DEXTROSE 20-5 MG/100ML-% IV SOLN: 0.1250 ug/kg/min | INTRAVENOUS | Status: AC

## 2017-12-14 MED ORDER — MILRINONE LACTATE IN DEXTROSE 20-5 MG/100ML-% IV SOLN: 0.1250 ug/kg/min | INTRAVENOUS | Status: DC

## 2017-12-14 MED ORDER — BISACODYL 10 MG RE SUPP
10.0000 mg | Freq: Every day | RECTAL | Status: DC | PRN
  Administered 2017-12-17: 10 mg via RECTAL
  Filled 2017-12-14: qty 1

## 2017-12-14 MED ORDER — MOVING RIGHT ALONG BOOK
Freq: Once | Status: AC
  Administered 2017-12-14: 1
  Filled 2017-12-14: qty 1

## 2017-12-14 MED ORDER — AMIODARONE HCL IN DEXTROSE 360-4.14 MG/200ML-% IV SOLN
60.0000 mg/h | INTRAVENOUS | Status: AC
  Administered 2017-12-14 – 2017-12-15 (×2): 60 mg/h via INTRAVENOUS
  Filled 2017-12-14 (×2): qty 200

## 2017-12-14 MED FILL — Mannitol IV Soln 20%: INTRAVENOUS | Qty: 500 | Status: AC

## 2017-12-14 MED FILL — Sodium Bicarbonate IV Soln 8.4%: INTRAVENOUS | Qty: 50 | Status: AC

## 2017-12-14 MED FILL — Lidocaine HCl(Cardiac) IV PF Soln Pref Syr 100 MG/5ML (2%): INTRAVENOUS | Qty: 5 | Status: AC

## 2017-12-14 MED FILL — Electrolyte-R (PH 7.4) Solution: INTRAVENOUS | Qty: 3000 | Status: AC

## 2017-12-14 NOTE — Plan of Care (Signed)
Pt hemodynamics WNL. Pt on 2l O2. UOP WNL.

## 2017-12-14 NOTE — Discharge Summary (Signed)
[address]       [place] [address]             [phone]      Physician Discharge Summary  Patient ID: Rhylan Kagel MRN: [id number] DOB/AGE: 1941-06-28 77 y.o.  Admit date: 12/10/2017 Discharge date: 12/18/2017  Admission Diagnoses: Patient Active Problem List   Diagnosis Date Noted  . Atypical chest pain 12/10/2017  . Cardiomyopathy ([place]) 12/10/2017  . Abnormal stress test 12/10/2017  . Coronary artery disease involving native coronary artery of native heart without angina pectoris 06/24/2007    Discharge Diagnoses:  Principal Problem:   Atypical chest pain Active Problems:   Cardiomyopathy ([place])   Abnormal stress test   Unstable angina ([facility])   S/P CABG x 3   Discharged Condition: good  HPI:  Patient is a 77 year old male who presented with increasing symptoms of congestive heart failure with mild shortness of breath and pedal edema.  Cardiac stress test was performed demonstrating depressed LV function with ejection fraction  of 30-35%, area of anterior scar and ischemia.  Cardiac catheterization was performed by Dr. Saunders Revel, which demonstrated total occlusion of LAD with collateral filling and complex circumflex disease involving a large first obtuse marginal and distal  circumflex.  The right coronary artery was without significant hemodynamic disease.  Overall ejection fraction was approximately 40% with anterior hypokinesis.  Risks and options of surgery were discussed with the patient in detail.  With the patient's  left main equivalent, coronary artery bypass was recommended to him.  He agreed and signed informed consent.   Hospital Course:   On 12/12/2017 Mr. Kasparek underwent a coronary bypass grafting x3 by Dr. Servando Snare.  He tolerated the procedure well and was transferred to the surgical ICU.  He was extubated in a timely manner.  Postop day 1 the patient was awake, alert, and neurologically intact.  We initiated a diuretic regimen for  fluid overload.  We began to mobilize the patient.  We discontinued chest tubes and lines.  We initiated a sliding scale insulin and Levemir for glucose control.  His renal function remained stable.  Postop day 2 he continued to progress. We were weaning supplemental oxygen as tolerated.  His creatinine continued to trend down.  His hemoglobin and hematocrit remained stable.  He was maintaining NSR and was felt medically stable for transfer to the telemetry unit on 12/14/2017.  The patient developed Rapid Atrial Fibrillation.  He was treated with IV Amiodarone and successfully converted to NSR.  His pacing wires were removed without difficulty.  His pacing wires were removed prior to discharge.  He was hyperkalemic and his potassium supplementation was discontinued.  He was ambulating independently.  His pain is controlled.  He is medically stable for discharge home today.      Consults: None  Significant Diagnostic Studies:   CLINICAL DATA:  Chest tube.  CABG  EXAM: PORTABLE CHEST 1 VIEW  COMPARISON:  12/13/2017  FINDINGS: Swan-Ganz catheter removed. Left chest tube in place. No pneumothorax identified on today's study  Bibasilar atelectasis unchanged.  Negative for edema or effusion  IMPRESSION: No pneumothorax on the left on today's study  Bibasilar atelectasis unchanged.   Electronically Signed   By: Franchot Gallo M.D.   On: 12/14/2017 09:08   Treatments:   DATE OF PROCEDURE:  12/12/2017  OPERATIVE REPORT PREOPERATIVE DIAGNOSIS:  Coronary occlusive disease with depressed left ventricular function, left main equivalent.  POSTOPERATIVE DIAGNOSIS:  Coronary occlusive disease  with depressed left ventricular function, left main equivalent.  SURGICAL PROCEDURE:  Coronary artery bypass grafting x3 with the left internal mammary to the left anterior descending coronary artery, sequential reverse saphenous vein graft to the first obtuse marginal and distal circumflex with  right thigh greater  saphenous endovein harvesting.  SURGEON:  Lanelle Bal, MD  FIRST ASSISTANT: Jadene Pierini, [place]   Discharge Exam: Blood pressure (!) 148/81, pulse 100, temperature 97.9 F (36.6 C), temperature source Oral, resp. rate (!) 23, height 6\' 2"  (1.88 m), weight 214 lb 12.8 oz (97.4 kg), SpO2 94 %.    General appearance: alert, cooperative and no distress Heart: regular rate and rhythm Lungs: clear to auscultation bilaterally Abdomen: soft, non-tender; bowel sounds normal; no masses,  no organomegaly Extremities: edema trace-1+ Wound: clean and dry  Disposition: Discharge disposition: 01-Home or Self Care  Discharge Medications:   Discharge Instructions    Amb Referral to Cardiac Rehabilitation   Complete by:  As directed    Diagnosis:  CABG   CABG X ___:  2     Allergies as of 12/18/2017      Reactions   Ace Inhibitors Swelling, Other (See Comments)   Possible angioedema (upper lip swelling)   Morphine And Related Swelling   SWELLING REACTION UNSPECIFIED  [severity rated per PMH, 12/11/2017]      Medication List    STOP taking these medications   amLODipine 10 MG tablet Commonly known as:  NORVASC   aspirin 81 MG tablet Replaced by:  aspirin 325 MG EC tablet   ibuprofen 200 MG tablet Commonly known as:  ADVIL,MOTRIN     TAKE these medications   acetaminophen 325 MG tablet Commonly known as:  TYLENOL Take 2 tablets (650 mg total) by mouth every 6 (six) hours as needed for mild pain.   amiodarone 400 MG tablet Commonly known as:  PACERONE Take 1 tablet (400 mg total) by mouth 2 (two) times daily. X 7 days, then decrease to 400 mg daily   aspirin 325 MG EC tablet Take 1 tablet (325 mg total) by mouth daily. Replaces:  aspirin 81 MG tablet   carvedilol 3.125 MG tablet Commonly known as:  COREG Take 1 tablet (3.125 mg total) by mouth 2 (two) times daily with a meal.   furosemide 40 MG tablet Commonly known as:  LASIX Take 1 tablet (40  mg total) by mouth daily for 7 days. Start taking on:  12/19/2017   Magnesium 250 MG Tabs Take 500 mg by mouth daily.   MELATONIN PO Take 10 mg by mouth at bedtime.   multivitamin tablet Take 1 tablet by mouth daily.   MYRBETRIQ 25 MG Tb24 tablet Generic drug:  mirabegron ER Take 25 mg by mouth daily.   POTASSIUM GLUCONATE PO Take 198 mg by mouth daily.   rosuvastatin 20 MG tablet Commonly known as:  CRESTOR Take 1 tablet (20 mg total) by mouth daily.   STOOL SOFTENER 100 MG capsule Generic drug:  Docusate Sodium Take 350 mg by mouth daily.   SUPER B COMPLEX PO Take 1 tablet by mouth daily.   traMADol 50 MG tablet Commonly known as:  ULTRAM Take 1 tablet (50 mg total) by mouth every 4 (four) hours as needed for moderate pain or severe pain.   TRIPLE OMEGA-3-6-9 PO Take 1 capsule by mouth daily.   vitamin C 500 MG tablet Commonly known as:  ASCORBIC ACID Take 1,000 mg by mouth daily.   Vitamin D 2000 units  Caps Take 2,000 Units by mouth daily.   vitamin E 400 UNIT capsule Take 400 Units by mouth daily.   zaleplon 10 MG capsule Commonly known as:  SONATA Take 1 capsule (10 mg total) by mouth as needed for sleep. What changed:  when to take this      Follow-up Information    [name], MD. Call in 1 day(s).   Specialty:  Family Medicine Contact information: [place] [address] [phone]        [name], MD Follow up on 01/17/2018.   Specialty:  Cardiology Why:  10:20 am Contact information: [place] [address] [phone]          The patient has been discharged on:   1.Beta Blocker:  Yes [ x  ]                              No   [   ]                              If No, reason:  2.Ace Inhibitor/ARB: Yes [   ]                                     No  [  x  ]                                     If No, reason: titration of BB, allergic  3.Statin:   Yes [  x ]                   No  [   ]                  If No, reason:  4.Ecasa:  Yes  [x   ]                  No   [   ]                  If No, reason:   Signed: Leighana Neyman 12/18/2017, 7:46 AM

## 2017-12-14 NOTE — Progress Notes (Signed)
Patient ID: Phillip [name], male   DOB: 08-02-1941, 77 y.o.   MRN: [id number] EVENING ROUNDS NOTE :     [address]       [place] [address]             [phone]                 2 Days Post-Op Procedure(s) (LRB): CORONARY ARTERY BYPASS GRAFTING (CABG) x3 using the right greater saphenous vein harvested endoscopically and the left internal mammary artery. LIMA to LAD, SEQ SVG to OM1 & OM2 (N/A) TRANSESOPHAGEAL ECHOCARDIOGRAM (TEE) (N/A)  Total Length of Stay:  LOS: 4 days  BP 124/77   Pulse 80   Temp 98.7 F (37.1 C) (Oral)   Resp (!) 24   Ht 6\' 2"  (1.88 m)   Wt 218 lb 11.1 oz (99.2 kg)   SpO2 90%   BMI 28.08 kg/m   .Intake/Output      04/25 0701 - 04/26 0700 04/26 0701 - 04/27 0700   P.O. 240 711   I.V. (mL/kg) 851.3 (8.6) 23.3 (0.2)   Blood     IV Piggyback 100    Total Intake(mL/kg) 1191.3 (12) 734.3 (7.4)   Urine (mL/kg/hr) 1075 (0.5) 400 (0.4)   Blood     Chest Tube 250 60   Total Output 1325 460   Net -133.7 +274.3          . sodium chloride       Lab Results  Component Value Date   WBC 16.4 (H) 12/14/2017   HGB 11.9 (L) 12/14/2017   HCT 35.6 (L) 12/14/2017   PLT 89 (L) 12/14/2017   GLUCOSE 148 (H) 12/14/2017   CHOL 138 11/07/2017   TRIG 144.0 11/07/2017   HDL 44.40 11/07/2017   LDLDIRECT 101.0 10/13/2014   LDLCALC 65 11/07/2017   ALT 18 12/11/2017   AST 19 12/11/2017   NA 135 12/14/2017   K 4.6 12/14/2017   CL 100 (L) 12/14/2017   CREATININE 1.14 12/14/2017   BUN 31 (H) 12/14/2017   CO2 23 12/14/2017   TSH 4.84 (H) 11/07/2017   PSA 0.59 11/07/2017   INR 1.41 12/12/2017   HGBA1C 6.0 11/07/2017   MICROALBUR 0.9 10/13/2014   Transfer to floor     [name] MD  Beeper [phone] Office [phone] 12/14/2017 6:13 PM

## 2017-12-14 NOTE — Progress Notes (Signed)
Pt had incontinent episode. Pt then went into AFIB with HRs in the 140s-170s. EKG confirmed afib RVR. Pt upset about having the accident. Reassured him it was ok. Pt cleaned up. Vital signs taken. Pt given night time scheduled meds including his metoprolol 12.5 mg. MD Servando Snare called about pt conditions and verbally ordered an amiodarone infusion order set. Infusion administered. Will continue to monitor patient. Pr resting in bed with no signs of distress.

## 2017-12-14 NOTE — Progress Notes (Signed)
  Amiodarone Drug - Drug Interaction Consult Note  Recommendations: Monitor EKG if frequent zofran use  Amiodarone is metabolized by the cytochrome P450 system and therefore has the potential to cause many drug interactions. Amiodarone has an average plasma half-life of 50 days (range 20 to 100 days).   There is potential for drug interactions to occur several weeks or months after stopping treatment and the onset of drug interactions may be slow after initiating amiodarone.   [x]  Statins: Increased risk of myopathy. Simvastatin- restrict dose to 20mg  daily. Other statins: counsel patients to report any muscle pain or weakness immediately.  []  Anticoagulants: Amiodarone can increase anticoagulant effect. Consider warfarin dose reduction. Patients should be monitored closely and the dose of anticoagulant altered accordingly, remembering that amiodarone levels take several weeks to stabilize.  []  Antiepileptics: Amiodarone can increase plasma concentration of phenytoin, the dose should be reduced. Note that small changes in phenytoin dose can result in large changes in levels. Monitor patient and counsel on signs of toxicity.  [x]  Beta blockers: increased risk of bradycardia, AV block and myocardial depression. Sotalol - avoid concomitant use.  []   Calcium channel blockers (diltiazem and verapamil): increased risk of bradycardia, AV block and myocardial depression.  []   Cyclosporine: Amiodarone increases levels of cyclosporine. Reduced dose of cyclosporine is recommended.  []  Digoxin dose should be halved when amiodarone is started.  []  Diuretics: increased risk of cardiotoxicity if hypokalemia occurs.  []  Oral hypoglycemic agents (glyburide, glipizide, glimepiride): increased risk of hypoglycemia. Patient's glucose levels should be monitored closely when initiating amiodarone therapy.   [x]  Drugs that prolong the QT interval:  Torsades de pointes risk may be increased with concurrent use -  avoid if possible.  Monitor QTc, also keep magnesium/potassium WNL if concurrent therapy can't be avoided. Marland Kitchen Antibiotics: e.g. fluoroquinolones, erythromycin. . Antiarrhythmics: e.g. quinidine, procainamide, disopyramide, sotalol. . Antipsychotics: e.g. phenothiazines, haloperidol.  . Lithium, tricyclic antidepressants, and methadone. Thank You,  Phillip [name]  12/14/2017 10:07 PM

## 2017-12-14 NOTE — Progress Notes (Signed)
Patient ID: Phillip [name], male   DOB: [birth year], 77 y.o.   MRN: [id number] [facility] DAILY ICU PROGRESS NOTE                   [address]            [place] [address]          [phone]   2 Days Post-Op Procedure(s) (LRB): CORONARY ARTERY BYPASS GRAFTING (CABG) x3 using the right greater saphenous vein harvested endoscopically and the left internal mammary artery. LIMA to LAD, SEQ SVG to OM1 & OM2 (N/A) TRANSESOPHAGEAL ECHOCARDIOGRAM (TEE) (N/A)  Total Length of Stay:  LOS: 4 days   Subjective: Patient awake alert neurologically intact, up in chair this morning  Objective: Vital signs in last 24 hours: Temp:  [98 F (36.7 C)-99.1 F (37.3 C)] 98.8 F (37.1 C) (04/26 0400) Pulse Rate:  [63-99] 72 (04/26 0600) Cardiac Rhythm: Normal sinus rhythm (04/26 0000) Resp:  [8-33] 20 (04/26 0600) BP: (89-165)/(52-128) 135/73 (04/26 0600) SpO2:  [88 %-96 %] 92 % (04/26 0600) Arterial Line BP: (71-172)/(34-73) 144/51 (04/25 1500) Weight:  [218 lb 11.1 oz (99.2 kg)] 218 lb 11.1 oz (99.2 kg) (04/26 0600)  Filed Weights   12/12/17 0556 12/13/17 0500 12/14/17 0600  Weight: 205 lb (93 kg) 213 lb 3 oz (96.7 kg) 218 lb 11.1 oz (99.2 kg)    Weight change: 5 lb 8.2 oz (2.5 kg)   Hemodynamic parameters for last 24 hours: PAP: (23-27)/(14-15) 23/15 CO:  [4.5 L/min] 4.5 L/min CI:  [2.1 L/min/m2] 2.1 L/min/m2  Intake/Output from previous day: 04/25 0701 - 04/26 0700 In: 1182.9 [P.O.:240; I.V.:842.9; IV Piggyback:100] Out: 1325 [Urine:1075; Chest Tube:250]  Intake/Output this shift: No intake/output data recorded.  Current Meds: Scheduled Meds: . acetaminophen  1,000 mg Oral Q6H   Or  . acetaminophen (TYLENOL) oral liquid 160 mg/5 mL  1,000 mg Per Tube Q6H  . aspirin EC  325 mg Oral Daily   Or  . aspirin  324 mg Per Tube Daily  . bisacodyl  10 mg Oral Daily   Or  . bisacodyl  10 mg Rectal Daily  . docusate sodium  200 mg Oral Daily  . enoxaparin (LOVENOX) injection   30 mg Subcutaneous QHS  . feeding supplement (GLUCERNA SHAKE)  711 mL Oral QID  . insulin aspart  0-24 Units Subcutaneous Q4H  . insulin detemir  15 Units Subcutaneous Daily  . insulin regular  0-10 Units Intravenous TID WC  . metoprolol tartrate  12.5 mg Oral BID   Or  . metoprolol tartrate  12.5 mg Per Tube BID  . pantoprazole  40 mg Oral Daily  . rosuvastatin  20 mg Oral q1800  . sodium chloride flush  3 mL Intravenous Q12H   Continuous Infusions: . sodium chloride    . sodium chloride    . sodium chloride    . cefUROXime (ZINACEF)  IV Stopped (12/13/17 2100)  . dexmedetomidine (PRECEDEX) IV infusion Stopped (12/12/17 1540)  . insulin (NOVOLIN-R) infusion Stopped (12/13/17 1405)  . lactated ringers    . lactated ringers 20 mL/hr at 12/14/17 0600  . lactated ringers Stopped (12/12/17 1900)  . milrinone 0.3 mcg/kg/min (12/14/17 0600)  . nitroGLYCERIN    . phenylephrine (NEO-SYNEPHRINE) Adult infusion Stopped (12/13/17 0445)   PRN Meds:.sodium chloride, fentaNYL (SUBLIMAZE) injection, lactated ringers, metoprolol tartrate, midazolam, ondansetron (ZOFRAN) IV, sodium chloride flush, traMADol  General appearance: alert, cooperative, appears stated age and no distress Neurologic:  intact Heart: regular rate and rhythm, S1, S2 normal, no murmur, click, rub or gallop Lungs: diminished breath sounds bibasilar Abdomen: soft, non-tender; bowel sounds normal; no masses,  no organomegaly Extremities: extremities normal, atraumatic, no cyanosis or edema and Homans sign is negative, no sign of DVT Wound: Sternum stable dressing intact  Lab Results: CBC: Recent Labs    12/13/17 1556  12/13/17 1621 12/14/17 0448  WBC 19.4*  --   --  16.4*  HGB 12.6*   < > 11.6* 11.9*  HCT 36.8*   < > 34.0* 35.6*  PLT 99*  --   --  89*   < > = values in this interval not displayed.   BMET:  Recent Labs    12/13/17 0427  12/13/17 1621 12/14/17 0448  NA 139   < > 137 135  K 4.4   < > 4.9 4.6    CL 111   < > 103 100*  CO2 22  --   --  23  GLUCOSE 137*   < > 193* 148*  BUN 16   < > 38* 31*  CREATININE 0.78   < > 1.30* 1.14  CALCIUM 8.3*  --   --  8.4*   < > = values in this interval not displayed.    CMET: Lab Results  Component Value Date   WBC 16.4 (H) 12/14/2017   HGB 11.9 (L) 12/14/2017   HCT 35.6 (L) 12/14/2017   PLT 89 (L) 12/14/2017   GLUCOSE 148 (H) 12/14/2017   CHOL 138 11/07/2017   TRIG 144.0 11/07/2017   HDL 44.40 11/07/2017   LDLDIRECT 101.0 10/13/2014   LDLCALC 65 11/07/2017   ALT 18 12/11/2017   AST 19 12/11/2017   NA 135 12/14/2017   K 4.6 12/14/2017   CL 100 (L) 12/14/2017   CREATININE 1.14 12/14/2017   BUN 31 (H) 12/14/2017   CO2 23 12/14/2017   TSH 4.84 (H) 11/07/2017   PSA 0.59 11/07/2017   INR 1.41 12/12/2017   HGBA1C 6.0 11/07/2017   MICROALBUR 0.9 10/13/2014      PT/INR:  Recent Labs    12/12/17 1255  LABPROT 17.1*  INR 1.41   Radiology: No results found.   Assessment/Plan: S/P Procedure(s) (LRB): CORONARY ARTERY BYPASS GRAFTING (CABG) x3 using the right greater saphenous vein harvested endoscopically and the left internal mammary artery. LIMA to LAD, SEQ SVG to OM1 & OM2 (N/A) TRANSESOPHAGEAL ECHOCARDIOGRAM (TEE) (N/A) Mobilize d/c tubes/lines See progression orders     [name] 12/14/2017 7:11 AM

## 2017-12-14 NOTE — Progress Notes (Signed)
Patient transferred to 4E.  Nego RN at bedside with patient.

## 2017-12-14 NOTE — Discharge Instructions (Signed)

## 2017-12-14 NOTE — Progress Notes (Signed)
RIJ introducer pulled with patient after patient told to take a deep breath.  Patient experienced some pain with pulling of line, and some resistance was met.  Patient not in respiratory distress and had hardly any bleeding.  Pressure held and pressure dressing applied.  Upon reassessment, no hematoma formation.  Patient given Tylenol for pain.  Patient continues to have no respiratory distress and is satting 92 on RA.

## 2017-12-15 ENCOUNTER — Inpatient Hospital Stay (HOSPITAL_COMMUNITY): Payer: PPO

## 2017-12-15 LAB — BASIC METABOLIC PANEL
Anion gap: 7 (ref 5–15)
BUN: 24 mg/dL — ABNORMAL HIGH (ref 6–20)
CO2: 26 mmol/L (ref 22–32)
Calcium: 8.6 mg/dL — ABNORMAL LOW (ref 8.9–10.3)
Chloride: 104 mmol/L (ref 101–111)
Creatinine, Ser: 0.87 mg/dL (ref 0.61–1.24)
GFR calc Af Amer: >60 mL/min (ref >60)
GFR calc non Af Amer: >60 mL/min (ref >60)
Glucose, Bld: 141 mg/dL — ABNORMAL HIGH (ref 65–99)
Potassium: 4.6 mmol/L (ref 3.5–5.1)
Sodium: 137 mmol/L (ref 135–145)

## 2017-12-15 LAB — GLUCOSE, CAPILLARY
GLUCOSE-CAPILLARY: 149 mg/dL — AB (ref 65–99)
Glucose-Capillary: 156 mg/dL — ABNORMAL HIGH (ref 65–99)
Glucose-Capillary: 172 mg/dL — ABNORMAL HIGH (ref 65–99)
Glucose-Capillary: 167 mg/dL — ABNORMAL HIGH (ref 65–99)

## 2017-12-15 LAB — CBC
HCT: 37.9 % — ABNORMAL LOW (ref 39.0–52.0)
Hemoglobin: 12.4 g/dL — ABNORMAL LOW (ref 13.0–17.0)
MCH: 31.3 pg (ref 26.0–34.0)
MCHC: 32.7 g/dL (ref 30.0–36.0)
MCV: 95.7 fL (ref 78.0–100.0)
Platelets: 91 10*3/uL — ABNORMAL LOW (ref 150–400)
RBC: 3.96 MIL/uL — ABNORMAL LOW (ref 4.22–5.81)
RDW: 14 % (ref 11.5–15.5)
WBC: 12.7 10*3/uL — ABNORMAL HIGH (ref 4.0–10.5)

## 2017-12-15 LAB — MAGNESIUM: Magnesium: 2.1 mg/dL (ref 1.7–2.4)

## 2017-12-15 MED ORDER — FUROSEMIDE 40 MG PO TABS
40.0000 mg | ORAL_TABLET | Freq: Every day | ORAL | Status: DC
  Administered 2017-12-15 – 2017-12-18 (×4): 40 mg via ORAL
  Filled 2017-12-15 (×4): qty 1

## 2017-12-15 MED ORDER — METOPROLOL TARTRATE 25 MG PO TABS
25.0000 mg | ORAL_TABLET | Freq: Two times a day (BID) | ORAL | Status: DC
  Administered 2017-12-15 – 2017-12-18 (×6): 25 mg via ORAL
  Filled 2017-12-15 (×6): qty 1

## 2017-12-15 MED ORDER — ONDANSETRON HCL 4 MG/2ML IJ SOLN
4.0000 mg | Freq: Four times a day (QID) | INTRAMUSCULAR | Status: DC
  Administered 2017-12-15 – 2017-12-18 (×12): 4 mg via INTRAVENOUS
  Filled 2017-12-15 (×12): qty 2

## 2017-12-15 NOTE — Progress Notes (Addendum)
[address]       [place] [address]             [phone]      3 Days Post-Op Procedure(s) (LRB): CORONARY ARTERY BYPASS GRAFTING (CABG) x3 using the right greater saphenous vein harvested endoscopically and the left internal mammary artery. LIMA to LAD, SEQ SVG to OM1 & OM2 (N/A) TRANSESOPHAGEAL ECHOCARDIOGRAM (TEE) (N/A) Subjective: Feels a little nauseous this morning. On a liquid diet. There was some issues with his catheter and he is now on a condom cath with success.   Objective: Vital signs in last 24 hours: Temp:  [97.4 F (36.3 C)-98.7 F (37.1 C)] 97.4 F (36.3 C) (04/27 1118) Pulse Rate:  [49-125] 56 (04/27 1118) Cardiac Rhythm: Normal sinus rhythm (04/27 0745) Resp:  [18-60] 26 (04/27 1118) BP: (120-160)/(65-108) 152/108 (04/27 1118) SpO2:  [90 %-96 %] 96 % (04/27 1118) Weight:  [217 lb 6.4 oz (98.6 kg)] 217 lb 6.4 oz (98.6 kg) (04/27 0416)     Intake/Output from previous day: 04/26 0701 - 04/27 0700 In: 1069.9 [P.O.:711; I.V.:358.9] Out: 1310 [Urine:1250; Chest Tube:60] Intake/Output this shift: No intake/output data recorded.  General appearance: alert, cooperative and no distress Heart: irregularly irregular rhythm Lungs: clear to auscultation bilaterally Abdomen: soft, non-tender; bowel sounds normal; no masses,  no organomegaly Extremities: 1-2+ pitting pedal edema R > L Wound: Sternal incision is c/d/i. Right EVH site is c/d/i  Lab Results: Recent Labs    12/14/17 0448 12/15/17 0225  WBC 16.4* 12.7*  HGB 11.9* 12.4*  HCT 35.6* 37.9*  PLT 89* 91*   BMET:  Recent Labs    12/14/17 0448 12/15/17 0225  NA 135 137  K 4.6 4.6  CL 100* 104  CO2 23 26  GLUCOSE 148* 141*  BUN 31* 24*  CREATININE 1.14 0.87  CALCIUM 8.4* 8.6*    PT/INR:  Recent Labs    12/12/17 1255  LABPROT 17.1*  INR 1.41   ABG    Component Value Date/Time   PHART 7.365 12/12/2017 1852   HCO3 21.1 12/12/2017 1852   TCO2 23 12/13/2017  1621   ACIDBASEDEF 4.0 (H) 12/12/2017 1852   O2SAT 63.3 12/14/2017 1010   CBG (last 3)  Recent Labs    12/14/17 2126 12/15/17 0623 12/15/17 1116  GLUCAP 166* 156* 172*    Assessment/Plan: S/P Procedure(s) (LRB): CORONARY ARTERY BYPASS GRAFTING (CABG) x3 using the right greater saphenous vein harvested endoscopically and the left internal mammary artery. LIMA to LAD, SEQ SVG to OM1 & OM2 (N/A) TRANSESOPHAGEAL ECHOCARDIOGRAM (TEE) (N/A)  1. CV- remains in atrial fibrillation rate 110s. On IV Amio and Lopressor. Increased Lopressor to 25mg  BID. Also on ASA and statin. 2. Pulm-CXR today shows bilateral pleural effusions with basilar atelectasis. Tolerating room air with good saturation. Encouraged incentive spirometer.  3. Renal- creatinine 0.87, electrolytes okay. Will order a mag. Remains about 15 lbs fluid overloaded. Could probably use some lasix. Will order 40mg  daily.  4. H and H stable 5. Endo-blood glucose well controlled.   Plan: Increase lopressor. Work on rhythm control. Ambulate in the halls. Encouraged IS/ pulm toilet. Will order Lasix 40mg  daily for fluid overload. Scheduled Zofran for nausea.    LOS: 5 days    Elgie Collard 12/15/2017  Still in afib , rate slowed on beta blocker and iv Cordarone  I have seen and examined Phillip [name] and agree with the above assessment  and plan.  Percell Miller  Maryruth Bun MD Beeper [phone] Office [phone] 12/15/2017 12:24 PM

## 2017-12-15 NOTE — Progress Notes (Signed)
CARDIAC REHAB PHASE I   PRE:  Rate/Rhythm: 98 A Fib   BP:  Sitting: 154/101, Recheck 139/87      SaO2: 94% RA   MODE:  Ambulation: 500 ft   POST:  Rate/Rhythm: 128 A Fib   BP:  Sitting: 171/10, Recheck 153/101      SaO2: 95% RA  Pt had been feeling nauseous earlier. Pt stated he felt fine better and wanted to walk. Pt ambulated 500 ft independently. Pt managed IV pump during walk. Pt denied any complaints of CP, SOB, being nauseous or dizziness during walk. Pt returned to bed per pt request. BP was elevated. Recheck of BP was still elevated. Call bell within reach. Discussed cardiac rehab with pt. Will send referral to [facility] per pt's request. During writing the note, pt's wife came out of room, informed me pt had vomited.  Assisted wife with clean up. Pt stated he wasn't feeling great during walk, even though pt denied any complaints while walking. Pt stated he felt fine after he was done vomiting. RN notified.   [id number]  Carma Lair MS, ACSM CEP  3:11 PM 12/15/2017

## 2017-12-16 LAB — GLUCOSE, CAPILLARY
GLUCOSE-CAPILLARY: 170 mg/dL — AB (ref 65–99)
GLUCOSE-CAPILLARY: 136 mg/dL — AB (ref 65–99)
GLUCOSE-CAPILLARY: 142 mg/dL — AB (ref 65–99)
Glucose-Capillary: 132 mg/dL — ABNORMAL HIGH (ref 65–99)

## 2017-12-16 NOTE — Progress Notes (Addendum)
Pt ambulated whole unit with RW, steady gait, no complaints.  Maintained SR 60-70's.  To recliner after walk.  BP 169/96 immediatly after.  Recheck after 3 mins sitting BP 174/93. After mins sitting BP 151/85.  Pt unsymptomatic resting comfortably with wife in room.

## 2017-12-16 NOTE — Progress Notes (Addendum)
[address]       [place] [address]             [phone]      4 Days Post-Op Procedure(s) (LRB): CORONARY ARTERY BYPASS GRAFTING (CABG) x3 using the right greater saphenous vein harvested endoscopically and the left internal mammary artery. LIMA to LAD, SEQ SVG to OM1 & OM2 (N/A) TRANSESOPHAGEAL ECHOCARDIOGRAM (TEE) (N/A) Subjective: Nausea has improved. Tolerating liquids-previous tonsil cancer with radiation and might be able to advance to yogert/fruit.   Objective: Vital signs in last 24 hours: Temp:  [97.4 F (36.3 C)-99.6 F (37.6 C)] 98.2 F (36.8 C) (04/28 0429) Pulse Rate:  [56-106] 103 (04/28 0647) Cardiac Rhythm: Atrial fibrillation (04/28 0700) Resp:  [22-28] 24 (04/28 0647) BP: (125-152)/(90-108) 144/90 (04/28 0429) SpO2:  [91 %-96 %] 95 % (04/28 0647) Weight:  [215 lb 9.6 oz (97.8 kg)] 215 lb 9.6 oz (97.8 kg) (04/28 0647)     Intake/Output from previous day: 04/27 0701 - 04/28 0700 In: 377.7 [I.V.:377.7] Out: 550 [Urine:550] Intake/Output this shift: No intake/output data recorded.  General appearance: alert, cooperative and no distress Heart: irregularly irregular rhythm Lungs: clear to auscultation bilaterally Abdomen: soft, non-tender; bowel sounds normal; no masses,  no organomegaly Extremities: 1-2+pitting pedal edema Wound: clean and dry  Lab Results: Recent Labs    12/14/17 0448 12/15/17 0225  WBC 16.4* 12.7*  HGB 11.9* 12.4*  HCT 35.6* 37.9*  PLT 89* 91*   BMET:  Recent Labs    12/14/17 0448 12/15/17 0225  NA 135 137  K 4.6 4.6  CL 100* 104  CO2 23 26  GLUCOSE 148* 141*  BUN 31* 24*  CREATININE 1.14 0.87  CALCIUM 8.4* 8.6*    PT/INR: No results for input(s): LABPROT, INR in the last 72 hours. ABG    Component Value Date/Time   PHART 7.365 12/12/2017 1852   HCO3 21.1 12/12/2017 1852   TCO2 23 12/13/2017 1621   ACIDBASEDEF 4.0 (H) 12/12/2017 1852   O2SAT 63.3 12/14/2017 1010   CBG (last 3)    Recent Labs    12/15/17 1635 12/15/17 2147 12/16/17 0628  GLUCAP 167* 149* 170*    Assessment/Plan: S/P Procedure(s) (LRB): CORONARY ARTERY BYPASS GRAFTING (CABG) x3 using the right greater saphenous vein harvested endoscopically and the left internal mammary artery. LIMA to LAD, SEQ SVG to OM1 & OM2 (N/A) TRANSESOPHAGEAL ECHOCARDIOGRAM (TEE) (N/A)  1. CV- remains in atrial fibrillation rate now 90s-100bpm. Hx of brady in chart. Remains on IV Amio and Lopressor 25mg  BID. Continue ASA and statin. Magnesium was 2.1 and potassium 4.6. Will discuss the possible need for anticoagulation if we cannot convert to NSR. EPW remain.  2. Pulm- tolerating room air with good oxygen saturation 3. Renal-creatinine 0.87, started Lasix 40mg  daily yesterday. Down 2 lbs. Continue.  4. H and H stable 5. Endo-blood glucose well controlled 6. 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  7. Nausea- continue scheduled zofran, hx of tonsil CA with radiation. Tolerating liquids.   Plan: Continue diuretic regimen. Continue Amio and Lopressor for rate control. Remains in atrial fibrillation. May need to get cardiology on board or consider anticoagulation. Nausea has improved.    LOS: 6 days    Elgie Collard 12/16/2017  Discussed with cardiology Now holding sinus rhythm Wait on anticoagulation if remains in sinus I have seen and examined Phillip [name] and agree with the above assessment  and plan.  [name] MD Beeper [phone] Office [phone] 12/16/2017 12:49 PM

## 2017-12-17 ENCOUNTER — Other Ambulatory Visit: Payer: Self-pay | Admitting: *Deleted

## 2017-12-17 DIAGNOSIS — Z951 Presence of aortocoronary bypass graft: Secondary | ICD-10-CM

## 2017-12-17 LAB — GLUCOSE, CAPILLARY
GLUCOSE-CAPILLARY: 120 mg/dL — AB (ref 65–99)
GLUCOSE-CAPILLARY: 99 mg/dL (ref 65–99)
Glucose-Capillary: 177 mg/dL — ABNORMAL HIGH (ref 65–99)
Glucose-Capillary: 132 mg/dL — ABNORMAL HIGH (ref 65–99)

## 2017-12-17 LAB — CBC
HCT: 36.2 % — ABNORMAL LOW (ref 39.0–52.0)
Hemoglobin: 12.2 g/dL — ABNORMAL LOW (ref 13.0–17.0)
MCH: 31.9 pg (ref 26.0–34.0)
MCHC: 33.7 g/dL (ref 30.0–36.0)
MCV: 94.8 fL (ref 78.0–100.0)
Platelets: 129 10*3/uL — ABNORMAL LOW (ref 150–400)
RBC: 3.82 MIL/uL — ABNORMAL LOW (ref 4.22–5.81)
RDW: 13.7 % (ref 11.5–15.5)
WBC: 10.2 10*3/uL (ref 4.0–10.5)

## 2017-12-17 LAB — BASIC METABOLIC PANEL
Anion gap: 5 (ref 5–15)
BUN: 23 mg/dL — ABNORMAL HIGH (ref 6–20)
CO2: 27 mmol/L (ref 22–32)
Calcium: 8.5 mg/dL — ABNORMAL LOW (ref 8.9–10.3)
Chloride: 103 mmol/L (ref 101–111)
Creatinine, Ser: 0.96 mg/dL (ref 0.61–1.24)
GFR calc Af Amer: >60 mL/min (ref >60)
GFR calc non Af Amer: >60 mL/min (ref >60)
Glucose, Bld: 167 mg/dL — ABNORMAL HIGH (ref 65–99)
Potassium: 5 mmol/L (ref 3.5–5.1)
Sodium: 135 mmol/L (ref 135–145)

## 2017-12-17 MED ORDER — FUROSEMIDE 20 MG PO TABS: 20.0000 mg | ORAL_TABLET | Freq: Every day | ORAL | Status: DC

## 2017-12-17 MED ORDER — FLEET ENEMA 7-19 GM/118ML RE ENEM
1.0000 | ENEMA | Freq: Every day | RECTAL | Status: DC | PRN
  Administered 2017-12-17: 1 via RECTAL
  Filled 2017-12-17: qty 1

## 2017-12-17 MED ORDER — AMIODARONE HCL 200 MG PO TABS
400.0000 mg | ORAL_TABLET | Freq: Two times a day (BID) | ORAL | Status: DC
  Administered 2017-12-17 – 2017-12-18 (×3): 400 mg via ORAL
  Filled 2017-12-17 (×3): qty 2

## 2017-12-17 MED ORDER — METOCLOPRAMIDE HCL 5 MG/ML IJ SOLN
10.0000 mg | Freq: Four times a day (QID) | INTRAMUSCULAR | Status: AC
  Administered 2017-12-17 (×3): 10 mg via INTRAVENOUS
  Filled 2017-12-17 (×3): qty 2

## 2017-12-17 NOTE — Progress Notes (Signed)
Patient with complaints of constipation, dulcolax suppository given as ordered as needed for constipation per patient request. Deserie Dirks, Bettina Gavia RN

## 2017-12-17 NOTE — Progress Notes (Signed)
CARDIAC REHAB PHASE I   PRE:  Rate/Rhythm: 63 SR    BP: sitting 146/87    SaO2: 94 RA  MODE:  Ambulation: 750 ft   POST:  Rate/Rhythm: 70 SR    BP: sitting 170/83     SaO2: 93 RA  Pt stood and walked well, independent with RW. No c/o, steady. Increased distance this am. No afib. To bed after walk. Can walk independently, will need to see if he needs RW for home.  [place], ACSM 12/17/2017 9:19 AM

## 2017-12-17 NOTE — Progress Notes (Signed)
Patient with complaints of constipation and requesting fleets enema. Erin Barrett PAC made aware and orders received. Medication given as ordered to patient for constipation Will monitor patient. Creedon Danielski, Bettina Gavia RN

## 2017-12-17 NOTE — Progress Notes (Signed)
Patient ambulated in hallway with wife. Estee Yohe Jessup RN  

## 2017-12-17 NOTE — Progress Notes (Addendum)
      [address]       [place] [address]             [phone]      5 Days Post-Op Procedure(s) (LRB): CORONARY ARTERY BYPASS GRAFTING (CABG) x3 using the right greater saphenous vein harvested endoscopically and the left internal mammary artery. LIMA to LAD, SEQ SVG to OM1 & OM2 (N/A) TRANSESOPHAGEAL ECHOCARDIOGRAM (TEE) (N/A)   Subjective:  Doing okay.  He continues to have some nausea, which is chronic for him due to previous rounds of radiation he received in his mouth in the past.  He states the pretty much lives on [place] and he has some other foods he will eat.  + ambulation,  No BM  Objective: Vital signs in last 24 hours: Temp:  [98.2 F (36.8 C)-98.8 F (37.1 C)] 98.2 F (36.8 C) (04/29 0419) Pulse Rate:  [51-104] 63 (04/29 0419) Cardiac Rhythm: Bundle branch block (04/29 0200) Resp:  [17-24] 19 (04/29 0651) BP: (131-174)/(79-98) 142/84 (04/29 0419) SpO2:  [81 %-100 %] 95 % (04/29 0419) Weight:  [216 lb 14.4 oz (98.4 kg)] 216 lb 14.4 oz (98.4 kg) (04/29 0651)  Intake/Output from previous day: 04/28 0701 - 04/29 0700 In: 400.8 [I.V.:400.8] Out: 800 [Urine:800]  General appearance: alert, cooperative and no distress Heart: regular rate and rhythm Lungs: clear to auscultation bilaterally Abdomen: soft, non-tender; bowel sounds normal; no masses,  no organomegaly Extremities: edema trace-1+ Wound: clean and dry  Lab Results: Recent Labs    12/15/17 0225 12/17/17 0233  WBC 12.7* 10.2  HGB 12.4* 12.2*  HCT 37.9* 36.2*  PLT 91* 129*   BMET:  Recent Labs    12/15/17 0225 12/17/17 0233  NA 137 135  K 4.6 5.0  CL 104 103  CO2 26 27  GLUCOSE 141* 167*  BUN 24* 23*  CREATININE 0.87 0.96  CALCIUM 8.6* 8.5*    PT/INR: No results for input(s): LABPROT, INR in the last 72 hours. ABG    Component Value Date/Time   PHART 7.365 12/12/2017 1852   HCO3 21.1 12/12/2017 1852   TCO2 23 12/13/2017 1621   ACIDBASEDEF 4.0 (H) 12/12/2017  1852   O2SAT 63.3 12/14/2017 1010   CBG (last 3)  Recent Labs    12/16/17 1621 12/16/17 2238 12/17/17 0640  GLUCAP 132* 142* 120*    Assessment/Plan: S/P Procedure(s) (LRB): CORONARY ARTERY BYPASS GRAFTING (CABG) x3 using the right greater saphenous vein harvested endoscopically and the left internal mammary artery. LIMA to LAD, SEQ SVG to OM1 & OM2 (N/A) TRANSESOPHAGEAL ECHOCARDIOGRAM (TEE) (N/A)  1. CV- PAF, currently Sinus Loletha Grayer- continue Lopressor, will d/c IV Amiodarone start oral Amiodarone at 400 mg BID 2. Pulm- no acute issues, continue IS 3. Real- creatinine WNL, weight is relatively stable, his I/O are negative overall, his edema may be related to mild third spacing will discuss need for Lasix with Dr. Servando Snare 4. DM- sugars controlled 5. Dispo- patient stable, transition to oral Amiodarone today, d/c EPW, place TED hose, discuss need for lasix with Dr. Servando Snare, possibly d/c in AM    LOS: 7 days    Ellwood Handler 12/17/2017  Holding sinus rhythm for the past 24 hours Possible discharge home tomorrow if remains in sinus rhythm I have seen and examined Phillip [name] and agree with the above assessment  and plan.  [name] MD Beeper [phone] Office [phone] 12/17/2017 11:04 AM

## 2017-12-17 NOTE — Progress Notes (Signed)
Pacing wires removed as ordered and per protocol all ends intact. Patient reminded to lie supine approximately one hour. Pt bp 142/74 heart rate 61 on monitor . Will continue to monitor patient. Branch Pacitti, Bettina Gavia RN

## 2017-12-18 LAB — GLUCOSE, CAPILLARY: GLUCOSE-CAPILLARY: 117 mg/dL — AB (ref 65–99)

## 2017-12-18 MED ORDER — FUROSEMIDE 40 MG PO TABS: 40.0000 mg | ORAL_TABLET | Freq: Every day | ORAL | 0 refills | Status: DC

## 2017-12-18 MED ORDER — ASPIRIN 325 MG PO TBEC: 325.0000 mg | DELAYED_RELEASE_TABLET | Freq: Every day | ORAL | 0 refills | Status: DC

## 2017-12-18 MED ORDER — ACETAMINOPHEN 325 MG PO TABS: 650.0000 mg | ORAL_TABLET | Freq: Four times a day (QID) | ORAL | Status: DC | PRN

## 2017-12-18 MED ORDER — AMIODARONE HCL 400 MG PO TABS: 400.0000 mg | ORAL_TABLET | Freq: Two times a day (BID) | ORAL | 1 refills | Status: DC

## 2017-12-18 MED ORDER — TRAMADOL HCL 50 MG PO TABS: 50.0000 mg | ORAL_TABLET | ORAL | 0 refills | Status: DC | PRN

## 2017-12-18 NOTE — Care Management Note (Addendum)
Case Management Note Marvetta Gibbons RN, BSN Unit 4E-Case Manager [phone]  Patient Details  Name: Phillip [name] MRN: [id number] Date of Birth: 1940-11-08  Subjective/Objective: Pt admitted with ACS s/p CABGx3                   Action/Plan: PTA pt lived at home with wife- independent, plan to return home with wife who can provide 24/7 supervision. No CM needs noted for transition to home.  Updated-1035-notified by Cardiac rehab that pt will need RW for discharge- call made to [facility] for DME need- RW to be delivered to room prior to discharge  Expected Discharge Date:  12/18/17               Expected Discharge Plan:  Home/self care In-House Referral:  NA  Discharge planning Services  CM Consult  Post Acute Care Choice:  DME Choice offered to:  NA  DME Arranged:  Rolling walker DME Agency:  [place]:  NA HH Agency:  NA  Status of Service:  Completed, signed off  If discussed at [facility] of [place] Meetings, dates discussed:    Discharge Disposition: home/self care   Additional Comments:  Dawayne Patricia, RN 12/18/2017, 10:30 AM

## 2017-12-18 NOTE — Progress Notes (Signed)
[name] to be D/C'd Home per MD order. Discussed with the patient and all questions fully answered.    IV catheter discontinued intact. Site without signs and symptoms of complications. Dressing and pressure applied.  An After Visit Summary was printed and given to the patient.  Patient to be escorted via [place], and D/C home via private auto.  [place]yndra Numbers  12/18/2017 11:03 AM

## 2017-12-18 NOTE — Progress Notes (Signed)
Ed completed with pt and wife. Good reception. Will refer to [place]. Set up d/c video. [phone] [place], ACSM 10:18 AM 12/18/2017

## 2017-12-18 NOTE — Consult Note (Signed)
            Phillip [name] CM Primary Care Navigator  12/18/2017  Phillip [name] 04/18/1941 [id number]    Seen patient and wife (Phillip [name])at the bedsideto identify possible discharge needs. Patientreports that he presented to his primary care provider's office for routine check-up with reported symptoms concerning for angina. He was sent for outpatient catheterization and found to have multivessel disease per cardiologist which resulted to this admission/ surgery. (underwent coronary artery bypass grafting x 3)  PatientendorsesDr.Javier Danise Mina with [employer] at [place] primary care provider.   Patientshared [name] to obtainmedications without any problem.   Patientstatesthathehas been managinghisown medications at [name] use of "pill box" system filled weekly.  Patient reports that he has been driving prior to admission/ surgery but hiswifewill be providingtransportation to hisdoctors'appointmentsafter discharge.  Patientliveswithwife at [name] serve ashisprimary caregiver whendischarged.   Anticipated discharge plan ishomeperpatient with plans for outpatient cardiac rehab.  Patientand wifevoiced understanding to call primary care provider's office whenhereturns home, for a post discharge follow-up appointment within1- 2 weeksor sooner if needs arise.Patient letter (with PCP's contact number) was provided asareminder.  Explained topatientand wife regardingTHN CM services available for health managementat homebut hedeniesanycurrent needs or concernsat thistime.  Patientand wifeexpressedunderstandingto seek referral from primary care provider to [name] care management ifdeemed necessaryand appropriate for anyservices in the future.  [name] care management information provided for future needs thathemay have.  Patienthowever, hadverbally agreedand  optedforEMMIcalls tofollowup his recoveryat home.  Referral made for [name] General calls after discharge.   For additional questions please contact:  Phillip [name], BSN, RN-BC [name] PRIMARY CARE Navigator Cell: [phone]

## 2017-12-19 ENCOUNTER — Telehealth: Payer: Self-pay | Admitting: *Deleted

## 2017-12-19 NOTE — Telephone Encounter (Signed)
spke to pt and attempted to complete TCM; pt is unable as he said "he doesn't have his hearing aids in." pt scheduled with [facility] as PCP will be out of office

## 2017-12-19 NOTE — Telephone Encounter (Signed)
Also, copied Dr. Darnell Level (PCP) on this message string as well as an FYI.

## 2017-12-19 NOTE — Telephone Encounter (Signed)
Noted  

## 2017-12-20 ENCOUNTER — Other Ambulatory Visit: Payer: Self-pay

## 2017-12-20 NOTE — Patient Outreach (Signed)
[facility]) Care Management  12/20/2017  Maddock Finigan [birth year] [id number]     EMMI-GENERAL DISCHARGE RED ON EMMI ALERT Day # 1 Date: 12/19/17 Red Alert Reason: " Know who to call about changes in condition? No"     Outreach attempt # 1 to patient.  Spoke with spouse(ROI on file) as she voices that patient has company with him.  Reviewed and addressed red alert with spouse.  She voices that they responded in error. She is aware to call cardiologist for an cardiac related issues and PCP for any general concerns. RN CM confirmed that spouse has contact MD info for MDs and know how to get in contact with them. Patient goes to PCP on tomorrow and cardiologist on 01/17/18. No issues with transportation and meds. Spouse voices that she is "strict" when it comes to patient adhering to his diet. She voices that since coming home his tastes buds have changed." Reassured spouse that this was normal and common after an illness and hospitalization. Patient is consuming fluids,staying hydrated and supplementing diet with Glucerna. No further RN CM needs or concerns at this time. Advised spouse that they would get one more automated EMMI-GENERAL post discharge calls to assess how they are doing following recent hospitalization and will receive a call from a nurse if any of their responses were abnormal. She voiced understanding and was appreciative of f/u call.       Plan: RN CM will close case as no further interventions needed at this time.    Enzo Montgomery, RN,BSN,CCM [facility] Telephonic Care Management Coordinator Direct Phone: [phone] Toll Free: [phone] Fax: [phone]

## 2017-12-21 ENCOUNTER — Ambulatory Visit (INDEPENDENT_AMBULATORY_CARE_PROVIDER_SITE_OTHER): Payer: PPO | Admitting: Primary Care

## 2017-12-21 ENCOUNTER — Encounter: Payer: Self-pay | Admitting: Primary Care

## 2017-12-21 VITALS — BP 122/64 | HR 60 | Temp 97.9°F | Ht 73.0 in | Wt 217.8 lb

## 2017-12-21 DIAGNOSIS — Z951 Presence of aortocoronary bypass graft: Secondary | ICD-10-CM | POA: Diagnosis not present

## 2017-12-21 DIAGNOSIS — G4700 Insomnia, unspecified: Secondary | ICD-10-CM | POA: Diagnosis not present

## 2017-12-21 DIAGNOSIS — I2510 Atherosclerotic heart disease of native coronary artery without angina pectoris: Secondary | ICD-10-CM | POA: Diagnosis not present

## 2017-12-21 DIAGNOSIS — I10 Essential (primary) hypertension: Secondary | ICD-10-CM

## 2017-12-21 DIAGNOSIS — R110 Nausea: Secondary | ICD-10-CM

## 2017-12-21 MED ORDER — ZALEPLON 10 MG PO CAPS: 10.0000 mg | ORAL_CAPSULE | Freq: Every evening | ORAL | 0 refills | Status: DC | PRN

## 2017-12-21 MED ORDER — ONDANSETRON 4 MG PO TBDP: 4.0000 mg | ORAL_TABLET | Freq: Three times a day (TID) | ORAL | 0 refills | Status: DC | PRN

## 2017-12-21 NOTE — Assessment & Plan Note (Signed)
S/P CABG x 3 on 12/12/17. Exam today stable, no acute complications in regards to surgery.  Discussed to start daily weights and reporting a weight gain of >2 within 24 hours and/or gain of >5 pounds in one week.  Continue compression socks and elevation.  Continue furosemide, not sure if he's started this yet. Continue Amiodarone until evaluated by cardiology.  Rx for Zofran provided to use PRN.   Follow up with cardiology and surgeon as scheduled.   All hospital labs, notes, imaging reviewed.

## 2017-12-21 NOTE — Patient Instructions (Signed)
Start weighing yourself daily as discussed.  Please call your cardiologist with a weight gain of 2 pounds or greater in 24 hours or 5 pounds or greater within one week.  Make sure to take your furosemide tablet to help facilitate fluid removal.   You may take the ondansetron tablet as needed for nausea.   I sent a refill for [place] to your pharmacy.  Follow up with cardiology and your surgeon as scheduled.  It was a pleasure meeting you!

## 2017-12-21 NOTE — Progress Notes (Signed)
Subjective:    Patient ID: Phillip [name], male    DOB: June 09, 1941, 77 y.o.   MRN: [id number]  HPI  Phillip [name] is a 77 year old male with a history of hypertension, cardiomyopathy, CAD who presents today for [facility] Follow up.  His story begins years ago. He underwent an initial chemical stress test around 10 years ago due to "abnormal ECG" that was found by his PCP at the time. He was told that he had no heart attack or ischemia. He then underwent catheterization which showed a "couple of 30-40% blockages" and hyperlipidemia. He participated in a study for high dose Crestor through the drug [place] for 2 years. His catheterization was repeated 2 years later without change in blockages.   Around Fall of 2018 he was found to have a murmur on exam with PCP. He was referred to cardiology in March 2019 for further evaluation. He underwent echocardiogram with reduced EF of 40-45% with hypokinesis of the anterior, anterior septal, and apical myocardium with grade 1 diastolic dysfunction. He then underwent treadmill Myoview which was high risk with reduced LVEF and ischemia in the LAD region.   He presented for cardiac catheterization on 12/10/17 due to abnormal stress test. Findings included significant 2 vessel CAD to LAD and left circumflex artery, and moderately elevated LVEDP. He was admitted to telemetry for treatment of elevated filling pressures, decreased EF, and cardiac surgery consultation for CABG.   He underwent CABG x 3 with use of the right greater saphenous vein and left internal mammary artery on 12/12/17. He tolerated surgery well. He developed rapid atrial fibrillation and treated with IV amiodarone and successful converted to NSR.   He was discharged home on 12/18/17 with oral amiodarone, furosemide, and Tramadol.   Since discharge he continues to notice right hand swelling and erythema from the IV site. He did mention this during his hospital stay, no one seemed  concerned. He has not started weighing himself at home. He thinks he's taking furesomide. He denies chest pain. He's not taken any Tramadol for pain, rather he's taking extra strength Tylenol every 6 hours as needed. He denies drainage, swelling, redness from the surgical site. He has noticed lower extremity edema bilaterally and is wearing compression socks and elevated his legs at home.  He is requesting a refill of his Sonata 10 mg for insomnia. He's been taking this intermittently for years, takes 5 capsules per year on average. His last refill was in July 2017. He takes this for mind racing thoughts that will occur infrequently. He did take one of his capsules last night with resolve in insomnia.   He continues to struggle with intermittent nausea. This has been problematic since his tonsillar cancer in 2015. He would like a prescription for antiemetics to have on hand.    Wt Readings from Last 3 Encounters:  12/21/17 217 lb 12 oz (98.8 kg)  12/18/17 214 lb 12.8 oz (97.4 kg)  11/12/17 210 lb 4 oz (95.4 kg)     Review of Systems  Constitutional: Negative for chills and fever.  Respiratory: Negative for shortness of breath.   Cardiovascular: Positive for leg swelling. Negative for chest pain.  Gastrointestinal: Positive for nausea. Negative for vomiting.  Skin: Negative for color change.  Neurological: Negative for dizziness.  Psychiatric/Behavioral: Positive for sleep disturbance.       Past Medical History:  Diagnosis Date  . Anxiety   . Cardiomyopathy ([place])    a. 10/2017: echo showing reduced  EF of 40-45%, HK of the anterior, anteroseptal and apical myocardium with Grade 1 DD.   Marland Kitchen Coronary artery disease 2003   a. nonobstructive CAD by cath in 2003 and 2005  . Diarrhea   . Hearing loss    bilateral  . History of benign prostatic hypertrophy   . History of radiation therapy 01/31/10-03/22/10   right tonsil/right neck node 7000 cGy 35 sessions, high risk lymph node volume 5940  cGy 35 sessions, low risk lymph node vol 5600 cGy 35 sessions  . 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 HTN (hypertension)   . Hyperlipidemia    diet controlled- taking medication d/t ensure drinking  . Hypothyroid 01/16/2013  . Neck pain   . Sinus bradycardia   . Squamous cell carcinoma    right tonsil- 2011  . Thrombocytopenia ([place]) 02/02/2012   unclear etiology  . Tinnitus      Social History   Socioeconomic History  . Marital status: Married    Spouse name: Fraser Din  . Number of children: 1  . Years of education: Not on file  . Highest education level: Not on file  Occupational History  . Not on file  Social Needs  . Financial resource strain: Not hard at all  . Food insecurity:    Worry: Never true    Inability: Never true  . Transportation needs:    Medical: No    Non-medical: No  Tobacco Use  . Smoking status: Never Smoker  . Smokeless tobacco: Never Used  Substance and Sexual Activity  . Alcohol use: Yes    Comment: 6 glasses of wine per year  . Drug use: No  . Sexual activity: Yes  Lifestyle  . Physical activity:    Days per week: 0 days    Minutes per session: 0 min  . Stress: Only a little  Relationships  . Social connections:    Talks on phone: Patient refused    Gets together: Patient refused    Attends religious service: Patient refused    Active member of club or organization: Patient refused    Attends meetings of clubs or organizations: Patient refused    Relationship status: Patient refused  . Intimate partner violence:    Fear of current or ex partner: Patient refused    Emotionally abused: Patient refused    Physically abused: Patient refused    Forced sexual activity: Patient refused  Other Topics Concern  . 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    Plays golf, remains very  active with an interest in politics. Jan 2011 moved to area.    Involved in [employer].    He is a runner - has run [employer] and [employer]. S/p torn meniscus.     Past Surgical History:  Procedure Laterality Date  . CARDIAC CATHETERIZATION  2003, 2005   40% blockage, two 30% blockages treated medically Ron Parker)  . COLONOSCOPY  03/2015   TA x3, HP, melanosis coli, severe diverticulosis, rpt 3 yrs ([facility])  . CORONARY ARTERY BYPASS GRAFT N/A 12/12/2017   Procedure: CORONARY ARTERY BYPASS GRAFTING (CABG) x3 using the right greater saphenous vein harvested endoscopically and the left internal mammary artery. LIMA to LAD, SEQ SVG to OM1 & OM2;  Surgeon: [name], MD;  Location: [facility];  Service: Open Heart Surgery;  Laterality: N/A;  . DENTAL SURGERY     extractions  . KNEE ARTHROSCOPY Left   . LEFT HEART CATH AND CORONARY ANGIOGRAPHY N/A 12/10/2017   Procedure: LEFT HEART CATH AND CORONARY ANGIOGRAPHY;  Surgeon: [name], MD;  Location: [facility];  Service: Cardiovascular;  Laterality: N/A;  . PEG PLACEMENT  02/2010  . TEE WITHOUT CARDIOVERSION N/A 12/12/2017   Procedure: TRANSESOPHAGEAL ECHOCARDIOGRAM (TEE);  Surgeon: [name], MD;  Location: [facility];  Service: Open Heart Surgery;  Laterality: N/A;  . TONSILLECTOMY    . VASECTOMY      Family History  Problem Relation Age of Onset  . Coronary artery disease Father   . Heart attack Father 66  . Heart disease Father   . Stroke Mother   . Colon cancer Neg Hx   . Esophageal cancer Neg Hx   . Rectal cancer Neg Hx   . Stomach cancer Neg Hx     Allergies  Allergen Reactions  . Ace Inhibitors Swelling and Other (See Comments)    Possible angioedema (upper lip swelling)  . Morphine And Related Swelling    SWELLING REACTION UNSPECIFIED  [severity rated per PMH, 12/11/2017]    Current Outpatient Medications on File Prior to Visit  Medication Sig Dispense Refill  . acetaminophen  (TYLENOL) 325 MG tablet Take 2 tablets (650 mg total) by mouth every 6 (six) hours as needed for mild pain.    Marland Kitchen amiodarone (PACERONE) 400 MG tablet Take 1 tablet (400 mg total) by mouth 2 (two) times daily. X 7 days, then decrease to 400 mg daily 60 tablet 1  . aspirin EC 325 MG EC tablet Take 1 tablet (325 mg total) by mouth daily. 30 tablet 0  . B Complex-C (SUPER B COMPLEX PO) Take 1 tablet by mouth daily.    . carvedilol (COREG) 3.125 MG tablet Take 1 tablet (3.125 mg total) by mouth 2 (two) times daily with a meal. (Patient not taking: Reported on 12/03/2017) 60 tablet 5  . Cholecalciferol (VITAMIN D) 2000 units CAPS Take 2,000 Units by mouth daily.     Mariane Baumgarten Sodium (STOOL SOFTENER) 100 MG capsule Take 350 mg by mouth daily.     . furosemide (LASIX) 40 MG tablet Take 1 tablet (40 mg total) by mouth daily for 7 days. 7 tablet 0  . Magnesium 250 MG TABS Take 500 mg by mouth daily.     Marland Kitchen MELATONIN PO Take 10 mg by mouth at bedtime.     . mirabegron ER (MYRBETRIQ) 25 MG TB24 tablet Take 25 mg by mouth daily.    . Multiple Vitamin (MULTIVITAMIN) tablet Take 1 tablet by mouth daily.    Ernestine Conrad 3-6-9 Fatty Acids (TRIPLE OMEGA-3-6-9 PO) Take 1 capsule by mouth daily.    Marland Kitchen POTASSIUM GLUCONATE PO Take 198 mg by mouth daily.     . rosuvastatin (CRESTOR) 20 MG tablet Take 1 tablet (20 mg total) by mouth daily. 90 tablet 3  . traMADol (ULTRAM) 50 MG tablet Take 1 tablet (50 mg total) by mouth every 4 (four) hours as needed for moderate pain or severe pain. 30 tablet 0  . vitamin C (ASCORBIC ACID) 500 MG tablet Take 1,000 mg by mouth daily.     . vitamin E 400 UNIT capsule Take 400 Units by mouth daily.     No current facility-administered medications on file prior to visit.     BP 122/64  Pulse 60   Temp 97.9 F (36.6 C) (Oral)   Ht 6\' 1"  (1.854 m)   Wt 217 lb 12 oz (98.8 kg)   SpO2 94%   BMI 28.73 kg/m    Objective:   Physical Exam  Constitutional: He is oriented to person, place,  and time. He appears well-nourished.  Neck: Neck supple.  Cardiovascular: Normal rate and regular rhythm.  Pulmonary/Chest: Effort normal and breath sounds normal.  Abdominal: Soft.  Neurological: He is alert and oriented to person, place, and time.  Skin: Skin is warm and dry.  Surgical site healing well without erythema, drainage, tenderness.          Assessment & Plan:

## 2017-12-21 NOTE — Assessment & Plan Note (Signed)
Stable in the office today. 

## 2017-12-21 NOTE — Assessment & Plan Note (Addendum)
S/P CABG x 3 on 12/12/17. Exam today stable, no acute complications in regards to surgery.  Discussed to start daily weights and reporting a weight gain of >2 within 24 hours and/or gain of >5 pounds in one week.  Continue compression socks and elevation.  Continue furosemide, not sure if he's started this yet. Continue Amiodarone until evaluated by cardiology.  Rx for Zofran provided to use PRN.   Follow up with cardiology and surgeon as scheduled.   All hospital labs, notes, imaging reviewed.

## 2017-12-21 NOTE — Assessment & Plan Note (Signed)
Uses Sonata infrequently.  His bottle today reads a filled date of July 2017. Refilled with 30 capsules today. Follow up with PCP for further refills.

## 2018-01-01 ENCOUNTER — Ambulatory Visit: Payer: Self-pay | Admitting: Physician Assistant

## 2018-01-02 ENCOUNTER — Encounter: Payer: Self-pay | Admitting: Physician Assistant

## 2018-01-02 NOTE — Progress Notes (Signed)
Cardiology Office Note Date:  01/03/2018  Patient ID:  Phillip, [name] 05/18/1941, MRN [id number] PCP:  Phillip Bush, MD  Cardiologist:  Dr. Saunders Revel, MD    Chief Complaint: Hospital follow-up  History of Present Illness: Phillip [name] is a 77 y.o. male with history of CAD status post recent three-vessel CABG on 12/12/2017 with LIMA to LAD as well as sequential reverse SVG to OM1 and distal LCx, chronic systolic CHF secondary to ischemic cardiomyopathy, postoperative A. 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  Prior cardiac catheterization in 2003 and 2005 revealed mild to moderate nonobstructive CAD per patient report.  He was previously followed by Dr. Ron [name] though had not seen a cardiologist for approximately 5 to 10 years prior to reestablishing with Dr. and on 10/24/2017.  At that time he was without symptoms concerning for angina.  He underwent TTE on 11/08/2017 for evaluation of cardiac murmur which showed an EF of 40 to 45%, hypokinesis of the anterior, anteroseptal, and apical myocardium, grade 1 diastolic dysfunction, mildly dilated left atrium, RV systolic function normal, PASP normal.  Because of his cardiomyopathy found on echocardiogram he was evaluated in close follow-up on 11/12/2017 and reported being overall very active without symptoms concerning for angina.  He underwent treadmill Myoview on 11/19/2017 that showed a large defect of severe severity present in the mid anterior, mid anteroseptal, apical anterior, apical septal, and apex location.  These findings were consistent with prior MI with significant peri-infarct ischemia.  EF 34%.  Overall, this was a high risk study.  He underwent diagnostic cardiac catheterization on 12/10/2017 that showed significant two-vessel CAD with CTO of  the proximal LAD and 80 to 90% proximal codominant LCx stenosis.  The left main as well as the proximal and mid LAD/LCx were heavily calcified.  The mid/distal LAD filled via left to left and right to left collaterals.  Due to his multivessel CAD as noted above he was consulted on by cardiothoracic surgery who recommended CABG.  On 12/12/2017 the patient underwent successful three-vessel CABG with LIMA to LAD, sequential reverse SVG to OM1 and distal LCx.  Postoperative course was significant for mild volume overload that improved with gentle diuresis as well as the development of A. fib with RVR that was treated with amiodarone and successfully converted to normal sinus rhythm.  Discharge labs showed a WBC of 10.2, hemoglobin 12.2, platelet 129, potassium 5.0, serum creatinine 0.96.  He was discharged on amiodarone, aspirin 325 mg, Coreg 3.125 mg twice daily, Lasix 40 mg daily for 7 days, magnesium 500 mg daily, Crestor 20 mg daily, along with his noncardiac medications.  Discharge weight 214 pounds.  He was seen by PCP on 12/21/2017 for TCM follow-up and was doing reasonably well from a cardiac standpoint.  He was uncertain if he was taking Lasix at that time.  He did note some lower extremity swelling and was wearing compression stockings along with elevation of legs while at home.  Weight at PCP office noted to be 217 pounds.  He comes in doing well today from a cardiac perspective.  He reports minimal soreness from his surgery and has been taking only Tylenol with good results.  He continues to advance his activity as tolerated, within the guidelines provided by surgery.  He is not checking his blood pressure at home.  Weights have  been stable in the low 200s pounds.  He is no longer taking Lasix as he was prescribed only a 7-day course as above.  He is now taking amiodarone 400 mg once daily as directed.  He has not yet heard from cardiac rehab.  He has a follow-up chest x-ray and appointment was CVTS on 5/20.   His biggest complaint today is decreased appetite, food not tasting well, and nausea.  He has a long history of decreased appetite and decreased taste secondary to his tonsillar cancer status post approximately 30-35 radiation treatments and 2 chemotherapy sessions.  However, he feels like his decreased appetite/taste for food/nausea has somewhat worsened since his surgery.  He is supplementing with Glucerna.  He eats mostly soft foods such as meat loaf or soups.   Past Medical History:  Diagnosis Date  . Anxiety   . Coronary artery disease 2003   a. nonobstructive CAD by cath in 2003 and 2005; b.  Status post three-vessel CABG on 12/12/2017 with LIMA to LAD, sequential reverse SVG to OM1 and distal LCx  . Hearing loss    bilateral  . History of benign prostatic hypertrophy   . History of radiation therapy 01/31/10-03/22/10   right tonsil/right neck node 7000 cGy 35 sessions, high risk lymph node volume 5940 cGy 35 sessions, low risk lymph node vol 5600 cGy 35 sessions  . 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 HTN (hypertension)   . Hyperlipidemia    diet controlled- taking medication d/t ensure drinking  . Hypothyroid 01/16/2013  . Ischemic cardiomyopathy    a. 10/2017: echo showing reduced EF of 40-45%, HK of the anterior, anteroseptal and apical myocardium with Grade 1 DD.   Marland Kitchen Neck pain   . Squamous cell carcinoma    right tonsil- 2011  . Thrombocytopenia ([place]) 02/02/2012   unclear etiology  . Tinnitus     Past Surgical History:  Procedure Laterality Date  . CARDIAC CATHETERIZATION  2003, 2005   40% blockage, two 30% blockages treated medically Phillip [name])  . COLONOSCOPY  03/2015   TA x3, HP, melanosis coli, severe diverticulosis, rpt 3 yrs ([place])  . CORONARY ARTERY BYPASS GRAFT N/A 12/12/2017   Procedure: CORONARY ARTERY BYPASS GRAFTING (CABG) x3 using the right greater saphenous vein harvested endoscopically and the left internal mammary artery. LIMA to LAD, SEQ SVG to OM1 & OM2;  Surgeon:  [name], MD;  Location: [facility];  Service: Open Heart Surgery;  Laterality: N/A;  . DENTAL SURGERY     extractions  . KNEE ARTHROSCOPY Left   . LEFT HEART CATH AND CORONARY ANGIOGRAPHY N/A 12/10/2017   Procedure: LEFT HEART CATH AND CORONARY ANGIOGRAPHY;  Surgeon: [name], MD;  Location: [facility];  Service: Cardiovascular;  Laterality: N/A;  . PEG PLACEMENT  02/2010  . TEE WITHOUT CARDIOVERSION N/A 12/12/2017   Procedure: TRANSESOPHAGEAL ECHOCARDIOGRAM (TEE);  Surgeon: [name], MD;  Location: [facility];  Service: Open Heart Surgery;  Laterality: N/A;  . TONSILLECTOMY    . VASECTOMY      Current Meds  Medication Sig  . acetaminophen (TYLENOL) 325 MG tablet Take 2 tablets (650 mg total) by mouth every 6 (six) hours as needed for mild pain.  Marland Kitchen amiodarone (PACERONE) 400 MG tablet Take 1 tablet (400 mg total) by mouth 2 (two) times daily. X 7 days, then decrease to 400 mg daily  . aspirin EC 325 MG EC tablet Take 1 tablet (325 mg total) by mouth  daily.  . B Complex-C (SUPER B COMPLEX PO) Take 1 tablet by mouth daily.  . carvedilol (COREG) 3.125 MG tablet Take 1 tablet (3.125 mg total) by mouth 2 (two) times daily with a meal.  . Cholecalciferol (VITAMIN D) 2000 units CAPS Take 2,000 Units by mouth daily.   Mariane Baumgarten Sodium (STOOL SOFTENER) 100 MG capsule Take 350 mg by mouth daily.   . Magnesium 250 MG TABS Take 500 mg by mouth daily.   Marland Kitchen MELATONIN PO Take 10 mg by mouth at bedtime.   . mirabegron ER (MYRBETRIQ) 25 MG TB24 tablet Take 25 mg by mouth daily.  . Multiple Vitamin (MULTIVITAMIN) tablet Take 1 tablet by mouth daily.  Ernestine Conrad 3-6-9 Fatty Acids (TRIPLE OMEGA-3-6-9 PO) Take 1 capsule by mouth daily.  . ondansetron (ZOFRAN ODT) 4 MG disintegrating tablet Take 1 tablet (4 mg total) by mouth every 8 (eight) hours as needed for nausea or vomiting.  Marland Kitchen POTASSIUM GLUCONATE PO Take 198 mg by mouth daily.   . rosuvastatin (CRESTOR) 20 MG tablet Take 1 tablet  (20 mg total) by mouth daily.  . vitamin C (ASCORBIC ACID) 500 MG tablet Take 1,000 mg by mouth daily.   . vitamin E 400 UNIT capsule Take 400 Units by mouth daily.  . zaleplon (SONATA) 10 MG capsule Take 1 capsule (10 mg total) by mouth at bedtime as needed for sleep.    Allergies:   Ace inhibitors and Morphine and related   Social History:  The patient  reports that he has never smoked. He has never used smokeless tobacco. He reports that he drinks alcohol. He reports that he does not use drugs.   Family History:  [name] family history includes Coronary artery disease in his father; Heart attack (age of onset: 24) in his father; Heart disease in his father; Stroke in his mother.  ROS:   Review of Systems  Constitutional: Positive for malaise/fatigue. Negative for chills, diaphoresis, fever and weight loss.  HENT: Negative for congestion.   Eyes: Negative for discharge and redness.  Respiratory: Negative for cough, hemoptysis, sputum production, shortness of breath and wheezing.   Cardiovascular: Negative for chest pain, palpitations, orthopnea, claudication, leg swelling and PND.  Gastrointestinal: Positive for nausea. Negative for abdominal pain, blood in stool, heartburn, melena and vomiting.  Genitourinary: Negative for hematuria.  Musculoskeletal: Negative for falls and myalgias.  Skin: Negative for rash.  Neurological: Negative for dizziness, tingling, tremors, sensory change, speech change, focal weakness, loss of consciousness and weakness.  Endo/Heme/Allergies: Does not bruise/bleed easily.  Psychiatric/Behavioral: Negative for substance abuse. The patient is not nervous/anxious.   All other systems reviewed and are negative.    PHYSICAL EXAM:  VS:  BP 138/66 (BP Location: Left Arm, Patient Position: Sitting, Cuff Size: Normal)   Pulse (!) 57   Ht 6\' 2"  (1.88 m)   Wt 207 lb 8 oz (94.1 kg)   BMI 26.64 kg/m  BMI: Body mass index is 26.64 kg/m.  Physical Exam    Constitutional: He is oriented to person, place, and time. He appears well-developed and well-nourished.  HENT:  Head: Normocephalic and atraumatic.  Eyes: Right eye exhibits no discharge. Left eye exhibits no discharge.  Neck: Normal range of motion. No JVD present.  Cardiovascular: Normal rate, regular rhythm, S1 normal, S2 normal and normal heart sounds. Exam reveals no distant heart sounds, no friction rub, no midsystolic click and no opening snap.  No murmur heard. Pulses:  Posterior tibial pulses are 2+ on the right side, and 2+ on the left side.  Midline anterior chest wall surgical scar is healing well without signs of erythema, discharge, ecchymosis, or active bleeding, no tenderness to palpation.  Surgical port sites along the anterior chest wall are well healing without active signs of infection, bleeding, bruising or tenderness to palpation as well.  Pulmonary/Chest: Effort normal and breath sounds normal. No respiratory distress. He has no decreased breath sounds. He has no wheezes. He has no rales. He exhibits no tenderness.  Abdominal: Soft. He exhibits no distension. There is no tenderness.  Resolving ecchymosis of the right lower quadrant  Musculoskeletal: He exhibits no edema.  Vein harvest sites from the right lower extremity are well healing without signs of active bleeding, discharge, bruising, or tenderness to palpation.  Neurological: He is alert and oriented to person, place, and time.  Skin: Skin is warm and dry. No cyanosis. Nails show no clubbing.  Psychiatric: He has a normal mood and affect. His speech is normal and behavior is normal. Judgment and thought content normal.     EKG:  Was ordered and interpreted by me today. Shows sinus bradycardia, 51 bpm, left axis deviation, LVH with early repolarization, possible prior anterior septal infarct, T wave inversion leads I, aVL, V5, V6, poor R wave progression, nonspecific interventricular conduction  delay  Recent Labs: 11/07/2017: TSH 4.84 12/11/2017: ALT 18; B Natriuretic Peptide 287.1 12/15/2017: Magnesium 2.1 12/17/2017: BUN 23; Creatinine, Ser 0.96; Hemoglobin 12.2; Platelets 129; Potassium 5.0; Sodium 135  11/07/2017: Cholesterol 138; HDL 44.40; LDL Cholesterol 65; Total CHOL/HDL Ratio 3; Triglycerides 144.0; VLDL 28.8   Estimated Creatinine Clearance: 73.7 mL/min (by C-G formula based on SCr of 0.96 mg/dL).   Wt Readings from Last 3 Encounters:  01/03/18 207 lb 8 oz (94.1 kg)  12/21/17 217 lb 12 oz (98.8 kg)  12/18/17 214 lb 12.8 oz (97.4 kg)     Other studies reviewed: Additional studies/records reviewed today include: summarized above  ASSESSMENT AND PLAN:  1. CAD of the native coronary arteries without angina s/p 3-vessel CABG: No symptoms concerning for angina at this time.  He is progressing quite well following his bypass surgery.  He continues to advance his activities as tolerated, within the surgical guidelines.  He has a follow-up chest x-ray and is it with CVTS on 5/20.  Continue aggressive risk factor modification and secondary prevention.  Continue ASA 325 mg daily per CVTS.  Referral has been made to cardiac rehab at [facility].  2. Chronic systolic CHF due to ICM: He does not appear grossly volume overloaded at this time.  Continue carvedilol 3.125 mg twice daily.  Is bradycardic heart rate in the 50s bpm at this time precludes titration however, with the discontinuation of amiodarone, at follow-up would look to escalate carvedilol therapy as heart rate and blood pressure allow.  Not on ACE inhibitor secondary to angioedema.  It is unclear if he has ever been challenged with an ARB and that this could be considered in the future.  Consider addition of spironolactone at follow-up.  No longer needing daily Lasix.  CHF education.  Daily weights.  3. Postoperative Afib: Currently in sinus rhythm with a bradycardic heart rate of 51 bpm.  No objective evidence of further episodes  of A. fib.  Given the patient's main complaint today of nausea we will discontinue his amiodarone at this time as he is maintaining sinus rhythm with a bradycardic heart rate.  Should he redevelop  A. fib long-term, full dose anticoagulation would need to be considered.  4. Decreased appetite/nausea: I suspect this is multifactorial including the patient's history of tonsillar cancer with multiple episodes of radiation as well as recent addition of amiodarone as above.  Given he has maintained sinus rhythm and in the setting of his nausea which she indicates is affecting his quality of life we have stopped amiodarone at this time.  Should his symptoms persist, he should follow-up with his PCP.  Continue to supplement with nutrition shakes.  5. HTN: Blood pressure is reasonably controlled today.  Heart rate in the low to mid 50s beats per minute precludes titration of carvedilol at this time.  If blood pressure continues to run in the 622W systolic consider addition of second antihypertensive medication at follow-up versus titration of carvedilol if heart rate allows.  6. HLD: LDL of 65 in 10/2017.  Continue Crestor 20 mg daily.  7. Anemia: Likely postoperative.  Check CBC.  8. Hyperglycemia: A1c of 6.0 from 10/2017. Lifestyle modification advised. Per PCP.     Disposition: F/u with Dr. Saunders [name] in at previously scheduled appointment on 5/30.  He has follow-up with CVTS on 5/20.  Current medicines are reviewed at length with the patient today.  The patient did not have any concerns regarding medicines.  Signed, Christell Faith, PA-C 01/03/2018 8:46 AM     [address] [place], [place] [address] [phone]

## 2018-01-03 ENCOUNTER — Ambulatory Visit: Payer: PPO | Admitting: Physician Assistant

## 2018-01-03 ENCOUNTER — Encounter: Payer: Self-pay | Admitting: Physician Assistant

## 2018-01-03 VITALS — BP 138/66 | HR 57 | Ht 74.0 in | Wt 207.5 lb

## 2018-01-03 DIAGNOSIS — E785 Hyperlipidemia, unspecified: Secondary | ICD-10-CM

## 2018-01-03 DIAGNOSIS — I2510 Atherosclerotic heart disease of native coronary artery without angina pectoris: Secondary | ICD-10-CM | POA: Diagnosis not present

## 2018-01-03 DIAGNOSIS — I4891 Unspecified atrial fibrillation: Secondary | ICD-10-CM

## 2018-01-03 DIAGNOSIS — R110 Nausea: Secondary | ICD-10-CM

## 2018-01-03 DIAGNOSIS — I9789 Other postprocedural complications and disorders of the circulatory system, not elsewhere classified: Secondary | ICD-10-CM | POA: Diagnosis not present

## 2018-01-03 DIAGNOSIS — I10 Essential (primary) hypertension: Secondary | ICD-10-CM

## 2018-01-03 DIAGNOSIS — R630 Anorexia: Secondary | ICD-10-CM | POA: Diagnosis not present

## 2018-01-03 DIAGNOSIS — I429 Cardiomyopathy, unspecified: Secondary | ICD-10-CM

## 2018-01-03 DIAGNOSIS — I255 Ischemic cardiomyopathy: Secondary | ICD-10-CM

## 2018-01-03 DIAGNOSIS — Z951 Presence of aortocoronary bypass graft: Secondary | ICD-10-CM | POA: Diagnosis not present

## 2018-01-03 DIAGNOSIS — R739 Hyperglycemia, unspecified: Secondary | ICD-10-CM

## 2018-01-03 DIAGNOSIS — D649 Anemia, unspecified: Secondary | ICD-10-CM

## 2018-01-03 NOTE — Patient Instructions (Signed)
Medication Instructions:  Your physician has recommended you make the following change in your medication:  STOP Amiodarone.   Labwork: Your physician recommends that you return for lab work in: [place].   Testing/Procedures: none  Follow-Up: You have been referred to Cardiac Rehab. Please call [phone].  Your physician recommends that you schedule a follow-up appointment as scheduled with Dr End.   If you need a refill on your cardiac medications before your next appointment, please call your pharmacy.

## 2018-01-04 ENCOUNTER — Other Ambulatory Visit: Payer: Self-pay | Admitting: Cardiothoracic Surgery

## 2018-01-04 DIAGNOSIS — Z951 Presence of aortocoronary bypass graft: Secondary | ICD-10-CM

## 2018-01-04 LAB — CBC WITH DIFFERENTIAL/PLATELET
BASOS: 0 %
Basophils Absolute: 0 10*3/uL (ref 0.0–0.2)
EOS (ABSOLUTE): 0.1 10*3/uL (ref 0.0–0.4)
EOS: 1 %
HEMATOCRIT: 39.8 % (ref 37.5–51.0)
Hemoglobin: 12.8 g/dL — ABNORMAL LOW (ref 13.0–17.7)
Immature Grans (Abs): 0 10*3/uL (ref 0.0–0.1)
Immature Granulocytes: 0 %
LYMPHS ABS: 0.3 10*3/uL — AB (ref 0.7–3.1)
Lymphs: 5 %
MCH: 30.2 pg (ref 26.6–33.0)
MCHC: 32.2 g/dL (ref 31.5–35.7)
MCV: 94 fL (ref 79–97)
MONOS ABS: 0.6 10*3/uL (ref 0.1–0.9)
Monocytes: 8 %
Neutrophils Absolute: 5.8 10*3/uL (ref 1.4–7.0)
Neutrophils: 86 %
Platelets: 268 10*3/uL (ref 150–379)
RBC: 4.24 x10E6/uL (ref 4.14–5.80)
RDW: 14.2 % (ref 12.3–15.4)
WBC: 6.8 10*3/uL (ref 3.4–10.8)

## 2018-01-07 ENCOUNTER — Ambulatory Visit
Admission: RE | Admit: 2018-01-07 | Discharge: 2018-01-07 | Disposition: A | Discharge status: Home / Self Care | Payer: PPO | Source: Ambulatory Visit | Attending: Cardiothoracic Surgery | Admitting: Cardiothoracic Surgery

## 2018-01-07 ENCOUNTER — Other Ambulatory Visit: Payer: Self-pay

## 2018-01-07 ENCOUNTER — Ambulatory Visit (INDEPENDENT_AMBULATORY_CARE_PROVIDER_SITE_OTHER): Payer: PPO | Admitting: Cardiovascular Disease

## 2018-01-07 ENCOUNTER — Telehealth: Payer: Self-pay | Admitting: Physician Assistant

## 2018-01-07 ENCOUNTER — Ambulatory Visit (INDEPENDENT_AMBULATORY_CARE_PROVIDER_SITE_OTHER): Payer: Self-pay | Admitting: Physician Assistant

## 2018-01-07 VITALS — BP 100/74 | HR 118 | Resp 18 | Ht 74.0 in | Wt 205.0 lb

## 2018-01-07 VITALS — BP 136/74 | HR 118 | Ht 74.0 in | Wt 205.0 lb

## 2018-01-07 DIAGNOSIS — J90 Pleural effusion, not elsewhere classified: Secondary | ICD-10-CM | POA: Insufficient documentation

## 2018-01-07 DIAGNOSIS — I2510 Atherosclerotic heart disease of native coronary artery without angina pectoris: Secondary | ICD-10-CM | POA: Diagnosis not present

## 2018-01-07 DIAGNOSIS — I255 Ischemic cardiomyopathy: Secondary | ICD-10-CM

## 2018-01-07 DIAGNOSIS — Z951 Presence of aortocoronary bypass graft: Secondary | ICD-10-CM

## 2018-01-07 DIAGNOSIS — I4892 Unspecified atrial flutter: Secondary | ICD-10-CM

## 2018-01-07 DIAGNOSIS — E785 Hyperlipidemia, unspecified: Secondary | ICD-10-CM

## 2018-01-07 DIAGNOSIS — I483 Typical atrial flutter: Secondary | ICD-10-CM | POA: Diagnosis not present

## 2018-01-07 DIAGNOSIS — R110 Nausea: Secondary | ICD-10-CM | POA: Diagnosis not present

## 2018-01-07 DIAGNOSIS — R630 Anorexia: Secondary | ICD-10-CM

## 2018-01-07 DIAGNOSIS — I10 Essential (primary) hypertension: Secondary | ICD-10-CM

## 2018-01-07 HISTORY — DX: Unspecified atrial flutter: I48.92

## 2018-01-07 MED ORDER — FUROSEMIDE 20 MG PO TABS: 20.0000 mg | ORAL_TABLET | Freq: Every day | ORAL | 3 refills | Status: DC

## 2018-01-07 MED ORDER — APIXABAN 5 MG PO TABS: 5.0000 mg | ORAL_TABLET | Freq: Two times a day (BID) | ORAL | 6 refills | Status: DC

## 2018-01-07 MED ORDER — CARVEDILOL 12.5 MG PO TABS: 12.5000 mg | ORAL_TABLET | Freq: Two times a day (BID) | ORAL | 3 refills | Status: DC

## 2018-01-07 MED ORDER — DIGOXIN 250 MCG PO TABS: 0.2500 mg | ORAL_TABLET | Freq: Every day | ORAL | 3 refills | Status: DC

## 2018-01-07 NOTE — Patient Instructions (Addendum)
Medication Instructions:  Your physician has recommended you make the following change in your medication:  1. START Eliquis 5 mg Twice a day 2. INCREASE Carvedilol 12.5 mg Twice a day 3. START Digoxin 0.25 mg Once daily 4. RESTART Furosemide 20 mg once daily  Medication Samples have been provided to the patient.  Drug name: Eliquis       Strength: 5 mg        Qty: 4 boxes  LOT: [id number]  Exp.Date: Mar 2021   Labwork:  No new labs needed  Testing/Procedures:  No further testing at this time   Follow-Up: It was a pleasure seeing you in the office today. Please call us if you have new issues that need to be addressed before your next appt.  [phone]  Your physician wants you to follow-up in:  Dr. Saunders Revel of the month    If you need a refill on your cardiac medications before your next appointment, please call your pharmacy.  For educational health videos Log in to : [email or web address] Or : [email or web address], password : [email or web address]

## 2018-01-07 NOTE — Progress Notes (Signed)
[address]       [place] [address]             [phone]      Zymarion Favorite is a 77 y.o. male patient s/p  CABG x 3 here for his routine follow-up appointment.    1. S/P CABG x 3    Past Medical History:  Diagnosis Date  . Anxiety   . Coronary artery disease 2003   a. nonobstructive CAD by cath in 2003 and 2005; b.  Status post three-vessel CABG on 12/12/2017 with LIMA to LAD, sequential reverse SVG to OM1 and distal LCx  . Hearing loss    bilateral  . History of benign prostatic hypertrophy   . History of radiation therapy 01/31/10-03/22/10   right tonsil/right neck node 7000 cGy 35 sessions, high risk lymph node volume 5940 cGy 35 sessions, low risk lymph node vol 5600 cGy 35 sessions  . 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 HTN (hypertension)   . Hyperlipidemia    diet controlled- taking medication d/t ensure drinking  . Hypothyroid 01/16/2013  . Ischemic cardiomyopathy    a. 10/2017: echo showing reduced EF of 40-45%, HK of the anterior, anteroseptal and apical myocardium with Grade 1 DD.   Marland Kitchen Neck pain   . Squamous cell carcinoma    right tonsil- 2011  . Thrombocytopenia ([place]) 02/02/2012   unclear etiology  . Tinnitus    No past surgical history pertinent negatives on file. Scheduled Meds: Current Outpatient Medications on File Prior to Visit  Medication Sig Dispense Refill  . acetaminophen (TYLENOL) 325 MG tablet Take 2 tablets (650 mg total) by mouth every 6 (six) hours as needed for mild pain.    Marland Kitchen aspirin EC 325 MG EC tablet Take 1 tablet (325 mg total) by mouth daily. 30 tablet 0  . B Complex-C (SUPER B COMPLEX PO) Take 1 tablet by mouth daily.    . carvedilol (COREG) 3.125 MG tablet Take 1 tablet (3.125 mg total) by mouth 2 (two) times daily with a meal. 60 tablet 5  . Cholecalciferol (VITAMIN D) 2000 units CAPS Take 2,000 Units by mouth daily.     Mariane Baumgarten Sodium (STOOL SOFTENER) 100 MG capsule Take 350 mg by mouth daily.     . Magnesium 250 MG  TABS Take 500 mg by mouth daily.     Marland Kitchen MELATONIN PO Take 10 mg by mouth at bedtime.     . mirabegron ER (MYRBETRIQ) 25 MG TB24 tablet Take 25 mg by mouth daily.    . Multiple Vitamin (MULTIVITAMIN) tablet Take 1 tablet by mouth daily.    Ernestine Conrad 3-6-9 Fatty Acids (TRIPLE OMEGA-3-6-9 PO) Take 1 capsule by mouth daily.    . ondansetron (ZOFRAN ODT) 4 MG disintegrating tablet Take 1 tablet (4 mg total) by mouth every 8 (eight) hours as needed for nausea or vomiting. 20 tablet 0  . POTASSIUM GLUCONATE PO Take 198 mg by mouth daily.     . rosuvastatin (CRESTOR) 20 MG tablet Take 1 tablet (20 mg total) by mouth daily. 90 tablet 3  . vitamin C (ASCORBIC ACID) 500 MG tablet Take 1,000 mg by mouth daily.     . vitamin E 400 UNIT capsule Take 400 Units by mouth daily.    . zaleplon (SONATA) 10 MG capsule Take 1 capsule (10 mg total) by mouth at bedtime as needed for sleep. 30 capsule 0   No current  facility-administered medications on file prior to visit.     Allergies  Allergen Reactions  . Ace Inhibitors Swelling and Other (See Comments)    Possible angioedema (upper lip swelling)  . Morphine And Related Swelling    SWELLING REACTION UNSPECIFIED  [severity rated per PMH, 12/11/2017]   Blood pressure 100/74, pulse (!) 118, resp. rate 18, height 6\' 2"  (1.88 m), weight 205 lb (93 kg), SpO2 94 %.  Subjective : Mr. Thone is status post coronary bypass grafting x3.  He is here for his routine 4-week follow-up appointment.  He does not feel well today.  He went to see cardiology on Friday and he explained that food does not taste the same and he is nauseous.  He still feels this way during my interview with him.  He has had limited shortness of breath with ambulation.  His pain has been well controlled.  Objective  Cor: sinus tachycardia Pulm: diminished lower lobes Abd; no tenderness Ext: 1-2+ pitting pedal edema > on the right Wound: clean and dry  Assessment & Plan  Mr. Sunday Spillers presents today  for routine follow-up visit.  I reviewed his chest x-ray which showed small bilateral pleural effusions greater on the left.  He also does have some mild pedal edema.  He had been on Lasix in the past but is no longer taking it.  My biggest concern today is his rhythm.  At his cardiologist's office on Friday he was bradycardic and his blood pressure had a systolic of 759F.  Today, the picture is very different.  He is tachycardic rate about 638 and his systolic blood pressure is 100.  I obtained a rhythm strip and discussed this with Dr. Roxy Manns who thinks that it could be atrial flutter.  His amiodarone was discontinued on Friday since he had been nauseous and bradycardic.  He had been taking 400 mg daily.  He currently is not taking any amiodarone.  He is on a low-dose Coreg which I am unable to titrate today due to his blood pressure.  I do feel like he needs a more thorough evaluation of his rhythm at this time, therefore I called Dr. Darnelle Bos office to see if they could fit him in later today for an appointment and do a twelve-lead EKG.  They said that this was possible and I sent Mr. Stuhr to their office to get a more thorough evaluation.  I do think that he would benefit from low-dose amiodarone, may be 200 mg daily.  I think that we should hold off on diuretics currently because he is potentially third spacing but is intravascularly dry.  His weight is not a good determinant for fluid balance due to his decrease in appetite and his history with radiation due to his malignant neoplasm of his tonsil and being on ultimately a liquid diet.   Plan: He is to report to Dr. Darnelle Bos office for an EKG and a thorough evaluation of his rhythm.  We will hold off on diuretic therapy for now but I do want to bring him back to the office in a week with a follow-up chest x-ray to reevaluate.  I encouraged him to continue keeping track of his weight daily.  I encouraged him to try and increase oral intake as tolerated.  No  medication changes were made at this visit.  Elgie Collard 01/07/2018

## 2018-01-07 NOTE — Patient Instructions (Addendum)
Follow-up in one week with our office.   Follow-up with Dr. Darnelle Bos office for 12 lead EKG.

## 2018-01-07 NOTE — Progress Notes (Addendum)
Cardiology Office Note  Date:  01/07/2018   ID:  Phillip [name], DOB 1940-11-04, MRN [id number]  PCP:  Phillip Bush, MD   Chief Complaint  Patient presents with  . Other    [name]. Meds reviewed by the pt. verbally.     HPI:  [name] is a 77 year old gentleman with past medical history of CAD CABG  12/12/2017 LIMA to the LAD, vein graft to OM, distal circumflex Chronic systolic CHF, ischemic or myopathy Postoperative atrial fibrillation initially on amiodarone Tonsillar squamous cell cancer with radiation, chemotherapy 2011 (following treatment required feeding 2, has chronic nausea, drinks several cans of Glucerna daily) Hypertension Hyperlipidemia Who presents to office today after being sent for tachycardia from cardiothoracic surgery outpatient office visit  During his visit with CT surgery he was noted to have tachycardia rate 118 bpm On his last clinic visit in our office 01/03/2018 heart rate 57 bpm  He reports he is asymptomatic Denies any tachycardia, shortness of breath, orthostasis, near syncope Did not appreciate anything was wrong until he was seen in clinic today in [facility]  01/03/2018 amiodarone was held ut of fear this was causing his nausea He does report chronic nausea, but symptoms have been different, unable to taste food as well So far has not noticed much difference by holding the amiodarone  c hest x-ray performed today showing small bilateral pleural effusions left greater than right  Echo 11/08/2017  EF 40 to 45% Hypokinesis of the anterior, anteroseptal and apical myocardium.  Cardiac cath 12/09/2017 1. Significant 2-vessel coronary artery disease with chronic total occlusion of proximal LAD and 80-90% proximal codominant LCx stenosis.  The LMCA as well as the proximal and mid LAD/LCx are heavily calcified.  The mid/distal LAD fill via left-to-left and right-to-left collaterals. 2. Moderately elevated left ventricular  filling pressure.   3. LVEF known to be moderately reduced by recent echo.   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  PSH:    Past Surgical History:  Procedure Laterality Date  . CARDIAC CATHETERIZATION  2003, 2005   40% blockage, two 30% blockages treated medically Ron Parker)  . COLONOSCOPY  03/2015   TA x3, HP, melanosis coli, severe diverticulosis, rpt 3 yrs ([place])  . CORONARY ARTERY BYPASS GRAFT N/A 12/12/2017   Procedure: CORONARY ARTERY BYPASS GRAFTING (CABG) x3 using the right greater saphenous vein harvested endoscopically and the left internal mammary artery. LIMA to LAD, SEQ SVG to OM1 & OM2;  Surgeon: [name], MD;  Location: [place];  Service: Open Heart Surgery;  Laterality: N/A;  . DENTAL SURGERY     extractions  . KNEE ARTHROSCOPY Left   . LEFT HEART CATH AND CORONARY ANGIOGRAPHY N/A 12/10/2017   Procedure: LEFT HEART CATH AND CORONARY ANGIOGRAPHY;  Surgeon: [name], MD;  Location: [place] [facility];  Service: Cardiovascular;  Laterality: N/A;  . PEG PLACEMENT  02/2010  . TEE WITHOUT CARDIOVERSION N/A 12/12/2017   Procedure: TRANSESOPHAGEAL ECHOCARDIOGRAM (TEE);  Surgeon: [name], MD;  Location: [place];  Service: Open Heart Surgery;  Laterality: N/A;  . TONSILLECTOMY    . VASECTOMY  Current Outpatient Medications  Medication Sig Dispense Refill  . acetaminophen (TYLENOL) 325 MG tablet Take 2 tablets (650 mg total) by mouth every 6 (six) hours as needed for mild pain.    . B Complex-C (SUPER B COMPLEX PO) Take 1  tablet by mouth daily.    . Cholecalciferol (VITAMIN D) 2000 units CAPS Take 2,000 Units by mouth daily.     Mariane Baumgarten Sodium (STOOL SOFTENER) 100 MG capsule Take 350 mg by mouth daily.     . Magnesium 250 MG TABS Take 500 mg by mouth daily.     Marland Kitchen MELATONIN PO Take 10 mg by mouth at bedtime.     . mirabegron ER (MYRBETRIQ) 25 MG TB24 tablet Take 25 mg by mouth daily.    . Multiple Vitamin (MULTIVITAMIN) tablet Take 1 tablet by mouth daily.    Ernestine Conrad 3-6-9 Fatty Acids (TRIPLE OMEGA-3-6-9 PO) Take 1 capsule by mouth daily.    . ondansetron (ZOFRAN ODT) 4 MG disintegrating tablet Take 1 tablet (4 mg total) by mouth every 8 (eight) hours as needed for nausea or vomiting. 20 tablet 0  . POTASSIUM GLUCONATE PO Take 198 mg by mouth daily.     . rosuvastatin (CRESTOR) 20 MG tablet Take 1 tablet (20 mg total) by mouth daily. 90 tablet 3  . vitamin C (ASCORBIC ACID) 500 MG tablet Take 1,000 mg by mouth daily.     . vitamin E 400 UNIT capsule Take 400 Units by mouth daily.    . zaleplon (SONATA) 10 MG capsule Take 1 capsule (10 mg total) by mouth at bedtime as needed for sleep. 30 capsule 0  . apixaban (ELIQUIS) 5 MG TABS tablet Take 1 tablet (5 mg total) by mouth 2 (two) times daily. 60 tablet 6  . carvedilol (COREG) 12.5 MG tablet Take 1 tablet (12.5 mg total) by mouth 2 (two) times daily. 180 tablet 3  . digoxin (LANOXIN) 0.25 MG tablet Take 1 tablet (0.25 mg total) by mouth daily. 90 tablet 3  . furosemide (LASIX) 20 MG tablet Take 1 tablet (20 mg total) by mouth daily. 90 tablet 3   No current facility-administered medications for this visit.      Allergies:   Ace inhibitors and Morphine and related   Social History:  The patient  reports that he has never smoked. He has never used smokeless tobacco. He reports that he drinks alcohol. He reports that he does not use drugs.   Family History:   family history includes Coronary artery disease in his father; Heart attack (age of onset: 12) in his  father; Heart disease in his father; Stroke in his mother.    Review of Systems: Review of Systems  Constitutional: Negative.   Respiratory: Negative.   Cardiovascular: Negative.   Gastrointestinal: Negative.   Musculoskeletal: Negative.   Neurological: Negative.   Psychiatric/Behavioral: Negative.   All other systems reviewed and are negative.    PHYSICAL EXAM: VS:  BP 136/74 (BP Location: Left Arm, Patient Position: Sitting, Cuff Size: Normal)   Pulse (!) 118   Ht 6\' 2"  (1.88 m)   Wt 205 lb (93 kg)   BMI 26.32 kg/m  , BMI Body mass index is 26.32 kg/m. GEN: Well nourished, well developed, in no acute distress  HEENT: normal  Neck: no JVD, carotid bruits, or masses Cardiac:  no murmurs, rubs, or gallops,trace to 1+ pitting lower extremity edema  bilaterally Respiratory:  clear to auscultation bilaterally, normal work of breathing GI:  soft, nontender, nondistended, + BS MS: no deformity or atrophy  Skin: warm and dry, no rash Neuro:  Strength and sensation are intact Psych: euthymic mood, full affect   Recent Labs: 11/07/2017: TSH 4.84 12/11/2017: ALT 18; B Natriuretic Peptide 287.1 12/15/2017: Magnesium 2.1 12/17/2017: BUN 23; Creatinine, Ser 0.96; Potassium 5.0; Sodium 135 01/03/2018: Hemoglobin 12.8; Platelets 268    Lipid Panel Lab Results  Component Value Date   CHOL 138 11/07/2017   HDL 44.40 11/07/2017   LDLCALC 65 11/07/2017   TRIG 144.0 11/07/2017      Wt Readings from Last 3 Encounters:  01/07/18 205 lb (93 kg)  01/07/18 205 lb (93 kg)  01/03/18 207 lb 8 oz (94.1 kg)     ASSESSMENT AND PLAN:  Typical atrial flutter (HCC) New finding, elevated rate Recommended he start anticoagulation with eliquis 5 mg po BID,  for rate control, recommended he increase Coreg up to 12.5 mg twice a day with close monitoring of blood pressure Suggested he start digoxin 0.25 mg daily Scheduled to see Dr. Saunders Revel in 10 days. Will need a digoxin level at that  time Recommended he call us in the next several days with his heart rate numbers in blood pressure numbers We dispensed some time talking about transesophageal echo and cardioversion Currently reports he is asymptomatic and referred rate control and waiting 30 days before proceeding with a cardioversion Suggested if symptoms change, that he call us and we would change the plan We'll try to avoid calcium channel blockers given his cardiomyopathy Uncertain if we we have to control his rate with changes above. All of this was discussed with him  Coronary artery disease involving native coronary artery of native heart without angina pectoris - Plan: EKG 12-Lead Recent bypass surgery, continues to recover Residual small bilateral pleural effusions Given his new atrial flutter, recommended he restart Lasix 20 mg daily. This can be held once normal sinus rhythm has been restored, and effusions improve  Ischemic cardiomyopathy ejection fraction 40-45% prior to bypass surgery  Essential hypertension We will increase his carvedilol as detailed above for rate control  Nausea Chronic nausea vomiting chemotherapy radiation treatment Drinks Glucerna on a regular basis Decreased appetite  Hyperlipidemia, unspecified hyperlipidemia type Recommended he stay on Crestor 20 ng daily Goal LDL less than 70  Chronic bilateral pleural effusions We'll restart Lasix 20 mg daily especially in light of new atrial flutter  Disposition:   F/U  With Dr. Saunders Revel in 10 days   Total encounter time more than 45 minutes  Greater than 50% was spent in counseling and coordination of care with the patient    Orders Placed This Encounter  Procedures  . EKG 12-Lead     Signed, Esmond Plants, M.D., Ph.D. 01/07/2018  [place] , [place]

## 2018-01-07 NOTE — Telephone Encounter (Signed)
PA from [facility] called stating that patient is in afib possible flutter rate of 112-118 and would like him to be seen today. We do not have any openings and discussed case with Christell Faith PA which recommended follow up in 1 week. The PA states that they do not have 12 lead capabilities at their office but from what they can see rate was 118. They again stressed that he should see someone today. Offered for patient to come in for EKG check but no providers are available. They were agreeable with this plan and had no further questions. Patient en route to our office from [place].

## 2018-01-09 ENCOUNTER — Other Ambulatory Visit: Payer: Self-pay | Admitting: Cardiothoracic Surgery

## 2018-01-09 ENCOUNTER — Telehealth: Payer: Self-pay | Admitting: *Deleted

## 2018-01-09 DIAGNOSIS — Z951 Presence of aortocoronary bypass graft: Secondary | ICD-10-CM

## 2018-01-09 NOTE — Telephone Encounter (Signed)
Rates are reasonably controlled with episodic tachycardia. Agree with limiting BP checks. Defer digoxin to Dr. Rockey Situ

## 2018-01-09 NOTE — Telephone Encounter (Signed)
Received incoming call from patient and wife. Patient's concerns: [facility] will not have his digoxin until after 3 pm today because they are getting the shipment in; therefore he has not started it yet. Amiodarone was stopped on 01/03/18. Yesterday, 01/08/18, patient relaxed most of the day and took the following BP/HR: AM 149/113 123; 152/113 116 Later through the day 104/66, 84    126/74, 68    138/75, 73 8 pm 136/99, 121 10 pm 111/75, 79 Today, 01/09/18 133/102, 123    146/101, 96    169/102, 75 (all taken over the course of several minutes) Denies shortness of breath, chest pain, palpitations, dizziness of any other issues at this time. He is concerned about his BP running so high and the importance of keeping it down re: recent CABG. Advised that taking BP/HR so frequently could produce anxiety and that taking so much should be limited to if he feels symptoms. Taking 1-2 daily at first is ok to make sure medications are working well and then gradually limit BP taking. Advised to plan to get digoxin from [facility] later today and I will route to [facility] for review and further recommendations.

## 2018-01-15 ENCOUNTER — Ambulatory Visit (INDEPENDENT_AMBULATORY_CARE_PROVIDER_SITE_OTHER): Payer: Self-pay | Admitting: Surgical

## 2018-01-15 ENCOUNTER — Other Ambulatory Visit: Payer: Self-pay

## 2018-01-15 ENCOUNTER — Other Ambulatory Visit: Payer: Self-pay | Admitting: *Deleted

## 2018-01-15 ENCOUNTER — Ambulatory Visit: Payer: Self-pay

## 2018-01-15 ENCOUNTER — Ambulatory Visit
Admission: RE | Admit: 2018-01-15 | Discharge: 2018-01-15 | Disposition: A | Discharge status: Home / Self Care | Payer: PPO | Source: Ambulatory Visit | Attending: Cardiothoracic Surgery | Admitting: Cardiothoracic Surgery

## 2018-01-15 VITALS — BP 123/76 | HR 62 | Resp 16 | Ht 74.0 in | Wt 204.6 lb

## 2018-01-15 DIAGNOSIS — J90 Pleural effusion, not elsewhere classified: Secondary | ICD-10-CM

## 2018-01-15 DIAGNOSIS — Z951 Presence of aortocoronary bypass graft: Secondary | ICD-10-CM

## 2018-01-15 DIAGNOSIS — I2510 Atherosclerotic heart disease of native coronary artery without angina pectoris: Secondary | ICD-10-CM

## 2018-01-15 NOTE — Patient Instructions (Signed)
Discussed activity progression including lifting and driving 

## 2018-01-15 NOTE — Progress Notes (Signed)
[address]       [place] [address]             [phone]      Phillip [name] [place] Medical Record #[id number] Date of Birth: Sep 01, 1940  Referring: [name], MD Primary Care: [name], MD Primary Cardiologist: [name], MD   Chief Complaint:   POST OP FOLLOW UP   OPERATIVE REPORT  DATE OF PROCEDURE:  12/12/2017  PREOPERATIVE DIAGNOSIS:  Coronary occlusive disease with depressed left ventricular function, left main equivalent.  POSTOPERATIVE DIAGNOSIS:  Coronary occlusive disease with depressed left ventricular function, left main equivalent.  SURGICAL PROCEDURE:  Coronary artery bypass grafting x3 with the left internal mammary to the left anterior descending coronary artery, sequential reverse saphenous vein graft to the first obtuse marginal and distal circumflex with right thigh greater  saphenous endovein harvesting.  SURGEON:  Lanelle Bal, MD  FIRST ASSISTANT:  Jadene Pierini, PA     History of Present Illness:    Patient is a 77 year old male status post the above described procedure.  He is seen in the office in routine postsurgical follow-up.  He was seen last week where he was found to be in atrial flutter and had an urgent office appointment with cardiology who adjusted his medications to including adding Eliquis.  Additionally his Coreg was increased to 12.5 mg twice daily and he was started on digoxin.  Since that time he reports that he is feeling a little better although does continue to have some easy fatigability.  He denies shortness of breath or dyspnea on exertion.  He denies cough or sputum production.  He has not had any fevers or chills.  He records his blood pressure several times a day and is quite variable with systolics in the range of 90s to 170s.  He has a left-sided pleural effusion noted on today's chest x-ray is fairly stable in appearance.  He was also started on Lasix during that  appointment.  It does not appear to be making a significant impact on the effusion at this time.      Past Medical History:  Diagnosis Date  . Anxiety   . Coronary artery disease 2003   a. nonobstructive CAD by cath in 2003 and 2005; b.  Status post three-vessel CABG on 12/12/2017 with LIMA to LAD, sequential reverse SVG to OM1 and distal LCx  . Hearing loss    bilateral  . History of benign prostatic hypertrophy   . History of radiation therapy 01/31/10-03/22/10   right tonsil/right neck node 7000 cGy 35 sessions, high risk lymph node volume 5940 cGy 35 sessions, low risk lymph node vol 5600 cGy 35 sessions  . 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 HTN (hypertension)   . Hyperlipidemia    diet controlled- taking medication d/t ensure drinking  . Hypothyroid 01/16/2013  . Ischemic cardiomyopathy    a. 10/2017: echo showing reduced EF of 40-45%, HK of the anterior, anteroseptal and apical myocardium with Grade 1 DD.   Marland Kitchen Neck pain   . Squamous cell carcinoma    right tonsil- 2011  . Thrombocytopenia ([place]) 02/02/2012   unclear etiology  . Tinnitus      Social History   Tobacco Use  Smoking Status Never Smoker  Smokeless Tobacco Never Used    Social History   Substance and Sexual Activity  Alcohol Use Yes   Comment: 6 glasses of wine per year  Allergies  Allergen Reactions  . Ace Inhibitors Swelling and Other (See Comments)    Possible angioedema (upper lip swelling)  . Morphine And Related Swelling    SWELLING REACTION UNSPECIFIED  [severity rated per PMH, 12/11/2017]    Current Outpatient Medications  Medication Sig Dispense Refill  . acetaminophen (TYLENOL) 325 MG tablet Take 2 tablets (650 mg total) by mouth every 6 (six) hours as needed for mild pain.    Marland Kitchen apixaban (ELIQUIS) 5 MG TABS tablet Take 1 tablet (5 mg total) by mouth 2 (two) times daily. 60 tablet 6  . B Complex-C (SUPER B COMPLEX PO) Take 1 tablet by mouth daily.    . carvedilol (COREG) 12.5 MG tablet Take 1  tablet (12.5 mg total) by mouth 2 (two) times daily. 180 tablet 3  . Cholecalciferol (VITAMIN D) 2000 units CAPS Take 2,000 Units by mouth daily.     . digoxin (LANOXIN) 0.25 MG tablet Take 1 tablet (0.25 mg total) by mouth daily. 90 tablet 3  . Docusate Sodium (STOOL SOFTENER) 100 MG capsule Take 350 mg by mouth daily.     . furosemide (LASIX) 20 MG tablet Take 1 tablet (20 mg total) by mouth daily. 90 tablet 3  . Magnesium 250 MG TABS Take 500 mg by mouth daily.     Marland Kitchen MELATONIN PO Take 10 mg by mouth at bedtime.     . mirabegron ER (MYRBETRIQ) 25 MG TB24 tablet Take 25 mg by mouth daily.    . Multiple Vitamin (MULTIVITAMIN) tablet Take 1 tablet by mouth daily.    Ernestine Conrad 3-6-9 Fatty Acids (TRIPLE OMEGA-3-6-9 PO) Take 1 capsule by mouth daily.    . ondansetron (ZOFRAN ODT) 4 MG disintegrating tablet Take 1 tablet (4 mg total) by mouth every 8 (eight) hours as needed for nausea or vomiting. 20 tablet 0  . POTASSIUM GLUCONATE PO Take 198 mg by mouth daily.     . rosuvastatin (CRESTOR) 20 MG tablet Take 1 tablet (20 mg total) by mouth daily. 90 tablet 3  . vitamin C (ASCORBIC ACID) 500 MG tablet Take 1,000 mg by mouth daily.     . vitamin E 400 UNIT capsule Take 400 Units by mouth daily.    . zaleplon (SONATA) 10 MG capsule Take 1 capsule (10 mg total) by mouth at bedtime as needed for sleep. 30 capsule 0   No current facility-administered medications for this visit.        Physical Exam: BP 123/76 (BP Location: Left Arm, Patient Position: Sitting, Cuff Size: Normal)   Pulse 62   Resp 16   Ht 6\' 2"  (1.88 m)   Wt 92.8 kg (204 lb 9.6 oz)   SpO2 96% Comment: ON RA  BMI 26.27 kg/m   General appearance: alert, cooperative and no distress Heart: regular rate and rhythm Lungs: Diminished in the left greater than right lower fields. Abdomen: Benign exam Extremities: Positive lower extremity edema Wound: Incisions healing well without evidence of infection   Diagnostic Studies &  Laboratory data:     Recent Radiology Findings:   Dg Chest 2 View  Result Date: 01/15/2018 CLINICAL DATA:  CABG.  History of small pleural effusions. EXAM: CHEST - 2 VIEW COMPARISON:  01/07/2018. FINDINGS: Prior CABG. Cardiomegaly with normal pulmonary vascularity. Bibasilar atelectasis/infiltrates with bilateral pleural effusions. No acute bony abnormality. IMPRESSION: 1.  Prior CABG.  Stable cardiomegaly. 2. Bibasilar atelectasis/infiltrates with bilateral pleural effusions. Electronically Signed   By: Marcello Moores  Register   On:  01/15/2018 14:55      Recent Lab Findings: Lab Results  Component Value Date   WBC 6.8 01/03/2018   HGB 12.8 (L) 01/03/2018   HCT 39.8 01/03/2018   PLT 268 01/03/2018   GLUCOSE 167 (H) 12/17/2017   CHOL 138 11/07/2017   TRIG 144.0 11/07/2017   HDL 44.40 11/07/2017   LDLDIRECT 101.0 10/13/2014   LDLCALC 65 11/07/2017   ALT 18 12/11/2017   AST 19 12/11/2017   NA 135 12/17/2017   K 5.0 12/17/2017   CL 103 12/17/2017   CREATININE 0.96 12/17/2017   BUN 23 (H) 12/17/2017   CO2 27 12/17/2017   TSH 4.84 (H) 11/07/2017   INR 1.41 12/12/2017   HGBA1C 6.0 11/07/2017      Assessment / Plan: Overall the patient is progressing well from a surgical perspective.  We will try to arrange a left-sided ultrasound-guided thoracentesis for the left effusion.  There is been minimal impact to to Lasix on this effusion.  He will continue the Lasix and also follow-up with cardiology this Thursday.  We will see the patient in the office in the next 2 to 4 weeks with a repeat chest x-ray at that time.          John Giovanni, PA-C 01/15/2018 3:08 PM

## 2018-01-17 ENCOUNTER — Ambulatory Visit (HOSPITAL_COMMUNITY)
Admission: RE | Admit: 2018-01-17 | Discharge: 2018-01-17 | Disposition: A | Discharge status: Home / Self Care | Payer: PPO | Source: Ambulatory Visit | Attending: Radiology | Admitting: Radiology

## 2018-01-17 ENCOUNTER — Ambulatory Visit: Payer: PPO | Admitting: Internal Medicine

## 2018-01-17 ENCOUNTER — Encounter: Payer: Self-pay | Admitting: Internal Medicine

## 2018-01-17 ENCOUNTER — Ambulatory Visit (HOSPITAL_COMMUNITY)
Admission: RE | Admit: 2018-01-17 | Discharge: 2018-01-17 | Disposition: A | Discharge status: Home / Self Care | Payer: PPO | Source: Ambulatory Visit | Attending: Surgical | Admitting: Surgical

## 2018-01-17 ENCOUNTER — Other Ambulatory Visit (HOSPITAL_COMMUNITY): Payer: Self-pay | Admitting: Radiology

## 2018-01-17 VITALS — BP 130/70 | HR 61 | Ht 74.0 in | Wt 204.0 lb

## 2018-01-17 DIAGNOSIS — I2510 Atherosclerotic heart disease of native coronary artery without angina pectoris: Secondary | ICD-10-CM

## 2018-01-17 DIAGNOSIS — E785 Hyperlipidemia, unspecified: Secondary | ICD-10-CM | POA: Diagnosis not present

## 2018-01-17 DIAGNOSIS — J90 Pleural effusion, not elsewhere classified: Secondary | ICD-10-CM | POA: Diagnosis not present

## 2018-01-17 DIAGNOSIS — J9811 Atelectasis: Secondary | ICD-10-CM | POA: Diagnosis not present

## 2018-01-17 DIAGNOSIS — Z5181 Encounter for therapeutic drug level monitoring: Secondary | ICD-10-CM

## 2018-01-17 DIAGNOSIS — Z951 Presence of aortocoronary bypass graft: Secondary | ICD-10-CM | POA: Diagnosis not present

## 2018-01-17 DIAGNOSIS — Z79899 Other long term (current) drug therapy: Secondary | ICD-10-CM | POA: Diagnosis not present

## 2018-01-17 DIAGNOSIS — I10 Essential (primary) hypertension: Secondary | ICD-10-CM

## 2018-01-17 DIAGNOSIS — I255 Ischemic cardiomyopathy: Secondary | ICD-10-CM

## 2018-01-17 DIAGNOSIS — I483 Typical atrial flutter: Secondary | ICD-10-CM

## 2018-01-17 HISTORY — PX: IR THORACENTESIS ASP PLEURAL SPACE W/IMG GUIDE: IMG5380

## 2018-01-17 MED ORDER — LIDOCAINE HCL (PF) 2 % IJ SOLN
INTRAMUSCULAR | Status: AC
  Filled 2018-01-17: qty 20

## 2018-01-17 MED ORDER — ASPIRIN EC 81 MG PO TBEC: 81.0000 mg | DELAYED_RELEASE_TABLET | Freq: Every day | ORAL | 3 refills | Status: DC

## 2018-01-17 MED ORDER — LIDOCAINE HCL (PF) 2 % IJ SOLN
INTRAMUSCULAR | Status: DC | PRN
  Administered 2018-01-17: 10 mL

## 2018-01-17 NOTE — H&P (View-Only) (Signed)
Follow-up Outpatient Visit Date: 01/17/2018  Primary Care Provider: [name], MD [place] [address]  Chief Complaint: Follow-up coronary artery disease and cardiomyopathy  HPI:  Mr. Mistry is a 77 y.o. year-old male with history of coronary artery disease status post CABG in 01/736, chronic systolic heart failure secondary to ischemic cardiomyopathy, hypertension, hyperlipidemia,, who presents for follow-up of coronary artery disease and atrial flutter.  He was last seen on 01/07/2018 by Dr. Rockey Situ urgently due to tachycardia noted at cardiac surgery clinic.  EKG showed atrial flutter.  Mr. Kennedy Bucker was started on apixaban and digoxin.  Carvedilol was also increased to 12.5 mg twice daily at that time.  Today, Mr. Coker reports doing well.  He has only minimal chest wall soreness following his CABG, which is well controlled with acetaminophen.  He has not had any angina nor shortness of breath, palpitations, or lightheadedness.  He notes mild swelling in his legs, worse on the right at the site of saphenous vein harvest.  He was noted to have a left pleural effusion at the time of most recent cardiac surgery visit and is scheduled for ultrasound-guided thoracentesis later today by radiology.  Mr. Gorelik has not heard from cardiac rehab despite multiple requests and referrals to begin the enrollment process.  Mr. Kock has been monitoring his blood pressure and heart rate closely at home.  Morning readings are often high before he takes his medications, with systolic pressures up to 106 mmHg.  Most readings throughout the rest of the day are within the normal range, similar to today's reading.  He is tolerating increased dose of carvedilol as well as digoxin and apixaban well.  He has not had any significant bleeding.  --------------------------------------------------------------------------------------------------  Past Medical History:  Diagnosis Date  .  Anxiety   . Atrial flutter ([place]) 01/07/2018   Post-op after CABG  . Coronary artery disease 2003   a. nonobstructive CAD by cath in 2003 and 2005; b.  Status post three-vessel CABG on 12/12/2017 with LIMA to LAD, sequential reverse SVG to OM1 and distal LCx  . Hearing loss    bilateral  . History of benign prostatic hypertrophy   . History of radiation therapy 01/31/10-03/22/10   right tonsil/right neck node 7000 cGy 35 sessions, high risk lymph node volume 5940 cGy 35 sessions, low risk lymph node vol 5600 cGy 35 sessions  . 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 HTN (hypertension)   . Hyperlipidemia    diet controlled- taking medication d/t ensure drinking  . Hypothyroid 01/16/2013  . Ischemic cardiomyopathy    a. 10/2017: echo showing reduced EF of 40-45%, HK of the anterior, anteroseptal and apical myocardium with Grade 1 DD.   Marland Kitchen Neck pain   . Squamous cell carcinoma    right tonsil- 2011  . Thrombocytopenia ([place]) 02/02/2012   unclear etiology  . Tinnitus    Past Surgical History:  Procedure Laterality Date  . CARDIAC CATHETERIZATION  2003, 2005   40% blockage, two 30% blockages treated medically Ron Parker)  . COLONOSCOPY  03/2015   TA x3, HP, melanosis coli, severe diverticulosis, rpt 3 yrs (Pyrtle)  . CORONARY ARTERY BYPASS GRAFT N/A 12/12/2017   Procedure: CORONARY ARTERY BYPASS GRAFTING (CABG) x3 using the right greater saphenous vein harvested endoscopically and the left internal mammary artery. LIMA to LAD, SEQ SVG to OM1 & OM2;  Surgeon: [name], MD;  Location: [facility];  Service: Open Heart Surgery;  Laterality:  N/A;  . DENTAL SURGERY     extractions  . KNEE ARTHROSCOPY Left   . LEFT HEART CATH AND CORONARY ANGIOGRAPHY N/A 12/10/2017   Procedure: LEFT HEART CATH AND CORONARY ANGIOGRAPHY;  Surgeon: [name], MD;  Location: [facility];  Service: Cardiovascular;  Laterality: N/A;  . PEG PLACEMENT  02/2010  . TEE WITHOUT CARDIOVERSION N/A 12/12/2017   Procedure:  TRANSESOPHAGEAL ECHOCARDIOGRAM (TEE);  Surgeon: [name], MD;  Location: [facility];  Service: Open Heart Surgery;  Laterality: N/A;  . TONSILLECTOMY    . VASECTOMY      Current Meds  Medication Sig  . acetaminophen (TYLENOL) 325 MG tablet Take 2 tablets (650 mg total) by mouth every 6 (six) hours as needed for mild pain.  Marland Kitchen apixaban (ELIQUIS) 5 MG TABS tablet Take 1 tablet (5 mg total) by mouth 2 (two) times daily.  . B Complex-C (SUPER B COMPLEX PO) Take 1 tablet by mouth daily.  . carvedilol (COREG) 12.5 MG tablet Take 1 tablet (12.5 mg total) by mouth 2 (two) times daily.  . Cholecalciferol (VITAMIN D) 2000 units CAPS Take 2,000 Units by mouth daily.   . digoxin (LANOXIN) 0.25 MG tablet Take 1 tablet (0.25 mg total) by mouth daily.  Mariane Baumgarten Sodium (STOOL SOFTENER) 100 MG capsule Take 350 mg by mouth daily.   . furosemide (LASIX) 20 MG tablet Take 1 tablet (20 mg total) by mouth daily.  . Magnesium 250 MG TABS Take 500 mg by mouth daily.   Marland Kitchen MELATONIN PO Take 10 mg by mouth at bedtime.   . mirabegron ER (MYRBETRIQ) 25 MG TB24 tablet Take 25 mg by mouth daily.  . Multiple Vitamin (MULTIVITAMIN) tablet Take 1 tablet by mouth daily.  Ernestine Conrad 3-6-9 Fatty Acids (TRIPLE OMEGA-3-6-9 PO) Take 1 capsule by mouth daily.  . ondansetron (ZOFRAN ODT) 4 MG disintegrating tablet Take 1 tablet (4 mg total) by mouth every 8 (eight) hours as needed for nausea or vomiting.  Marland Kitchen POTASSIUM GLUCONATE PO Take 198 mg by mouth daily.   . rosuvastatin (CRESTOR) 20 MG tablet Take 1 tablet (20 mg total) by mouth daily.  . vitamin C (ASCORBIC ACID) 500 MG tablet Take 1,000 mg by mouth daily.   . vitamin E 400 UNIT capsule Take 400 Units by mouth daily.  . zaleplon (SONATA) 10 MG capsule Take 1 capsule (10 mg total) by mouth at bedtime as needed for sleep.    Allergies: Ace inhibitors and Morphine and related  Social History   Tobacco Use  . Smoking status: Never Smoker  . Smokeless tobacco: Never Used    Substance Use Topics  . Alcohol use: Yes    Comment: 6 glasses of wine per year  . Drug use: No    Family History  Problem Relation Age of Onset  . Coronary artery disease Father   . Heart attack Father 85  . Heart disease Father   . Stroke Mother   . Colon cancer Neg Hx   . Esophageal cancer Neg Hx   . Rectal cancer Neg Hx   . Stomach cancer Neg Hx     Review of Systems: A 12-system review of systems was performed and was negative except as noted in the HPI.  --------------------------------------------------------------------------------------------------  Physical Exam: BP 130/70 (BP Location: Left Arm, Patient Position: Sitting, Cuff Size: Normal)   Pulse 61   Ht 6\' 2"  (1.88 m)   Wt 204 lb (92.5 kg)   BMI 26.19 kg/m  General: NAD. HEENT: No conjunctival pallor or scleral icterus. Moist mucous membranes.  OP clear. Neck: Supple without lymphadenopathy, thyromegaly, JVD, or HJR. Lungs: Normal work of breathing. Clear to auscultation bilaterally without wheezes or crackles. Heart: Regular rate and rhythm without murmurs, rubs, or gallops. Non-displaced PMI. Abd: Bowel sounds present. Soft, NT/ND without hepatosplenomegaly Ext: No lower extremity edema. Radial, PT, and DP pulses are 2+ bilaterally. Skin: Warm and dry without rash.  Median sternotomy scar well-healed.  EKG: Atrial flutter with 4: 1 AV conduction, left axis deviation, inferior Q waves, and nonspecific ST segment changes.  ST elevations in lead III and aVF are confounded by superimposed flutter waves.  Lab Results  Component Value Date   WBC 6.8 01/03/2018   HGB 12.8 (L) 01/03/2018   HCT 39.8 01/03/2018   MCV 94 01/03/2018   PLT 268 01/03/2018    Lab Results  Component Value Date   NA 135 12/17/2017   K 5.0 12/17/2017   CL 103 12/17/2017   CO2 27 12/17/2017   BUN 23 (H) 12/17/2017   CREATININE 0.96 12/17/2017   GLUCOSE 167 (H) 12/17/2017   ALT 18 12/11/2017    Lab Results  Component  Value Date   CHOL 138 11/07/2017   HDL 44.40 11/07/2017   LDLCALC 65 11/07/2017   LDLDIRECT 101.0 10/13/2014   TRIG 144.0 11/07/2017   CHOLHDL 3 11/07/2017    --------------------------------------------------------------------------------------------------  ASSESSMENT AND PLAN: Coronary artery disease without angina Mr. Buffa has recovered well from CABG.  He has not had any further angina or significant shortness of breath.  I will contact cardiac rehab and asked them to reach out to Mr. Kurka to ensure that he begins cardiac rehab as soon as he is cleared by Dr. Servando Snare.  In the meantime, I will continue with carvedilol and rosuvastatin.  Given significant CAD and recent CABG, I have asked Mr. Donahoe to restart aspirin 81 mg daily, even though he is also on apixaban at this time.  Ischemic cardiomyopathy Mr. Sunday Spillers appears euvolemic and well compensated.  We will continue his current doses of carvedilol and furosemide.  Given concern for angioedema in the past with ACE inhibitors, we will not forego a retrial of ACE inhibitor or ARB.  We will plan to repeat an echocardiogram approximately 3 months after CABG to reassess his LV function.  Atrial flutter This persists today although ventricular response is well controlled.  We will continue carvedilol 12.5 mg twice daily and also check a digoxin level.  I would prefer to discontinue digoxin soon, though given good heart rate control today, we will continue with this for the time being as long as his level is not supratherapeutic.  I will have Mr. Corella return for an EKG in mid June.  If he remains in atrial flutter at that point, we will make arrangements for cardioversion.  He should continue apixaban 5 mg twice daily, given CHADSVASC score of at least 4.  Hypertension Blood pressure upper normal today, similar to most home readings.  I will defer medication changes at this time.  Hyperlipidemia Most recent LDL at goal (less than 70).   Continue rosuvastatin 20 mg daily.  Follow-up: EKG in approximately 2 to 3 weeks.  Further follow-up pending EKG and likely cardioversion.  [name], MD 01/17/2018 11:00 AM

## 2018-01-17 NOTE — Procedures (Signed)
PROCEDURE SUMMARY:  Successful US guided left thoracentesis. Yielded 1.5 L of clear amber fluid. Pt tolerated procedure well. No immediate complications.  Specimen was not sent for labs. CXR ordered.  Ascencion Dike PA-C 01/17/2018 1:44 PM

## 2018-01-17 NOTE — Patient Instructions (Addendum)
Medication Instructions:  Your physician has recommended you make the following change in your medication:  1- In 2 days, START Aspirin 81 mg by mouth once a day.    Labwork: Your physician recommends that you return for lab work in: TODAY (Digoxin).   Testing/Procedures: none  Follow-Up: Your physician recommends that you schedule a follow-up appointment in: Mid- June for EKG check.   If you need a refill on your cardiac medications before your next appointment, please call your pharmacy.

## 2018-01-17 NOTE — Progress Notes (Signed)
Follow-up Outpatient Visit Date: 01/17/2018  Primary Care Provider: [name], MD [place] [address]  Chief Complaint: Follow-up coronary artery disease and cardiomyopathy  HPI:  Phillip [name] is a 77 y.o. year-old male with history of coronary artery disease status post CABG in 09/7060, chronic systolic heart failure secondary to ischemic cardiomyopathy, hypertension, hyperlipidemia,, who presents for follow-up of coronary artery disease and atrial flutter.  He was last seen on 01/07/2018 by Dr. Rockey Situ urgently due to tachycardia noted at cardiac surgery clinic.  EKG showed atrial flutter.  Phillip [name] was started on apixaban and digoxin.  Carvedilol was also increased to 12.5 mg twice daily at that time.  Today, Phillip [name] reports doing well.  He has only minimal chest wall soreness following his CABG, which is well controlled with acetaminophen.  He has not had any angina nor shortness of breath, palpitations, or lightheadedness.  He notes mild swelling in his legs, worse on the right at the site of saphenous vein harvest.  He was noted to have a left pleural effusion at the time of most recent cardiac surgery visit and is scheduled for ultrasound-guided thoracentesis later today by radiology.  Phillip [name] has not heard from cardiac rehab despite multiple requests and referrals to begin the enrollment process.  Phillip [name] has been monitoring his blood pressure and heart rate closely at home.  Morning readings are often high before he takes his medications, with systolic pressures up to 376 mmHg.  Most readings throughout the rest of the day are within the normal range, similar to today's reading.  He is tolerating increased dose of carvedilol as well as digoxin and apixaban well.  He has not had any significant bleeding.  --------------------------------------------------------------------------------------------------  Past Medical History:  Diagnosis Date  .  Anxiety   . Atrial flutter ([place]) 01/07/2018   Post-op after CABG  . Coronary artery disease 2003   a. nonobstructive CAD by cath in 2003 and 2005; b.  Status post three-vessel CABG on 12/12/2017 with LIMA to LAD, sequential reverse SVG to OM1 and distal LCx  . Hearing loss    bilateral  . History of benign prostatic hypertrophy   . History of radiation therapy 01/31/10-03/22/10   right tonsil/right neck node 7000 cGy 35 sessions, high risk lymph node volume 5940 cGy 35 sessions, low risk lymph node vol 5600 cGy 35 sessions  . 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 HTN (hypertension)   . Hyperlipidemia    diet controlled- taking medication d/t ensure drinking  . Hypothyroid 01/16/2013  . Ischemic cardiomyopathy    a. 10/2017: echo showing reduced EF of 40-45%, HK of the anterior, anteroseptal and apical myocardium with Grade 1 DD.   Marland Kitchen Neck pain   . Squamous cell carcinoma    right tonsil- 2011  . Thrombocytopenia ([place]) 02/02/2012   unclear etiology  . Tinnitus    Past Surgical History:  Procedure Laterality Date  . CARDIAC CATHETERIZATION  2003, 2005   40% blockage, two 30% blockages treated medically Ron Parker)  . COLONOSCOPY  03/2015   TA x3, HP, melanosis coli, severe diverticulosis, rpt 3 yrs (Pyrtle)  . CORONARY ARTERY BYPASS GRAFT N/A 12/12/2017   Procedure: CORONARY ARTERY BYPASS GRAFTING (CABG) x3 using the right greater saphenous vein harvested endoscopically and the left internal mammary artery. LIMA to LAD, SEQ SVG to OM1 & OM2;  Surgeon: [name], MD;  Location: [facility];  Service: Open Heart Surgery;  Laterality:  N/A;  . DENTAL SURGERY     extractions  . KNEE ARTHROSCOPY Left   . LEFT HEART CATH AND CORONARY ANGIOGRAPHY N/A 12/10/2017   Procedure: LEFT HEART CATH AND CORONARY ANGIOGRAPHY;  Surgeon: [name], MD;  Location: [facility];  Service: Cardiovascular;  Laterality: N/A;  . PEG PLACEMENT  02/2010  . TEE WITHOUT CARDIOVERSION N/A 12/12/2017   Procedure:  TRANSESOPHAGEAL ECHOCARDIOGRAM (TEE);  Surgeon: [name], MD;  Location: [facility];  Service: Open Heart Surgery;  Laterality: N/A;  . TONSILLECTOMY    . VASECTOMY      Current Meds  Medication Sig  . acetaminophen (TYLENOL) 325 MG tablet Take 2 tablets (650 mg total) by mouth every 6 (six) hours as needed for mild pain.  Marland Kitchen apixaban (ELIQUIS) 5 MG TABS tablet Take 1 tablet (5 mg total) by mouth 2 (two) times daily.  . B Complex-C (SUPER B COMPLEX PO) Take 1 tablet by mouth daily.  . carvedilol (COREG) 12.5 MG tablet Take 1 tablet (12.5 mg total) by mouth 2 (two) times daily.  . Cholecalciferol (VITAMIN D) 2000 units CAPS Take 2,000 Units by mouth daily.   . digoxin (LANOXIN) 0.25 MG tablet Take 1 tablet (0.25 mg total) by mouth daily.  Mariane Baumgarten Sodium (STOOL SOFTENER) 100 MG capsule Take 350 mg by mouth daily.   . furosemide (LASIX) 20 MG tablet Take 1 tablet (20 mg total) by mouth daily.  . Magnesium 250 MG TABS Take 500 mg by mouth daily.   Marland Kitchen MELATONIN PO Take 10 mg by mouth at bedtime.   . mirabegron ER (MYRBETRIQ) 25 MG TB24 tablet Take 25 mg by mouth daily.  . Multiple Vitamin (MULTIVITAMIN) tablet Take 1 tablet by mouth daily.  Ernestine Conrad 3-6-9 Fatty Acids (TRIPLE OMEGA-3-6-9 PO) Take 1 capsule by mouth daily.  . ondansetron (ZOFRAN ODT) 4 MG disintegrating tablet Take 1 tablet (4 mg total) by mouth every 8 (eight) hours as needed for nausea or vomiting.  Marland Kitchen POTASSIUM GLUCONATE PO Take 198 mg by mouth daily.   . rosuvastatin (CRESTOR) 20 MG tablet Take 1 tablet (20 mg total) by mouth daily.  . vitamin C (ASCORBIC ACID) 500 MG tablet Take 1,000 mg by mouth daily.   . vitamin E 400 UNIT capsule Take 400 Units by mouth daily.  . zaleplon (SONATA) 10 MG capsule Take 1 capsule (10 mg total) by mouth at bedtime as needed for sleep.    Allergies: Ace inhibitors and Morphine and related  Social History   Tobacco Use  . Smoking status: Never Smoker  . Smokeless tobacco: Never Used    Substance Use Topics  . Alcohol use: Yes    Comment: 6 glasses of wine per year  . Drug use: No    Family History  Problem Relation Age of Onset  . Coronary artery disease Father   . Heart attack Father 66  . Heart disease Father   . Stroke Mother   . Colon cancer Neg Hx   . Esophageal cancer Neg Hx   . Rectal cancer Neg Hx   . Stomach cancer Neg Hx     Review of Systems: A 12-system review of systems was performed and was negative except as noted in the HPI.  --------------------------------------------------------------------------------------------------  Physical Exam: BP 130/70 (BP Location: Left Arm, Patient Position: Sitting, Cuff Size: Normal)   Pulse 61   Ht 6\' 2"  (1.88 m)   Wt 204 lb (92.5 kg)   BMI 26.19 kg/m  General: NAD. HEENT: No conjunctival pallor or scleral icterus. Moist mucous membranes.  OP clear. Neck: Supple without lymphadenopathy, thyromegaly, JVD, or HJR. Lungs: Normal work of breathing. Clear to auscultation bilaterally without wheezes or crackles. Heart: Regular rate and rhythm without murmurs, rubs, or gallops. Non-displaced PMI. Abd: Bowel sounds present. Soft, NT/ND without hepatosplenomegaly Ext: No lower extremity edema. Radial, PT, and DP pulses are 2+ bilaterally. Skin: Warm and dry without rash.  Median sternotomy scar well-healed.  EKG: Atrial flutter with 4: 1 AV conduction, left axis deviation, inferior Q waves, and nonspecific ST segment changes.  ST elevations in lead III and aVF are confounded by superimposed flutter waves.  Lab Results  Component Value Date   WBC 6.8 01/03/2018   HGB 12.8 (L) 01/03/2018   HCT 39.8 01/03/2018   MCV 94 01/03/2018   PLT 268 01/03/2018    Lab Results  Component Value Date   NA 135 12/17/2017   K 5.0 12/17/2017   CL 103 12/17/2017   CO2 27 12/17/2017   BUN 23 (H) 12/17/2017   CREATININE 0.96 12/17/2017   GLUCOSE 167 (H) 12/17/2017   ALT 18 12/11/2017    Lab Results  Component  Value Date   CHOL 138 11/07/2017   HDL 44.40 11/07/2017   LDLCALC 65 11/07/2017   LDLDIRECT 101.0 10/13/2014   TRIG 144.0 11/07/2017   CHOLHDL 3 11/07/2017    --------------------------------------------------------------------------------------------------  ASSESSMENT AND PLAN: Coronary artery disease without angina Phillip [name] has recovered well from CABG.  He has not had any further angina or significant shortness of breath.  I will contact cardiac rehab and asked them to reach out to Phillip [name] to ensure that he begins cardiac rehab as soon as he is cleared by Dr. Servando Snare.  In the meantime, I will continue with carvedilol and rosuvastatin.  Given significant CAD and recent CABG, I have asked Phillip [name] to restart aspirin 81 mg daily, even though he is also on apixaban at this time.  Ischemic cardiomyopathy Mr. Sunday Spillers appears euvolemic and well compensated.  We will continue his current doses of carvedilol and furosemide.  Given concern for angioedema in the past with ACE inhibitors, we will not forego a retrial of ACE inhibitor or ARB.  We will plan to repeat an echocardiogram approximately 3 months after CABG to reassess his LV function.  Atrial flutter This persists today although ventricular response is well controlled.  We will continue carvedilol 12.5 mg twice daily and also check a digoxin level.  I would prefer to discontinue digoxin soon, though given good heart rate control today, we will continue with this for the time being as long as his level is not supratherapeutic.  I will have Phillip [name] return for an EKG in mid June.  If he remains in atrial flutter at that point, we will make arrangements for cardioversion.  He should continue apixaban 5 mg twice daily, given CHADSVASC score of at least 4.  Hypertension Blood pressure upper normal today, similar to most home readings.  I will defer medication changes at this time.  Hyperlipidemia Most recent LDL at goal (less than 70).   Continue rosuvastatin 20 mg daily.  Follow-up: EKG in approximately 2 to 3 weeks.  Further follow-up pending EKG and likely cardioversion.  [name], MD 01/17/2018 11:00 AM

## 2018-01-18 ENCOUNTER — Other Ambulatory Visit: Payer: Self-pay | Admitting: *Deleted

## 2018-01-18 ENCOUNTER — Telehealth: Payer: Self-pay | Admitting: *Deleted

## 2018-01-18 ENCOUNTER — Encounter: Payer: Self-pay | Admitting: Internal Medicine

## 2018-01-18 DIAGNOSIS — R7889 Finding of other specified substances, not normally found in blood: Secondary | ICD-10-CM

## 2018-01-18 DIAGNOSIS — Z79899 Other long term (current) drug therapy: Secondary | ICD-10-CM

## 2018-01-18 LAB — DIGOXIN LEVEL: Digoxin, Serum: 1 ng/mL — ABNORMAL HIGH (ref 0.5–0.9)

## 2018-01-18 MED ORDER — DIGOXIN 125 MCG PO TABS: 0.1250 mg | ORAL_TABLET | Freq: Every day | ORAL | 3 refills | Status: DC

## 2018-01-18 NOTE — Telephone Encounter (Signed)
Patient was seen yesterday in the office.

## 2018-01-18 NOTE — Telephone Encounter (Signed)
Received request from pharmacy asking: "Is pt supposed to be taking both pacerone and digoxin interxns together, please review and let us know how to handle."  Amiodarone was discontinued at office visit on 01/03/18 and removed from list.   Attempted to call pharmacy and unable to get through to pharmacist after several rings to speak with pharmacy staff.   Faxed written response back to [facility] at [phone] that patient is off amiodarone as of 01/03/18.

## 2018-01-24 ENCOUNTER — Telehealth: Payer: Self-pay | Admitting: *Deleted

## 2018-01-24 NOTE — Telephone Encounter (Signed)
I called Phillip [name] on his home phone. Phillip [name] tried his home phone number on 01/17/2018 but unsuccessful so she left a vm on his cell on 5/30 and 5/31. Tennis said that we can remove his cell phone number from [facility] since he said "I don't even touch that cell phone for weeks and I only have it for emergencies." I apologized to Phillip [name] for the delay.    Cardiac Rehab orientation appt made for June 20th, 2019 at 11:30am and Phillip [name] was told that it will be a 2 hour appt. I asked Phillip [name] to wear tennis shoes that day and to bring a jacket if he needs it since we leave our gym at 67 degrees. I told Phillip [name] that we will ask him to do a 6 minute walk in the hallway. I asked him to please bring his packet of paperwork filled out with him for the June 20th orientation appt.   I mentioned to Phillip [name] the 3 Cardiac Rehab session times (7:30am M, W, F or 8am T/Th or 4pm on Monday/Wed/Thursday.). Phillip [name] said he will let us know his decision at the orientation appt. We will make a Cardiac Rehab Registered Dietician appt for him based on which class he chooses. (different Registered Dieticians for different CR sessions based on the schedule). Phillip [name] says he has an unusual diet since he drinks 3 cans glucerna day after history 35 radiation treatment to his mouth for tonsil cancer years ago.   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 He said he is very active still and wants to return to playing golf with the help of Cardiac Rehab. History A flutter.   I gave Phillip [name] the direct phone number [phone] if he needed to contact us.

## 2018-02-06 ENCOUNTER — Other Ambulatory Visit: Payer: Self-pay | Admitting: Internal Medicine

## 2018-02-06 ENCOUNTER — Ambulatory Visit (INDEPENDENT_AMBULATORY_CARE_PROVIDER_SITE_OTHER): Payer: PPO | Admitting: *Deleted

## 2018-02-06 ENCOUNTER — Other Ambulatory Visit (INDEPENDENT_AMBULATORY_CARE_PROVIDER_SITE_OTHER): Payer: PPO | Admitting: *Deleted

## 2018-02-06 ENCOUNTER — Ambulatory Visit: Payer: Self-pay | Admitting: Internal Medicine

## 2018-02-06 VITALS — BP 128/70 | HR 70 | Ht 74.0 in | Wt 202.0 lb

## 2018-02-06 DIAGNOSIS — I483 Typical atrial flutter: Secondary | ICD-10-CM | POA: Diagnosis not present

## 2018-02-06 DIAGNOSIS — R7889 Finding of other specified substances, not normally found in blood: Secondary | ICD-10-CM

## 2018-02-06 DIAGNOSIS — Z79899 Other long term (current) drug therapy: Secondary | ICD-10-CM

## 2018-02-06 NOTE — Progress Notes (Signed)
Patient remains in atrial flutter with reasonable heart rate control.  Repeat digoxin level is pending.  He is asymptomatic.  As he has now been on therapeutic anticoagulation with apixaban for a month, we have agreed to proceed with cardioversion next week.  I discussed the procedure, its risks, and benefits with Phillip [name], and he is agreeable to proceed.  I will have him hold digoxin on the night prior to the procedure.  He was advised to continue taking apixaban, including on the morning of cardioversion.  [name], MD [facility] Pager: [phone]

## 2018-02-06 NOTE — Addendum Note (Signed)
Addended by: Alvis Lemmings C on: 02/06/2018 01:26 PM   Modules accepted: Orders

## 2018-02-06 NOTE — Progress Notes (Signed)
1.) Reason for visit: EKG/ BP check  2.) Name of MD requesting visit: End  3.) H&P: The patient was seen on 01/17/18 by Dr. Saunders Revel. He is s/p CABG in 11/2017. He was seen by Dr. Rockey Situ urgently in 12/2017 after a visit in the surgeon's office due to tachycardia. He was found to be in atrial flutter and started on eliquis and digoxin. He saw Dr. Saunders Revel back on 01/17/18 he was in still in atrial flutter. He was instructed to come in today for a repeat EKG to re-evaluate his atrial flutter. In the interim, his digoxin was decreased to 0.125 mg due to a digoxin level of 1.0 on 01/17/18.  4.) ROS related to problem: The patient reports doing well today s/p CABG. He was initially concerned coming in today that he had no appointments scheduled after today. He was also concerned about his BP and the fact that he has researched multiple cuffs and cannot get a good feel for what is most accurate. He brings a wrist cuff in with him today. BP 111/65 (84) on his wrist cuff & 128/70 with our manuel cuff- both taken on the right arm. I advised the patient we would have a plan in place for him prior to him leaving today. He is scheduled to start Cardiac Rehab tomorrow. Meds reviewed and confirmed with the patient.   5.) Assessment and plan per MD: EKG obtained and reviewed with Dr. Saunders Revel- confirmed atrial flutter with controlled rates at 70 bpm. Dr. Saunders Revel discussed DCCV with the patient and he is agreeable. The patient is scheduled for DCCV on Tuesday 02/12/18 at 7:30 am with Dr. Saunders Revel. He is aware of pre-procedure instructions. I have also advised him he is ok to start Cardiac Rehab, per Dr. Saunders Revel, as planned tomorrow. I have also discussed with him that Omron is a good brand of blood pressure monitor. He is aware to take his BP no more than twice daily- once in the AM - at least 30 min-1 hour after his medications & again in the PM. I have advised him not to take it more than that unless he is having symptoms of dizziness, lightheadedness,  pre-syncope, or fast heart rates. The patient voices understanding of the above & is agreeable. He is aware that his follow up is pending his DCCV.

## 2018-02-06 NOTE — Patient Instructions (Signed)
Medication Instructions: - Your physician recommends that you continue on your current medications as directed for now. Please refer to the Current Medication list given to you today.  Labwork: - none ordered  Procedures/Testing: - Your physician has recommended that you have a Cardioversion (DCCV). Electrical Cardioversion uses a jolt of electricity to your heart either through paddles or wired patches attached to your chest. This is a controlled, usually prescheduled, procedure. Defibrillation is done under light anesthesia in the hospital, and you usually go home the day of the procedure. This is done to get your heart back into a normal rhythm. You are not awake for the procedure.  You are scheduled for a Cardioversion on Tuesday 02/12/18 with Dr. Saunders Revel. Please arrive at the [facility] at 6:30 am on the day of your procedure (1st desk on the right to register).  DIET INSTRUCTIONS:  Nothing to eat or drink after midnight except your medications with a sip of water.          1) Labs: n/a  2) Medications:  YOU MAY TAKE ALL of your medications with a small amount of water the morning of your procedure  - HOLD your digoxin dose the night prior to your procedure  3) Must have a responsible person to drive you home.  4) Bring a current list of your medications and current insurance cards.    If you have any questions after you get home, please call the office at [phone]  Follow-Up: - pending your cardioversion  Any Additional Special Instructions Will Be Listed Below (If Applicable).  - You may start Cardiac Rehab tomorrow per Dr. Saunders Revel  - An Omron brand blood pressure monitor is typically what we recommend for our patients  When taking your blood pressure readings, we suggest not to check this more than twice a day- once in the morning about 30 min- 1 hour after you take your morning medications & once in the evening.  You do not need to check this more often during the  day unless you are not feeling well (dizzy, lightheaded, like you might pass out, or if your heart is racing).     If you need a refill on your cardiac medications before your next appointment, please call your pharmacy.    Electrical Cardioversion Electrical cardioversion is the delivery of a jolt of electricity to restore a normal rhythm to the heart. A rhythm that is too fast or is not regular keeps the heart from pumping well. In this procedure, sticky patches or metal paddles are placed on the chest to deliver electricity to the heart from a device. This procedure may be done in an emergency if:  There is low or no blood pressure as a result of the heart rhythm.  Normal rhythm must be restored as fast as possible to protect the brain and heart from further damage.  It may save a life.  This procedure may also be done for irregular or fast heart rhythms that are not immediately life-threatening. Tell a health care provider about:  Any allergies you have.  All medicines you are taking, including vitamins, herbs, eye drops, creams, and over-the-counter medicines.  Any problems you or family members have had with anesthetic medicines.  Any blood disorders you have.  Any surgeries you have had.  Any medical conditions you have.  Whether you are pregnant or may be pregnant. What are the risks? Generally, this is a safe procedure. However, problems may occur, including:  Allergic reactions to medicines.  A blood clot that breaks free and travels to other parts of your body.  The possible return of an abnormal heart rhythm within hours or days after the procedure.  Your heart stopping (cardiac arrest). This is rare.  What happens before the procedure? Medicines  Your health care provider may have you start taking: ? Blood-thinning medicines (anticoagulants) so your blood does not clot as easily. ? Medicines may be given to help stabilize your heart rate and  rhythm.  Ask your health care provider about changing or stopping your regular medicines. This is especially important if you are taking diabetes medicines or blood thinners. General instructions  Plan to have someone take you home from the hospital or clinic.  If you will be going home right after the procedure, plan to have someone with you for 24 hours.  Follow instructions from your health care provider about eating or drinking restrictions. What happens during the procedure?  To lower your risk of infection: ? Your health care team will wash or sanitize their hands. ? Your skin will be washed with soap.  An IV tube will be inserted into one of your veins.  You will be given a medicine to help you relax (sedative).  Sticky patches (electrodes) or metal paddles may be placed on your chest.  An electrical shock will be delivered. The procedure may vary among health care providers and hospitals. What happens after the procedure?  Your blood pressure, heart rate, breathing rate, and blood oxygen level will be monitored until the medicines you were given have worn off.  Do not drive for 24 hours if you were given a sedative.  Your heart rhythm will be watched to make sure it does not change. This information is not intended to replace advice given to you by your health care provider. Make sure you discuss any questions you have with your health care provider. Document Released: 07/28/2002 Document Revised: 04/05/2016 Document Reviewed: 02/11/2016 Elsevier Interactive Patient Education  2017 [employer].

## 2018-02-07 ENCOUNTER — Encounter: Payer: Self-pay | Admitting: *Deleted

## 2018-02-07 ENCOUNTER — Encounter: Payer: PPO | Attending: Internal Medicine | Admitting: *Deleted

## 2018-02-07 VITALS — Ht 73.0 in | Wt 203.2 lb

## 2018-02-07 DIAGNOSIS — Z79899 Other long term (current) drug therapy: Secondary | ICD-10-CM | POA: Insufficient documentation

## 2018-02-07 DIAGNOSIS — I4892 Unspecified atrial flutter: Secondary | ICD-10-CM | POA: Insufficient documentation

## 2018-02-07 DIAGNOSIS — I10 Essential (primary) hypertension: Secondary | ICD-10-CM | POA: Diagnosis not present

## 2018-02-07 DIAGNOSIS — Z7901 Long term (current) use of anticoagulants: Secondary | ICD-10-CM | POA: Diagnosis not present

## 2018-02-07 DIAGNOSIS — E785 Hyperlipidemia, unspecified: Secondary | ICD-10-CM | POA: Diagnosis not present

## 2018-02-07 DIAGNOSIS — Z951 Presence of aortocoronary bypass graft: Secondary | ICD-10-CM | POA: Diagnosis not present

## 2018-02-07 DIAGNOSIS — I2510 Atherosclerotic heart disease of native coronary artery without angina pectoris: Secondary | ICD-10-CM | POA: Insufficient documentation

## 2018-02-07 LAB — BASIC METABOLIC PANEL
BUN / CREAT RATIO: 21 (ref 10–24)
BUN: 16 mg/dL (ref 8–27)
CHLORIDE: 104 mmol/L (ref 96–106)
CO2: 22 mmol/L (ref 20–29)
Calcium: 9.5 mg/dL (ref 8.6–10.2)
Creatinine, Ser: 0.76 mg/dL (ref 0.76–1.27)
GFR, EST AFRICAN AMERICAN: 101 mL/min/{1.73_m2} (ref >59)
GFR, EST NON AFRICAN AMERICAN: 87 mL/min/{1.73_m2} (ref >59)
Glucose: 108 mg/dL — ABNORMAL HIGH (ref 65–99)
POTASSIUM: 4.6 mmol/L (ref 3.5–5.2)
Sodium: 141 mmol/L (ref 134–144)

## 2018-02-07 LAB — DIGOXIN LEVEL: Digoxin, Serum: 0.5 ng/mL (ref 0.5–0.9)

## 2018-02-07 LAB — SPECIMEN STATUS REPORT

## 2018-02-07 NOTE — Patient Instructions (Signed)
Patient Instructions  Patient Details  Name: Phillip [name] MRN: [id number] Date of Birth: February 15, 1941 Referring Provider:  [name], MD  Below are your personal goals for exercise, nutrition, and risk factors. Our goal is to help you stay on track towards obtaining and maintaining these goals. We will be discussing your progress on these goals with you throughout the program.  Initial Exercise Prescription: Initial Exercise Prescription - 02/07/18 1400      Date of Initial Exercise RX and Referring Provider   Date  02/07/18    Referring Provider  end      Treadmill   MPH  2.5    Grade  0.5    Minutes  15    METs  3.09      Recumbant Bike   Level  3    RPM  60    Watts  33    Minutes  15    METs  3.12      REL-XR   Level  3    Speed  50    Minutes  15    METs  3      Prescription Details   Frequency (times per week)  3    Duration  Progress to 30 minutes of continuous aerobic without signs/symptoms of physical distress      Intensity   THRR 40-80% of Max Heartrate  97-127    Ratings of Perceived Exertion  11-13    Perceived Dyspnea  0-4      Resistance Training   Training Prescription  Yes    Weight  4 lb    Reps  10-15       Exercise Goals: Frequency: Be able to perform aerobic exercise two to three times per week in program working toward 2-5 days per week of home exercise.  Intensity: Work with a perceived exertion of 11 (fairly light) - 15 (hard) while following your exercise prescription.  We will make changes to your prescription with you as you progress through the program.   Duration: Be able to do 30 to 45 minutes of continuous aerobic exercise in addition to a 5 minute warm-up and a 5 minute cool-down routine.   Nutrition Goals: Your personal nutrition goals will be established when you do your nutrition analysis with the dietician.  The following are general nutrition guidelines to follow: Cholesterol < 200mg /day Sodium < 1500mg /day Fiber:  Men over 50 yrs - 30 grams per day  Personal Goals: Personal Goals and Risk Factors at Admission - 02/07/18 1435      Core Components/Risk Factors/Patient Goals on Admission   Hypertension  Yes    Intervention  Provide education on lifestyle modifcations including regular physical activity/exercise, weight management, moderate sodium restriction and increased consumption of fresh fruit, vegetables, and low fat dairy, alcohol moderation, and smoking cessation.;Monitor prescription use compliance.    Expected Outcomes  Short Term: Continued assessment and intervention until BP is < 140/14mm HG in hypertensive participants. < 130/79mm HG in hypertensive participants with diabetes, heart failure or chronic kidney disease.;Long Term: Maintenance of blood pressure at goal levels.    Lipids  Yes    Intervention  Provide education and support for participant on nutrition & aerobic/resistive exercise along with prescribed medications to achieve LDL 70mg , HDL >40mg .    Expected Outcomes  Short Term: Participant states understanding of desired cholesterol values and is compliant with medications prescribed. Participant is following exercise prescription and nutrition guidelines.;Long Term: Cholesterol controlled with medications as prescribed, with  individualized exercise RX and with personalized nutrition plan. Value goals: LDL < 70mg , HDL > 40 mg.       Tobacco Use Initial Evaluation: Social History   Tobacco Use  Smoking Status Never Smoker  Smokeless Tobacco Never Used    Exercise Goals and Review: Exercise Goals    Row Name 02/07/18 1405             Exercise Goals   Increase Physical Activity  Yes       Intervention  Provide advice, education, support and counseling about physical activity/exercise needs.;Develop an individualized exercise prescription for aerobic and resistive training based on initial evaluation findings, risk stratification, comorbidities and participant's personal  goals.       Expected Outcomes  Short Term: Attend rehab on a regular basis to increase amount of physical activity.;Long Term: Add in home exercise to make exercise part of routine and to increase amount of physical activity.;Long Term: Exercising regularly at least 3-5 days a week.       Increase Strength and Stamina  Yes       Intervention  Provide advice, education, support and counseling about physical activity/exercise needs.;Develop an individualized exercise prescription for aerobic and resistive training based on initial evaluation findings, risk stratification, comorbidities and participant's personal goals.       Expected Outcomes  Short Term: Increase workloads from initial exercise prescription for resistance, speed, and METs.;Short Term: Perform resistance training exercises routinely during rehab and add in resistance training at home;Long Term: Improve cardiorespiratory fitness, muscular endurance and strength as measured by increased METs and functional capacity (6MWT)       Able to understand and use rate of perceived exertion (RPE) scale  Yes       Intervention  Provide education and explanation on how to use RPE scale       Expected Outcomes  Short Term: Able to use RPE daily in rehab to express subjective intensity level;Long Term:  Able to use RPE to guide intensity level when exercising independently       Able to understand and use Dyspnea scale  Yes       Intervention  Provide education and explanation on how to use Dyspnea scale       Expected Outcomes  Short Term: Able to use Dyspnea scale daily in rehab to express subjective sense of shortness of breath during exertion;Long Term: Able to use Dyspnea scale to guide intensity level when exercising independently       Knowledge and understanding of Target Heart Rate Range (THRR)  Yes       Intervention  Provide education and explanation of THRR including how the numbers were predicted and where they are located for reference        Expected Outcomes  Short Term: Able to state/look up THRR;Short Term: Able to use daily as guideline for intensity in rehab;Long Term: Able to use THRR to govern intensity when exercising independently       Able to check pulse independently  Yes       Intervention  Provide education and demonstration on how to check pulse in carotid and radial arteries.;Review the importance of being able to check your own pulse for safety during independent exercise       Expected Outcomes  Short Term: Able to explain why pulse checking is important during independent exercise;Long Term: Able to check pulse independently and accurately       Understanding of Exercise Prescription  Yes  Intervention  Provide education, explanation, and written materials on patient's individual exercise prescription       Expected Outcomes  Short Term: Able to explain program exercise prescription;Long Term: Able to explain home exercise prescription to exercise independently          Copy of goals given to participant.

## 2018-02-07 NOTE — Progress Notes (Signed)
Daily Session Note  Patient Details  Name: Phillip [name] MRN: [id number] Date of Birth: 12/12/1940 Referring Provider:     Cardiac Rehab from 02/07/2018 in [facility]  Referring Provider  end      Encounter Date: 02/07/2018  Check In: Session Check In - 02/07/18 1429      Check-In   Location  [facility]-Cardiac & Pulmonary Rehab    Staff Present  Renita Papa, RN Vickki Hearing, BA, ACSM CEP, Exercise Physiologist    Supervising physician immediately available to respond to emergencies  See telemetry face sheet for immediately available ER MD    Medication changes reported      No    Fall or balance concerns reported     No    Warm-up and Cool-down  Performed as group-led instruction    Resistance Training Performed  Yes    VAD Patient?  No      Pain Assessment   Currently in Pain?  No/denies        Exercise Prescription Changes - 02/07/18 1400      Response to Exercise   Blood Pressure (Admit)  130/74    Blood Pressure (Exercise)  138/74    Blood Pressure (Exit)  118/64    Heart Rate (Admit)  65 bpm    Heart Rate (Exercise)  122 bpm    Heart Rate (Exit)  56 bpm    Oxygen Saturation (Admit)  95 %    Oxygen Saturation (Exercise)  97 %    Oxygen Saturation (Exit)  96 %    Rating of Perceived Exertion (Exercise)  11       Social History   Tobacco Use  Smoking Status Never Smoker  Smokeless Tobacco Never Used    Goals Met:  Proper associated with RPD/PD & O2 Sat Exercise tolerated well Personal goals reviewed No report of cardiac concerns or symptoms Strength training completed today  Goals Unmet:  Not Applicable  Comments: Med Review completed    Dr. Emily Filbert is Medical Director for [place] [facility] and [facility].

## 2018-02-07 NOTE — Progress Notes (Signed)
Cardiac Individual Treatment Plan  Patient Details  Name: Phillip [name] MRN: [id number] Date of Birth: [birth year] Referring Provider:     Cardiac Rehab from 02/07/2018 in [facility] Cardiac and Pulmonary Rehab  Referring Provider  end      Initial Encounter Date:    Cardiac Rehab from 02/07/2018 in [facility] Cardiac and Pulmonary Rehab  Date  02/07/18  Referring Provider  end      Visit Diagnosis: S/P CABG x 3  Patient's Home Medications on Admission:  Current Outpatient Medications:  .  apixaban (ELIQUIS) 5 MG TABS tablet, Take 1 tablet (5 mg total) by mouth 2 (two) times daily., Disp: 60 tablet, Rfl: 6 .  aspirin EC 81 MG tablet, Take 1 tablet (81 mg total) by mouth daily., Disp: 90 tablet, Rfl: 3 .  B Complex-C (SUPER B COMPLEX PO), Take 1 tablet by mouth daily., Disp: , Rfl:  .  carvedilol (COREG) 12.5 MG tablet, Take 1 tablet (12.5 mg total) by mouth 2 (two) times daily., Disp: 180 tablet, Rfl: 3 .  digoxin (LANOXIN) 0.125 MG tablet, Take 1 tablet (0.125 mg total) by mouth daily., Disp: 30 tablet, Rfl: 3 .  Docusate Sodium (STOOL SOFTENER) 100 MG capsule, Take 350 mg by mouth daily. , Disp: , Rfl:  .  furosemide (LASIX) 20 MG tablet, Take 1 tablet (20 mg total) by mouth daily., Disp: 90 tablet, Rfl: 3 .  Melatonin 10 MG TABS, Take by mouth. Take 1 tablet (10 mg) by mouth once daily as needed, Disp: , Rfl:  .  mirabegron ER (MYRBETRIQ) 25 MG TB24 tablet, Take 25 mg by mouth daily., Disp: , Rfl:  .  Multiple Vitamin (MULTIVITAMIN) tablet, Take 1 tablet by mouth daily., Disp: , Rfl:  .  ondansetron (ZOFRAN ODT) 4 MG disintegrating tablet, Take 1 tablet (4 mg total) by mouth every 8 (eight) hours as needed for nausea or vomiting., Disp: 20 tablet, Rfl: 0 .  POTASSIUM GLUCONATE PO, Take 198 mg by mouth daily. , Disp: , Rfl:  .  rosuvastatin (CRESTOR) 20 MG tablet, Take 1 tablet (20 mg total) by mouth daily., Disp: 90 tablet, Rfl: 3 .  zaleplon (SONATA) 10 MG capsule, Take 1 capsule (10 mg total)  by mouth at bedtime as needed for sleep., Disp: 30 capsule, Rfl: 0  Past Medical History: Past Medical History:  Diagnosis Date  . Anxiety   . Atrial flutter ([place]) 01/07/2018   Post-op after CABG  . Coronary artery disease 2003   a. nonobstructive CAD by cath in 2003 and 2005; b.  Status post three-vessel CABG on 12/12/2017 with LIMA to LAD, sequential reverse SVG to OM1 and distal LCx  . Hearing loss    bilateral  . History of benign prostatic hypertrophy   . History of radiation therapy 01/31/10-03/22/10   right tonsil/right neck node 7000 cGy 35 sessions, high risk lymph node volume 5940 cGy 35 sessions, low risk lymph node vol 5600 cGy 35 sessions  . 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 HTN (hypertension)   . Hyperlipidemia    diet controlled- taking medication d/t ensure drinking  . Hypothyroid 01/16/2013  . Ischemic cardiomyopathy    a. 10/2017: echo showing reduced EF of 40-45%, HK of the anterior, anteroseptal and apical myocardium with Grade 1 DD.   Marland Kitchen Neck pain   . Squamous cell carcinoma    right tonsil- 2011  . Thrombocytopenia ([place]) 02/02/2012   unclear etiology  . Tinnitus     Tobacco  Use: Social History   Tobacco Use  Smoking Status Never Smoker  Smokeless Tobacco Never Used    Labs: Recent Review Flowsheet Data    Labs for ITP Cardiac and Pulmonary Rehab Latest Ref Rng & Units 12/12/2017 12/12/2017 12/13/2017 12/13/2017 12/14/2017   Cholestrol 0 - 200 mg/dL - - - - -   LDLCALC 0 - 99 mg/dL - - - - -   LDLDIRECT mg/dL - - - - -   HDL >39.00 mg/dL - - - - -   Trlycerides 0.0 - 149.0 mg/dL - - - - -   Hemoglobin A1c 4.6 - 6.5 % - - - - -   PHART 7.350 - 7.450 - 7.365 - - -   PCO2ART 32.0 - 48.0 mmHg - 36.7 - - -   HCO3 20.0 - 28.0 mmol/L - 21.1 - - -   TCO2 22 - 32 mmol/L '24 22 24 23 ' -   ACIDBASEDEF 0.0 - 2.0 mmol/L - 4.0(H) - - -   O2SAT % - 95.0 - - 63.3       Exercise Target Goals: Date: 02/07/18  Exercise Program Goal: Individual exercise prescription set  using results from initial 6 min walk test and THRR while considering  patient's activity barriers and safety.   Exercise Prescription Goal: Initial exercise prescription builds to 30-45 minutes a day of aerobic activity, 2-3 days per week.  Home exercise guidelines will be given to patient during program as part of exercise prescription that the participant will acknowledge.  Activity Barriers & Risk Stratification: Activity Barriers & Cardiac Risk Stratification - 02/07/18 1442      Activity Barriers & Cardiac Risk Stratification   Activity Barriers  Incisional Pain    Cardiac Risk Stratification  High       6 Minute Walk: 6 Minute Walk    Row Name 02/07/18 1406         6 Minute Walk   Distance  1467 feet     Walk Time  6 minutes     # of Rest Breaks  0     MPH  2.78     METS  3.12     RPE  11     Perceived Dyspnea   0     VO2 Peak  10.92     Symptoms  No     Resting HR  65 bpm     Resting BP  130/73     Resting Oxygen Saturation   95 %     Exercise Oxygen Saturation  during 6 min walk  96 %     Max Ex. HR  122 bpm     Max Ex. BP  138/74     2 Minute Post BP  118/64        Oxygen Initial Assessment:   Oxygen Re-Evaluation:   Oxygen Discharge (Final Oxygen Re-Evaluation):   Initial Exercise Prescription: Initial Exercise Prescription - 02/07/18 1400      Date of Initial Exercise RX and Referring Provider   Date  02/07/18    Referring Provider  end      Treadmill   MPH  2.5    Grade  0.5    Minutes  15    METs  3.09      Recumbant Bike   Level  3    RPM  60    Watts  33    Minutes  15    METs  3.12  REL-XR   Level  3    Speed  50    Minutes  15    METs  3      Prescription Details   Frequency (times per week)  3    Duration  Progress to 30 minutes of continuous aerobic without signs/symptoms of physical distress      Intensity   THRR 40-80% of Max Heartrate  97-127    Ratings of Perceived Exertion  11-13    Perceived Dyspnea  0-4       Resistance Training   Training Prescription  Yes    Weight  4 lb    Reps  10-15       Perform Capillary Blood Glucose checks as needed.  Exercise Prescription Changes: Exercise Prescription Changes    Row Name 02/07/18 1400             Response to Exercise   Blood Pressure (Admit)  130/74       Blood Pressure (Exercise)  138/74       Blood Pressure (Exit)  118/64       Heart Rate (Admit)  65 bpm       Heart Rate (Exercise)  122 bpm       Heart Rate (Exit)  56 bpm       Oxygen Saturation (Admit)  95 %       Oxygen Saturation (Exercise)  97 %       Oxygen Saturation (Exit)  96 %       Rating of Perceived Exertion (Exercise)  11          Exercise Comments:   Exercise Goals and Review: Exercise Goals    Row Name 02/07/18 1405             Exercise Goals   Increase Physical Activity  Yes       Intervention  Provide advice, education, support and counseling about physical activity/exercise needs.;Develop an individualized exercise prescription for aerobic and resistive training based on initial evaluation findings, risk stratification, comorbidities and participant's personal goals.       Expected Outcomes  Short Term: Attend rehab on a regular basis to increase amount of physical activity.;Long Term: Add in home exercise to make exercise part of routine and to increase amount of physical activity.;Long Term: Exercising regularly at least 3-5 days a week.       Increase Strength and Stamina  Yes       Intervention  Provide advice, education, support and counseling about physical activity/exercise needs.;Develop an individualized exercise prescription for aerobic and resistive training based on initial evaluation findings, risk stratification, comorbidities and participant's personal goals.       Expected Outcomes  Short Term: Increase workloads from initial exercise prescription for resistance, speed, and METs.;Short Term: Perform resistance training exercises routinely  during rehab and add in resistance training at home;Long Term: Improve cardiorespiratory fitness, muscular endurance and strength as measured by increased METs and functional capacity (6MWT)       Able to understand and use rate of perceived exertion (RPE) scale  Yes       Intervention  Provide education and explanation on how to use RPE scale       Expected Outcomes  Short Term: Able to use RPE daily in rehab to express subjective intensity level;Long Term:  Able to use RPE to guide intensity level when exercising independently       Able to understand and use Dyspnea scale  Yes  Intervention  Provide education and explanation on how to use Dyspnea scale       Expected Outcomes  Short Term: Able to use Dyspnea scale daily in rehab to express subjective sense of shortness of breath during exertion;Long Term: Able to use Dyspnea scale to guide intensity level when exercising independently       Knowledge and understanding of Target Heart Rate Range (THRR)  Yes       Intervention  Provide education and explanation of THRR including how the numbers were predicted and where they are located for reference       Expected Outcomes  Short Term: Able to state/look up THRR;Short Term: Able to use daily as guideline for intensity in rehab;Long Term: Able to use THRR to govern intensity when exercising independently       Able to check pulse independently  Yes       Intervention  Provide education and demonstration on how to check pulse in carotid and radial arteries.;Review the importance of being able to check your own pulse for safety during independent exercise       Expected Outcomes  Short Term: Able to explain why pulse checking is important during independent exercise;Long Term: Able to check pulse independently and accurately       Understanding of Exercise Prescription  Yes       Intervention  Provide education, explanation, and written materials on patient's individual exercise prescription        Expected Outcomes  Short Term: Able to explain program exercise prescription;Long Term: Able to explain home exercise prescription to exercise independently          Exercise Goals Re-Evaluation :   Discharge Exercise Prescription (Final Exercise Prescription Changes): Exercise Prescription Changes - 02/07/18 1400      Response to Exercise   Blood Pressure (Admit)  130/74    Blood Pressure (Exercise)  138/74    Blood Pressure (Exit)  118/64    Heart Rate (Admit)  65 bpm    Heart Rate (Exercise)  122 bpm    Heart Rate (Exit)  56 bpm    Oxygen Saturation (Admit)  95 %    Oxygen Saturation (Exercise)  97 %    Oxygen Saturation (Exit)  96 %    Rating of Perceived Exertion (Exercise)  11       Nutrition:  Target Goals: Understanding of nutrition guidelines, daily intake of sodium <1540m, cholesterol <2081m calories 30% from fat and 7% or less from saturated fats, daily to have 5 or more servings of fruits and vegetables.  Biometrics: Pre Biometrics - 02/07/18 1405      Pre Biometrics   Height  '6\' 1"'  (1.854 m)    Weight  203 lb 3.2 oz (92.2 kg)    Waist Circumference  39 inches    Hip Circumference  43 inches    Waist to Hip Ratio  0.91 %    BMI (Calculated)  26.81    Single Leg Stand  2.78 seconds        Nutrition Therapy Plan and Nutrition Goals: Nutrition Therapy & Goals - 02/07/18 1436      Intervention Plan   Intervention  Prescribe, educate and counsel regarding individualized specific dietary modifications aiming towards targeted core components such as weight, hypertension, lipid management, diabetes, heart failure and other comorbidities.;Nutrition handout(s) given to patient.    Expected Outcomes  Short Term Goal: Understand basic principles of dietary content, such as calories, fat, sodium, cholesterol and nutrients.;Short  Term Goal: A plan has been developed with personal nutrition goals set during dietitian appointment.;Long Term Goal: Adherence to prescribed  nutrition plan.       Nutrition Assessments: Nutrition Assessments - 02/07/18 1437      MEDFICTS Scores   Pre Score  27       Nutrition Goals Re-Evaluation:   Nutrition Goals Discharge (Final Nutrition Goals Re-Evaluation):   Psychosocial: Target Goals: Acknowledge presence or absence of significant depression and/or stress, maximize coping skills, provide positive support system. Participant is able to verbalize types and ability to use techniques and skills needed for reducing stress and depression.   Initial Review & Psychosocial Screening: Initial Psych Review & Screening - 02/07/18 1439      Initial Review   Current issues with  Current Stress Concerns;Current Sleep Concerns wakes up every 1.5 hrs to use bathroom, but is able to fall back to sleep easily    Source of Stress Concerns  Unable to perform yard/household activities;Unable to participate in former interests or hobbies      [place]?  Yes Wife, family, friends, church, [employer]      Barriers   Psychosocial barriers to participate in program  There are no identifiable barriers or psychosocial needs.;The patient should benefit from training in stress management and relaxation.      Screening Interventions   Interventions  Encouraged to exercise;Provide feedback about the scores to participant;Program counselor consult;To provide support and resources with identified psychosocial needs    Expected Outcomes  Short Term goal: Utilizing psychosocial counselor, staff and physician to assist with identification of specific Stressors or current issues interfering with healing process. Setting desired goal for each stressor or current issue identified.;Long Term Goal: Stressors or current issues are controlled or eliminated.;Short Term goal: Identification and review with participant of any Quality of Life or Depression concerns found by scoring the questionnaire.;Long Term goal: The  participant improves quality of Life and PHQ9 Scores as seen by post scores and/or verbalization of changes       Quality of Life Scores:  Quality of Life - 02/07/18 1440      Quality of Life Scores   Health/Function Pre  27.3 %    Socioeconomic Pre  27 %    Psych/Spiritual Pre  30 %    Family Pre  27.6 %    GLOBAL Pre  27.86 %      Scores of 19 and below usually indicate a poorer quality of life in these areas.  A difference of  2-3 points is a clinically meaningful difference.  A difference of 2-3 points in the total score of the Quality of Life Index has been associated with significant improvement in overall quality of life, self-image, physical symptoms, and general health in studies assessing change in quality of life.  PHQ-9: Recent Review Flowsheet Data    Depression screen [facility] 2/9 02/07/2018 11/07/2017 12/18/2016 05/29/2016 10/21/2015   Decreased Interest 0 0 - 0 0   Down, Depressed, Hopeless 0 0 0 0 0   PHQ - 2 Score 0 0 0 0 0   Altered sleeping 0 0 - - -   Tired, decreased energy 1 0 - - -   Change in appetite 3 0 - - -   Feeling bad or failure about yourself  0 0 - - -   Trouble concentrating 0 0 - - -   Moving slowly or fidgety/restless 0 0 - - -  Suicidal thoughts 0 0 - - -   PHQ-9 Score 4 0 - - -   Difficult doing work/chores Not difficult at all Not difficult at all - - -     Interpretation of Total Score  Total Score Depression Severity:  1-4 = Minimal depression, 5-9 = Mild depression, 10-14 = Moderate depression, 15-19 = Moderately severe depression, 20-27 = Severe depression   Psychosocial Evaluation and Intervention:   Psychosocial Re-Evaluation:   Psychosocial Discharge (Final Psychosocial Re-Evaluation):   Vocational Rehabilitation: Provide vocational rehab assistance to qualifying candidates.   Vocational Rehab Evaluation & Intervention: Vocational Rehab - 02/07/18 1441      Initial Vocational Rehab Evaluation & Intervention   Assessment shows  need for Vocational Rehabilitation  No       Education: Education Goals: Education classes will be provided on a variety of topics geared toward better understanding of heart health and risk factor modification. Participant will state understanding/return demonstration of topics presented as noted by education test scores.  Learning Barriers/Preferences: Learning Barriers/Preferences - 02/07/18 1441      Learning Barriers/Preferences   Learning Barriers  Exercise Concerns    Learning Preferences  None       Education Topics:  AED/CPR: - Group verbal and written instruction with the use of models to demonstrate the basic use of the AED with the basic ABC's of resuscitation.   General Nutrition Guidelines/Fats and Fiber: -Group instruction provided by verbal, written material, models and posters to present the general guidelines for heart healthy nutrition. Gives an explanation and review of dietary fats and fiber.   Controlling Sodium/Reading Food Labels: -Group verbal and written material supporting the discussion of sodium use in heart healthy nutrition. Review and explanation with models, verbal and written materials for utilization of the food label.   Exercise Physiology & General Exercise Guidelines: - Group verbal and written instruction with models to review the exercise physiology of the cardiovascular system and associated critical values. Provides general exercise guidelines with specific guidelines to those with heart or lung disease.    Aerobic Exercise & Resistance Training: - Gives group verbal and written instruction on the various components of exercise. Focuses on aerobic and resistive training programs and the benefits of this training and how to safely progress through these programs..   Flexibility, Balance, Mind/Body Relaxation: Provides group verbal/written instruction on the benefits of flexibility and balance training, including mind/body exercise modes  such as yoga, pilates and tai chi.  Demonstration and skill practice provided.   Stress and Anxiety: - Provides group verbal and written instruction about the health risks of elevated stress and causes of high stress.  Discuss the correlation between heart/lung disease and anxiety and treatment options. Review healthy ways to manage with stress and anxiety.   Depression: - Provides group verbal and written instruction on the correlation between heart/lung disease and depressed mood, treatment options, and the stigmas associated with seeking treatment.   Anatomy & Physiology of the Heart: - Group verbal and written instruction and models provide basic cardiac anatomy and physiology, with the coronary electrical and arterial systems. Review of Valvular disease and Heart Failure   Cardiac Procedures: - Group verbal and written instruction to review commonly prescribed medications for heart disease. Reviews the medication, class of the drug, and side effects. Includes the steps to properly store meds and maintain the prescription regimen. (beta blockers and nitrates)   Cardiac Medications I: - Group verbal and written instruction to review commonly prescribed medications for  heart disease. Reviews the medication, class of the drug, and side effects. Includes the steps to properly store meds and maintain the prescription regimen.   Cardiac Medications II: -Group verbal and written instruction to review commonly prescribed medications for heart disease. Reviews the medication, class of the drug, and side effects. (all other drug classes)    Go Sex-Intimacy & Heart Disease, Get SMART - Goal Setting: - Group verbal and written instruction through game format to discuss heart disease and the return to sexual intimacy. Provides group verbal and written material to discuss and apply goal setting through the application of the S.M.A.R.T. Method.   Other Matters of the Heart: - Provides group  verbal, written materials and models to describe Stable Angina and Peripheral Artery. Includes description of the disease process and treatment options available to the cardiac patient.   Exercise & Equipment Safety: - Individual verbal instruction and demonstration of equipment use and safety with use of the equipment.   Cardiac Rehab from 02/07/2018 in [facility] Cardiac and Pulmonary Rehab  Date  02/07/18  Educator  [facility]  Instruction Review Code  1- Verbalizes Understanding      Infection Prevention: - Provides verbal and written material to individual with discussion of infection control including proper hand washing and proper equipment cleaning during exercise session.   Cardiac Rehab from 02/07/2018 in [facility] Cardiac and Pulmonary Rehab  Date  02/07/18  Educator  [facility]  Instruction Review Code  1- Verbalizes Understanding      Falls Prevention: - Provides verbal and written material to individual with discussion of falls prevention and safety.   Cardiac Rehab from 02/07/2018 in [facility] Cardiac and Pulmonary Rehab  Date  02/07/18  Educator  [facility]  Instruction Review Code  1- Verbalizes Understanding      Diabetes: - Individual verbal and written instruction to review signs/symptoms of diabetes, desired ranges of glucose level fasting, after meals and with exercise. Acknowledge that pre and post exercise glucose checks will be done for 3 sessions at entry of program.   Know Your Numbers and Risk Factors: -Group verbal and written instruction about important numbers in your health.  Discussion of what are risk factors and how they play a role in the disease process.  Review of Cholesterol, Blood Pressure, Diabetes, and BMI and the role they play in your overall health.   Sleep Hygiene: -Provides group verbal and written instruction about how sleep can affect your health.  Define sleep hygiene, discuss sleep cycles and impact of sleep habits. Review good sleep hygiene tips.     Other: -Provides group and verbal instruction on various topics (see comments)   Knowledge Questionnaire Score: Knowledge Questionnaire Score - 02/07/18 1441      Knowledge Questionnaire Score   Pre Score  24/26 correct answers reviewed with Fara Olden       Core Components/Risk Factors/Patient Goals at Admission: Personal Goals and Risk Factors at Admission - 02/07/18 1435      Core Components/Risk Factors/Patient Goals on Admission   Hypertension  Yes    Intervention  Provide education on lifestyle modifcations including regular physical activity/exercise, weight management, moderate sodium restriction and increased consumption of fresh fruit, vegetables, and low fat dairy, alcohol moderation, and smoking cessation.;Monitor prescription use compliance.    Expected Outcomes  Short Term: Continued assessment and intervention until BP is < 140/68m HG in hypertensive participants. < 130/837mHG in hypertensive participants with diabetes, heart failure or chronic kidney disease.;Long Term: Maintenance of blood pressure at goal  levels.    Lipids  Yes    Intervention  Provide education and support for participant on nutrition & aerobic/resistive exercise along with prescribed medications to achieve LDL <5m, HDL >413m    Expected Outcomes  Short Term: Participant states understanding of desired cholesterol values and is compliant with medications prescribed. Participant is following exercise prescription and nutrition guidelines.;Long Term: Cholesterol controlled with medications as prescribed, with individualized exercise RX and with personalized nutrition plan. Value goals: LDL < 7040mHDL > 40 mg.       Core Components/Risk Factors/Patient Goals Review:    Core Components/Risk Factors/Patient Goals at Discharge (Final Review):    ITP Comments: ITP Comments    Row Name 02/07/18 1430           ITP Comments  Med review completed. Initial ITP created. [facility]          Comments: Initial ITP

## 2018-02-10 ENCOUNTER — Encounter: Payer: Self-pay | Admitting: Internal Medicine

## 2018-02-12 ENCOUNTER — Telehealth: Payer: Self-pay | Admitting: Internal Medicine

## 2018-02-12 ENCOUNTER — Other Ambulatory Visit: Payer: Self-pay

## 2018-02-12 ENCOUNTER — Other Ambulatory Visit: Payer: Self-pay | Admitting: Cardiothoracic Surgery

## 2018-02-12 ENCOUNTER — Ambulatory Visit: Payer: PPO | Admitting: Anesthesiology

## 2018-02-12 ENCOUNTER — Ambulatory Visit
Admission: RE | Admit: 2018-02-12 | Discharge: 2018-02-12 | Disposition: A | Discharge status: Home / Self Care | Payer: PPO | Source: Ambulatory Visit | Attending: Internal Medicine | Admitting: Internal Medicine

## 2018-02-12 ENCOUNTER — Encounter
Admission: RE | Disposition: A | Discharge status: Home / Self Care | Payer: Self-pay | Source: Ambulatory Visit | Attending: Internal Medicine

## 2018-02-12 DIAGNOSIS — I10 Essential (primary) hypertension: Secondary | ICD-10-CM | POA: Diagnosis not present

## 2018-02-12 DIAGNOSIS — Z85818 Personal history of malignant neoplasm of other sites of lip, oral cavity, and pharynx: Secondary | ICD-10-CM | POA: Insufficient documentation

## 2018-02-12 DIAGNOSIS — I4892 Unspecified atrial flutter: Secondary | ICD-10-CM | POA: Diagnosis not present

## 2018-02-12 DIAGNOSIS — Z951 Presence of aortocoronary bypass graft: Secondary | ICD-10-CM | POA: Diagnosis not present

## 2018-02-12 DIAGNOSIS — Z885 Allergy status to narcotic agent status: Secondary | ICD-10-CM | POA: Diagnosis not present

## 2018-02-12 DIAGNOSIS — Z823 Family history of stroke: Secondary | ICD-10-CM | POA: Diagnosis not present

## 2018-02-12 DIAGNOSIS — I255 Ischemic cardiomyopathy: Secondary | ICD-10-CM | POA: Insufficient documentation

## 2018-02-12 DIAGNOSIS — I4891 Unspecified atrial fibrillation: Secondary | ICD-10-CM | POA: Diagnosis not present

## 2018-02-12 DIAGNOSIS — Z7901 Long term (current) use of anticoagulants: Secondary | ICD-10-CM | POA: Insufficient documentation

## 2018-02-12 DIAGNOSIS — H9193 Unspecified hearing loss, bilateral: Secondary | ICD-10-CM | POA: Insufficient documentation

## 2018-02-12 DIAGNOSIS — Z923 Personal history of irradiation: Secondary | ICD-10-CM | POA: Diagnosis not present

## 2018-02-12 DIAGNOSIS — I2510 Atherosclerotic heart disease of native coronary artery without angina pectoris: Secondary | ICD-10-CM | POA: Diagnosis not present

## 2018-02-12 DIAGNOSIS — Z955 Presence of coronary angioplasty implant and graft: Secondary | ICD-10-CM | POA: Diagnosis not present

## 2018-02-12 DIAGNOSIS — Z8249 Family history of ischemic heart disease and other diseases of the circulatory system: Secondary | ICD-10-CM | POA: Diagnosis not present

## 2018-02-12 DIAGNOSIS — H9319 Tinnitus, unspecified ear: Secondary | ICD-10-CM | POA: Insufficient documentation

## 2018-02-12 DIAGNOSIS — Z79899 Other long term (current) drug therapy: Secondary | ICD-10-CM | POA: Diagnosis not present

## 2018-02-12 DIAGNOSIS — D696 Thrombocytopenia, unspecified: Secondary | ICD-10-CM | POA: Diagnosis not present

## 2018-02-12 DIAGNOSIS — N400 Enlarged prostate without lower urinary tract symptoms: Secondary | ICD-10-CM | POA: Insufficient documentation

## 2018-02-12 DIAGNOSIS — F419 Anxiety disorder, unspecified: Secondary | ICD-10-CM | POA: Diagnosis not present

## 2018-02-12 DIAGNOSIS — Z888 Allergy status to other drugs, medicaments and biological substances status: Secondary | ICD-10-CM | POA: Diagnosis not present

## 2018-02-12 DIAGNOSIS — E785 Hyperlipidemia, unspecified: Secondary | ICD-10-CM | POA: Diagnosis not present

## 2018-02-12 DIAGNOSIS — E039 Hypothyroidism, unspecified: Secondary | ICD-10-CM | POA: Diagnosis not present

## 2018-02-12 HISTORY — PX: CARDIOVERSION: EP1203

## 2018-02-12 SURGERY — CARDIOVERSION (CATH LAB)
Anesthesia: General

## 2018-02-12 MED ORDER — FENTANYL CITRATE (PF) 100 MCG/2ML IJ SOLN: 25.0000 ug | INTRAMUSCULAR | Status: DC | PRN

## 2018-02-12 MED ORDER — CARVEDILOL 12.5 MG PO TABS: 6.2500 mg | ORAL_TABLET | Freq: Two times a day (BID) | ORAL | 3 refills | Status: DC

## 2018-02-12 MED ORDER — CARVEDILOL 6.25 MG PO TABS: 6.2500 mg | ORAL_TABLET | Freq: Two times a day (BID) | ORAL | 3 refills | Status: DC

## 2018-02-12 MED ORDER — ONDANSETRON HCL 4 MG/2ML IJ SOLN: 4.0000 mg | Freq: Once | INTRAMUSCULAR | Status: DC | PRN

## 2018-02-12 MED ORDER — PROPOFOL 10 MG/ML IV BOLUS
INTRAVENOUS | Status: DC | PRN
  Administered 2018-02-12 (×2): 20 mg via INTRAVENOUS
  Administered 2018-02-12: 30 mg via INTRAVENOUS

## 2018-02-12 MED ORDER — PROPOFOL 10 MG/ML IV BOLUS
INTRAVENOUS | Status: AC
  Filled 2018-02-12: qty 20

## 2018-02-12 MED ORDER — SODIUM CHLORIDE 0.9 % IV SOLN
INTRAVENOUS | Status: DC | PRN
  Administered 2018-02-12: 07:00:00 via INTRAVENOUS

## 2018-02-12 NOTE — Telephone Encounter (Signed)
°*  STAT* If patient is at the pharmacy, call can be transferred to refill team.   1. Which medications need to be refilled? (please list name of each medication and dose if known)   Carvedilol 6.25 mg po BID  2. Which pharmacy/location (including street and city if local pharmacy) is medication to be sent to?[facility] [address] [place]   3. Do they need a 30 day or 90 day supply? 61   Per Coralyn Mark RN specials patient dose was decreased and he doesn't want to cut the pills he already has.  Please send in new rx.

## 2018-02-12 NOTE — Anesthesia Procedure Notes (Signed)
Date/Time: 02/12/2018 7:27 AM Performed by: Johnna Acosta, CRNA Pre-anesthesia Checklist: Patient identified, Emergency Drugs available, Suction available, Patient being monitored and Timeout performed Patient Re-evaluated:Patient Re-evaluated prior to induction Oxygen Delivery Method: Nasal cannula Preoxygenation: Pre-oxygenation with 100% oxygen

## 2018-02-12 NOTE — Anesthesia Postprocedure Evaluation (Signed)
Anesthesia Post Note  Patient: Makani Seckman  Procedure(s) Performed: CARDIOVERSION (N/A )  Patient location during evaluation: PACU Anesthesia Type: General Level of consciousness: awake and alert and oriented Pain management: pain level controlled Vital Signs Assessment: post-procedure vital signs reviewed and stable Respiratory status: spontaneous breathing Cardiovascular status: blood pressure returned to baseline Anesthetic complications: no     Last Vitals:  Vitals:   02/12/18 0800 02/12/18 0815  BP: (!) 153/77 132/83  Pulse: (!) 54 (!) 58  Resp: 18 17  Temp:    SpO2: 97% 95%    Last Pain:  Vitals:   02/12/18 0815  TempSrc:   PainSc: 0-No pain                 Amiah Frohlich

## 2018-02-12 NOTE — Anesthesia Preprocedure Evaluation (Signed)
Anesthesia Evaluation  Patient identified by MRN, date of birth, ID band Patient awake    Reviewed: Allergy & Precautions, NPO status , Patient's Chart, lab work & pertinent test results  Airway Mallampati: II  TM Distance: >3 FB Neck ROM: Full    Dental no notable dental hx.    Pulmonary neg pulmonary ROS,    Pulmonary exam normal breath sounds clear to auscultation       Cardiovascular hypertension, + angina + CAD and + Peripheral Vascular Disease  Normal cardiovascular exam+ dysrhythmias + Valvular Problems/Murmurs  Rhythm:Regular Rate:Normal     Neuro/Psych PSYCHIATRIC DISORDERS Anxiety negative neurological ROS  negative psych ROS   GI/Hepatic negative GI ROS, Neg liver ROS,   Endo/Other  negative endocrine ROSHypothyroidism   Renal/GU negative Renal ROS  negative genitourinary   Musculoskeletal negative musculoskeletal ROS (+)   Abdominal   Peds negative pediatric ROS (+)  Hematology negative hematology ROS (+)   Anesthesia Other Findings   Reproductive/Obstetrics                             Anesthesia Physical  Anesthesia Plan  ASA: III  Anesthesia Plan: General   Post-op Pain Management:    Induction: Intravenous  PONV Risk Score and Plan:   Airway Management Planned: Nasal Cannula  Additional Equipment:   Intra-op Plan:   Post-operative Plan:   Informed Consent: I have reviewed the patients History and Physical, chart, labs and discussed the procedure including the risks, benefits and alternatives for the proposed anesthesia with the patient or authorized representative who has indicated his/her understanding and acceptance.   Dental advisory given  Plan Discussed with: CRNA  Anesthesia Plan Comments:         Anesthesia Quick Evaluation

## 2018-02-12 NOTE — Anesthesia Post-op Follow-up Note (Signed)
Anesthesia QCDR form completed.        

## 2018-02-12 NOTE — Telephone Encounter (Signed)
Please review for refill. Thanks!  

## 2018-02-12 NOTE — Transfer of Care (Signed)
Immediate Anesthesia Transfer of Care Note  Patient: Phillip [name]  Procedure(s) Performed: CARDIOVERSION (N/A )  Patient Location: PACU  Anesthesia Type:General  Level of Consciousness: awake and alert   Airway & Oxygen Therapy: Patient Spontanous Breathing and Patient connected to nasal cannula oxygen  Post-op Assessment: Report given to RN and Post -op Vital signs reviewed and stable  Post vital signs: Reviewed and stable  Last Vitals:  Vitals Value Taken Time  BP 110/59 02/12/2018  7:44 AM  Temp    Pulse 49 02/12/2018  7:45 AM  Resp 17 02/12/2018  7:45 AM  SpO2 96 % 02/12/2018  7:45 AM  Vitals shown include unvalidated device data.  Last Pain:  Vitals:   02/12/18 0704  TempSrc: Oral  PainSc: 0-No pain         Complications: No apparent anesthesia complications

## 2018-02-12 NOTE — Interval H&P Note (Signed)
History and Physical Interval Note:  02/12/2018 7:16 AM  Phillip [name]  has presented today for cardioversion, with the diagnosis of atrial flutter. The various methods of treatment have been discussed with the patient and family. After consideration of risks, benefits and other options for treatment, the patient has consented to  Procedure(s): CARDIOVERSION (N/A) as a surgical intervention .  [name] history has been reviewed, patient examined, no change in status, stable for surgery.  I have reviewed the patient's chart and labs.  Questions were answered to the patient's satisfaction.     Jessika Rothery

## 2018-02-12 NOTE — CV Procedure (Signed)
    Cardioversion Note  Phillip [name] [id number] Sep 15, 1940  Procedure: DC Cardioversion Indications: Atrial flutter  Procedure Details Consent: Obtained Time Out: Verified patient identification, verified procedure, site/side was marked, verified correct patient position, special equipment/implants available, Radiology Safety Procedures followed,  medications/allergies/relevent history reviewed, required imaging and test results available.  Performed  The patient has been on adequate anticoagulation.  The patient received IV propofol by anesthesia for sedation.  Synchronous cardioversion was performed at 120 joules x 1.  The cardioversion was successful with transient junctional rhythm followed by sinus bradycardia.  Complications: No apparent complications Patient did tolerate procedure well.   [name]., MD 02/12/2018, 7:43 AM

## 2018-02-12 NOTE — Telephone Encounter (Signed)
Patient requested carvedilol 6.25 mg tablet instead of the 12.5 mg tablet. I sent in Rx for carvedilol 6.25 mg tablet by mouth two times a day.

## 2018-02-13 ENCOUNTER — Encounter: Payer: Self-pay | Admitting: Internal Medicine

## 2018-02-14 ENCOUNTER — Encounter: Payer: Self-pay | Admitting: Cardiothoracic Surgery

## 2018-02-15 ENCOUNTER — Other Ambulatory Visit: Payer: Self-pay

## 2018-02-15 ENCOUNTER — Encounter: Payer: Self-pay | Admitting: Cardiothoracic Surgery

## 2018-02-15 ENCOUNTER — Ambulatory Visit (INDEPENDENT_AMBULATORY_CARE_PROVIDER_SITE_OTHER): Payer: Self-pay | Admitting: Cardiothoracic Surgery

## 2018-02-15 ENCOUNTER — Ambulatory Visit
Admission: RE | Admit: 2018-02-15 | Discharge: 2018-02-15 | Disposition: A | Discharge status: Home / Self Care | Payer: PPO | Source: Ambulatory Visit | Attending: Cardiothoracic Surgery | Admitting: Cardiothoracic Surgery

## 2018-02-15 VITALS — BP 176/81 | HR 52 | Resp 20 | Ht 74.0 in | Wt 209.0 lb

## 2018-02-15 DIAGNOSIS — Z951 Presence of aortocoronary bypass graft: Secondary | ICD-10-CM | POA: Diagnosis not present

## 2018-02-15 DIAGNOSIS — J9811 Atelectasis: Secondary | ICD-10-CM | POA: Diagnosis not present

## 2018-02-15 NOTE — Progress Notes (Signed)
[address]       [place] [address]             [phone]      Phillip [name] [facility] Medical Record #[id number] Date of Birth: 04/05/1941  Referring: Phillip Bush, MD Primary Care: Phillip Bush, MD Primary Cardiologist: Phillip Bush, MD   Chief Complaint:   POST OP FOLLOW UP OPERATIVE REPORT DATE OF PROCEDURE:  12/12/2017 PREOPERATIVE DIAGNOSIS:  Coronary occlusive disease with depressed left ventricular function, left main equivalent. POSTOPERATIVE DIAGNOSIS:  Coronary occlusive disease with depressed left ventricular function, left main equivalent. SURGICAL PROCEDURE:  Coronary artery bypass grafting x3 with the left internal mammary to the left anterior descending coronary artery, sequential reverse saphenous vein graft to the first obtuse marginal and distal circumflex with right thigh greater  saphenous endovein harvesting. SURGEON:  Lanelle Bal, MD   History of Present Illness:     Patient doing well postoperatively, returns for follow-up chest x-ray.  Earlier this week he had a cardioversion. He is to start in cardiac rehab in the near future     Past Medical History:  Diagnosis Date  . Anxiety   . Atrial flutter ([name]) 01/07/2018   Post-op after CABG  . Coronary artery disease 2003   a. nonobstructive CAD by cath in 2003 and 2005; b.  Status post three-vessel CABG on 12/12/2017 with LIMA to LAD, sequential reverse SVG to OM1 and distal LCx  . Hearing loss    bilateral  . History of benign prostatic hypertrophy   . History of radiation therapy 01/31/10-03/22/10   right tonsil/right neck node 7000 cGy 35 sessions, high risk lymph node volume 5940 cGy 35 sessions, low risk lymph node vol 5600 cGy 35 sessions  . 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 HTN (hypertension)   . Hyperlipidemia    diet controlled- taking medication d/t ensure drinking  . Hypothyroid 01/16/2013  . Ischemic cardiomyopathy    a. 10/2017: echo showing reduced EF  of 40-45%, HK of the anterior, anteroseptal and apical myocardium with Grade 1 DD.   Marland Kitchen Neck pain   . Squamous cell carcinoma    right tonsil- 2011  . Thrombocytopenia ([name]) 02/02/2012   unclear etiology  . Tinnitus      Social History   Tobacco Use  Smoking Status Never Smoker  Smokeless Tobacco Never Used    Social History   Substance and Sexual Activity  Alcohol Use Yes   Comment: 6 glasses of wine per year     Allergies  Allergen Reactions  . Ace Inhibitors Swelling and Other (See Comments)    Possible angioedema (upper lip swelling)  . Morphine And Related Swelling    SWELLING REACTION UNSPECIFIED  [severity rated per PMH, 12/11/2017]    Current Outpatient Medications  Medication Sig Dispense Refill  . apixaban (ELIQUIS) 5 MG TABS tablet Take 1 tablet (5 mg total) by mouth 2 (two) times daily. 60 tablet 6  . aspirin EC 81 MG tablet Take 1 tablet (81 mg total) by mouth daily. 90 tablet 3  . carvedilol (COREG) 6.25 MG tablet Take 1 tablet (6.25 mg total) by mouth 2 (two) times daily. (Patient taking differently: Take 6.25 mg by mouth 2 (two) times daily. ) 180 tablet 3  . Docusate Sodium (STOOL SOFTENER) 100 MG capsule Take 350 mg by mouth daily.     . furosemide (LASIX) 20 MG tablet Take 1 tablet (20 mg total) by mouth  daily. 90 tablet 3  . mirabegron ER (MYRBETRIQ) 25 MG TB24 tablet Take 25 mg by mouth daily.    . Multiple Vitamin (MULTIVITAMIN) tablet Take 1 tablet by mouth daily.    . ondansetron (ZOFRAN ODT) 4 MG disintegrating tablet Take 1 tablet (4 mg total) by mouth every 8 (eight) hours as needed for nausea or vomiting. 20 tablet 0  . POTASSIUM GLUCONATE PO Take 198 mg by mouth daily.     . rosuvastatin (CRESTOR) 20 MG tablet Take 1 tablet (20 mg total) by mouth daily. 90 tablet 3  . zaleplon (SONATA) 10 MG capsule Take 1 capsule (10 mg total) by mouth at bedtime as needed for sleep. 30 capsule 0   No current facility-administered medications for this visit.          Physical Exam: BP (!) 176/81 (BP Location: Left Arm, Patient Position: Sitting, Cuff Size: Normal)   Pulse (!) 52   Resp 20   Ht 6\' 2"  (1.88 m)   Wt 209 lb (94.8 kg)   SpO2 97% Comment: RA  BMI 26.83 kg/m   General appearance: alert and cooperative Neurologic: intact Heart: regular rate and rhythm, S1, S2 normal, no murmur, click, rub or gallop Lungs: clear to auscultation bilaterally Abdomen: soft, non-tender; bowel sounds normal; no masses,  no organomegaly Extremities: extremities normal, atraumatic, no cyanosis or edema and Homans sign is negative, no sign of DVT Wound: Sternum stable and well-healed On exam the.  Patient appears to be in sinus rhythm.  Diagnostic Studies & Laboratory data:     Recent Radiology Findings:   Dg Chest 2 View  Result Date: 02/15/2018 CLINICAL DATA:  CABG. EXAM: CHEST - 2 VIEW COMPARISON:  None. FINDINGS: Prior CABG. Cardiomegaly with normal pulmonary vascularity. Low lung volumes with mild bibasilar atelectasi, improved from prior exam. No prominent pleural effusion. No pneumothorax. No acute bony abnormality. IMPRESSION: 1.  Prior CABG.  Cardiomegaly with normal pulmonary vascularity. 2. Low lung volumes with mild bibasilar atelectasis, improved from prior exam. No prominent pleural effusion. No pneumothorax. Electronically Signed   By: Marcello Moores  Register   On: 02/15/2018 12:31    I have independently reviewed the above radiology studies  and reviewed the findings with the patient.  Recent Lab Findings: Lab Results  Component Value Date   WBC 6.8 01/03/2018   HGB 12.8 (L) 01/03/2018   HCT 39.8 01/03/2018   PLT 268 01/03/2018   GLUCOSE 108 (H) 02/06/2018   CHOL 138 11/07/2017   TRIG 144.0 11/07/2017   HDL 44.40 11/07/2017   LDLDIRECT 101.0 10/13/2014   LDLCALC 65 11/07/2017   ALT 18 12/11/2017   AST 19 12/11/2017   NA 141 02/06/2018   K 4.6 02/06/2018   CL 104 02/06/2018   CREATININE 0.76 02/06/2018   BUN 16 02/06/2018   CO2  22 02/06/2018   TSH 4.84 (H) 11/07/2017   INR 1.41 12/12/2017   HGBA1C 6.0 11/07/2017      Assessment / Plan:      Patient doing well following coronary artery bypass grafting, appears to be maintaining sinus rhythm after recent cardioversion Was given instructions about increasing his physical activity No evidence of recurrent pleural effusion Plan to see the patient back as needed    [name] MD      [address] [place] [address] Office [phone]   Beeper [phone]  02/15/2018 1:20 PM

## 2018-02-18 ENCOUNTER — Ambulatory Visit (INDEPENDENT_AMBULATORY_CARE_PROVIDER_SITE_OTHER): Payer: PPO | Admitting: *Deleted

## 2018-02-18 VITALS — BP 136/62 | HR 47 | Ht 74.0 in | Wt 206.0 lb

## 2018-02-18 DIAGNOSIS — I483 Typical atrial flutter: Secondary | ICD-10-CM | POA: Diagnosis not present

## 2018-02-18 NOTE — Patient Instructions (Signed)
Medication Instructions: - Your physician recommends that you continue on your current medications as directed. Please refer to the Current Medication list given to you today.  Labwork: - none ordered  Procedures/Testing: - none ordered  Follow-Up: - as scheduled.   Any Additional Special Instructions Will Be Listed Below (If Applicable).     If you need a refill on your cardiac medications before your next appointment, please call your pharmacy.

## 2018-02-18 NOTE — Progress Notes (Signed)
1.) Reason for visit: EKG  2.) Name of MD requesting visit:  End  3.) H&P: The patient is s/p CABG in 11/2017 and developed atrial flutter as an outpatient. He evaluated by Dr. Rockey Situ on 01/07/18 and evaluated by Dr. Saunders Revel on 01/17/18. He was still in atrial flutter at the time and was instructed to come in for a nurse visit EKG on 02/06/18- he was in atrial flutter at that time and scheduled for DCCV. He was cardioverted on 02/12/18 by Dr. Saunders Revel and presents to the office today for a follow up EKG.   4.) ROS related to problem: The patient is without report of any symptoms today. Medications reviewed with patient.   5.) Assessment and plan per MD: EKG reviewed with Dr. Rockey Situ (DOD). The patient has sinus bradycardia with a rate of 47 bpm. He is currently on coreg 6.25 mg BID. The patient states his coreg has been adjusted up and down several times. He also reports that he was previously a runner and it is not uncommon for him to have a heart rate in the 50's. His concern today is that if we decrease his beta blocker, then his BP will trend upwards. I have informed the patient that, per Dr. Rockey Situ, we will defer a decision regarding his coreg to Dr. Saunders Revel. He is also aware to check his BP readings in the AM & PM after he takes his medications and keep a track of these readings. He is aware we will call him with any further recommendations. He is scheduled to follow up on 03/20/18 with Dr. Saunders Revel.

## 2018-02-19 ENCOUNTER — Encounter: Payer: PPO | Attending: Internal Medicine | Admitting: *Deleted

## 2018-02-19 DIAGNOSIS — I2510 Atherosclerotic heart disease of native coronary artery without angina pectoris: Secondary | ICD-10-CM | POA: Diagnosis not present

## 2018-02-19 DIAGNOSIS — Z79899 Other long term (current) drug therapy: Secondary | ICD-10-CM | POA: Insufficient documentation

## 2018-02-19 DIAGNOSIS — E785 Hyperlipidemia, unspecified: Secondary | ICD-10-CM | POA: Diagnosis not present

## 2018-02-19 DIAGNOSIS — I4892 Unspecified atrial flutter: Secondary | ICD-10-CM | POA: Diagnosis not present

## 2018-02-19 DIAGNOSIS — Z7901 Long term (current) use of anticoagulants: Secondary | ICD-10-CM | POA: Diagnosis not present

## 2018-02-19 DIAGNOSIS — Z951 Presence of aortocoronary bypass graft: Secondary | ICD-10-CM | POA: Insufficient documentation

## 2018-02-19 DIAGNOSIS — I10 Essential (primary) hypertension: Secondary | ICD-10-CM | POA: Insufficient documentation

## 2018-02-19 NOTE — Progress Notes (Signed)
Daily Session Note  Patient Details  Name: Phillip [name] MRN: [id number] Date of Birth: 1941-08-08 Referring Provider:     Cardiac Rehab from 02/07/2018 in [facility] Cardiac and Pulmonary Rehab  Referring Provider  end      Encounter Date: 02/19/2018  Check In: Session Check In - 02/19/18 0920      Check-In   Location  [facility]-Cardiac & Pulmonary Rehab    Staff Present  Heath Lark, RN, BSN, CCRP;Carroll Enterkin, RN, BSN;Jessica Luan Pulling, MA, RCEP, CCRP, Exercise Physiologist;Mandi Marcello Tuzzolino, BS, [facility]    Supervising physician immediately available to respond to emergencies  See telemetry face sheet for immediately available ER MD    Medication changes reported      Yes    Comments  Increased Carvedilol     Fall or balance concerns reported     No    Warm-up and Cool-down  Performed on first and last piece of equipment    Resistance Training Performed  Yes    VAD Patient?  No      Pain Assessment   Currently in Pain?  No/denies    Pain Score  0-No pain          Social History   Tobacco Use  Smoking Status Never Smoker  Smokeless Tobacco Never Used    Goals Met:  Independence with exercise equipment Exercise tolerated well No report of cardiac concerns or symptoms Strength training completed today  Goals Unmet:  Not Applicable  Comments: First full day of exercise!  Patient was oriented to gym and equipment including functions, settings, policies, and procedures.  Patient's individual exercise prescription and treatment plan were reviewed.  All starting workloads were established based on the results of the 6 minute walk test done at initial orientation visit.  The plan for exercise progression was also introduced and progression will be customized based on patient's performance and goals.    Dr. Emily Filbert is Medical Director for [facility] and [facility].

## 2018-02-20 NOTE — Progress Notes (Signed)
Modest sinus bradycardia noted.  Given lack of symptoms, we will continue his current medications and reassess his heart rate when he follows up with me later this month.  [name], MD [facility] Pager: [phone]

## 2018-02-20 NOTE — Progress Notes (Signed)
I have called and spoken with the patient's wife, she had me on speaker phone as the patient did not have his hearing aids in at the time. They are aware of Dr. Darnelle Bos recommendations to continue his current medications and follow up as scheduled later this month.  Mrs. Stonehocker voices understanding.

## 2018-02-22 ENCOUNTER — Ambulatory Visit: Payer: Self-pay

## 2018-02-26 DIAGNOSIS — Z951 Presence of aortocoronary bypass graft: Secondary | ICD-10-CM

## 2018-02-26 NOTE — Progress Notes (Signed)
Daily Session Note  Patient Details  Name: Phillip [name] MRN: [id number] Date of Birth: 1941/01/30 Referring Provider:     Cardiac Rehab from 02/07/2018 in [facility] Cardiac and Pulmonary Rehab  Referring Provider  end      Encounter Date: 02/26/2018  Check In: Session Check In - 02/26/18 0915      Check-In   Location  [facility]-Cardiac & Pulmonary Rehab    Staff Present  Justin Mend RCP,RRT,BSRT;Heath Lark, RN, BSN, CCRP;Mandi Ballard, BS, [facility]    Supervising physician immediately available to respond to emergencies  See telemetry face sheet for immediately available ER MD    Medication changes reported      No    Fall or balance concerns reported     No    Warm-up and Cool-down  Performed on first and last piece of equipment    Resistance Training Performed  Yes    VAD Patient?  No      Pain Assessment   Currently in Pain?  No/denies          Social History   Tobacco Use  Smoking Status Never Smoker  Smokeless Tobacco Never Used    Goals Met:  Independence with exercise equipment Exercise tolerated well No report of cardiac concerns or symptoms Strength training completed today  Goals Unmet:  Not Applicable  Comments: Pt able to follow exercise prescription today without complaint.  Will continue to monitor for progression.   Dr. Emily Filbert is Medical Director for [place] and [facility].

## 2018-02-28 DIAGNOSIS — Z951 Presence of aortocoronary bypass graft: Secondary | ICD-10-CM | POA: Diagnosis not present

## 2018-02-28 NOTE — Progress Notes (Signed)
Daily Session Note  Patient Details  Name: Phillip [name] MRN: [id number] Date of Birth: 05-29-1941 Referring Provider:     Cardiac Rehab from 02/07/2018 in [facility] Cardiac and Pulmonary Rehab  Referring Provider  end      Encounter Date: 02/28/2018  Check In: Session Check In - 02/28/18 1014      Check-In   Location  [facility]-Cardiac & Pulmonary Rehab    Staff Present  Constance Goltz, RN BSN;Susanne Bice, RN, BSN, CCRP;Joseph [employer] physician immediately available to respond to emergencies  See telemetry face sheet for immediately available ER MD    Medication changes reported      No    Fall or balance concerns reported     No    Warm-up and Cool-down  Performed on first and last piece of equipment    Resistance Training Performed  Yes    VAD Patient?  No    PAD/SET Patient?  No      Pain Assessment   Currently in Pain?  No/denies    Multiple Pain Sites  No          Social History   Tobacco Use  Smoking Status Never Smoker  Smokeless Tobacco Never Used    Goals Met:  Independence with exercise equipment Exercise tolerated well No report of cardiac concerns or symptoms Strength training completed today  Goals Unmet:  Not Applicable  Comments: Pt able to follow exercise prescription today without complaint.  Will continue to monitor for progression.    Dr. Emily Filbert is Medical Director for [place] [facility] and [facility].

## 2018-03-05 ENCOUNTER — Encounter: Payer: PPO | Admitting: *Deleted

## 2018-03-05 DIAGNOSIS — Z951 Presence of aortocoronary bypass graft: Secondary | ICD-10-CM

## 2018-03-05 NOTE — Progress Notes (Signed)
Daily Session Note  Patient Details  Name: Phillip [name] MRN: [id number] Date of Birth: 09-17-1940 Referring Provider:     Cardiac Rehab from 02/07/2018 in [facility] Cardiac and Pulmonary Rehab  Referring Provider  end      Encounter Date: 03/05/2018  Check In: Session Check In - 03/05/18 0941      Check-In   Location  [facility]-Cardiac & Pulmonary Rehab    Staff Present  Heath Lark, RN, BSN, CCRP;Owain Eckerman [place], MA, RCEP, CCRP, Exercise Physiologist;Mandi Zachery Conch, BS, [facility]    Supervising physician immediately available to respond to emergencies  See telemetry face sheet for immediately available ER MD    Medication changes reported      No    Fall or balance concerns reported     No    Warm-up and Cool-down  Performed on first and last piece of equipment    Resistance Training Performed  Yes    VAD Patient?  No    PAD/SET Patient?  No      Pain Assessment   Currently in Pain?  No/denies          Social History   Tobacco Use  Smoking Status Never Smoker  Smokeless Tobacco Never Used    Goals Met:  Independence with exercise equipment Exercise tolerated well No report of cardiac concerns or symptoms Strength training completed today  Goals Unmet:  Not Applicable  Comments: Pt able to follow exercise prescription today without complaint.  Will continue to monitor for progression. Reviewed home exercise with pt today.  Pt plans to walk and possible return to [facility] for exercise.  Reviewed THR, pulse, RPE, sign and symptoms, and when to call 911 or MD.  Also discussed weather considerations and indoor options.  Pt voiced understanding.    Dr. Emily Filbert is Medical Director for [place] and [facility].

## 2018-03-06 ENCOUNTER — Telehealth: Payer: Self-pay | Admitting: *Deleted

## 2018-03-06 ENCOUNTER — Encounter: Payer: Self-pay | Admitting: *Deleted

## 2018-03-06 DIAGNOSIS — Z951 Presence of aortocoronary bypass graft: Secondary | ICD-10-CM

## 2018-03-06 NOTE — Telephone Encounter (Signed)
-----   Message from [name], MD sent at 03/05/2018  4:35 PM EDT ----- Regarding: RE: Clearance to return to hiking group Mr. Colomb is due to see me back in the office at the end of this month.  We will discuss returning to his hiking group at that time.  Thanks for your help.  Gerald Stabs  ----- Message ----- From: Clotilde Dieter Sent: 03/05/2018   4:06 PM To: [name], MD Subject: Clearance to return to hiking group            Dr. Saunders Revel,  I was reviewing home exercise with Krew today in rehab.  He mentioned that he previously belonged to the [employer] senior hiking group.  He would like to return but needs clearance from you to do so (per pt and group leader).  Please advise as to when he can return to hiking.  Thanks for your help! Alberteen Sam, MA, ACSM RCEP, [facility] 03/05/2018 4:08 PM

## 2018-03-06 NOTE — Progress Notes (Signed)
Cardiac Individual Treatment Plan  Patient Details  Name: Phillip [name] MRN: [id number] Date of Birth: 1941-03-19 Referring Provider:     Cardiac Rehab from 02/07/2018 in [facility] Cardiac and Pulmonary Rehab  Referring Provider  end      Initial Encounter Date:    Cardiac Rehab from 02/07/2018 in [facility] Cardiac and Pulmonary Rehab  Date  02/07/18      Visit Diagnosis: S/P CABG x 3  Patient's Home Medications on Admission:  Current Outpatient Medications:  .  apixaban (ELIQUIS) 5 MG TABS tablet, Take 1 tablet (5 mg total) by mouth 2 (two) times daily., Disp: 60 tablet, Rfl: 6 .  aspirin EC 81 MG tablet, Take 1 tablet (81 mg total) by mouth daily., Disp: 90 tablet, Rfl: 3 .  carvedilol (COREG) 6.25 MG tablet, Take 1 tablet (6.25 mg) by mouth twice daily, Disp: , Rfl:  .  Docusate Sodium (STOOL SOFTENER) 100 MG capsule, Take 350 mg by mouth daily. , Disp: , Rfl:  .  furosemide (LASIX) 20 MG tablet, Take 1 tablet (20 mg total) by mouth daily., Disp: 90 tablet, Rfl: 3 .  mirabegron ER (MYRBETRIQ) 25 MG TB24 tablet, Take 25 mg by mouth daily., Disp: , Rfl:  .  Multiple Vitamin (MULTIVITAMIN) tablet, Take 1 tablet by mouth daily., Disp: , Rfl:  .  ondansetron (ZOFRAN ODT) 4 MG disintegrating tablet, Take 1 tablet (4 mg total) by mouth every 8 (eight) hours as needed for nausea or vomiting., Disp: 20 tablet, Rfl: 0 .  POTASSIUM GLUCONATE PO, Take 198 mg by mouth daily. , Disp: , Rfl:  .  rosuvastatin (CRESTOR) 20 MG tablet, Take 1 tablet (20 mg total) by mouth daily., Disp: 90 tablet, Rfl: 3 .  zaleplon (SONATA) 10 MG capsule, Take 1 capsule (10 mg total) by mouth at bedtime as needed for sleep., Disp: 30 capsule, Rfl: 0  Past Medical History: Past Medical History:  Diagnosis Date  . Anxiety   . Atrial flutter ([place]) 01/07/2018   Post-op after CABG  . Coronary artery disease 2003   a. nonobstructive CAD by cath in 2003 and 2005; b.  Status post three-vessel CABG on 12/12/2017 with LIMA to LAD,  sequential reverse SVG to OM1 and distal LCx  . Hearing loss    bilateral  . History of benign prostatic hypertrophy   . History of radiation therapy 01/31/10-03/22/10   right tonsil/right neck node 7000 cGy 35 sessions, high risk lymph node volume 5940 cGy 35 sessions, low risk lymph node vol 5600 cGy 35 sessions  . 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 HTN (hypertension)   . Hyperlipidemia    diet controlled- taking medication d/t ensure drinking  . Hypothyroid 01/16/2013  . Ischemic cardiomyopathy    a. 10/2017: echo showing reduced EF of 40-45%, HK of the anterior, anteroseptal and apical myocardium with Grade 1 DD.   Marland Kitchen Neck pain   . Squamous cell carcinoma    right tonsil- 2011  . Thrombocytopenia ([place]) 02/02/2012   unclear etiology  . Tinnitus     Tobacco Use: Social History   Tobacco Use  Smoking Status Never Smoker  Smokeless Tobacco Never Used    Labs: Recent Review Flowsheet Data    Labs for ITP Cardiac and Pulmonary Rehab Latest Ref Rng & Units 12/12/2017 12/12/2017 12/13/2017 12/13/2017 12/14/2017   Cholestrol 0 - 200 mg/dL - - - - -   LDLCALC 0 - 99 mg/dL - - - - -   LDLDIRECT  mg/dL - - - - -   HDL >39.00 mg/dL - - - - -   Trlycerides 0.0 - 149.0 mg/dL - - - - -   Hemoglobin A1c 4.6 - 6.5 % - - - - -   PHART 7.350 - 7.450 - 7.365 - - -   PCO2ART 32.0 - 48.0 mmHg - 36.7 - - -   HCO3 20.0 - 28.0 mmol/L - 21.1 - - -   TCO2 22 - 32 mmol/L '24 22 24 23 ' -   ACIDBASEDEF 0.0 - 2.0 mmol/L - 4.0(H) - - -   O2SAT % - 95.0 - - 63.3       Exercise Target Goals:    Exercise Program Goal: Individual exercise prescription set using results from initial 6 min walk test and THRR while considering  patient's activity barriers and safety.   Exercise Prescription Goal: Initial exercise prescription builds to 30-45 minutes a day of aerobic activity, 2-3 days per week.  Home exercise guidelines will be given to patient during program as part of exercise prescription that the participant will  acknowledge.  Activity Barriers & Risk Stratification: Activity Barriers & Cardiac Risk Stratification - 02/07/18 1442      Activity Barriers & Cardiac Risk Stratification   Activity Barriers  Incisional Pain    Cardiac Risk Stratification  High       6 Minute Walk: 6 Minute Walk    Row Name 02/07/18 1406         6 Minute Walk   Distance  1467 feet     Walk Time  6 minutes     # of Rest Breaks  0     MPH  2.78     METS  3.12     RPE  11     Perceived Dyspnea   0     VO2 Peak  10.92     Symptoms  No     Resting HR  65 bpm     Resting BP  130/73     Resting Oxygen Saturation   95 %     Exercise Oxygen Saturation  during 6 min walk  96 %     Max Ex. HR  122 bpm     Max Ex. BP  138/74     2 Minute Post BP  118/64        Oxygen Initial Assessment:   Oxygen Re-Evaluation:   Oxygen Discharge (Final Oxygen Re-Evaluation):   Initial Exercise Prescription: Initial Exercise Prescription - 02/07/18 1400      Date of Initial Exercise RX and Referring Provider   Date  02/07/18    Referring Provider  end      Treadmill   MPH  2.5    Grade  0.5    Minutes  15    METs  3.09      Recumbant Bike   Level  3    RPM  60    Watts  33    Minutes  15    METs  3.12      REL-XR   Level  3    Speed  50    Minutes  15    METs  3      Prescription Details   Frequency (times per week)  3    Duration  Progress to 30 minutes of continuous aerobic without signs/symptoms of physical distress      Intensity   THRR 40-80% of Max Heartrate  97-127  Ratings of Perceived Exertion  11-13    Perceived Dyspnea  0-4      Resistance Training   Training Prescription  Yes    Weight  4 lb    Reps  10-15       Perform Capillary Blood Glucose checks as needed.  Exercise Prescription Changes: Exercise Prescription Changes    Row Name 02/07/18 1400 03/05/18 1000           Response to Exercise   Blood Pressure (Admit)  130/74  -      Blood Pressure (Exercise)  138/74   -      Blood Pressure (Exit)  118/64  -      Heart Rate (Admit)  65 bpm  -      Heart Rate (Exercise)  122 bpm  -      Heart Rate (Exit)  56 bpm  -      Oxygen Saturation (Admit)  95 %  -      Oxygen Saturation (Exercise)  97 %  -      Oxygen Saturation (Exit)  96 %  -      Rating of Perceived Exertion (Exercise)  11  -        Home Exercise Plan   Plans to continue exercise at  -  Home (comment) walking, [employer] (return?)      Frequency  -  Add 2 additional days to program exercise sessions.      Initial Home Exercises Provided  -  03/05/18         Exercise Comments: Exercise Comments    Row Name 02/19/18 [phone]           Exercise Comments  First full day of exercise!  Patient was oriented to gym and equipment including functions, settings, policies, and procedures.  Patient's individual exercise prescription and treatment plan were reviewed.  All starting workloads were established based on the results of the 6 minute walk test done at initial orientation visit.  The plan for exercise progression was also introduced and progression will be customized based on patient's performance and goals.          Exercise Goals and Review: Exercise Goals    Row Name 02/07/18 1405 02/19/18 0924           Exercise Goals   Increase Physical Activity  Yes  Yes      Intervention  Provide advice, education, support and counseling about physical activity/exercise needs.;Develop an individualized exercise prescription for aerobic and resistive training based on initial evaluation findings, risk stratification, comorbidities and participant's personal goals.  Provide advice, education, support and counseling about physical activity/exercise needs.;Develop an individualized exercise prescription for aerobic and resistive training based on initial evaluation findings, risk stratification, comorbidities and participant's personal goals.      Expected Outcomes  Short Term: Attend rehab on a regular basis to  increase amount of physical activity.;Long Term: Add in home exercise to make exercise part of routine and to increase amount of physical activity.;Long Term: Exercising regularly at least 3-5 days a week.  Short Term: Attend rehab on a regular basis to increase amount of physical activity.;Long Term: Add in home exercise to make exercise part of routine and to increase amount of physical activity.;Long Term: Exercising regularly at least 3-5 days a week.      Increase Strength and Stamina  Yes  Yes      Intervention  Provide advice, education, support and counseling about physical activity/exercise  needs.;Develop an individualized exercise prescription for aerobic and resistive training based on initial evaluation findings, risk stratification, comorbidities and participant's personal goals.  Provide advice, education, support and counseling about physical activity/exercise needs.;Develop an individualized exercise prescription for aerobic and resistive training based on initial evaluation findings, risk stratification, comorbidities and participant's personal goals.      Expected Outcomes  Short Term: Increase workloads from initial exercise prescription for resistance, speed, and METs.;Short Term: Perform resistance training exercises routinely during rehab and add in resistance training at home;Long Term: Improve cardiorespiratory fitness, muscular endurance and strength as measured by increased METs and functional capacity (6MWT)  Short Term: Increase workloads from initial exercise prescription for resistance, speed, and METs.;Short Term: Perform resistance training exercises routinely during rehab and add in resistance training at home;Long Term: Improve cardiorespiratory fitness, muscular endurance and strength as measured by increased METs and functional capacity (6MWT)      Able to understand and use rate of perceived exertion (RPE) scale  Yes  Yes      Intervention  Provide education and explanation  on how to use RPE scale  Provide education and explanation on how to use RPE scale      Expected Outcomes  Short Term: Able to use RPE daily in rehab to express subjective intensity level;Long Term:  Able to use RPE to guide intensity level when exercising independently  Short Term: Able to use RPE daily in rehab to express subjective intensity level      Able to understand and use Dyspnea scale  Yes  Yes      Intervention  Provide education and explanation on how to use Dyspnea scale  Provide education and explanation on how to use Dyspnea scale      Expected Outcomes  Short Term: Able to use Dyspnea scale daily in rehab to express subjective sense of shortness of breath during exertion;Long Term: Able to use Dyspnea scale to guide intensity level when exercising independently  Short Term: Able to use Dyspnea scale daily in rehab to express subjective sense of shortness of breath during exertion;Long Term: Able to use Dyspnea scale to guide intensity level when exercising independently      Knowledge and understanding of Target Heart Rate Range (THRR)  Yes  Yes      Intervention  Provide education and explanation of THRR including how the numbers were predicted and where they are located for reference  Provide education and explanation of THRR including how the numbers were predicted and where they are located for reference      Expected Outcomes  Short Term: Able to state/look up THRR;Short Term: Able to use daily as guideline for intensity in rehab;Long Term: Able to use THRR to govern intensity when exercising independently  Short Term: Able to state/look up THRR;Long Term: Able to use THRR to govern intensity when exercising independently;Short Term: Able to use daily as guideline for intensity in rehab      Able to check pulse independently  Yes  Yes      Intervention  Provide education and demonstration on how to check pulse in carotid and radial arteries.;Review the importance of being able to check  your own pulse for safety during independent exercise  Provide education and demonstration on how to check pulse in carotid and radial arteries.;Review the importance of being able to check your own pulse for safety during independent exercise      Expected Outcomes  Short Term: Able to explain why pulse checking is  important during independent exercise;Long Term: Able to check pulse independently and accurately  Short Term: Able to explain why pulse checking is important during independent exercise;Long Term: Able to check pulse independently and accurately      Understanding of Exercise Prescription  Yes  Yes      Intervention  Provide education, explanation, and written materials on patient's individual exercise prescription  Provide education, explanation, and written materials on patient's individual exercise prescription      Expected Outcomes  Short Term: Able to explain program exercise prescription;Long Term: Able to explain home exercise prescription to exercise independently  Short Term: Able to explain program exercise prescription;Long Term: Able to explain home exercise prescription to exercise independently         Exercise Goals Re-Evaluation : Exercise Goals Re-Evaluation    [place] Name 03/05/18 1044             Exercise Goal Re-Evaluation   Exercise Goals Review  Increase Physical Activity;Able to understand and use rate of perceived exertion (RPE) scale;Understanding of Exercise Prescription;Increase Strength and Stamina       Comments  Reviewed home exercise with pt today.  Pt plans to walk and possible return to [facility] for exercise.  Reviewed THR, pulse, RPE, sign and symptoms, and when to call 911 or MD.  Also discussed weather considerations and indoor options.  Pt voiced understanding.       Expected Outcomes  Short: Add in at least two extra days a week of exercise walking.  Long: Continue to build strength and stamina.           Discharge Exercise Prescription  (Final Exercise Prescription Changes): Exercise Prescription Changes - 03/05/18 1000      Home Exercise Plan   Plans to continue exercise at  Home (comment) walking, [employer] (return?)    Frequency  Add 2 additional days to program exercise sessions.    Initial Home Exercises Provided  03/05/18       Nutrition:  Target Goals: Understanding of nutrition guidelines, daily intake of sodium <1518m, cholesterol <2082m calories 30% from fat and 7% or less from saturated fats, daily to have 5 or more servings of fruits and vegetables.  Biometrics: Pre Biometrics - 02/07/18 1405      Pre Biometrics   Height  '6\' 1"'  (1.854 m)    Weight  203 lb 3.2 oz (92.2 kg)    Waist Circumference  39 inches    Hip Circumference  43 inches    Waist to Hip Ratio  0.91 %    BMI (Calculated)  26.81    Single Leg Stand  2.78 seconds        Nutrition Therapy Plan and Nutrition Goals: Nutrition Therapy & Goals - 02/07/18 1436      Intervention Plan   Intervention  Prescribe, educate and counsel regarding individualized specific dietary modifications aiming towards targeted core components such as weight, hypertension, lipid management, diabetes, heart failure and other comorbidities.;Nutrition handout(s) given to patient.    Expected Outcomes  Short Term Goal: Understand basic principles of dietary content, such as calories, fat, sodium, cholesterol and nutrients.;Short Term Goal: A plan has been developed with personal nutrition goals set during dietitian appointment.;Long Term Goal: Adherence to prescribed nutrition plan.       Nutrition Assessments: Nutrition Assessments - 02/07/18 1437      MEDFICTS Scores   Pre Score  27       Nutrition Goals Re-Evaluation:   Nutrition Goals Discharge (  Final Nutrition Goals Re-Evaluation):   Psychosocial: Target Goals: Acknowledge presence or absence of significant depression and/or stress, maximize coping skills, provide positive support system. Participant  is able to verbalize types and ability to use techniques and skills needed for reducing stress and depression.   Initial Review & Psychosocial Screening: Initial Psych Review & Screening - 02/07/18 1439      Initial Review   Current issues with  Current Stress Concerns;Current Sleep Concerns wakes up every 1.5 hrs to use bathroom, but is able to fall back to sleep easily    Source of Stress Concerns  Unable to perform yard/household activities;Unable to participate in former interests or hobbies      [place]?  Yes Wife, family, friends, church, [employer]      Barriers   Psychosocial barriers to participate in program  There are no identifiable barriers or psychosocial needs.;The patient should benefit from training in stress management and relaxation.      Screening Interventions   Interventions  Encouraged to exercise;Provide feedback about the scores to participant;Program counselor consult;To provide support and resources with identified psychosocial needs    Expected Outcomes  Short Term goal: Utilizing psychosocial counselor, staff and physician to assist with identification of specific Stressors or current issues interfering with healing process. Setting desired goal for each stressor or current issue identified.;Long Term Goal: Stressors or current issues are controlled or eliminated.;Short Term goal: Identification and review with participant of any Quality of Life or Depression concerns found by scoring the questionnaire.;Long Term goal: The participant improves quality of Life and PHQ9 Scores as seen by post scores and/or verbalization of changes       Quality of Life Scores:  Quality of Life - 02/07/18 1440      Quality of Life Scores   Health/Function Pre  27.3 %    Socioeconomic Pre  27 %    Psych/Spiritual Pre  30 %    Family Pre  27.6 %    GLOBAL Pre  27.86 %      Scores of 19 and below usually indicate a poorer quality of life in  these areas.  A difference of  2-3 points is a clinically meaningful difference.  A difference of 2-3 points in the total score of the Quality of Life Index has been associated with significant improvement in overall quality of life, self-image, physical symptoms, and general health in studies assessing change in quality of life.  PHQ-9: Recent Review Flowsheet Data    Depression screen [facility] 2/9 02/07/2018 11/07/2017 12/18/2016 05/29/2016 10/21/2015   Decreased Interest 0 0 - 0 0   Down, Depressed, Hopeless 0 0 0 0 0   PHQ - 2 Score 0 0 0 0 0   Altered sleeping 0 0 - - -   Tired, decreased energy 1 0 - - -   Change in appetite 3 0 - - -   Feeling bad or failure about yourself  0 0 - - -   Trouble concentrating 0 0 - - -   Moving slowly or fidgety/restless 0 0 - - -   Suicidal thoughts 0 0 - - -   PHQ-9 Score 4 0 - - -   Difficult doing work/chores Not difficult at all Not difficult at all - - -     Interpretation of Total Score  Total Score Depression Severity:  1-4 = Minimal depression, 5-9 = Mild depression, 10-14 = Moderate depression, 15-19 = Moderately  severe depression, 20-27 = Severe depression   Psychosocial Evaluation and Intervention: Psychosocial Evaluation - 02/26/18 1035      Psychosocial Evaluation & Interventions   Interventions  Encouraged to exercise with the program and follow exercise prescription;Relaxation education;Stress management education    Comments  Counselor met with Octavion today for initial psychosocial evaluation.  He is a well-adjusted 78 year old who had a CABGx3 procedure in late April of this year.  He has a strong support system with a spouse of 25 years and several adult step-children who live locally.  Christoher is actively involved in his local church and in [employer].  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  As a result, he has minimal taste in foods and minimal appetite.  He reports "living off glucerna)  daily for his nutritional needs.  He sleeps well and denies a history of depression or anxiety.  Dyon states he is typically in a positive mood - although he is very passionate about many things and is intense at times in his approach, which he would benefit from stress management techniques for relaxation and health benefits.  Otherwise he has minimal stress in his life.  Hartman has goals to increase his stamina and strength and to be able to get back on the golf course and to his normal activities.  He will be followed by staff while in this program.      Expected Outcomes  Short:  Thaddius will meet with the dietician to discuss his nutritional intake, as he is "living off glucerna."  Long:  Demonte will exercise consistently to increase his stamina and strength and to manage stress.    Continue Psychosocial Services   Follow up required by staff       Psychosocial Re-Evaluation:   Psychosocial Discharge (Final Psychosocial Re-Evaluation):   Vocational Rehabilitation: Provide vocational rehab assistance to qualifying candidates.   Vocational Rehab Evaluation & Intervention: Vocational Rehab - 02/07/18 1441      Initial Vocational Rehab Evaluation & Intervention   Assessment shows need for Vocational Rehabilitation  No       Education: Education Goals: Education classes will be provided on a variety of topics geared toward better understanding of heart health and risk factor modification. Participant will state understanding/return demonstration of topics presented as noted by education test scores.  Learning Barriers/Preferences: Learning Barriers/Preferences - 02/07/18 1441      Learning Barriers/Preferences   Learning Barriers  Exercise Concerns    Learning Preferences  None       Education Topics:  AED/CPR: - Group verbal and written instruction with the use of models to demonstrate the basic use of the AED with the basic ABC's of resuscitation.   General Nutrition  Guidelines/Fats and Fiber: -Group instruction provided by verbal, written material, models and posters to present the general guidelines for heart healthy nutrition. Gives an explanation and review of dietary fats and fiber.   Cardiac Rehab from 03/05/2018 in [facility] Cardiac and Pulmonary Rehab  Date  03/05/18  Educator  CR  Instruction Review Code  1- Verbalizes Understanding      Controlling Sodium/Reading Food Labels: -Group verbal and written material supporting the discussion of sodium use in heart healthy nutrition. Review and explanation with models, verbal and written materials for utilization of the food label.   Exercise Physiology & General Exercise Guidelines: - Group verbal and written instruction with models to review the exercise physiology of the  cardiovascular system and associated critical values. Provides general exercise guidelines with specific guidelines to those with heart or lung disease.    Aerobic Exercise & Resistance Training: - Gives group verbal and written instruction on the various components of exercise. Focuses on aerobic and resistive training programs and the benefits of this training and how to safely progress through these programs..   Flexibility, Balance, Mind/Body Relaxation: Provides group verbal/written instruction on the benefits of flexibility and balance training, including mind/body exercise modes such as yoga, pilates and tai chi.  Demonstration and skill practice provided.   Stress and Anxiety: - Provides group verbal and written instruction about the health risks of elevated stress and causes of high stress.  Discuss the correlation between heart/lung disease and anxiety and treatment options. Review healthy ways to manage with stress and anxiety.   Cardiac Rehab from 03/05/2018 in [facility] Cardiac and Pulmonary Rehab  Date  02/26/18  Educator  [facility]  Instruction Review Code  1- Verbalizes Understanding      Depression: - Provides group verbal  and written instruction on the correlation between heart/lung disease and depressed mood, treatment options, and the stigmas associated with seeking treatment.   Anatomy & Physiology of the Heart: - Group verbal and written instruction and models provide basic cardiac anatomy and physiology, with the coronary electrical and arterial systems. Review of Valvular disease and Heart Failure   Cardiac Rehab from 03/05/2018 in [facility] Cardiac and Pulmonary Rehab  Date  02/28/18  Educator  SB  Instruction Review Code  1- Verbalizes Understanding      Cardiac Procedures: - Group verbal and written instruction to review commonly prescribed medications for heart disease. Reviews the medication, class of the drug, and side effects. Includes the steps to properly store meds and maintain the prescription regimen. (beta blockers and nitrates)   Cardiac Rehab from 03/05/2018 in [facility] Cardiac and Pulmonary Rehab  Date  02/19/18  Educator  CE  Instruction Review Code  1- Verbalizes Understanding      Cardiac Medications I: - Group verbal and written instruction to review commonly prescribed medications for heart disease. Reviews the medication, class of the drug, and side effects. Includes the steps to properly store meds and maintain the prescription regimen.   Cardiac Medications II: -Group verbal and written instruction to review commonly prescribed medications for heart disease. Reviews the medication, class of the drug, and side effects. (all other drug classes)    Go Sex-Intimacy & Heart Disease, Get SMART - Goal Setting: - Group verbal and written instruction through game format to discuss heart disease and the return to sexual intimacy. Provides group verbal and written material to discuss and apply goal setting through the application of the S.M.A.R.T. Method.   Cardiac Rehab from 03/05/2018 in [facility] Cardiac and Pulmonary Rehab  Date  02/19/18  Educator  CE  Instruction Review Code  1- Verbalizes  Understanding      Other Matters of the Heart: - Provides group verbal, written materials and models to describe Stable Angina and Peripheral Artery. Includes description of the disease process and treatment options available to the cardiac patient.   Cardiac Rehab from 03/05/2018 in [facility] Cardiac and Pulmonary Rehab  Date  02/28/18  Educator  [place]  Instruction Review Code  1- Verbalizes Understanding      Exercise & Equipment Safety: - Individual verbal instruction and demonstration of equipment use and safety with use of the equipment.   Cardiac Rehab from 03/05/2018 in [facility] Cardiac and Pulmonary  Rehab  Date  02/07/18  Educator  [place]  Instruction Review Code  1- Verbalizes Understanding      Infection Prevention: - Provides verbal and written material to individual with discussion of infection control including proper hand washing and proper equipment cleaning during exercise session.   Cardiac Rehab from 03/05/2018 in [facility] Cardiac and Pulmonary Rehab  Date  02/07/18  Educator  [facility]  Instruction Review Code  1- Verbalizes Understanding      Falls Prevention: - Provides verbal and written material to individual with discussion of falls prevention and safety.   Cardiac Rehab from 03/05/2018 in [facility] Cardiac and Pulmonary Rehab  Date  02/07/18  Educator  [facility]  Instruction Review Code  1- Verbalizes Understanding      Diabetes: - Individual verbal and written instruction to review signs/symptoms of diabetes, desired ranges of glucose level fasting, after meals and with exercise. Acknowledge that pre and post exercise glucose checks will be done for 3 sessions at entry of program.   Know Your Numbers and Risk Factors: -Group verbal and written instruction about important numbers in your health.  Discussion of what are risk factors and how they play a role in the disease process.  Review of Cholesterol, Blood Pressure, Diabetes, and BMI and the role they play in your overall  health.   Sleep Hygiene: -Provides group verbal and written instruction about how sleep can affect your health.  Define sleep hygiene, discuss sleep cycles and impact of sleep habits. Review good sleep hygiene tips.    Other: -Provides group and verbal instruction on various topics (see comments)   Knowledge Questionnaire Score: Knowledge Questionnaire Score - 02/07/18 1441      Knowledge Questionnaire Score   Pre Score  24/26 correct answers reviewed with Fara Olden       Core Components/Risk Factors/Patient Goals at Admission: Personal Goals and Risk Factors at Admission - 02/07/18 1435      Core Components/Risk Factors/Patient Goals on Admission   Hypertension  Yes    Intervention  Provide education on lifestyle modifcations including regular physical activity/exercise, weight management, moderate sodium restriction and increased consumption of fresh fruit, vegetables, and low fat dairy, alcohol moderation, and smoking cessation.;Monitor prescription use compliance.    Expected Outcomes  Short Term: Continued assessment and intervention until BP is < 140/63m HG in hypertensive participants. < 130/817mHG in hypertensive participants with diabetes, heart failure or chronic kidney disease.;Long Term: Maintenance of blood pressure at goal levels.    Lipids  Yes    Intervention  Provide education and support for participant on nutrition & aerobic/resistive exercise along with prescribed medications to achieve LDL <7065mHDL >68m76m  Expected Outcomes  Short Term: Participant states understanding of desired cholesterol values and is compliant with medications prescribed. Participant is following exercise prescription and nutrition guidelines.;Long Term: Cholesterol controlled with medications as prescribed, with individualized exercise RX and with personalized nutrition plan. Value goals: LDL < 70mg28mL > 40 mg.       Core Components/Risk Factors/Patient Goals Review:    Core  Components/Risk Factors/Patient Goals at Discharge (Final Review):    ITP Comments: ITP Comments    Row Name 02/07/18 1430 03/06/18 0558         ITP Comments  Med review completed. Initial ITP created. Diagnosis can be found in [facility]/19  30 day review. Continue with ITP unless directed changes per Medical Director review.  New to program         Comments:  30 day review. Continue with ITP unless directed changes per Medical Director review.

## 2018-03-07 ENCOUNTER — Encounter: Payer: PPO | Admitting: *Deleted

## 2018-03-07 DIAGNOSIS — Z951 Presence of aortocoronary bypass graft: Secondary | ICD-10-CM | POA: Diagnosis not present

## 2018-03-07 NOTE — Progress Notes (Signed)
Daily Session Note  Patient Details  Name: Torrell Krutz MRN: [id number] Date of Birth: Jul 03, 1941 Referring Provider:     Cardiac Rehab from 02/07/2018 in [facility] Cardiac and Pulmonary Rehab  Referring Provider  end      Encounter Date: 03/07/2018  Check In: Session Check In - 03/07/18 8719      Check-In   Location  [facility]-Cardiac & Pulmonary Rehab    Staff Present  Justin Mend RCP,RRT,BSRT;Heath Lark, RN, BSN, CCRP;Mandi Khalila Buechner, BS, [facility]    Supervising physician immediately available to respond to emergencies  See telemetry face sheet for immediately available ER MD    Medication changes reported      No    Fall or balance concerns reported     No    Warm-up and Cool-down  Performed on first and last piece of equipment    Resistance Training Performed  Yes    VAD Patient?  No    PAD/SET Patient?  No      Pain Assessment   Currently in Pain?  No/denies    Pain Score  0-No pain    Multiple Pain Sites  No          Social History   Tobacco Use  Smoking Status Never Smoker  Smokeless Tobacco Never Used    Goals Met:  Independence with exercise equipment Exercise tolerated well No report of cardiac concerns or symptoms Strength training completed today  Goals Unmet:  Not Applicable  Comments: Pt able to follow exercise prescription today without complaint.  Will continue to monitor for progression.    Dr. Emily Filbert is Medical Director for [place] and [facility].

## 2018-03-12 DIAGNOSIS — Z951 Presence of aortocoronary bypass graft: Secondary | ICD-10-CM | POA: Diagnosis not present

## 2018-03-12 NOTE — Progress Notes (Signed)
Daily Session Note  Patient Details  Name: Phillip [name] MRN: [id number] Date of Birth: Nov 27, 1940 Referring Provider:     Cardiac Rehab from 02/07/2018 in [facility] Cardiac and Pulmonary Rehab  Referring Provider  end      Encounter Date: 03/12/2018  Check In: Session Check In - 03/12/18 1059      Check-In   Location  [facility]-Cardiac & Pulmonary Rehab    Staff Present  Heath Lark, RN, BSN, CCRP;Mandi Haasini Patnaude, BS, [place];Nada Maclachlan, BA, ACSM CEP, Exercise Physiologist    Supervising physician immediately available to respond to emergencies  See telemetry face sheet for immediately available ER MD    Medication changes reported      No    Fall or balance concerns reported     No    Warm-up and Cool-down  Performed on first and last piece of equipment    Resistance Training Performed  Yes    VAD Patient?  No    PAD/SET Patient?  No      Pain Assessment   Currently in Pain?  No/denies    Pain Score  0-No pain    Multiple Pain Sites  No          Social History   Tobacco Use  Smoking Status Never Smoker  Smokeless Tobacco Never Used    Goals Met:  Independence with exercise equipment Exercise tolerated well No report of cardiac concerns or symptoms Strength training completed today  Goals Unmet:  Not Applicable  Comments: Pt able to follow exercise prescription today without complaint.  Will continue to monitor for progression.    Dr. Emily Filbert is Medical Director for [place] and [facility].

## 2018-03-14 DIAGNOSIS — Z951 Presence of aortocoronary bypass graft: Secondary | ICD-10-CM | POA: Diagnosis not present

## 2018-03-14 NOTE — Progress Notes (Signed)
Daily Session Note  Patient Details  Name: Phillip [name] MRN: [id number] Date of Birth: 1940/10/14 Referring Provider:     Cardiac Rehab from 02/07/2018 in [facility] Cardiac and Pulmonary Rehab  Referring Provider  end      Encounter Date: 03/14/2018  Check In: Session Check In - 03/14/18 0931      Check-In   Location  [facility]-Cardiac & Pulmonary Rehab    Staff Present  Justin Mend RCP,RRT,BSRT;Carroll Enterkin, RN, BSN;Mandi Ballard, BS, [facility]    Supervising physician immediately available to respond to emergencies  See telemetry face sheet for immediately available ER MD    Medication changes reported      No    Fall or balance concerns reported     No    Warm-up and Cool-down  Performed on first and last piece of equipment    Resistance Training Performed  Yes    VAD Patient?  No      Pain Assessment   Currently in Pain?  No/denies          Social History   Tobacco Use  Smoking Status Never Smoker  Smokeless Tobacco Never Used    Goals Met:  Independence with exercise equipment Exercise tolerated well Personal goals reviewed No report of cardiac concerns or symptoms Strength training completed today\  Goals Unmet:  Not Applicable  Comments: Pt able to follow exercise prescription today without complaint.  Will continue to monitor for progression. See ITP for goal review  Dr. Emily Filbert is Medical Director for [place] and [facility].

## 2018-03-19 DIAGNOSIS — Z951 Presence of aortocoronary bypass graft: Secondary | ICD-10-CM

## 2018-03-19 NOTE — Progress Notes (Signed)
Follow-up Outpatient Visit Date: 03/20/2018  Primary Care Provider: [name], MD [address] [place] [address]  Chief Complaint: Gum bleeding and dizziness  HPI:  Phillip [name] is a 77 y.o. year-old male with history of coronary artery disease status post CABG in 04/3234, chronic systolic heart failure secondary to ischemic cardiomyopathy, hypertension, hyperlipidemia, who presents for follow-up of coronary artery disease, cardiomyopathy, and atrial fibrillation.  I last saw him in late May, at which time he remained in atrial flutter with adequate rate control.  After completing 1 month of anticoagulation, Phillip [name] underwent successful cardioversion in mid June with restoration of sinus rhythm.  Follow-up EKG earlier this month showed sinus bradycardia with heart rate of 47 despite discontinuation of digoxin and de-escalation of beta-blocker.  He has been participating in cardiac rehab since that time.  Today, Phillip [name] reports feeling well.  His only complaints are of gum bleeding after brushing his teeth, which has been present for many years but seems to have been more difficult to stop since being started on apixaban and aspirin.  He also reports several episodes of dizziness/lightheadedness lasting up to 30 seconds over the last month.  Interestingly, he has not had any for about 2 weeks.  They can occur both when seated or when active.  He has not passed out.  There are no associated symptoms such as palpitations, chest pain, or shortness of breath.  Phillip [name] reports being compliant with his medications.  He also denies orthopnea, PND, and edema.  --------------------------------------------------------------------------------------------------  Past Medical History:  Diagnosis Date  . Anxiety   . Atrial flutter ([place]) 01/07/2018   Post-op after CABG  . Coronary artery disease 2003   a. nonobstructive CAD by cath in 2003 and 2005; b.  Status post three-vessel  CABG on 12/12/2017 with LIMA to LAD, sequential reverse SVG to OM1 and distal LCx  . Hearing loss    bilateral  . History of benign prostatic hypertrophy   . History of radiation therapy 01/31/10-03/22/10   right tonsil/right neck node 7000 cGy 35 sessions, high risk lymph node volume 5940 cGy 35 sessions, low risk lymph node vol 5600 cGy 35 sessions  . 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 HTN (hypertension)   . Hyperlipidemia    diet controlled- taking medication d/t ensure drinking  . Hypothyroid 01/16/2013  . Ischemic cardiomyopathy    a. 10/2017: echo showing reduced EF of 40-45%, HK of the anterior, anteroseptal and apical myocardium with Grade 1 DD.   Marland Kitchen Neck pain   . Squamous cell carcinoma    right tonsil- 2011  . Thrombocytopenia ([place]) 02/02/2012   unclear etiology  . Tinnitus    Past Surgical History:  Procedure Laterality Date  . CARDIAC CATHETERIZATION  2003, 2005   40% blockage, two 30% blockages treated medically Ron Parker)  . CARDIOVERSION N/A 02/12/2018   Procedure: CARDIOVERSION;  Surgeon: [name], MD;  Location: [facility] ORS;  Service: Cardiovascular;  Laterality: N/A;  . COLONOSCOPY  03/2015   TA x3, HP, melanosis coli, severe diverticulosis, rpt 3 yrs ([place])  . CORONARY ARTERY BYPASS GRAFT N/A 12/12/2017   Procedure: CORONARY ARTERY BYPASS GRAFTING (CABG) x3 using the right greater saphenous vein harvested endoscopically and the left internal mammary artery. LIMA to LAD, SEQ SVG to OM1 & OM2;  Surgeon: [name], MD;  Location: [place];  Service: Open Heart Surgery;  Laterality: N/A;  . DENTAL SURGERY     extractions  .  IR THORACENTESIS ASP PLEURAL SPACE W/IMG GUIDE  01/17/2018  . KNEE ARTHROSCOPY Left   . LEFT HEART CATH AND CORONARY ANGIOGRAPHY N/A 12/10/2017   Procedure: LEFT HEART CATH AND CORONARY ANGIOGRAPHY;  Surgeon: [name], MD;  Location: [place] CV LAB;  Service: Cardiovascular;  Laterality: N/A;  . PEG PLACEMENT  02/2010  . TEE WITHOUT  CARDIOVERSION N/A 12/12/2017   Procedure: TRANSESOPHAGEAL ECHOCARDIOGRAM (TEE);  Surgeon: [name], MD;  Location: [place];  Service: Open Heart Surgery;  Laterality: N/A;  . TONSILLECTOMY    . VASECTOMY      Current Meds  Medication Sig  . apixaban (ELIQUIS) 5 MG TABS tablet Take 1 tablet (5 mg total) by mouth 2 (two) times daily.  Marland Kitchen aspirin EC 81 MG tablet Take 1 tablet (81 mg total) by mouth daily.  . carvedilol (COREG) 6.25 MG tablet Take 1 tablet (6.25 mg) by mouth twice daily  . Docusate Sodium (STOOL SOFTENER) 100 MG capsule Take 350 mg by mouth daily.   . furosemide (LASIX) 20 MG tablet Take 1 tablet (20 mg total) by mouth daily.  . mirabegron ER (MYRBETRIQ) 25 MG TB24 tablet Take 25 mg by mouth daily.  . Multiple Vitamin (MULTIVITAMIN) tablet Take 1 tablet by mouth daily.  . ondansetron (ZOFRAN ODT) 4 MG disintegrating tablet Take 1 tablet (4 mg total) by mouth every 8 (eight) hours as needed for nausea or vomiting.  Marland Kitchen POTASSIUM GLUCONATE PO Take 198 mg by mouth daily.   . rosuvastatin (CRESTOR) 20 MG tablet Take 1 tablet (20 mg total) by mouth daily.  . zaleplon (SONATA) 10 MG capsule Take 1 capsule (10 mg total) by mouth at bedtime as needed for sleep.    Allergies: Ace inhibitors and Morphine and related  Social History   Tobacco Use  . Smoking status: Never Smoker  . Smokeless tobacco: Never Used  Substance Use Topics  . Alcohol use: Yes    Comment: 6 glasses of wine per year  . Drug use: No    Family History  Problem Relation Age of Onset  . Coronary artery disease Father   . Heart attack Father 66  . Heart disease Father   . Stroke Mother   . Colon cancer Neg Hx   . Esophageal cancer Neg Hx   . Rectal cancer Neg Hx   . Stomach cancer Neg Hx     Review of Systems: A 12-system review of systems was performed and was negative except as noted in the  HPI.  --------------------------------------------------------------------------------------------------  Physical Exam: BP 130/68 (BP Location: Left Arm, Patient Position: Sitting, Cuff Size: Normal)   Pulse 62   Ht 6\' 2"  (1.88 m)   Wt 202 lb (91.6 kg)   BMI 25.94 kg/m   General: NAD. HEENT: No conjunctival pallor or scleral icterus. Moist mucous membranes.  OP clear. Neck: Supple without lymphadenopathy, thyromegaly, JVD, or HJR. Lungs: Normal work of breathing. Clear to auscultation bilaterally without wheezes or crackles. Heart: Regular rate and rhythm with occasional extrasystoles.  No murmurs, rubs, or gallops.  Nondisplaced PMI.  Healed median sternotomy incision. Abd: Bowel sounds present. Soft, NT/ND without hepatosplenomegaly Ext: No lower extremity edema. Radial, PT, and DP pulses are 2+ bilaterally. Skin: Warm and dry without rash.  EKG: Normal sinus rhythm with isolated PVCs, left axis deviation, incomplete left bundle branch block, nonspecific T wave changes.  Heart rate has increased since 02/18/2018.  PVCs are also new.  Otherwise, there has been no significant interval  change.  Lab Results  Component Value Date   WBC 6.8 01/03/2018   HGB 12.8 (L) 01/03/2018   HCT 39.8 01/03/2018   MCV 94 01/03/2018   PLT 268 01/03/2018    Lab Results  Component Value Date   NA 141 02/06/2018   K 4.6 02/06/2018   CL 104 02/06/2018   CO2 22 02/06/2018   BUN 16 02/06/2018   CREATININE 0.76 02/06/2018   GLUCOSE 108 (H) 02/06/2018   ALT 18 12/11/2017    Lab Results  Component Value Date   CHOL 138 11/07/2017   HDL 44.40 11/07/2017   LDLCALC 65 11/07/2017   LDLDIRECT 101.0 10/13/2014   TRIG 144.0 11/07/2017   CHOLHDL 3 11/07/2017    --------------------------------------------------------------------------------------------------  ASSESSMENT AND PLAN: Coronary artery disease without angina Mr. Rogus continues to do well without symptoms to suggest worsening coronary  insufficiency.  We will continue his current medications for secondary prevention, including aspirin and apixaban in the setting of recent atrial flutter status post cardioversion.  I encouraged Mr. Drone to continue with cardiac rehab.  We will defer having him restart his hiking club until after a limited echo has been performed to reassess his LVEF.  I have provided him with a prescription for sublingual nitroglycerin to be used as needed for chest pain.  Chronic systolic heart failure secondary to ischemic cardiomyopathy Mr. Degollado appears euvolemic and well compensated on exam with NYHA class I-II symptoms.  Echo prior to CABG showed an EF of 40 to 45%.  We have agreed to obtain a limited echocardiogram to reassess his LV function.  Atrial flutter EKG today demonstrates sinus rhythm following cardioversion.  We will plan to continue apixaban for now but obtain a 14-day event monitor to evaluate for intermittent bouts of atrial fibrillation/flutter as well as bradycardia that may be be leading to intermittent lightheadedness.  If no atrial fibrillation/flutter is identified, I would favor discontinuation of apixaban given gum bleeding and low risk of recurrence in the setting of postoperative atrial flutter.  Given recent lightheadedness as well as PVCs on EKG today, I will check a complete metabolic panel and magnesium level.  Additionally, we will check a CBC given gum bleeding.  Hypertension Blood pressure reasonably well controlled today.  I will not make any medication changes today.  Hyperlipidemia Goal LDL less than 70.  We will check a lipid panel today and LFTs today with plans to continue rosuvastatin.  Follow-up: Return to clinic in 3 months.  [name], MD 03/20/2018 9:19 AM

## 2018-03-19 NOTE — Progress Notes (Signed)
Daily Session Note  Patient Details  Name: Eryn Krejci MRN: [id number] Date of Birth: May 17, 1941 Referring Provider:     Cardiac Rehab from 02/07/2018 in [facility] Cardiac and Pulmonary Rehab  Referring Provider  end      Encounter Date: 03/19/2018  Check In: Session Check In - 03/19/18 8242      Check-In   Supervising physician immediately available to respond to emergencies  See telemetry face sheet for immediately available ER MD    Location  [facility]-Cardiac & Pulmonary Rehab    Staff Present  Heath Lark, RN, BSN, CCRP;Joseph Darrin Nipper, [place], [place], [place], Exercise Physiologist    Medication changes reported      No    Fall or balance concerns reported     No    Warm-up and Cool-down  Performed on first and last piece of equipment    Resistance Training Performed  Yes    VAD Patient?  No      Pain Assessment   Currently in Pain?  No/denies          Social History   Tobacco Use  Smoking Status Never Smoker  Smokeless Tobacco Never Used    Goals Met:  Independence with exercise equipment Exercise tolerated well No report of cardiac concerns or symptoms Strength training completed today  Goals Unmet:  Not Applicable  Comments: Pt able to follow exercise prescription today without complaint.  Will continue to monitor for progression.   Dr. Emily Filbert is Medical Director for [place] and [facility].

## 2018-03-20 ENCOUNTER — Encounter: Payer: Self-pay | Admitting: Internal Medicine

## 2018-03-20 ENCOUNTER — Ambulatory Visit: Payer: PPO | Admitting: Internal Medicine

## 2018-03-20 ENCOUNTER — Encounter (INDEPENDENT_AMBULATORY_CARE_PROVIDER_SITE_OTHER): Payer: Self-pay

## 2018-03-20 VITALS — BP 130/68 | HR 62 | Ht 74.0 in | Wt 202.0 lb

## 2018-03-20 DIAGNOSIS — Z13220 Encounter for screening for lipoid disorders: Secondary | ICD-10-CM | POA: Diagnosis not present

## 2018-03-20 DIAGNOSIS — I2510 Atherosclerotic heart disease of native coronary artery without angina pectoris: Secondary | ICD-10-CM

## 2018-03-20 DIAGNOSIS — E785 Hyperlipidemia, unspecified: Secondary | ICD-10-CM

## 2018-03-20 DIAGNOSIS — Z79899 Other long term (current) drug therapy: Secondary | ICD-10-CM

## 2018-03-20 DIAGNOSIS — I5032 Chronic diastolic (congestive) heart failure: Secondary | ICD-10-CM | POA: Insufficient documentation

## 2018-03-20 DIAGNOSIS — I4892 Unspecified atrial flutter: Secondary | ICD-10-CM | POA: Diagnosis not present

## 2018-03-20 DIAGNOSIS — I5022 Chronic systolic (congestive) heart failure: Secondary | ICD-10-CM

## 2018-03-20 DIAGNOSIS — I10 Essential (primary) hypertension: Secondary | ICD-10-CM

## 2018-03-20 DIAGNOSIS — I255 Ischemic cardiomyopathy: Secondary | ICD-10-CM

## 2018-03-20 DIAGNOSIS — I483 Typical atrial flutter: Secondary | ICD-10-CM | POA: Diagnosis not present

## 2018-03-20 MED ORDER — NITROGLYCERIN 0.4 MG SL SUBL: 0.4000 mg | SUBLINGUAL_TABLET | SUBLINGUAL | 99 refills | Status: DC | PRN

## 2018-03-20 NOTE — Patient Instructions (Signed)
Medication Instructions:  Your physician recommends that you continue on your current medications as directed. Please refer to the Current Medication list given to you today.   Labwork: Your physician recommends that you return for lab work in: TODAY (LIPID, CMET, Mg, CBC).   Testing/Procedures: Your physician has requested that you have an LIMITED echocardiogram. Echocardiography is a painless test that uses sound waves to create images of your heart. It provides your doctor with information about the size and shape of your heart and how well your heart's chambers and valves are working. This procedure takes approximately one hour. There are no restrictions for this procedure. You may get an IV, if needed, to receive an ultrasound enhancing agent through to better visualize your heart.    Your physician has recommended that you wear an 14 DAY ZIO event monitor. Event monitors are medical devices that record the heart's electrical activity. Doctors most often [place] these monitors to diagnose arrhythmias. Arrhythmias are problems with the speed or rhythm of the heartbeat. The monitor is a small, portable device. You can wear one while you do your normal daily activities. This is usually used to diagnose what is causing palpitations/syncope (passing out). A Zio Patch Event Heart monitor will be applied to your chest today.  You will wear the patch for 14 days. After 24 hours, you may shower with the heart monitor on. If you feel any SYMPTOM, you may press and release the button in the middle of the monitor.     Follow-Up: Your physician recommends that you schedule a follow-up appointment in: 3 MONTHS WITH DR END.  If you need a refill on your cardiac medications before your next appointment, please call your pharmacy.    Echocardiogram An echocardiogram, or echocardiography, uses sound waves (ultrasound) to produce an image of your heart. The echocardiogram is simple, painless, obtained  within a short period of time, and offers valuable information to your health care provider. The images from an echocardiogram can provide information such as:  Evidence of coronary artery disease (CAD).  Heart size.  Heart muscle function.  Heart valve function.  Aneurysm detection.  Evidence of a past heart attack.  Fluid buildup around the heart.  Heart muscle thickening.  Assess heart valve function.  Tell a health care provider about:  Any allergies you have.  All medicines you are taking, including vitamins, herbs, eye drops, creams, and over-the-counter medicines.  Any problems you or family members have had with anesthetic medicines.  Any blood disorders you have.  Any surgeries you have had.  Any medical conditions you have.  Whether you are pregnant or may be pregnant. What happens before the procedure? No special preparation is needed. Eat and drink normally. What happens during the procedure?  In order to produce an image of your heart, gel will be applied to your chest and a wand-like tool (transducer) will be moved over your chest. The gel will help transmit the sound waves from the transducer. The sound waves will harmlessly bounce off your heart to allow the heart images to be captured in real-time motion. These images will then be recorded.  You may need an IV to receive a medicine that improves the quality of the pictures. What happens after the procedure? You may return to your normal schedule including diet, activities, and medicines, unless your health care provider tells you otherwise. This information is not intended to replace advice given to you by your health care provider. Make sure you discuss  any questions you have with your health care provider. Document Released: 08/04/2000 Document Revised: 03/25/2016 Document Reviewed: 04/14/2013 Elsevier Interactive Patient Education  2017 [employer].

## 2018-03-21 ENCOUNTER — Encounter: Payer: PPO | Attending: Internal Medicine

## 2018-03-21 DIAGNOSIS — Z79899 Other long term (current) drug therapy: Secondary | ICD-10-CM | POA: Diagnosis not present

## 2018-03-21 DIAGNOSIS — Z951 Presence of aortocoronary bypass graft: Secondary | ICD-10-CM | POA: Insufficient documentation

## 2018-03-21 DIAGNOSIS — I4892 Unspecified atrial flutter: Secondary | ICD-10-CM | POA: Diagnosis not present

## 2018-03-21 DIAGNOSIS — I10 Essential (primary) hypertension: Secondary | ICD-10-CM | POA: Diagnosis not present

## 2018-03-21 DIAGNOSIS — E785 Hyperlipidemia, unspecified: Secondary | ICD-10-CM | POA: Diagnosis not present

## 2018-03-21 DIAGNOSIS — Z7901 Long term (current) use of anticoagulants: Secondary | ICD-10-CM | POA: Diagnosis not present

## 2018-03-21 DIAGNOSIS — I2510 Atherosclerotic heart disease of native coronary artery without angina pectoris: Secondary | ICD-10-CM | POA: Insufficient documentation

## 2018-03-21 LAB — COMPREHENSIVE METABOLIC PANEL
A/G RATIO: 1.9 (ref 1.2–2.2)
ALBUMIN: 4.4 g/dL (ref 3.5–4.8)
ALT: 16 IU/L (ref 0–44)
AST: 25 IU/L (ref 0–40)
Alkaline Phosphatase: 72 IU/L (ref 39–117)
BUN / CREAT RATIO: 22 (ref 10–24)
BUN: 17 mg/dL (ref 8–27)
Bilirubin Total: 0.5 mg/dL (ref 0.0–1.2)
CO2: 22 mmol/L (ref 20–29)
Calcium: 9.6 mg/dL (ref 8.6–10.2)
Chloride: 103 mmol/L (ref 96–106)
Creatinine, Ser: 0.77 mg/dL (ref 0.76–1.27)
GFR calc Af Amer: 100 mL/min/{1.73_m2} (ref >59)
GFR calc non Af Amer: 87 mL/min/{1.73_m2} (ref >59)
GLOBULIN, TOTAL: 2.3 g/dL (ref 1.5–4.5)
Glucose: 113 mg/dL — ABNORMAL HIGH (ref 65–99)
Potassium: 4.4 mmol/L (ref 3.5–5.2)
SODIUM: 142 mmol/L (ref 134–144)
Total Protein: 6.7 g/dL (ref 6.0–8.5)

## 2018-03-21 LAB — LIPID PANEL
CHOLESTEROL TOTAL: 119 mg/dL (ref 100–199)
Chol/HDL Ratio: 3.2 ratio (ref 0.0–5.0)
HDL: 37 mg/dL — ABNORMAL LOW (ref >39)
LDL CALC: 49 mg/dL (ref 0–99)
TRIGLYCERIDES: 163 mg/dL — AB (ref 0–149)
VLDL Cholesterol Cal: 33 mg/dL (ref 5–40)

## 2018-03-21 LAB — CBC WITH DIFFERENTIAL/PLATELET
BASOS: 0 %
Basophils Absolute: 0 10*3/uL (ref 0.0–0.2)
EOS (ABSOLUTE): 0.1 10*3/uL (ref 0.0–0.4)
EOS: 1 %
HEMOGLOBIN: 16 g/dL (ref 13.0–17.7)
Hematocrit: 49.3 % (ref 37.5–51.0)
Immature Grans (Abs): 0 10*3/uL (ref 0.0–0.1)
Immature Granulocytes: 0 %
LYMPHS: 11 %
Lymphocytes Absolute: 0.5 10*3/uL — ABNORMAL LOW (ref 0.7–3.1)
MCH: 29.4 pg (ref 26.6–33.0)
MCHC: 32.5 g/dL (ref 31.5–35.7)
MCV: 91 fL (ref 79–97)
MONOS ABS: 0.5 10*3/uL (ref 0.1–0.9)
Monocytes: 11 %
NEUTROS PCT: 77 %
Neutrophils Absolute: 3.7 10*3/uL (ref 1.4–7.0)
Platelets: 144 10*3/uL — ABNORMAL LOW (ref 150–450)
RBC: 5.45 x10E6/uL (ref 4.14–5.80)
RDW: 15.8 % — AB (ref 12.3–15.4)
WBC: 4.8 10*3/uL (ref 3.4–10.8)

## 2018-03-21 LAB — MAGNESIUM: Magnesium: 2.3 mg/dL (ref 1.6–2.3)

## 2018-03-21 NOTE — Progress Notes (Signed)
Daily Session Note  Patient Details  Name: Phillip [name] MRN: [id number] Date of Birth: [birth year] Referring Provider:     Cardiac Rehab from 02/07/2018 in [facility] Cardiac and Pulmonary Rehab  Referring Provider  end      Encounter Date: 03/21/2018  Check In: Session Check In - 03/21/18 0918      Check-In   Supervising physician immediately available to respond to emergencies  See telemetry face sheet for immediately available ER MD    Location  [facility]-Cardiac & Pulmonary Rehab    Staff Present  Justin Mend RCP,RRT,BSRT;Carroll Enterkin, RN, Levie Heritage, MA, RCEP, CCRP, Exercise Physiologist    Medication changes reported      No    Fall or balance concerns reported     No    Warm-up and Cool-down  Performed on first and last piece of equipment    Resistance Training Performed  Yes    VAD Patient?  No      Pain Assessment   Currently in Pain?  No/denies          Social History   Tobacco Use  Smoking Status Never Smoker  Smokeless Tobacco Never Used    Goals Met:  Independence with exercise equipment Exercise tolerated well No report of cardiac concerns or symptoms Strength training completed today  Goals Unmet:  Not Applicable  Comments: Pt able to follow exercise prescription today without complaint.  Will continue to monitor for progression.   Dr. Emily Filbert is Medical Director for [place] and [facility].

## 2018-03-26 ENCOUNTER — Encounter: Payer: PPO | Admitting: *Deleted

## 2018-03-26 DIAGNOSIS — Z951 Presence of aortocoronary bypass graft: Secondary | ICD-10-CM | POA: Diagnosis not present

## 2018-03-26 NOTE — Progress Notes (Signed)
Daily Session Note  Patient Details  Name: Phillip [name] MRN: [id number] Date of Birth: 02/09/1941 Referring Provider:     Cardiac Rehab from 02/07/2018 in [facility] Cardiac and Pulmonary Rehab  Referring Provider  end      Encounter Date: 03/26/2018  Check In: Session Check In - 03/26/18 0936      Check-In   Supervising physician immediately available to respond to emergencies  See telemetry face sheet for immediately available ER MD    Location  [facility]-Cardiac & Pulmonary Rehab    Staff Present  Joellyn Rued, BS, PEC;Susanne Bice, RN, BSN, CCRP;Rodel Glaspy [place], [place], RCEP, CCRP, Exercise Physiologist    Medication changes reported      No    Fall or balance concerns reported     No    Warm-up and Cool-down  Performed on first and last piece of equipment    Resistance Training Performed  Yes    VAD Patient?  No    PAD/SET Patient?  No      Pain Assessment   Currently in Pain?  No/denies          Social History   Tobacco Use  Smoking Status Never Smoker  Smokeless Tobacco Never Used    Goals Met:  Independence with exercise equipment Exercise tolerated well No report of cardiac concerns or symptoms Strength training completed today  Goals Unmet:  Not Applicable  Comments: Pt able to follow exercise prescription today without complaint.  Will continue to monitor for progression.    Dr. Emily Filbert is Medical Director for [place] and [facility].

## 2018-03-28 ENCOUNTER — Encounter: Payer: PPO | Admitting: *Deleted

## 2018-03-28 DIAGNOSIS — Z951 Presence of aortocoronary bypass graft: Secondary | ICD-10-CM

## 2018-03-28 NOTE — Progress Notes (Signed)
Daily Session Note  Patient Details  Name: Phillip [name] MRN: [id number] Date of Birth: [birth year] Referring Provider:     Cardiac Rehab from 02/07/2018 in [facility] Cardiac and Pulmonary Rehab  Referring Provider  end      Encounter Date: 03/28/2018  Check In: Session Check In - 03/28/18 0931      Check-In   Supervising physician immediately available to respond to emergencies  See telemetry face sheet for immediately available ER MD    Location  [facility]-Cardiac & Pulmonary Rehab    Staff Present  Joellyn Rued, BS, PEC;Meredith Sherryll Burger, RN BSN;Joseph Hood RCP,RRT,BSRT    Medication changes reported      No    Fall or balance concerns reported     No    Warm-up and Cool-down  Performed on first and last piece of equipment    Resistance Training Performed  Yes    VAD Patient?  No    PAD/SET Patient?  No      Pain Assessment   Currently in Pain?  No/denies    Pain Score  0-No pain    Multiple Pain Sites  No          Social History   Tobacco Use  Smoking Status Never Smoker  Smokeless Tobacco Never Used    Goals Met:  Independence with exercise equipment Exercise tolerated well No report of cardiac concerns or symptoms Strength training completed today  Goals Unmet:  Not Applicable  Comments: Pt able to follow exercise prescription today without complaint.  Will continue to monitor for progression.    Dr. Emily Filbert is Medical Director for [place] and [facility].

## 2018-04-02 ENCOUNTER — Encounter: Payer: PPO | Admitting: *Deleted

## 2018-04-02 DIAGNOSIS — Z951 Presence of aortocoronary bypass graft: Secondary | ICD-10-CM

## 2018-04-02 NOTE — Progress Notes (Signed)
Daily Session Note  Patient Details  Name: Phillip [name] MRN: [id number] Date of Birth: May 01, 1941 Referring Provider:     Cardiac Rehab from 02/07/2018 in [facility] Cardiac and Pulmonary Rehab  Referring Provider  end      Encounter Date: 04/02/2018  Check In: Session Check In - 04/02/18 0920      Check-In   Supervising physician immediately available to respond to emergencies  See telemetry face sheet for immediately available ER MD    Location  [facility]-Cardiac & Pulmonary Rehab    Staff Present  Joellyn Rued, BS, PEC;Susanne Bice, RN, BSN, CCRP;Jessica Wann, [place], RCEP, CCRP, Exercise Physiologist    Medication changes reported      No    Fall or balance concerns reported     No    Warm-up and Cool-down  Performed on first and last piece of equipment    Resistance Training Performed  Yes    VAD Patient?  No    PAD/SET Patient?  No      Pain Assessment   Currently in Pain?  No/denies          Social History   Tobacco Use  Smoking Status Never Smoker  Smokeless Tobacco Never Used    Goals Met:  Independence with exercise equipment Exercise tolerated well No report of cardiac concerns or symptoms Strength training completed today  Goals Unmet:  Not Applicable  Comments: Pt able to follow exercise prescription today without complaint.  Will continue to monitor for progression.    Dr. Emily Filbert is Medical Director for [place] and [facility].

## 2018-04-03 ENCOUNTER — Ambulatory Visit: Payer: PPO

## 2018-04-03 ENCOUNTER — Other Ambulatory Visit: Payer: Self-pay

## 2018-04-03 ENCOUNTER — Ambulatory Visit (INDEPENDENT_AMBULATORY_CARE_PROVIDER_SITE_OTHER): Payer: PPO | Admitting: *Deleted

## 2018-04-03 ENCOUNTER — Ambulatory Visit (INDEPENDENT_AMBULATORY_CARE_PROVIDER_SITE_OTHER): Payer: PPO

## 2018-04-03 ENCOUNTER — Encounter: Payer: Self-pay | Admitting: *Deleted

## 2018-04-03 DIAGNOSIS — I483 Typical atrial flutter: Secondary | ICD-10-CM

## 2018-04-03 DIAGNOSIS — Z951 Presence of aortocoronary bypass graft: Secondary | ICD-10-CM

## 2018-04-03 DIAGNOSIS — I5022 Chronic systolic (congestive) heart failure: Secondary | ICD-10-CM

## 2018-04-03 NOTE — Progress Notes (Signed)
Patient came in as a nurse visit to have a Zio 14 day monitor placed. Serial number is [id number]. Patient has verbalized his understanding and will call if anything is needed. End date will be 04/17/18.  1.) Reason for visit: Zio 14 day Monitor  2.) Name of MD requesting visit: Dr. Saunders Revel  3.) ROS related to problem: Chronic Systolic Heart Failure and Atrial Flutter

## 2018-04-03 NOTE — Progress Notes (Signed)
Cardiac Individual Treatment Plan  Patient Details  Name: Phillip [name] MRN: [id number] Date of Birth: [birth year] Referring Provider:     Cardiac Rehab from 02/07/2018 in [facility] Cardiac and Pulmonary Rehab  Referring Provider  end      Initial Encounter Date:    Cardiac Rehab from 02/07/2018 in [facility] Cardiac and Pulmonary Rehab  Date  02/07/18      Visit Diagnosis: S/P CABG x 3  Patient's Home Medications on Admission:  Current Outpatient Medications:  .  apixaban (ELIQUIS) 5 MG TABS tablet, Take 1 tablet (5 mg total) by mouth 2 (two) times daily., Disp: 60 tablet, Rfl: 6 .  aspirin EC 81 MG tablet, Take 1 tablet (81 mg total) by mouth daily., Disp: 90 tablet, Rfl: 3 .  carvedilol (COREG) 6.25 MG tablet, Take 1 tablet (6.25 mg) by mouth twice daily, Disp: , Rfl:  .  Docusate Sodium (STOOL SOFTENER) 100 MG capsule, Take 350 mg by mouth daily. , Disp: , Rfl:  .  furosemide (LASIX) 20 MG tablet, Take 1 tablet (20 mg total) by mouth daily., Disp: 90 tablet, Rfl: 3 .  mirabegron ER (MYRBETRIQ) 25 MG TB24 tablet, Take 25 mg by mouth daily., Disp: , Rfl:  .  Multiple Vitamin (MULTIVITAMIN) tablet, Take 1 tablet by mouth daily., Disp: , Rfl:  .  nitroGLYCERIN (NITROSTAT) 0.4 MG SL tablet, Place 1 tablet (0.4 mg total) under the tongue every 5 (five) minutes as needed for chest pain., Disp: 30 tablet, Rfl: prn .  ondansetron (ZOFRAN ODT) 4 MG disintegrating tablet, Take 1 tablet (4 mg total) by mouth every 8 (eight) hours as needed for nausea or vomiting., Disp: 20 tablet, Rfl: 0 .  POTASSIUM GLUCONATE PO, Take 198 mg by mouth daily. , Disp: , Rfl:  .  rosuvastatin (CRESTOR) 20 MG tablet, Take 1 tablet (20 mg total) by mouth daily., Disp: 90 tablet, Rfl: 3 .  zaleplon (SONATA) 10 MG capsule, Take 1 capsule (10 mg total) by mouth at bedtime as needed for sleep., Disp: 30 capsule, Rfl: 0  Past Medical History: Past Medical History:  Diagnosis Date  . Anxiety   . Atrial flutter ([place]) 01/07/2018   Post-op after CABG  . Coronary artery disease 2003   a. nonobstructive CAD by cath in 2003 and 2005; b.  Status post three-vessel CABG on 12/12/2017 with LIMA to LAD, sequential reverse SVG to OM1 and distal LCx  . Hearing loss    bilateral  . History of benign prostatic hypertrophy   . History of radiation therapy 01/31/10-03/22/10   right tonsil/right neck node 7000 cGy 35 sessions, high risk lymph node volume 5940 cGy 35 sessions, low risk lymph node vol 5600 cGy 35 sessions  . 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 HTN (hypertension)   . Hyperlipidemia    diet controlled- taking medication d/t ensure drinking  . Hypothyroid 01/16/2013  . Ischemic cardiomyopathy    a. 10/2017: echo showing reduced EF of 40-45%, HK of the anterior, anteroseptal and apical myocardium with Grade 1 DD.   Marland Kitchen Neck pain   . Squamous cell carcinoma    right tonsil- 2011  . Thrombocytopenia ([place]) 02/02/2012   unclear etiology  . Tinnitus     Tobacco Use: Social History   Tobacco Use  Smoking Status Never Smoker  Smokeless Tobacco Never Used    Labs: Recent Review Scientist, physiological    Labs for ITP Cardiac and Pulmonary Rehab Latest Ref Rng & Units 12/12/2017 12/13/2017  12/13/2017 12/14/2017 03/20/2018   Cholestrol 100 - 199 mg/dL - - - - 119   LDLCALC 0 - 99 mg/dL - - - - 49   LDLDIRECT mg/dL - - - - -   HDL >39 mg/dL - - - - 37(L)   Trlycerides 0 - 149 mg/dL - - - - 163(H)   Hemoglobin A1c 4.6 - 6.5 % - - - - -   PHART 7.350 - 7.450 7.365 - - - -   PCO2ART 32.0 - 48.0 mmHg 36.7 - - - -   HCO3 20.0 - 28.0 mmol/L 21.1 - - - -   TCO2 22 - 32 mmol/L _0 - -   ACIDBASEDEF 0.0 - 2.0 mmol/L 4.0(H) - - - -   O2SAT % 95.0 - - 63.3 -       Exercise Target Goals: Exercise Program Goal: Individual exercise prescription set using results from initial 6 min walk test and THRR while considering  patient's activity barriers and safety.   Exercise Prescription Goal: Initial exercise prescription builds to 30-45 minutes a  day of aerobic activity, 2-3 days per week.  Home exercise guidelines will be given to patient during program as part of exercise prescription that the participant will acknowledge.  Activity Barriers & Risk Stratification: Activity Barriers & Cardiac Risk Stratification - 02/07/18 1442      Activity Barriers & Cardiac Risk Stratification   Activity Barriers  Incisional Pain    Cardiac Risk Stratification  High       6 Minute Walk: 6 Minute Walk    Row Name 02/07/18 1406         6 Minute Walk   Distance  1467 feet     Walk Time  6 minutes     # of Rest Breaks  0     MPH  2.78     METS  3.12     RPE  11     Perceived Dyspnea   0     VO2 Peak  10.92     Symptoms  No     Resting HR  65 bpm     Resting BP  130/73     Resting Oxygen Saturation   95 %     Exercise Oxygen Saturation  during 6 min walk  96 %     Max Ex. HR  122 bpm     Max Ex. BP  138/74     2 Minute Post BP  118/64        Oxygen Initial Assessment:   Oxygen Re-Evaluation:   Oxygen Discharge (Final Oxygen Re-Evaluation):   Initial Exercise Prescription: Initial Exercise Prescription - 02/07/18 1400      Date of Initial Exercise RX and Referring Provider   Date  02/07/18    Referring Provider  end      Treadmill   MPH  2.5    Grade  0.5    Minutes  15    METs  3.09      Recumbant Bike   Level  3    RPM  60    Watts  33    Minutes  15    METs  3.12      REL-XR   Level  3    Speed  50    Minutes  15    METs  3      Prescription Details   Frequency (times per week)  3    Duration  Progress to 30 minutes of continuous aerobic without signs/symptoms of physical distress      Intensity   THRR 40-80% of Max Heartrate  97-127    Ratings of Perceived Exertion  11-13    Perceived Dyspnea  0-4      Resistance Training   Training Prescription  Yes    Weight  4 lb    Reps  10-15       Perform Capillary Blood Glucose checks as needed.  Exercise Prescription Changes: Exercise  Prescription Changes    Row Name 02/07/18 1400 03/05/18 1000 03/14/18 1100 03/28/18 1100       Response to Exercise   Blood Pressure (Admit)  130/74  -  126/72  118/66    Blood Pressure (Exercise)  138/74  -  170/84  154/68    Blood Pressure (Exit)  118/64  -  122/78  126/74    Heart Rate (Admit)  65 bpm  -  82 bpm  89 bpm    Heart Rate (Exercise)  122 bpm  -  107 bpm  119 bpm    Heart Rate (Exit)  56 bpm  -  68 bpm  77 bpm    Oxygen Saturation (Admit)  95 %  -  -  -    Oxygen Saturation (Exercise)  97 %  -  -  -    Oxygen Saturation (Exit)  96 %  -  -  -    Rating of Perceived Exertion (Exercise)  11  -  -  -    Symptoms  -  -  -  none    Comments  -  -  -  Increased workload on the TM     Duration  -  -  Continue with 30 min of aerobic exercise without signs/symptoms of physical distress.  Continue with 30 min of aerobic exercise without signs/symptoms of physical distress.    Intensity  -  -  THRR unchanged  THRR unchanged      Progression   Progression  -  -  Continue to progress workloads to maintain intensity without signs/symptoms of physical distress.  Continue to progress workloads to maintain intensity without signs/symptoms of physical distress.    Average METs  -  -  3.74  3.28      Resistance Training   Training Prescription  -  -  Yes  Yes    Weight  -  -  4 lb  4 lb    Reps  -  -  10-15  10-15      Interval Training   Interval Training  -  -  No  No      Treadmill   MPH  -  -  2.7  3.2    Grade  -  -  0.5  0    Minutes  -  -  15  15    METs  -  -  3.07  3.45      Recumbant Bike   Level  -  -  -  7    Watts  -  -  -  33    Minutes  -  -  -  15    METs  -  -  -  3.12      REL-XR   Level  -  -  3  -    Minutes  -  -  15  -    METs  -  -  4.4  -      Home Exercise Plan   Plans to continue exercise at  -  Home (comment) walking, [employer] (return?)  Home (comment)  Home (comment)    Frequency  -  Add 2 additional days to program exercise sessions.  Add 2  additional days to program exercise sessions.  Add 2 additional days to program exercise sessions.    Initial Home Exercises Provided  -  03/05/18  03/05/18  03/05/18       Exercise Comments: Exercise Comments    Row Name 02/19/18 [phone]           Exercise Comments  First full day of exercise!  Patient was oriented to gym and equipment including functions, settings, policies, and procedures.  Patient's individual exercise prescription and treatment plan were reviewed.  All starting workloads were established based on the results of the 6 minute walk test done at initial orientation visit.  The plan for exercise progression was also introduced and progression will be customized based on patient's performance and goals.          Exercise Goals and Review: Exercise Goals    Row Name 02/07/18 1405 02/19/18 0924           Exercise Goals   Increase Physical Activity  Yes  Yes      Intervention  Provide advice, education, support and counseling about physical activity/exercise needs.;Develop an individualized exercise prescription for aerobic and resistive training based on initial evaluation findings, risk stratification, comorbidities and participant's personal goals.  Provide advice, education, support and counseling about physical activity/exercise needs.;Develop an individualized exercise prescription for aerobic and resistive training based on initial evaluation findings, risk stratification, comorbidities and participant's personal goals.      Expected Outcomes  Short Term: Attend rehab on a regular basis to increase amount of physical activity.;Long Term: Add in home exercise to make exercise part of routine and to increase amount of physical activity.;Long Term: Exercising regularly at least 3-5 days a week.  Short Term: Attend rehab on a regular basis to increase amount of physical activity.;Long Term: Add in home exercise to make exercise part of routine and to increase amount of physical  activity.;Long Term: Exercising regularly at least 3-5 days a week.      Increase Strength and Stamina  Yes  Yes      Intervention  Provide advice, education, support and counseling about physical activity/exercise needs.;Develop an individualized exercise prescription for aerobic and resistive training based on initial evaluation findings, risk stratification, comorbidities and participant's personal goals.  Provide advice, education, support and counseling about physical activity/exercise needs.;Develop an individualized exercise prescription for aerobic and resistive training based on initial evaluation findings, risk stratification, comorbidities and participant's personal goals.      Expected Outcomes  Short Term: Increase workloads from initial exercise prescription for resistance, speed, and METs.;Short Term: Perform resistance training exercises routinely during rehab and add in resistance training at home;Long Term: Improve cardiorespiratory fitness, muscular endurance and strength as measured by increased METs and functional capacity (6MWT)  Short Term: Increase workloads from initial exercise prescription for resistance, speed, and METs.;Short Term: Perform resistance training exercises routinely during rehab and add in resistance training at home;Long Term: Improve cardiorespiratory fitness, muscular endurance and strength as measured by increased METs and functional capacity (6MWT)      Able to understand and use rate of perceived exertion (RPE) scale  Yes  Yes      Intervention  Provide education and  explanation on how to use RPE scale  Provide education and explanation on how to use RPE scale      Expected Outcomes  Short Term: Able to use RPE daily in rehab to express subjective intensity level;Long Term:  Able to use RPE to guide intensity level when exercising independently  Short Term: Able to use RPE daily in rehab to express subjective intensity level      Able to understand and use  Dyspnea scale  Yes  Yes      Intervention  Provide education and explanation on how to use Dyspnea scale  Provide education and explanation on how to use Dyspnea scale      Expected Outcomes  Short Term: Able to use Dyspnea scale daily in rehab to express subjective sense of shortness of breath during exertion;Long Term: Able to use Dyspnea scale to guide intensity level when exercising independently  Short Term: Able to use Dyspnea scale daily in rehab to express subjective sense of shortness of breath during exertion;Long Term: Able to use Dyspnea scale to guide intensity level when exercising independently      Knowledge and understanding of Target Heart Rate Range (THRR)  Yes  Yes      Intervention  Provide education and explanation of THRR including how the numbers were predicted and where they are located for reference  Provide education and explanation of THRR including how the numbers were predicted and where they are located for reference      Expected Outcomes  Short Term: Able to state/look up THRR;Short Term: Able to use daily as guideline for intensity in rehab;Long Term: Able to use THRR to govern intensity when exercising independently  Short Term: Able to state/look up THRR;Long Term: Able to use THRR to govern intensity when exercising independently;Short Term: Able to use daily as guideline for intensity in rehab      Able to check pulse independently  Yes  Yes      Intervention  Provide education and demonstration on how to check pulse in carotid and radial arteries.;Review the importance of being able to check your own pulse for safety during independent exercise  Provide education and demonstration on how to check pulse in carotid and radial arteries.;Review the importance of being able to check your own pulse for safety during independent exercise      Expected Outcomes  Short Term: Able to explain why pulse checking is important during independent exercise;Long Term: Able to check  pulse independently and accurately  Short Term: Able to explain why pulse checking is important during independent exercise;Long Term: Able to check pulse independently and accurately      Understanding of Exercise Prescription  Yes  Yes      Intervention  Provide education, explanation, and written materials on patient's individual exercise prescription  Provide education, explanation, and written materials on patient's individual exercise prescription      Expected Outcomes  Short Term: Able to explain program exercise prescription;Long Term: Able to explain home exercise prescription to exercise independently  Short Term: Able to explain program exercise prescription;Long Term: Able to explain home exercise prescription to exercise independently         Exercise Goals Re-Evaluation : Exercise Goals Re-Evaluation    Row Name 03/05/18 1044 03/14/18 1027 03/28/18 1140         Exercise Goal Re-Evaluation   Exercise Goals Review  Increase Physical Activity;Able to understand and use rate of perceived exertion (RPE) scale;Understanding of Exercise Prescription;Increase Strength and  Stamina  Increase Physical Activity;Understanding of Exercise Prescription;Increase Strength and Stamina  Increase Physical Activity;Understanding of Exercise Prescription;Increase Strength and Stamina     Comments  Reviewed home exercise with pt today.  Pt plans to walk and possible return to [facility] for exercise.  Reviewed THR, pulse, RPE, sign and symptoms, and when to call 911 or MD.  Also discussed weather considerations and indoor options.  Pt voiced understanding.  Callie has been doing well in rehab.  He is doing well in rehab and has been a running most of his life.  He has been walking at the mall and using his handweights at home.  He will talk to his doctor at the end of next month about returning to hiking.   Foxx continues to progress in rehab. He has inceased his speed on the treadmill to 3.2 mph. His  attitude towards getting better is great and he states he refuses to not get better each and every day.      Expected Outcomes  Short: Add in at least two extra days a week of exercise walking.  Long: Continue to build strength and stamina.   Short; Continue to get in his two extra days at mall walking.  Long; Continue to increase activity back up to be able to go to [facility].   Short: increase activity levels at home Long: increase overall fitness and MET levels         Discharge Exercise Prescription (Final Exercise Prescription Changes): Exercise Prescription Changes - 03/28/18 1100      Response to Exercise   Blood Pressure (Admit)  118/66    Blood Pressure (Exercise)  154/68    Blood Pressure (Exit)  126/74    Heart Rate (Admit)  89 bpm    Heart Rate (Exercise)  119 bpm    Heart Rate (Exit)  77 bpm    Symptoms  none    Comments  Increased workload on the TM     Duration  Continue with 30 min of aerobic exercise without signs/symptoms of physical distress.    Intensity  THRR unchanged      Progression   Progression  Continue to progress workloads to maintain intensity without signs/symptoms of physical distress.    Average METs  3.28      Resistance Training   Training Prescription  Yes    Weight  4 lb    Reps  10-15      Interval Training   Interval Training  No      Treadmill   MPH  3.2    Grade  0    Minutes  15    METs  3.45      Recumbant Bike   Level  7    Watts  33    Minutes  15    METs  3.12      Home Exercise Plan   Plans to continue exercise at  Home (comment)    Frequency  Add 2 additional days to program exercise sessions.    Initial Home Exercises Provided  03/05/18       Nutrition:  Target Goals: Understanding of nutrition guidelines, daily intake of sodium <1546m, cholesterol <2087m calories 30% from fat and 7% or less from saturated fats, daily to have 5 or more servings of fruits and vegetables.  Biometrics: Pre Biometrics - 02/07/18 1405       Pre Biometrics   Height  _0  (1.854 m)    Weight  203 lb 3.2 oz (92.2 kg)    Waist Circumference  39 inches    Hip Circumference  43 inches    Waist to Hip Ratio  0.91 %    BMI (Calculated)  26.81    Single Leg Stand  2.78 seconds        Nutrition Therapy Plan and Nutrition Goals: Nutrition Therapy & Goals - 03/07/18 1052      Nutrition Therapy   Diet  TLC    Protein (specify units)  12-13oz    Fiber  35 grams   per pt, typically consumes at least 40g/ day   Saturated Fats  16 max. grams    Fruits and Vegetables  5 servings/day   8 ideal, cosumes mostly nutritional supplements   Sodium  2000 grams      Personal Nutrition Goals   Nutrition Goal  Aim to maintain CBW +/- 5#. If you notice your weight decreasing, add 1-2 more Glucerna nutritional supplements per day, and/or consume an extra small meal daily    Personal Goal #2  Look for lower sodium versions of items like soup, canned beans, and hot dogs when possible    Comments  D/t h/o tonsil cancer resulting in alterations to taste and saliva production, he lives off of Glucerna supplements mainly, consuming at least 12 cans/day with small amounts of soft and savory foods. To a certain extent he monitors fiber, sodium and cholesterol.      Intervention Plan   Intervention  Prescribe, educate and counsel regarding individualized specific dietary modifications aiming towards targeted core components such as weight, hypertension, lipid management, diabetes, heart failure and other comorbidities.    Expected Outcomes  Short Term Goal: A plan has been developed with personal nutrition goals set during dietitian appointment.;Long Term Goal: Adherence to prescribed nutrition plan.;Short Term Goal: Understand basic principles of dietary content, such as calories, fat, sodium, cholesterol and nutrients.       Nutrition Assessments: Nutrition Assessments - 02/07/18 1437      MEDFICTS Scores   Pre Score  27       Nutrition Goals  Re-Evaluation: Nutrition Goals Re-Evaluation    [place] Name 03/07/18 1100 03/14/18 1032           Goals   Nutrition Goal  Aim to maintain CBW +/- 5#. If you notice your weight decreasing, add 1-2 more Glucerna nutritional supplements per day, and/or consume an extra small meal daily  Aim to maintain CBW +/- 5#. If you notice your weight decreasing, add 1-2 more Glucerna nutritional supplements per day, and/or consume an extra small meal daily      Comment  He reports to have lost 10# since his cardiac event 3 months ago and feels that his CBW is healthy for his age. Would like to maintain CBW  Avrohom has been doing most of the cooking at home.  He is working on weight maintenance.  He has been using glucerna daily.  He has tried to cut back on his sodium intake and reading food labels.        Expected Outcome  He will maintain his CBW +/- 5#, balancing his physical activity with his nutritional intake  Short: Continue to work on improving diet.  Long: Continue to eat healthy.         Personal Goal #2 Re-Evaluation   Personal Goal #2  Look for lower sodium versions of items like soup, canned beans, and hot dogs when possible  -  Nutrition Goals Discharge (Final Nutrition Goals Re-Evaluation): Nutrition Goals Re-Evaluation - 03/14/18 1032      Goals   Nutrition Goal  Aim to maintain CBW +/- 5#. If you notice your weight decreasing, add 1-2 more Glucerna nutritional supplements per day, and/or consume an extra small meal daily    Comment  Taj has been doing most of the cooking at home.  He is working on weight maintenance.  He has been using glucerna daily.  He has tried to cut back on his sodium intake and reading food labels.      Expected Outcome  Short: Continue to work on improving diet.  Long: Continue to eat healthy.        Psychosocial: Target Goals: Acknowledge presence or absence of significant depression and/or stress, maximize coping skills, provide positive support system.  Participant is able to verbalize types and ability to use techniques and skills needed for reducing stress and depression.   Initial Review & Psychosocial Screening: Initial Psych Review & Screening - 02/07/18 1439      Initial Review   Current issues with  Current Stress Concerns;Current Sleep Concerns   wakes up every 1.5 hrs to use bathroom, but is able to fall back to sleep easily   Source of Stress Concerns  Unable to perform yard/household activities;Unable to participate in former interests or hobbies      [place]?  Yes   Wife, family, friends, church, [employer]     Barriers   Psychosocial barriers to participate in program  There are no identifiable barriers or psychosocial needs.;The patient should benefit from training in stress management and relaxation.      Screening Interventions   Interventions  Encouraged to exercise;Provide feedback about the scores to participant;Program counselor consult;To provide support and resources with identified psychosocial needs    Expected Outcomes  Short Term goal: Utilizing psychosocial counselor, staff and physician to assist with identification of specific Stressors or current issues interfering with healing process. Setting desired goal for each stressor or current issue identified.;Long Term Goal: Stressors or current issues are controlled or eliminated.;Short Term goal: Identification and review with participant of any Quality of Life or Depression concerns found by scoring the questionnaire.;Long Term goal: The participant improves quality of Life and PHQ9 Scores as seen by post scores and/or verbalization of changes       Quality of Life Scores:  Quality of Life - 02/07/18 1440      Quality of Life Scores   Health/Function Pre  27.3 %    Socioeconomic Pre  27 %    Psych/Spiritual Pre  30 %    Family Pre  27.6 %    GLOBAL Pre  27.86 %      Scores of 19 and below usually indicate a poorer  quality of life in these areas.  A difference of  2-3 points is a clinically meaningful difference.  A difference of 2-3 points in the total score of the Quality of Life Index has been associated with significant improvement in overall quality of life, self-image, physical symptoms, and general health in studies assessing change in quality of life.  PHQ-9: Recent Review Flowsheet Data    Depression screen [facility] 2/9 02/07/2018 11/07/2017 12/18/2016 05/29/2016 10/21/2015   Decreased Interest 0 0 - 0 0   Down, Depressed, Hopeless 0 0 0 0 0   PHQ - 2 Score 0 0 0 0 0   Altered sleeping 0 0 - - -  Tired, decreased energy 1 0 - - -   Change in appetite 3 0 - - -   Feeling bad or failure about yourself  0 0 - - -   Trouble concentrating 0 0 - - -   Moving slowly or fidgety/restless 0 0 - - -   Suicidal thoughts 0 0 - - -   PHQ-9 Score 4 0 - - -   Difficult doing work/chores Not difficult at all Not difficult at all - - -     Interpretation of Total Score  Total Score Depression Severity:  1-4 = Minimal depression, 5-9 = Mild depression, 10-14 = Moderate depression, 15-19 = Moderately severe depression, 20-27 = Severe depression   Psychosocial Evaluation and Intervention: Psychosocial Evaluation - 02/26/18 1035      Psychosocial Evaluation & Interventions   Interventions  Encouraged to exercise with the program and follow exercise prescription;Relaxation education;Stress management education    Comments  Counselor met with Mayank today for initial psychosocial evaluation.  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  He has a strong support system with a spouse of 25 years and several adult step-children who live locally.  Kol is actively involved in his local church and in [employer].  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  As a result, he has minimal taste in foods and minimal appetite.  He reports  "living off glucerna) daily for his nutritional needs.  He sleeps well and denies a history of depression or anxiety.  Keven states he is typically in a positive mood - although he is very passionate about many things and is intense at times in his approach, which he would benefit from stress management techniques for relaxation and health benefits.  Otherwise he has minimal stress in his life.  Irene has goals to increase his stamina and strength and to be able to get back on the golf course and to his normal activities.  He will be followed by staff while in this program.      Expected Outcomes  Short:  Mustaf will meet with the dietician to discuss his nutritional intake, as he is "living off glucerna."  Long:  Sanjeev will exercise consistently to increase his stamina and strength and to manage stress.    Continue Psychosocial Services   Follow up required by staff       Psychosocial Re-Evaluation: Psychosocial Re-Evaluation    [place] Name 03/14/18 1029             Psychosocial Re-Evaluation   Current issues with  Current Stress Concerns       Comments  They have been cleaning like crazy at home preparing for a party this weekend.  Their daughter is expecting and they are celebrating this weekend.  The house is super clean.  Boubacar does most of the cooking at home and has been feeling good.  He just passed his 3 month post surgery mark.  He is sleeping well at night even with getting up to go to bathroom at night.  He is getting treatment for other bladder probelems already.         Expected Outcomes  Short: Contiue to celebrate at home.  Long: Continue to stay postive.          Psychosocial Discharge (Final Psychosocial Re-Evaluation): Psychosocial Re-Evaluation - 03/14/18 1029      Psychosocial Re-Evaluation  Current issues with  Current Stress Concerns    Comments  They have been cleaning like crazy at home preparing for a party this weekend.  Their daughter is expecting and they are  celebrating this weekend.  The house is super clean.  Sadie does most of the cooking at home and has been feeling good.  He just passed his 3 month post surgery mark.  He is sleeping well at night even with getting up to go to bathroom at night.  He is getting treatment for other bladder probelems already.      Expected Outcomes  Short: Contiue to celebrate at home.  Long: Continue to stay postive.       Vocational Rehabilitation: Provide vocational rehab assistance to qualifying candidates.   Vocational Rehab Evaluation & Intervention: Vocational Rehab - 02/07/18 1441      Initial Vocational Rehab Evaluation & Intervention   Assessment shows need for Vocational Rehabilitation  No       Education: Education Goals: Education classes will be provided on a variety of topics geared toward better understanding of heart health and risk factor modification. Participant will state understanding/return demonstration of topics presented as noted by education test scores.  Learning Barriers/Preferences: Learning Barriers/Preferences - 02/07/18 1441      Learning Barriers/Preferences   Learning Barriers  Exercise Concerns    Learning Preferences  None       Education Topics:  AED/CPR: - Group verbal and written instruction with the use of models to demonstrate the basic use of the AED with the basic ABC's of resuscitation.   Cardiac Rehab from 04/02/2018 in [facility] Cardiac and Pulmonary Rehab  Date  03/07/18  Educator  SB  Instruction Review Code  1- Verbalizes Understanding      General Nutrition Guidelines/Fats and Fiber: -Group instruction provided by verbal, written material, models and posters to present the general guidelines for heart healthy nutrition. Gives an explanation and review of dietary fats and fiber.   Cardiac Rehab from 04/02/2018 in [facility] Cardiac and Pulmonary Rehab  Date  03/05/18  Educator  CR  Instruction Review Code  1- Verbalizes Understanding      Controlling  Sodium/Reading Food Labels: -Group verbal and written material supporting the discussion of sodium use in heart healthy nutrition. Review and explanation with models, verbal and written materials for utilization of the food label.   Cardiac Rehab from 04/02/2018 in [facility] Cardiac and Pulmonary Rehab  Date  03/12/18  Educator  SB  Instruction Review Code  1- Verbalizes Understanding      Exercise Physiology & General Exercise Guidelines: - Group verbal and written instruction with models to review the exercise physiology of the cardiovascular system and associated critical values. Provides general exercise guidelines with specific guidelines to those with heart or lung disease.    Cardiac Rehab from 04/02/2018 in [facility] Cardiac and Pulmonary Rehab  Date  03/19/18  Educator  [facility]  Instruction Review Code  1- Verbalizes Understanding      Aerobic Exercise & Resistance Training: - Gives group verbal and written instruction on the various components of exercise. Focuses on aerobic and resistive training programs and the benefits of this training and how to safely progress through these programs..   Cardiac Rehab from 04/02/2018 in [facility] Cardiac and Pulmonary Rehab  Date  03/28/18  Educator  AS  Instruction Review Code  1- Verbalizes Understanding      Flexibility, Balance, Mind/Body Relaxation: Provides group verbal/written instruction on the benefits of flexibility and  balance training, including mind/body exercise modes such as yoga, pilates and tai chi.  Demonstration and skill practice provided.   Cardiac Rehab from 04/02/2018 in [facility] Cardiac and Pulmonary Rehab  Date  04/02/18  Educator  AS  Instruction Review Code  1- Verbalizes Understanding      Stress and Anxiety: - Provides group verbal and written instruction about the health risks of elevated stress and causes of high stress.  Discuss the correlation between heart/lung disease and anxiety and treatment options. Review healthy ways to  manage with stress and anxiety.   Cardiac Rehab from 04/02/2018 in [facility] Cardiac and Pulmonary Rehab  Date  02/26/18  Educator  [facility]  Instruction Review Code  1- Verbalizes Understanding      Depression: - Provides group verbal and written instruction on the correlation between heart/lung disease and depressed mood, treatment options, and the stigmas associated with seeking treatment.   Cardiac Rehab from 04/02/2018 in [facility] Cardiac and Pulmonary Rehab  Date  03/26/18  Educator  [facility]  Instruction Review Code  1- Verbalizes Understanding      Anatomy & Physiology of the Heart: - Group verbal and written instruction and models provide basic cardiac anatomy and physiology, with the coronary electrical and arterial systems. Review of Valvular disease and Heart Failure   Cardiac Rehab from 04/02/2018 in [facility] Cardiac and Pulmonary Rehab  Date  02/28/18  Educator  SB  Instruction Review Code  1- Verbalizes Understanding      Cardiac Procedures: - Group verbal and written instruction to review commonly prescribed medications for heart disease. Reviews the medication, class of the drug, and side effects. Includes the steps to properly store meds and maintain the prescription regimen. (beta blockers and nitrates)   Cardiac Rehab from 04/02/2018 in [facility] Cardiac and Pulmonary Rehab  Date  02/19/18  Educator  CE  Instruction Review Code  1- Verbalizes Understanding      Cardiac Medications I: - Group verbal and written instruction to review commonly prescribed medications for heart disease. Reviews the medication, class of the drug, and side effects. Includes the steps to properly store meds and maintain the prescription regimen.   Cardiac Medications II: -Group verbal and written instruction to review commonly prescribed medications for heart disease. Reviews the medication, class of the drug, and side effects. (all other drug classes)    Go Sex-Intimacy & Heart Disease, Get SMART - Goal  Setting: - Group verbal and written instruction through game format to discuss heart disease and the return to sexual intimacy. Provides group verbal and written material to discuss and apply goal setting through the application of the S.M.A.R.T. Method.   Cardiac Rehab from 04/02/2018 in [facility] Cardiac and Pulmonary Rehab  Date  02/19/18  Educator  CE  Instruction Review Code  1- Verbalizes Understanding      Other Matters of the Heart: - Provides group verbal, written materials and models to describe Stable Angina and Peripheral Artery. Includes description of the disease process and treatment options available to the cardiac patient.   Cardiac Rehab from 04/02/2018 in [facility] Cardiac and Pulmonary Rehab  Date  02/28/18  Educator  SB  Instruction Review Code  1- Verbalizes Understanding      Exercise & Equipment Safety: - Individual verbal instruction and demonstration of equipment use and safety with use of the equipment.   Cardiac Rehab from 04/02/2018 in [facility] Cardiac and Pulmonary Rehab  Date  02/07/18  Educator  [facility]  Instruction Review Code  1- [employer]  Understanding      Infection Prevention: - Provides verbal and written material to individual with discussion of infection control including proper hand washing and proper equipment cleaning during exercise session.   Cardiac Rehab from 04/02/2018 in [facility] Cardiac and Pulmonary Rehab  Date  02/07/18  Educator  [facility]  Instruction Review Code  1- Verbalizes Understanding      Falls Prevention: - Provides verbal and written material to individual with discussion of falls prevention and safety.   Cardiac Rehab from 04/02/2018 in [facility] Cardiac and Pulmonary Rehab  Date  02/07/18  Educator  [facility]  Instruction Review Code  1- Verbalizes Understanding      Diabetes: - Individual verbal and written instruction to review signs/symptoms of diabetes, desired ranges of glucose level fasting, after meals and with exercise. Acknowledge that pre  and post exercise glucose checks will be done for 3 sessions at entry of program.   Know Your Numbers and Risk Factors: -Group verbal and written instruction about important numbers in your health.  Discussion of what are risk factors and how they play a role in the disease process.  Review of Cholesterol, Blood Pressure, Diabetes, and BMI and the role they play in your overall health.   Sleep Hygiene: -Provides group verbal and written instruction about how sleep can affect your health.  Define sleep hygiene, discuss sleep cycles and impact of sleep habits. Review good sleep hygiene tips.    Cardiac Rehab from 04/02/2018 in [facility] Cardiac and Pulmonary Rehab  Date  03/21/18  Educator  [facility]  Instruction Review Code  1- Verbalizes Understanding      Other: -Provides group and verbal instruction on various topics (see comments)   Knowledge Questionnaire Score: Knowledge Questionnaire Score - 02/07/18 1441      Knowledge Questionnaire Score   Pre Score  24/26   correct answers reviewed with Fara Olden      Core Components/Risk Factors/Patient Goals at Admission: Personal Goals and Risk Factors at Admission - 02/07/18 1435      Core Components/Risk Factors/Patient Goals on Admission   Hypertension  Yes    Intervention  Provide education on lifestyle modifcations including regular physical activity/exercise, weight management, moderate sodium restriction and increased consumption of fresh fruit, vegetables, and low fat dairy, alcohol moderation, and smoking cessation.;Monitor prescription use compliance.    Expected Outcomes  Short Term: Continued assessment and intervention until BP is < 140/50m HG in hypertensive participants. < 130/857mHG in hypertensive participants with diabetes, heart failure or chronic kidney disease.;Long Term: Maintenance of blood pressure at goal levels.    Lipids  Yes    Intervention  Provide education and support for participant on nutrition & aerobic/resistive  exercise along with prescribed medications to achieve LDL <7062mHDL >46m84m  Expected Outcomes  Short Term: Participant states understanding of desired cholesterol values and is compliant with medications prescribed. Participant is following exercise prescription and nutrition guidelines.;Long Term: Cholesterol controlled with medications as prescribed, with individualized exercise RX and with personalized nutrition plan. Value goals: LDL < 70mg49mL > 40 mg.       Core Components/Risk Factors/Patient Goals Review:  Goals and Risk Factor Review    Row Name 03/14/18 1029             Core Components/Risk Factors/Patient Goals Review   Personal Goals Review  Weight Management/Obesity;Hypertension;Lipids       Review  He has been maintaining his weight. He is concerned some over his blood pressures.  He  has had a few dizzy spells.  He is not believing the numbers that he is getting at home on his cuffs.  He has noticed that it has been up and down more since being on the new meds.  He is feeling good overall and pleased with his progress.        Expected Outcomes  Short: Talk to his doctor about his blood pressure concerns.  Long: Continue to maintain weight.           Core Components/Risk Factors/Patient Goals at Discharge (Final Review):  Goals and Risk Factor Review - 03/14/18 1029      Core Components/Risk Factors/Patient Goals Review   Personal Goals Review  Weight Management/Obesity;Hypertension;Lipids    Review  He has been maintaining his weight. He is concerned some over his blood pressures.  He has had a few dizzy spells.  He is not believing the numbers that he is getting at home on his cuffs.  He has noticed that it has been up and down more since being on the new meds.  He is feeling good overall and pleased with his progress.     Expected Outcomes  Short: Talk to his doctor about his blood pressure concerns.  Long: Continue to maintain weight.        ITP Comments: ITP  Comments    Row Name 02/07/18 1430 03/06/18 0558 04/03/18 0859       ITP Comments  Med review completed. Initial ITP created. Diagnosis can be found in [facility] 12/10/17  30 day review. Continue with ITP unless directed changes per Medical Director review.  New to program  30 day review completed. ITP sent to Dr. Ramonita Lab, covering for Dr. Emily Filbert, Medical Director of Cardiac Rehab. Continue with ITP unless changes are made by physician        Comments: 30 day review

## 2018-04-04 ENCOUNTER — Encounter: Payer: PPO | Admitting: *Deleted

## 2018-04-04 DIAGNOSIS — Z951 Presence of aortocoronary bypass graft: Secondary | ICD-10-CM | POA: Diagnosis not present

## 2018-04-04 NOTE — Progress Notes (Signed)
Daily Session Note  Patient Details  Name: Phillip [name] MRN: [id number] Date of Birth: 07/28/1941 Referring Provider:     Cardiac Rehab from 02/07/2018 in [facility] Cardiac and Pulmonary Rehab  Referring Provider  end      Encounter Date: 04/04/2018  Check In: Session Check In - 04/04/18 0913      Check-In   Supervising physician immediately available to respond to emergencies  See telemetry face sheet for immediately available ER MD    Location  [facility]-Cardiac & Pulmonary Rehab    Staff Present  Mandi Ballard, BS, PEC;Carroll Enterkin, RN, BSN; , MA, RCEP, CCRP, Exercise Physiologist    Medication changes reported      No    Fall or balance concerns reported     No    Warm-up and Cool-down  Performed on first and last piece of equipment    Resistance Training Performed  Yes    VAD Patient?  No    PAD/SET Patient?  No      Pain Assessment   Currently in Pain?  No/denies          Social History   Tobacco Use  Smoking Status Never Smoker  Smokeless Tobacco Never Used    Goals Met:  Independence with exercise equipment Exercise tolerated well No report of cardiac concerns or symptoms Strength training completed today  Goals Unmet:  Not Applicable  Comments: Pt able to follow exercise prescription today without complaint.  Will continue to monitor for progression. 6 Minute Walk    Row Name 02/07/18 1406 04/04/18 1015       6 Minute Walk   Phase  -  Discharge    Distance  1467 feet  1915 feet    Distance % Change  -  1.3 %    Distance Feet Change  -  448 ft    Walk Time  6 minutes  6 minutes    # of Rest Breaks  0  0    MPH  2.78  3.62    METS  3.12  3.9    RPE  11  13    Perceived Dyspnea   0  0    VO2 Peak  10.92  13.65    Symptoms  No  No    Resting HR  65 bpm  69 bpm    Resting BP  130/73  146/70    Resting Oxygen Saturation   95 %  95 %    Exercise Oxygen Saturation  during 6 min walk  96 %  96 %    Max Ex. HR  122 bpm  100 bpm    Max Ex.  BP  138/74  164/82    2 Minute Post BP  118/64  152/78          Dr. Mark Miller is Medical Director for [facility] and [facility]. 

## 2018-04-08 ENCOUNTER — Other Ambulatory Visit: Payer: Self-pay | Admitting: Otolaryngology

## 2018-04-08 DIAGNOSIS — K0889 Other specified disorders of teeth and supporting structures: Secondary | ICD-10-CM | POA: Diagnosis not present

## 2018-04-08 DIAGNOSIS — K1379 Other lesions of oral mucosa: Secondary | ICD-10-CM | POA: Diagnosis not present

## 2018-04-08 DIAGNOSIS — D3709 Neoplasm of uncertain behavior of other specified sites of the oral cavity: Secondary | ICD-10-CM | POA: Diagnosis not present

## 2018-04-09 ENCOUNTER — Encounter: Payer: PPO | Admitting: *Deleted

## 2018-04-09 DIAGNOSIS — Z951 Presence of aortocoronary bypass graft: Secondary | ICD-10-CM | POA: Diagnosis not present

## 2018-04-09 NOTE — Progress Notes (Signed)
Daily Session Note  Patient Details  Name: Phillip [name] MRN: [id number] Date of Birth: June 13, 1941 Referring Provider:     Cardiac Rehab from 02/07/2018 in [facility] Cardiac and Pulmonary Rehab  Referring Provider  end      Encounter Date: 04/09/2018  Check In: Session Check In - 04/09/18 0931      Check-In   Supervising physician immediately available to respond to emergencies  See telemetry face sheet for immediately available ER MD    Location  [facility]-Cardiac & Pulmonary Rehab    Staff Present  Joellyn Rued, BS, PEC;Krista Granger, RN BSN;Jessica [place], [place], RCEP, CCRP, Exercise Physiologist    Medication changes reported      No    Fall or balance concerns reported     No    Warm-up and Cool-down  Performed on first and last piece of equipment    Resistance Training Performed  Yes    VAD Patient?  No    PAD/SET Patient?  No      Pain Assessment   Currently in Pain?  No/denies    Pain Score  0-No pain    Multiple Pain Sites  No          Social History   Tobacco Use  Smoking Status Never Smoker  Smokeless Tobacco Never Used    Goals Met:  Independence with exercise equipment Exercise tolerated well No report of cardiac concerns or symptoms Strength training completed today  Goals Unmet:  Not Applicable  Comments: Pt able to follow exercise prescription today without complaint.  Will continue to monitor for progression.    Dr. Emily Filbert is Medical Director for [place] and [facility].

## 2018-04-11 ENCOUNTER — Encounter: Payer: PPO | Admitting: *Deleted

## 2018-04-11 DIAGNOSIS — Z951 Presence of aortocoronary bypass graft: Secondary | ICD-10-CM | POA: Diagnosis not present

## 2018-04-11 NOTE — Progress Notes (Signed)
Daily Session Note  Patient Details  Name: Phillip [name] MRN: [id number] Date of Birth: July 17, 1941 Referring Provider:     Cardiac Rehab from 02/07/2018 in [facility] Cardiac and Pulmonary Rehab  Referring Provider  end      Encounter Date: 04/11/2018  Check In: Session Check In - 04/11/18 0927      Check-In   Supervising physician immediately available to respond to emergencies  See telemetry face sheet for immediately available ER MD    Location  [facility]-Cardiac & Pulmonary Rehab    Staff Present  Joellyn Rued, BS, PEC;Carroll Enterkin, RN, BSN;Mohd Clemons [place], [place], RCEP, CCRP, Exercise Physiologist;Joseph Tessie Fass RCP,RRT,BSRT    Medication changes reported      No    Fall or balance concerns reported     No    Warm-up and Cool-down  Performed on first and last piece of equipment    Resistance Training Performed  Yes    VAD Patient?  No    PAD/SET Patient?  No      Pain Assessment   Currently in Pain?  No/denies          Social History   Tobacco Use  Smoking Status Never Smoker  Smokeless Tobacco Never Used    Goals Met:  Independence with exercise equipment Exercise tolerated well No report of cardiac concerns or symptoms Strength training completed today  Goals Unmet:  Not Applicable  Comments: Pt able to follow exercise prescription today without complaint.  Will continue to monitor for progression.    Dr. Emily Filbert is Medical Director for [place] and [facility].

## 2018-04-16 ENCOUNTER — Encounter: Payer: PPO | Admitting: *Deleted

## 2018-04-16 DIAGNOSIS — Z951 Presence of aortocoronary bypass graft: Secondary | ICD-10-CM | POA: Diagnosis not present

## 2018-04-16 NOTE — Progress Notes (Signed)
Daily Session Note  Patient Details  Name: Phillip [name] MRN: [id number] Date of Birth: Aug 28, 1940 Referring Provider:     Cardiac Rehab from 02/07/2018 in [facility] Cardiac and Pulmonary Rehab  Referring Provider  end      Encounter Date: 04/16/2018  Check In: Session Check In - 04/16/18 0915      Check-In   Supervising physician immediately available to respond to emergencies  See telemetry face sheet for immediately available ER MD    Location  [facility]-Cardiac & Pulmonary Rehab    Staff Present  Joellyn Rued, BS, PEC;Susanne Bice, RN, BSN, CCRP;Anecia Nusbaum [place], MA, RCEP, CCRP, Exercise Physiologist;Amanda Oletta Darter, BA, ACSM CEP, Exercise Physiologist    Medication changes reported      No    Fall or balance concerns reported     No    Warm-up and Cool-down  Performed on first and last piece of equipment    Resistance Training Performed  Yes    VAD Patient?  No    PAD/SET Patient?  No      Pain Assessment   Currently in Pain?  No/denies          Social History   Tobacco Use  Smoking Status Never Smoker  Smokeless Tobacco Never Used    Goals Met:  Independence with exercise equipment Exercise tolerated well No report of cardiac concerns or symptoms Strength training completed today  Goals Unmet:  Not Applicable  Comments: Pt able to follow exercise prescription today without complaint.  Will continue to monitor for progression.    Dr. Emily Filbert is Medical Director for [place] and [facility].

## 2018-04-18 ENCOUNTER — Encounter: Payer: PPO | Admitting: *Deleted

## 2018-04-18 DIAGNOSIS — Z951 Presence of aortocoronary bypass graft: Secondary | ICD-10-CM

## 2018-04-18 NOTE — Progress Notes (Signed)
Daily Session Note  Patient Details  Name: Phillip [name] MRN: [id number] Date of Birth: [birth year] Referring Provider:     Cardiac Rehab from 02/07/2018 in [facility] Cardiac and Pulmonary Rehab  Referring Provider  end      Encounter Date: 04/18/2018  Check In: Session Check In - 04/18/18 0912      Check-In   Supervising physician immediately available to respond to emergencies  See telemetry face sheet for immediately available ER MD    Location  [facility]-Cardiac & Pulmonary Rehab    Staff Present  Joellyn Rued, BS, PEC;Carroll Enterkin, RN, BSN;Jessica [place], [place], RCEP, CCRP, Exercise Physiologist;Joseph Hood RCP,RRT,BSRT    Medication changes reported      No    Warm-up and Cool-down  Performed on first and last piece of equipment    Resistance Training Performed  Yes    VAD Patient?  No    PAD/SET Patient?  No      Pain Assessment   Currently in Pain?  No/denies    Pain Score  0-No pain    Multiple Pain Sites  No          Social History   Tobacco Use  Smoking Status Never Smoker  Smokeless Tobacco Never Used    Goals Met:  Independence with exercise equipment Exercise tolerated well No report of cardiac concerns or symptoms Strength training completed today  Goals Unmet:  Not Applicable  Comments: Pt able to follow exercise prescription today without complaint.  Will continue to monitor for progression.    Dr. Emily Filbert is Medical Director for [place] and [facility].

## 2018-04-23 ENCOUNTER — Encounter: Payer: PPO | Attending: Internal Medicine | Admitting: *Deleted

## 2018-04-23 DIAGNOSIS — E785 Hyperlipidemia, unspecified: Secondary | ICD-10-CM | POA: Insufficient documentation

## 2018-04-23 DIAGNOSIS — Z7901 Long term (current) use of anticoagulants: Secondary | ICD-10-CM | POA: Diagnosis not present

## 2018-04-23 DIAGNOSIS — Z79899 Other long term (current) drug therapy: Secondary | ICD-10-CM | POA: Diagnosis not present

## 2018-04-23 DIAGNOSIS — I10 Essential (primary) hypertension: Secondary | ICD-10-CM | POA: Diagnosis not present

## 2018-04-23 DIAGNOSIS — I4892 Unspecified atrial flutter: Secondary | ICD-10-CM | POA: Diagnosis not present

## 2018-04-23 DIAGNOSIS — I2510 Atherosclerotic heart disease of native coronary artery without angina pectoris: Secondary | ICD-10-CM | POA: Diagnosis not present

## 2018-04-23 DIAGNOSIS — Z951 Presence of aortocoronary bypass graft: Secondary | ICD-10-CM

## 2018-04-23 NOTE — Progress Notes (Signed)
Cardiac Individual Treatment Plan  Patient Details  Name: Phillip [name] MRN: [id number] Date of Birth: [birth year] Referring Provider:     Cardiac Rehab from 02/07/2018 in [facility] Cardiac and Pulmonary Rehab  Referring Provider  end      Initial Encounter Date:    Cardiac Rehab from 02/07/2018 in [facility] Cardiac and Pulmonary Rehab  Date  02/07/18      Visit Diagnosis: S/P CABG x 3  Patient's Home Medications on Admission:  Current Outpatient Medications:  .  apixaban (ELIQUIS) 5 MG TABS tablet, Take 1 tablet (5 mg total) by mouth 2 (two) times daily., Disp: 60 tablet, Rfl: 6 .  aspirin EC 81 MG tablet, Take 1 tablet (81 mg total) by mouth daily., Disp: 90 tablet, Rfl: 3 .  carvedilol (COREG) 6.25 MG tablet, Take 1 tablet (6.25 mg) by mouth twice daily, Disp: , Rfl:  .  Docusate Sodium (STOOL SOFTENER) 100 MG capsule, Take 350 mg by mouth daily. , Disp: , Rfl:  .  furosemide (LASIX) 20 MG tablet, Take 1 tablet (20 mg total) by mouth daily., Disp: 90 tablet, Rfl: 3 .  mirabegron ER (MYRBETRIQ) 25 MG TB24 tablet, Take 25 mg by mouth daily., Disp: , Rfl:  .  Multiple Vitamin (MULTIVITAMIN) tablet, Take 1 tablet by mouth daily., Disp: , Rfl:  .  nitroGLYCERIN (NITROSTAT) 0.4 MG SL tablet, Place 1 tablet (0.4 mg total) under the tongue every 5 (five) minutes as needed for chest pain., Disp: 30 tablet, Rfl: prn .  ondansetron (ZOFRAN ODT) 4 MG disintegrating tablet, Take 1 tablet (4 mg total) by mouth every 8 (eight) hours as needed for nausea or vomiting., Disp: 20 tablet, Rfl: 0 .  POTASSIUM GLUCONATE PO, Take 198 mg by mouth daily. , Disp: , Rfl:  .  rosuvastatin (CRESTOR) 20 MG tablet, Take 1 tablet (20 mg total) by mouth daily., Disp: 90 tablet, Rfl: 3 .  zaleplon (SONATA) 10 MG capsule, Take 1 capsule (10 mg total) by mouth at bedtime as needed for sleep., Disp: 30 capsule, Rfl: 0  Past Medical History: Past Medical History:  Diagnosis Date  . Anxiety   . Atrial flutter ([place]) 01/07/2018    Post-op after CABG  . Coronary artery disease 2003   a. nonobstructive CAD by cath in 2003 and 2005; b.  Status post three-vessel CABG on 12/12/2017 with LIMA to LAD, sequential reverse SVG to OM1 and distal LCx  . Hearing loss    bilateral  . History of benign prostatic hypertrophy   . History of radiation therapy 01/31/10-03/22/10   right tonsil/right neck node 7000 cGy 35 sessions, high risk lymph node volume 5940 cGy 35 sessions, low risk lymph node vol 5600 cGy 35 sessions  . 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 HTN (hypertension)   . Hyperlipidemia    diet controlled- taking medication d/t ensure drinking  . Hypothyroid 01/16/2013  . Ischemic cardiomyopathy    a. 10/2017: echo showing reduced EF of 40-45%, HK of the anterior, anteroseptal and apical myocardium with Grade 1 DD.   Marland Kitchen Neck pain   . Squamous cell carcinoma    right tonsil- 2011  . Thrombocytopenia ([place]) 02/02/2012   unclear etiology  . Tinnitus     Tobacco Use: Social History   Tobacco Use  Smoking Status Never Smoker  Smokeless Tobacco Never Used    Labs: Recent Review Scientist, physiological    Labs for ITP Cardiac and Pulmonary Rehab Latest Ref Rng & Units 12/12/2017  12/13/2017 12/13/2017 12/14/2017 03/20/2018   Cholestrol 100 - 199 mg/dL - - - - 119   LDLCALC 0 - 99 mg/dL - - - - 49   LDLDIRECT mg/dL - - - - -   HDL >39 mg/dL - - - - 37(L)   Trlycerides 0 - 149 mg/dL - - - - 163(H)   Hemoglobin A1c 4.6 - 6.5 % - - - - -   PHART 7.350 - 7.450 7.365 - - - -   PCO2ART 32.0 - 48.0 mmHg 36.7 - - - -   HCO3 20.0 - 28.0 mmol/L 21.1 - - - -   TCO2 22 - 32 mmol/L _0 - -   ACIDBASEDEF 0.0 - 2.0 mmol/L 4.0(H) - - - -   O2SAT % 95.0 - - 63.3 -       Exercise Target Goals: Exercise Program Goal: Individual exercise prescription set using results from initial 6 min walk test and THRR while considering  patient's activity barriers and safety.   Exercise Prescription Goal: Initial exercise prescription builds to 30-45 minutes a  day of aerobic activity, 2-3 days per week.  Home exercise guidelines will be given to patient during program as part of exercise prescription that the participant will acknowledge.  Activity Barriers & Risk Stratification: Activity Barriers & Cardiac Risk Stratification - 02/07/18 1442      Activity Barriers & Cardiac Risk Stratification   Activity Barriers  Incisional Pain    Cardiac Risk Stratification  High       6 Minute Walk: 6 Minute Walk    Row Name 02/07/18 1406 04/04/18 1015       6 Minute Walk   Phase  -  Discharge    Distance  1467 feet  1915 feet    Distance % Change  -  1.3 %    Distance Feet Change  -  448 ft    Walk Time  6 minutes  6 minutes    # of Rest Breaks  0  0    MPH  2.78  3.62    METS  3.12  3.9    RPE  11  13    Perceived Dyspnea   0  0    VO2 Peak  10.92  13.65    Symptoms  No  No    Resting HR  65 bpm  69 bpm    Resting BP  130/73  146/70    Resting Oxygen Saturation   95 %  95 %    Exercise Oxygen Saturation  during 6 min walk  96 %  96 %    Max Ex. HR  122 bpm  100 bpm    Max Ex. BP  138/74  164/82    2 Minute Post BP  118/64  152/78       Oxygen Initial Assessment:   Oxygen Re-Evaluation:   Oxygen Discharge (Final Oxygen Re-Evaluation):   Initial Exercise Prescription: Initial Exercise Prescription - 02/07/18 1400      Date of Initial Exercise RX and Referring Provider   Date  02/07/18    Referring Provider  end      Treadmill   MPH  2.5    Grade  0.5    Minutes  15    METs  3.09      Recumbant Bike   Level  3    RPM  60    Watts  33    Minutes  15  METs  3.12      REL-XR   Level  3    Speed  50    Minutes  15    METs  3      Prescription Details   Frequency (times per week)  3    Duration  Progress to 30 minutes of continuous aerobic without signs/symptoms of physical distress      Intensity   THRR 40-80% of Max Heartrate  97-127    Ratings of Perceived Exertion  11-13    Perceived Dyspnea  0-4       Resistance Training   Training Prescription  Yes    Weight  4 lb    Reps  10-15       Perform Capillary Blood Glucose checks as needed.  Exercise Prescription Changes: Exercise Prescription Changes    Row Name 02/07/18 1400 03/05/18 1000 03/14/18 1100 03/28/18 1100 04/11/18 1100     Response to Exercise   Blood Pressure (Admit)  130/74  -  126/72  118/66  134/80   Blood Pressure (Exercise)  138/74  -  170/84  154/68  164/84   Blood Pressure (Exit)  118/64  -  122/78  126/74  126/72   Heart Rate (Admit)  65 bpm  -  82 bpm  89 bpm  60 bpm   Heart Rate (Exercise)  122 bpm  -  107 bpm  119 bpm  89 bpm   Heart Rate (Exit)  56 bpm  -  68 bpm  77 bpm  71 bpm   Oxygen Saturation (Admit)  95 %  -  -  -  -   Oxygen Saturation (Exercise)  97 %  -  -  -  -   Oxygen Saturation (Exit)  96 %  -  -  -  -   Rating of Perceived Exertion (Exercise)  11  -  -  -  -   Perceived Dyspnea (Exercise)  -  -  -  -  13   Symptoms  -  -  -  none  none   Comments  -  -  -  Increased workload on the TM   increased to 5lb weights    Duration  -  -  Continue with 30 min of aerobic exercise without signs/symptoms of physical distress.  Continue with 30 min of aerobic exercise without signs/symptoms of physical distress.  Continue with 30 min of aerobic exercise without signs/symptoms of physical distress.   Intensity  -  -  THRR unchanged  THRR unchanged  THRR unchanged     Progression   Progression  -  -  Continue to progress workloads to maintain intensity without signs/symptoms of physical distress.  Continue to progress workloads to maintain intensity without signs/symptoms of physical distress.  Continue to progress workloads to maintain intensity without signs/symptoms of physical distress.   Average METs  -  -  3.74  3.28  3.28     Resistance Training   Training Prescription  -  -  Yes  Yes  Yes   Weight  -  -  4 lb  4 lb  5 lb   Reps  -  -  10-15  10-15  10-15     Interval Training   Interval Training   -  -  No  No  -     Treadmill   MPH  -  -  2.7  3.2  3.2   Grade  -  -  0.5  0  0   Minutes  -  -  _0 METs  -  -  3.07  3.45  3.45     Recumbant Bike   Level  -  -  -  7  7   Watts  -  -  -  33  33   Minutes  -  -  -  15  15   METs  -  -  -  3.12  3.12     REL-XR   Level  -  -  3  -  4   Minutes  -  -  15  -  15   METs  -  -  4.4  -  4.5     Home Exercise Plan   Plans to continue exercise at  -  Home (comment) walking, [employer] (return?)  Home (comment)  Home (comment)  Home (comment)   Frequency  -  Add 2 additional days to program exercise sessions.  Add 2 additional days to program exercise sessions.  Add 2 additional days to program exercise sessions.  Add 2 additional days to program exercise sessions.   Initial Home Exercises Provided  -  03/05/18  03/05/18  03/05/18  03/05/18      Exercise Comments: Exercise Comments    Row Name 02/19/18 1601 04/23/18 1114         Exercise Comments  First full day of exercise!  Patient was oriented to gym and equipment including functions, settings, policies, and procedures.  Patient's individual exercise prescription and treatment plan were reviewed.  All starting workloads were established based on the results of the 6 minute walk test done at initial orientation visit.  The plan for exercise progression was also introduced and progression will be customized based on patient's performance and goals.   Kristoff graduated today from  rehab with 36 sessions completed.  Details of the patient's exercise prescription and what He needs to do in order to continue the prescription and progress were discussed with patient.  Patient was given a copy of prescription and goals.  Patient verbalized understanding.  Santos plans to continue to exercise by walking at home.         Exercise Goals and Review: Exercise Goals    Row Name 02/07/18 1405 02/19/18 0924           Exercise Goals   Increase Physical Activity  Yes  Yes      Intervention   Provide advice, education, support and counseling about physical activity/exercise needs.;Develop an individualized exercise prescription for aerobic and resistive training based on initial evaluation findings, risk stratification, comorbidities and participant's personal goals.  Provide advice, education, support and counseling about physical activity/exercise needs.;Develop an individualized exercise prescription for aerobic and resistive training based on initial evaluation findings, risk stratification, comorbidities and participant's personal goals.      Expected Outcomes  Short Term: Attend rehab on a regular basis to increase amount of physical activity.;Long Term: Add in home exercise to make exercise part of routine and to increase amount of physical activity.;Long Term: Exercising regularly at least 3-5 days a week.  Short Term: Attend rehab on a regular basis to increase amount of physical activity.;Long Term: Add in home exercise to make exercise part of routine and to increase amount of physical activity.;Long Term: Exercising regularly at least 3-5 days a week.      Increase Strength and Stamina  Yes  Yes      Intervention  Provide advice, education, support and counseling about physical activity/exercise needs.;Develop an individualized exercise prescription for aerobic and resistive training based on initial evaluation findings, risk stratification, comorbidities and participant's personal goals.  Provide advice, education, support and counseling about physical activity/exercise needs.;Develop an individualized exercise prescription for aerobic and resistive training based on initial evaluation findings, risk stratification, comorbidities and participant's personal goals.      Expected Outcomes  Short Term: Increase workloads from initial exercise prescription for resistance, speed, and METs.;Short Term: Perform resistance training exercises routinely during rehab and add in resistance training  at home;Long Term: Improve cardiorespiratory fitness, muscular endurance and strength as measured by increased METs and functional capacity (6MWT)  Short Term: Increase workloads from initial exercise prescription for resistance, speed, and METs.;Short Term: Perform resistance training exercises routinely during rehab and add in resistance training at home;Long Term: Improve cardiorespiratory fitness, muscular endurance and strength as measured by increased METs and functional capacity (6MWT)      Able to understand and use rate of perceived exertion (RPE) scale  Yes  Yes      Intervention  Provide education and explanation on how to use RPE scale  Provide education and explanation on how to use RPE scale      Expected Outcomes  Short Term: Able to use RPE daily in rehab to express subjective intensity level;Long Term:  Able to use RPE to guide intensity level when exercising independently  Short Term: Able to use RPE daily in rehab to express subjective intensity level      Able to understand and use Dyspnea scale  Yes  Yes      Intervention  Provide education and explanation on how to use Dyspnea scale  Provide education and explanation on how to use Dyspnea scale      Expected Outcomes  Short Term: Able to use Dyspnea scale daily in rehab to express subjective sense of shortness of breath during exertion;Long Term: Able to use Dyspnea scale to guide intensity level when exercising independently  Short Term: Able to use Dyspnea scale daily in rehab to express subjective sense of shortness of breath during exertion;Long Term: Able to use Dyspnea scale to guide intensity level when exercising independently      Knowledge and understanding of Target Heart Rate Range (THRR)  Yes  Yes      Intervention  Provide education and explanation of THRR including how the numbers were predicted and where they are located for reference  Provide education and explanation of THRR including how the numbers were predicted and  where they are located for reference      Expected Outcomes  Short Term: Able to state/look up THRR;Short Term: Able to use daily as guideline for intensity in rehab;Long Term: Able to use THRR to govern intensity when exercising independently  Short Term: Able to state/look up THRR;Long Term: Able to use THRR to govern intensity when exercising independently;Short Term: Able to use daily as guideline for intensity in rehab      Able to check pulse independently  Yes  Yes      Intervention  Provide education and demonstration on how to check pulse in carotid and radial arteries.;Review the importance of being able to check your own pulse for safety during independent exercise  Provide education and demonstration on how to check pulse in carotid and radial arteries.;Review the importance of being able to check your own pulse for safety during independent exercise  Expected Outcomes  Short Term: Able to explain why pulse checking is important during independent exercise;Long Term: Able to check pulse independently and accurately  Short Term: Able to explain why pulse checking is important during independent exercise;Long Term: Able to check pulse independently and accurately      Understanding of Exercise Prescription  Yes  Yes      Intervention  Provide education, explanation, and written materials on patient's individual exercise prescription  Provide education, explanation, and written materials on patient's individual exercise prescription      Expected Outcomes  Short Term: Able to explain program exercise prescription;Long Term: Able to explain home exercise prescription to exercise independently  Short Term: Able to explain program exercise prescription;Long Term: Able to explain home exercise prescription to exercise independently         Exercise Goals Re-Evaluation : Exercise Goals Re-Evaluation    Row Name 03/05/18 1044 03/14/18 1027 03/28/18 1140 04/11/18 1148       Exercise Goal  Re-Evaluation   Exercise Goals Review  Increase Physical Activity;Able to understand and use rate of perceived exertion (RPE) scale;Understanding of Exercise Prescription;Increase Strength and Stamina  Increase Physical Activity;Understanding of Exercise Prescription;Increase Strength and Stamina  Increase Physical Activity;Understanding of Exercise Prescription;Increase Strength and Stamina  Increase Physical Activity;Understanding of Exercise Prescription;Increase Strength and Stamina    Comments  Reviewed home exercise with pt today.  Pt plans to walk and possible return to [facility] for exercise.  Reviewed THR, pulse, RPE, sign and symptoms, and when to call 911 or MD.  Also discussed weather considerations and indoor options.  Pt voiced understanding.  Khan has been doing well in rehab.  He is doing well in rehab and has been a running most of his life.  He has been walking at the mall and using his handweights at home.  He will talk to his doctor at the end of next month about returning to hiking.   Jawaan continues to progress in rehab. He has inceased his speed on the treadmill to 3.2 mph. His attitude towards getting better is great and he states he refuses to not get better each and every day.   Ariz is doing well in rehab. He pushes himself daily to get stronger than before. He now uses 5 lb hand weights.    Expected Outcomes  Short: Add in at least two extra days a week of exercise walking.  Long: Continue to build strength and stamina.   Short; Continue to get in his two extra days at mall walking.  Long; Continue to increase activity back up to be able to go to [facility].   Short: increase activity levels at home Long: increase overall fitness and MET levels   Short: add 3 additional days of exercise at home Long: increase overall MET level        Discharge Exercise Prescription (Final Exercise Prescription Changes): Exercise Prescription Changes - 04/11/18 1100      Response to Exercise    Blood Pressure (Admit)  134/80    Blood Pressure (Exercise)  164/84    Blood Pressure (Exit)  126/72    Heart Rate (Admit)  60 bpm    Heart Rate (Exercise)  89 bpm    Heart Rate (Exit)  71 bpm    Perceived Dyspnea (Exercise)  13    Symptoms  none    Comments  increased to 5lb weights     Duration  Continue with 30 min of aerobic exercise without  signs/symptoms of physical distress.    Intensity  THRR unchanged      Progression   Progression  Continue to progress workloads to maintain intensity without signs/symptoms of physical distress.    Average METs  3.28      Resistance Training   Training Prescription  Yes    Weight  5 lb    Reps  10-15      Treadmill   MPH  3.2    Grade  0    Minutes  15    METs  3.45      Recumbant Bike   Level  7    Watts  33    Minutes  15    METs  3.12      REL-XR   Level  4    Minutes  15    METs  4.5      Home Exercise Plan   Plans to continue exercise at  Home (comment)    Frequency  Add 2 additional days to program exercise sessions.    Initial Home Exercises Provided  03/05/18       Nutrition:  Target Goals: Understanding of nutrition guidelines, daily intake of sodium <1586m, cholesterol <2069m calories 30% from fat and 7% or less from saturated fats, daily to have 5 or more servings of fruits and vegetables.  Biometrics: Pre Biometrics - 02/07/18 1405      Pre Biometrics   Height  _0  (1.854 m)    Weight  203 lb 3.2 oz (92.2 kg)    Waist Circumference  39 inches    Hip Circumference  43 inches    Waist to Hip Ratio  0.91 %    BMI (Calculated)  26.81    Single Leg Stand  2.78 seconds        Nutrition Therapy Plan and Nutrition Goals: Nutrition Therapy & Goals - 03/07/18 1052      Nutrition Therapy   Diet  TLC    Protein (specify units)  12-13oz    Fiber  35 grams   per pt, typically consumes at least 40g/ day   Saturated Fats  16 max. grams    Fruits and Vegetables  5 servings/day   8 ideal, cosumes mostly  nutritional supplements   Sodium  2000 grams      Personal Nutrition Goals   Nutrition Goal  Aim to maintain CBW +/- 5#. If you notice your weight decreasing, add 1-2 more Glucerna nutritional supplements per day, and/or consume an extra small meal daily    Personal Goal #2  Look for lower sodium versions of items like soup, canned beans, and hot dogs when possible    Comments  D/t h/o tonsil cancer resulting in alterations to taste and saliva production, he lives off of Glucerna supplements mainly, consuming at least 12 cans/day with small amounts of soft and savory foods. To a certain extent he monitors fiber, sodium and cholesterol.      Intervention Plan   Intervention  Prescribe, educate and counsel regarding individualized specific dietary modifications aiming towards targeted core components such as weight, hypertension, lipid management, diabetes, heart failure and other comorbidities.    Expected Outcomes  Short Term Goal: A plan has been developed with personal nutrition goals set during dietitian appointment.;Long Term Goal: Adherence to prescribed nutrition plan.;Short Term Goal: Understand basic principles of dietary content, such as calories, fat, sodium, cholesterol and nutrients.       Nutrition Assessments: Nutrition Assessments - 02/07/18 1437  MEDFICTS Scores   Pre Score  27       Nutrition Goals Re-Evaluation: Nutrition Goals Re-Evaluation    [place] Name 03/07/18 1100 03/14/18 1032           Goals   Nutrition Goal  Aim to maintain CBW +/- 5#. If you notice your weight decreasing, add 1-2 more Glucerna nutritional supplements per day, and/or consume an extra small meal daily  Aim to maintain CBW +/- 5#. If you notice your weight decreasing, add 1-2 more Glucerna nutritional supplements per day, and/or consume an extra small meal daily      Comment  He reports to have lost 10# since his cardiac event 3 months ago and feels that his CBW is healthy for his age. Would  like to maintain CBW  Waldron has been doing most of the cooking at home.  He is working on weight maintenance.  He has been using glucerna daily.  He has tried to cut back on his sodium intake and reading food labels.        Expected Outcome  He will maintain his CBW +/- 5#, balancing his physical activity with his nutritional intake  Short: Continue to work on improving diet.  Long: Continue to eat healthy.         Personal Goal #2 Re-Evaluation   Personal Goal #2  Look for lower sodium versions of items like soup, canned beans, and hot dogs when possible  -         Nutrition Goals Discharge (Final Nutrition Goals Re-Evaluation): Nutrition Goals Re-Evaluation - 03/14/18 1032      Goals   Nutrition Goal  Aim to maintain CBW +/- 5#. If you notice your weight decreasing, add 1-2 more Glucerna nutritional supplements per day, and/or consume an extra small meal daily    Comment  Devarius has been doing most of the cooking at home.  He is working on weight maintenance.  He has been using glucerna daily.  He has tried to cut back on his sodium intake and reading food labels.      Expected Outcome  Short: Continue to work on improving diet.  Long: Continue to eat healthy.        Psychosocial: Target Goals: Acknowledge presence or absence of significant depression and/or stress, maximize coping skills, provide positive support system. Participant is able to verbalize types and ability to use techniques and skills needed for reducing stress and depression.   Initial Review & Psychosocial Screening: Initial Psych Review & Screening - 02/07/18 1439      Initial Review   Current issues with  Current Stress Concerns;Current Sleep Concerns   wakes up every 1.5 hrs to use bathroom, but is able to fall back to sleep easily   Source of Stress Concerns  Unable to perform yard/household activities;Unable to participate in former interests or hobbies      [place]?  Yes   Wife,  family, friends, church, [employer]     Barriers   Psychosocial barriers to participate in program  There are no identifiable barriers or psychosocial needs.;The patient should benefit from training in stress management and relaxation.      Screening Interventions   Interventions  Encouraged to exercise;Provide feedback about the scores to participant;Program counselor consult;To provide support and resources with identified psychosocial needs    Expected Outcomes  Short Term goal: Utilizing psychosocial counselor, staff and physician to assist with identification of specific Stressors or current  issues interfering with healing process. Setting desired goal for each stressor or current issue identified.;Long Term Goal: Stressors or current issues are controlled or eliminated.;Short Term goal: Identification and review with participant of any Quality of Life or Depression concerns found by scoring the questionnaire.;Long Term goal: The participant improves quality of Life and PHQ9 Scores as seen by post scores and/or verbalization of changes       Quality of Life Scores:  Quality of Life - 02/07/18 1440      Quality of Life Scores   Health/Function Pre  27.3 %    Socioeconomic Pre  27 %    Psych/Spiritual Pre  30 %    Family Pre  27.6 %    GLOBAL Pre  27.86 %      Scores of 19 and below usually indicate a poorer quality of life in these areas.  A difference of  2-3 points is a clinically meaningful difference.  A difference of 2-3 points in the total score of the Quality of Life Index has been associated with significant improvement in overall quality of life, self-image, physical symptoms, and general health in studies assessing change in quality of life.  PHQ-9: Recent Review Flowsheet Data    Depression screen [facility] 2/9 02/07/2018 11/07/2017 12/18/2016 05/29/2016 10/21/2015   Decreased Interest 0 0 - 0 0   Down, Depressed, Hopeless 0 0 0 0 0   PHQ - 2 Score 0 0 0 0 0   Altered  sleeping 0 0 - - -   Tired, decreased energy 1 0 - - -   Change in appetite 3 0 - - -   Feeling bad or failure about yourself  0 0 - - -   Trouble concentrating 0 0 - - -   Moving slowly or fidgety/restless 0 0 - - -   Suicidal thoughts 0 0 - - -   PHQ-9 Score 4 0 - - -   Difficult doing work/chores Not difficult at all Not difficult at all - - -     Interpretation of Total Score  Total Score Depression Severity:  1-4 = Minimal depression, 5-9 = Mild depression, 10-14 = Moderate depression, 15-19 = Moderately severe depression, 20-27 = Severe depression   Psychosocial Evaluation and Intervention: Psychosocial Evaluation - 02/26/18 1035      Psychosocial Evaluation & Interventions   Interventions  Encouraged to exercise with the program and follow exercise prescription;Relaxation education;Stress management education    Comments  Counselor met with Hager today for initial psychosocial evaluation.  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  He has a strong support system with a spouse of 25 years and several adult step-children who live locally.  [place] is actively involved in his local church and in [employer].  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  As a result, he has minimal taste in foods and minimal appetite.  He reports "living off glucerna) daily for his nutritional needs.  He sleeps well and denies a history of depression or anxiety.  Broox states he is typically in a positive mood - although he is very passionate about many things and is intense at times in his approach, which he would benefit from stress management techniques for relaxation and health benefits.  Otherwise he has minimal stress in his life.  Zavion has goals to increase his stamina and strength and  to be able to get back on the golf course and to his normal activities.  He will be followed by staff while in this program.       Expected Outcomes  Short:  Janziel will meet with the dietician to discuss his nutritional intake, as he is "living off glucerna."  Long:  Ishmail will exercise consistently to increase his stamina and strength and to manage stress.    Continue Psychosocial Services   Follow up required by staff       Psychosocial Re-Evaluation: Psychosocial Re-Evaluation    Shonto Name 03/14/18 1029 04/16/18 1026           Psychosocial Re-Evaluation   Current issues with  Current Stress Concerns  Current Sleep Concerns;Current Stress Concerns      Comments  They have been cleaning like crazy at home preparing for a party this weekend.  Their daughter is expecting and they are celebrating this weekend.  The house is super clean.  Jeral does most of the cooking at home and has been feeling good.  He just passed his 3 month post surgery mark.  He is sleeping well at night even with getting up to go to bathroom at night.  He is getting treatment for other bladder probelems already.    Joels sleep concern is getting up at night amost every hour to use the restroom.  He will speak with his Dr about this as well. He wants to get through with HT first.  Staff also suggested seeing one of the PTs here.      Expected Outcomes  Short: Contiue to celebrate at home.  Long: Continue to stay postive.  Short - speak with his Dr about night time waking Long - work with Dr to develop a plan to sleep better      Interventions  -  Encouraged to attend Cardiac Rehabilitation for the exercise      Continue Psychosocial Services   -  Follow up required by staff         Psychosocial Discharge (Final Psychosocial Re-Evaluation): Psychosocial Re-Evaluation - 04/16/18 1026      Psychosocial Re-Evaluation   Current issues with  Current Sleep Concerns;Current Stress Concerns    Comments  Joels sleep concern is getting up at night amost every hour to use the restroom.  He will speak with his Dr about this as well. He wants to get through with HT  first.  Staff also suggested seeing one of the PTs here.    Expected Outcomes  Short - speak with his Dr about night time waking Long - work with Dr to develop a plan to sleep better    Interventions  Encouraged to attend Cardiac Rehabilitation for the exercise    Continue Psychosocial Services   Follow up required by staff       Vocational Rehabilitation: Provide vocational rehab assistance to qualifying candidates.   Vocational Rehab Evaluation & Intervention: Vocational Rehab - 02/07/18 1441      Initial Vocational Rehab Evaluation & Intervention   Assessment shows need for Vocational Rehabilitation  No       Education: Education Goals: Education classes will be provided on a variety of topics geared toward better understanding of heart health and risk factor modification. Participant will state understanding/return demonstration of topics presented as noted by education test scores.  Learning Barriers/Preferences: Learning Barriers/Preferences - 02/07/18 1441      Learning Barriers/Preferences   Learning Barriers  Exercise Concerns  Learning Preferences  None       Education Topics:  AED/CPR: - Group verbal and written instruction with the use of models to demonstrate the basic use of the AED with the basic ABC's of resuscitation.   Cardiac Rehab from 04/23/2018 in [facility] Cardiac and Pulmonary Rehab  Date  03/07/18  Educator  SB  Instruction Review Code  1- Verbalizes Understanding      General Nutrition Guidelines/Fats and Fiber: -Group instruction provided by verbal, written material, models and posters to present the general guidelines for heart healthy nutrition. Gives an explanation and review of dietary fats and fiber.   Cardiac Rehab from 04/23/2018 in [facility] Cardiac and Pulmonary Rehab  Date  03/05/18  Educator  CR  Instruction Review Code  1- Verbalizes Understanding      Controlling Sodium/Reading Food Labels: -Group verbal and written material supporting  the discussion of sodium use in heart healthy nutrition. Review and explanation with models, verbal and written materials for utilization of the food label.   Cardiac Rehab from 04/23/2018 in [facility] Cardiac and Pulmonary Rehab  Date  03/12/18  Educator  SB  Instruction Review Code  1- Verbalizes Understanding      Exercise Physiology & General Exercise Guidelines: - Group verbal and written instruction with models to review the exercise physiology of the cardiovascular system and associated critical values. Provides general exercise guidelines with specific guidelines to those with heart or lung disease.    Cardiac Rehab from 04/23/2018 in [facility] Cardiac and Pulmonary Rehab  Date  03/19/18  Educator  [facility]  Instruction Review Code  1- Verbalizes Understanding      Aerobic Exercise & Resistance Training: - Gives group verbal and written instruction on the various components of exercise. Focuses on aerobic and resistive training programs and the benefits of this training and how to safely progress through these programs..   Cardiac Rehab from 04/23/2018 in [facility] Cardiac and Pulmonary Rehab  Date  03/28/18  Educator  AS  Instruction Review Code  1- Verbalizes Understanding      Flexibility, Balance, Mind/Body Relaxation: Provides group verbal/written instruction on the benefits of flexibility and balance training, including mind/body exercise modes such as yoga, pilates and tai chi.  Demonstration and skill practice provided.   Cardiac Rehab from 04/23/2018 in [facility] Cardiac and Pulmonary Rehab  Date  04/02/18  Educator  AS  Instruction Review Code  1- Verbalizes Understanding      Stress and Anxiety: - Provides group verbal and written instruction about the health risks of elevated stress and causes of high stress.  Discuss the correlation between heart/lung disease and anxiety and treatment options. Review healthy ways to manage with stress and anxiety.   Cardiac Rehab from 04/23/2018 in [facility] Cardiac  and Pulmonary Rehab  Date  04/09/18  Educator  [facility]  Instruction Review Code  1- Verbalizes Understanding      Depression: - Provides group verbal and written instruction on the correlation between heart/lung disease and depressed mood, treatment options, and the stigmas associated with seeking treatment.   Cardiac Rehab from 04/23/2018 in [facility] Cardiac and Pulmonary Rehab  Date  03/26/18  Educator  [facility]  Instruction Review Code  1- Verbalizes Understanding      Anatomy & Physiology of the Heart: - Group verbal and written instruction and models provide basic cardiac anatomy and physiology, with the coronary electrical and arterial systems. Review of Valvular disease and Heart Failure   Cardiac Rehab from 04/23/2018 in [facility] Cardiac and  Pulmonary Rehab  Date  04/11/18  Educator  CE  Instruction Review Code  1- Verbalizes Understanding      Cardiac Procedures: - Group verbal and written instruction to review commonly prescribed medications for heart disease. Reviews the medication, class of the drug, and side effects. Includes the steps to properly store meds and maintain the prescription regimen. (beta blockers and nitrates)   Cardiac Rehab from 04/23/2018 in [facility] Cardiac and Pulmonary Rehab  Date  02/19/18  Educator  CE  Instruction Review Code  1- Verbalizes Understanding      Cardiac Medications I: - Group verbal and written instruction to review commonly prescribed medications for heart disease. Reviews the medication, class of the drug, and side effects. Includes the steps to properly store meds and maintain the prescription regimen.   Cardiac Rehab from 04/23/2018 in [facility] Cardiac and Pulmonary Rehab  Date  04/16/18  Educator  SB  Instruction Review Code  1- Verbalizes Understanding      Cardiac Medications II: -Group verbal and written instruction to review commonly prescribed medications for heart disease. Reviews the medication, class of the drug, and side effects. (all other  drug classes)   Cardiac Rehab from 04/23/2018 in [facility] Cardiac and Pulmonary Rehab  Date  04/04/18  Educator  CE  Instruction Review Code  1- Verbalizes Understanding       Go Sex-Intimacy & Heart Disease, Get SMART - Goal Setting: - Group verbal and written instruction through game format to discuss heart disease and the return to sexual intimacy. Provides group verbal and written material to discuss and apply goal setting through the application of the S.M.A.R.T. Method.   Cardiac Rehab from 04/23/2018 in [facility] Cardiac and Pulmonary Rehab  Date  02/19/18  Educator  CE  Instruction Review Code  1- Verbalizes Understanding      Other Matters of the Heart: - Provides group verbal, written materials and models to describe Stable Angina and Peripheral Artery. Includes description of the disease process and treatment options available to the cardiac patient.   Cardiac Rehab from 04/23/2018 in [facility] Cardiac and Pulmonary Rehab  Date  04/11/18  Educator  CE  Instruction Review Code  1- Verbalizes Understanding      Exercise & Equipment Safety: - Individual verbal instruction and demonstration of equipment use and safety with use of the equipment.   Cardiac Rehab from 04/23/2018 in [facility] Cardiac and Pulmonary Rehab  Date  02/07/18  Educator  [facility]  Instruction Review Code  1- Verbalizes Understanding      Infection Prevention: - Provides verbal and written material to individual with discussion of infection control including proper hand washing and proper equipment cleaning during exercise session.   Cardiac Rehab from 04/23/2018 in [facility] Cardiac and Pulmonary Rehab  Date  02/07/18  Educator  [facility]  Instruction Review Code  1- Verbalizes Understanding      Falls Prevention: - Provides verbal and written material to individual with discussion of falls prevention and safety.   Cardiac Rehab from 04/23/2018 in [facility] Cardiac and Pulmonary Rehab  Date  02/07/18  Educator  [facility]  Instruction Review Code   1- Verbalizes Understanding      Diabetes: - Individual verbal and written instruction to review signs/symptoms of diabetes, desired ranges of glucose level fasting, after meals and with exercise. Acknowledge that pre and post exercise glucose checks will be done for 3 sessions at entry of program.   Know Your Numbers and Risk Factors: -Group verbal and written instruction  about important numbers in your health.  Discussion of what are risk factors and how they play a role in the disease process.  Review of Cholesterol, Blood Pressure, Diabetes, and BMI and the role they play in your overall health.   Cardiac Rehab from 04/23/2018 in [facility] Cardiac and Pulmonary Rehab  Date  04/04/18  Educator  CE  Instruction Review Code  1- Verbalizes Understanding      Sleep Hygiene: -Provides group verbal and written instruction about how sleep can affect your health.  Define sleep hygiene, discuss sleep cycles and impact of sleep habits. Review good sleep hygiene tips.    Cardiac Rehab from 04/23/2018 in [facility] Cardiac and Pulmonary Rehab  Date  04/23/18  Educator  [facility]  Instruction Review Code  1- Verbalizes Understanding      Other: -Provides group and verbal instruction on various topics (see comments)   Knowledge Questionnaire Score: Knowledge Questionnaire Score - 02/07/18 1441      Knowledge Questionnaire Score   Pre Score  24/26   correct answers reviewed with Fara Olden      Core Components/Risk Factors/Patient Goals at Admission: Personal Goals and Risk Factors at Admission - 02/07/18 1435      Core Components/Risk Factors/Patient Goals on Admission   Hypertension  Yes    Intervention  Provide education on lifestyle modifcations including regular physical activity/exercise, weight management, moderate sodium restriction and increased consumption of fresh fruit, vegetables, and low fat dairy, alcohol moderation, and smoking cessation.;Monitor prescription use compliance.    Expected Outcomes   Short Term: Continued assessment and intervention until BP is < 140/64m HG in hypertensive participants. < 130/871mHG in hypertensive participants with diabetes, heart failure or chronic kidney disease.;Long Term: Maintenance of blood pressure at goal levels.    Lipids  Yes    Intervention  Provide education and support for participant on nutrition & aerobic/resistive exercise along with prescribed medications to achieve LDL <7097mHDL >75m51m  Expected Outcomes  Short Term: Participant states understanding of desired cholesterol values and is compliant with medications prescribed. Participant is following exercise prescription and nutrition guidelines.;Long Term: Cholesterol controlled with medications as prescribed, with individualized exercise RX and with personalized nutrition plan. Value goals: LDL < 70mg13mL > 40 mg.       Core Components/Risk Factors/Patient Goals Review:  Goals and Risk Factor Review    Row Name 03/14/18 1029 04/16/18 1012           Core Components/Risk Factors/Patient Goals Review   Personal Goals Review  Weight Management/Obesity;Hypertension;Lipids  Weight Management/Obesity;Lipids;Hypertension      Review  He has been maintaining his weight. He is concerned some over his blood pressures.  He has had a few dizzy spells.  He is not believing the numbers that he is getting at home on his cuffs.  He has noticed that it has been up and down more since being on the new meds.  He is feeling good overall and pleased with his progress.   Chaise Floraurrently maintaining body weight around 203 lb.    He still uses Glucerna daily for most of his meals.  He is taking meds as directed.  He has been to regular Dr visits and meds are the same.  [facility].  He feels some lack of energy - feels like it may be due to beta blocker.  He will speak with his Dr about this next appointment.  He  also feels he may have a thyroid issue.  He already has  been aware of watching sodium in foods and reads labels.          Expected Outcomes  Short: Talk to his doctor about his blood pressure concerns.  Long: Continue to maintain weight.   Short - talk to Dr at next appointment about BP meds and thyroid Long - maintain cureent weight and healthy habits          Core Components/Risk Factors/Patient Goals at Discharge (Final Review):  Goals and Risk Factor Review - 04/16/18 1012      Core Components/Risk Factors/Patient Goals Review   Personal Goals Review  Weight Management/Obesity;Lipids;Hypertension    Review  Keymarion is currently maintaining body weight around 203 lb.    He still uses Glucerna daily for most of his meals.  He is taking meds as directed.  He has been to regular Dr visits and meds are the same.  BP has been good at [facility] and overall Masiyah feels good.  He feels some lack of energy - feels like it may be due to beta blocker.  He will speak with his Dr about this next appointment.  He also feels he may have a thyroid issue.  He already has been aware of watching sodium in foods and reads labels.        Expected Outcomes  Short - talk to Dr at next appointment about BP meds and thyroid Long - maintain cureent weight and healthy habits        ITP Comments: ITP Comments    Row Name 02/07/18 1430 03/06/18 0558 04/03/18 0859 04/23/18 1114     ITP Comments  Med review completed. Initial ITP created. Diagnosis can be found in [facility] 12/10/17  30 day review. Continue with ITP unless directed changes per Medical Director review.  New to program  30 day review completed. ITP sent to Dr. Ramonita Lab, covering for Dr. Emily Filbert, Medical Director of Cardiac Rehab. Continue with ITP unless changes are made by physician  Discharge ITP sent and signed by Dr. Sabra Heck.  Discharge Summary routed to PCP and cardiologist.       Comments: Discharge ITP

## 2018-04-23 NOTE — Patient Instructions (Signed)
Discharge Patient Instructions  Patient Details  Name: Phillip [name] MRN: [id number] Date of Birth: 1941/06/23 Referring Provider:  [name], MD   Number of Visits: 37  Reason for Discharge:  Patient reached a stable level of exercise. Patient independent in their exercise. Patient has met program and personal goals.  Smoking History:  Social History   Tobacco Use  Smoking Status Never Smoker  Smokeless Tobacco Never Used    Diagnosis:  S/P CABG x 3  Initial Exercise Prescription: Initial Exercise Prescription - 02/07/18 1400      Date of Initial Exercise RX and Referring Provider   Date  02/07/18    Referring Provider  end      Treadmill   MPH  2.5    Grade  0.5    Minutes  15    METs  3.09      Recumbant Bike   Level  3    RPM  60    Watts  33    Minutes  15    METs  3.12      REL-XR   Level  3    Speed  50    Minutes  15    METs  3      Prescription Details   Frequency (times per week)  3    Duration  Progress to 30 minutes of continuous aerobic without signs/symptoms of physical distress      Intensity   THRR 40-80% of Max Heartrate  97-127    Ratings of Perceived Exertion  11-13    Perceived Dyspnea  0-4      Resistance Training   Training Prescription  Yes    Weight  4 lb    Reps  10-15       Discharge Exercise Prescription (Final Exercise Prescription Changes): Exercise Prescription Changes - 04/11/18 1100      Response to Exercise   Blood Pressure (Admit)  134/80    Blood Pressure (Exercise)  164/84    Blood Pressure (Exit)  126/72    Heart Rate (Admit)  60 bpm    Heart Rate (Exercise)  89 bpm    Heart Rate (Exit)  71 bpm    Perceived Dyspnea (Exercise)  13    Symptoms  none    Comments  increased to 5lb weights     Duration  Continue with 30 min of aerobic exercise without signs/symptoms of physical distress.    Intensity  THRR unchanged      Progression   Progression  Continue to progress workloads to maintain  intensity without signs/symptoms of physical distress.    Average METs  3.28      Resistance Training   Training Prescription  Yes    Weight  5 lb    Reps  10-15      Treadmill   MPH  3.2    Grade  0    Minutes  15    METs  3.45      Recumbant Bike   Level  7    Watts  33    Minutes  15    METs  3.12      REL-XR   Level  4    Minutes  15    METs  4.5      Home Exercise Plan   Plans to continue exercise at  Home (comment)    Frequency  Add 2 additional days to program exercise sessions.    Initial Home Exercises Provided  03/05/18       Functional Capacity: 6 Minute Walk    Row Name 02/07/18 1406 04/04/18 1015       6 Minute Walk   Phase  -  Discharge    Distance  1467 feet  1915 feet    Distance % Change  -  1.3 %    Distance Feet Change  -  448 ft    Walk Time  6 minutes  6 minutes    # of Rest Breaks  0  0    MPH  2.78  3.62    METS  3.12  3.9    RPE  11  13    Perceived Dyspnea   0  0    VO2 Peak  10.92  13.65    Symptoms  No  No    Resting HR  65 bpm  69 bpm    Resting BP  130/73  146/70    Resting Oxygen Saturation   95 %  95 %    Exercise Oxygen Saturation  during 6 min walk  96 %  96 %    Max Ex. HR  122 bpm  100 bpm    Max Ex. BP  138/74  164/82    2 Minute Post BP  118/64  152/78       Quality of Life: Quality of Life - 02/07/18 1440      Quality of Life Scores   Health/Function Pre  27.3 %    Socioeconomic Pre  27 %    Psych/Spiritual Pre  30 %    Family Pre  27.6 %    GLOBAL Pre  27.86 %       Personal Goals: Goals established at orientation with interventions provided to work toward goal. Personal Goals and Risk Factors at Admission - 02/07/18 1435      Core Components/Risk Factors/Patient Goals on Admission   Hypertension  Yes    Intervention  Provide education on lifestyle modifcations including regular physical activity/exercise, weight management, moderate sodium restriction and increased consumption of fresh fruit,  vegetables, and low fat dairy, alcohol moderation, and smoking cessation.;Monitor prescription use compliance.    Expected Outcomes  Short Term: Continued assessment and intervention until BP is < 140/32m HG in hypertensive participants. < 130/82mHG in hypertensive participants with diabetes, heart failure or chronic kidney disease.;Long Term: Maintenance of blood pressure at goal levels.    Lipids  Yes    Intervention  Provide education and support for participant on nutrition & aerobic/resistive exercise along with prescribed medications to achieve LDL <7048mHDL >88m70m  Expected Outcomes  Short Term: Participant states understanding of desired cholesterol values and is compliant with medications prescribed. Participant is following exercise prescription and nutrition guidelines.;Long Term: Cholesterol controlled with medications as prescribed, with individualized exercise RX and with personalized nutrition plan. Value goals: LDL < 70mg42mL > 40 mg.        Personal Goals Discharge: Goals and Risk Factor Review - 04/16/18 1012      Core Components/Risk Factors/Patient Goals Review   Personal Goals Review  Weight Management/Obesity;Lipids;Hypertension    Review  Kai Yassinurrently maintaining body weight around 203 lb.    He still uses Glucerna daily for most of his meals.  He is taking meds as directed.  He has been to regular Dr visits and meds are the same.  [facility].  He feels some  lack of energy - feels like it may be due to beta blocker.  He will speak with his Dr about this next appointment.  He also feels he may have a thyroid issue.  He already has been aware of watching sodium in foods and reads labels.        Expected Outcomes  Short - talk to Dr at next appointment about BP meds and thyroid Long - maintain cureent weight and healthy habits        Exercise Goals and Review: Exercise Goals    Row Name 02/07/18 1405 02/19/18 0924            Exercise Goals   Increase Physical Activity  Yes  Yes      Intervention  Provide advice, education, support and counseling about physical activity/exercise needs.;Develop an individualized exercise prescription for aerobic and resistive training based on initial evaluation findings, risk stratification, comorbidities and participant's personal goals.  Provide advice, education, support and counseling about physical activity/exercise needs.;Develop an individualized exercise prescription for aerobic and resistive training based on initial evaluation findings, risk stratification, comorbidities and participant's personal goals.      Expected Outcomes  Short Term: Attend rehab on a regular basis to increase amount of physical activity.;Long Term: Add in home exercise to make exercise part of routine and to increase amount of physical activity.;Long Term: Exercising regularly at least 3-5 days a week.  Short Term: Attend rehab on a regular basis to increase amount of physical activity.;Long Term: Add in home exercise to make exercise part of routine and to increase amount of physical activity.;Long Term: Exercising regularly at least 3-5 days a week.      Increase Strength and Stamina  Yes  Yes      Intervention  Provide advice, education, support and counseling about physical activity/exercise needs.;Develop an individualized exercise prescription for aerobic and resistive training based on initial evaluation findings, risk stratification, comorbidities and participant's personal goals.  Provide advice, education, support and counseling about physical activity/exercise needs.;Develop an individualized exercise prescription for aerobic and resistive training based on initial evaluation findings, risk stratification, comorbidities and participant's personal goals.      Expected Outcomes  Short Term: Increase workloads from initial exercise prescription for resistance, speed, and METs.;Short Term: Perform  resistance training exercises routinely during rehab and add in resistance training at home;Long Term: Improve cardiorespiratory fitness, muscular endurance and strength as measured by increased METs and functional capacity (6MWT)  Short Term: Increase workloads from initial exercise prescription for resistance, speed, and METs.;Short Term: Perform resistance training exercises routinely during rehab and add in resistance training at home;Long Term: Improve cardiorespiratory fitness, muscular endurance and strength as measured by increased METs and functional capacity (6MWT)      Able to understand and use rate of perceived exertion (RPE) scale  Yes  Yes      Intervention  Provide education and explanation on how to use RPE scale  Provide education and explanation on how to use RPE scale      Expected Outcomes  Short Term: Able to use RPE daily in rehab to express subjective intensity level;Long Term:  Able to use RPE to guide intensity level when exercising independently  Short Term: Able to use RPE daily in rehab to express subjective intensity level      Able to understand and use Dyspnea scale  Yes  Yes      Intervention  Provide education and explanation on how to use Dyspnea scale  Provide  education and explanation on how to use Dyspnea scale      Expected Outcomes  Short Term: Able to use Dyspnea scale daily in rehab to express subjective sense of shortness of breath during exertion;Long Term: Able to use Dyspnea scale to guide intensity level when exercising independently  Short Term: Able to use Dyspnea scale daily in rehab to express subjective sense of shortness of breath during exertion;Long Term: Able to use Dyspnea scale to guide intensity level when exercising independently      Knowledge and understanding of Target Heart Rate Range (THRR)  Yes  Yes      Intervention  Provide education and explanation of THRR including how the numbers were predicted and where they are located for reference   Provide education and explanation of THRR including how the numbers were predicted and where they are located for reference      Expected Outcomes  Short Term: Able to state/look up THRR;Short Term: Able to use daily as guideline for intensity in rehab;Long Term: Able to use THRR to govern intensity when exercising independently  Short Term: Able to state/look up THRR;Long Term: Able to use THRR to govern intensity when exercising independently;Short Term: Able to use daily as guideline for intensity in rehab      Able to check pulse independently  Yes  Yes      Intervention  Provide education and demonstration on how to check pulse in carotid and radial arteries.;Review the importance of being able to check your own pulse for safety during independent exercise  Provide education and demonstration on how to check pulse in carotid and radial arteries.;Review the importance of being able to check your own pulse for safety during independent exercise      Expected Outcomes  Short Term: Able to explain why pulse checking is important during independent exercise;Long Term: Able to check pulse independently and accurately  Short Term: Able to explain why pulse checking is important during independent exercise;Long Term: Able to check pulse independently and accurately      Understanding of Exercise Prescription  Yes  Yes      Intervention  Provide education, explanation, and written materials on patient's individual exercise prescription  Provide education, explanation, and written materials on patient's individual exercise prescription      Expected Outcomes  Short Term: Able to explain program exercise prescription;Long Term: Able to explain home exercise prescription to exercise independently  Short Term: Able to explain program exercise prescription;Long Term: Able to explain home exercise prescription to exercise independently         Nutrition & Weight - Outcomes: Pre Biometrics - 02/07/18 1405       Pre Biometrics   Height  '6\' 1"'  (1.854 m)    Weight  203 lb 3.2 oz (92.2 kg)    Waist Circumference  39 inches    Hip Circumference  43 inches    Waist to Hip Ratio  0.91 %    BMI (Calculated)  26.81    Single Leg Stand  2.78 seconds        Nutrition: Nutrition Therapy & Goals - 03/07/18 1052      Nutrition Therapy   Diet  TLC    Protein (specify units)  12-13oz    Fiber  35 grams   per pt, typically consumes at least 40g/ day   Saturated Fats  16 max. grams    Fruits and Vegetables  5 servings/day   8 ideal, cosumes mostly nutritional supplements  Sodium  2000 grams      Personal Nutrition Goals   Nutrition Goal  Aim to maintain CBW +/- 5#. If you notice your weight decreasing, add 1-2 more Glucerna nutritional supplements per day, and/or consume an extra small meal daily    Personal Goal #2  Look for lower sodium versions of items like soup, canned beans, and hot dogs when possible    Comments  D/t h/o tonsil cancer resulting in alterations to taste and saliva production, he lives off of Glucerna supplements mainly, consuming at least 12 cans/day with small amounts of soft and savory foods. To a certain extent he monitors fiber, sodium and cholesterol.      Intervention Plan   Intervention  Prescribe, educate and counsel regarding individualized specific dietary modifications aiming towards targeted core components such as weight, hypertension, lipid management, diabetes, heart failure and other comorbidities.    Expected Outcomes  Short Term Goal: A plan has been developed with personal nutrition goals set during dietitian appointment.;Long Term Goal: Adherence to prescribed nutrition plan.;Short Term Goal: Understand basic principles of dietary content, such as calories, fat, sodium, cholesterol and nutrients.       Nutrition Discharge: Nutrition Assessments - 02/07/18 1437      MEDFICTS Scores   Pre Score  27       Education Questionnaire Score: Knowledge  Questionnaire Score - 02/07/18 1441      Knowledge Questionnaire Score   Pre Score  24/26   correct answers reviewed with Fara Olden      Goals reviewed with patient; copy given to patient.

## 2018-04-23 NOTE — Progress Notes (Signed)
Daily Session Note  Patient Details  Name: Phillip [name] MRN: [id number] Date of Birth: June 08, 1941 Referring Provider:     Cardiac Rehab from 02/07/2018 in [facility]  Referring Provider  end      Encounter Date: 04/23/2018  Check In: Session Check In - 04/23/18 0915      Check-In   Supervising physician immediately available to respond to emergencies  See telemetry face sheet for immediately available ER MD    Location  [facility]    Staff Present  Nyoka Cowden, RN, BSN, Willette Pa, MA, RCEP, CCRP, Exercise Physiologist;Amanda Oletta Darter, [place], ACSM CEP, Exercise Physiologist    Medication changes reported      No    Fall or balance concerns reported     No    Warm-up and Cool-down  Performed on first and last piece of equipment    Resistance Training Performed  Yes    VAD Patient?  No    PAD/SET Patient?  No      Pain Assessment   Currently in Pain?  No/denies          Social History   Tobacco Use  Smoking Status Never Smoker  Smokeless Tobacco Never Used    Goals Met:  Independence with exercise equipment Exercise tolerated well No report of cardiac concerns or symptoms Strength training completed today  Goals Unmet:  Not Applicable  Comments: Pt able to follow exercise prescription today without complaint.  Will continue to monitor for progression.  Phillip [name] graduated today from  rehab with 36 sessions completed.  Details of the patient's exercise prescription and what He needs to do in order to continue the prescription and progress were discussed with patient.  Patient was given a copy of prescription and goals.  Patient verbalized understanding.  [name] plans to continue to exercise by walking at home.    Dr. Emily Filbert is Medical Director for [place] [facility].

## 2018-04-23 NOTE — Progress Notes (Signed)
Discharge Progress Report  Patient Details  Name: Phillip [name] MRN: [id number] Date of Birth: 1941-07-07 Referring Provider:     Cardiac Rehab from 02/07/2018 in [facility]  Referring Provider  end       Number of Visits: 36/36  Reason for Discharge:  Patient reached a stable level of exercise. Patient independent in their exercise. Patient has met program and personal goals.  Smoking History:  Social History   Tobacco Use  Smoking Status Never Smoker  Smokeless Tobacco Never Used    Diagnosis:  S/P CABG x 3  ADL UCSD:  Initial Exercise Prescription: Initial Exercise Prescription - 02/07/18 1400      Date of Initial Exercise RX and Referring Provider   Date  02/07/18    Referring Provider  end      Treadmill   MPH  2.5    Grade  0.5    Minutes  15    METs  3.09      Recumbant Bike   Level  3    RPM  60    Watts  33    Minutes  15    METs  3.12      REL-XR   Level  3    Speed  50    Minutes  15    METs  3      Prescription Details   Frequency (times per week)  3    Duration  Progress to 30 minutes of continuous aerobic without signs/symptoms of physical distress      Intensity   THRR 40-80% of Max Heartrate  97-127    Ratings of Perceived Exertion  11-13    Perceived Dyspnea  0-4      Resistance Training   Training Prescription  Yes    Weight  4 lb    Reps  10-15       Discharge Exercise Prescription (Final Exercise Prescription Changes): Exercise Prescription Changes - 04/11/18 1100      Response to Exercise   Blood Pressure (Admit)  134/80    Blood Pressure (Exercise)  164/84    Blood Pressure (Exit)  126/72    Heart Rate (Admit)  60 bpm    Heart Rate (Exercise)  89 bpm    Heart Rate (Exit)  71 bpm    Perceived Dyspnea (Exercise)  13    Symptoms  none    Comments  increased to 5lb weights     Duration  Continue with 30 min of aerobic exercise without signs/symptoms of physical distress.    Intensity  THRR  unchanged      Progression   Progression  Continue to progress workloads to maintain intensity without signs/symptoms of physical distress.    Average METs  3.28      Resistance Training   Training Prescription  Yes    Weight  5 lb    Reps  10-15      Treadmill   MPH  3.2    Grade  0    Minutes  15    METs  3.45      Recumbant Bike   Level  7    Watts  33    Minutes  15    METs  3.12      REL-XR   Level  4    Minutes  15    METs  4.5      Home Exercise Plan   Plans to continue exercise at  Home (comment)    Frequency  Add 2 additional days to program exercise sessions.    Initial Home Exercises Provided  03/05/18       Functional Capacity: 6 Minute Walk    Row Name 02/07/18 1406 04/04/18 1015       6 Minute Walk   Phase  -  Discharge    Distance  1467 feet  1915 feet    Distance % Change  -  1.3 %    Distance Feet Change  -  448 ft    Walk Time  6 minutes  6 minutes    # of Rest Breaks  0  0    MPH  2.78  3.62    METS  3.12  3.9    RPE  11  13    Perceived Dyspnea   0  0    VO2 Peak  10.92  13.65    Symptoms  No  No    Resting HR  65 bpm  69 bpm    Resting BP  130/73  146/70    Resting Oxygen Saturation   95 %  95 %    Exercise Oxygen Saturation  during 6 min walk  96 %  96 %    Max Ex. HR  122 bpm  100 bpm    Max Ex. BP  138/74  164/82    2 Minute Post BP  118/64  152/78       Psychological, QOL, Others - Outcomes: PHQ 2/9: Depression screen [facility] 2/9 02/07/2018 11/07/2017 12/18/2016 05/29/2016 10/21/2015  Decreased Interest 0 0 - 0 0  Down, Depressed, Hopeless 0 0 0 0 0  PHQ - 2 Score 0 0 0 0 0  Altered sleeping 0 0 - - -  Tired, decreased energy 1 0 - - -  Change in appetite 3 0 - - -  Feeling bad or failure about yourself  0 0 - - -  Trouble concentrating 0 0 - - -  Moving slowly or fidgety/restless 0 0 - - -  Suicidal thoughts 0 0 - - -  PHQ-9 Score 4 0 - - -  Difficult doing work/chores Not difficult at all Not difficult at all - - -     Quality of Life: Quality of Life - 02/07/18 1440      Quality of Life Scores   Health/Function Pre  27.3 %    Socioeconomic Pre  27 %    Psych/Spiritual Pre  30 %    Family Pre  27.6 %    GLOBAL Pre  27.86 %       Personal Goals: Goals established at orientation with interventions provided to work toward goal. Personal Goals and Risk Factors at Admission - 02/07/18 1435      Core Components/Risk Factors/Patient Goals on Admission   Hypertension  Yes    Intervention  Provide education on lifestyle modifcations including regular physical activity/exercise, weight management, moderate sodium restriction and increased consumption of fresh fruit, vegetables, and low fat dairy, alcohol moderation, and smoking cessation.;Monitor prescription use compliance.    Expected Outcomes  Short Term: Continued assessment and intervention until BP is < 140/62m HG in hypertensive participants. < 130/851mHG in hypertensive participants with diabetes, heart failure or chronic kidney disease.;Long Term: Maintenance of blood pressure at goal levels.    Lipids  Yes    Intervention  Provide education and support for participant on nutrition & aerobic/resistive exercise along with prescribed medications to achieve  LDL <88m, HDL >410m    Expected Outcomes  Short Term: Participant states understanding of desired cholesterol values and is compliant with medications prescribed. Participant is following exercise prescription and nutrition guidelines.;Long Term: Cholesterol controlled with medications as prescribed, with individualized exercise RX and with personalized nutrition plan. Value goals: LDL < 7030mHDL > 40 mg.        Personal Goals Discharge: Goals and Risk Factor Review    Row Name 03/14/18 1029 04/16/18 1012           Core Components/Risk Factors/Patient Goals Review   Personal Goals Review  Weight Management/Obesity;Hypertension;Lipids  Weight Management/Obesity;Lipids;Hypertension       Review  He has been maintaining his weight. He is concerned some over his blood pressures.  He has had a few dizzy spells.  He is not believing the numbers that he is getting at home on his cuffs.  He has noticed that it has been up and down more since being on the new meds.  He is feeling good overall and pleased with his progress.   JoeNeilan currently maintaining body weight around 203 lb.    He still uses Glucerna daily for most of his meals.  He is taking meds as directed.  He has been to regular Dr visits and meds are the same.  [facility].  He feels some lack of energy - feels like it may be due to beta blocker.  He will speak with his Dr about this next appointment.  He also feels he may have a thyroid issue.  He already has been aware of watching sodium in foods and reads labels.          Expected Outcomes  Short: Talk to his doctor about his blood pressure concerns.  Long: Continue to maintain weight.   Short - talk to Dr at next appointment about BP meds and thyroid Long - maintain cureent weight and healthy habits          Exercise Goals and Review: Exercise Goals    Row Name 02/07/18 1405 02/19/18 0924           Exercise Goals   Increase Physical Activity  Yes  Yes      Intervention  Provide advice, education, support and counseling about physical activity/exercise needs.;Develop an individualized exercise prescription for aerobic and resistive training based on initial evaluation findings, risk stratification, comorbidities and participant's personal goals.  Provide advice, education, support and counseling about physical activity/exercise needs.;Develop an individualized exercise prescription for aerobic and resistive training based on initial evaluation findings, risk stratification, comorbidities and participant's personal goals.      Expected Outcomes  Short Term: Attend rehab on a regular basis to increase amount of physical activity.;Long Term:  Add in home exercise to make exercise part of routine and to increase amount of physical activity.;Long Term: Exercising regularly at least 3-5 days a week.  Short Term: Attend rehab on a regular basis to increase amount of physical activity.;Long Term: Add in home exercise to make exercise part of routine and to increase amount of physical activity.;Long Term: Exercising regularly at least 3-5 days a week.      Increase Strength and Stamina  Yes  Yes      Intervention  Provide advice, education, support and counseling about physical activity/exercise needs.;Develop an individualized exercise prescription for aerobic and resistive training based on initial evaluation findings, risk stratification, comorbidities and participant's personal goals.  Provide advice, education, support and counseling about physical activity/exercise needs.;Develop an individualized exercise prescription for aerobic and resistive training based on initial evaluation findings, risk stratification, comorbidities and participant's personal goals.      Expected Outcomes  Short Term: Increase workloads from initial exercise prescription for resistance, speed, and METs.;Short Term: Perform resistance training exercises routinely during rehab and add in resistance training at home;Long Term: Improve cardiorespiratory fitness, muscular endurance and strength as measured by increased METs and functional capacity (6MWT)  Short Term: Increase workloads from initial exercise prescription for resistance, speed, and METs.;Short Term: Perform resistance training exercises routinely during rehab and add in resistance training at home;Long Term: Improve cardiorespiratory fitness, muscular endurance and strength as measured by increased METs and functional capacity (6MWT)      Able to understand and use rate of perceived exertion (RPE) scale  Yes  Yes      Intervention  Provide education and explanation on how to use RPE scale  Provide education and  explanation on how to use RPE scale      Expected Outcomes  Short Term: Able to use RPE daily in rehab to express subjective intensity level;Long Term:  Able to use RPE to guide intensity level when exercising independently  Short Term: Able to use RPE daily in rehab to express subjective intensity level      Able to understand and use Dyspnea scale  Yes  Yes      Intervention  Provide education and explanation on how to use Dyspnea scale  Provide education and explanation on how to use Dyspnea scale      Expected Outcomes  Short Term: Able to use Dyspnea scale daily in rehab to express subjective sense of shortness of breath during exertion;Long Term: Able to use Dyspnea scale to guide intensity level when exercising independently  Short Term: Able to use Dyspnea scale daily in rehab to express subjective sense of shortness of breath during exertion;Long Term: Able to use Dyspnea scale to guide intensity level when exercising independently      Knowledge and understanding of Target Heart Rate Range (THRR)  Yes  Yes      Intervention  Provide education and explanation of THRR including how the numbers were predicted and where they are located for reference  Provide education and explanation of THRR including how the numbers were predicted and where they are located for reference      Expected Outcomes  Short Term: Able to state/look up THRR;Short Term: Able to use daily as guideline for intensity in rehab;Long Term: Able to use THRR to govern intensity when exercising independently  Short Term: Able to state/look up THRR;Long Term: Able to use THRR to govern intensity when exercising independently;Short Term: Able to use daily as guideline for intensity in rehab      Able to check pulse independently  Yes  Yes      Intervention  Provide education and demonstration on how to check pulse in carotid and radial arteries.;Review the importance of being able to check your own pulse for safety during independent  exercise  Provide education and demonstration on how to check pulse in carotid and radial arteries.;Review the importance of being able to check your own pulse for safety during independent exercise      Expected Outcomes  Short Term: Able to explain why pulse checking is important during independent exercise;Long Term: Able to check pulse independently and accurately  Short Term: Able to explain why pulse checking is important  during independent exercise;Long Term: Able to check pulse independently and accurately      Understanding of Exercise Prescription  Yes  Yes      Intervention  Provide education, explanation, and written materials on patient's individual exercise prescription  Provide education, explanation, and written materials on patient's individual exercise prescription      Expected Outcomes  Short Term: Able to explain program exercise prescription;Long Term: Able to explain home exercise prescription to exercise independently  Short Term: Able to explain program exercise prescription;Long Term: Able to explain home exercise prescription to exercise independently         Nutrition & Weight - Outcomes: Pre Biometrics - 02/07/18 1405      Pre Biometrics   Height  '6\' 1"'  (1.854 m)    Weight  203 lb 3.2 oz (92.2 kg)    Waist Circumference  39 inches    Hip Circumference  43 inches    Waist to Hip Ratio  0.91 %    BMI (Calculated)  26.81    Single Leg Stand  2.78 seconds        Nutrition: Nutrition Therapy & Goals - 03/07/18 1052      Nutrition Therapy   Diet  TLC    Protein (specify units)  12-13oz    Fiber  35 grams   per pt, typically consumes at least 40g/ day   Saturated Fats  16 max. grams    Fruits and Vegetables  5 servings/day   8 ideal, cosumes mostly nutritional supplements   Sodium  2000 grams      Personal Nutrition Goals   Nutrition Goal  Aim to maintain CBW +/- 5#. If you notice your weight decreasing, add 1-2 more Glucerna nutritional supplements per day,  and/or consume an extra small meal daily    Personal Goal #2  Look for lower sodium versions of items like soup, canned beans, and hot dogs when possible    Comments  D/t h/o tonsil cancer resulting in alterations to taste and saliva production, he lives off of Glucerna supplements mainly, consuming at least 12 cans/day with small amounts of soft and savory foods. To a certain extent he monitors fiber, sodium and cholesterol.      Intervention Plan   Intervention  Prescribe, educate and counsel regarding individualized specific dietary modifications aiming towards targeted core components such as weight, hypertension, lipid management, diabetes, heart failure and other comorbidities.    Expected Outcomes  Short Term Goal: A plan has been developed with personal nutrition goals set during dietitian appointment.;Long Term Goal: Adherence to prescribed nutrition plan.;Short Term Goal: Understand basic principles of dietary content, such as calories, fat, sodium, cholesterol and nutrients.       Nutrition Discharge: Nutrition Assessments - 02/07/18 1437      MEDFICTS Scores   Pre Score  27       Education Questionnaire Score: Knowledge Questionnaire Score - 02/07/18 1441      Knowledge Questionnaire Score   Pre Score  24/26   correct answers reviewed with Fara Olden      Goals reviewed with patient; copy given to patient.

## 2018-04-25 ENCOUNTER — Telehealth: Payer: Self-pay | Admitting: *Deleted

## 2018-04-25 DIAGNOSIS — I483 Typical atrial flutter: Secondary | ICD-10-CM | POA: Diagnosis not present

## 2018-04-25 NOTE — Telephone Encounter (Signed)
Received request from [facility] Benefits Specialist, Liana Crocker, to fax clinical note, H&P, hospital notes, as well as any cardiac testing to [phone]. This is for a Utilization Management requirement for [employer] service. Those items faxed to number requested.

## 2018-05-12 ENCOUNTER — Other Ambulatory Visit: Payer: Self-pay | Admitting: Family Medicine

## 2018-05-12 DIAGNOSIS — R946 Abnormal results of thyroid function studies: Secondary | ICD-10-CM

## 2018-05-12 DIAGNOSIS — D696 Thrombocytopenia, unspecified: Secondary | ICD-10-CM

## 2018-05-12 DIAGNOSIS — E039 Hypothyroidism, unspecified: Secondary | ICD-10-CM

## 2018-05-13 ENCOUNTER — Other Ambulatory Visit (INDEPENDENT_AMBULATORY_CARE_PROVIDER_SITE_OTHER): Payer: PPO

## 2018-05-13 DIAGNOSIS — R946 Abnormal results of thyroid function studies: Secondary | ICD-10-CM

## 2018-05-13 DIAGNOSIS — E039 Hypothyroidism, unspecified: Secondary | ICD-10-CM

## 2018-05-13 DIAGNOSIS — D696 Thrombocytopenia, unspecified: Secondary | ICD-10-CM | POA: Diagnosis not present

## 2018-05-13 LAB — CBC WITH DIFFERENTIAL/PLATELET
BASOS ABS: 0 10*3/uL (ref 0.0–0.1)
Basophils Relative: 0.2 % (ref 0.0–3.0)
EOS PCT: 3.1 % (ref 0.0–5.0)
Eosinophils Absolute: 0.1 10*3/uL (ref 0.0–0.7)
HEMATOCRIT: 49.3 % (ref 39.0–52.0)
Hemoglobin: 16.9 g/dL (ref 13.0–17.0)
LYMPHS ABS: 0.5 10*3/uL — AB (ref 0.7–4.0)
Lymphocytes Relative: 13.2 % (ref 12.0–46.0)
MCHC: 34.2 g/dL (ref 30.0–36.0)
MCV: 91.4 fl (ref 78.0–100.0)
MONOS PCT: 13 % — AB (ref 3.0–12.0)
Monocytes Absolute: 0.5 10*3/uL (ref 0.1–1.0)
NEUTROS PCT: 70.5 % (ref 43.0–77.0)
Neutro Abs: 2.7 10*3/uL (ref 1.4–7.7)
PLATELETS: 111 10*3/uL — AB (ref 150.0–400.0)
RBC: 5.39 Mil/uL (ref 4.22–5.81)
RDW: 17.4 % — AB (ref 11.5–15.5)
WBC: 3.8 10*3/uL — ABNORMAL LOW (ref 4.0–10.5)

## 2018-05-13 LAB — BASIC METABOLIC PANEL
BUN: 20 mg/dL (ref 6–23)
CO2: 30 mEq/L (ref 19–32)
CREATININE: 0.79 mg/dL (ref 0.40–1.50)
Calcium: 9.8 mg/dL (ref 8.4–10.5)
Chloride: 106 mEq/L (ref 96–112)
GFR: 100.65 mL/min (ref >60.00)
GLUCOSE: 130 mg/dL — AB (ref 70–99)
Potassium: 5.4 mEq/L — ABNORMAL HIGH (ref 3.5–5.1)
Sodium: 141 mEq/L (ref 135–145)

## 2018-05-13 LAB — T4, FREE: Free T4: 0.88 ng/dL (ref 0.60–1.60)

## 2018-05-13 LAB — TSH: TSH: 5.42 u[IU]/mL — ABNORMAL HIGH (ref 0.35–4.50)

## 2018-05-20 ENCOUNTER — Ambulatory Visit (INDEPENDENT_AMBULATORY_CARE_PROVIDER_SITE_OTHER): Payer: PPO | Admitting: Family Medicine

## 2018-05-20 ENCOUNTER — Encounter: Payer: Self-pay | Admitting: Family Medicine

## 2018-05-20 VITALS — BP 136/82 | HR 60 | Temp 98.2°F | Ht 72.5 in | Wt 207.5 lb

## 2018-05-20 DIAGNOSIS — I5022 Chronic systolic (congestive) heart failure: Secondary | ICD-10-CM

## 2018-05-20 DIAGNOSIS — F5101 Primary insomnia: Secondary | ICD-10-CM | POA: Diagnosis not present

## 2018-05-20 DIAGNOSIS — I255 Ischemic cardiomyopathy: Secondary | ICD-10-CM

## 2018-05-20 DIAGNOSIS — R351 Nocturia: Secondary | ICD-10-CM

## 2018-05-20 DIAGNOSIS — R7303 Prediabetes: Secondary | ICD-10-CM

## 2018-05-20 DIAGNOSIS — E785 Hyperlipidemia, unspecified: Secondary | ICD-10-CM | POA: Diagnosis not present

## 2018-05-20 DIAGNOSIS — Z23 Encounter for immunization: Secondary | ICD-10-CM | POA: Diagnosis not present

## 2018-05-20 DIAGNOSIS — E875 Hyperkalemia: Secondary | ICD-10-CM

## 2018-05-20 DIAGNOSIS — R946 Abnormal results of thyroid function studies: Secondary | ICD-10-CM

## 2018-05-20 DIAGNOSIS — N3281 Overactive bladder: Secondary | ICD-10-CM

## 2018-05-20 DIAGNOSIS — R011 Cardiac murmur, unspecified: Secondary | ICD-10-CM

## 2018-05-20 DIAGNOSIS — I2510 Atherosclerotic heart disease of native coronary artery without angina pectoris: Secondary | ICD-10-CM

## 2018-05-20 DIAGNOSIS — E039 Hypothyroidism, unspecified: Secondary | ICD-10-CM

## 2018-05-20 DIAGNOSIS — N401 Enlarged prostate with lower urinary tract symptoms: Secondary | ICD-10-CM | POA: Diagnosis not present

## 2018-05-20 DIAGNOSIS — D696 Thrombocytopenia, unspecified: Secondary | ICD-10-CM

## 2018-05-20 DIAGNOSIS — Z951 Presence of aortocoronary bypass graft: Secondary | ICD-10-CM | POA: Diagnosis not present

## 2018-05-20 MED ORDER — ZALEPLON 10 MG PO CAPS: 10.0000 mg | ORAL_CAPSULE | Freq: Every evening | ORAL | 0 refills | Status: DC | PRN

## 2018-05-20 NOTE — Assessment & Plan Note (Signed)
crestor started 10/2017 - latest LDL 49 (02/2018). Continue statin after CABG.

## 2018-05-20 NOTE — Patient Instructions (Addendum)
Flu shot today Stop potassium Return in 3 months for lab visit only - recheck sugars and platelets.  Sonata refilled today.

## 2018-05-20 NOTE — Assessment & Plan Note (Addendum)
Has stopped OTC potassium (was taking BID).  Continue lasix. Will reassess next labs.

## 2018-05-20 NOTE — Assessment & Plan Note (Signed)
Recently worse. He is taking aspirin daily. I asked him to return in 3 months for lab visit to recheck plt.

## 2018-05-20 NOTE — Progress Notes (Signed)
BP 136/82 (BP Location: Left Arm, Patient Position: Sitting, Cuff Size: Normal)   Pulse 60   Temp 98.2 F (36.8 C) (Oral)   Ht 6' 0.5" (1.842 m)   Wt 207 lb 8 oz (94.1 kg)   SpO2 99%   BMI 27.76 kg/m    CC: 6 mo f/u visit Subjective:    Patient ID: Phillip [name], male    DOB: 1940/10/16, 77 y.o.   MRN: [id number]  HPI: Phillip [name] is a 77 y.o. male presenting on 05/20/2018 for 6 mo follow up (Pt accompanied by his wife. )   Complicated last few months. CAD s/p CABG 11/2017, found to have chronic systolic CHF 2/2 ischemic CM, atrial fibrillation/flutter s/p successful cardioversion 01/2018. Sees cardiology Dr Saunders Revel. Elwyn Reach monitor completed last month (done for dizzy episodes). Completed cardiac rehab. Was on aspirin/apixiban - eliquis was discontinued due to easily bleeding gums.   Completed cardiac rehab.  Recent labs showed hyperkalemia - he stopped OTC potassium. He takes lasix 20mg  daily.   Denies hypothyroid symptoms. Some fatigue attributed to beta blocker. No unexpected weight gain.   Would like referral to urology for ongoing nocturia - has not been taking myrbetriq. Ongoing nocturia Q1.5 hours. Takes sonata sparingly. Requests sonata refill.   Relevant past medical, surgical, family and social history reviewed and updated as indicated. Interim medical history since our last visit reviewed. Allergies and medications reviewed and updated. Outpatient Medications Prior to Visit  Medication Sig Dispense Refill  . Ascorbic Acid (VITAMIN C PO) Take by mouth daily.    Marland Kitchen aspirin EC 81 MG tablet Take 1 tablet (81 mg total) by mouth daily. 90 tablet 3  . carvedilol (COREG) 6.25 MG tablet Take 1 tablet (6.25 mg) by mouth twice daily    . Docusate Sodium (STOOL SOFTENER) 100 MG capsule Take 350 mg by mouth daily.     Marland Kitchen MAGNESIUM PO Take 1 tablet by mouth daily.    . mirabegron ER (MYRBETRIQ) 25 MG TB24 tablet Take 25 mg by mouth daily.    . Multiple Vitamin (MULTIVITAMIN) tablet Take 1  tablet by mouth daily.    . nitroGLYCERIN (NITROSTAT) 0.4 MG SL tablet Place 1 tablet (0.4 mg total) under the tongue every 5 (five) minutes as needed for chest pain. 30 tablet prn  . ondansetron (ZOFRAN ODT) 4 MG disintegrating tablet Take 1 tablet (4 mg total) by mouth every 8 (eight) hours as needed for nausea or vomiting. 20 tablet 0  . rosuvastatin (CRESTOR) 20 MG tablet Take 1 tablet (20 mg total) by mouth daily. 90 tablet 3  . POTASSIUM GLUCONATE PO Take 198 mg by mouth daily.     . zaleplon (SONATA) 10 MG capsule Take 1 capsule (10 mg total) by mouth at bedtime as needed for sleep. 30 capsule 0  . furosemide (LASIX) 20 MG tablet Take 1 tablet (20 mg total) by mouth daily. 90 tablet 3  . apixaban (ELIQUIS) 5 MG TABS tablet Take 1 tablet (5 mg total) by mouth 2 (two) times daily. 60 tablet 6   No facility-administered medications prior to visit.      Per HPI unless specifically indicated in ROS section below Review of Systems     Objective:    BP 136/82 (BP Location: Left Arm, Patient Position: Sitting, Cuff Size: Normal)   Pulse 60   Temp 98.2 F (36.8 C) (Oral)   Ht 6' 0.5" (1.842 m)   Wt 207 lb 8 oz (94.1 kg)   SpO2  99%   BMI 27.76 kg/m   Wt Readings from Last 3 Encounters:  05/20/18 207 lb 8 oz (94.1 kg)  03/20/18 202 lb (91.6 kg)  02/18/18 206 lb (93.4 kg)    Physical Exam  Constitutional: He appears well-developed and well-nourished. No distress.  HENT:  Head: Normocephalic and atraumatic.  Mouth/Throat: Oropharynx is clear and moist. No oropharyngeal exudate.  Eyes: Pupils are equal, round, and reactive to light. EOM are normal.  Neck:  Woody consistency to neck after radiation therapy  Cardiovascular: Normal rate, regular rhythm and normal heart sounds.  No murmur heard. Pulmonary/Chest: Effort normal and breath sounds normal. No respiratory distress. He has no wheezes. He has no rales.  Musculoskeletal: He exhibits edema (tr).  Psychiatric: He has a normal  mood and affect.  Nursing note and vitals reviewed.  Results for orders placed or performed in visit on 05/13/18  CBC with Differential/Platelet  Result Value Ref Range   WBC 3.8 (L) 4.0 - 10.5 K/uL   RBC 5.39 4.22 - 5.81 Mil/uL   Hemoglobin 16.9 13.0 - 17.0 g/dL   HCT 49.3 39.0 - 52.0 %   MCV 91.4 78.0 - 100.0 fl   MCHC 34.2 30.0 - 36.0 g/dL   RDW 17.4 (H) 11.5 - 15.5 %   Platelets 111.0 (L) 150.0 - 400.0 K/uL   Neutrophils Relative % 70.5 43.0 - 77.0 %   Lymphocytes Relative 13.2 12.0 - 46.0 %   Monocytes Relative 13.0 (H) 3.0 - 12.0 %   Eosinophils Relative 3.1 0.0 - 5.0 %   Basophils Relative 0.2 0.0 - 3.0 %   Neutro Abs 2.7 1.4 - 7.7 K/uL   Lymphs Abs 0.5 (L) 0.7 - 4.0 K/uL   Monocytes Absolute 0.5 0.1 - 1.0 K/uL   Eosinophils Absolute 0.1 0.0 - 0.7 K/uL   Basophils Absolute 0.0 0.0 - 0.1 K/uL  T4, free  Result Value Ref Range   Free T4 0.88 0.60 - 1.60 ng/dL  Basic metabolic panel  Result Value Ref Range   Sodium 141 135 - 145 mEq/L   Potassium 5.4 (H) 3.5 - 5.1 mEq/L   Chloride 106 96 - 112 mEq/L   CO2 30 19 - 32 mEq/L   Glucose, Bld 130 (H) 70 - 99 mg/dL   BUN 20 6 - 23 mg/dL   Creatinine, Ser 0.79 0.40 - 1.50 mg/dL   Calcium 9.8 8.4 - 10.5 mg/dL   GFR 100.65 >60.00 mL/min  TSH  Result Value Ref Range   TSH 5.42 (H) 0.35 - 4.50 uIU/mL   Lab Results  Component Value Date   CHOL 119 03/20/2018   HDL 37 (L) 03/20/2018   LDLCALC 49 03/20/2018   LDLDIRECT 101.0 10/13/2014   TRIG 163 (H) 03/20/2018   CHOLHDL 3.2 03/20/2018      Assessment & Plan:   Problem List Items Addressed This Visit    Thrombocytopenia (Interior) - Primary    Recently worse. He is taking aspirin daily. I asked him to return in 3 months for lab visit to recheck plt.       Relevant Orders   CBC with Differential/Platelet   Pathologist smear review   Vitamin B12   Folate   Systolic murmur    Not appreciated today      S/P CABG x 3    Appreciate cards and thoracic surgery care.        Prediabetes    He regularly drinks glucerna.  Will update A1c at f/u lab  visit.       Relevant Orders   Hemoglobin A1c   Ischemic cardiomyopathy    Continue statin, aspirin, lasix and carvedilol.       Insomnia    Chronic, infrequent sonata use - refilled today (last filled 12/2017)      Relevant Medications   zaleplon (SONATA) 10 MG capsule   Hyperlipidemia LDL goal <70    crestor started 10/2017 - latest LDL 49 (02/2018). Continue statin after CABG.      Hyperkalemia    Has stopped OTC potassium (was taking BID).  Continue lasix. Will reassess next labs.       Relevant Orders   Basic metabolic panel   Detrusor instability of bladder    Prior saw Dr Louis Meckel on [address].  Requests referral to urology to re establish care - locally in [place].      Relevant Orders   Ambulatory referral to Urology   Coronary artery disease involving native coronary artery of native heart without angina pectoris    S/p 3v CABG 11/2017, has recovered well, and completed cardiac rehab. Upcoming f/u with cards.       Chronic systolic heart failure (HCC)    Continue lasix 20mg  daily. Stop OTC potassium supplement given hyperkalemia.       BPH associated with nocturia    Refer back to urology. Nocturia exacerbated by predominantly liquid diet.       Borderline hypothyroidism    TSH again mildly elevated, free T4 normal. Declines significant hypothyroid symptoms - will continue to monitor.       Relevant Orders   TSH   T4, free    Other Visit Diagnoses    Need for influenza vaccination       Relevant Orders   Flu Vaccine QUAD 36+ mos IM (Completed)       Meds ordered this encounter  Medications  . zaleplon (SONATA) 10 MG capsule    Sig: Take 1 capsule (10 mg total) by mouth at bedtime as needed for sleep.    Dispense:  30 capsule    Refill:  0   Orders Placed This Encounter  Procedures  . Flu Vaccine QUAD 36+ mos IM  . TSH    Standing Status:   Future    Standing  Expiration Date:   05/21/2019  . T4, free    Standing Status:   Future    Standing Expiration Date:   05/21/2019  . CBC with Differential/Platelet    Standing Status:   Future    Standing Expiration Date:   05/21/2019  . Pathologist smear review    Standing Status:   Future    Standing Expiration Date:   05/21/2019  . Basic metabolic panel    Standing Status:   Future    Standing Expiration Date:   05/21/2019  . Hemoglobin A1c    Standing Status:   Future    Standing Expiration Date:   05/21/2019  . Vitamin B12    Standing Status:   Future    Standing Expiration Date:   05/21/2019  . Folate    Standing Status:   Future    Standing Expiration Date:   05/21/2019  . Ambulatory referral to Urology    Referral Priority:   Routine    Referral Type:   Consultation    Referral Reason:   Specialty Services Required    Requested Specialty:   Urology    Number of Visits Requested:   1    Follow  up plan: Return in about 6 months (around 11/18/2018) for annual exam, prior fasting for blood work, medicare wellness visit.  [name], MD

## 2018-05-20 NOTE — Assessment & Plan Note (Addendum)
TSH again mildly elevated, free T4 normal. Declines significant hypothyroid symptoms - will continue to monitor.

## 2018-05-20 NOTE — Assessment & Plan Note (Addendum)
Refer back to urology. Nocturia exacerbated by predominantly liquid diet.

## 2018-05-20 NOTE — Assessment & Plan Note (Signed)
S/p 3v CABG 11/2017, has recovered well, and completed cardiac rehab. Upcoming f/u with cards.

## 2018-05-20 NOTE — Assessment & Plan Note (Addendum)
Continue statin, aspirin, lasix and carvedilol.

## 2018-05-20 NOTE — Assessment & Plan Note (Signed)
Chronic, infrequent sonata use - refilled today (last filled 12/2017)

## 2018-05-20 NOTE — Assessment & Plan Note (Signed)
Not appreciated today.  

## 2018-05-20 NOTE — Assessment & Plan Note (Addendum)
He regularly drinks glucerna.  Will update A1c at f/u lab visit.

## 2018-05-20 NOTE — Assessment & Plan Note (Signed)
Prior saw Dr Louis Meckel on [address].  Requests referral to urology to re establish care - locally in [place].

## 2018-05-20 NOTE — Assessment & Plan Note (Signed)
Appreciate cards and thoracic surgery care.

## 2018-05-20 NOTE — Assessment & Plan Note (Signed)
Continue lasix 20mg  daily. Stop OTC potassium supplement given hyperkalemia.

## 2018-06-18 NOTE — Progress Notes (Signed)
Follow-up Outpatient Visit Date: 06/19/2018  Primary Care Provider: [name], MD [place] [id number]  Chief Complaint: Follow-up CAD  HPI:  Mr. Yo is a 77 y.o. year-old male with history of coronary artery disease status post CABG in 08/9145, chronic systolic heart failure secondary to ischemic cardiomyopathy, hypertension, hyperlipidemia, who presents for follow-up of coronary artery diease, cardiomyopathy, and atrial flutter.  I last saw Mr. Postle in late July, at which time he was doing well.  Subsequent event monitor did not show evidence of recurrent atrial flutter.  He was therefore transitioned from apixaban to aspirin 81 mg daily, given history of significant gum bleeding.  Today, Mr. Engram reports doing very well.  He denies chest pain, shortness of breath, palpitations, lightheadedness, and edema.  Gum bleeding continued after discontinuation of apixaban.  However, he was seen by ENT and was given systemic and topical antibiotics with resolution of bleeding.  He remains on ASA 81 mg daily.  He exercises regularly and would like to begin hiking.  Mr. Groeneveld notes that his blood pressure is typically well-controlled when he is relaxed after taking carvedilol.  However, by the Jackie Littlejohn of the day, his systolic blood pressure has been as high as 190 mmHg.  --------------------------------------------------------------------------------------------------  Past Medical History:  Diagnosis Date  . Anxiety   . Atrial flutter ([place]) 01/07/2018   Post-op after CABG  . Coronary artery disease 2003   a. nonobstructive CAD by cath in 2003 and 2005; b.  Status post three-vessel CABG on 12/12/2017 with LIMA to LAD, sequential reverse SVG to OM1 and distal LCx  . Hearing loss    bilateral  . History of benign prostatic hypertrophy   . History of radiation therapy 01/31/10-03/22/10   right tonsil/right neck node 7000 cGy 35 sessions, high risk lymph node volume  5940 cGy 35 sessions, low risk lymph node vol 5600 cGy 35 sessions  . 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 HTN (hypertension)   . Hyperlipidemia    diet controlled- taking medication d/t ensure drinking  . Hypothyroid 01/16/2013  . Ischemic cardiomyopathy    a. 10/2017: echo showing reduced EF of 40-45%, HK of the anterior, anteroseptal and apical myocardium with Grade 1 DD.   Marland Kitchen Neck pain   . Squamous cell carcinoma    right tonsil- 2011  . Thrombocytopenia ([place]) 02/02/2012   unclear etiology  . Tinnitus    Past Surgical History:  Procedure Laterality Date  . CARDIAC CATHETERIZATION  2003, 2005   40% blockage, two 30% blockages treated medically Ron Parker)  . CARDIOVERSION N/A 02/12/2018   Procedure: CARDIOVERSION;  Surgeon: [name], MD;  Location: [facility];  Service: Cardiovascular;  Laterality: N/A;  . COLONOSCOPY  03/2015   TA x3, HP, melanosis coli, severe diverticulosis, rpt 3 yrs ([place])  . CORONARY ARTERY BYPASS GRAFT N/A 12/12/2017   Procedure: CORONARY ARTERY BYPASS GRAFTING (CABG) x3 using the right greater saphenous vein harvested endoscopically and the left internal mammary artery. LIMA to LAD, SEQ SVG to OM1 & OM2;  Surgeon: [name], MD;  Location: [facility];  Service: Open Heart Surgery;  Laterality: N/A;  . DENTAL SURGERY     extractions  . IR THORACENTESIS ASP PLEURAL SPACE W/IMG GUIDE  01/17/2018  . KNEE ARTHROSCOPY Left   . LEFT HEART CATH AND CORONARY ANGIOGRAPHY N/A 12/10/2017   Procedure: LEFT HEART CATH AND CORONARY ANGIOGRAPHY;  Surgeon: [name], MD;  Location: [facility];  Service: Cardiovascular;  Laterality: N/A;  . PEG PLACEMENT  02/2010  . TEE WITHOUT CARDIOVERSION N/A 12/12/2017   Procedure: TRANSESOPHAGEAL ECHOCARDIOGRAM (TEE);  Surgeon: [name], MD;  Location: [facility];  Service: Open Heart Surgery;  Laterality: N/A;  . TONSILLECTOMY    . VASECTOMY      Current Meds  Medication Sig  . Ascorbic Acid (VITAMIN C PO) Take by  mouth daily.  Marland Kitchen aspirin EC 81 MG tablet Take 1 tablet (81 mg total) by mouth daily.  . carvedilol (COREG) 6.25 MG tablet Take 1 tablet (6.25 mg) by mouth twice daily  . Docusate Sodium (STOOL SOFTENER) 100 MG capsule Take 350 mg by mouth daily.   . furosemide (LASIX) 20 MG tablet Take 1 tablet (20 mg total) by mouth daily.  Marland Kitchen MAGNESIUM PO Take 1 tablet by mouth daily.  . Multiple Vitamin (MULTIVITAMIN) tablet Take 1 tablet by mouth daily.  . nitroGLYCERIN (NITROSTAT) 0.4 MG SL tablet Place 1 tablet (0.4 mg total) under the tongue every 5 (five) minutes as needed for chest pain.  . rosuvastatin (CRESTOR) 20 MG tablet Take 1 tablet (20 mg total) by mouth daily.  . zaleplon (SONATA) 10 MG capsule Take 1 capsule (10 mg total) by mouth at bedtime as needed for sleep.    Allergies: Ace inhibitors and Morphine and related  Social History   Tobacco Use  . Smoking status: Never Smoker  . Smokeless tobacco: Never Used  Substance Use Topics  . Alcohol use: Yes    Comment: 6 glasses of wine per year  . Drug use: No    Family History  Problem Relation Age of Onset  . Coronary artery disease Father   . Heart attack Father 32  . Heart disease Father   . Stroke Mother   . Colon cancer Neg Hx   . Esophageal cancer Neg Hx   . Rectal cancer Neg Hx   . Stomach cancer Neg Hx     Review of Systems: A 12-system review of systems was performed and was negative except as noted in the HPI.  --------------------------------------------------------------------------------------------------  Physical Exam: BP (!) 150/80 (BP Location: Left Arm, Patient Position: Sitting, Cuff Size: Normal)   Pulse (!) 57   Ht 6\' 2"  (1.88 m)   Wt 208 lb 12 oz (94.7 kg)   BMI 26.80 kg/m   General:  NAD. HEENT: No conjunctival pallor or scleral icterus. Moist mucous membranes.  OP clear. Neck: Supple without lymphadenopathy, thyromegaly, JVD, or HJR. Lungs: Normal work of breathing. Clear to auscultation  bilaterally without wheezes or crackles. Heart: Regular rate and rhythm without murmurs, rubs, or gallops. Non-displaced PMI. Abd: Bowel sounds present. Soft, NT/ND without hepatosplenomegaly Ext: Trace pretibial edema bilaterally. Skin: Warm and dry without rash.  EKG:  Sinus bradycardia (HR 57 bpm) with occasional PVC's, left axis deviation, and LVH.  Lab Results  Component Value Date   WBC 3.8 (L) 05/13/2018   HGB 16.9 05/13/2018   HCT 49.3 05/13/2018   MCV 91.4 05/13/2018   PLT 111.0 (L) 05/13/2018    Lab Results  Component Value Date   NA 141 05/13/2018   K 5.4 (H) 05/13/2018   CL 106 05/13/2018   CO2 30 05/13/2018   BUN 20 05/13/2018   CREATININE 0.79 05/13/2018   GLUCOSE 130 (H) 05/13/2018   ALT 16 03/20/2018    Lab Results  Component Value Date   CHOL 119 03/20/2018   HDL 37 (L) 03/20/2018   LDLCALC 49 03/20/2018  LDLDIRECT 101.0 10/13/2014   TRIG 163 (H) 03/20/2018   CHOLHDL 3.2 03/20/2018    --------------------------------------------------------------------------------------------------  ASSESSMENT AND PLAN: Coronary artery disease Mr. Walth has recovered well from his CABG 6 months ago.  He is very active, having completed cardiac rehab, and does not have any symptoms or worsening coronary insufficiency.  I have encouraged him to remain active; I think it is fine for him to begin hiking.  We will continue current doses of ASA, carvedilol, and rosuvastatin.  Ischemic cardiomyopathy LVEF mildly reduced on repeat echo in August.  Mr. Perry has trace ankle edema but otherwise appears euvolemic with NYHA class I symptoms.  We will continue current dose of carvedilol; resting bradycardia precludes uptitration.  I am hesitant to rechallengehim with an ACEI/ARB, given history of possible angioedema in the past.  We will continue furosemide 20 mg daily.  Atrial flutter No evidence of recurrence following DCCV by symptoms, event monitor, or EKG today.  Given  issues with gum bleeding in he past, we will defer anticoagulation.  Continue carvedilol and aspirin.  Hypertension BP not well controlled today and also quite elevated at times at home.  We will continue current dose of carvedilol and add amlodipine 2.5 mg daily.  I will have Mr. Morren return in about 2 weeks for a BP check.  Hyperlipidemia LDL at goal (<70).  Continue rosuvastatin.  Follow-up: Return to clinic in 3-4 months.  [name], MD 06/19/2018 1:50 PM

## 2018-06-19 ENCOUNTER — Ambulatory Visit (INDEPENDENT_AMBULATORY_CARE_PROVIDER_SITE_OTHER): Payer: PPO | Admitting: Internal Medicine

## 2018-06-19 ENCOUNTER — Encounter: Payer: Self-pay | Admitting: Internal Medicine

## 2018-06-19 VITALS — BP 150/80 | HR 57 | Ht 74.0 in | Wt 208.8 lb

## 2018-06-19 DIAGNOSIS — E785 Hyperlipidemia, unspecified: Secondary | ICD-10-CM | POA: Diagnosis not present

## 2018-06-19 DIAGNOSIS — I483 Typical atrial flutter: Secondary | ICD-10-CM | POA: Diagnosis not present

## 2018-06-19 DIAGNOSIS — I255 Ischemic cardiomyopathy: Secondary | ICD-10-CM

## 2018-06-19 DIAGNOSIS — I2510 Atherosclerotic heart disease of native coronary artery without angina pectoris: Secondary | ICD-10-CM

## 2018-06-19 DIAGNOSIS — I10 Essential (primary) hypertension: Secondary | ICD-10-CM | POA: Diagnosis not present

## 2018-06-19 MED ORDER — AMLODIPINE BESYLATE 2.5 MG PO TABS: 2.5000 mg | ORAL_TABLET | Freq: Every day | ORAL | 2 refills | Status: DC

## 2018-06-19 NOTE — Patient Instructions (Signed)
Medication Instructions:  Your physician has recommended you make the following change in your medication:  1- START Amlodipine 2.5 mg by mouth once a day.  If you need a refill on your cardiac medications before your next appointment, please call your pharmacy.   Lab work: none If you have labs (blood work) drawn today and your tests are completely normal, you will receive your results only by: Marland Kitchen MyChart Message (if you have MyChart) OR . A paper copy in the mail If you have any lab test that is abnormal or we need to change your treatment, we will call you to review the results.  Testing/Procedures: none  Follow-Up: Your physician recommends that you schedule a follow-up appointment in: 2 [place].   At [facility], you and your health needs are our priority.  As part of our continuing mission to provide you with exceptional heart care, we have created designated Provider Care Teams.  These Care Teams include your primary Cardiologist (physician) and Advanced Practice Providers (APPs -  Physician Assistants and Nurse Practitioners) who all work together to provide you with the care you need, when you need it. You will need a follow up appointment in 3-4 months.  Please call our office 2 months in advance to schedule this appointment.  You may see [name], MD or one of the following Advanced Practice Providers on your designated Care Team:   Murray Hodgkins, NP Christell Faith, PA-C . Marrianne Mood, PA-C

## 2018-06-24 ENCOUNTER — Telehealth: Payer: Self-pay | Admitting: *Deleted

## 2018-06-24 NOTE — Telephone Encounter (Signed)
Patient left Medical Clearance Form for [facility] for the "Take A [employer] Program for seniors they offer. Dr End checked "I know of no medical reason why this person should not participate." Dr End signed it as well. Form faxed to [phone]. Original mailed to patient. MyChart message sent to patient letting him know as well.

## 2018-07-01 ENCOUNTER — Ambulatory Visit: Payer: PPO | Admitting: Urology

## 2018-07-01 ENCOUNTER — Encounter: Payer: Self-pay | Admitting: Urology

## 2018-07-01 DIAGNOSIS — N3281 Overactive bladder: Secondary | ICD-10-CM

## 2018-07-01 LAB — BLADDER SCAN AMB NON-IMAGING

## 2018-07-01 LAB — URINALYSIS, COMPLETE
BILIRUBIN UA: NEGATIVE
GLUCOSE, UA: NEGATIVE
Ketones, UA: NEGATIVE
LEUKOCYTES UA: NEGATIVE
Nitrite, UA: NEGATIVE
PH UA: 6 (ref 5.0–7.5)
Protein, UA: NEGATIVE
RBC UA: NEGATIVE
Specific Gravity, UA: 1.025 (ref 1.005–1.030)
Urobilinogen, Ur: 0.2 mg/dL (ref 0.2–1.0)

## 2018-07-01 LAB — MICROSCOPIC EXAMINATION
EPITHELIAL CELLS (NON RENAL): NONE SEEN /HPF (ref 0–10)
WBC, UA: NONE SEEN /hpf (ref 0–5)

## 2018-07-01 NOTE — Progress Notes (Signed)
07/01/2018 6:33 PM   [name] 1941-02-14 [id number]  Referring provider: [name], MD [place], [place] [address]  CC: Overactive bladder  HPI: I had the pleasure of meeting Mr. Voiles in urology clinic today in consultation for overactive bladder symptoms from Dr. Danise Mina.  Mr. Sunday Spillers is a 77 year old retired [employer] with an extensive cardiac history and history of recent CABG his primary urologic complaints include urgency and frequency during the day every 3 hours with mild urge incontinence, and primarily nocturia every hour overnight.  He reports he voids with a very good stream.  PVR in clinic today was 0 cc.  He was previously followed by Dr. Risa Grill and Dr. Louis Meckel at [facility] urology in [place] who prescribed a different overactive bladder medications including an anticholinergic and Myrbetriq.  The patient did not feel he had significant improvement with these medications.  He denies any history of urinary tract infections or gross hematuria.  He does take furosemide 20 mg daily.  He is only anticoagulation is baby aspirin daily.  He is very active and is very frustrated by his urinary symptoms currently.  He primarily drinks water during the day.  He has a history of radiation to the mouth area for an oral cancer, and since then he is only able to consume Glucerna and water.  He does not eat solid foods.  There are no aggravating or alleviating factors.  Severity is moderate to severe.  Duration is greater than 5 years.  Recent PSA 0.59   PMH: Past Medical History:  Diagnosis Date  . Anxiety   . Atrial flutter ([place]) 01/07/2018   Post-op after CABG  . Cancer ([place])   . Coronary artery disease 2003   a. nonobstructive CAD by cath in 2003 and 2005; b.  Status post three-vessel CABG on 12/12/2017 with LIMA to LAD, sequential reverse SVG to OM1 and distal LCx  . Hearing loss    bilateral  . Heart disease   . Heart murmur   . History  of benign prostatic hypertrophy   . History of radiation therapy 01/31/10-03/22/10   right tonsil/right neck node 7000 cGy 35 sessions, high risk lymph node volume 5940 cGy 35 sessions, low risk lymph node vol 5600 cGy 35 sessions  . 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 HTN (hypertension)   . Hyperlipidemia    diet controlled- taking medication d/t ensure drinking  . Hypothyroid 01/16/2013  . Ischemic cardiomyopathy    a. 10/2017: echo showing reduced EF of 40-45%, HK of the anterior, anteroseptal and apical myocardium with Grade 1 DD.   Marland Kitchen Neck pain   . Squamous cell carcinoma    right tonsil- 2011  . Thrombocytopenia ([place]) 02/02/2012   unclear etiology  . Tinnitus     Surgical History: Past Surgical History:  Procedure Laterality Date  . artoscopic knee    . CARDIAC CATHETERIZATION  2003, 2005   40% blockage, two 30% blockages treated medically [facility])  . CARDIOVERSION N/A 02/12/2018   Procedure: CARDIOVERSION;  Surgeon: [name], MD;  Location: [facility] ORS;  Service: Cardiovascular;  Laterality: N/A;  . COLONOSCOPY  03/2015   TA x3, HP, melanosis coli, severe diverticulosis, rpt 3 yrs ([place])  . CORONARY ARTERY BYPASS GRAFT N/A 12/12/2017   Procedure: CORONARY ARTERY BYPASS GRAFTING (CABG) x3 using the right greater saphenous vein harvested endoscopically and the left internal mammary artery. LIMA to LAD, SEQ SVG to [place];  Surgeon: Lanelle Bal  B, MD;  Location: [facility];  Service: Open Heart Surgery;  Laterality: N/A;  . DENTAL SURGERY     extractions  . IR THORACENTESIS ASP PLEURAL SPACE W/IMG GUIDE  01/17/2018  . KNEE ARTHROSCOPY Left   . LEFT HEART CATH AND CORONARY ANGIOGRAPHY N/A 12/10/2017   Procedure: LEFT HEART CATH AND CORONARY ANGIOGRAPHY;  Surgeon: [name], MD;  Location: [facility] CV LAB;  Service: Cardiovascular;  Laterality: N/A;  . PEG PLACEMENT  02/2010  . TEE WITHOUT CARDIOVERSION N/A 12/12/2017   Procedure: TRANSESOPHAGEAL ECHOCARDIOGRAM (TEE);  Surgeon:  [name], MD;  Location: [facility];  Service: Open Heart Surgery;  Laterality: N/A;  . TONSILLECTOMY    . triple bypass    . VASECTOMY      Allergies:  Allergies  Allergen Reactions  . Ace Inhibitors Swelling and Other (See Comments)    Possible angioedema (upper lip swelling)  . Morphine And Related Swelling    SWELLING REACTION UNSPECIFIED  [severity rated per PMH, 12/11/2017]    Family History: Family History  Problem Relation Age of Onset  . Coronary artery disease Father   . Heart attack Father 41  . Heart disease Father   . Stroke Mother   . Colon cancer Neg Hx   . Esophageal cancer Neg Hx   . Rectal cancer Neg Hx   . Stomach cancer Neg Hx   . Kidney cancer Neg Hx   . Kidney failure Neg Hx   . Prostate cancer Neg Hx   . Tuberculosis Neg Hx     Social History:  reports that he has never smoked. He has never used smokeless tobacco. He reports that he drinks alcohol. He reports that he does not use drugs.  ROS: Please see flowsheet from today's date for complete review of systems.  Physical Exam: There were no vitals taken for this visit.   Constitutional:  Alert and oriented, No acute distress. Cardiovascular: No clubbing, cyanosis, or edema. Respiratory: Normal respiratory effort, no increased work of breathing. GI: Abdomen is soft, nontender, nondistended, no abdominal masses GU: No CVA tenderness Lymph: No cervical or inguinal lymphadenopathy. Skin: No rashes, bruises or suspicious lesions. Neurologic: Grossly intact, no focal deficits, moving all 4 extremities. Psychiatric: Normal mood and affect.  Laboratory Data: PSA 0.59  Urinalysis today 0 WBCs, 0-2 RBCs, few bacteria, nitrite negative  Pertinent Imaging: None to review  Assessment & Plan:   In summary, Mr. Sunday Spillers is a 77 year old male with a history of extensive coronary artery disease status post CABG with severe urinary symptoms, primarily consistent with overactive bladder.  We  discussed that overactive bladder (OAB) is not a disease, but is a symptom complex that is generally not life-threatening.  Symptoms typically include urinary urgency, frequency, and urge incontinence.  There are numerous treatment options, however there are risks and benefits with both medical and surgical management.  First-line treatment is behavioral therapies including bladder training, pelvic floor muscle training, and fluid management.  Second line treatments include oral antimuscarinics(Ditropan er, Trospium) and beta-3 agonist (Mybetriq). There is typically a period of medication trial (4-8 weeks) to find the optimal therapy and dosing. If symptoms are bothersome despite the above management, third line options include intra-detrusor botox, peripheral tibial nerve stimulation (PTNS), and interstim (SNS). These are more invasive treatments with higher side effect profile, but may improve quality of life for patients with severe OAB symptoms.   His primary complaint is nocturia getting up every hour overnight.  I discussed his  case with the pharmacist on-call, and they did feel that it would be safe for a trial of Nocdurna with his low-dose of furosemide and hypertension.  I discussed with him at length the risks and benefits.  Samples were provided today.  Follow-up in 1 week for sodium check.  Discontinue medication and call the clinic if he develops any lightheadedness, shortness of breath, chest pain or symptoms of electrolyte abnormalities  Follow-up in 4 weeks to discuss improvement on Nocdurna.  Proceed with urodynamics if still having significant symptoms.  May be a good candidate for intra-detrusor Botox if urodynamics demonstrates detrusor overactivity.  Billey Co, [facility] [address], [place], [place] [address] [phone]

## 2018-07-02 ENCOUNTER — Ambulatory Visit: Payer: Self-pay

## 2018-07-03 ENCOUNTER — Ambulatory Visit: Payer: PPO | Admitting: *Deleted

## 2018-07-03 VITALS — BP 170/89 | HR 75 | Ht 74.0 in | Wt 211.2 lb

## 2018-07-03 DIAGNOSIS — I10 Essential (primary) hypertension: Secondary | ICD-10-CM

## 2018-07-03 MED ORDER — AMLODIPINE BESYLATE 5 MG PO TABS: 5.0000 mg | ORAL_TABLET | Freq: Every day | ORAL | 3 refills | Status: DC

## 2018-07-03 NOTE — Progress Notes (Signed)
1.) Reason for visit: BP check  2.) Name of MD requesting visit: Dr End  3.) H&P: HTN**  4.) ROS related to problem: Patient here for BP check per Dr Darnelle Bos request. At [facility] on 06/19/18 - Dr End started amlodipine 2.5 mg daily. Patient start it at that time but stopped his carvedilol on his own. He takes his amlodipine at 8 pm at night. Patient states he "feels fine." Blood pressures at home are as follows:  11/8 126/68, 59  11/9 165/90, 63  11/10 154/79, 60  11/11 143/80, 65   11/12 136/79, 74  11/13 166/95, 67  He also saw urology recently and they wanted him to start Nocdura for frequent urination at night. Patient wants to know if this is ok per Dr End.    5.) Assessment and plan per MD: Discussed with Dr End. Advised ok for patient to remain off carvedilol and increase amlodipine to 5 mg once a day. Dr End said ok to start [place] and follow management with urology.   Patient given AVS and verbalized understanding of instructions.

## 2018-07-03 NOTE — Patient Instructions (Signed)
Medication Instructions:  Your physician has recommended you make the following change in your medication:  1- ok to stop carvedilol. 2- INCREASE Amlodipine 5 mg by mouth once day.   If you need a refill on your cardiac medications before your next appointment, please call your pharmacy.   Lab work: none If you have labs (blood work) drawn today and your tests are completely normal, you will receive your results only by: Marland Kitchen MyChart Message (if you have MyChart) OR . A paper copy in the mail If you have any lab test that is abnormal or we need to change your treatment, we will call you to review the results.  Testing/Procedures: none  Follow-Up: At [facility], you and your health needs are our priority.  As part of our continuing mission to provide you with exceptional heart care, we have created designated Provider Care Teams.  These Care Teams include your primary Cardiologist (physician) and Advanced Practice Providers (APPs -  Physician Assistants and Nurse Practitioners) who all work together to provide you with the care you need, when you need it. You will need a follow up appointment AS SCHEDULED IN February.

## 2018-07-09 ENCOUNTER — Other Ambulatory Visit: Payer: Self-pay

## 2018-07-09 DIAGNOSIS — N3281 Overactive bladder: Secondary | ICD-10-CM

## 2018-07-10 ENCOUNTER — Other Ambulatory Visit: Payer: PPO

## 2018-07-10 DIAGNOSIS — N3281 Overactive bladder: Secondary | ICD-10-CM

## 2018-07-11 LAB — BASIC METABOLIC PANEL
BUN/Creatinine Ratio: 21 (ref 10–24)
BUN: 18 mg/dL (ref 8–27)
CALCIUM: 9.8 mg/dL (ref 8.6–10.2)
CHLORIDE: 104 mmol/L (ref 96–106)
CO2: 21 mmol/L (ref 20–29)
Creatinine, Ser: 0.84 mg/dL (ref 0.76–1.27)
GFR calc Af Amer: 97 mL/min/{1.73_m2} (ref >59)
GFR calc non Af Amer: 84 mL/min/{1.73_m2} (ref >59)
GLUCOSE: 113 mg/dL — AB (ref 65–99)
Potassium: 4.5 mmol/L (ref 3.5–5.2)
Sodium: 141 mmol/L (ref 134–144)

## 2018-07-30 ENCOUNTER — Telehealth: Payer: Self-pay | Admitting: Internal Medicine

## 2018-07-30 NOTE — Telephone Encounter (Signed)
Patient came by office  Pt c/o medication issue:  1. Name of Medication: amLODipine (NORVA) 5 MG  2. How are you currently taking this medication (dosage and times per day)? 5 MG - 1 tablet daily   3. Are you having a reaction (difficulty breathing--STAT)? no  4. What is your medication issue? Patient would like to know if maybe he should up his dosage a little.  Says it is doing pretty good for him but it does continue to be real high in the morning and at night.  During the day it is normal and exercise does help it a lot.  Please call to discuss.  Patient may want to come in a do a blood pressure check.

## 2018-07-31 MED ORDER — AMLODIPINE BESYLATE 10 MG PO TABS: 10.0000 mg | ORAL_TABLET | Freq: Every day | ORAL | 3 refills | Status: DC

## 2018-07-31 NOTE — Telephone Encounter (Signed)
Blood pressure appears to be upper normal to elevated on most readings.  I recommend increasing amlodipine to 10 mg daily with continued BP monitoring.  I have no issue with Phillip [name] donating blood but he will need to check with his blood collection agency regarding his qualification.  [name], MD [facility] Pager: [phone]

## 2018-07-31 NOTE — Telephone Encounter (Signed)
I spoke with the patient. He was wanted to update Dr. Saunders Revel on his BP readings and ask the question if he felt that his current amlodipine dose may need to be increased. He has brought his home BP monitor in for correlation at prior visits and this was fairly close to our readings in clinic.   Amlodipine was increased to 5 mg once daily on 07/03/18 after his last nurse visit BP check.  Per the patient, he takes amlodipine 5 mg daily at 8 pm & lasix 20 mg daily at 8am.  BP (HR) readings: 11/28- 3:30 pm- 137/76 (58) 11/29- 1:30 pm- 142/79 (59) 12/2- 8:47 am- 158/95 (65)          9:00 pm- 133/76 (62)          10:00 pm- 136/68 (65) 12/3- 4:43 pm- 137/77 (66) 12/6- 10:30 pm- 160/87 (58) 12/7- 11:00 am- 133/72 (51) 12/8- 7:30 am- 176/89 (56) 12/9- 7:30 pm- 147/82 (59) 12/10- 7:00 pm- 158/83 (71)            8:00 pm- 146/87 (63) 12/11- 7:00 am- 144/84 (55)  I advised him I will forward these readings to Dr. Saunders Revel to review.  He was also asking how long he needs to wait to give blood after CABG, or can he do this at all. CABG done-  12/13/17 with Dr. Servando Snare.  The patient is aware we will call him back with further recommendations from Dr. Saunders Revel regarding his medications and donating blood.  He voices understanding and is agreeable.  The patient confirms he is not currently having any symptoms with the variation in his blood pressure nor is he having any lower extremity edema. 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

## 2018-07-31 NOTE — Telephone Encounter (Signed)
I spoke with the patient.  He is aware of Dr. Darnelle Bos recommendations to increase amlodipine to 10 mg once daily. He would like a new RX sent to the [facility] on [address]. He is aware I will send this in for # 90 day supply.  The patient is also aware that per Dr. Saunders Revel, he may give blood, but just to check with the blood donation agency regarding his qualification to do so. The patient voices understanding of all of the above and is agreeable.

## 2018-08-01 ENCOUNTER — Encounter: Payer: Self-pay | Admitting: Urology

## 2018-08-01 ENCOUNTER — Ambulatory Visit: Payer: PPO | Admitting: Urology

## 2018-08-01 VITALS — BP 115/72 | HR 75 | Ht 74.0 in | Wt 209.5 lb

## 2018-08-01 DIAGNOSIS — H2513 Age-related nuclear cataract, bilateral: Secondary | ICD-10-CM | POA: Diagnosis not present

## 2018-08-01 DIAGNOSIS — R351 Nocturia: Secondary | ICD-10-CM

## 2018-08-01 DIAGNOSIS — N3281 Overactive bladder: Secondary | ICD-10-CM

## 2018-08-01 LAB — BLADDER SCAN AMB NON-IMAGING: Scan Result: 29

## 2018-08-01 MED ORDER — DESMOPRESSIN ACETATE 55.3 MCG SL SUBL: 55.3000 ug | SUBLINGUAL_TABLET | Freq: Every day | SUBLINGUAL | 11 refills | Status: DC

## 2018-08-01 NOTE — Progress Notes (Signed)
   08/01/2018 12:45 PM   Phillip [name] 1941-01-16 [id number]  Reason for visit: Follow up nocturia  HPI: I saw Phillip [name] back in urology clinic today for severe nocturia.  When I saw him previously in early November 2019 he reported 7-8 episodes per night of nocturia and was significantly frustrated.  He has minimal symptoms during the day with a strong stream and occasional urgency or frequency every 2-3 hours.  He does have occasional urge incontinence, but this is rare.  His PVRs have remained very low in clinic.  We started nocdurna.  His sodium has remained stable.  He reports significant improvement with this medication with nocturia only 3-4 times per night currently.  He denies any shortness of breath, or lower extremity swelling.  With his history of oral radiation, he consumes only water and Glucerna during the day.  He does drink 3 bottles of Glucerna around 8 PM.   ROS: Please see flowsheet from today's date for complete review of systems.  Assessment & Plan:   In summary, Phillip [name] is a 77 year old male with severe nocturia 7-8 times per night that has improved significantly since starting nocdurna.  He has minimal symptoms during the daytime.  We discussed at length that it is normal for patients in their 70s and 80s to get up 2-3 times per night, especially if he is consuming a large volume of fluid in the evenings.  We discussed realistic expectations at length.  He would like to pursue further work-up with urodynamics, with consideration of Botox, PTNS, or InterStim if he has demonstrated detrusor overactivity.  Nocdurna refilled.  Follow-up in 1 to 2 months with results of urodynamics from [facility], referral placed  A total of 15 minutes were spent face-to-face with the patient, greater than 50% was spent in patient education, counseling, and coordination of care regarding overactive bladder and nocturia.  Billey Co, [facility] [address], [place], [place] [address] [phone]

## 2018-08-07 ENCOUNTER — Other Ambulatory Visit: Payer: Self-pay

## 2018-08-07 ENCOUNTER — Telehealth: Payer: Self-pay | Admitting: Urology

## 2018-08-07 NOTE — Telephone Encounter (Signed)
Pt LMOM requesting a return call from [employer] assistant.  Please call pt [phone]

## 2018-08-07 NOTE — Telephone Encounter (Signed)
Dr. Diamantina Providence,  Pt called concerned because he received a phone call from [facility] about scheduling his urodynamics and they informed the pt that he already had one in 10/15/17 (these records are in Epic under media) and they informed the pt to check to assure that you are aware of this but you still want to have a repeat test. Pt expressed his concern of not wanting to get another test unless he absolutely needs another one.

## 2018-08-09 NOTE — Telephone Encounter (Signed)
I have moved his app up   Thanks, Sharyn Lull

## 2018-08-09 NOTE — Telephone Encounter (Signed)
Please let him know we got the records from his urodynamics in [place]. Do not need to repeat that test. Can move up follow up with me to discuss results and next steps to my next available appt.  Nickolas Madrid, MD 08/09/2018

## 2018-08-19 ENCOUNTER — Other Ambulatory Visit (INDEPENDENT_AMBULATORY_CARE_PROVIDER_SITE_OTHER): Payer: PPO

## 2018-08-19 DIAGNOSIS — R946 Abnormal results of thyroid function studies: Secondary | ICD-10-CM

## 2018-08-19 DIAGNOSIS — D696 Thrombocytopenia, unspecified: Secondary | ICD-10-CM

## 2018-08-19 DIAGNOSIS — E875 Hyperkalemia: Secondary | ICD-10-CM

## 2018-08-19 DIAGNOSIS — R7303 Prediabetes: Secondary | ICD-10-CM | POA: Diagnosis not present

## 2018-08-19 DIAGNOSIS — E039 Hypothyroidism, unspecified: Secondary | ICD-10-CM

## 2018-08-19 LAB — CBC WITH DIFFERENTIAL/PLATELET
BASOS PCT: 0.5 % (ref 0.0–3.0)
Basophils Absolute: 0 10*3/uL (ref 0.0–0.1)
EOS ABS: 0.1 10*3/uL (ref 0.0–0.7)
Eosinophils Relative: 3.3 % (ref 0.0–5.0)
HCT: 51.8 % (ref 39.0–52.0)
Hemoglobin: 17.8 g/dL — ABNORMAL HIGH (ref 13.0–17.0)
Lymphocytes Relative: 15.2 % (ref 12.0–46.0)
Lymphs Abs: 0.6 10*3/uL — ABNORMAL LOW (ref 0.7–4.0)
MCHC: 34.4 g/dL (ref 30.0–36.0)
MCV: 97.3 fl (ref 78.0–100.0)
Monocytes Absolute: 0.4 10*3/uL (ref 0.1–1.0)
Monocytes Relative: 9.9 % (ref 3.0–12.0)
Neutro Abs: 2.9 10*3/uL (ref 1.4–7.7)
Neutrophils Relative %: 71.1 % (ref 43.0–77.0)
Platelets: 133 10*3/uL — ABNORMAL LOW (ref 150.0–400.0)
RBC: 5.33 Mil/uL (ref 4.22–5.81)
RDW: 12.6 % (ref 11.5–15.5)
WBC: 4.1 10*3/uL (ref 4.0–10.5)

## 2018-08-19 LAB — VITAMIN B12: Vitamin B-12: 779 pg/mL (ref 211–911)

## 2018-08-19 LAB — BASIC METABOLIC PANEL
BUN: 15 mg/dL (ref 6–23)
CALCIUM: 9.6 mg/dL (ref 8.4–10.5)
CO2: 28 meq/L (ref 19–32)
Chloride: 106 mEq/L (ref 96–112)
Creatinine, Ser: 0.78 mg/dL (ref 0.40–1.50)
GFR: 102.07 mL/min (ref >60.00)
Glucose, Bld: 129 mg/dL — ABNORMAL HIGH (ref 70–99)
Potassium: 4 mEq/L (ref 3.5–5.1)
Sodium: 140 mEq/L (ref 135–145)

## 2018-08-19 LAB — TSH: TSH: 4.96 u[IU]/mL — AB (ref 0.35–4.50)

## 2018-08-19 LAB — HEMOGLOBIN A1C: Hgb A1c MFr Bld: 5.9 % (ref 4.6–6.5)

## 2018-08-19 LAB — T4, FREE: Free T4: 0.73 ng/dL (ref 0.60–1.60)

## 2018-08-19 LAB — FOLATE: Folate: >23.9 ng/mL (ref >5.9)

## 2018-08-20 LAB — PATHOLOGIST SMEAR REVIEW

## 2018-08-26 ENCOUNTER — Ambulatory Visit: Payer: PPO | Admitting: Urology

## 2018-08-26 ENCOUNTER — Encounter: Payer: Self-pay | Admitting: Urology

## 2018-08-26 VITALS — BP 169/80 | HR 64 | Ht 74.0 in | Wt 209.8 lb

## 2018-08-26 DIAGNOSIS — N3281 Overactive bladder: Secondary | ICD-10-CM

## 2018-08-26 DIAGNOSIS — R351 Nocturia: Secondary | ICD-10-CM

## 2018-08-26 NOTE — Progress Notes (Signed)
   08/26/2018 2:08 PM   Phillip [name] 15-Aug-1941 [id number]  Reason for visit: Follow up OAB/nocturia  HPI: I saw Phillip [name] back in urology clinic to discuss his overactive bladder and nocturia.  To briefly summarize, he is a 78 year old male who lives primarily off of water and Glucerna secondary to history of oral radiation whose chief complaint is nocturia 7-8 times per night.  He is failed Myrbetriq and anticholinergics by outside providers previously.  I previously started him on nocdurna nightly which improved his nocturia significantly to 3-4 times per night.  However, this medication was cost prohibitive and he has stopped taking it.  He is also here to discuss his urodynamics from [facility].  These were notable for a bladder capacity of 225 mL's, with unstable contractions and voiding with urgency.  Max flow was 13 mL's per second with PVR of 0.  Flow appeared unobstructed.  We had a long discussion about his symptoms.  I recommended goodRx.com to find better pricing for the nocdurna.  I also strongly recommended he avoid fluids after 7 PM in the evenings and double voiding prior to bed.  Finally, I recommended considering PTNS for his urodynamic demonstrated overactive bladder symptoms.  He is amenable to pursuing PTNS.  We discussed the risks and benefits at length.  Schedule PTNS RTC 50-month for symptom check  A total of 15 minutes were spent face-to-face with the patient, greater than 50% was spent in patient education, counseling, and coordination of care regarding overactive bladder, behavioral strategies, and PTNS.   Billey Co, [facility] [address] [place], [place] [address] [phone]

## 2018-08-27 ENCOUNTER — Ambulatory Visit (INDEPENDENT_AMBULATORY_CARE_PROVIDER_SITE_OTHER): Payer: PPO | Admitting: Urology

## 2018-08-27 DIAGNOSIS — N3281 Overactive bladder: Secondary | ICD-10-CM

## 2018-08-27 DIAGNOSIS — R351 Nocturia: Secondary | ICD-10-CM

## 2018-08-27 NOTE — Progress Notes (Signed)
PTNS  Session # 1  Health & Social Factors: no change Caffeine: 0 Alcohol: 0 Daytime voids #per day: 7 Night-time voids #per night: 7 Urgency: strong Incontinence Episodes #per day: 2 Ankle used: right Treatment Setting: 19 Feeling/ Response: sensory Comments: Patient tolerated well  Preformed By: Elberta Leatherwood, CMA  Follow Up: 1 week #2

## 2018-08-29 ENCOUNTER — Telehealth: Payer: Self-pay | Admitting: Family Medicine

## 2018-08-29 DIAGNOSIS — Z1211 Encounter for screening for malignant neoplasm of colon: Secondary | ICD-10-CM

## 2018-08-29 NOTE — Telephone Encounter (Signed)
Preferred Date Range: Any date 08/29/2018 or later    Preferred Times: Any    Reason: To address the following health maintenance concerns.  Colonoscopy    Comments:  Please schedule. Thank You!   Pt put in request via Mychart. He is scheduled for wellness visit in April. Please advise

## 2018-08-30 NOTE — Telephone Encounter (Signed)
plz touch base with patient - I have placed referral for colonoscopy. Don't think he needs OV unless he has other concerns.

## 2018-08-30 NOTE — Telephone Encounter (Signed)
Spoke with pt relaying Dr. Synthia Innocent message. Pt verbalizes understanding and states he was just responding to an email he received stating he was due for colonoscopy.

## 2018-09-03 ENCOUNTER — Ambulatory Visit (INDEPENDENT_AMBULATORY_CARE_PROVIDER_SITE_OTHER): Payer: PPO

## 2018-09-03 DIAGNOSIS — R351 Nocturia: Secondary | ICD-10-CM | POA: Diagnosis not present

## 2018-09-03 DIAGNOSIS — N3281 Overactive bladder: Secondary | ICD-10-CM

## 2018-09-03 NOTE — Progress Notes (Signed)
PTNS  Session # 2  Health & Social Factors: N/A Caffeine: None Alcohol: None Daytime voids #per day: 7 Night-time voids #per night: 7 Urgency: Strong Incontinence Episodes #per day: 2 Ankle used: Left Treatment Setting: 15 Feeling/ Response: Sensory Comments: N/A  Preformed By: Gordy Clement, CMA (AAMA)  Follow Up: As Scheduled

## 2018-09-10 ENCOUNTER — Ambulatory Visit (INDEPENDENT_AMBULATORY_CARE_PROVIDER_SITE_OTHER): Payer: PPO

## 2018-09-10 DIAGNOSIS — R351 Nocturia: Secondary | ICD-10-CM | POA: Diagnosis not present

## 2018-09-10 NOTE — Progress Notes (Signed)
PTNS  Session # 3  Health & Social Factors: None Caffeine: None  Alcohol: None Daytime voids #per day: 7 Night-time voids #per night: 7 Urgency: Strong Incontinence Episodes #per day: 2 Ankle used: Right Treatment Setting: 7  Feeling/ Response: Sensory Comments: N/A  Preformed By: Gordy Clement, CMA (AAMA)  Follow Up: As Scheduled

## 2018-09-12 DIAGNOSIS — D225 Melanocytic nevi of trunk: Secondary | ICD-10-CM | POA: Diagnosis not present

## 2018-09-12 DIAGNOSIS — D229 Melanocytic nevi, unspecified: Secondary | ICD-10-CM | POA: Diagnosis not present

## 2018-09-12 DIAGNOSIS — Z1283 Encounter for screening for malignant neoplasm of skin: Secondary | ICD-10-CM | POA: Diagnosis not present

## 2018-09-12 DIAGNOSIS — L570 Actinic keratosis: Secondary | ICD-10-CM | POA: Diagnosis not present

## 2018-09-12 DIAGNOSIS — L821 Other seborrheic keratosis: Secondary | ICD-10-CM | POA: Diagnosis not present

## 2018-09-12 DIAGNOSIS — D1800 Hemangioma unspecified site: Secondary | ICD-10-CM | POA: Diagnosis not present

## 2018-09-12 DIAGNOSIS — C44319 Basal cell carcinoma of skin of other parts of face: Secondary | ICD-10-CM | POA: Diagnosis not present

## 2018-09-12 DIAGNOSIS — I8310 Varicose veins of unspecified lower extremity with inflammation: Secondary | ICD-10-CM | POA: Diagnosis not present

## 2018-09-12 DIAGNOSIS — L578 Other skin changes due to chronic exposure to nonionizing radiation: Secondary | ICD-10-CM | POA: Diagnosis not present

## 2018-09-12 DIAGNOSIS — D2230 Melanocytic nevi of unspecified part of face: Secondary | ICD-10-CM | POA: Diagnosis not present

## 2018-09-12 DIAGNOSIS — L812 Freckles: Secondary | ICD-10-CM | POA: Diagnosis not present

## 2018-09-12 DIAGNOSIS — D692 Other nonthrombocytopenic purpura: Secondary | ICD-10-CM | POA: Diagnosis not present

## 2018-09-12 DIAGNOSIS — C4491 Basal cell carcinoma of skin, unspecified: Secondary | ICD-10-CM

## 2018-09-12 HISTORY — DX: Basal cell carcinoma of skin, unspecified: C44.91

## 2018-09-16 ENCOUNTER — Encounter: Payer: Self-pay | Admitting: Family Medicine

## 2018-09-16 ENCOUNTER — Ambulatory Visit (INDEPENDENT_AMBULATORY_CARE_PROVIDER_SITE_OTHER)
Admission: RE | Admit: 2018-09-16 | Discharge: 2018-09-16 | Disposition: A | Discharge status: Home / Self Care | Payer: PPO | Source: Ambulatory Visit | Attending: Family Medicine | Admitting: Family Medicine

## 2018-09-16 ENCOUNTER — Ambulatory Visit (INDEPENDENT_AMBULATORY_CARE_PROVIDER_SITE_OTHER): Payer: PPO | Admitting: Family Medicine

## 2018-09-16 VITALS — BP 132/80 | HR 72 | Temp 98.4°F | Ht 72.5 in | Wt 209.8 lb

## 2018-09-16 DIAGNOSIS — J019 Acute sinusitis, unspecified: Secondary | ICD-10-CM | POA: Diagnosis not present

## 2018-09-16 DIAGNOSIS — R059 Cough, unspecified: Secondary | ICD-10-CM

## 2018-09-16 DIAGNOSIS — R05 Cough: Secondary | ICD-10-CM | POA: Diagnosis not present

## 2018-09-16 MED ORDER — AMOXICILLIN-POT CLAVULANATE 875-125 MG PO TABS: 1.0000 | ORAL_TABLET | Freq: Two times a day (BID) | ORAL | 0 refills | Status: AC

## 2018-09-16 NOTE — Progress Notes (Signed)
BP 132/80 (BP Location: Left Arm, Patient Position: Sitting, Cuff Size: Normal)   Pulse 72   Temp 98.4 F (36.9 C) (Oral)   Ht 6' 0.5" (1.842 m)   Wt 209 lb 12 oz (95.1 kg)   SpO2 92%   BMI 28.06 kg/m   BP Readings from Last 3 Encounters:  09/16/18 132/80  08/26/18 (!) 169/80  08/01/18 115/72    CC: congestion Subjective:    Patient ID: Phillip [name], male    DOB: 12-16-1940, 78 y.o.   MRN: [id number]  HPI: Orel Cooler is a 78 y.o. male presenting on 09/16/2018 for Cough (C/o productive cough, runny nose, nasal congestion, HA, chest congestion. Sxs started abotu 5 wks ago. Tried of Dayquil. )   5 wk h/o cough, head and chest congestion, rhinorrhea, dull HA. Increased productive cough over the past 3 weeks. Some PNDrainage.   No fevers/chills, ear or tooth pain, ST. Wife sick at home. Non smoker.  No h/o asthma.   Tried dayquil. Using humidifier throughout the house. Using nasal saline irrigation.  Regularly uses breathe-rite at night.   Urinary incontinence - currently undergoing PTNS. Spot L eyebrow - healing after recent derm treatment     Relevant past medical, surgical, family and social history reviewed and updated as indicated. Interim medical history since our last visit reviewed. Allergies and medications reviewed and updated. Outpatient Medications Prior to Visit  Medication Sig Dispense Refill  . amLODipine (NORVASC) 10 MG tablet Take 1 tablet (10 mg total) by mouth daily. 90 tablet 3  . Ascorbic Acid (VITAMIN C PO) Take by mouth daily.    Marland Kitchen aspirin EC 81 MG tablet Take 1 tablet (81 mg total) by mouth daily. 90 tablet 3  . Desmopressin Acetate 55.3 MCG SUBL Place under the tongue at bedtime.     Mariane Baumgarten Sodium (STOOL SOFTENER) 100 MG capsule Take 350 mg by mouth daily.     Marland Kitchen MAGNESIUM PO Take 1 tablet by mouth daily.    . Multiple Vitamin (MULTIVITAMIN) tablet Take 1 tablet by mouth daily.    . nitroGLYCERIN (NITROSTAT) 0.4 MG SL tablet Place 1 tablet (0.4  mg total) under the tongue every 5 (five) minutes as needed for chest pain. 30 tablet prn  . rosuvastatin (CRESTOR) 20 MG tablet Take 1 tablet (20 mg total) by mouth daily. 90 tablet 3  . zaleplon (SONATA) 10 MG capsule Take 1 capsule (10 mg total) by mouth at bedtime as needed for sleep. 30 capsule 0  . furosemide (LASIX) 20 MG tablet Take 1 tablet (20 mg total) by mouth daily. 90 tablet 3   No facility-administered medications prior to visit.      Per HPI unless specifically indicated in ROS section below Review of Systems Objective:    BP 132/80 (BP Location: Left Arm, Patient Position: Sitting, Cuff Size: Normal)   Pulse 72   Temp 98.4 F (36.9 C) (Oral)   Ht 6' 0.5" (1.842 m)   Wt 209 lb 12 oz (95.1 kg)   SpO2 92%   BMI 28.06 kg/m   Wt Readings from Last 3 Encounters:  09/16/18 209 lb 12 oz (95.1 kg)  08/26/18 209 lb 12.8 oz (95.2 kg)  08/01/18 209 lb 8 oz (95 kg)    Physical Exam Vitals signs and nursing note reviewed.  Constitutional:      General: He is not in acute distress.    Appearance: Normal appearance. He is well-developed. He is not ill-appearing.  HENT:  Head: Normocephalic and atraumatic.     Right Ear: Tympanic membrane, ear canal and external ear normal. Decreased hearing noted.     Left Ear: Tympanic membrane, ear canal and external ear normal. Decreased hearing noted.     Ears:     Comments: Hearing aides in place    Nose: Congestion (erythematous congested mucosa) present. No mucosal edema or rhinorrhea.     Right Sinus: No maxillary sinus tenderness or frontal sinus tenderness.     Left Sinus: No maxillary sinus tenderness or frontal sinus tenderness.     Mouth/Throat:     Mouth: Mucous membranes are moist.     Pharynx: Oropharynx is clear. Uvula midline. Posterior oropharyngeal erythema present. No oropharyngeal exudate.     Tonsils: No tonsillar abscesses.  Eyes:     General: No scleral icterus.    Conjunctiva/sclera: Conjunctivae normal.      Pupils: Pupils are equal, round, and reactive to light.  Neck:     Musculoskeletal: Normal range of motion and neck supple.     Comments: Chronic woody neck consistency after radiation Cardiovascular:     Rate and Rhythm: Normal rate and regular rhythm.     Pulses: Normal pulses.     Heart sounds: Normal heart sounds. No murmur.  Pulmonary:     Effort: Pulmonary effort is normal. No respiratory distress.     Breath sounds: No decreased air movement. Examination of the left-lower field reveals decreased breath sounds. Decreased breath sounds, rhonchi (LLL) and rales (mild LLL) present. No wheezing.  Lymphadenopathy:     Cervical: No cervical adenopathy.  Skin:    General: Skin is warm and dry.     Findings: No rash.  Neurological:     Mental Status: He is alert.       Assessment & Plan:   Problem List Items Addressed This Visit    Acute sinusitis - Primary    Treat for bacterial cause given progression and duration of symptoms. Treat with augmentin 10 d course. Supportive care as per instructions. Update if not improving with treatment.  CXR checked due to LLL rhonchi - clear on my read.       Relevant Medications   amoxicillin-clavulanate (AUGMENTIN) 875-125 MG tablet    Other Visit Diagnoses    Cough       Relevant Orders   DG Chest 2 View       Meds ordered this encounter  Medications  . amoxicillin-clavulanate (AUGMENTIN) 875-125 MG tablet    Sig: Take 1 tablet by mouth 2 (two) times daily for 10 days.    Dispense:  20 tablet    Refill:  0   Orders Placed This Encounter  Procedures  . DG Chest 2 View    Standing Status:   Future    Number of Occurrences:   1    Standing Expiration Date:   11/15/2019    Order Specific Question:   Reason for Exam (SYMPTOM  OR DIAGNOSIS REQUIRED)    Answer:   cough for weeks, LLL rales and rhonchi    Order Specific Question:   Preferred imaging location?    Answer:   [facility]    Order Specific Question:   Radiology  Contrast Protocol - do NOT remove file path    Answer:   [email or web address]    Follow up plan: No follow-ups on file.  [name], MD

## 2018-09-16 NOTE — Assessment & Plan Note (Signed)
Treat for bacterial cause given progression and duration of symptoms. Treat with augmentin 10 d course. Supportive care as per instructions. Update if not improving with treatment.  CXR checked due to LLL rhonchi - clear on my read.

## 2018-09-16 NOTE — Patient Instructions (Addendum)
Chest xray today - looked ok. You have a sinus infection. Take medicine as prescribed: augmentin 10 day course.  Push fluids and plenty of rest. Nasal saline irrigation or neti pot to help drain sinuses. May use plain mucinex with plenty of fluid to help mobilize mucous. Please let us know if fever >101.5, trouble opening/closing mouth, difficulty swallowing, or worsening instead of improving as expected.

## 2018-09-17 ENCOUNTER — Ambulatory Visit: Payer: PPO

## 2018-09-17 DIAGNOSIS — R351 Nocturia: Secondary | ICD-10-CM

## 2018-09-17 NOTE — Progress Notes (Signed)
PTNS  Session # 4  Health & Social Factors: same Caffeine: 0 Alcohol: 0 Daytime voids #per day: pt unable to answer- states he only keeps track at night Night-time voids #per night: 3-4 Urgency: strone Incontinence Episodes #per day: 2-3 Ankle used: left Treatment Setting: 8 Feeling/ Response: sensory-foot Comments: patient tolerated well.  New voiding diary given to pt.  Preformed By: Shawnie Dapper, CMA   Assistant: Fonnie Jarvis, CMA  Follow Up: as scheduled

## 2018-09-24 ENCOUNTER — Ambulatory Visit (INDEPENDENT_AMBULATORY_CARE_PROVIDER_SITE_OTHER): Payer: PPO

## 2018-09-24 DIAGNOSIS — R351 Nocturia: Secondary | ICD-10-CM | POA: Diagnosis not present

## 2018-09-24 NOTE — Progress Notes (Signed)
Follow-up Outpatient Visit Date: 09/25/2018  Primary Care Provider: [name], MD [address] [place] [address]  Chief Complaint: Sinus congestion  HPI:  Mr. Gassett is a 78 y.o. year-old male with history of coronary artery disease status post CABG in 09/7076, chronic systolic heart failure secondary to ischemic cardiomyopathy, hypertension, hyperlipidemia, who presents for follow-up of coronary artery disease and cardiomyopathy.  I last saw Mr. Minotti in October, at which time he was doing well.  However, he made note of occasional high blood pressures with systolic readings up to 675 mmHg.  We therefore agreed to begin amlodipine 2.5 mg daily.  He subsequently elected to stop carvedilol on his own due to bradycardia.  Due to elevated blood pressures, we sequentially increased amlodipine to 10 mg daily.  Today, Mr. Brigham reports that he has been bothered by nasal/sinus/ear congestion since around Christmas, as well as a non-productive cough.  CXR by his PCP last month did not show any acute abnormality.  He is completing a course of Augmentin, but feels like his symptoms have not improved much.  Due to these symptoms, he has not felt like exercising over the last 2 weeks.  Mr. Fedora has otherwise felt well, denying chest pain, shortness of breath, palpitations, and lightheadedness.  He has noted occasional ankle swelling when standing for prolonged periods.  Home BP has been from the 110's-150'/60'-80's.  HR is generally in the 60's or 70's since discontinuation of carvedilol.  He does not recall any particular side effects while on carvedilol.  --------------------------------------------------------------------------------------------------  Past Medical History:  Diagnosis Date  . Anxiety   . Atrial flutter ([place]) 01/07/2018   Post-op after CABG  . Cancer ([place])   . Coronary artery disease 2003   a. nonobstructive CAD by cath in 2003 and 2005; b.  Status post  three-vessel CABG on 12/12/2017 with LIMA to LAD, sequential reverse SVG to OM1 and distal LCx  . Hearing loss    bilateral  . Heart disease   . Heart murmur   . History of benign prostatic hypertrophy   . History of radiation therapy 01/31/10-03/22/10   right tonsil/right neck node 7000 cGy 35 sessions, high risk lymph node volume 5940 cGy 35 sessions, low risk lymph node vol 5600 cGy 35 sessions  . 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 HTN (hypertension)   . Hyperlipidemia    diet controlled- taking medication d/t ensure drinking  . Hypothyroid 01/16/2013  . Ischemic cardiomyopathy    a. 10/2017: echo showing reduced EF of 40-45%, HK of the anterior, anteroseptal and apical myocardium with Grade 1 DD.   Marland Kitchen Neck pain   . Squamous cell carcinoma    right tonsil- 2011  . Thrombocytopenia ([place]) 02/02/2012   unclear etiology  . Tinnitus    Past Surgical History:  Procedure Laterality Date  . artoscopic knee    . CARDIAC CATHETERIZATION  2003, 2005   40% blockage, two 30% blockages treated medically [place])  . CARDIOVERSION N/A 02/12/2018   Procedure: CARDIOVERSION;  Surgeon: [name], MD;  Location: [facility];  Service: Cardiovascular;  Laterality: N/A;  . COLONOSCOPY  03/2015   TA x3, HP, melanosis coli, severe diverticulosis, rpt 3 yrs ([place])  . CORONARY ARTERY BYPASS GRAFT N/A 12/12/2017   Procedure: CORONARY ARTERY BYPASS GRAFTING (CABG) x3 using the right greater saphenous vein harvested endoscopically and the left internal mammary artery. LIMA to LAD, SEQ SVG to [place];  Surgeon: Lanelle Bal  B, MD;  Location: [place];  Service: Open Heart Surgery;  Laterality: N/A;  . DENTAL SURGERY     extractions  . IR THORACENTESIS ASP PLEURAL SPACE W/IMG GUIDE  01/17/2018  . KNEE ARTHROSCOPY Left   . LEFT HEART CATH AND CORONARY ANGIOGRAPHY N/A 12/10/2017   Procedure: LEFT HEART CATH AND CORONARY ANGIOGRAPHY;  Surgeon: [name], MD;  Location: [facility];  Service:  Cardiovascular;  Laterality: N/A;  . PEG PLACEMENT  02/2010  . TEE WITHOUT CARDIOVERSION N/A 12/12/2017   Procedure: TRANSESOPHAGEAL ECHOCARDIOGRAM (TEE);  Surgeon: [name], MD;  Location: [facility];  Service: Open Heart Surgery;  Laterality: N/A;  . TONSILLECTOMY    . triple bypass    . VASECTOMY      Current Meds  Medication Sig  . amLODipine (NORVASC) 10 MG tablet Take 1 tablet (10 mg total) by mouth daily.  Marland Kitchen amoxicillin-clavulanate (AUGMENTIN) 875-125 MG tablet Take 1 tablet by mouth 2 (two) times daily for 10 days.  . Ascorbic Acid (VITAMIN C PO) Take by mouth daily.  Marland Kitchen aspirin EC 81 MG tablet Take 1 tablet (81 mg total) by mouth daily.  Mariane Baumgarten Sodium (STOOL SOFTENER) 100 MG capsule Take 350 mg by mouth daily.   . furosemide (LASIX) 20 MG tablet Take 1 tablet (20 mg total) by mouth daily.  Marland Kitchen MAGNESIUM PO Take 1 tablet by mouth daily.  . Multiple Vitamin (MULTIVITAMIN) tablet Take 1 tablet by mouth daily.  . nitroGLYCERIN (NITROSTAT) 0.4 MG SL tablet Place 1 tablet (0.4 mg total) under the tongue every 5 (five) minutes as needed for chest pain.  . rosuvastatin (CRESTOR) 20 MG tablet Take 1 tablet (20 mg total) by mouth daily.  . zaleplon (SONATA) 10 MG capsule Take 1 capsule (10 mg total) by mouth at bedtime as needed for sleep.    Allergies: Ace inhibitors and Morphine and related  Social History   Tobacco Use  . Smoking status: Never Smoker  . Smokeless tobacco: Never Used  Substance Use Topics  . Alcohol use: Yes    Comment: 6 glasses of wine per year  . Drug use: No    Family History  Problem Relation Age of Onset  . Coronary artery disease Father   . Heart attack Father 92  . Heart disease Father   . Stroke Mother   . Colon cancer Neg Hx   . Esophageal cancer Neg Hx   . Rectal cancer Neg Hx   . Stomach cancer Neg Hx   . Kidney cancer Neg Hx   . Kidney failure Neg Hx   . Prostate cancer Neg Hx   . Tuberculosis Neg Hx     Review of Systems: A  12-system review of systems was performed and was negative except as noted in the HPI.  --------------------------------------------------------------------------------------------------  Physical Exam: BP 134/70 (BP Location: Left Arm, Patient Position: Sitting, Cuff Size: Normal)   Pulse 76   Ht 6\' 2"  (1.88 m)   Wt 206 lb (93.4 kg)   BMI 26.45 kg/m   General:  NAD HEENT: No conjunctival pallor or scleral icterus. Moist mucous membranes.  OP clear. Neck: Supple without lymphadenopathy, thyromegaly, JVD, or HJR. Lungs: Normal work of breathing. Clear to auscultation bilaterally without wheezes or crackles. Heart: Regular rate and rhythm without murmurs, rubs, or gallops. Non-displaced PMI. Abd: Bowel sounds present. Soft, NT/ND without hepatosplenomegaly Ext: Trace pretibial edema. 2+ radial pulses. Skin: Warm and dry without rash.  EKG:  NSR with  LAD, LVH and abnormal repolarization.  Compared with with prior tracing from 06/19/2018, HR has increased and PVC's are no longer present.  Lab Results  Component Value Date   WBC 4.1 08/19/2018   HGB 17.8 (H) 08/19/2018   HCT 51.8 08/19/2018   MCV 97.3 08/19/2018   PLT 133.0 (L) 08/19/2018    Lab Results  Component Value Date   NA 140 08/19/2018   K 4.0 08/19/2018   CL 106 08/19/2018   CO2 28 08/19/2018   BUN 15 08/19/2018   CREATININE 0.78 08/19/2018   GLUCOSE 129 (H) 08/19/2018   ALT 16 03/20/2018    Lab Results  Component Value Date   CHOL 119 03/20/2018   HDL 37 (L) 03/20/2018   LDLCALC 49 03/20/2018   LDLDIRECT 101.0 10/13/2014   TRIG 163 (H) 03/20/2018   CHOLHDL 3.2 03/20/2018    --------------------------------------------------------------------------------------------------  ASSESSMENT AND PLAN: Coronary artery disease No symptoms of worsening coronary insufficiency.  I have a low suspicion that cough and sinus/nasal symptoms are cardiac in origin.  We will restart carvedilol and otherwise continue current  medications.  Ischemic cardiomyopathy and chronic systolic heart failure Trace ankle edema noted, which is most likely due to high-dose amlodipine.  Per the patient, the is mainly dependent.  He otherwise appears euvolemic with stable weight.  He does not have signs or symptoms or decompensated HF.  We will restart carvedilol, as above.  No other medication changes at this time.  Hypertension BP variable at home and upper normal today.  We will restart carvedilol 3.125 mg BID.  Continue current dose of amlodipine, though if edema worsens or BP becomes low, we may need to deescalate this.  Hyperlipidemia LDL at goal; continue rosuvastatin.  Sinus/nasal/ear congestion Does not appear to be improving with Augmentin.  Given history of tonsillar cancer, I advised Mr. Lundberg to contact his ENT provider if symptoms do not improve over the next week or two.  Follow-up: Return to clinic in 3 months.  [name], MD 09/25/2018 2:32 PM

## 2018-09-24 NOTE — Progress Notes (Signed)
PTNS  Session # 5  Health & Social Factors: Patient is currently being treated for a sinus infection on antibiotics  Caffeine: 1 daily Alcohol: None Daytime voids #per day: 8 Night-time voids #per night: 2-3 Urgency: Strong Incontinence Episodes #per day: 1-2 Ankle used: Right Treatment Setting: 15 Feeling/ Response: Sensory Comments: Patient given new voiding diary   Preformed By: Gordy Clement, [place] ([place])  Assistant: N/A  Follow Up: As Scheduled

## 2018-09-25 ENCOUNTER — Ambulatory Visit (INDEPENDENT_AMBULATORY_CARE_PROVIDER_SITE_OTHER): Payer: PPO | Admitting: Internal Medicine

## 2018-09-25 ENCOUNTER — Encounter: Payer: Self-pay | Admitting: Internal Medicine

## 2018-09-25 VITALS — BP 134/70 | HR 76 | Ht 74.0 in | Wt 206.0 lb

## 2018-09-25 DIAGNOSIS — I255 Ischemic cardiomyopathy: Secondary | ICD-10-CM | POA: Diagnosis not present

## 2018-09-25 DIAGNOSIS — I10 Essential (primary) hypertension: Secondary | ICD-10-CM

## 2018-09-25 DIAGNOSIS — R0981 Nasal congestion: Secondary | ICD-10-CM

## 2018-09-25 DIAGNOSIS — I5022 Chronic systolic (congestive) heart failure: Secondary | ICD-10-CM

## 2018-09-25 DIAGNOSIS — I2510 Atherosclerotic heart disease of native coronary artery without angina pectoris: Secondary | ICD-10-CM

## 2018-09-25 MED ORDER — CARVEDILOL 3.125 MG PO TABS: 3.1250 mg | ORAL_TABLET | Freq: Two times a day (BID) | ORAL | 3 refills | Status: DC

## 2018-09-25 NOTE — Patient Instructions (Signed)
Medication Instructions:  Your physician has recommended you make the following change in your medication:  1- START Carvedilol 3.125 mg  (1 tablet) by mouth two times a day.  If you need a refill on your cardiac medications before your next appointment, please call your pharmacy.   Lab work: none If you have labs (blood work) drawn today and your tests are completely normal, you will receive your results only by: Marland Kitchen MyChart Message (if you have MyChart) OR . A paper copy in the mail If you have any lab test that is abnormal or we need to change your treatment, we will call you to review the results.  Testing/Procedures: none  Follow-Up: At [facility], you and your health needs are our priority.  As part of our continuing mission to provide you with exceptional heart care, we have created designated Provider Care Teams.  These Care Teams include your primary Cardiologist (physician) and Advanced Practice Providers (APPs -  Physician Assistants and Nurse Practitioners) who all work together to provide you with the care you need, when you need it. You will need a follow up appointment in 3 months.  Please call our office 2 months in advance to schedule this appointment.  You may see [name], MD or one of the following Advanced Practice Providers on your designated Care Team:   Murray Hodgkins, NP Christell Faith, PA-C . Marrianne Mood, PA-C

## 2018-09-26 ENCOUNTER — Telehealth: Payer: Self-pay | Admitting: *Deleted

## 2018-09-26 NOTE — Telephone Encounter (Signed)
Patient came by office after going to cardiac rehab for exercise.  He wanted to make sure the adding of carvedilol yesterday at appointment was in addition to his other medications. Advised that yes he should start carvedilol and continue taking his other medications. 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

## 2018-09-30 ENCOUNTER — Ambulatory Visit: Payer: PPO | Admitting: Urology

## 2018-10-01 ENCOUNTER — Ambulatory Visit (INDEPENDENT_AMBULATORY_CARE_PROVIDER_SITE_OTHER): Payer: PPO

## 2018-10-01 DIAGNOSIS — J018 Other acute sinusitis: Secondary | ICD-10-CM | POA: Diagnosis not present

## 2018-10-01 DIAGNOSIS — R351 Nocturia: Secondary | ICD-10-CM | POA: Diagnosis not present

## 2018-10-01 DIAGNOSIS — H6521 Chronic serous otitis media, right ear: Secondary | ICD-10-CM | POA: Diagnosis not present

## 2018-10-01 NOTE — Progress Notes (Signed)
PTNS  Session # 7  Health & Social Factors: Being treated for sinus infection Caffeine: 1 Alcohol: N/A Daytime voids #per day: 8 Night-time voids #per night: 2-3 Urgency: Strong Incontinence Episodes #per day: 1-2 Ankle used: Left Treatment Setting: 12 Feeling/ Response: Sensory Comments: Patient forgot voiding diary   Preformed By: Gordy Clement, CMA (AAMA)   Follow Up: RTC as scheduled

## 2018-10-02 ENCOUNTER — Ambulatory Visit: Payer: Self-pay | Admitting: Internal Medicine

## 2018-10-07 ENCOUNTER — Encounter: Payer: Self-pay | Admitting: *Deleted

## 2018-10-08 ENCOUNTER — Ambulatory Visit: Payer: PPO

## 2018-10-09 ENCOUNTER — Ambulatory Visit (INDEPENDENT_AMBULATORY_CARE_PROVIDER_SITE_OTHER): Payer: PPO

## 2018-10-09 DIAGNOSIS — N3281 Overactive bladder: Secondary | ICD-10-CM

## 2018-10-09 NOTE — Progress Notes (Signed)
  Session # 8  Health & Social Factors: Being treated for sinus infection Caffeine: 1 Alcohol: N/A Daytime voids #per day: 8 Night-time voids #per night: 2-3 Urgency: Strong Incontinence Episodes #per day: 1-2 Ankle used: right  Treatment Setting: 16 Feeling/ Response: Sensory Comments: Patient forgot voiding diary   Preformed By: Janett Billow  CMA (AAMA)  And Clarise Cruz CMA   Follow Up: RTC as scheduled

## 2018-10-15 ENCOUNTER — Ambulatory Visit (INDEPENDENT_AMBULATORY_CARE_PROVIDER_SITE_OTHER): Payer: PPO | Admitting: Urology

## 2018-10-15 DIAGNOSIS — N3281 Overactive bladder: Secondary | ICD-10-CM | POA: Diagnosis not present

## 2018-10-15 NOTE — Progress Notes (Signed)
PTNS  Session # 8  Health & Social Factors: Patient has completed course of treatment for sinus infection. Caffeine: 0 Alcohol: 0 Daytime voids #per day: 9 Night-time voids #per night: 4 Urgency: Strong Incontinence Episodes #per day: 2 Ankle used: left Treatment Setting: 10 Feeling/ Response: sensory foot Comments: patient tolerated well  Preformed By: Shawnie Dapper, CMA   Follow Up: as scheduled

## 2018-10-16 DIAGNOSIS — H6521 Chronic serous otitis media, right ear: Secondary | ICD-10-CM | POA: Diagnosis not present

## 2018-10-16 DIAGNOSIS — J329 Chronic sinusitis, unspecified: Secondary | ICD-10-CM | POA: Diagnosis not present

## 2018-10-18 ENCOUNTER — Encounter: Payer: Self-pay | Admitting: Internal Medicine

## 2018-10-18 ENCOUNTER — Ambulatory Visit (INDEPENDENT_AMBULATORY_CARE_PROVIDER_SITE_OTHER): Payer: PPO | Admitting: Internal Medicine

## 2018-10-18 VITALS — BP 140/76 | HR 68 | Ht 74.0 in | Wt 205.0 lb

## 2018-10-18 DIAGNOSIS — Z860100 Personal history of colon polyps, unspecified: Secondary | ICD-10-CM

## 2018-10-18 MED ORDER — SUPREP BOWEL PREP KIT 17.5-3.13-1.6 GM/177ML PO SOLN: 1.0000 | ORAL | 0 refills | Status: DC

## 2018-10-18 NOTE — Patient Instructions (Signed)
You have been scheduled for a colonoscopy. Please follow written instructions given to you at your visit today.  Please pick up your prep supplies at the pharmacy within the next 1-3 days. If you use inhalers (even only as needed), please bring them with you on the day of your procedure. Your physician has requested that you go to www.startemmi.com and enter the access code given to you at your visit today. This web site gives a general overview about your procedure. However, you should still follow specific instructions given to you by our office regarding your preparation for the procedure.  If you are age 75 or older, your body mass index should be between 23-30. Your Body mass index is 26.32 kg/m. If this is out of the aforementioned range listed, please consider follow up with your Primary Care Provider.  If you are age 10 or younger, your body mass index should be between 19-25. Your Body mass index is 26.32 kg/m. If this is out of the aformentioned range listed, please consider follow up with your Primary Care Provider.

## 2018-10-18 NOTE — Progress Notes (Signed)
Patient ID: Phillip [name], male   DOB: [birth year], 78 y.o.   MRN: [id number] HPI: Phillip [name] is a 78 year old male with a past medical history of adenomatous colon polyps, colonic diverticulosis, coronary artery disease status post CABG in April 2019, ischemic cardiomyopathy, hypertension, hyperlipidemia, history of squamous cell cancer of the tonsil who is seen to discuss surveillance colonoscopy.  He is here alone today.  His last colonoscopy was performed by me on 04/08/2015.  This revealed 5 polyps ranging in size from 3 to 5 mm.  These were removed.  There was severe diverticulosis in the left colon.  3 of these polyps were adenomatous.  He reports that he is feeling well.  He denies GI complaint including diarrhea, constipation, abdominal pain, change in bowel habit, blood in his stool and melena.  Good appetite.  No upper GI or hepatobiliary complaint.  He has been dealing with a sinusitis type infection and is currently taking Augmentin.  He sees Dr. Lucia Gaskins of ENT and has been working with his primary care provider Dr. Danise Mina.  He is no longer on Eliquis though did take this previously.  He does take a daily aspirin.  Today he denies shortness of breath, chest pain.  He remains very active both personally and in the community.  Past Medical History:  Diagnosis Date  . Anxiety   . Atrial flutter ([place]) 01/07/2018   Post-op after CABG  . Cancer ([place])   . Coronary artery disease 2003   a. nonobstructive CAD by cath in 2003 and 2005; b.  Status post three-vessel CABG on 12/12/2017 with LIMA to LAD, sequential reverse SVG to OM1 and distal LCx  . Diverticulosis   . Hearing loss    bilateral  . Heart disease   . Heart murmur   . History of benign prostatic hypertrophy   . History of radiation therapy 01/31/10-03/22/10   right tonsil/right neck node 7000 cGy 35 sessions, high risk lymph node volume 5940 cGy 35 sessions, low risk lymph node vol 5600 cGy 35 sessions  . HPV in male    positive   . HTN (hypertension)   . Hyperlipidemia    diet controlled- taking medication d/t ensure drinking  . Hypothyroid 01/16/2013  . Ischemic cardiomyopathy    a. 10/2017: echo showing reduced EF of 40-45%, HK of the anterior, anteroseptal and apical myocardium with Grade 1 DD.   Marland Kitchen Neck pain   . Squamous cell carcinoma    right tonsil- 2011  . Thrombocytopenia ([place]) 02/02/2012   unclear etiology  . Tinnitus   . Tubular adenoma of colon     Past Surgical History:  Procedure Laterality Date  . artoscopic knee    . CARDIAC CATHETERIZATION  2003, 2005   40% blockage, two 30% blockages treated medically Ron Parker)  . CARDIOVERSION N/A 02/12/2018   Procedure: CARDIOVERSION;  Surgeon: [name], MD;  Location: [facility];  Service: Cardiovascular;  Laterality: N/A;  . COLONOSCOPY  03/2015   TA x3, HP, melanosis coli, severe diverticulosis, rpt 3 yrs ([facility]arien Rodkey)  . CORONARY ARTERY BYPASS GRAFT N/A 12/12/2017   Procedure: CORONARY ARTERY BYPASS GRAFTING (CABG) x3 using the right greater saphenous vein harvested endoscopically and the left internal mammary artery. LIMA to LAD, SEQ SVG to OM1 & OM2;  Surgeon: [name], MD;  Location: [place];  Service: Open Heart Surgery;  Laterality: N/A;  . DENTAL SURGERY     extractions  . IR THORACENTESIS ASP PLEURAL SPACE W/IMG GUIDE  01/17/2018  .  KNEE ARTHROSCOPY Left   . LEFT HEART CATH AND CORONARY ANGIOGRAPHY N/A 12/10/2017   Procedure: LEFT HEART CATH AND CORONARY ANGIOGRAPHY;  Surgeon: [name], MD;  Location: [place] [facility];  Service: Cardiovascular;  Laterality: N/A;  . PEG PLACEMENT  02/2010  . TEE WITHOUT CARDIOVERSION N/A 12/12/2017   Procedure: TRANSESOPHAGEAL ECHOCARDIOGRAM (TEE);  Surgeon: [name], MD;  Location: [place];  Service: Open Heart Surgery;  Laterality: N/A;  . TONSILLECTOMY    . triple bypass  11/2017  . VASECTOMY      Outpatient Medications Prior to Visit  Medication Sig Dispense Refill  . amLODipine  (NORVASC) 10 MG tablet Take 1 tablet (10 mg total) by mouth daily. 90 tablet 3  . amoxicillin-clavulanate (AUGMENTIN) 875-125 MG tablet Take 1 tablet by mouth 2 (two) times daily.    . Ascorbic Acid (VITAMIN C PO) Take by mouth daily.    Marland Kitchen aspirin EC 81 MG tablet Take 1 tablet (81 mg total) by mouth daily. 90 tablet 3  . carvedilol (COREG) 3.125 MG tablet Take 1 tablet (3.125 mg total) by mouth 2 (two) times daily. 180 tablet 3  . Docusate Sodium (STOOL SOFTENER) 100 MG capsule Take 350 mg by mouth daily.     Marland Kitchen MAGNESIUM PO Take 1 tablet by mouth daily.    . Multiple Vitamin (MULTIVITAMIN) tablet Take 1 tablet by mouth daily.    . nitroGLYCERIN (NITROSTAT) 0.4 MG SL tablet Place 1 tablet (0.4 mg total) under the tongue every 5 (five) minutes as needed for chest pain. 30 tablet prn  . predniSONE (STERAPRED UNI-PAK 21 TAB) 10 MG (21) TBPK tablet Take 1 tablet by mouth as directed.    . pseudoephedrine (SUDAFED) 30 MG tablet Take 30 mg by mouth every 4 (four) hours as needed for congestion.    . rosuvastatin (CRESTOR) 20 MG tablet Take 1 tablet (20 mg total) by mouth daily. 90 tablet 3  . zaleplon (SONATA) 10 MG capsule Take 1 capsule (10 mg total) by mouth at bedtime as needed for sleep. 30 capsule 0  . furosemide (LASIX) 20 MG tablet Take 1 tablet (20 mg total) by mouth daily. 90 tablet 3   No facility-administered medications prior to visit.     Allergies  Allergen Reactions  . Ace Inhibitors Swelling and Other (See Comments)    Possible angioedema (upper lip swelling)  . Morphine And Related Swelling    SWELLING REACTION UNSPECIFIED  [severity rated per PMH, 12/11/2017]    Family History  Problem Relation Age of Onset  . Coronary artery disease Father   . Heart attack Father 83  . Heart disease Father   . Stroke Mother   . Colon cancer Neg Hx   . Esophageal cancer Neg Hx   . Rectal cancer Neg Hx   . Stomach cancer Neg Hx   . Kidney cancer Neg Hx   . Kidney failure Neg Hx   .  Prostate cancer Neg Hx   . Tuberculosis Neg Hx     Social History   Tobacco Use  . Smoking status: Never Smoker  . Smokeless tobacco: Never Used  Substance Use Topics  . Alcohol use: Yes    Comment: 6 glasses of wine per year  . Drug use: No    ROS: As per history of present illness, otherwise negative  BP 140/76   Pulse 68   Ht 6\' 2"  (1.88 m)   Wt 205 lb (93 kg)   BMI 26.32  kg/m  Gen: awake, alert, NAD HEENT: anicteric, op clear CV: RRR, no mrg Pulm: CTA b/l Abd: soft, NT/ND, +BS throughout Ext: no c/c/e Neuro: nonfocal   RELEVANT LABS AND IMAGING: CBC    Component Value Date/Time   WBC 4.1 08/19/2018 0918   RBC 5.33 08/19/2018 0918   HGB 17.8 (H) 08/19/2018 0918   HGB 16.0 03/20/2018 0947   HGB 16.7 12/29/2013 1351   HCT 51.8 08/19/2018 0918   HCT 49.3 03/20/2018 0947   HCT 49.2 12/29/2013 1351   PLT 133.0 (L) 08/19/2018 0918   PLT 144 (L) 03/20/2018 0947   MCV 97.3 08/19/2018 0918   MCV 91 03/20/2018 0947   MCV 97.1 12/29/2013 1351   MCH 29.4 03/20/2018 0947   MCH 31.9 12/17/2017 0233   MCHC 34.4 08/19/2018 0918   RDW 12.6 08/19/2018 0918   RDW 15.8 (H) 03/20/2018 0947   RDW 12.0 12/29/2013 1351   LYMPHSABS 0.6 (L) 08/19/2018 0918   LYMPHSABS 0.5 (L) 03/20/2018 0947   LYMPHSABS 0.4 (L) 12/29/2013 1351   MONOABS 0.4 08/19/2018 0918   MONOABS 0.5 12/29/2013 1351   EOSABS 0.1 08/19/2018 0918   EOSABS 0.1 03/20/2018 0947   BASOSABS 0.0 08/19/2018 0918   BASOSABS 0.0 03/20/2018 0947   BASOSABS 0.0 12/29/2013 1351    CMP     Component Value Date/Time   NA 140 08/19/2018 0918   NA 141 07/10/2018 1110   NA 141 12/29/2013 1351   K 4.0 08/19/2018 0918   K 4.2 12/29/2013 1351   CL 106 08/19/2018 0918   CL 106 01/16/2013 0938   CO2 28 08/19/2018 0918   CO2 23 12/29/2013 1351   GLUCOSE 129 (H) 08/19/2018 0918   GLUCOSE 125 12/29/2013 1351   GLUCOSE 108 (H) 01/16/2013 0938   BUN 15 08/19/2018 0918   BUN 18 07/10/2018 1110   BUN 17.2 12/29/2013  1351   CREATININE 0.78 08/19/2018 0918   CREATININE 0.9 12/29/2013 1351   CALCIUM 9.6 08/19/2018 0918   CALCIUM 9.9 12/29/2013 1351   PROT 6.7 03/20/2018 0947   PROT 7.1 12/29/2013 1351   ALBUMIN 4.4 03/20/2018 0947   ALBUMIN 4.0 12/29/2013 1351   AST 25 03/20/2018 0947   AST 18 12/29/2013 1351   ALT 16 03/20/2018 0947   ALT 21 12/29/2013 1351   ALKPHOS 72 03/20/2018 0947   ALKPHOS 74 12/29/2013 1351   BILITOT 0.5 03/20/2018 0947   BILITOT 0.30 12/29/2013 1351   GFRNONAA 84 07/10/2018 1110   GFRAA 97 07/10/2018 1110    ASSESSMENT/PLAN:  78 year old male with a past medical history of adenomatous colon polyps, colonic diverticulosis, coronary artery disease status post CABG in April 2019, ischemic cardiomyopathy, hypertension, hyperlipidemia, history of squamous cell cancer of the tonsil who is seen to discuss surveillance colonoscopy  1.  Personal history of adenomatous colon polyps --surveillance colonoscopy is indicated at this time.  We discussed the risk, benefits and alternatives to repeat colonoscopy but also discussed discontinuation of surveillance.  After this discussion he wishes to pursue surveillance which I feel is very reasonable given his overall health.  We will schedule this test for him.  2.  History of CAD and CHF --at last echocardiogram his EF had improved to 45 to 50%.     ZW:CHEN[facility]PO[facility], [place], [address] [place], [place] [address]

## 2018-10-22 ENCOUNTER — Ambulatory Visit (INDEPENDENT_AMBULATORY_CARE_PROVIDER_SITE_OTHER): Payer: PPO

## 2018-10-22 DIAGNOSIS — R351 Nocturia: Secondary | ICD-10-CM | POA: Diagnosis not present

## 2018-10-22 NOTE — Progress Notes (Signed)
PTNS  Session # 9  Health & Social Factors: same Caffeine: 0 Alcohol: 0 Daytime voids #per day:  * see below Night-time voids #per night:  * Urgency: * Incontinence Episodes #per day:  * Ankle used: right Treatment Setting: 10 Feeling/ Response: sensory foot Comments: Patient tolerated well. Patient states he does not remember with his voiding "averages" and did not bring his diary as he states he was never instructed to do so but he will bring his diary next time so we can "update our records."  Preformed By:  Shawnie Dapper, CMA  Follow Up: as scheduled.

## 2018-10-29 ENCOUNTER — Ambulatory Visit: Payer: PPO

## 2018-11-01 ENCOUNTER — Other Ambulatory Visit: Payer: Self-pay | Admitting: Family Medicine

## 2018-11-05 ENCOUNTER — Other Ambulatory Visit: Payer: Self-pay

## 2018-11-05 ENCOUNTER — Ambulatory Visit (INDEPENDENT_AMBULATORY_CARE_PROVIDER_SITE_OTHER): Payer: PPO

## 2018-11-05 DIAGNOSIS — R3981 Functional urinary incontinence: Secondary | ICD-10-CM | POA: Diagnosis not present

## 2018-11-05 DIAGNOSIS — R351 Nocturia: Secondary | ICD-10-CM

## 2018-11-05 NOTE — Progress Notes (Signed)
PTNS  Session # 10  Health & Social Factors: No Change Caffeine: 0 Alcohol: 0 Daytime voids #per day: 7-8 Night-time voids #per night: 4 Urgency: 0 Incontinence Episodes #per day: 1 Ankle used: Left Treatment Setting: 17 Feeling/ Response: Sensory Comments: Patient brought in averages today, will have scanned in  Preformed By: [place] (AAMA)  Follow Up: RTC as scheduled

## 2018-11-12 ENCOUNTER — Ambulatory Visit: Payer: PPO

## 2018-11-12 DIAGNOSIS — Z5111 Encounter for antineoplastic chemotherapy: Secondary | ICD-10-CM | POA: Diagnosis not present

## 2018-11-12 DIAGNOSIS — C099 Malignant neoplasm of tonsil, unspecified: Secondary | ICD-10-CM | POA: Diagnosis not present

## 2018-11-14 ENCOUNTER — Encounter: Payer: Self-pay | Admitting: Internal Medicine

## 2018-11-19 ENCOUNTER — Ambulatory Visit: Payer: PPO

## 2018-11-26 ENCOUNTER — Ambulatory Visit: Payer: Self-pay

## 2018-11-28 ENCOUNTER — Encounter: Payer: Self-pay | Admitting: Family Medicine

## 2018-12-03 ENCOUNTER — Other Ambulatory Visit: Payer: Self-pay

## 2018-12-03 ENCOUNTER — Other Ambulatory Visit: Payer: Self-pay | Admitting: *Deleted

## 2018-12-03 DIAGNOSIS — I483 Typical atrial flutter: Secondary | ICD-10-CM

## 2018-12-03 NOTE — Addendum Note (Signed)
Addended by: Valora Corporal on: 12/03/2018 04:39 PM   Modules accepted: Orders

## 2018-12-24 ENCOUNTER — Telehealth: Payer: Self-pay | Admitting: *Deleted

## 2018-12-24 NOTE — Progress Notes (Signed)
Virtual Visit via Telephone Note   This visit type was conducted due to national recommendations for restrictions regarding the COVID-19 Pandemic (e.g. social distancing) in an effort to limit this patient's exposure and mitigate transmission in our community.  Due to his co-morbid illnesses, this patient is at least at moderate risk for complications without adequate follow up.  This format is felt to be most appropriate for this patient at this time.  The patient did not have access to video technology/had technical difficulties with video requiring transitioning to audio format only (telephone).  All issues noted in this document were discussed and addressed.  No physical exam could be performed with this format.  Please refer to the patient's chart for his  consent to telehealth for [facility].   Date:  12/25/2018   ID:  Phillip [name], DOB 09/02/1940, MRN [id number]  Patient Location: Home Provider Location: Office  PCP:  [name], MD  Cardiologist:  [name], MD  Electrophysiologist:  None   Evaluation Performed:  Follow-Up Visit  Chief Complaint: Follow-up coronary artery disease and ischemic cardiomyopathy  History of Present Illness:    Phillip [name] is a 78 y.o. male with history of coronary artery disease status post CABG in 11/4008, chronic systolic heart failure secondary to ischemic cardiomyopathy, hypertension, and hyperlipidemia.  We are speaking today for follow-up of his coronary artery disease.  I last saw him in early February, at which time he was doing well other than nasal/sinus/ear congestion that began in 07/2018, as well as a nonproductive cough.  He had discontinued carvedilol out of concern that it was contributing to the aforementioned symptoms.  However, we agreed to restart carvedilol 3.125 mg twice daily.  Today, Mr. Leitz reports that he is doing fairly well.  Aforementioned nasal/sinus/ear congestion and nonproductive cough have resolved.  His  only complaint is of 2-3 episodes of lightheadedness since our last visit.  These occurred when standing and lasted 30 to 60 seconds.  He felt off balance but did not fall or pass out.  He try taking his blood pressure and heart rate during the episodes but did not notice any abnormal readings.  Home blood pressures have been running 118-140/61-81.  Pulse is typically 55-61.  Mr. Viglione has not had any medication side effects.  He also denies chest pain, shortness of breath, palpitations, and edema.  He is exercising regularly, though he feels like his stamina has decreased since being unable to [facility] in the setting of the COVID-19 pandemic.  The patient does not have symptoms concerning for COVID-19 infection (fever, chills, cough, or new shortness of breath).    Past Medical History:  Diagnosis Date   Anxiety    Atrial flutter ([place]) 01/07/2018   Post-op after CABG   Cancer [facility])    Coronary artery disease 2003   a. nonobstructive CAD by cath in 2003 and 2005; b.  Status post three-vessel CABG on 12/12/2017 with LIMA to LAD, sequential reverse SVG to OM1 and distal LCx   Diverticulosis    Hearing loss    bilateral   Heart disease    Heart murmur    History of benign prostatic hypertrophy    History of radiation therapy 01/31/10-03/22/10   right tonsil/right neck node 7000 cGy 35 sessions, high risk lymph node volume 5940 cGy 35 sessions, low risk lymph node vol 5600 cGy 35 sessions   HPV in male    positive   HTN (hypertension)  Hyperlipidemia    diet controlled- taking medication d/t ensure drinking   Hypothyroid 01/16/2013   Ischemic cardiomyopathy    a. 10/2017: echo showing reduced EF of 40-45%, HK of the anterior, anteroseptal and apical myocardium with Grade 1 DD.    Neck pain    Squamous cell carcinoma    right tonsil- 2011   Thrombocytopenia ([place]) 02/02/2012   unclear etiology   Tinnitus    Tubular adenoma of colon    Past Surgical  History:  Procedure Laterality Date   artoscopic knee     CARDIAC CATHETERIZATION  2003, 2005   40% blockage, two 30% blockages treated medically Ron Parker)   CARDIOVERSION N/A 02/12/2018   Procedure: CARDIOVERSION;  Surgeon: [name], MD;  Location: [facility];  Service: Cardiovascular;  Laterality: N/A;   COLONOSCOPY  03/2015   TA x3, HP, melanosis coli, severe diverticulosis, rpt 3 yrs ([place])   CORONARY ARTERY BYPASS GRAFT N/A 12/12/2017   Procedure: CORONARY ARTERY BYPASS GRAFTING (CABG) x3 using the right greater saphenous vein harvested endoscopically and the left internal mammary artery. LIMA to LAD, SEQ SVG to OM1 & OM2;  Surgeon: [name], MD;  Location: [place];  Service: Open Heart Surgery;  Laterality: N/A;   DENTAL SURGERY     extractions   IR THORACENTESIS ASP PLEURAL SPACE W/IMG GUIDE  01/17/2018   KNEE ARTHROSCOPY Left    LEFT HEART CATH AND CORONARY ANGIOGRAPHY N/A 12/10/2017   Procedure: LEFT HEART CATH AND CORONARY ANGIOGRAPHY;  Surgeon: [name], MD;  Location: [place] [facility];  Service: Cardiovascular;  Laterality: N/A;   PEG PLACEMENT  02/2010   TEE WITHOUT CARDIOVERSION N/A 12/12/2017   Procedure: TRANSESOPHAGEAL ECHOCARDIOGRAM (TEE);  Surgeon: [name], MD;  Location: [place];  Service: Open Heart Surgery;  Laterality: N/A;   TONSILLECTOMY     triple bypass  11/2017   VASECTOMY       Current Meds  Medication Sig   amLODipine (NORVASC) 10 MG tablet Take 1 tablet (10 mg total) by mouth daily.   Ascorbic Acid (VITAMIN C PO) Take by mouth 2 (two) times a day.    aspirin EC 81 MG tablet Take 1 tablet (81 mg total) by mouth daily.   carvedilol (COREG) 3.125 MG tablet Take 1 tablet (3.125 mg total) by mouth 2 (two) times daily.   Docusate Sodium (STOOL SOFTENER) 100 MG capsule Take 100 mg by mouth daily.    MAGNESIUM PO Take 1 tablet by mouth daily.   Multiple Vitamin (MULTIVITAMIN) tablet Take 1 tablet by mouth daily.     Multiple Vitamins-Minerals (ZINC PO) Take by mouth daily.   nitroGLYCERIN (NITROSTAT) 0.4 MG SL tablet Place 1 tablet (0.4 mg total) under the tongue every 5 (five) minutes as needed for chest pain.   Omega-3 Fatty Acids (OMEGA 3 PO) Take by mouth daily.   rosuvastatin (CRESTOR) 20 MG tablet Take 1 tablet (20 mg total) by mouth daily.   VITAMIN A PO Take by mouth daily.   vitamin E 400 UNIT capsule Take 400 Units by mouth daily.   zaleplon (SONATA) 10 MG capsule Take 1 capsule (10 mg total) by mouth at bedtime as needed for sleep.   [DISCONTINUED] carvedilol (COREG) 3.125 MG tablet Take 1 tablet (3.125 mg total) by mouth 2 (two) times daily.   [DISCONTINUED] furosemide (LASIX) 20 MG tablet Take 1 tablet (20 mg total) by mouth daily.   [DISCONTINUED] rosuvastatin (CRESTOR) 20 MG tablet TAKE 1 TABLET BY MOUTH  ONCE DAILY IN PLACE OF FENOFIBRATE     Allergies:   Ace inhibitors and Morphine and related   Social History   Tobacco Use   Smoking status: Never Smoker   Smokeless tobacco: Never Used  Substance Use Topics   Alcohol use: Yes    Comment: 6 glasses of wine per year   Drug use: No     Family Hx: [name] family history includes Coronary artery disease in his father; Heart attack (age of onset: 19) in his father; Heart disease in his father; Stroke in his mother. There is no history of Colon cancer, Esophageal cancer, Rectal cancer, Stomach cancer, Kidney cancer, Kidney failure, Prostate cancer, or Tuberculosis.  ROS:   Please see the history of present illness.   All other systems reviewed and are negative.   Prior CV studies:   The following studies were reviewed today:  Limited TTE (04/03/2018): Normal LV size with mild LVH.  LVEF 45-50% with anterior, anteroseptal, and apical hypokinesis.  Grade 1 diastolic dysfunction.  Normal RV size and function.  14-day event monitor (04/03/2018): Predominantly sinus rhythm with rare supraventricular and ventricular  activity.  Multiple brief atrial runs lasting up to 20 beats were noted as well as a single 8 beat run of nonsustained ventricular tachycardia.  Significant sinus bradycardia (predominantly overnight) also noted raising the possibility of sleep apnea.  Labs/Other Tests and Data Reviewed:    EKG:  No ECG reviewed.  Recent Labs: 03/20/2018: ALT 16; Magnesium 2.3 08/19/2018: BUN 15; Creatinine, Ser 0.78; Hemoglobin 17.8; Platelets 133.0; Potassium 4.0; Sodium 140; TSH 4.96   Recent Lipid Panel Lab Results  Component Value Date/Time   CHOL 119 03/20/2018 09:47 AM   TRIG 163 (H) 03/20/2018 09:47 AM   HDL 37 (L) 03/20/2018 09:47 AM   CHOLHDL 3.2 03/20/2018 09:47 AM   CHOLHDL 3 11/07/2017 09:48 AM   LDLCALC 49 03/20/2018 09:47 AM   LDLDIRECT 101.0 10/13/2014 11:54 AM    Wt Readings from Last 3 Encounters:  12/25/18 210 lb 4 oz (95.4 kg)  10/18/18 205 lb (93 kg)  09/25/18 206 lb (93.4 kg)     Objective:    Vital Signs:  BP 123/78 (BP Location: Left Arm, Patient Position: Sitting, Cuff Size: Normal)    Pulse (!) 59    Ht 6\' 2"  (1.88 m)    Wt 210 lb 4 oz (95.4 kg)    BMI 26.99 kg/m    VITAL SIGNS:  reviewed  ASSESSMENT & PLAN:    Coronary artery disease: Mr. Doolittle continues to do well without signs or symptoms of worsening coronary insufficiency.  We will continue his current medications for secondary prevention.  Ischemic cardiomyopathy: Mr. Manor reports NYHA class I symptoms without fluid retention.  I am concerned that some of his orthostatic light headedness may actually reflect intravascular volume depletion.  We discussed making furosemide as needed, though Mr. Lage prefers to decrease it to every other day dosing.  We will therefore continue furosemide 20 mg every other day.  Though resting heart rate is borderline low, in the setting of CAD and reduced LVEF, we have agreed to continue carvedilol 3.125 mg twice daily.  I am reluctant to escalate this further.  Given history  of angioedema with ACE inhibitor's, we will defer re-challenging with an ACE inhibitor or ARB.  Hypertension: Overall, blood pressure is well controlled.  Continue current doses of amlodipine and carvedilol.  Hyperlipidemia: LDL at goal on last check in 02/2018.  Continue rosuvastatin  20 mg daily and fish oil.  COVID-19 Education: The signs and symptoms of COVID-19 were discussed with the patient and how to seek care for testing (follow up with PCP or arrange E-visit).  The importance of social distancing was discussed today.  Time:   Today, I have spent 12 minutes with the patient with telehealth technology discussing the above problems.  An additional 10 minutes were spent reviewing the patient's chart and documenting today's encounter.   Medication Adjustments/Labs and Tests Ordered: Current medicines are reviewed at length with the patient today.  Concerns regarding medicines are outlined above.   Tests Ordered: None.  Medication Changes: Meds ordered this encounter  Medications   furosemide (LASIX) 20 MG tablet    Sig: Take 1 tablet (20 mg total) by mouth every other day.    Dispense:  45 tablet    Refill:  2   carvedilol (COREG) 3.125 MG tablet    Sig: Take 1 tablet (3.125 mg total) by mouth 2 (two) times daily.    Dispense:  180 tablet    Refill:  2   rosuvastatin (CRESTOR) 20 MG tablet    Sig: Take 1 tablet (20 mg total) by mouth daily.    Dispense:  90 tablet    Refill:  2    Disposition:  Follow up in 6 month(s)  Signed, [name], MD  12/25/2018 10:09 AM    [facility]

## 2018-12-24 NOTE — Telephone Encounter (Signed)
Virtual Visit Pre-Appointment Phone Call  "(Name), I am calling you today to discuss your upcoming appointment. We are currently trying to limit exposure to the virus that causes COVID-19 by seeing patients at home rather than in the office."  1. "What is the BEST phone number to call the day of the visit?" - include this in appointment notes  2. "Do you have or have access to (through a family member/friend) a smartphone with video capability that we can use for your visit?" a. If yes - list this number in appt notes as "cell" (if different from BEST phone #) and list the appointment type as a VIDEO visit in appointment notes b. If no - list the appointment type as a PHONE visit in appointment notes  3. Confirm consent - "In the setting of the current Covid19 crisis, you are scheduled for a (telephone) visit with your provider on (12/25/2018) at (9:30 am).  Just as we do with many in-office visits, in order for you to participate in this visit, we must obtain consent.  If you'd like, I can send this to your mychart (if signed up) or email for you to review.  Otherwise, I can obtain your verbal consent now.  All virtual visits are billed to your insurance company just like a normal visit would be.  By agreeing to a virtual visit, we'd like you to understand that the technology does not allow for your provider to perform an examination, and thus may limit your provider's ability to fully assess your condition. If your provider identifies any concerns that need to be evaluated in person, we will make arrangements to do so.  Finally, though the technology is pretty good, we cannot assure that it will always work on either your or our end, and in the setting of a video visit, we may have to convert it to a phone-only visit.  In either situation, we cannot ensure that we have a secure connection.  Are you willing to proceed?" Yes  4. Advise patient to be prepared - "Two hours prior to your appointment, go  ahead and check your blood pressure, pulse, oxygen saturation, and your weight (if you have the equipment to check those) and write them all down. When your visit starts, your provider will ask you for this information. If you have an Apple Watch or Kardia device, please plan to have heart rate information ready on the day of your appointment. Please have a pen and paper handy nearby the day of the visit as well."  5. Give patient instructions for MyChart download to smartphone OR Doximity/Doxy.me as below if video visit (depending on what platform provider is using)  6. Inform patient they will receive a phone call 15 minutes prior to their appointment time (may be from unknown caller ID) so they should be prepared to answer    TELEPHONE CALL NOTE  Phillip [name] has been deemed a candidate for a follow-up tele-health visit to limit community exposure during the Covid-19 pandemic. I spoke with the patient via phone to ensure availability of phone/video source, confirm preferred email & phone number, and discuss instructions and expectations.  I reminded Phillip [name] to be prepared with any vital sign and/or heart rhythm information that could potentially be obtained via home monitoring, at the time of his visit. I reminded Phillip [name] to expect a phone call prior to his visit.  Britt Bottom, CMA 12/24/2018 12:25 PM   INSTRUCTIONS FOR DOWNLOADING THE [facility]  APP TO SMARTPHONE  - The patient must first make sure to have activated MyChart and know their login information - If Apple, go to [employer] and type in MyChart in the search bar and download the app. If Android, ask patient to go to [employer] and type in [place] in the search bar and download the app. The app is free but as with any other app downloads, their phone may require them to verify saved payment information or Apple/Android password.  - The patient will need to then log into the app with their MyChart username and password,  and select [facility] as their healthcare provider to link the account. When it is time for your visit, go to the MyChart app, find appointments, and click Begin Video Visit. Be sure to Select Allow for your device to access the Microphone and Camera for your visit. You will then be connected, and your provider will be with you shortly.  **If they have any issues connecting, or need assistance please contact MyChart service desk [phone])**  **If using a computer, in order to ensure the best quality for their visit they will need to use either of the following Internet Browsers: [employer], or Google Chrome**  IF USING DOXIMITY or DOXY.ME - The patient will receive a link just prior to their visit by text.     FULL LENGTH CONSENT FOR TELE-HEALTH VISIT   I hereby voluntarily request, consent and authorize [facility] and its employed or contracted physicians, physician assistants, nurse practitioners or other licensed health care professionals (the Practitioner), to provide me with telemedicine health care services (the "Services") as deemed necessary by the treating Practitioner. I acknowledge and consent to receive the Services by the Practitioner via telemedicine. I understand that the telemedicine visit will involve communicating with the Practitioner through live audiovisual communication technology and the disclosure of certain medical information by electronic transmission. I acknowledge that I have been given the opportunity to request an in-person assessment or other available alternative prior to the telemedicine visit and am voluntarily participating in the telemedicine visit.  I understand that I have the right to withhold or withdraw my consent to the use of telemedicine in the course of my care at any time, without affecting my right to future care or treatment, and that the Practitioner or I may terminate the telemedicine visit at any time. I understand that I  have the right to inspect all information obtained and/or recorded in the course of the telemedicine visit and may receive copies of available information for a reasonable fee.  I understand that some of the potential risks of receiving the Services via telemedicine include:  Marland Kitchen Delay or interruption in medical evaluation due to technological equipment failure or disruption; . Information transmitted may not be sufficient (e.g. poor resolution of images) to allow for appropriate medical decision making by the Practitioner; and/or  . In rare instances, security protocols could fail, causing a breach of personal health information.  Furthermore, I acknowledge that it is my responsibility to provide information about my medical history, conditions and care that is complete and accurate to the best of my ability. I acknowledge that Practitioner's advice, recommendations, and/or decision may be based on factors not within their control, such as incomplete or inaccurate data provided by me or distortions of diagnostic images or specimens that may result from electronic transmissions. I understand that the practice of medicine is not an exact science and that Practitioner makes no  warranties or guarantees regarding treatment outcomes. I acknowledge that I will receive a copy of this consent concurrently upon execution via email to the email address I last provided but may also request a printed copy by calling the office of [facility].    I understand that my insurance will be billed for this visit.   I have read or had this consent read to me. . I understand the contents of this consent, which adequately explains the benefits and risks of the Services being provided via telemedicine.  . I have been provided ample opportunity to ask questions regarding this consent and the Services and have had my questions answered to my satisfaction. . I give my informed consent for the services to be provided through the  use of telemedicine in my medical care  By participating in this telemedicine visit I agree to the above.

## 2018-12-25 ENCOUNTER — Ambulatory Visit: Payer: PPO | Admitting: Urology

## 2018-12-25 ENCOUNTER — Telehealth: Payer: Self-pay | Admitting: *Deleted

## 2018-12-25 ENCOUNTER — Telehealth (INDEPENDENT_AMBULATORY_CARE_PROVIDER_SITE_OTHER): Payer: PPO | Admitting: Internal Medicine

## 2018-12-25 ENCOUNTER — Other Ambulatory Visit: Payer: Self-pay

## 2018-12-25 ENCOUNTER — Encounter: Payer: Self-pay | Admitting: Internal Medicine

## 2018-12-25 VITALS — BP 123/78 | HR 59 | Ht 74.0 in | Wt 210.2 lb

## 2018-12-25 DIAGNOSIS — I10 Essential (primary) hypertension: Secondary | ICD-10-CM | POA: Diagnosis not present

## 2018-12-25 DIAGNOSIS — I255 Ischemic cardiomyopathy: Secondary | ICD-10-CM

## 2018-12-25 DIAGNOSIS — E785 Hyperlipidemia, unspecified: Secondary | ICD-10-CM

## 2018-12-25 DIAGNOSIS — I2510 Atherosclerotic heart disease of native coronary artery without angina pectoris: Secondary | ICD-10-CM | POA: Diagnosis not present

## 2018-12-25 MED ORDER — FUROSEMIDE 20 MG PO TABS: 20.0000 mg | ORAL_TABLET | ORAL | 2 refills | Status: DC

## 2018-12-25 MED ORDER — CARVEDILOL 3.125 MG PO TABS: 3.1250 mg | ORAL_TABLET | Freq: Two times a day (BID) | ORAL | 2 refills | Status: DC

## 2018-12-25 MED ORDER — ROSUVASTATIN CALCIUM 20 MG PO TABS: 20.0000 mg | ORAL_TABLET | Freq: Every day | ORAL | 2 refills | Status: DC

## 2018-12-25 NOTE — Telephone Encounter (Signed)
Called pt to reschedule postponed procedure.  Rescheduled for 5/28 @ 930.  Will send new prep instructions via My Chart.

## 2018-12-25 NOTE — Patient Instructions (Signed)
Medication Instructions:  Your physician has recommended you make the following change in your medication:  1- CHANGE Furosemide to 20 mg by mouth every other day.  I have sent in refills for your carvedilol, rosuvastatin, and furosemide.    If you need a refill on your cardiac medications before your next appointment, please call your pharmacy.   Lab work: none If you have labs (blood work) drawn today and your tests are completely normal, you will receive your results only by: Marland Kitchen MyChart Message (if you have MyChart) OR . A paper copy in the mail If you have any lab test that is abnormal or we need to change your treatment, we will call you to review the results.  Testing/Procedures: none  Follow-Up: At [facility], you and your health needs are our priority.  As part of our continuing mission to provide you with exceptional heart care, we have created designated Provider Care Teams.  These Care Teams include your primary Cardiologist (physician) and Advanced Practice Providers (APPs -  Physician Assistants and Nurse Practitioners) who all work together to provide you with the care you need, when you need it. You will need a follow up appointment in 6 months.  Please call our office 2 months in advance to schedule this appointment.  You may see [name], MD or one of the following Advanced Practice Providers on your designated Care Team:   Murray Hodgkins, NP Christell Faith, PA-C . Marrianne Mood, PA-C

## 2018-12-30 ENCOUNTER — Telehealth: Payer: Self-pay | Admitting: Urology

## 2018-12-30 NOTE — Telephone Encounter (Signed)
Pt would like to talk to Dr Diamantina Providence concerning his PTNS he doesn't feel like he needs to continue these.

## 2018-12-31 ENCOUNTER — Ambulatory Visit: Payer: PPO

## 2018-12-31 NOTE — Telephone Encounter (Signed)
Agree, set him up for a telephone virtual visit sometime in the next 1-2 weeks.  Thanks Nickolas Madrid, MD 12/31/2018

## 2018-12-31 NOTE — Telephone Encounter (Signed)
Should I have this pt scheduled for a telephone/virtual visit? Please advise

## 2019-01-01 NOTE — Telephone Encounter (Signed)
Virtual visit scheduled.  

## 2019-01-07 ENCOUNTER — Ambulatory Visit: Payer: PPO

## 2019-01-08 ENCOUNTER — Other Ambulatory Visit: Payer: Self-pay

## 2019-01-08 ENCOUNTER — Telehealth (INDEPENDENT_AMBULATORY_CARE_PROVIDER_SITE_OTHER): Payer: PPO | Admitting: Urology

## 2019-01-08 DIAGNOSIS — N3281 Overactive bladder: Secondary | ICD-10-CM

## 2019-01-08 NOTE — Progress Notes (Signed)
Virtual Visit via Telephone Note  I connected with Phillip [name] on 01/08/19 at  1:30 PM EDT by telephone and verified that I am speaking with the correct person using two identifiers.   I discussed the limitations, risks, security and privacy concerns of performing an evaluation and management service by telephone and the availability of in person appointments. We discussed the impact of the COVID-19 on the healthcare system, and the importance of social distancing and reducing patient and provider exposure. I also discussed with the patient that there may be a patient responsible charge related to this service. The patient expressed understanding and agreed to proceed.  Reason for visit: OAB  History of Present Illness: I had a phone visit follow-up with Phillip [name] today regarding his overactive bladder symptoms.  Briefly, he is a 78 year old male with a history of oral cancer treated with radiation who lives primarily off of water and Glucerna secondary to residual side effects from radiation with moderate OAB symptoms.  These were confirmed on urodynamics performed in [facility] with a bladder capacity of 225 cc, unstable contractions and voiding with urgency.  Max flow was 13 mL/s with a PVR of 0 with no bladder outlet obstruction.  He was followed by a different urologist previously, who had trialed him on multiple oral anticholinergics and Myrbetriq that did not improve his symptoms.  We had discussed behavioral strategies at length previously.  We also trialed nocturnal which did temporarily improve his symptoms, however this medication was ultimately cost prohibitive.  Most recently, he completed a 12 session course of PTNS which she feels only minimally improved his urinary symptoms.  He is a former Chief Financial Officer, and is continued to keep a bladder diary.  He reports that over the last 2 months he averages 8.8 daytime voids, 3.5 nighttime voids, and 1.5 episodes of urinary leakage during the day.  He  reports these have been stable since starting PTNS.  Does not wish to pursue any further PTNS at this time.  We discussed alternative options for OAB, primarily Botox at this point.  We discussed the potential benefits, as well as the risks including requirement for general anesthesia and possible urinary retention.  He would like to hold off on any further interventions at this time and follow-up as needed.  When we initially met, he was voiding 7-8 times per night, so overall his symptoms have improved by about 50%.   Assessment and Plan: 78 year old man with OAB refractory to medications and PTNS.  Currently off all medications and completed a 12-week course of PTNS, does not wish to pursue any further PTNS maintenance.  Follow Up: He would like to follow-up on an as-needed basis   I discussed the assessment and treatment plan with the patient. The patient was provided an opportunity to ask questions and all were answered. The patient agreed with the plan and demonstrated an understanding of the instructions.   The patient was advised to call back or seek an in-person evaluation if the symptoms worsen or if the condition fails to improve as anticipated.  I provided 20 minutes of non-face-to-face time during this encounter.   Billey Co, MD

## 2019-01-14 ENCOUNTER — Telehealth: Payer: Self-pay

## 2019-01-14 NOTE — Telephone Encounter (Signed)
Covid-19 travel screening questions  Have you traveled in the last 14 days? No If yes where?  Do you now or have you had a fever in the last 14 days? No  Do you have any respiratory symptoms of shortness of breath or cough now or in the last 14 days? No  Do you have any family members or close contacts with diagnosed or suspected Covid-19? No       

## 2019-01-16 ENCOUNTER — Other Ambulatory Visit: Payer: Self-pay

## 2019-01-16 ENCOUNTER — Ambulatory Visit: Payer: PPO | Admitting: Urology

## 2019-01-16 ENCOUNTER — Ambulatory Visit (AMBULATORY_SURGERY_CENTER): Payer: PPO | Admitting: Internal Medicine

## 2019-01-16 ENCOUNTER — Encounter: Payer: Self-pay | Admitting: Internal Medicine

## 2019-01-16 VITALS — BP 123/75 | HR 62 | Temp 98.6°F | Resp 18 | Ht 74.0 in | Wt 205.0 lb

## 2019-01-16 DIAGNOSIS — I10 Essential (primary) hypertension: Secondary | ICD-10-CM | POA: Diagnosis not present

## 2019-01-16 DIAGNOSIS — I2510 Atherosclerotic heart disease of native coronary artery without angina pectoris: Secondary | ICD-10-CM | POA: Diagnosis not present

## 2019-01-16 DIAGNOSIS — Z1211 Encounter for screening for malignant neoplasm of colon: Secondary | ICD-10-CM | POA: Diagnosis not present

## 2019-01-16 DIAGNOSIS — Z86010 Personal history of colonic polyps: Secondary | ICD-10-CM | POA: Diagnosis not present

## 2019-01-16 MED ORDER — SODIUM CHLORIDE 0.9 % IV SOLN: 500.0000 mL | Freq: Once | INTRAVENOUS | Status: DC

## 2019-01-16 NOTE — Progress Notes (Signed)
Pt's states no medical or surgical changes since previsit or office visit. 

## 2019-01-16 NOTE — Progress Notes (Signed)
Report to PACU, RN, vss, BBS= Clear.  

## 2019-01-16 NOTE — Patient Instructions (Signed)
Diverticulosis and internal hemorrhoids.  No repeat colonoscopy due to age.    YOU HAD AN ENDOSCOPIC PROCEDURE TODAY AT [facility]:   Refer to the procedure report that was given to you for any specific questions about what was found during the examination.  If the procedure report does not answer your questions, please call your gastroenterologist to clarify.  If you requested that your care partner not be given the details of your procedure findings, then the procedure report has been included in a sealed envelope for you to review at your convenience later.  YOU SHOULD EXPECT: Some feelings of bloating in the abdomen. Passage of more gas than usual.  Walking can help get rid of the air that was put into your GI tract during the procedure and reduce the bloating. If you had a lower endoscopy (such as a colonoscopy or flexible sigmoidoscopy) you may notice spotting of blood in your stool or on the toilet paper. If you underwent a bowel prep for your procedure, you may not have a normal bowel movement for a few days.  Please Note:  You might notice some irritation and congestion in your nose or some drainage.  This is from the oxygen used during your procedure.  There is no need for concern and it should clear up in a day or so.  SYMPTOMS TO REPORT IMMEDIATELY:   Following lower endoscopy (colonoscopy or flexible sigmoidoscopy):  Excessive amounts of blood in the stool  Significant tenderness or worsening of abdominal pains  Swelling of the abdomen that is new, acute  Fever of 100F or higher   For urgent or emergent issues, a gastroenterologist can be reached at any hour by calling [phone].   DIET:  We do recommend a small meal at first, but then you may proceed to your regular diet.  Drink plenty of fluids but you should avoid alcoholic beverages for 24 hours.  ACTIVITY:  You should plan to take it easy for the rest of today and you should NOT DRIVE or use heavy  machinery until tomorrow (because of the sedation medicines used during the test).    FOLLOW UP: Our staff will call the number listed on your records 48-72 hours following your procedure to check on you and address any questions or concerns that you may have regarding the information given to you following your procedure. If we do not reach you, we will leave a message.  We will attempt to reach you two times.  During this call, we will ask if you have developed any symptoms of COVID 19. If you develop any symptoms (ie: fever, flu-like symptoms, shortness of breath, cough etc.) before then, please call [phone].  If you test positive for Covid 19 in the 2 weeks post procedure, please call and report this information to [place].    If any biopsies were taken you will be contacted by phone or by letter within the next 1-3 weeks.  Please call us at [phone] if you have not heard about the biopsies in 3 weeks.    SIGNATURES/CONFIDENTIALITY: You and/or your care partner have signed paperwork which will be entered into your electronic medical record.  These signatures attest to the fact that that the information above on your After Visit Summary has been reviewed and is understood.  Full responsibility of the confidentiality of this discharge information lies with you and/or your care-partner.

## 2019-01-16 NOTE — Op Note (Signed)
[facility] Patient Name: Phillip [name] Procedure Date: 01/16/2019 9:19 AM MRN: [id number] Endoscopist: Jerene Bears , MD Age: 78 Referring MD:  Date of Birth: 02/02/1941 Gender: Male Account #: [id number] Procedure:                Colonoscopy Indications:              High risk colon cancer surveillance: Personal                            history of multiple (3 or more) adenomas, Last                            colonoscopy: August 2016 Medicines:                Monitored Anesthesia Care Procedure:                Pre-Anesthesia Assessment:                           - Prior to the procedure, a History and Physical                            was performed, and patient medications and                            allergies were reviewed. [name] tolerance of                            previous anesthesia was also reviewed. The risks                            and benefits of the procedure and the sedation                            options and risks were discussed with the patient.                            All questions were answered, and informed consent                            was obtained. Prior Anticoagulants: The patient has                            taken no previous anticoagulant or antiplatelet                            agents. ASA Grade Assessment: III - A patient with                            severe systemic disease. After reviewing the risks                            and benefits, the patient was deemed in  satisfactory condition to undergo the procedure.                           After obtaining informed consent, the colonoscope                            was passed under direct vision. Throughout the                            procedure, the patient's blood pressure, pulse, and                            oxygen saturations were monitored continuously. The                            Colonoscope was introduced through the anus  and                            advanced to the cecum, identified by appendiceal                            orifice and ileocecal valve. The colonoscopy was                            performed without difficulty. The patient tolerated                            the procedure well. The quality of the bowel                            preparation was fair. The ileocecal valve,                            appendiceal orifice, and rectum were photographed. Scope In: 9:27:41 AM Scope Out: 9:55:25 AM Scope Withdrawal Time: 0 hours 22 minutes 10 seconds  Total Procedure Duration: 0 hours 27 minutes 44 seconds  Findings:                 The digital rectal exam was normal.                           Multiple small and large-mouthed diverticula were                            found in the sigmoid colon.                           Internal hemorrhoids were found during                            retroflexion. The hemorrhoids were small.                           The exam was otherwise without abnormality. Complications:            No immediate  complications. Estimated Blood Loss:     Estimated blood loss: none. Impression:               - Preparation of the colon was fair clearing with                            irrigation and lavage (sensitivity of colonoscopy                            is lowered when preparation not excellent or good).                           - Diverticulosis in the sigmoid colon.                           - Small internal hemorrhoids.                           - The examination was otherwise normal.                           - No specimens collected. Recommendation:           - Patient has a contact number available for                            emergencies. The signs and symptoms of potential                            delayed complications were discussed with the                            patient. Return to normal activities tomorrow.                            Written  discharge instructions were provided to the                            patient.                           - Resume previous diet.                           - Continue present medications.                           - No repeat colonoscopy due to age > 65 years at                            time of next surveillance examination. Jerene Bears, MD 01/16/2019 10:04:29 AM This report has been signed electronically.

## 2019-01-20 ENCOUNTER — Telehealth: Payer: Self-pay

## 2019-01-20 HISTORY — PX: COLONOSCOPY: SHX174

## 2019-01-20 NOTE — Telephone Encounter (Signed)
  Follow up Call-  Call back number 01/16/2019  Post procedure Call Back phone  # [phone]  Permission to leave phone message Yes  Some recent data might be hidden     Patient questions:  Do you have a fever, pain , or abdominal swelling? No. Pain Score  0 *  Have you tolerated food without any problems? Yes.    Have you been able to return to your normal activities? Yes.    Do you have any questions about your discharge instructions: Diet   No. Medications  No. Follow up visit  No.  Do you have questions or concerns about your Care? No.  Actions: * If pain score is 4 or above: No action needed, pain <4.   1. Have you developed a fever since your procedure? no  2.   Have you had an respiratory symptoms (SOB or cough) since your procedure? no  3.   Have you tested positive for COVID 19 since your procedure no  4.   Have you had any family members/close contacts diagnosed with the COVID 19 since your procedure? no   If yes to any of these questions please route to Joylene John, RN and Alphonsa Gin, Therapist, sports.

## 2019-03-02 ENCOUNTER — Encounter: Payer: Self-pay | Admitting: Family Medicine

## 2019-03-03 DIAGNOSIS — Z1283 Encounter for screening for malignant neoplasm of skin: Secondary | ICD-10-CM | POA: Diagnosis not present

## 2019-03-03 DIAGNOSIS — L720 Epidermal cyst: Secondary | ICD-10-CM | POA: Diagnosis not present

## 2019-03-03 DIAGNOSIS — D692 Other nonthrombocytopenic purpura: Secondary | ICD-10-CM | POA: Diagnosis not present

## 2019-03-03 DIAGNOSIS — D229 Melanocytic nevi, unspecified: Secondary | ICD-10-CM | POA: Diagnosis not present

## 2019-03-03 DIAGNOSIS — L738 Other specified follicular disorders: Secondary | ICD-10-CM | POA: Diagnosis not present

## 2019-03-03 DIAGNOSIS — Z85828 Personal history of other malignant neoplasm of skin: Secondary | ICD-10-CM | POA: Diagnosis not present

## 2019-03-03 DIAGNOSIS — L570 Actinic keratosis: Secondary | ICD-10-CM | POA: Diagnosis not present

## 2019-03-03 DIAGNOSIS — L821 Other seborrheic keratosis: Secondary | ICD-10-CM | POA: Diagnosis not present

## 2019-03-03 DIAGNOSIS — L578 Other skin changes due to chronic exposure to nonionizing radiation: Secondary | ICD-10-CM | POA: Diagnosis not present

## 2019-03-03 DIAGNOSIS — D1800 Hemangioma unspecified site: Secondary | ICD-10-CM | POA: Diagnosis not present

## 2019-03-03 DIAGNOSIS — L820 Inflamed seborrheic keratosis: Secondary | ICD-10-CM | POA: Diagnosis not present

## 2019-03-03 DIAGNOSIS — L918 Other hypertrophic disorders of the skin: Secondary | ICD-10-CM | POA: Diagnosis not present

## 2019-04-18 ENCOUNTER — Other Ambulatory Visit: Payer: Self-pay

## 2019-04-18 ENCOUNTER — Ambulatory Visit: Payer: PPO

## 2019-04-18 ENCOUNTER — Other Ambulatory Visit (INDEPENDENT_AMBULATORY_CARE_PROVIDER_SITE_OTHER): Payer: PPO

## 2019-04-18 ENCOUNTER — Other Ambulatory Visit: Payer: Self-pay | Admitting: Family Medicine

## 2019-04-18 DIAGNOSIS — E785 Hyperlipidemia, unspecified: Secondary | ICD-10-CM | POA: Diagnosis not present

## 2019-04-18 DIAGNOSIS — R946 Abnormal results of thyroid function studies: Secondary | ICD-10-CM | POA: Diagnosis not present

## 2019-04-18 DIAGNOSIS — E039 Hypothyroidism, unspecified: Secondary | ICD-10-CM

## 2019-04-18 DIAGNOSIS — I10 Essential (primary) hypertension: Secondary | ICD-10-CM

## 2019-04-18 DIAGNOSIS — D696 Thrombocytopenia, unspecified: Secondary | ICD-10-CM

## 2019-04-18 DIAGNOSIS — R7303 Prediabetes: Secondary | ICD-10-CM | POA: Diagnosis not present

## 2019-04-18 LAB — COMPREHENSIVE METABOLIC PANEL
ALT: 19 U/L (ref 0–53)
AST: 27 U/L (ref 0–37)
Albumin: 4.3 g/dL (ref 3.5–5.2)
Alkaline Phosphatase: 63 U/L (ref 39–117)
BUN: 21 mg/dL (ref 6–23)
CO2: 30 mEq/L (ref 19–32)
Calcium: 9.4 mg/dL (ref 8.4–10.5)
Chloride: 104 mEq/L (ref 96–112)
Creatinine, Ser: 0.77 mg/dL (ref 0.40–1.50)
GFR: 97.31 mL/min (ref >60.00)
Glucose, Bld: 115 mg/dL — ABNORMAL HIGH (ref 70–99)
Potassium: 4 mEq/L (ref 3.5–5.1)
Sodium: 141 mEq/L (ref 135–145)
Total Bilirubin: 0.7 mg/dL (ref 0.2–1.2)
Total Protein: 6.5 g/dL (ref 6.0–8.3)

## 2019-04-18 LAB — CBC WITH DIFFERENTIAL/PLATELET
Basophils Absolute: 0 10*3/uL (ref 0.0–0.1)
Basophils Relative: 0.4 % (ref 0.0–3.0)
Eosinophils Absolute: 0.1 10*3/uL (ref 0.0–0.7)
Eosinophils Relative: 2.6 % (ref 0.0–5.0)
HCT: 49.2 % (ref 39.0–52.0)
Hemoglobin: 16.7 g/dL (ref 13.0–17.0)
Lymphocytes Relative: 13.1 % (ref 12.0–46.0)
Lymphs Abs: 0.5 10*3/uL — ABNORMAL LOW (ref 0.7–4.0)
MCHC: 34 g/dL (ref 30.0–36.0)
MCV: 98.8 fl (ref 78.0–100.0)
Monocytes Absolute: 0.5 10*3/uL (ref 0.1–1.0)
Monocytes Relative: 11.6 % (ref 3.0–12.0)
Neutro Abs: 2.9 10*3/uL (ref 1.4–7.7)
Neutrophils Relative %: 72.3 % (ref 43.0–77.0)
Platelets: 113 10*3/uL — ABNORMAL LOW (ref 150.0–400.0)
RBC: 4.97 Mil/uL (ref 4.22–5.81)
RDW: 12.8 % (ref 11.5–15.5)
WBC: 4 10*3/uL (ref 4.0–10.5)

## 2019-04-18 LAB — LIPID PANEL
Cholesterol: 107 mg/dL (ref 0–200)
HDL: 37.3 mg/dL — ABNORMAL LOW (ref >39.00)
LDL Cholesterol: 50 mg/dL (ref 0–99)
NonHDL: 69.77
Total CHOL/HDL Ratio: 3
Triglycerides: 100 mg/dL (ref 0.0–149.0)
VLDL: 20 mg/dL (ref 0.0–40.0)

## 2019-04-18 LAB — T4, FREE: Free T4: 0.8 ng/dL (ref 0.60–1.60)

## 2019-04-18 LAB — MICROALBUMIN / CREATININE URINE RATIO
Creatinine,U: 138.5 mg/dL
Microalb Creat Ratio: 2.7 mg/g (ref 0.0–30.0)
Microalb, Ur: 3.7 mg/dL — ABNORMAL HIGH (ref 0.0–1.9)

## 2019-04-18 LAB — TSH: TSH: 6.05 u[IU]/mL — ABNORMAL HIGH (ref 0.35–4.50)

## 2019-04-18 LAB — HEMOGLOBIN A1C: Hgb A1c MFr Bld: 5.9 % (ref 4.6–6.5)

## 2019-04-25 ENCOUNTER — Other Ambulatory Visit: Payer: Self-pay

## 2019-04-25 ENCOUNTER — Ambulatory Visit (INDEPENDENT_AMBULATORY_CARE_PROVIDER_SITE_OTHER): Payer: PPO | Admitting: Family Medicine

## 2019-04-25 ENCOUNTER — Encounter: Payer: Self-pay | Admitting: Family Medicine

## 2019-04-25 VITALS — BP 118/70 | HR 48 | Temp 98.5°F | Ht 71.5 in | Wt 207.2 lb

## 2019-04-25 DIAGNOSIS — I6523 Occlusion and stenosis of bilateral carotid arteries: Secondary | ICD-10-CM

## 2019-04-25 DIAGNOSIS — E785 Hyperlipidemia, unspecified: Secondary | ICD-10-CM

## 2019-04-25 DIAGNOSIS — I2510 Atherosclerotic heart disease of native coronary artery without angina pectoris: Secondary | ICD-10-CM

## 2019-04-25 DIAGNOSIS — Z7189 Other specified counseling: Secondary | ICD-10-CM

## 2019-04-25 DIAGNOSIS — Z951 Presence of aortocoronary bypass graft: Secondary | ICD-10-CM

## 2019-04-25 DIAGNOSIS — N3281 Overactive bladder: Secondary | ICD-10-CM

## 2019-04-25 DIAGNOSIS — H9193 Unspecified hearing loss, bilateral: Secondary | ICD-10-CM

## 2019-04-25 DIAGNOSIS — I5022 Chronic systolic (congestive) heart failure: Secondary | ICD-10-CM

## 2019-04-25 DIAGNOSIS — Z23 Encounter for immunization: Secondary | ICD-10-CM | POA: Diagnosis not present

## 2019-04-25 DIAGNOSIS — R011 Cardiac murmur, unspecified: Secondary | ICD-10-CM

## 2019-04-25 DIAGNOSIS — Z0000 Encounter for general adult medical examination without abnormal findings: Secondary | ICD-10-CM | POA: Diagnosis not present

## 2019-04-25 DIAGNOSIS — D696 Thrombocytopenia, unspecified: Secondary | ICD-10-CM

## 2019-04-25 DIAGNOSIS — R7303 Prediabetes: Secondary | ICD-10-CM

## 2019-04-25 DIAGNOSIS — K5909 Other constipation: Secondary | ICD-10-CM

## 2019-04-25 DIAGNOSIS — I10 Essential (primary) hypertension: Secondary | ICD-10-CM

## 2019-04-25 DIAGNOSIS — I255 Ischemic cardiomyopathy: Secondary | ICD-10-CM

## 2019-04-25 DIAGNOSIS — C099 Malignant neoplasm of tonsil, unspecified: Secondary | ICD-10-CM

## 2019-04-25 DIAGNOSIS — I495 Sick sinus syndrome: Secondary | ICD-10-CM

## 2019-04-25 DIAGNOSIS — E039 Hypothyroidism, unspecified: Secondary | ICD-10-CM

## 2019-04-25 DIAGNOSIS — N401 Enlarged prostate with lower urinary tract symptoms: Secondary | ICD-10-CM

## 2019-04-25 MED ORDER — LEVOTHYROXINE SODIUM 25 MCG PO TABS: 25.0000 ug | ORAL_TABLET | Freq: Every day | ORAL | 6 refills | Status: DC

## 2019-04-25 MED ORDER — DOCUSATE SODIUM 250 MG PO CAPS: ORAL_CAPSULE | ORAL | Status: AC

## 2019-04-25 NOTE — Patient Instructions (Addendum)
High dose flu shot today If interested, check with pharmacy about new 2 shot shingles series (shingrix).  I will send prescription for plain tetanus to price out at pharmacy.  Start low dose thyroid replacement (levothyroxine 70mcg daily), let me know how you do with this. Take on empty stomach with glass of water.  Bring me copy of advanced directives.  We will refer you for neck ultrasound Good to see you today, return as needed or in 6 months for follow up visit.   Health Maintenance After Age 55 After age 81, you are at a higher risk for certain long-term diseases and infections as well as injuries from falls. Falls are a major cause of broken bones and head injuries in people who are older than age 98. Getting regular preventive care can help to keep you healthy and well. Preventive care includes getting regular testing and making lifestyle changes as recommended by your health care provider. Talk with your health care provider about:  Which screenings and tests you should have. A screening is a test that checks for a disease when you have no symptoms.  A diet and exercise plan that is right for you. What should I know about screenings and tests to prevent falls? Screening and testing are the best ways to find a health problem early. Early diagnosis and treatment give you the best chance of managing medical conditions that are common after age 35. Certain conditions and lifestyle choices may make you more likely to have a fall. Your health care provider may recommend:  Regular vision checks. Poor vision and conditions such as cataracts can make you more likely to have a fall. If you wear glasses, make sure to get your prescription updated if your vision changes.  Medicine review. Work with your health care provider to regularly review all of the medicines you are taking, including over-the-counter medicines. Ask your health care provider about any side effects that may make you more likely to  have a fall. Tell your health care provider if any medicines that you take make you feel dizzy or sleepy.  Osteoporosis screening. Osteoporosis is a condition that causes the bones to get weaker. This can make the bones weak and cause them to break more easily.  Blood pressure screening. Blood pressure changes and medicines to control blood pressure can make you feel dizzy.  Strength and balance checks. Your health care provider may recommend certain tests to check your strength and balance while standing, walking, or changing positions.  Foot health exam. Foot pain and numbness, as well as not wearing proper footwear, can make you more likely to have a fall.  Depression screening. You may be more likely to have a fall if you have a fear of falling, feel emotionally low, or feel unable to do activities that you used to do.  Alcohol use screening. Using too much alcohol can affect your balance and may make you more likely to have a fall. What actions can I take to lower my risk of falls? General instructions  Talk with your health care provider about your risks for falling. Tell your health care provider if: ? You fall. Be sure to tell your health care provider about all falls, even ones that seem minor. ? You feel dizzy, sleepy, or off-balance.  Take over-the-counter and prescription medicines only as told by your health care provider. These include any supplements.  Eat a healthy diet and maintain a healthy weight. A healthy diet includes low-fat dairy  products, low-fat (lean) meats, and fiber from whole grains, beans, and lots of fruits and vegetables. Home safety  Remove any tripping hazards, such as rugs, cords, and clutter.  Install safety equipment such as grab bars in bathrooms and safety rails on stairs.  Keep rooms and walkways well-lit. Activity   Follow a regular exercise program to stay fit. This will help you maintain your balance. Ask your health care provider what  types of exercise are appropriate for you.  If you need a cane or walker, use it as recommended by your health care provider.  Wear supportive shoes that have nonskid soles. Lifestyle  Do not drink alcohol if your health care provider tells you not to drink.  If you drink alcohol, limit how much you have: ? 0-1 drink a day for women. ? 0-2 drinks a day for men.  Be aware of how much alcohol is in your drink. In the [place] one drink equals one typical bottle of beer (12 oz), one-half glass of wine (5 oz), or one shot of hard liquor (1 oz).  Do not use any products that contain nicotine or tobacco, such as cigarettes and e-cigarettes. If you need help quitting, ask your health care provider. Summary  Having a healthy lifestyle and getting preventive care can help to protect your health and wellness after age 32.  Screening and testing are the best way to find a health problem early and help you avoid having a fall. Early diagnosis and treatment give you the best chance for managing medical conditions that are more common for people who are older than age 32.  Falls are a major cause of broken bones and head injuries in people who are older than age 101. Take precautions to prevent a fall at home.  Work with your health care provider to learn what changes you can make to improve your health and wellness and to prevent falls. This information is not intended to replace advice given to you by your health care provider. Make sure you discuss any questions you have with your health care provider. Document Released: 06/20/2017 Document Revised: 11/28/2018 Document Reviewed: 06/20/2017 Elsevier Patient Education  2020 [employer].

## 2019-04-25 NOTE — Progress Notes (Signed)
This visit was conducted in person.  BP 118/70 (BP Location: Left Arm, Patient Position: Sitting, Cuff Size: Normal)   Pulse (!) 48   Temp 98.5 F (36.9 C) (Temporal)   Ht 5' 11.5" (1.816 m)   Wt 207 lb 3 oz (94 kg)   SpO2 94%   BMI 28.49 kg/m    CC: AMW/CPE Subjective:    Patient ID: Phillip [name], male    DOB: 1941/07/01, 78 y.o.   MRN: [id number]  HPI: Phillip [name] is a 78 y.o. male presenting on 04/25/2019 for Medicare Wellness (Has forms to be completed. )   Did not see health advisor this year.    Hearing Screening   125Hz  250Hz  500Hz  1000Hz  2000Hz  3000Hz  4000Hz  6000Hz  8000Hz   Right ear:           Left ear:           Comments: Wears bilateral hearing aids  Vision Screening Comments: Last eye exam, 07/2018    Office Visit from 04/25/2019 in [place] [facility] [place]  PHQ-2 Total Score  0      Fall Risk  04/25/2019 02/07/2018 11/07/2017 12/18/2016 05/29/2016  Falls in the past year? 0 No No No No   H/o CABG 11/2017, newly noted chronic systolic CHF 2/2 ischemic CM, afib/flutter s/p successful cardioversion 01/2018 (End). Completed cardiac rehab.   [facility] for overactive bladder, had PTNS treatment without much benefit. Tried and failed oral medications. Nocturia x3-4. Uses depends, has decided against botox injections.   Preventative: COLONOSCOPY 03/2015 TA x3, HP, melanosis coli, severe diverticulosis, rpt 3 yrs (Pyrtle) COLONOSCOPY 12/2018 - fair prep, diverticulosis, f/u PRN (Pyrtle) Prostate cancer screening -prior saw urology (Dr Risa Grill). Ongoing LUTS and nocturia.established with Dr Diamantina Providence - see above Lung cancer screening -not eligible Flu shot -yearly Td 2010  Pneumovax 2007, 2016, prevnar 2015 x2 Zostavax per patient. Shingrix - discussed Advanced directive discussion - working on this. Wife is HCPOA.  Seat belt use discussed Sunscreen use discussed. No changing moles on skin. 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  Plays golf, remains very active with an interest in politics. Jan 2011 moved to area.  Involved in [employer].  He is a runner - has run [employer] and [employer]. S/p torn meniscus.     Relevant past medical, surgical, family and social history reviewed and updated as indicated. Interim medical history since our last visit reviewed. Allergies and medications reviewed and updated. Outpatient Medications Prior to Visit  Medication Sig Dispense Refill  . amLODipine (NORVASC) 10 MG tablet Take 1 tablet (10 mg total) by mouth daily. 90 tablet 3  . Ascorbic Acid (VITAMIN C PO) Take 500 mg by mouth 2 (two) times a day.     Phillip [name] Kitchen aspirin EC 81 MG tablet Take 1 tablet (81 mg total) by mouth daily. 90 tablet 3  . carvedilol (COREG) 3.125 MG tablet Take 1 tablet (3.125 mg total) by mouth 2 (two) times daily. 180 tablet 2  . Cholecalciferol (VITAMIN D3) 25 MCG (1000 UT) CAPS Take 1 capsule by mouth daily.    . furosemide (LASIX) 20 MG tablet Take 1 tablet (20 mg total) by mouth every other day. 45 tablet 2  .  MAGNESIUM PO Take 1 tablet by mouth daily.    . Multiple Vitamin (MULTIVITAMIN) tablet Take 1 tablet by mouth daily.    . Multiple Vitamins-Minerals (ZINC PO) Take by mouth daily.    . nitroGLYCERIN (NITROSTAT) 0.4 MG SL tablet Place 1 tablet (0.4 mg total) under the tongue every 5 (five) minutes as needed for chest pain. 30 tablet prn  . Omega-3 Fatty Acids (OMEGA 3 PO) Take by mouth daily.    . Potassium 99 MG TABS Take 1 tablet by mouth daily.    . rosuvastatin (CRESTOR) 20 MG tablet Take 1 tablet (20 mg total) by mouth daily. 90 tablet 2  . VITAMIN A PO Take 2,400 mcg by mouth daily.     . vitamin E 400 UNIT  capsule Take 400 Units by mouth daily.    . zaleplon (SONATA) 10 MG capsule Take 1 capsule (10 mg total) by mouth at bedtime as needed for sleep. 30 capsule 0  . Zinc 50 MG CAPS Take 50 mg by mouth daily.    Mariane Baumgarten Sodium (STOOL SOFTENER) 100 MG capsule Take 600 mg by mouth daily.      No facility-administered medications prior to visit.      Per HPI unless specifically indicated in ROS section below Review of Systems  Constitutional: Negative for activity change, appetite change, chills, fatigue, fever and unexpected weight change.  HENT: Negative for hearing loss.   Eyes: Negative for visual disturbance.  Respiratory: Negative for cough, chest tightness, shortness of breath and wheezing.   Cardiovascular: Negative for chest pain, palpitations and leg swelling.  Gastrointestinal: Negative for abdominal distention, abdominal pain, blood in stool, constipation, diarrhea, nausea and vomiting.  Genitourinary: Negative for difficulty urinating and hematuria.  Musculoskeletal: Negative for arthralgias, myalgias and neck pain.  Skin: Negative for rash.  Neurological: Positive for light-headedness. Negative for dizziness, seizures, syncope and headaches.  Hematological: Negative for adenopathy. Does not bruise/bleed easily.  Psychiatric/Behavioral: Negative for dysphoric mood. The patient is not nervous/anxious.    Objective:    BP 118/70 (BP Location: Left Arm, Patient Position: Sitting, Cuff Size: Normal)   Pulse (!) 48   Temp 98.5 F (36.9 C) (Temporal)   Ht 5' 11.5" (1.816 m)   Wt 207 lb 3 oz (94 kg)   SpO2 94%   BMI 28.49 kg/m   Wt Readings from Last 3 Encounters:  04/25/19 207 lb 3 oz (94 kg)  01/16/19 205 lb (93 kg)  12/25/18 210 lb 4 oz (95.4 kg)    Physical Exam Vitals signs and nursing note reviewed.  Constitutional:      General: He is not in acute distress.    Appearance: Normal appearance. He is well-developed. He is not ill-appearing.  HENT:     Head:  Normocephalic and atraumatic.     Right Ear: Hearing, tympanic membrane, ear canal and external ear normal.     Left Ear: Hearing, tympanic membrane, ear canal and external ear normal.     Nose: Nose normal.     Mouth/Throat:     Mouth: Mucous membranes are moist.     Pharynx: Uvula midline. No oropharyngeal exudate or posterior oropharyngeal erythema.  Eyes:     General: No scleral icterus.    Extraocular Movements: Extraocular movements intact.     Conjunctiva/sclera: Conjunctivae normal.     Pupils: Pupils are equal, round, and reactive to light.  Neck:     Musculoskeletal: Normal range of motion and neck supple.  Vascular: Carotid bruit present.  Cardiovascular:     Rate and Rhythm: Normal rate and regular rhythm.     Pulses: Normal pulses.          Radial pulses are 2+ on the right side and 2+ on the left side.     Heart sounds: Murmur (3/6 systolic) present.  Pulmonary:     Effort: Pulmonary effort is normal. No respiratory distress.     Breath sounds: Normal breath sounds. No wheezing, rhonchi or rales.  Abdominal:     General: Abdomen is flat. Bowel sounds are normal. There is no distension.     Palpations: Abdomen is soft. There is no mass.     Tenderness: There is no abdominal tenderness. There is no guarding or rebound.     Hernia: No hernia is present.  Musculoskeletal: Normal range of motion.     Right lower leg: No edema.     Left lower leg: No edema.  Lymphadenopathy:     Cervical: No cervical adenopathy.  Skin:    General: Skin is warm and dry.     Findings: No rash.  Neurological:     General: No focal deficit present.     Mental Status: He is alert and oriented to person, place, and time.     Comments:  CN grossly intact, station and gait intact Recall 2/3, 3/3 with cue Calculation 5/5 [id number]  Psychiatric:        Mood and Affect: Mood normal.        Behavior: Behavior normal.        Thought Content: Thought content normal.        Judgment:  Judgment normal.       Results for orders placed or performed in visit on 04/18/19  Comprehensive metabolic panel  Result Value Ref Range   Sodium 141 135 - 145 mEq/L   Potassium 4.0 3.5 - 5.1 mEq/L   Chloride 104 96 - 112 mEq/L   CO2 30 19 - 32 mEq/L   Glucose, Bld 115 (H) 70 - 99 mg/dL   BUN 21 6 - 23 mg/dL   Creatinine, Ser 0.77 0.40 - 1.50 mg/dL   Total Bilirubin 0.7 0.2 - 1.2 mg/dL   Alkaline Phosphatase 63 39 - 117 U/L   AST 27 0 - 37 U/L   ALT 19 0 - 53 U/L   Total Protein 6.5 6.0 - 8.3 g/dL   Albumin 4.3 3.5 - 5.2 g/dL   Calcium 9.4 8.4 - 10.5 mg/dL   GFR 97.31 >60.00 mL/min  Lipid panel  Result Value Ref Range   Cholesterol 107 0 - 200 mg/dL   Triglycerides 100.0 0.0 - 149.0 mg/dL   HDL 37.30 (L) >39.00 mg/dL   VLDL 20.0 0.0 - 40.0 mg/dL   LDL Cholesterol 50 0 - 99 mg/dL   Total CHOL/HDL Ratio 3    NonHDL 69.77   TSH  Result Value Ref Range   TSH 6.05 (H) 0.35 - 4.50 uIU/mL  T4, free  Result Value Ref Range   Free T4 0.80 0.60 - 1.60 ng/dL  CBC with Differential/Platelet  Result Value Ref Range   WBC 4.0 4.0 - 10.5 K/uL   RBC 4.97 4.22 - 5.81 Mil/uL   Hemoglobin 16.7 13.0 - 17.0 g/dL   HCT 49.2 39.0 - 52.0 %   MCV 98.8 78.0 - 100.0 fl   MCHC 34.0 30.0 - 36.0 g/dL   RDW 12.8 11.5 - 15.5 %   Platelets 113.0 (L)  150.0 - 400.0 K/uL   Neutrophils Relative % 72.3 43.0 - 77.0 %   Lymphocytes Relative 13.1 12.0 - 46.0 %   Monocytes Relative 11.6 3.0 - 12.0 %   Eosinophils Relative 2.6 0.0 - 5.0 %   Basophils Relative 0.4 0.0 - 3.0 %   Neutro Abs 2.9 1.4 - 7.7 K/uL   Lymphs Abs 0.5 (L) 0.7 - 4.0 K/uL   Monocytes Absolute 0.5 0.1 - 1.0 K/uL   Eosinophils Absolute 0.1 0.0 - 0.7 K/uL   Basophils Absolute 0.0 0.0 - 0.1 K/uL  Microalbumin / creatinine urine ratio  Result Value Ref Range   Microalb, Ur 3.7 (H) 0.0 - 1.9 mg/dL   Creatinine,U 138.5 mg/dL   Microalb Creat Ratio 2.7 0.0 - 30.0 mg/g  Hemoglobin A1c  Result Value Ref Range   Hgb A1c MFr Bld 5.9 4.6  - 6.5 %   Assessment & Plan:   Problem List Items Addressed This Visit    Thrombocytopenia (HCC)    Chronic, stable. Continue to monitor.       Systolic murmur    Again heard today - reviewed recent echocardiogram without significant valvular disease.       SINUS BRADYCARDIA    Chronic issue. Pt asymptomatic with this. Will continue to monitor. He is on low dose carvedilol      S/P CABG x 3   Prediabetes    A1c remains stable. Difficult situation with limited PO intake.       Medicare annual wellness visit, subsequent - Primary    I have personally reviewed the Medicare Annual Wellness questionnaire and have noted 1. [name] medical and social history 2. Their use of alcohol, tobacco or illicit drugs 3. Their current medications and supplements 4. [name] functional ability including ADL's, fall risks, home safety risks and hearing or visual impairment. Cognitive function has been assessed and addressed as indicated.  5. Diet and physical activity 6. Evidence for depression or mood disorders The patients weight, height, BMI have been recorded in the chart. I have made referrals, counseling and provided education to the patient based on review of the above and I have provided the pt with a written personalized care plan for preventive services. Provider list updated.. See scanned questionairre as needed for further documentation. Reviewed preventative protocols and updated unless pt declined.       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 PO intake limited after this - regularly uses glucerna. No evidence of recurrence at this time.       Ischemic cardiomyopathy    Continue aspirin, statin.       Hypothyroidism, acquired    TSH remains elevated, free T4 remains normal. H/o radiation therapy to neck for tonsillar cancer.  Does endorse some fatigue and chronic constipation - will trial low dose levothyroxine. Discussed brand vs generic - he will try brand, monitoring for changes in formulation.       Relevant Medications   levothyroxine (SYNTHROID) 25 MCG tablet   Hyperlipidemia LDL goal <70    Chronic, stable on crestor. Continue.  The ASCVD Risk score [name]., et al., 2013) failed to calculate for the following reasons:   The valid total cholesterol range is 130 to 320 mg/dL       Hearing loss    Continue hearing aide use  Health maintenance examination    Preventative protocols reviewed and updated unless pt declined. Discussed healthy diet and lifestyle.       Essential hypertension    Chronic, stable. Continue current regimen.       Detrusor instability of bladder    Followed by urology. Did not respond significantly to PTNS.       Coronary artery disease involving native coronary artery of native heart without angina pectoris   Chronic systolic heart failure (HCC)   Chronic constipation    Endorses chronic issue, managed with high dose docusate.       Relevant Medications   docusate sodium (COLACE) 250 MG capsule   BPH associated with nocturia    Sees urology.       Bilateral carotid artery stenosis    H/o stenosis - update carotid US.       Relevant Orders   VAS US CAROTID   Advanced care planning/counseling discussion    Advanced directive discussion - working on this. Wife is HCPOA.        Other Visit Diagnoses    Need for influenza vaccination       Relevant Orders   Flu Vaccine QUAD High Dose(Fluad) (Completed)       Meds ordered this encounter  Medications  . docusate sodium (COLACE) 250 MG capsule    Sig: Takes total 600mg  /day  . levothyroxine (SYNTHROID) 25 MCG tablet    Sig: Take 1 tablet (25 mcg total) by mouth daily before breakfast.    Dispense:  30 tablet    Refill:  6  . tetanus & diphtheria toxoids, adult, (TENIVAC) 5-2 LFU injection     Sig: Inject 0.5 mLs into the muscle once for 1 dose.    Dispense:  0.5 mL    Refill:  0    Or equivalent, formulate based on insurance coverage   Orders Placed This Encounter  Procedures  . Flu Vaccine QUAD High Dose(Fluad)    Patient instructions: High dose flu shot today If interested, check with pharmacy about new 2 shot shingles series (shingrix).  I will send prescription for plain tetanus to price out at pharmacy.  Start low dose thyroid replacement (levothyroxine 14mcg daily), let me know how you do with this. Take on empty stomach with glass of water.  Bring me copy of advanced directives.  We will refer you for neck ultrasound Good to see you today, return as needed or in 6 months for follow up visit.  Follow up plan: Return in about 1 year (around 04/24/2020) for annual exam, prior fasting for blood work, medicare wellness visit.  [name], MD

## 2019-04-28 DIAGNOSIS — K5909 Other constipation: Secondary | ICD-10-CM | POA: Insufficient documentation

## 2019-04-28 DIAGNOSIS — Z0000 Encounter for general adult medical examination without abnormal findings: Secondary | ICD-10-CM | POA: Insufficient documentation

## 2019-04-28 MED ORDER — TETANUS-DIPHTHERIA TOXOIDS TD 5-2 LFU IM INJ: 0.5000 mL | INJECTION | Freq: Once | INTRAMUSCULAR | 0 refills | Status: AC

## 2019-04-28 NOTE — Assessment & Plan Note (Signed)
TSH remains elevated, free T4 remains normal. H/o radiation therapy to neck for tonsillar cancer. Does endorse some fatigue and chronic constipation - will trial low dose levothyroxine. Discussed brand vs generic - he will try brand, monitoring for changes in formulation.

## 2019-04-28 NOTE — Assessment & Plan Note (Deleted)
Preventative protocols reviewed and updated unless pt declined. Discussed healthy diet and lifestyle.  

## 2019-04-28 NOTE — Assessment & Plan Note (Signed)
Continue aspirin, statin.  

## 2019-04-28 NOTE — Assessment & Plan Note (Signed)
H/o stage IV R tonsillar cancer treated with chemo and radiation, unable to tolerate full chemo due to progressive hearing loss and tinnitus. PO intake limited after this - regularly uses glucerna. No evidence of recurrence at this time.

## 2019-04-28 NOTE — Assessment & Plan Note (Signed)
Again heard today - reviewed recent echocardiogram without significant valvular disease.

## 2019-04-28 NOTE — Assessment & Plan Note (Signed)
Chronic, stable. Continue to monitor.  

## 2019-04-28 NOTE — Assessment & Plan Note (Signed)
Sees urology

## 2019-04-28 NOTE — Assessment & Plan Note (Signed)
Chronic issue. Pt asymptomatic with this. Will continue to monitor. He is on low dose carvedilol

## 2019-04-28 NOTE — Assessment & Plan Note (Signed)
Endorses chronic issue, managed with high dose docusate.

## 2019-04-28 NOTE — Assessment & Plan Note (Signed)
Chronic, stable. Continue current regimen. 

## 2019-04-28 NOTE — Assessment & Plan Note (Signed)
Advanced directive discussion - working on this. Wife is HCPOA.

## 2019-04-28 NOTE — Assessment & Plan Note (Signed)
Preventative protocols reviewed and updated unless pt declined. Discussed healthy diet and lifestyle.  

## 2019-04-28 NOTE — Assessment & Plan Note (Signed)
A1c remains stable. Difficult situation with limited PO intake.

## 2019-04-28 NOTE — Assessment & Plan Note (Signed)
Continue hearing aide use.  

## 2019-04-28 NOTE — Assessment & Plan Note (Signed)
Followed by urology. Did not respond significantly to PTNS.

## 2019-04-28 NOTE — Assessment & Plan Note (Signed)
Chronic, stable on crestor. Continue.  The ASCVD Risk score [name]., et al., 2013) failed to calculate for the following reasons:   The valid total cholesterol range is 130 to 320 mg/dL

## 2019-04-28 NOTE — Assessment & Plan Note (Signed)

## 2019-04-28 NOTE — Assessment & Plan Note (Signed)
H/o stenosis - update carotid US.

## 2019-05-05 ENCOUNTER — Ambulatory Visit (INDEPENDENT_AMBULATORY_CARE_PROVIDER_SITE_OTHER): Payer: PPO

## 2019-05-05 ENCOUNTER — Other Ambulatory Visit: Payer: Self-pay

## 2019-05-05 DIAGNOSIS — I6523 Occlusion and stenosis of bilateral carotid arteries: Secondary | ICD-10-CM | POA: Diagnosis not present

## 2019-06-25 ENCOUNTER — Ambulatory Visit (INDEPENDENT_AMBULATORY_CARE_PROVIDER_SITE_OTHER): Payer: PPO | Admitting: Internal Medicine

## 2019-06-25 ENCOUNTER — Other Ambulatory Visit: Payer: Self-pay

## 2019-06-25 ENCOUNTER — Encounter: Payer: Self-pay | Admitting: Internal Medicine

## 2019-06-25 VITALS — BP 118/64 | HR 49 | Temp 97.2°F | Ht 74.0 in | Wt 209.0 lb

## 2019-06-25 DIAGNOSIS — E785 Hyperlipidemia, unspecified: Secondary | ICD-10-CM

## 2019-06-25 DIAGNOSIS — I2510 Atherosclerotic heart disease of native coronary artery without angina pectoris: Secondary | ICD-10-CM | POA: Diagnosis not present

## 2019-06-25 DIAGNOSIS — I255 Ischemic cardiomyopathy: Secondary | ICD-10-CM

## 2019-06-25 DIAGNOSIS — I10 Essential (primary) hypertension: Secondary | ICD-10-CM

## 2019-06-25 MED ORDER — FUROSEMIDE 20 MG PO TABS: 20.0000 mg | ORAL_TABLET | ORAL | 2 refills | Status: DC

## 2019-06-25 MED ORDER — AMLODIPINE BESYLATE 10 MG PO TABS: 10.0000 mg | ORAL_TABLET | Freq: Every day | ORAL | 3 refills | Status: DC

## 2019-06-25 NOTE — Progress Notes (Signed)
Follow-up Outpatient Visit Date: 06/25/2019  Primary Care Provider: [name], MD [place] [address]  Chief Complaint: Follow-up coronary artery disease and ischemic cardiomyopathy  HPI:  Phillip [name] is a 78 y.o. year-old male with history of coronary artery disease status post CABG in [id number], chronic systolic heart failure secondary to ischemic cardiomyopathy, hypertension, and hyperlipidemia, who presents for follow-up of coronary artery disease and ischemic cardiomyopathy.  I last spoke with him in early May via virtual visit, at which time he was doing well other than a few episodes of orthostatic lightheadedness.  We agreed to decrease furosemide to 20 mg every other day in order to minimize the risk for intravascular volume depletion.  Today, Phillip [name] reports feeling quite well.  He notes rare episodes of lightheadedness over the last few months.  He denies chest pain, shortness of breath, and palpitations.  He has been trying to walk but does not exercise as much as exercise classes through [facility] are currently on hold secondary to COVID-19.  He is tolerating his medications well.  He notes that his resting heart rates are often in the upper 40s to 50s.  Since our last visit, he has been started on low-dose levothyroxine due to elevated TSH.  --------------------------------------------------------------------------------------------------  Past Medical History:  Diagnosis Date  . Anxiety   . Atrial flutter ([place]) 01/07/2018   Post-op after CABG  . Cancer ([place])   . Coronary artery disease 2003   a. nonobstructive CAD by cath in 2003 and 2005; b.  Status post three-vessel CABG on 12/12/2017 with LIMA to LAD, sequential reverse SVG to OM1 and distal LCx  . Diverticulosis   . Hearing loss    bilateral  . Heart disease   . Heart murmur   . History of benign prostatic hypertrophy   . History of radiation therapy 01/31/10-03/22/10   right  tonsil/right neck node 7000 cGy 35 sessions, high risk lymph node volume 5940 cGy 35 sessions, low risk lymph node vol 5600 cGy 35 sessions  . 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 HTN (hypertension)   . Hyperlipidemia    diet controlled- taking medication d/t ensure drinking  . Hypothyroid 01/16/2013  . Ischemic cardiomyopathy    a. 10/2017: echo showing reduced EF of 40-45%, HK of the anterior, anteroseptal and apical myocardium with Grade 1 DD.   Marland Kitchen Neck pain   . Squamous cell carcinoma    right tonsil- 2011  . Thrombocytopenia ([place]) 02/02/2012   unclear etiology  . Tinnitus   . Tubular adenoma of colon    Past Surgical History:  Procedure Laterality Date  . artoscopic knee    . CARDIAC CATHETERIZATION  2003, 2005   40% blockage, two 30% blockages treated medically [facility])  . CARDIOVERSION N/A 02/12/2018   Procedure: CARDIOVERSION;  Surgeon: [name], MD;  Location: [facility];  Service: Cardiovascular;  Laterality: N/A;  . COLONOSCOPY  03/2015   TA x3, HP, melanosis coli, severe diverticulosis, rpt 3 yrs ([place])  . COLONOSCOPY  01/2019   fair prep, diverticulosis, f/u PRN ([place])  . CORONARY ARTERY BYPASS GRAFT N/A 12/12/2017   Procedure: CORONARY ARTERY BYPASS GRAFTING (CABG) x3 using the right greater saphenous vein harvested endoscopically and the left internal mammary artery. LIMA to LAD, SEQ SVG to OM1 & OM2;  Surgeon: [name], MD;  Location: [facility];  Service: Open Heart Surgery;  Laterality: N/A;  . DENTAL SURGERY     extractions  .  IR THORACENTESIS ASP PLEURAL SPACE W/IMG GUIDE  01/17/2018  . KNEE ARTHROSCOPY Left   . LEFT HEART CATH AND CORONARY ANGIOGRAPHY N/A 12/10/2017   Procedure: LEFT HEART CATH AND CORONARY ANGIOGRAPHY;  Surgeon: [name], MD;  Location: [facility];  Service: Cardiovascular;  Laterality: N/A;  . PEG PLACEMENT  02/2010  . TEE WITHOUT CARDIOVERSION N/A 12/12/2017   Procedure: TRANSESOPHAGEAL ECHOCARDIOGRAM (TEE);  Surgeon: [name], MD;  Location: [facility];  Service: Open Heart Surgery;  Laterality: N/A;  . TONSILLECTOMY    . triple bypass  11/2017  . VASECTOMY      Current Meds  Medication Sig  . amLODipine (NORVASC) 10 MG tablet Take 1 tablet (10 mg total) by mouth daily.  . Ascorbic Acid (VITAMIN C PO) Take 500 mg by mouth 2 (two) times a day.   Marland Kitchen aspirin EC 81 MG tablet Take 1 tablet (81 mg total) by mouth daily.  . Cholecalciferol (VITAMIN D3) 25 MCG (1000 UT) CAPS Take 1 capsule by mouth daily.  . clindamycin (CLEOCIN T) 1 % external solution APPLY 1ML ON THE AFFECTED AREAS ON THE SCALP ONCE TO TWICE DAILY AS NEEDED AS DIRECTED  . docusate sodium (COLACE) 250 MG capsule Takes total 600mg  /day  . furosemide (LASIX) 20 MG tablet Take 1 tablet (20 mg total) by mouth every other day.  . levothyroxine (SYNTHROID) 25 MCG tablet Take 1 tablet (25 mcg total) by mouth daily before breakfast.  . MAGNESIUM PO Take 1 tablet by mouth daily.  . Multiple Vitamin (MULTIVITAMIN) tablet Take 1 tablet by mouth daily.  . Multiple Vitamins-Minerals (ZINC PO) Take by mouth daily.  . nitroGLYCERIN (NITROSTAT) 0.4 MG SL tablet Place 1 tablet (0.4 mg total) under the tongue every 5 (five) minutes as needed for chest pain.  . Omega-3 Fatty Acids (OMEGA 3 PO) Take by mouth daily.  . Potassium 99 MG TABS Take 1 tablet by mouth daily.  . rosuvastatin (CRESTOR) 20 MG tablet Take 1 tablet (20 mg total) by mouth daily.  Marland Kitchen VITAMIN A PO Take 2,400 mcg by mouth daily.   . vitamin E 400 UNIT capsule Take 400 Units by mouth daily.  . zaleplon (SONATA) 10 MG capsule Take 1 capsule (10 mg total) by mouth at bedtime as needed for sleep.  Marland Kitchen Zinc 50 MG CAPS Take 50 mg by mouth daily.  . [DISCONTINUED] amLODipine (NORVASC) 10 MG tablet Take 1 tablet (10 mg total) by mouth daily.  . [DISCONTINUED] carvedilol (COREG) 3.125 MG tablet Take 1 tablet (3.125 mg total) by mouth 2 (two) times daily.  . [DISCONTINUED] furosemide (LASIX) 20 MG tablet Take  1 tablet (20 mg total) by mouth every other day.    Allergies: Ace inhibitors and Morphine and related  Social History   Tobacco Use  . Smoking status: Never Smoker  . Smokeless tobacco: Never Used  Substance Use Topics  . Alcohol use: Yes    Comment: 6 glasses of wine per year  . Drug use: No    Family History  Problem Relation Age of Onset  . Coronary artery disease Father   . Heart attack Father 63  . Heart disease Father   . Stroke Mother   . Colon cancer Neg Hx   . Esophageal cancer Neg Hx   . Rectal cancer Neg Hx   . Stomach cancer Neg Hx   . Kidney cancer Neg Hx   . Kidney failure Neg Hx   . Prostate cancer  Neg Hx   . Tuberculosis Neg Hx     Review of Systems: A 12-system review of systems was performed and was negative except as noted in the HPI.  --------------------------------------------------------------------------------------------------  Physical Exam: BP 118/64 (BP Location: Left Arm, Patient Position: Sitting, Cuff Size: Normal)   Pulse (!) 49   Temp (!) 97.2 F (36.2 C)   Ht 6\' 2"  (1.88 m)   Wt 209 lb (94.8 kg)   BMI 26.83 kg/m   General: NAD. HEENT: No conjunctival pallor or scleral icterus.  Facemask in place. Neck: Supple without lymphadenopathy, thyromegaly, JVD, or HJR. Lungs: Normal work of breathing. Clear to auscultation bilaterally without wheezes or crackles. Heart: Bradycardic but regular without murmurs, rubs, or gallops. Non-displaced PMI. Abd: Bowel sounds present. Soft, NT/ND without hepatosplenomegaly Ext: No lower extremity edema. Skin: Warm and dry without rash.  EKG: Sinus bradycardia (heart rate 49 bpm) with first-degree AV block (PR interval 218 ms), left axis deviation, LVH, and poor R wave progression.  Lab Results  Component Value Date   WBC 4.0 04/18/2019   HGB 16.7 04/18/2019   HCT 49.2 04/18/2019   MCV 98.8 04/18/2019   PLT 113.0 (L) 04/18/2019    Lab Results  Component Value Date   NA 141 04/18/2019    K 4.0 04/18/2019   CL 104 04/18/2019   CO2 30 04/18/2019   BUN 21 04/18/2019   CREATININE 0.77 04/18/2019   GLUCOSE 115 (H) 04/18/2019   ALT 19 04/18/2019    Lab Results  Component Value Date   CHOL 107 04/18/2019   HDL 37.30 (L) 04/18/2019   LDLCALC 50 04/18/2019   LDLDIRECT 101.0 10/13/2014   TRIG 100.0 04/18/2019   CHOLHDL 3 04/18/2019    --------------------------------------------------------------------------------------------------  ASSESSMENT AND PLAN: Coronary artery disease: No signs or symptoms to suggest worsening coronary insufficiency.  Continue current medications for secondary prevention, including aspirin 81 mg daily, rosuvastatin 20 mg daily, and amlodipine 10 mg daily.  I will discontinue carvedilol due to occasional orthostatic lightheadedness and bradycardia with first-degree AV block.  Ischemic cardiomyopathy: Phillip [name] appears euvolemic and well compensated with NYHA class I symptoms.  Due to resting bradycardia and first-degree AV block, we have agreed to discontinue carvedilol.  He is allergic to ACE inhibitors and we will defer rechallenging him with an ACE inhibitor or ARB.  He should continue furosemide 20 mg every other day.  Hypertension: Blood pressure well controlled.  As above, we will discontinue carvedilol.  Phillip [name] should continue amlodipine 10 mg daily.  Hyperlipidemia: LDL at goal on last check.  Continue rosuvastatin 20 mg daily.  Follow-up: Return to clinic in 6 months.  [name], MD 06/25/2019 1:08 PM

## 2019-06-25 NOTE — Patient Instructions (Signed)
Medication Instructions:  Your physician has recommended you make the following change in your medication:  1- STOP Carvedilol.   *If you need a refill on your cardiac medications before your next appointment, please call your pharmacy*  Lab Work: none If you have labs (blood work) drawn today and your tests are completely normal, you will receive your results only by: Marland Kitchen MyChart Message (if you have MyChart) OR . A paper copy in the mail If you have any lab test that is abnormal or we need to change your treatment, we will call you to review the results.  Testing/Procedures: none  Follow-Up: At [facility], you and your health needs are our priority.  As part of our continuing mission to provide you with exceptional heart care, we have created designated Provider Care Teams.  These Care Teams include your primary Cardiologist (physician) and Advanced Practice Providers (APPs -  Physician Assistants and Nurse Practitioners) who all work together to provide you with the care you need, when you need it.  Your next appointment:   6 months  The format for your next appointment:   In Person  Provider:    You may see [name], MD or one of the following Advanced Practice Providers on your designated Care Team:    Murray Hodgkins, NP  Christell Faith, PA-C  Marrianne Mood, PA-C

## 2019-08-06 DIAGNOSIS — H2513 Age-related nuclear cataract, bilateral: Secondary | ICD-10-CM | POA: Diagnosis not present

## 2019-08-07 ENCOUNTER — Telehealth: Payer: Self-pay

## 2019-08-07 DIAGNOSIS — E039 Hypothyroidism, unspecified: Secondary | ICD-10-CM

## 2019-08-07 NOTE — Telephone Encounter (Signed)
Pt left v/m that at 04/25/19 [place] pt was started on thyroid med and pt wants to know if could repeat thyroid labs to see if any improvement in the thyroid lab test; pt request cb. 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 Pt request cb on 08/08/19.

## 2019-08-08 ENCOUNTER — Other Ambulatory Visit (INDEPENDENT_AMBULATORY_CARE_PROVIDER_SITE_OTHER): Payer: PPO

## 2019-08-08 DIAGNOSIS — E039 Hypothyroidism, unspecified: Secondary | ICD-10-CM

## 2019-08-08 NOTE — Telephone Encounter (Signed)
I Agree. plz schedule lab visit for thyroid function check. Labs ordered.

## 2019-08-08 NOTE — Addendum Note (Signed)
Addended by: Ellamae Sia on: 08/08/2019 03:48 PM   Modules accepted: Orders

## 2019-08-08 NOTE — Telephone Encounter (Signed)
Spoke with pt scheduling lab visit today at 4:00.

## 2019-08-09 LAB — T4, FREE: Free T4: 0.9 ng/dL (ref 0.8–1.8)

## 2019-08-09 LAB — TSH: TSH: 4.09 mIU/L (ref 0.40–4.50)

## 2019-09-02 DIAGNOSIS — D0462 Carcinoma in situ of skin of left upper limb, including shoulder: Secondary | ICD-10-CM | POA: Diagnosis not present

## 2019-09-02 DIAGNOSIS — C4492 Squamous cell carcinoma of skin, unspecified: Secondary | ICD-10-CM

## 2019-09-02 DIAGNOSIS — C4442 Squamous cell carcinoma of skin of scalp and neck: Secondary | ICD-10-CM | POA: Diagnosis not present

## 2019-09-02 DIAGNOSIS — L578 Other skin changes due to chronic exposure to nonionizing radiation: Secondary | ICD-10-CM | POA: Diagnosis not present

## 2019-09-02 DIAGNOSIS — D692 Other nonthrombocytopenic purpura: Secondary | ICD-10-CM | POA: Diagnosis not present

## 2019-09-02 HISTORY — DX: Squamous cell carcinoma of skin, unspecified: C44.92

## 2019-09-12 ENCOUNTER — Ambulatory Visit (INDEPENDENT_AMBULATORY_CARE_PROVIDER_SITE_OTHER): Payer: PPO | Admitting: Family Medicine

## 2019-09-12 ENCOUNTER — Other Ambulatory Visit: Payer: Self-pay

## 2019-09-12 ENCOUNTER — Encounter: Payer: Self-pay | Admitting: Family Medicine

## 2019-09-12 VITALS — BP 150/76 | HR 74 | Temp 97.5°F | Ht 74.0 in | Wt 207.4 lb

## 2019-09-12 DIAGNOSIS — M79604 Pain in right leg: Secondary | ICD-10-CM | POA: Diagnosis not present

## 2019-09-12 NOTE — Assessment & Plan Note (Addendum)
Story suspicious for R sciatica/piriformis syndrome however on exam more consistent with trochanteric bursitis with possible iliotibial band syndrome (pain radiates to lateral knee). Recommend supportive care with voltaren gel, tylenol/ibuprofen, and provided with exercises from [facility] pt advisor. Will also refer for formal PT course. He does have known h/o L5 HNP and possible radiculopathy by lumbar CT 2016 - low threshold to further evaluate this if progressive symptoms or not improving as expected. Also discussed SM referral if no better with PT.

## 2019-09-12 NOTE — Progress Notes (Addendum)
This visit was conducted in person.  BP (!) 150/76 (BP Location: Right Arm, Patient Position: Sitting, Cuff Size: Normal)   Pulse 74   Temp (!) 97.5 F (36.4 C) (Temporal)   Ht 6\' 2"  (1.88 m)   Wt 207 lb 6 oz (94.1 kg)   SpO2 95%   BMI 26.63 kg/m   BP Readings from Last 3 Encounters:  09/12/19 (!) 150/76  06/25/19 118/64  04/25/19 118/70    CC: R sciatica pain Subjective:    Patient ID: Phillip [name], male    DOB: Apr 26, 1941, 79 y.o.   MRN: [id number]  HPI: Phillip [name] is a 79 y.o. male presenting on 09/12/2019 for Sciatica (C/o right side sciatic nerve pain.  Started about 1 mo ago.  H/o sciatica- seen by Dr. Earnie Larsson [facility] )   1 mo h/o R lateral hip pain that radiates down lateral leg to lateral ankle, not affecting foot. Treats with tylenol 1000mg  and blue emu cream at night time. Has also tried ibuprofen. Worse with prolonged walking or getting in/out of the car. No numbness/weakness of leg, saddle anesthesia, new or worsening bowel/bladder incontinence, or fevers.  Activity limiting, affecting golf swing and ambulation.   Years ago saw Dr Annette Stable for for similar issue, thinks treated with physical therapy with benefit.   Chronic bladder issue for the past year - leaking with incontinence, wears depends regularly. Nocturia x5-6. PTNS didn't help.      Relevant past medical, surgical, family and social history reviewed and updated as indicated. Interim medical history since our last visit reviewed. Allergies and medications reviewed and updated. Outpatient Medications Prior to Visit  Medication Sig Dispense Refill  . amLODipine (NORVASC) 10 MG tablet Take 1 tablet (10 mg total) by mouth daily. 90 tablet 3  . Ascorbic Acid (VITAMIN C PO) Take 500 mg by mouth 2 (two) times a day.     Marland Kitchen aspirin EC 81 MG tablet Take 1 tablet (81 mg total) by mouth daily. 90 tablet 3  . Cholecalciferol (VITAMIN D3) 25 MCG (1000 UT) CAPS Take 1 capsule by mouth daily.      Marland Kitchen docusate sodium (COLACE) 250 MG capsule Takes total 600mg  /day    . furosemide (LASIX) 20 MG tablet Take 1 tablet (20 mg total) by mouth every other day. 45 tablet 2  . levothyroxine (SYNTHROID) 25 MCG tablet Take 1 tablet (25 mcg total) by mouth daily before breakfast. 30 tablet 6  . MAGNESIUM PO Take 1 tablet by mouth daily.    . Multiple Vitamin (MULTIVITAMIN) tablet Take 1 tablet by mouth daily.    . Multiple Vitamins-Minerals (ZINC PO) Take by mouth daily.    . nitroGLYCERIN (NITROSTAT) 0.4 MG SL tablet Place 1 tablet (0.4 mg total) under the tongue every 5 (five) minutes as needed for chest pain. 30 tablet prn  . Omega-3 Fatty Acids (OMEGA 3 PO) Take by mouth daily.    . Potassium 99 MG TABS Take 1 tablet by mouth daily.    . rosuvastatin (CRESTOR) 20 MG tablet Take 1 tablet (20 mg total) by mouth daily. 90 tablet 2  . VITAMIN A PO Take 2,400 mcg by mouth daily.     . vitamin E 400 UNIT capsule Take 400 Units by mouth daily.    . zaleplon (SONATA) 10 MG capsule Take 1 capsule (10 mg total) by mouth at bedtime as needed for sleep. 30 capsule 0  . Zinc 50 MG CAPS Take 50 mg  by mouth daily.    . clindamycin (CLEOCIN T) 1 % external solution APPLY 1ML ON THE AFFECTED AREAS ON THE SCALP ONCE TO TWICE DAILY AS NEEDED AS DIRECTED     No facility-administered medications prior to visit.     Per HPI unless specifically indicated in ROS section below Review of Systems Objective:    BP (!) 150/76 (BP Location: Right Arm, Patient Position: Sitting, Cuff Size: Normal)   Pulse 74   Temp (!) 97.5 F (36.4 C) (Temporal)   Ht 6\' 2"  (1.88 m)   Wt 207 lb 6 oz (94.1 kg)   SpO2 95%   BMI 26.63 kg/m   Wt Readings from Last 3 Encounters:  09/12/19 207 lb 6 oz (94.1 kg)  06/25/19 209 lb (94.8 kg)  04/25/19 207 lb 3 oz (94 kg)    Physical Exam Vitals and nursing note reviewed.  Constitutional:      Appearance: Normal appearance. He is not ill-appearing.  Musculoskeletal:        General:  Tenderness present. Normal range of motion.     Comments:  No pain midline spine No paraspinous mm tenderness Neg SLR bilaterally. No pain with int/ext rotation at hip. Neg FABER. Tender to palpation at R GTB and sciatic notch as well as down R lateral thigh. No pain at bilateral SIJ.   Neurological:     Mental Status: He is alert.     Comments: 5/5 strength BLE  Psychiatric:        Mood and Affect: Mood normal.        Behavior: Behavior normal.       Reviewed lumbar spine CT from 08/2014 showing R paracentral disc herniation at L4/5 likely exerting mass effect upon R L5 root at lateral recess.  Assessment & Plan:  This visit occurred during the SARS-CoV-2 public health emergency.  Safety protocols were in place, including screening questions prior to the visit, additional usage of staff PPE, and extensive cleaning of exam room while observing appropriate contact time as indicated for disinfecting solutions.  Covid vaccine wait-list info provided.  Problem List Items Addressed This Visit    Right leg pain - Primary    Story suspicious for R sciatica/piriformis syndrome however on exam more consistent with trochanteric bursitis with possible iliotibial band syndrome (pain radiates to lateral knee). Recommend supportive care with voltaren gel, tylenol/ibuprofen, and provided with exercises from [facility] pt advisor. Will also refer for formal PT course. He does have known h/o L5 HNP and possible radiculopathy by lumbar CT 2016 - low threshold to further evaluate this if progressive symptoms or not improving as expected. Also discussed SM referral if no better with PT.       Relevant Orders   Ambulatory referral to Physical Therapy       No orders of the defined types were placed in this encounter.  Orders Placed This Encounter  Procedures  . Ambulatory referral to Physical Therapy    Referral Priority:   Routine    Referral Type:   Physical Medicine    Referral Reason:   Specialty  Services Required    Requested Specialty:   Physical Therapy    Number of Visits Requested:   1    Patient Instructions  I think you have a combination of bursitis and iliotibial band syndrome - treat with stretching exercises provided today. May continue tylenol and blue emu, pick up some over the counter voltaren gel for lateral hip as well.  We will  refer you for formal physical therapy course. If no better with this, return to see Dr Lorelei Pont our sports medicine doctor forevaluation and to consider shot into hip bursa.   Look below for wait list for [place] covid vaccine [email or web address]   Follow up plan: Return if symptoms worsen or fail to improve.  [name], MD

## 2019-09-12 NOTE — Patient Instructions (Addendum)
I think you have a combination of bursitis and iliotibial band syndrome - treat with stretching exercises provided today. May continue tylenol and blue emu, pick up some over the counter voltaren gel for lateral hip as well.  We will refer you for formal physical therapy course. If no better with this, return to see Dr Lorelei Pont our sports medicine doctor forevaluation and to consider shot into hip bursa.   Look below for wait list for  covid vaccine [email or web address]

## 2019-09-13 NOTE — Addendum Note (Signed)
Addended by: [name] on: 09/13/2019 09:27 AM   Modules accepted: Orders

## 2019-10-02 DIAGNOSIS — M7061 Trochanteric bursitis, right hip: Secondary | ICD-10-CM | POA: Diagnosis not present

## 2019-10-02 DIAGNOSIS — M79604 Pain in right leg: Secondary | ICD-10-CM | POA: Diagnosis not present

## 2019-10-02 DIAGNOSIS — M25551 Pain in right hip: Secondary | ICD-10-CM | POA: Diagnosis not present

## 2019-10-08 DIAGNOSIS — M7061 Trochanteric bursitis, right hip: Secondary | ICD-10-CM | POA: Diagnosis not present

## 2019-10-08 DIAGNOSIS — M25551 Pain in right hip: Secondary | ICD-10-CM | POA: Diagnosis not present

## 2019-10-08 DIAGNOSIS — M79604 Pain in right leg: Secondary | ICD-10-CM | POA: Diagnosis not present

## 2019-10-10 DIAGNOSIS — M7061 Trochanteric bursitis, right hip: Secondary | ICD-10-CM | POA: Diagnosis not present

## 2019-10-10 DIAGNOSIS — M79604 Pain in right leg: Secondary | ICD-10-CM | POA: Diagnosis not present

## 2019-10-10 DIAGNOSIS — M25551 Pain in right hip: Secondary | ICD-10-CM | POA: Diagnosis not present

## 2019-10-13 DIAGNOSIS — M7061 Trochanteric bursitis, right hip: Secondary | ICD-10-CM | POA: Diagnosis not present

## 2019-10-13 DIAGNOSIS — M79604 Pain in right leg: Secondary | ICD-10-CM | POA: Diagnosis not present

## 2019-10-13 DIAGNOSIS — M25551 Pain in right hip: Secondary | ICD-10-CM | POA: Diagnosis not present

## 2019-10-17 DIAGNOSIS — M25551 Pain in right hip: Secondary | ICD-10-CM | POA: Diagnosis not present

## 2019-10-17 DIAGNOSIS — M7061 Trochanteric bursitis, right hip: Secondary | ICD-10-CM | POA: Diagnosis not present

## 2019-10-17 DIAGNOSIS — M79604 Pain in right leg: Secondary | ICD-10-CM | POA: Diagnosis not present

## 2019-10-22 DIAGNOSIS — M79604 Pain in right leg: Secondary | ICD-10-CM | POA: Diagnosis not present

## 2019-10-22 DIAGNOSIS — M7061 Trochanteric bursitis, right hip: Secondary | ICD-10-CM | POA: Diagnosis not present

## 2019-10-22 DIAGNOSIS — M25551 Pain in right hip: Secondary | ICD-10-CM | POA: Diagnosis not present

## 2019-10-23 ENCOUNTER — Ambulatory Visit: Payer: PPO | Admitting: Family Medicine

## 2019-10-24 ENCOUNTER — Other Ambulatory Visit: Payer: Self-pay

## 2019-10-24 ENCOUNTER — Encounter: Payer: Self-pay | Admitting: Family Medicine

## 2019-10-24 ENCOUNTER — Ambulatory Visit (INDEPENDENT_AMBULATORY_CARE_PROVIDER_SITE_OTHER): Payer: PPO | Admitting: Family Medicine

## 2019-10-24 VITALS — BP 124/72 | HR 60 | Temp 98.2°F | Ht 74.0 in | Wt 209.1 lb

## 2019-10-24 DIAGNOSIS — R011 Cardiac murmur, unspecified: Secondary | ICD-10-CM

## 2019-10-24 DIAGNOSIS — M79604 Pain in right leg: Secondary | ICD-10-CM

## 2019-10-24 DIAGNOSIS — I10 Essential (primary) hypertension: Secondary | ICD-10-CM

## 2019-10-24 DIAGNOSIS — F5101 Primary insomnia: Secondary | ICD-10-CM

## 2019-10-24 DIAGNOSIS — M25551 Pain in right hip: Secondary | ICD-10-CM | POA: Diagnosis not present

## 2019-10-24 DIAGNOSIS — E039 Hypothyroidism, unspecified: Secondary | ICD-10-CM | POA: Diagnosis not present

## 2019-10-24 DIAGNOSIS — M7061 Trochanteric bursitis, right hip: Secondary | ICD-10-CM | POA: Diagnosis not present

## 2019-10-24 MED ORDER — ZALEPLON 10 MG PO CAPS: 10.0000 mg | ORAL_CAPSULE | Freq: Every evening | ORAL | 0 refills | Status: AC | PRN

## 2019-10-24 NOTE — Assessment & Plan Note (Signed)
Improving with PT

## 2019-10-24 NOTE — Progress Notes (Signed)
This visit was conducted in person.  BP 124/72 (BP Location: Left Arm, Patient Position: Sitting, Cuff Size: Normal)   Pulse 60   Temp 98.2 F (36.8 C) (Temporal)   Ht 6\' 2"  (1.88 m)   Wt 209 lb 1 oz (94.8 kg)   SpO2 96%   BMI 26.84 kg/m    CC: 6 mo f/u visit Subjective:    Patient ID: Phillip [name], male    DOB: [birth year], 79 y.o.   MRN: [id number]  HPI: Phillip [name] is a 79 y.o. male presenting on 10/24/2019 for Follow-up (Here for 6 mo f/u.)   He has received 1st pfizer vaccine. 2nd dose planned today.   Saw Dr End for CAD with ischemic CM 06/2019, now off carvedilol. Notes BP runs better after exercising regularly. He takes amlodipine at night time.   Hypothyroidism - last visit we started levothyroxine 26mcg daily - tolerating well. Still notes some trouble with low energy. No cold intolerance, constipation or weight gain.   R leg pain - thought R trochanteric bursitis with possible IT band syndrome - referred for formal PT. Known h/o L5 HNP with radiculopathy by lumbar CT 2016. He has significantly benefited from PT. He is golfing and walking more regularly.      Relevant past medical, surgical, family and social history reviewed and updated as indicated. Interim medical history since our last visit reviewed. Allergies and medications reviewed and updated. Outpatient Medications Prior to Visit  Medication Sig Dispense Refill  . amLODipine (NORVASC) 10 MG tablet Take 1 tablet (10 mg total) by mouth daily. 90 tablet 3  . Ascorbic Acid (VITAMIN C PO) Take 500 mg by mouth 2 (two) times a day.     Marland Kitchen aspirin EC 81 MG tablet Take 1 tablet (81 mg total) by mouth daily. 90 tablet 3  . Cholecalciferol (VITAMIN D3) 25 MCG (1000 UT) CAPS Take 1 capsule by mouth daily.    Marland Kitchen docusate sodium (COLACE) 250 MG capsule Takes total 600mg  /day    . levothyroxine (SYNTHROID) 25 MCG tablet Take 1 tablet (25 mcg total) by mouth daily before breakfast. 30 tablet 6  . MAGNESIUM PO Take 1 tablet by  mouth daily.    . Multiple Vitamin (MULTIVITAMIN) tablet Take 1 tablet by mouth daily.    . Multiple Vitamins-Minerals (ZINC PO) Take by mouth daily.    . nitroGLYCERIN (NITROSTAT) 0.4 MG SL tablet Place 1 tablet (0.4 mg total) under the tongue every 5 (five) minutes as needed for chest pain. 30 tablet prn  . Omega-3 Fatty Acids (OMEGA 3 PO) Take by mouth daily.    . Potassium 99 MG TABS Take 1 tablet by mouth daily.    . rosuvastatin (CRESTOR) 20 MG tablet Take 1 tablet (20 mg total) by mouth daily. 90 tablet 2  . VITAMIN A PO Take 2,400 mcg by mouth daily.     . vitamin E 400 UNIT capsule Take 400 Units by mouth daily.    . Zinc 50 MG CAPS Take 50 mg by mouth daily.    . zaleplon (SONATA) 10 MG capsule Take 1 capsule (10 mg total) by mouth at bedtime as needed for sleep. 30 capsule 0  . furosemide (LASIX) 20 MG tablet Take 1 tablet (20 mg total) by mouth every other day. 45 tablet 2   No facility-administered medications prior to visit.     Per HPI unless specifically indicated in ROS section below Review of Systems Objective:    BP 124/72 (  BP Location: Left Arm, Patient Position: Sitting, Cuff Size: Normal)   Pulse 60   Temp 98.2 F (36.8 C) (Temporal)   Ht 6\' 2"  (1.88 m)   Wt 209 lb 1 oz (94.8 kg)   SpO2 96%   BMI 26.84 kg/m   Wt Readings from Last 3 Encounters:  10/24/19 209 lb 1 oz (94.8 kg)  09/12/19 207 lb 6 oz (94.1 kg)  06/25/19 209 lb (94.8 kg)    Physical Exam Vitals and nursing note reviewed.  Constitutional:      Appearance: Normal appearance. He is not ill-appearing.  Neck:     Comments: Woody consistency of neck Cardiovascular:     Rate and Rhythm: Normal rate and regular rhythm.     Pulses: Normal pulses.     Heart sounds: Murmur (1/6 faint systolic) present.  Pulmonary:     Effort: Pulmonary effort is normal. No respiratory distress.     Breath sounds: Normal breath sounds. No wheezing, rhonchi or rales.  Musculoskeletal:     Cervical back: Normal  range of motion.  Neurological:     Mental Status: He is alert.  Psychiatric:        Mood and Affect: Mood normal.        Behavior: Behavior normal.       Results for orders placed or performed in visit on 08/08/19  TSH  Result Value Ref Range   TSH 4.09 0.40 - 4.50 mIU/L  T4, Free  Result Value Ref Range   Free T4 0.9 0.8 - 1.8 ng/dL   Assessment & Plan:  This visit occurred during the SARS-CoV-2 public health emergency.  Safety protocols were in place, including screening questions prior to the visit, additional usage of staff PPE, and extensive cleaning of exam room while observing appropriate contact time as indicated for disinfecting solutions.   Problem List Items Addressed This Visit    Systolic murmur    Minimal recent echo reassuringly without valvular disease      Right leg pain    Improving with PT.       Insomnia    Refill sonata - infrequent use.       Relevant Medications   zaleplon (SONATA) 10 MG capsule   Hypothyroidism, acquired    Stable period on low dose levothyroxine. Continue current regimen.       Essential hypertension - Primary    Chronic, stable. Tends to do better after exercising.           Meds ordered this encounter  Medications  . zaleplon (SONATA) 10 MG capsule    Sig: Take 1 capsule (10 mg total) by mouth at bedtime as needed for sleep.    Dispense:  30 capsule    Refill:  0   No orders of the defined types were placed in this encounter.   Follow up plan: Return in about 6 months (around 04/25/2020) for annual exam, prior fasting for blood work.  [name], MD

## 2019-10-24 NOTE — Assessment & Plan Note (Signed)
Stable period on low dose levothyroxine. Continue current regimen.

## 2019-10-24 NOTE — Patient Instructions (Signed)
You are doing well today I'm glad leg is doing better Return as needed or in 6 months for physical.

## 2019-10-24 NOTE — Assessment & Plan Note (Signed)
Refill sonata - infrequent use.

## 2019-10-24 NOTE — Assessment & Plan Note (Signed)
Minimal recent echo reassuringly without valvular disease

## 2019-10-24 NOTE — Assessment & Plan Note (Signed)
Chronic, stable. Tends to do better after exercising.

## 2019-10-27 DIAGNOSIS — M79604 Pain in right leg: Secondary | ICD-10-CM | POA: Diagnosis not present

## 2019-10-27 DIAGNOSIS — M25551 Pain in right hip: Secondary | ICD-10-CM | POA: Diagnosis not present

## 2019-10-27 DIAGNOSIS — M7061 Trochanteric bursitis, right hip: Secondary | ICD-10-CM | POA: Diagnosis not present

## 2019-10-29 DIAGNOSIS — M7061 Trochanteric bursitis, right hip: Secondary | ICD-10-CM | POA: Diagnosis not present

## 2019-10-29 DIAGNOSIS — M79604 Pain in right leg: Secondary | ICD-10-CM | POA: Diagnosis not present

## 2019-10-29 DIAGNOSIS — M25551 Pain in right hip: Secondary | ICD-10-CM | POA: Diagnosis not present

## 2019-10-30 ENCOUNTER — Other Ambulatory Visit: Payer: Self-pay | Admitting: Internal Medicine

## 2019-11-03 DIAGNOSIS — M79604 Pain in right leg: Secondary | ICD-10-CM | POA: Diagnosis not present

## 2019-11-03 DIAGNOSIS — M7061 Trochanteric bursitis, right hip: Secondary | ICD-10-CM | POA: Diagnosis not present

## 2019-11-03 DIAGNOSIS — M25551 Pain in right hip: Secondary | ICD-10-CM | POA: Diagnosis not present

## 2019-11-05 DIAGNOSIS — M25551 Pain in right hip: Secondary | ICD-10-CM | POA: Diagnosis not present

## 2019-11-05 DIAGNOSIS — M79604 Pain in right leg: Secondary | ICD-10-CM | POA: Diagnosis not present

## 2019-11-05 DIAGNOSIS — M7061 Trochanteric bursitis, right hip: Secondary | ICD-10-CM | POA: Diagnosis not present

## 2019-11-06 ENCOUNTER — Other Ambulatory Visit: Payer: Self-pay | Admitting: Family Medicine

## 2019-11-10 DIAGNOSIS — M7061 Trochanteric bursitis, right hip: Secondary | ICD-10-CM | POA: Diagnosis not present

## 2019-11-10 DIAGNOSIS — M79604 Pain in right leg: Secondary | ICD-10-CM | POA: Diagnosis not present

## 2019-11-10 DIAGNOSIS — M25551 Pain in right hip: Secondary | ICD-10-CM | POA: Diagnosis not present

## 2019-11-12 DIAGNOSIS — M7061 Trochanteric bursitis, right hip: Secondary | ICD-10-CM | POA: Diagnosis not present

## 2019-11-12 DIAGNOSIS — M25551 Pain in right hip: Secondary | ICD-10-CM | POA: Diagnosis not present

## 2019-11-12 DIAGNOSIS — M79604 Pain in right leg: Secondary | ICD-10-CM | POA: Diagnosis not present

## 2019-11-24 DIAGNOSIS — M79604 Pain in right leg: Secondary | ICD-10-CM | POA: Diagnosis not present

## 2019-11-24 DIAGNOSIS — M7061 Trochanteric bursitis, right hip: Secondary | ICD-10-CM | POA: Diagnosis not present

## 2019-11-24 DIAGNOSIS — M25551 Pain in right hip: Secondary | ICD-10-CM | POA: Diagnosis not present

## 2019-11-28 DIAGNOSIS — M25551 Pain in right hip: Secondary | ICD-10-CM | POA: Diagnosis not present

## 2019-11-28 DIAGNOSIS — M79604 Pain in right leg: Secondary | ICD-10-CM | POA: Diagnosis not present

## 2019-11-28 DIAGNOSIS — M7061 Trochanteric bursitis, right hip: Secondary | ICD-10-CM | POA: Diagnosis not present

## 2019-12-01 ENCOUNTER — Ambulatory Visit: Payer: PPO | Admitting: Dermatology

## 2019-12-01 ENCOUNTER — Encounter: Payer: Self-pay | Admitting: Dermatology

## 2019-12-01 ENCOUNTER — Other Ambulatory Visit: Payer: Self-pay

## 2019-12-01 DIAGNOSIS — Z86007 Personal history of in-situ neoplasm of skin: Secondary | ICD-10-CM

## 2019-12-01 DIAGNOSIS — D239 Other benign neoplasm of skin, unspecified: Secondary | ICD-10-CM | POA: Diagnosis not present

## 2019-12-01 DIAGNOSIS — D1800 Hemangioma unspecified site: Secondary | ICD-10-CM

## 2019-12-01 DIAGNOSIS — L817 Pigmented purpuric dermatosis: Secondary | ICD-10-CM

## 2019-12-01 DIAGNOSIS — D229 Melanocytic nevi, unspecified: Secondary | ICD-10-CM

## 2019-12-01 DIAGNOSIS — L570 Actinic keratosis: Secondary | ICD-10-CM

## 2019-12-01 DIAGNOSIS — L814 Other melanin hyperpigmentation: Secondary | ICD-10-CM

## 2019-12-01 DIAGNOSIS — Z1283 Encounter for screening for malignant neoplasm of skin: Secondary | ICD-10-CM | POA: Diagnosis not present

## 2019-12-01 DIAGNOSIS — D235 Other benign neoplasm of skin of trunk: Secondary | ICD-10-CM | POA: Diagnosis not present

## 2019-12-01 DIAGNOSIS — Z85828 Personal history of other malignant neoplasm of skin: Secondary | ICD-10-CM

## 2019-12-01 DIAGNOSIS — L820 Inflamed seborrheic keratosis: Secondary | ICD-10-CM

## 2019-12-01 DIAGNOSIS — D692 Other nonthrombocytopenic purpura: Secondary | ICD-10-CM | POA: Diagnosis not present

## 2019-12-01 DIAGNOSIS — L821 Other seborrheic keratosis: Secondary | ICD-10-CM

## 2019-12-01 DIAGNOSIS — D489 Neoplasm of uncertain behavior, unspecified: Secondary | ICD-10-CM

## 2019-12-01 DIAGNOSIS — L578 Other skin changes due to chronic exposure to nonionizing radiation: Secondary | ICD-10-CM | POA: Diagnosis not present

## 2019-12-01 NOTE — Patient Instructions (Signed)
Wound Care Instructions  1. Cleanse wound gently with soap and water once a day then pat dry with clean gauze. Apply a thing coat of Petrolatum (petroleum jelly, "Vaseline") over the wound (unless you have an allergy to this). We recommend that you use a new, sterile tube of Vaseline. Do not pick or remove scabs. Do not remove the yellow or white "healing tissue" from the base of the wound.  2. Cover the wound with fresh, clean, nonstick gauze and secure with paper tape. You may use Band-Aids in place of gauze and tape if the would is small enough, but would recommend trimming much of the tape off as there is often too much. Sometimes Band-Aids can irritate the skin.  3. You should call the office for your biopsy report after 1 week if you have not already been contacted.  4. If you experience any problems, such as abnormal amounts of bleeding, swelling, significant bruising, significant pain, or evidence of infection, please call the office immediately.  5. FOR ADULT SURGERY PATIENTS: If you need something for pain relief you may take 1 extra strength Tylenol (acetaminophen) AND 2 Ibuprofen (200mg  each) together every 4 hours as needed for pain. (do not take these if you are allergic to them or if you have a reason you should not take them.) Typically, you may only need pain medication for 1 to 3 days.    Liquid nitrogen was applied for 10-12 seconds to the skin lesion and the expected blistering or scabbing reaction explained. Do not pick at the area. Patient reminded to expect hypopigmented scars from the procedure. Return if lesion fails to fully resolve.

## 2019-12-01 NOTE — Progress Notes (Signed)
Follow-Up Visit   Subjective  Phillip [name] is a 79 y.o. male who presents for the following: Annual Exam (TBSE, go over SCC on crown, and SCCis onL dist. lat deltoid) and scaly spots (Right calf and Right chest have been there for a good while, do not itch or hurt).  Skin cancer screening performed today.  The following portions of the chart were reviewed this encounter and updated as appropriate: Tobacco  Allergies  Meds  Problems  Med Hx  Surg Hx  Fam Hx      Review of Systems: No other skin or systemic complaints.  Objective  Well appearing patient in no apparent distress; mood and affect are within normal limits.  A full examination was performed including scalp, head, eyes, ears, nose, lips, neck, chest, axillae, abdomen, back, buttocks, bilateral upper extremities, bilateral lower extremities, hands, feet, fingers, toes, fingernails, and toenails. All findings within normal limits unless otherwise noted below.  Objective  Left distal lateral deltoid inferior edge of SCCis: Erythematous thin papules/macules with gritty scale.   Objective  Crown Scalp: Well healed scar with no evidence of recurrence, no lymphadenopathy.  Treated with ED&C at time of removal  Objective  Left distal lateral deltoid:  Well healed scar with no evidence of recurrence, no lymphadenopathy. Treated with ED&C at time of removal  Objective  Right superior pectoral: 0.4 cm pink papule  Objective  Bilateral lower legs: Irregular patches of brown pigmented patches with stasis changes  Assessment & Plan    AK (actinic keratosis) Left distal lateral deltoid inferior edge of SCCis  Destruction of lesion - Left distal lateral deltoid inferior edge of SCCis Complexity: simple   Destruction method: cryotherapy   Informed consent: discussed and consent obtained   Timeout:  patient name, date of birth, surgical site, and procedure verified Lesion destroyed using liquid nitrogen: Yes   Region  frozen until ice ball extended beyond lesion: Yes   Outcome: patient tolerated procedure well with no complications   Post-procedure details: wound care instructions given    History of SCC (squamous cell carcinoma) of skin Crown Scalp  Clear. Observe for recurrence. Call clinic for new or changing lesions.  Recommend regular skin exams, daily broad-spectrum spf 30+ sunscreen use, and photoprotection.     History of squamous cell carcinoma in situ (SCCIS) of skin Left distal lateral deltoid  Clear. Observe for recurrence. Call clinic for new or changing lesions.  Recommend regular skin exams, daily broad-spectrum spf 30+ sunscreen use, and photoprotection.     Inflamed seborrheic keratosis Right lateral calf  Destruction of lesion - Right lateral calf Complexity: simple   Destruction method: cryotherapy   Informed consent: discussed and consent obtained   Timeout:  patient name, date of birth, surgical site, and procedure verified Lesion destroyed using liquid nitrogen: Yes   Region frozen until ice ball extended beyond lesion: Yes   Outcome: patient tolerated procedure well with no complications   Post-procedure details: wound care instructions given    Neoplasm of uncertain behavior Right superior pectoral  Epidermal / dermal shaving  Lesion length (cm):  0.4 Lesion width (cm):  0.4 Margin per side (cm):  0.2 Total excision diameter (cm):  0.8 Informed consent: discussed and consent obtained   Timeout: patient name, date of birth, surgical site, and procedure verified   Procedure prep:  Patient was prepped and draped in usual sterile fashion Prep type:  Isopropyl alcohol Anesthesia: the lesion was anesthetized in a standard fashion   Anesthetic:  1% lidocaine w/ epinephrine 1-100,000 buffered w/ 8.4% NaHCO3 Instrument used: flexible razor blade   Hemostasis achieved with: pressure, aluminum chloride and electrodesiccation   Outcome: patient tolerated procedure well     Post-procedure details: sterile dressing applied and wound care instructions given   Dressing type: bandage and petrolatum    Specimen 1 - Surgical pathology Differential Diagnosis: R/O BCC vs other Check Margins: No  Schamberg's purpura Bilateral lower legs  Benign, observe.   Lentigines - Scattered tan macules - Discussed due to sun exposure - Benign, observe - Call for any changes Melanocytic Nevi - Tan-brown and/or pink-flesh-colored symmetric macules and papules - Benign appearing on exam today - Observation - Call clinic for new or changing moles - Recommend daily use of broad spectrum spf 30+ sunscreen to sun-exposed areas. Actinic Damage - diffuse scaly erythematous macules with underlying dyspigmentation - Recommend daily broad spectrum sunscreen SPF 30+ to sun-exposed areas, reapply every 2 hours as needed.  - Call for new or changing lesions. Purpura - Violaceous macules and patches - Benign - Related to age, sun damage and/or use of blood thinners - Observe - Can use OTC arnica containing moisturizer such as Dermend Bruise Formula if desired - Call for worsening or other concerns Seborrheic Keratoses - Stuck-on, waxy, tan-brown papules and plaques  - Discussed benign etiology and prognosis. - Observe - Call for any changes Hemangiomas - Red papules - Discussed benign nature - Observe - Call for any changes   Return for 2 to 3 months , Biopsy follow up, AK Follow up.   Marene Lenz, CMA, am acting as scribe for Sarina Ser, MD .   Documentation: I have reviewed the above documentation for accuracy and completeness, and I agree with the above.  Sarina Ser, MD

## 2019-12-04 ENCOUNTER — Telehealth: Payer: Self-pay

## 2019-12-04 NOTE — Telephone Encounter (Signed)
-----   Message from Ralene Bathe, MD sent at 12/02/2019  5:41 PM EDT ----- Skin , right superior pectoral HIDROACANTHOMA SIMPLEX, SEE DESCRIPTION  Benign growth Recheck on next appointment No further treatment needed at this time

## 2019-12-04 NOTE — Telephone Encounter (Signed)
Advised patient of results.  

## 2019-12-11 ENCOUNTER — Other Ambulatory Visit: Payer: Self-pay | Admitting: Family Medicine

## 2019-12-12 ENCOUNTER — Telehealth: Payer: Self-pay

## 2019-12-12 DIAGNOSIS — M79604 Pain in right leg: Secondary | ICD-10-CM | POA: Diagnosis not present

## 2019-12-12 DIAGNOSIS — M7061 Trochanteric bursitis, right hip: Secondary | ICD-10-CM | POA: Diagnosis not present

## 2019-12-12 DIAGNOSIS — M25551 Pain in right hip: Secondary | ICD-10-CM | POA: Diagnosis not present

## 2019-12-12 NOTE — Telephone Encounter (Signed)
left message with male for patient to return my call

## 2019-12-18 ENCOUNTER — Telehealth: Payer: Self-pay

## 2019-12-18 NOTE — Telephone Encounter (Signed)
LM on VM to return my call. 

## 2019-12-18 NOTE — Telephone Encounter (Signed)
Patient informed of biopsy results, patient verbalized understanding.

## 2019-12-19 DIAGNOSIS — M25551 Pain in right hip: Secondary | ICD-10-CM | POA: Diagnosis not present

## 2019-12-19 DIAGNOSIS — M7061 Trochanteric bursitis, right hip: Secondary | ICD-10-CM | POA: Diagnosis not present

## 2019-12-19 DIAGNOSIS — M79604 Pain in right leg: Secondary | ICD-10-CM | POA: Diagnosis not present

## 2019-12-24 ENCOUNTER — Ambulatory Visit (INDEPENDENT_AMBULATORY_CARE_PROVIDER_SITE_OTHER): Payer: PPO | Admitting: Internal Medicine

## 2019-12-24 ENCOUNTER — Encounter: Payer: Self-pay | Admitting: Internal Medicine

## 2019-12-24 ENCOUNTER — Other Ambulatory Visit: Payer: Self-pay

## 2019-12-24 VITALS — BP 150/90 | HR 74 | Ht 74.0 in | Wt 203.4 lb

## 2019-12-24 DIAGNOSIS — I10 Essential (primary) hypertension: Secondary | ICD-10-CM | POA: Diagnosis not present

## 2019-12-24 DIAGNOSIS — I255 Ischemic cardiomyopathy: Secondary | ICD-10-CM | POA: Diagnosis not present

## 2019-12-24 DIAGNOSIS — E785 Hyperlipidemia, unspecified: Secondary | ICD-10-CM | POA: Diagnosis not present

## 2019-12-24 DIAGNOSIS — I2510 Atherosclerotic heart disease of native coronary artery without angina pectoris: Secondary | ICD-10-CM

## 2019-12-24 MED ORDER — NITROGLYCERIN 0.4 MG SL SUBL: 0.4000 mg | SUBLINGUAL_TABLET | SUBLINGUAL | 2 refills | Status: DC | PRN

## 2019-12-24 MED ORDER — ROSUVASTATIN CALCIUM 20 MG PO TABS: 20.0000 mg | ORAL_TABLET | Freq: Every day | ORAL | 2 refills | Status: DC

## 2019-12-24 NOTE — Patient Instructions (Signed)
Medication Instructions:  Your physician recommends that you continue on your current medications as directed. Please refer to the Current Medication list given to you today.  *If you need a refill on your cardiac medications before your next appointment, please call your pharmacy*   Follow-Up: At [facility], you and your health needs are our priority.  As part of our continuing mission to provide you with exceptional heart care, we have created designated Provider Care Teams.  These Care Teams include your primary Cardiologist (physician) and Advanced Practice Providers (APPs -  Physician Assistants and Nurse Practitioners) who all work together to provide you with the care you need, when you need it.  We recommend signing up for the patient portal called "MyChart".  Sign up information is provided on this After Visit Summary.  MyChart is used to connect with patients for Virtual Visits (Telemedicine).  Patients are able to view lab/test results, encounter notes, upcoming appointments, etc.  Non-urgent messages can be sent to your provider as well.   To learn more about what you can do with MyChart, go to [email or web address].    Your next appointment:   6 month(s)  The format for your next appointment:   In Person  Provider:    You may see Christopher End, MD or one of the following Advanced Practice Providers on your designated Care Team:    Christopher Berge, NP  Ryan Dunn, PA-C  Jacquelyn Visser, PA-C  

## 2019-12-24 NOTE — Progress Notes (Signed)
Follow-up Outpatient Visit Date: 12/24/2019  Primary Care Provider: [name], MD [place] [address]  Chief Complaint: Follow-up coronary artery disease and heart failure  HPI:  Phillip [name] is a 79 y.o. male with history of coronary artery disease status post CABG in [id number], chronic systolic heart failure secondary to ischemic cardiomyopathy, post-operative atrial flutter, hypertension,andhyperlipidemia, who presents for follow-up of coronary artery disease and ischemic cardiomyopathy.  I last saw him in 06/2019, at which time Phillip [name] was feeling quite well.  He noted rare episodes of lightheadedness.  Carvedilol was stopped in the setting of sinus bradycardia with 1st degree AV block and intermittent lightheadedness.  Today, Phillip [name] reports he has been feeling well.  His only complaint is of pain involving the right lateral thigh for which she has been doing physical therapy.  The pain is improving.  He denies chest pain, shortness of breath, palpitations, lightheadedness, and edema.  Home blood pressures are typically in the [id number] to [id number] systolic.  He has not been exercising regularly but is playing golf and hopes to begin hiking again soon when his thigh pain has improved.  He is tolerating his medications well.  --------------------------------------------------------------------------------------------------  Past Medical History:  Diagnosis Date  . Anxiety   . Atrial flutter ([place]) 01/07/2018   Post-op after CABG  . Basal cell carcinoma 09/12/2018   Left forehead above lateral brow. Nodular pattern  . Cancer ([place])   . Coronary artery disease 2003   a. nonobstructive CAD by cath in 2003 and 2005; b.  Status post three-vessel CABG on 12/12/2017 with LIMA to LAD, sequential reverse SVG to OM1 and distal LCx  . Diverticulosis   . Hearing loss    bilateral  . Heart disease   . Heart murmur   . History of benign prostatic hypertrophy   . History  of radiation therapy 01/31/10-03/22/10   right tonsil/right neck node 7000 cGy 35 sessions, high risk lymph node volume 5940 cGy 35 sessions, low risk lymph node vol 5600 cGy 35 sessions  . 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 HTN (hypertension)   . Hyperlipidemia    diet controlled- taking medication d/t ensure drinking  . Hypothyroid 01/16/2013  . Ischemic cardiomyopathy    a. 10/2017: echo showing reduced EF of 40-45%, HK of the anterior, anteroseptal and apical myocardium with Grade 1 DD.   Marland Kitchen Neck pain   . Squamous cell carcinoma    right tonsil- 2011  . Squamous cell carcinoma of skin 09/02/2019   Crown scalp. WD SCC  . Squamous cell carcinoma of skin 09/02/2019   Left distal lateral deltoid. SCCis arising in SK  . Thrombocytopenia ([place]) 02/02/2012   unclear etiology  . Tinnitus   . Tubular adenoma of colon    Past Surgical History:  Procedure Laterality Date  . artoscopic knee    . CARDIAC CATHETERIZATION  2003, 2005   40% blockage, two 30% blockages treated medically Ron Parker)  . CARDIOVERSION N/A 02/12/2018   Procedure: CARDIOVERSION;  Surgeon: [name], MD;  Location: [facility];  Service: Cardiovascular;  Laterality: N/A;  . COLONOSCOPY  03/2015   TA x3, HP, melanosis coli, severe diverticulosis, rpt 3 yrs (Pyrtle)  . COLONOSCOPY  01/2019   fair prep, diverticulosis, f/u PRN (Pyrtle)  . CORONARY ARTERY BYPASS GRAFT N/A 12/12/2017   Procedure: CORONARY ARTERY BYPASS GRAFTING (CABG) x3 using the right greater saphenous vein harvested endoscopically and the left internal mammary artery. LIMA to  LAD, SEQ SVG to OM1 & OM2;  Surgeon: [name], MD;  Location: [facility];  Service: Open Heart Surgery;  Laterality: N/A;  . DENTAL SURGERY     extractions  . IR THORACENTESIS ASP PLEURAL SPACE W/IMG GUIDE  01/17/2018  . KNEE ARTHROSCOPY Left   . LEFT HEART CATH AND CORONARY ANGIOGRAPHY N/A 12/10/2017   Procedure: LEFT HEART CATH AND CORONARY ANGIOGRAPHY;  Surgeon: [name], MD;   Location: [facility];  Service: Cardiovascular;  Laterality: N/A;  . PEG PLACEMENT  02/2010  . TEE WITHOUT CARDIOVERSION N/A 12/12/2017   Procedure: TRANSESOPHAGEAL ECHOCARDIOGRAM (TEE);  Surgeon: [name], MD;  Location: [facility];  Service: Open Heart Surgery;  Laterality: N/A;  . TONSILLECTOMY    . triple bypass  11/2017  . VASECTOMY      Current Meds  Medication Sig  . amLODipine (NORVASC) 10 MG tablet Take 1 tablet (10 mg total) by mouth daily.  Marland Kitchen aspirin EC 81 MG tablet Take 1 tablet (81 mg total) by mouth daily.  . Cholecalciferol (VITAMIN D3) 25 MCG (1000 UT) CAPS Take 1 capsule by mouth daily.  Marland Kitchen docusate sodium (COLACE) 250 MG capsule Takes total 600mg  /day  . EUTHYROX 25 MCG tablet TAKE 1 TABLET BY MOUTH ONCE DAILY BEFORE BREAKFAST  . furosemide (LASIX) 20 MG tablet Take 1 tablet (20 mg total) by mouth every other day.  Marland Kitchen MAGNESIUM PO Take 1 tablet by mouth daily.  . Multiple Vitamin (MULTIVITAMIN) tablet Take 1 tablet by mouth daily.  . nitroGLYCERIN (NITROSTAT) 0.4 MG SL tablet Place 1 tablet (0.4 mg total) under the tongue every 5 (five) minutes as needed for chest pain.  . Omega-3 Fatty Acids (OMEGA 3 PO) Take by mouth daily.  . Potassium 99 MG TABS Take 1 tablet by mouth daily.  . rosuvastatin (CRESTOR) 20 MG tablet Take 1 tablet by mouth once daily  . VITAMIN A PO Take 2,400 mcg by mouth daily.   . vitamin E 400 UNIT capsule Take 400 Units by mouth daily.  . zaleplon (SONATA) 10 MG capsule Take 1 capsule (10 mg total) by mouth at bedtime as needed for sleep.  Marland Kitchen Zinc 50 MG CAPS Take 50 mg by mouth daily.    Allergies: Ace inhibitors and Morphine and related  Social History   Tobacco Use  . Smoking status: Never Smoker  . Smokeless tobacco: Never Used  Substance Use Topics  . Alcohol use: Yes    Comment: 6 glasses of wine per year  . Drug use: No    Family History  Problem Relation Age of Onset  . Coronary artery disease Father   . Heart attack  Father 37  . Heart disease Father   . Stroke Mother   . Colon cancer Neg Hx   . Esophageal cancer Neg Hx   . Rectal cancer Neg Hx   . Stomach cancer Neg Hx   . Kidney cancer Neg Hx   . Kidney failure Neg Hx   . Prostate cancer Neg Hx   . Tuberculosis Neg Hx     Review of Systems: Phillip [name] notes reddish discoloration of his distal calves for several months.  --------------------------------------------------------------------------------------------------  Physical Exam: BP (!) 150/90 (BP Location: Left Arm, Patient Position: Sitting, Cuff Size: Normal)   Pulse 74   Ht 6\' 2"  (1.88 m)   Wt 203 lb 6 oz (92.3 kg)   SpO2 96%   BMI 26.11 kg/m   General: NAD. HEENT:  No conjunctival pallor or scleral icterus. Facemask in place. Neck: No JVD or HJR. Lungs: Normal work of breathing. Clear to auscultation bilaterally without wheezes or crackles. Heart: Regular rate and rhythm with 1/6 systolic murmur.  No rubs or gallops. Non-displaced PMI. Abd: Bowel sounds present. Soft, NT/ND without hepatosplenomegaly Ext: Trace pretibial edema bilaterally Skin: Warm and dry.  Reddish discoloration of both distal calves noted.  EKG: Baseline artifact.  Normal sinus rhythm with PVCs, left axis deviation, and LVH with abnormal repolarization.  Heart rate has increased from prior tracing on 06/25/2019.  First-degree AV block has also resolved.  Lab Results  Component Value Date   WBC 4.0 04/18/2019   HGB 16.7 04/18/2019   HCT 49.2 04/18/2019   MCV 98.8 04/18/2019   PLT 113.0 (L) 04/18/2019    Lab Results  Component Value Date   NA 141 04/18/2019   K 4.0 04/18/2019   CL 104 04/18/2019   CO2 30 04/18/2019   BUN 21 04/18/2019   CREATININE 0.77 04/18/2019   GLUCOSE 115 (H) 04/18/2019   ALT 19 04/18/2019    Lab Results  Component Value Date   CHOL 107 04/18/2019   HDL 37.30 (L) 04/18/2019   LDLCALC 50 04/18/2019   LDLDIRECT 101.0 10/13/2014   TRIG 100.0 04/18/2019   CHOLHDL 3  04/18/2019    --------------------------------------------------------------------------------------------------  ASSESSMENT AND PLAN: Coronary artery disease: Phillip [name] continues to do well without signs or symptoms of worsening coronary insufficiency.  He should continue his current medications for secondary prevention.  Ischemic cardiomyopathy: Other than trace pedal edema, which is likely a combination of venous insufficiency and amlodipine use, Phillip [name] appears euvolemic.  We will defer medication changes at this time; okay to continue furosemide 20 mg every other day.  Hypertension: Blood pressure mildly elevated today but typically better at home.  I encouraged Phillip [name] to minimize his sodium intake and to continue monitoring his blood pressure.  He should let us know if it is consistently above 130/80.  Heart rate and PR interval have improved with discontinuation of carvedilol after our last visit.  Hyperlipidemia: LDL at goal on last check in 03/2019.  Continue rosuvastatin 20 mg daily.  Follow-up: Return to clinic in 6 months.  [name], MD 12/24/2019 9:59 AM

## 2019-12-26 DIAGNOSIS — M79604 Pain in right leg: Secondary | ICD-10-CM | POA: Diagnosis not present

## 2019-12-26 DIAGNOSIS — M25551 Pain in right hip: Secondary | ICD-10-CM | POA: Diagnosis not present

## 2019-12-26 DIAGNOSIS — M7061 Trochanteric bursitis, right hip: Secondary | ICD-10-CM | POA: Diagnosis not present

## 2020-01-05 DIAGNOSIS — M79604 Pain in right leg: Secondary | ICD-10-CM | POA: Diagnosis not present

## 2020-01-05 DIAGNOSIS — M25551 Pain in right hip: Secondary | ICD-10-CM | POA: Diagnosis not present

## 2020-01-05 DIAGNOSIS — M7061 Trochanteric bursitis, right hip: Secondary | ICD-10-CM | POA: Diagnosis not present

## 2020-01-14 ENCOUNTER — Encounter (INDEPENDENT_AMBULATORY_CARE_PROVIDER_SITE_OTHER): Payer: Self-pay | Admitting: Otolaryngology

## 2020-01-14 ENCOUNTER — Other Ambulatory Visit: Payer: Self-pay

## 2020-01-14 ENCOUNTER — Ambulatory Visit (INDEPENDENT_AMBULATORY_CARE_PROVIDER_SITE_OTHER): Payer: PPO | Admitting: Otolaryngology

## 2020-01-14 VITALS — Temp 97.2°F

## 2020-01-14 DIAGNOSIS — Z85818 Personal history of malignant neoplasm of other sites of lip, oral cavity, and pharynx: Secondary | ICD-10-CM | POA: Diagnosis not present

## 2020-01-14 DIAGNOSIS — H6123 Impacted cerumen, bilateral: Secondary | ICD-10-CM

## 2020-01-14 NOTE — Progress Notes (Signed)
HPI: Phillip [name] is a 79 y.o. male who returns today for evaluation of ear complaints.  He wears bilateral hearing aids but has had more difficulty hearing recently.  He will to get his ears checked before seeing his audiologist.  He also has a tendency to grind his teeth. He sees his dentist every 6 months. 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 He has had some chronic buccal mucosal leukoplakia for several years and had a biopsy performed in 2019 that showed no evidence of dysplasia or malignancy.  Changes were inflammatory with reactive changes..  Past Medical History:  Diagnosis Date  . Anxiety   . Atrial flutter ([place]) 01/07/2018   Post-op after CABG  . Basal cell carcinoma 09/12/2018   Left forehead above lateral brow. Nodular pattern  . Cancer ([place])   . Coronary artery disease 2003   a. nonobstructive CAD by cath in 2003 and 2005; b.  Status post three-vessel CABG on 12/12/2017 with LIMA to LAD, sequential reverse SVG to OM1 and distal LCx  . Diverticulosis   . Hearing loss    bilateral  . Heart disease   . Heart murmur   . History of benign prostatic hypertrophy   . History of radiation therapy 01/31/10-03/22/10   right tonsil/right neck node 7000 cGy 35 sessions, high risk lymph node volume 5940 cGy 35 sessions, low risk lymph node vol 5600 cGy 35 sessions  . 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 HTN (hypertension)   . Hyperlipidemia    diet controlled- taking medication d/t ensure drinking  . Hypothyroid 01/16/2013  . Ischemic cardiomyopathy    a. 10/2017: echo showing reduced EF of 40-45%, HK of the anterior, anteroseptal and apical myocardium with Grade 1 DD.   Marland Kitchen Neck pain   . Squamous cell carcinoma    right tonsil- 2011  . Squamous cell carcinoma of skin 09/02/2019   Crown scalp. WD SCC  . Squamous cell carcinoma of skin 09/02/2019   Left distal lateral deltoid. SCCis arising in SK  .  Thrombocytopenia ([place]) 02/02/2012   unclear etiology  . Tinnitus   . Tubular adenoma of colon    Past Surgical History:  Procedure Laterality Date  . artoscopic knee    . CARDIAC CATHETERIZATION  2003, 2005   40% blockage, two 30% blockages treated medically Ron Parker)  . CARDIOVERSION N/A 02/12/2018   Procedure: CARDIOVERSION;  Surgeon: [name], MD;  Location: [facility];  Service: Cardiovascular;  Laterality: N/A;  . COLONOSCOPY  03/2015   TA x3, HP, melanosis coli, severe diverticulosis, rpt 3 yrs (Pyrtle)  . COLONOSCOPY  01/2019   fair prep, diverticulosis, f/u PRN (Pyrtle)  . CORONARY ARTERY BYPASS GRAFT N/A 12/12/2017   Procedure: CORONARY ARTERY BYPASS GRAFTING (CABG) x3 using the right greater saphenous vein harvested endoscopically and the left internal mammary artery. LIMA to LAD, SEQ SVG to OM1 & OM2;  Surgeon: [name], MD;  Location: [facility];  Service: Open Heart Surgery;  Laterality: N/A;  . DENTAL SURGERY     extractions  . IR THORACENTESIS ASP PLEURAL SPACE W/IMG GUIDE  01/17/2018  . KNEE ARTHROSCOPY Left   . LEFT HEART CATH AND CORONARY ANGIOGRAPHY N/A 12/10/2017   Procedure: LEFT HEART CATH AND CORONARY ANGIOGRAPHY;  Surgeon: [name], MD;  Location: [facility];  Service: Cardiovascular;  Laterality: N/A;  . PEG PLACEMENT  02/2010  . TEE  WITHOUT CARDIOVERSION N/A 12/12/2017   Procedure: TRANSESOPHAGEAL ECHOCARDIOGRAM (TEE);  Surgeon: [name], MD;  Location: [facility];  Service: Open Heart Surgery;  Laterality: N/A;  . TONSILLECTOMY    . triple bypass  11/2017  . VASECTOMY     Social History   Socioeconomic History  . Marital status: Married    Spouse name: Fraser Din  . Number of children: 1  . Years of education: Not on file  . Highest education level: Not on file  Occupational History  . Not on file  Tobacco Use  . Smoking status: Never Smoker  . Smokeless tobacco: Never Used  Substance and Sexual Activity  . Alcohol use: Yes     Comment: 6 glasses of wine per year  . Drug use: No  . Sexual activity: Yes  Other Topics Concern  . 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    Plays golf, remains very active with an interest in politics. Jan 2011 moved to area.    Involved in [employer].    He is a runner - has run [employer] and [employer]. S/p torn meniscus.    Social Determinants of Health   Financial Resource Strain:   . Difficulty of Paying Living Expenses:   Food Insecurity:   . Worried About Charity fundraiser in the Last Year:   . Arboriculturist in the Last Year:   Transportation Needs:   . Film/video editor (Medical):   Marland Kitchen Lack of Transportation (Non-Medical):   Physical Activity:   . Days of Exercise per Week:   . Minutes of Exercise per Session:   Stress:   . Feeling of Stress :   Social Connections:   . Frequency of Communication with Friends and Family:   . Frequency of Social Gatherings with Friends and Family:   . Attends Religious Services:   . Active Member of Clubs or Organizations:   . Attends Archivist Meetings:   Marland Kitchen Marital Status:    Family History  Problem Relation Age of Onset  . Coronary artery disease Father   . Heart attack Father 56  . Heart disease Father   . Stroke Mother   . Colon cancer Neg Hx   . Esophageal cancer Neg Hx   . Rectal cancer Neg Hx   . Stomach cancer Neg Hx   . Kidney cancer Neg Hx   . Kidney failure Neg Hx   . Prostate cancer Neg Hx   . Tuberculosis Neg Hx    Allergies  Allergen Reactions  . Ace Inhibitors Swelling and Other (See Comments)    Possible angioedema (upper lip swelling)  . Morphine And Related Swelling    SWELLING REACTION UNSPECIFIED  [severity rated per PMH, 12/11/2017]   Prior to Admission medications   Medication  Sig Start Date End Date Taking? Authorizing Provider  amLODipine (NORVASC) 10 MG tablet Take 1 tablet (10 mg total) by mouth daily. 06/25/19 12/23/28 Yes End, Harrell Gave, MD  Ascorbic Acid (VITAMIN C PO) Take 500 mg by mouth 2 (two) times a day.    Yes [provider]  aspirin EC 81 MG tablet Take 1 tablet (81 mg total) by mouth daily. 01/17/18  Yes End, Harrell Gave, MD  Cholecalciferol (VITAMIN D3) 25 MCG (1000 UT) CAPS Take 1  capsule by mouth daily.   Yes [provider]  docusate sodium (COLACE) 250 MG capsule Takes total 600mg  /day 04/25/19  Yes [name], MD  EUTHYROX 25 MCG tablet TAKE 1 TABLET BY MOUTH ONCE DAILY BEFORE BREAKFAST 12/11/19  Yes [name], MD  furosemide (LASIX) 20 MG tablet Take 1 tablet (20 mg total) by mouth every other day. 06/25/19 12/23/28 Yes End, Harrell Gave, MD  MAGNESIUM PO Take 1 tablet by mouth daily.   Yes [provider]  Multiple Vitamin (MULTIVITAMIN) tablet Take 1 tablet by mouth daily.   Yes [provider]  Multiple Vitamins-Minerals (ZINC PO) Take by mouth daily.   Yes [provider]  nitroGLYCERIN (NITROSTAT) 0.4 MG SL tablet Place 1 tablet (0.4 mg total) under the tongue every 5 (five) minutes as needed for chest pain (Maximum 3 doses.). 12/24/19  Yes End, Harrell Gave, MD  Omega-3 Fatty Acids (OMEGA 3 PO) Take by mouth daily.   Yes [provider]  Potassium 99 MG TABS Take 1 tablet by mouth daily.   Yes [provider]  rosuvastatin (CRESTOR) 20 MG tablet Take 1 tablet (20 mg total) by mouth daily. 12/24/19  Yes End, Harrell Gave, MD  VITAMIN A PO Take 2,400 mcg by mouth daily.    Yes [provider]  vitamin E 400 UNIT capsule Take 400 Units by mouth daily.   Yes [provider]  zaleplon (SONATA) 10 MG capsule Take 1 capsule (10 mg total) by mouth at bedtime as needed for sleep. 10/24/19  Yes [name], MD  Zinc 50 MG CAPS Take 50 mg by mouth daily.   Yes  [provider]     Positive ROS: Otherwise negative  All other systems have been reviewed and were otherwise negative with the exception of those mentioned in the HPI and as above.  Physical Exam: Constitutional: Alert, well-appearing, no acute distress Ears: External ears without lesions or tenderness.  He had a moderate amount of wax buildup in both ear canals that was cleaned with forceps and curettes.  TMs were clear bilaterally with good mobility pneumatic otoscopy. Nasal: External nose without lesions. Clear nasal passages Oral: Lips and gums without lesions. Tongue and palate mucosa without lesions. Posterior oropharynx clear.  Tonsil regions appear benign bilaterally.  Again noted are some chronic leukoplakia and thickening of buccal mucosa.  This is not tender to palpation.  Some of this appears to be reactive from his dentition.  Ulcerations in the area although thickened to palpation is not tender to palpation. Neck: No palpable adenopathy or masses Respiratory: Breathing comfortably  Skin: No facial/neck lesions or rash noted.  Cerumen impaction removal  Date/Time: 01/14/2020 10:47 AM Performed by: Rozetta Nunnery, MD Authorized by: Rozetta Nunnery, MD   Consent:    Consent obtained:  Verbal   Consent given by:  Patient   Risks discussed:  Pain and bleeding Procedure details:    Location:  L ear and R ear   Procedure type: curette and forceps   Post-procedure details:    Inspection:  TM intact and canal normal   Hearing quality:  Improved   Patient tolerance of procedure:  Tolerated well, no immediate complications Comments:     TMs are clear bilaterally.    Assessment: Bilateral cerumen buildup in patient with hearing aids.  Bilateral SNHL. History of tonsil cancer diagnosed in 2011 treated with chemoradiation. Chronic bilateral buccal mucosal changes.  Plan: Patient is scheduled to see his dentist in 3 to  4 months.  Discussed with him  concerning using a mouthguard and perhaps trying one of the athletic mouthguard versus getting 1 from his dentist. He will follow-up in 6 months for recheck.   Radene Journey, MD

## 2020-01-28 ENCOUNTER — Telehealth: Payer: Self-pay

## 2020-01-28 NOTE — Telephone Encounter (Signed)
Patient contacted the office and states he has been doing PT for his right leg pain. He states the PT no longer seems to be helping, and he is wanting a referral to a sports medicine physician. Please advise.

## 2020-01-28 NOTE — Telephone Encounter (Signed)
Sorry PT is no longer helping. Recommend he come in to see Dr Lorelei Pont our sports med doct. plz schedule appt.

## 2020-01-29 NOTE — Telephone Encounter (Signed)
Spoke to pt. Made appt with Dr Lorelei Pont 02-04-20

## 2020-02-04 ENCOUNTER — Encounter: Payer: Self-pay | Admitting: Family Medicine

## 2020-02-04 ENCOUNTER — Ambulatory Visit (INDEPENDENT_AMBULATORY_CARE_PROVIDER_SITE_OTHER): Payer: PPO | Admitting: Family Medicine

## 2020-02-04 ENCOUNTER — Other Ambulatory Visit: Payer: Self-pay

## 2020-02-04 VITALS — BP 140/70 | HR 62 | Temp 98.3°F | Ht 74.0 in | Wt 203.5 lb

## 2020-02-04 DIAGNOSIS — G5701 Lesion of sciatic nerve, right lower limb: Secondary | ICD-10-CM

## 2020-02-04 DIAGNOSIS — M5416 Radiculopathy, lumbar region: Secondary | ICD-10-CM | POA: Diagnosis not present

## 2020-02-04 MED ORDER — PREDNISONE 20 MG PO TABS
ORAL_TABLET | ORAL | 0 refills | Status: DC
Start: 2020-02-04 — End: 2020-04-30

## 2020-02-04 NOTE — Progress Notes (Signed)
[name] T. Delailah Spieth, MD, [facility]  [facility] and [place] at [facility] [place], [address]  Phone: [phone]  FAX: [phone]  [name] - 79 y.o. male  MRN [id number]  Date of Birth: 1940-11-09  Date: 02/04/2020  PCP: [name], MD  Referral: [name], MD  Chief Complaint  Patient presents with  . Leg Pain    Right    This visit occurred during the SARS-CoV-2 public health emergency.  Safety protocols were in place, including screening questions prior to the visit, additional usage of staff PPE, and extensive cleaning of exam room while observing appropriate contact time as indicated for disinfecting solutions.   Subjective:   Phillip [name] is a 79 y.o. very pleasant male patient with Body mass index is 26.13 kg/m. who presents with the following:  R leg pain:  About three or four months ago.  Lateral going all the way down his leg.  This starts in the posterior lateral aspect of his pelvis.  He does not describe any groin pain and he does not have any pain with terminal motion whether abduction, flexion, or rotational movements at the hip.  Has seen by Dr. Annette Stable.  Unclear what they did.Marland Kitchen He did not have any specific intervention that he can recall, but he did have some physical therapy with improvement of his symptoms. R postero lateral pain.  Large scale runner and college football.  Ran at a high level.  He ran the [place] when he was younger. Plays golf a lot.  Very good.  Senior Photographer.   Pain is probably a four or five.  Trouble sleeping on his side. Getting in a car is often difficult.  No groin pain in the groin.  Some NSAIDS. Went to PT.  No trouble with exercises.  He still has not improved.  Balance is not so good.     Review of Systems is noted in the HPI, as appropriate   Objective:   BP 140/70   Pulse 62   Temp 98.3 F (36.8 C)  (Temporal)   Ht 6\' 2"  (1.88 m)   Wt 203 lb 8 oz (92.3 kg)   SpO2 96%   BMI 26.13 kg/m    GEN: No acute distress; alert,appropriate. PULM: Breathing comfortably in no respiratory distress PSYCH: Normally interactive.   HIP EXAM: SIDE: Right ROM: Abduction, Flexion, Internal and External range of motion: Normal abduction abduction, flexion.  Does have some minor restriction in internal and external rotation Pain with terminal IROM and EROM: None GTB: NT SLR: NEG Knees: No effusion FABER: NT REVERSE FABER: Tender on the right Piriformis: Tender on the right Str: flexion: 5/5 abduction: 5/5 adduction: 5/5 Strength testing non-tender   GEN: No acute distress; alert,appropriate. PULM: Breathing comfortably in no respiratory distress PSYCH: Normally interactive.   Range of motion at  the waist: Flexion: normal Extension: normal Lateral bending: normal Rotation: all normal  Inspection/Deformity: N Paraspinus Tenderness: Minimal  B Ankle Dorsiflexion (L5,4): 5/5 B Great Toe Dorsiflexion (L5,4): 5/5 Heel Walk (L5): WNL Toe Walk (S1): WNL Rise/Squat (L4): WNL  SENSORY B Medial Foot (L4): WNL B Dorsum (L5): WNL B Lateral (S1): WNL Light Touch: WNL Pinprick: WNL  REFLEXES Knee (L4): 2+ Ankle (S1): 2+  B SLR, seated: neg B SLR, supine: neg  Radiology: No results found.  Assessment and Plan:     ICD-10-CM   1. Piriformis syndrome of right  side  G57.01   2. Lumbar radiculopathy, acute  M54.16    This is not an intra-articular hip problem and it is also not a trochanteric bursitis issue as well as no significant ischial bursitis, or specific posterior pelvic bursitis.  Classical piriformis syndrome, and this is worse when he walks and stands for a long period of time.  He is focally tender at the piriformis itself.  He does have a known history of lumbar radiculopathy on the right leg in the lowest levels of the lumbar spine.  Pulse steroids, piriformis rehab.   I anticipate that he would do well.  I appreciate the opportunity to evaluate this very friendly patient. If you have any question regarding his care or prognosis, do not hesitate to ask.   Follow-up: Return in about 6 weeks (around 03/17/2020).  Meds ordered this encounter  Medications  . predniSONE (DELTASONE) 20 MG tablet    Sig: 2 tabs po daily for 5 days, then 1 tab po daily for 5 days    Dispense:  15 tablet    Refill:  0   There are no discontinued medications. No orders of the defined types were placed in this encounter.   Signed,  [name]. Analynn Daum, MD   Outpatient Encounter Medications as of 02/04/2020  Medication Sig  . amLODipine (NORVASC) 10 MG tablet Take 1 tablet (10 mg total) by mouth daily.  . Ascorbic Acid (VITAMIN C PO) Take 500 mg by mouth 2 (two) times a day.   Marland Kitchen aspirin EC 81 MG tablet Take 1 tablet (81 mg total) by mouth daily.  . Cholecalciferol (VITAMIN D3) 25 MCG (1000 UT) CAPS Take 1 capsule by mouth daily.  Marland Kitchen docusate sodium (COLACE) 250 MG capsule Takes total 600mg  /day  . EUTHYROX 25 MCG tablet TAKE 1 TABLET BY MOUTH ONCE DAILY BEFORE BREAKFAST  . furosemide (LASIX) 20 MG tablet Take 1 tablet (20 mg total) by mouth every other day.  Marland Kitchen MAGNESIUM PO Take 1 tablet by mouth daily.  . Multiple Vitamin (MULTIVITAMIN) tablet Take 1 tablet by mouth daily.  . Multiple Vitamins-Minerals (ZINC PO) Take by mouth daily.  . nitroGLYCERIN (NITROSTAT) 0.4 MG SL tablet Place 1 tablet (0.4 mg total) under the tongue every 5 (five) minutes as needed for chest pain (Maximum 3 doses.).  Marland Kitchen Omega-3 Fatty Acids (OMEGA 3 PO) Take by mouth daily.  . Potassium 99 MG TABS Take 1 tablet by mouth daily.  . rosuvastatin (CRESTOR) 20 MG tablet Take 1 tablet (20 mg total) by mouth daily.  Marland Kitchen VITAMIN A PO Take 2,400 mcg by mouth daily.   . vitamin E 400 UNIT capsule Take 400 Units by mouth daily.  . zaleplon (SONATA) 10 MG capsule Take 1 capsule (10 mg total) by mouth at bedtime as  needed for sleep.  Marland Kitchen Zinc 50 MG CAPS Take 50 mg by mouth daily.  . predniSONE (DELTASONE) 20 MG tablet 2 tabs po daily for 5 days, then 1 tab po daily for 5 days   No facility-administered encounter medications on file as of 02/04/2020.

## 2020-02-18 ENCOUNTER — Encounter: Payer: Self-pay | Admitting: Dermatology

## 2020-02-18 ENCOUNTER — Ambulatory Visit: Payer: PPO | Admitting: Dermatology

## 2020-02-18 ENCOUNTER — Other Ambulatory Visit: Payer: Self-pay

## 2020-02-18 DIAGNOSIS — Z85828 Personal history of other malignant neoplasm of skin: Secondary | ICD-10-CM | POA: Diagnosis not present

## 2020-02-18 DIAGNOSIS — L821 Other seborrheic keratosis: Secondary | ICD-10-CM

## 2020-02-18 DIAGNOSIS — L820 Inflamed seborrheic keratosis: Secondary | ICD-10-CM | POA: Diagnosis not present

## 2020-02-18 DIAGNOSIS — L578 Other skin changes due to chronic exposure to nonionizing radiation: Secondary | ICD-10-CM | POA: Diagnosis not present

## 2020-02-18 DIAGNOSIS — Z872 Personal history of diseases of the skin and subcutaneous tissue: Secondary | ICD-10-CM | POA: Diagnosis not present

## 2020-02-18 NOTE — Progress Notes (Signed)
   Follow-Up Visit   Subjective  Phillip [name] is a 79 y.o. male who presents for the following: Actinic Keratosis (Left distal lateral deltoid at inf edge of SCCIS scar - recheck today), recheck SCC sites (crown/scalp, Left distal lateral deltoid - patient would like areas checked because he can't see the scalp), and lesions (on the L mandible that are irregular and irritated ).  The following portions of the chart were reviewed this encounter and updated as appropriate:  Tobacco  Allergies  Meds  Problems  Med Hx  Surg Hx  Fam Hx     Review of Systems:  No other skin or systemic complaints except as noted in HPI or Assessment and Plan.  Objective  Well appearing patient in no apparent distress; mood and affect are within normal limits.  A focused examination was performed including the face, chest, arms, hands, and scalp. Relevant physical exam findings are noted in the Assessment and Plan.  Objective  crown/scalp, Left distal lateral deltoid: Well healed scar with no evidence of recurrence.   Objective  L prox mandible x 2 (2): Erythematous keratotic or waxy stuck-on papule or plaque.   Objective  Left distal lateral deltoid at inf edge: Clear    Assessment & Plan    History of SCC (squamous cell carcinoma) of skin crown/scalp, Left distal lateral deltoid  Clear. Observe for recurrence. Call clinic for new or changing lesions.  Recommend regular skin exams, daily broad-spectrum spf 30+ sunscreen use, and photoprotection.     Inflamed seborrheic keratosis (2) L prox mandible x 2  Destruction of lesion - L prox mandible x 2 Complexity: simple   Destruction method: cryotherapy   Informed consent: discussed and consent obtained   Timeout:  patient name, date of birth, surgical site, and procedure verified Lesion destroyed using liquid nitrogen: Yes   Region frozen until ice ball extended beyond lesion: Yes   Outcome: patient tolerated procedure well with no  complications   Post-procedure details: wound care instructions given    History of actinic keratosis Left distal lateral deltoid at inf edge  Clear. Observe for recurrence. Call clinic for new or changing lesions.  Recommend regular skin exams, daily broad-spectrum spf 30+ sunscreen use, and photoprotection.     Actinic Damage - diffuse scaly erythematous macules with underlying dyspigmentation - Recommend daily broad spectrum sunscreen SPF 30+ to sun-exposed areas, reapply every 2 hours as needed.  - Call for new or changing lesions.  Seborrheic Keratoses - Stuck-on, waxy, tan-brown papules and plaques  - Discussed benign etiology and prognosis. - Observe - Call for any changes  Return in about 7 months (around 09/19/2020).  Luther Redo, CMA, am acting as scribe for Sarina Ser, MD .  Documentation: I have reviewed the above documentation for accuracy and completeness, and I agree with the above.  Sarina Ser, MD

## 2020-03-01 ENCOUNTER — Encounter: Payer: Self-pay | Admitting: Dermatology

## 2020-03-21 DIAGNOSIS — U071 COVID-19: Secondary | ICD-10-CM

## 2020-03-21 HISTORY — DX: COVID-19: U07.1

## 2020-03-24 ENCOUNTER — Ambulatory Visit: Payer: PPO | Admitting: Family Medicine

## 2020-04-14 ENCOUNTER — Ambulatory Visit
Admission: EM | Admit: 2020-04-14 | Discharge: 2020-04-14 | Disposition: A | Discharge status: Home / Self Care | Payer: PPO | Attending: Family Medicine | Admitting: Family Medicine

## 2020-04-14 DIAGNOSIS — Z7689 Persons encountering health services in other specified circumstances: Secondary | ICD-10-CM | POA: Diagnosis not present

## 2020-04-14 DIAGNOSIS — Z20822 Contact with and (suspected) exposure to covid-19: Secondary | ICD-10-CM

## 2020-04-14 DIAGNOSIS — B349 Viral infection, unspecified: Secondary | ICD-10-CM

## 2020-04-14 DIAGNOSIS — R5383 Other fatigue: Secondary | ICD-10-CM | POA: Diagnosis not present

## 2020-04-14 DIAGNOSIS — Z1152 Encounter for screening for COVID-19: Secondary | ICD-10-CM | POA: Diagnosis not present

## 2020-04-14 DIAGNOSIS — R05 Cough: Secondary | ICD-10-CM | POA: Diagnosis not present

## 2020-04-14 DIAGNOSIS — R059 Cough, unspecified: Secondary | ICD-10-CM

## 2020-04-14 NOTE — ED Provider Notes (Signed)
[place]   [id number] 04/14/20 Arrival Time: 9562   CC: COVID symptoms  SUBJECTIVE: History from: patient.  Phillip [name] is a 79 y.o. male who presents with abrupt onset of nasal congestion, PND, and persistent dry cough for the last 3 days. Reports feeling weak yesterday while playing golf, enough so that he stopped.Reports Covid exposure from daughter. Denies recent travel. Has negative history of Covid. Has completed Covid vaccines. Has taken tylenol for his symptoms with some relief. There are no aggravating or alleviating factors. Denies previous symptoms in the past. Denies fever, chills, sinus pain, rhinorrhea, sore throat, SOB, wheezing, chest pain, nausea, changes in bowel or bladder habits.    ROS: As per HPI.  All other pertinent ROS negative.     Past Medical History:  Diagnosis Date  . Anxiety   . Atrial flutter ([place]) 01/07/2018   Post-op after CABG  . Basal cell carcinoma 09/12/2018   Left forehead above lateral brow. Nodular pattern  . Cancer ([place])   . Coronary artery disease 2003   a. nonobstructive CAD by cath in 2003 and 2005; b.  Status post three-vessel CABG on 12/12/2017 with LIMA to LAD, sequential reverse SVG to OM1 and distal LCx  . Diverticulosis   . Hearing loss    bilateral  . Heart disease   . Heart murmur   . History of benign prostatic hypertrophy   . History of radiation therapy 01/31/10-03/22/10   right tonsil/right neck node 7000 cGy 35 sessions, high risk lymph node volume 5940 cGy 35 sessions, low risk lymph node vol 5600 cGy 35 sessions  . 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 HTN (hypertension)   . Hyperlipidemia    diet controlled- taking medication d/t ensure drinking  . Hypothyroid 01/16/2013  . Ischemic cardiomyopathy    a. 10/2017: echo showing reduced EF of 40-45%, HK of the anterior, anteroseptal and apical myocardium with Grade 1 DD.   Marland Kitchen Neck pain   . Squamous cell carcinoma    right tonsil- 2011  . Squamous cell carcinoma of skin  09/02/2019   Crown scalp. WD SCC  . Squamous cell carcinoma of skin 09/02/2019   Left distal lateral deltoid. SCCis arising in SK  . Thrombocytopenia ([place]) 02/02/2012   unclear etiology  . Tinnitus   . Tubular adenoma of colon    Past Surgical History:  Procedure Laterality Date  . artoscopic knee    . CARDIAC CATHETERIZATION  2003, 2005   40% blockage, two 30% blockages treated medically Ron Parker)  . CARDIOVERSION N/A 02/12/2018   Procedure: CARDIOVERSION;  Surgeon: [name], MD;  Location: [facility] ORS;  Service: Cardiovascular;  Laterality: N/A;  . COLONOSCOPY  03/2015   TA x3, HP, melanosis coli, severe diverticulosis, rpt 3 yrs ([place])  . COLONOSCOPY  01/2019   fair prep, diverticulosis, f/u PRN ([place])  . CORONARY ARTERY BYPASS GRAFT N/A 12/12/2017   Procedure: CORONARY ARTERY BYPASS GRAFTING (CABG) x3 using the right greater saphenous vein harvested endoscopically and the left internal mammary artery. LIMA to LAD, SEQ SVG to OM1 & OM2;  Surgeon: [name], MD;  Location: [facility];  Service: Open Heart Surgery;  Laterality: N/A;  . DENTAL SURGERY     extractions  . IR THORACENTESIS ASP PLEURAL SPACE W/IMG GUIDE  01/17/2018  . KNEE ARTHROSCOPY Left   . LEFT HEART CATH AND CORONARY ANGIOGRAPHY N/A 12/10/2017   Procedure: LEFT HEART CATH AND CORONARY ANGIOGRAPHY;  Surgeon: [name], MD;  Location:  [facility] IN[facility]ASIVE CV [facility];  Service: Cardiovascular;  Laterality: N/A;  . PEG PLACEMENT  02/2010  . TEE WITHOUT CARDIOVERSION N/A 12/12/2017   Procedure: TRANSESOPHAGEAL ECHOCARDIOGRAM (TEE);  Surgeon: [name], MD;  Location: [facility];  Service: Open Heart Surgery;  Laterality: N/A;  . TONSILLECTOMY    . triple bypass  11/2017  . VASECTOMY     Allergies  Allergen Reactions  . Ace Inhibitors Swelling and Other (See Comments)    Possible angioedema (upper lip swelling)  . Morphine And Related Swelling    SWELLING REACTION UNSPECIFIED  [severity rated per PMH, 12/11/2017]     No current facility-administered medications on file prior to encounter.   Current Outpatient Medications on File Prior to Encounter  Medication Sig Dispense Refill  . amLODipine (NORVASC) 10 MG tablet Take 1 tablet (10 mg total) by mouth daily. 90 tablet 3  . Ascorbic Acid (VITAMIN C PO) Take 500 mg by mouth 2 (two) times a day.     Marland Kitchen aspirin EC 81 MG tablet Take 1 tablet (81 mg total) by mouth daily. 90 tablet 3  . Cholecalciferol (VITAMIN D3) 25 MCG (1000 UT) CAPS Take 1 capsule by mouth daily.    Marland Kitchen docusate sodium (COLACE) 250 MG capsule Takes total 600mg  /day    . EUTHYROX 25 MCG tablet TAKE 1 TABLET BY MOUTH ONCE DAILY BEFORE BREAKFAST 30 tablet 5  . furosemide (LASIX) 20 MG tablet Take 1 tablet (20 mg total) by mouth every other day. 45 tablet 2  . MAGNESIUM PO Take 1 tablet by mouth daily.    . Multiple Vitamin (MULTIVITAMIN) tablet Take 1 tablet by mouth daily.    . Multiple Vitamins-Minerals (ZINC PO) Take by mouth daily.    . nitroGLYCERIN (NITROSTAT) 0.4 MG SL tablet Place 1 tablet (0.4 mg total) under the tongue every 5 (five) minutes as needed for chest pain (Maximum 3 doses.). 25 tablet 2  . Omega-3 Fatty Acids (OMEGA 3 PO) Take by mouth daily.    . Potassium 99 MG TABS Take 1 tablet by mouth daily.    . predniSONE (DELTASONE) 20 MG tablet 2 tabs po daily for 5 days, then 1 tab po daily for 5 days 15 tablet 0  . rosuvastatin (CRESTOR) 20 MG tablet Take 1 tablet (20 mg total) by mouth daily. 90 tablet 2  . VITAMIN A PO Take 2,400 mcg by mouth daily.     . vitamin E 400 UNIT capsule Take 400 Units by mouth daily.    . zaleplon (SONATA) 10 MG capsule Take 1 capsule (10 mg total) by mouth at bedtime as needed for sleep. 30 capsule 0  . Zinc 50 MG CAPS Take 50 mg by mouth daily.     Social History   Socioeconomic History  . Marital status: Married    Spouse name: Fraser Din  . Number of children: 1  . Years of education: Not on file  . Highest education level: Not on file   Occupational History  . Not on file  Tobacco Use  . Smoking status: Never Smoker  . Smokeless tobacco: Never Used  Vaping Use  . Vaping Use: Never used  Substance and Sexual Activity  . Alcohol use: Yes    Comment: 6 glasses of wine per year  . Drug use: No  . Sexual activity: Yes  Other Topics Concern  . 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    Plays golf, remains very active with an interest in politics. Jan 2011 moved to area.    Involved in [employer].    He is a runner - has run [employer] and [employer]. S/p torn meniscus.    Social Determinants of Health   Financial Resource Strain:   . Difficulty of Paying Living Expenses: Not on file  Food Insecurity:   . Worried About Charity fundraiser in the Last Year: Not on file  . Ran Out of Food in the Last Year: Not on file  Transportation Needs:   . Lack of Transportation (Medical): Not on file  . Lack of Transportation (Non-Medical): Not on file  Physical Activity:   . Days of Exercise per Week: Not on file  . Minutes of Exercise per Session: Not on file  Stress:   . Feeling of Stress : Not on file  Social Connections:   . Frequency of Communication with Friends and Family: Not on file  . Frequency of Social Gatherings with Friends and Family: Not on file  . Attends Religious Services: Not on file  . Active Member of Clubs or Organizations: Not on file  . Attends Archivist Meetings: Not on file  . Marital Status: Not on file  Intimate Partner Violence:   . Fear of Current or Ex-Partner: Not on file  . Emotionally Abused: Not on file  . Physically Abused: Not on file  . Sexually Abused: Not on file   Family History  Problem Relation Age of Onset  . Coronary artery disease Father   . Heart attack  Father 63  . Heart disease Father   . Stroke Mother   . Colon cancer Neg Hx   . Esophageal cancer Neg Hx   . Rectal cancer Neg Hx   . Stomach cancer Neg Hx   . Kidney cancer Neg Hx   . Kidney failure Neg Hx   . Prostate cancer Neg Hx   . Tuberculosis Neg Hx     OBJECTIVE:  Vitals:   04/14/20 1017  BP: (!) 116/50  Pulse: 76  Resp: 16  Temp: 98.7 F (37.1 C)  SpO2: 94%     General appearance: alert; appears fatigued, but nontoxic; speaking in full sentences and tolerating own secretions HEENT: NCAT; Ears: EACs clear, TMs pearly gray; Eyes: PERRL.  EOM grossly intact. Sinuses: nontender; Nose: nares patent without rhinorrhea, Throat: oropharynx clear, tonsils non erythematous or enlarged, uvula midline  Neck: supple without LAD Lungs: unlabored respirations, symmetrical air entry; cough: mild; no respiratory distress; CTAB Heart: regular rate and rhythm.  Radial pulses 2+ symmetrical bilaterally Skin: warm and dry Psychological: alert and cooperative; normal mood and affect  LABS:  No results found for this or any previous visit (from the past 24 hour(s)).   ASSESSMENT & PLAN:  1. Viral illness   2. Cough   3. Other fatigue   4. Exposure to COVID-19 virus   5. Encounter for screening for COVID-19       COVID testing ordered.  It will take between 1-2 days for test results.  Someone will contact you regarding abnormal results.    Patient should remain in quarantine until they have received Covid results.  If negative you may resume normal activities (go back to work/school) while practicing hand hygiene, social distance, and mask wearing.  If positive, patient should remain in  quarantine for 10 days from symptom onset AND greater than 72 hours after symptoms resolution (absence of fever without the use of fever-reducing medication and improvement in respiratory symptoms), whichever is longer Get plenty of rest and push fluids Use OTC zyrtec for nasal congestion, runny  nose, and/or sore throat Use OTC flonase for nasal congestion and runny nose Use medications daily for symptom relief Use OTC medications like ibuprofen or tylenol as needed fever or pain Call or go to the ED if you have any new or worsening symptoms such as fever, worsening cough, shortness of breath, chest tightness, chest pain, turning blue, changes in mental status.  Reviewed expectations re: course of current medical issues. Questions answered. Outlined signs and symptoms indicating need for more acute intervention. Patient verbalized understanding. After Visit Summary given.         Faustino Congress, NP 04/14/20 1046

## 2020-04-14 NOTE — ED Triage Notes (Signed)
Patient reports he was exposed to his daughter and son in law who had covid. Also reports a nagging cough for a few days and a low grade fever yesterday. Requesting covid testing.

## 2020-04-14 NOTE — Discharge Instructions (Addendum)
Your COVID test is pending.  You should self quarantine until the test result is back.    Take Tylenol as needed for fever or discomfort.  Rest and keep yourself hydrated.    Go to the emergency department if you develop acute worsening symptoms.     

## 2020-04-15 LAB — SARS-COV-2, NAA 2 DAY TAT

## 2020-04-15 LAB — NOVEL CORONAVIRUS, NAA: SARS-CoV-2, NAA: DETECTED — AB

## 2020-04-16 ENCOUNTER — Telehealth: Payer: Self-pay | Admitting: *Deleted

## 2020-04-16 ENCOUNTER — Other Ambulatory Visit: Payer: Self-pay | Admitting: Oncology

## 2020-04-16 ENCOUNTER — Encounter: Payer: Self-pay | Admitting: Oncology

## 2020-04-16 DIAGNOSIS — U071 COVID-19: Secondary | ICD-10-CM

## 2020-04-16 NOTE — Telephone Encounter (Addendum)
Noted.  Lvm on [facility] hotline with pt's demographics.  Added pt to Call Log.  Spoke with pt relaying Dr. Synthia Innocent message.  Pt verbalizes understanding.  States he is scheduled for infusion on 04/19/20.

## 2020-04-16 NOTE — Telephone Encounter (Signed)
Agree with getting wife tested as well if she's symptomatic.  First day of symptoms was 8/20.  Patient would be a good candidate for mAb infusion treatment - please report demographics to infusion team.  Please place on covid call list through 8/31.  Continue zinc, vit D, vit C.

## 2020-04-16 NOTE — Telephone Encounter (Signed)
Patient called stating that he was tested for covid and does not understand the results. Patient was read the results in his chart. Patient tested positive for covid.  Patient stated that he started with a cough Friday. Patient stated that he went to be tested Tuesday and got the results today. Patient stated that he is in quarantine. Patient stated that his temperature has gotten up to 100.2 and is taking tylenol for his fever. Patient stated that he has a cough, upset stomach and some vomiting. Patient denies SOB, difficulty breathing, diarrhea or headache. Patient wants to know if there is anything else he should do. Patient was given ER precautions and verbalized understanding. Patient was advised to rest, drink plenty of fluids and eat well balanced meals. Patient stated that his wife has a sore throat and he was advise that she should be tested also and be in quarantine.  Advised patient that message will go back to Dr. Danise Mina for him to review.

## 2020-04-16 NOTE — Progress Notes (Signed)
I connected by phone with  Phillip [name] to discuss the potential use of an new treatment for mild to moderate COVID-19 viral infection in non-hospitalized patients.   This patient is a age/sex that meets the FDA criteria for Emergency Use Authorization of casirivimab\imdevimab.  Has a (+) direct SARS-CoV-2 viral test result 1. Has mild or moderate COVID-19  2. Is ? 80 years of age and weighs ? 40 kg 3. Is NOT hospitalized due to COVID-19 4. Is NOT requiring oxygen therapy or requiring an increase in baseline oxygen flow rate due to COVID-19 5. Is within 10 days of symptom onset 6. Has at least one of the high risk factor(s) for progression to severe COVID-19 and/or hospitalization as defined in EUA. ? Specific high risk criteria :age   Symptom onset  04/09/20   I have spoken and communicated the following to the patient or parent/caregiver:   1. FDA has authorized the emergency use of casirivimab\imdevimab for the treatment of mild to moderate COVID-19 in adults and pediatric patients with positive results of direct SARS-CoV-2 viral testing who are 67 years of age and older weighing at least 40 kg, and who are at high risk for progressing to severe COVID-19 and/or hospitalization.   2. The significant known and potential risks and benefits of casirivimab\imdevimab, and the extent to which such potential risks and benefits are unknown.   3. Information on available alternative treatments and the risks and benefits of those alternatives, including clinical trials.   4. Patients treated with casirivimab\imdevimab should continue to self-isolate and use infection control measures (e.g., wear mask, isolate, social distance, avoid sharing personal items, clean and disinfect "high touch" surfaces, and frequent handwashing) according to CDC guidelines.    5. The patient or parent/caregiver has the option to accept or refuse casirivimab\imdevimab .   After reviewing this information with the patient,  The patient agreed to proceed with receiving casirivimab\imdevimab infusion and will be provided a copy of the Fact sheet prior to receiving the infusion.Rulon Abide, AGNP-C [phone] ([place])

## 2020-04-17 ENCOUNTER — Ambulatory Visit (HOSPITAL_COMMUNITY)
Admission: RE | Admit: 2020-04-17 | Discharge: 2020-04-17 | Disposition: A | Discharge status: Home / Self Care | Payer: Medicare Other | Source: Ambulatory Visit | Attending: Pulmonary Disease | Admitting: Pulmonary Disease

## 2020-04-17 DIAGNOSIS — U071 COVID-19: Secondary | ICD-10-CM | POA: Insufficient documentation

## 2020-04-17 DIAGNOSIS — Z23 Encounter for immunization: Secondary | ICD-10-CM | POA: Diagnosis not present

## 2020-04-17 MED ORDER — FAMOTIDINE IN NACL 20-0.9 MG/50ML-% IV SOLN: 20.0000 mg | Freq: Once | INTRAVENOUS | Status: DC | PRN

## 2020-04-17 MED ORDER — EPINEPHRINE 0.3 MG/0.3ML IJ SOAJ: 0.3000 mg | Freq: Once | INTRAMUSCULAR | Status: DC | PRN

## 2020-04-17 MED ORDER — METHYLPREDNISOLONE SODIUM SUCC 125 MG IJ SOLR: 125.0000 mg | Freq: Once | INTRAMUSCULAR | Status: DC | PRN

## 2020-04-17 MED ORDER — ALBUTEROL SULFATE HFA 108 (90 BASE) MCG/ACT IN AERS: 2.0000 | INHALATION_SPRAY | Freq: Once | RESPIRATORY_TRACT | Status: DC | PRN

## 2020-04-17 MED ORDER — DIPHENHYDRAMINE HCL 50 MG/ML IJ SOLN: 50.0000 mg | Freq: Once | INTRAMUSCULAR | Status: DC | PRN

## 2020-04-17 MED ORDER — SODIUM CHLORIDE 0.9 % IV SOLN
1200.0000 mg | Freq: Once | INTRAVENOUS | Status: AC
  Administered 2020-04-17: 1200 mg via INTRAVENOUS
  Filled 2020-04-17: qty 10

## 2020-04-17 MED ORDER — SODIUM CHLORIDE 0.9 % IV SOLN: INTRAVENOUS | Status: DC | PRN

## 2020-04-17 NOTE — Discharge Instructions (Signed)

## 2020-04-17 NOTE — Progress Notes (Signed)
  Diagnosis: COVID-19  Physician: Patrick Wright, MD  Procedure: [facility] Med: casirivimab\imdevimab infusion - Provided patient with casirivimab\imdevimab fact sheet for patients, parents and caregivers prior to infusion.  Complications: No immediate complications noted.  Discharge: Discharged home   Rohn Fritsch N Dailon Sheeran 04/17/2020  

## 2020-04-20 MED ORDER — ONDANSETRON 4 MG PO TBDP: 4.0000 mg | ORAL_TABLET | Freq: Three times a day (TID) | ORAL | 0 refills | Status: AC | PRN

## 2020-04-20 NOTE — Addendum Note (Signed)
Addended by: [name] on: 04/20/2020 08:42 AM   Modules accepted: Orders

## 2020-04-20 NOTE — Telephone Encounter (Signed)
Spoke with pt asking for update.  Says he is feeling a little better.  No fever.  Still feels a little weak, mild los of smell, loss of taste and nausea.  Says the nausea is what bothers him the most and is not eating much because he does not want to vomit.  Pt is asking for something for the nausea so he can try eating.  Mentions he is drinking water, though.  F

## 2020-04-20 NOTE — Telephone Encounter (Signed)
plz notify I've sent in some zofran in for him.  Did wife test positive? To see if she qualifies for infusion treatment.

## 2020-04-20 NOTE — Telephone Encounter (Signed)
Spoke with pt relaying Dr. Synthia Innocent message.   Pt verbalizes understanding and states his wife is still waiting for results.  FYI to Dr. Darnell Level.

## 2020-04-25 ENCOUNTER — Other Ambulatory Visit: Payer: Self-pay | Admitting: Family Medicine

## 2020-04-25 DIAGNOSIS — E039 Hypothyroidism, unspecified: Secondary | ICD-10-CM

## 2020-04-25 DIAGNOSIS — R7303 Prediabetes: Secondary | ICD-10-CM

## 2020-04-25 DIAGNOSIS — I10 Essential (primary) hypertension: Secondary | ICD-10-CM

## 2020-04-25 DIAGNOSIS — D696 Thrombocytopenia, unspecified: Secondary | ICD-10-CM

## 2020-04-25 DIAGNOSIS — C099 Malignant neoplasm of tonsil, unspecified: Secondary | ICD-10-CM

## 2020-04-25 DIAGNOSIS — E785 Hyperlipidemia, unspecified: Secondary | ICD-10-CM

## 2020-04-27 ENCOUNTER — Emergency Department
Admission: EM | Admit: 2020-04-27 | Discharge: 2020-04-27 | Disposition: A | Discharge status: Home / Self Care | Payer: PPO | Attending: Emergency Medicine | Admitting: Emergency Medicine

## 2020-04-27 ENCOUNTER — Other Ambulatory Visit: Payer: Self-pay

## 2020-04-27 ENCOUNTER — Emergency Department: Payer: PPO

## 2020-04-27 ENCOUNTER — Encounter: Payer: Self-pay | Admitting: Emergency Medicine

## 2020-04-27 DIAGNOSIS — Z79890 Hormone replacement therapy (postmenopausal): Secondary | ICD-10-CM | POA: Insufficient documentation

## 2020-04-27 DIAGNOSIS — K5641 Fecal impaction: Secondary | ICD-10-CM | POA: Insufficient documentation

## 2020-04-27 DIAGNOSIS — I5022 Chronic systolic (congestive) heart failure: Secondary | ICD-10-CM | POA: Diagnosis not present

## 2020-04-27 DIAGNOSIS — Z7982 Long term (current) use of aspirin: Secondary | ICD-10-CM | POA: Diagnosis not present

## 2020-04-27 DIAGNOSIS — Z8589 Personal history of malignant neoplasm of other organs and systems: Secondary | ICD-10-CM | POA: Insufficient documentation

## 2020-04-27 DIAGNOSIS — Z85841 Personal history of malignant neoplasm of brain: Secondary | ICD-10-CM | POA: Insufficient documentation

## 2020-04-27 DIAGNOSIS — Z85818 Personal history of malignant neoplasm of other sites of lip, oral cavity, and pharynx: Secondary | ICD-10-CM | POA: Insufficient documentation

## 2020-04-27 DIAGNOSIS — K5900 Constipation, unspecified: Secondary | ICD-10-CM | POA: Diagnosis not present

## 2020-04-27 DIAGNOSIS — E039 Hypothyroidism, unspecified: Secondary | ICD-10-CM | POA: Diagnosis not present

## 2020-04-27 DIAGNOSIS — I4892 Unspecified atrial flutter: Secondary | ICD-10-CM | POA: Diagnosis not present

## 2020-04-27 DIAGNOSIS — K5909 Other constipation: Secondary | ICD-10-CM

## 2020-04-27 DIAGNOSIS — I2510 Atherosclerotic heart disease of native coronary artery without angina pectoris: Secondary | ICD-10-CM | POA: Insufficient documentation

## 2020-04-27 DIAGNOSIS — I110 Hypertensive heart disease with heart failure: Secondary | ICD-10-CM | POA: Insufficient documentation

## 2020-04-27 DIAGNOSIS — Z8616 Personal history of COVID-19: Secondary | ICD-10-CM | POA: Diagnosis not present

## 2020-04-27 DIAGNOSIS — Z79899 Other long term (current) drug therapy: Secondary | ICD-10-CM | POA: Diagnosis not present

## 2020-04-27 DIAGNOSIS — Z951 Presence of aortocoronary bypass graft: Secondary | ICD-10-CM | POA: Diagnosis not present

## 2020-04-27 MED ORDER — FLEET ENEMA 7-19 GM/118ML RE ENEM
1.0000 | ENEMA | Freq: Once | RECTAL | Status: AC
  Administered 2020-04-27: 1 via RECTAL

## 2020-04-27 MED ORDER — POLYETHYLENE GLYCOL 3350 17 G PO PACK
34.0000 g | PACK | Freq: Every day | ORAL | Status: DC
  Administered 2020-04-27: 34 g via ORAL
  Filled 2020-04-27: qty 2

## 2020-04-27 NOTE — Discharge Instructions (Signed)
At home, please use MiraLAX as we discussed.  For the next 2-3 days, please use a more aggressive MiraLAX regimen with 2 capfuls of the white powder mixed into your favorite noncarbonated drink.  Please drink this concoction 2-3 times per day.   After you have passed enough stool to feel some relief, please decrease the MiraLAX regimen to 1-2 capfuls per day only.  You will need to continue this regimen for 2-3 weeks to ensure stool passes easily through the rectum, and the stretching of her muscles can resolve to shrink everything down to a more normal size.  If you develop any worsening frontal abdominal pain, fevers or further inability to pass stool, please return to the ED.

## 2020-04-27 NOTE — ED Notes (Signed)
Pt had a very large bowel movement while sitting on bedside commode. Pt states "I feel so much better, I am ready to go now." MD [facility] notified.

## 2020-04-27 NOTE — ED Notes (Signed)
Wife updated on plan of care

## 2020-04-27 NOTE — ED Notes (Signed)
Wife updated

## 2020-04-27 NOTE — ED Notes (Signed)
Pt is currently on the bedside commode.. " Pt states that he is starting to feel relief." "I just let out a big one."

## 2020-04-27 NOTE — ED Triage Notes (Addendum)
Pt in via POV, reports constipation x 3-4 days, attempting use of fleets enema today but without success due to fecal impaction.  Reports generalized abdominal pain, denies N/V.  Pt is Covid(+).

## 2020-04-27 NOTE — ED Provider Notes (Signed)
[facility] Emergency Department Provider Note ____________________________________________   First MD Initiated Contact with Patient 04/27/20 1504     (approximate)  I have reviewed the triage vital signs and the nursing notes.  HISTORY  Chief Complaint Constipation and Fecal Impaction   HPI Phillip [name] is a 79 y.o. malewho presents to the ED for evaluation of constipation.   Chart review indicates history of CAD status post CABG, history of tonsillar and head/neck cancer. Patient reports that since his tonsillar cancer, his diet primarily consists of Glucerna shakes.  Patient reports by multiple cases of this per month, supplemented with some occasional solid food.  He reports difficulty with "normal food" due to his lack of salivation.  He reports being stable on this diet for a matter of 8 or 9 years.    Patient reports being vaccinated for COVID-19 and January 2021. He reports tested positive for Covid 1 week ago and has been managed at home with generalized weakness and nausea without vomiting.  Reports decreased p.o. intake and changes to his typical diet such that he has been unable to take his typical supplements and food balance. He reports generalized weakness without shortness of breath, chest pain, syncope or frontal abdominal pain.  He reports his last bowel movement was 3 to 4 days ago, which is atypical for him.  He reports increasing discomfort within this timeframe.  He reports he continues to pass gas and has no significant frontal abdominal pain.  He does report discomfort to his rectal area.  Reports a sensation that he needed to pass a BM this afternoon, so he went to the toilet but was unable to pass anything besides gas.  Due to this, he presents to the ED for evaluation.    Past Medical History:  Diagnosis Date  . Anxiety   . Atrial flutter ([place]) 01/07/2018   Post-op after CABG  . Basal cell carcinoma 09/12/2018   Left forehead above  lateral brow. Nodular pattern  . Cancer ([place])   . Coronary artery disease 2003   a. nonobstructive CAD by cath in 2003 and 2005; b.  Status post three-vessel CABG on 12/12/2017 with LIMA to LAD, sequential reverse SVG to OM1 and distal LCx  . Diverticulosis   . Hearing loss    bilateral  . Heart disease   . Heart murmur   . History of benign prostatic hypertrophy   . History of radiation therapy 01/31/10-03/22/10   right tonsil/right neck node 7000 cGy 35 sessions, high risk lymph node volume 5940 cGy 35 sessions, low risk lymph node vol 5600 cGy 35 sessions  . 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 HTN (hypertension)   . Hyperlipidemia    diet controlled- taking medication d/t ensure drinking  . Hypothyroid 01/16/2013  . Ischemic cardiomyopathy    a. 10/2017: echo showing reduced EF of 40-45%, HK of the anterior, anteroseptal and apical myocardium with Grade 1 DD.   Marland Kitchen Neck pain   . Squamous cell carcinoma    right tonsil- 2011  . Squamous cell carcinoma of skin 09/02/2019   Crown scalp. WD SCC  . Squamous cell carcinoma of skin 09/02/2019   Left distal lateral deltoid. SCCis arising in SK  . Thrombocytopenia ([place]) 02/02/2012   unclear etiology  . Tinnitus   . Tubular adenoma of colon     Patient Active Problem List   Diagnosis Date Noted  . Medicare annual wellness visit, subsequent 04/28/2019  . Chronic constipation  04/28/2019  . Chronic systolic heart failure ([place]) 03/20/2018  . Ischemic cardiomyopathy 03/20/2018  . Typical atrial flutter ([place]) 01/07/2018  . Decreased appetite 01/07/2018  . Chronic bilateral pleural effusions 01/07/2018  . Insomnia 12/21/2017  . S/P CABG x 3 12/12/2017  . Cardiomyopathy ([place]) 12/10/2017  . Detrusor instability of bladder 10/31/2017  . LVH (left ventricular hypertrophy) 10/24/2017  . Angioedema 06/19/2017  . Adverse effect of radiation 12/05/2016  . Advanced care planning/counseling discussion 11/06/2016  . Systolic murmur 62/83/6629  . Hearing  loss 02/03/2016  . Hyperlipidemia LDL goal <70 10/24/2015  . Prediabetes 08/20/2015  . Nail fungal infection 12/15/2013  . Right leg pain 11/05/2013  . Impotence, organic 11/04/2013  . Bilateral carotid artery stenosis 11/04/2013  . Hypothyroidism, acquired 01/16/2013  . Thrombocytopenia ([place]) 02/02/2012  . Health maintenance examination 09/21/2011  . Decreased libido 06/23/2010  . Malignant neoplasm of tonsil ([place]) 03/28/2010  . DIARRHEA 10/29/2009  . SINUS BRADYCARDIA 09/04/2008  . Essential hypertension 06/24/2007  . Coronary artery disease involving native coronary artery of native heart without angina pectoris 06/24/2007  . NECK PAIN 06/24/2007  . BPH associated with nocturia 06/24/2007    Past Surgical History:  Procedure Laterality Date  . artoscopic knee    . CARDIAC CATHETERIZATION  2003, 2005   40% blockage, two 30% blockages treated medically Ron Parker)  . CARDIOVERSION N/A 02/12/2018   Procedure: CARDIOVERSION;  Surgeon: [name], MD;  Location: [facility] ORS;  Service: Cardiovascular;  Laterality: N/A;  . COLONOSCOPY  03/2015   TA x3, HP, melanosis coli, severe diverticulosis, rpt 3 yrs ([place])  . COLONOSCOPY  01/2019   fair prep, diverticulosis, f/u PRN ([place])  . CORONARY ARTERY BYPASS GRAFT N/A 12/12/2017   Procedure: CORONARY ARTERY BYPASS GRAFTING (CABG) x3 using the right greater saphenous vein harvested endoscopically and the left internal mammary artery. LIMA to LAD, SEQ SVG to OM1 & OM2;  Surgeon: [name], MD;  Location: [facility];  Service: Open Heart Surgery;  Laterality: N/A;  . DENTAL SURGERY     extractions  . IR THORACENTESIS ASP PLEURAL SPACE W/IMG GUIDE  01/17/2018  . KNEE ARTHROSCOPY Left   . LEFT HEART CATH AND CORONARY ANGIOGRAPHY N/A 12/10/2017   Procedure: LEFT HEART CATH AND CORONARY ANGIOGRAPHY;  Surgeon: [name], MD;  Location: [facility] [place] [facility];  Service: Cardiovascular;  Laterality: N/A;  . PEG PLACEMENT  02/2010  . TEE  WITHOUT CARDIOVERSION N/A 12/12/2017   Procedure: TRANSESOPHAGEAL ECHOCARDIOGRAM (TEE);  Surgeon: [name], MD;  Location: [place];  Service: Open Heart Surgery;  Laterality: N/A;  . TONSILLECTOMY    . triple bypass  11/2017  . VASECTOMY      Prior to Admission medications   Medication Sig Start Date End Date Taking? Authorizing Provider  amLODipine (NORVASC) 10 MG tablet Take 1 tablet (10 mg total) by mouth daily. 06/25/19 12/23/28  End, Harrell Gave, MD  Ascorbic Acid (VITAMIN C PO) Take 500 mg by mouth 2 (two) times a day.     [provider]  aspirin EC 81 MG tablet Take 1 tablet (81 mg total) by mouth daily. 01/17/18   End, Harrell Gave, MD  Cholecalciferol (VITAMIN D3) 25 MCG (1000 UT) CAPS Take 1 capsule by mouth daily.    [provider]  docusate sodium (COLACE) 250 MG capsule Takes total 600mg  /day 04/25/19   [name], MD  EUTHYROX 25 MCG tablet TAKE 1 TABLET BY MOUTH ONCE DAILY BEFORE BREAKFAST 12/11/19   [name],  MD  furosemide (LASIX) 20 MG tablet Take 1 tablet (20 mg total) by mouth every other day. 06/25/19 12/23/28  End, Harrell Gave, MD  MAGNESIUM PO Take 1 tablet by mouth daily.    [provider]  Multiple Vitamin (MULTIVITAMIN) tablet Take 1 tablet by mouth daily.    [provider]  Multiple Vitamins-Minerals (ZINC PO) Take by mouth daily.    [provider]  nitroGLYCERIN (NITROSTAT) 0.4 MG SL tablet Place 1 tablet (0.4 mg total) under the tongue every 5 (five) minutes as needed for chest pain (Maximum 3 doses.). 12/24/19   End, Harrell Gave, MD  Omega-3 Fatty Acids (OMEGA 3 PO) Take by mouth daily.    [provider]  ondansetron (ZOFRAN-ODT) 4 MG disintegrating tablet Take 1 tablet (4 mg total) by mouth every 8 (eight) hours as needed for nausea or vomiting. 04/20/20   [name], MD  Potassium 99 MG TABS Take 1 tablet by mouth daily.    [provider]  predniSONE (DELTASONE) 20 MG tablet 2  tabs po daily for 5 days, then 1 tab po daily for 5 days 02/04/20   Copland, Frederico Hamman, MD  rosuvastatin (CRESTOR) 20 MG tablet Take 1 tablet (20 mg total) by mouth daily. 12/24/19   End, Harrell Gave, MD  VITAMIN A PO Take 2,400 mcg by mouth daily.     [provider]  vitamin E 400 UNIT capsule Take 400 Units by mouth daily.    [provider]  zaleplon (SONATA) 10 MG capsule Take 1 capsule (10 mg total) by mouth at bedtime as needed for sleep. 10/24/19   [name], MD  Zinc 50 MG CAPS Take 50 mg by mouth daily.    [provider]    Allergies Ace inhibitors and Morphine and related  Family History  Problem Relation Age of Onset  . Coronary artery disease Father   . Heart attack Father 50  . Heart disease Father   . Stroke Mother   . Colon cancer Neg Hx   . Esophageal cancer Neg Hx   . Rectal cancer Neg Hx   . Stomach cancer Neg Hx   . Kidney cancer Neg Hx   . Kidney failure Neg Hx   . Prostate cancer Neg Hx   . Tuberculosis Neg Hx     Social History Social History   Tobacco Use  . Smoking status: Never Smoker  . Smokeless tobacco: Never Used  Vaping Use  . Vaping Use: Never used  Substance Use Topics  . Alcohol use: Yes  . Drug use: No    Review of Systems  Constitutional: No fever/chills Eyes: No visual changes. ENT: No sore throat. Cardiovascular: Denies chest pain. Respiratory: Denies shortness of breath. Gastrointestinal: No abdominal pain.  no vomiting.  No diarrhea.  Positive for constipation, nausea and decreased p.o. intake.. Genitourinary: Negative for dysuria. Musculoskeletal: Negative for back pain. Skin: Negative for rash. Neurological: Negative for headaches, focal weakness or numbness. ____________________________________________   PHYSICAL EXAM:  VITAL SIGNS: Vitals:   04/27/20 1228  BP: 128/86  Pulse: 75  Resp: 19  SpO2: 95%      Constitutional: Alert and oriented. Well appearing and in no acute distress.   Sitting up in a bedside chair.  Pleasant and conversational full sentences without distress. Eyes: Conjunctivae are normal. PERRL. EOMI. Head: Atraumatic. Nose: No congestion/rhinnorhea. Mouth/Throat: Mucous membranes are moist.  Oropharynx non-erythematous. Neck: No stridor. No cervical spine tenderness to palpation. Cardiovascular: Normal rate, regular rhythm. Grossly normal  heart sounds.  Good peripheral circulation. Respiratory: Normal respiratory effort.  No retractions. Lungs CTAB. Gastrointestinal: Soft , nondistended, nontender to palpation. No abdominal bruits. No CVA tenderness. Benign frontal abdomen. GU: No external hemorrhoids or signs of external trauma.  No areas of induration or fluctuance externally. DRE demonstrates fecal impaction with hard stool within the rectal vault.  Musculoskeletal: No lower extremity tenderness nor edema.  No joint effusions. No signs of acute trauma. Neurologic:  Normal speech and language. No gross focal neurologic deficits are appreciated. No gait instability noted. Skin:  Skin is warm, dry and intact. No rash noted. Psychiatric: Mood and affect are normal. Speech and behavior are normal.  ____________________________________________   LABS (all labs ordered are listed, but only abnormal results are displayed)  Labs Reviewed - No data to display  ____________________________________________  RADIOLOGY  ED MD interpretation: KUB reviewed with evidence of constipation and stool impaction without evidence of SBO, free air or perforation.  Official radiology report(s): DG Abdomen 1 View  Result Date: 04/27/2020 CLINICAL DATA:  Constipation. EXAM: ABDOMEN - 1 VIEW COMPARISON:  No prior. FINDINGS: Soft tissue structures are unremarkable. No bowel distention. Stool noted throughout the colon and particularly the rectum. Constipation could present this fashion. No free air. Degenerative change lumbar spine and both hips. Aortoiliac and peripheral  vascular calcification. IMPRESSION: 1. Stool noted throughout the colon and particularly the rectum. Constipation could present in this fashion. No bowel distention or free air. 2.  Aortoiliac and peripheral vascular disease. Electronically Signed   By: Marcello Moores  Register   On: 04/27/2020 13:31    ____________________________________________   PROCEDURES and INTERVENTIONS  Procedure(s) performed (including Critical Care):  Fecal disimpaction  Date/Time: 04/27/2020 4:35 PM Performed by: Vladimir Crofts, MD Authorized by: Vladimir Crofts, MD  Local anesthesia used: no  Anesthesia: Local anesthesia used: no  Sedation: Patient sedated: no  Patient tolerance: patient tolerated the procedure well with no immediate complications     Medications  polyethylene glycol (MIRALAX / GLYCOLAX) packet 34 g (34 g Oral Given 04/27/20 1602)  sodium phosphate (FLEET) 7-19 GM/118ML enema 1 enema (1 enema Rectal Given 04/27/20 1602)    ____________________________________________   MDM / ED COURSE  79 year old male who is quite healthy and functional at baseline presents with 3-4 days of constipation with evidence of stool impaction amenable to bedside disimpaction and outpatient management.  Normal vital signs on room air.  Exam demonstrates a well-appearing patient without frontal abdominal pain, tenderness or peritoneal signs.  Rectal exam shows no hemorrhoids or external pathology, the patient is impacted with stool throughout his rectum.  KUB confirmed stool impaction and constipation without evidence of small bowel obstruction, perforation or other acute pathology.  Patient disimpacted at the bedside, provided 2 packets of MiraLAX and fleets enema with passage of large amounts of stool and resolution of his symptoms.  We discussed MiraLAX regimen as an outpatient and following up with PCP.  We discussed return precautions for the ED and patient is medically stable for discharge home.  Clinical Course as of  Apr 27 1653  Tue Apr 27, 2020  1608 Disimpaction complete.  Enema going in now.  Patient has tolerated 2 packets of MiraLAX.   [DS]  2025 Patient ambulates up to the workstation without distress and informs nursing staff and myself the patient has passed large bowel movement and feels much better.  Walked patient back to the room and we discussed outpatient management of constipation, including a prolonged MiraLAX regimen for a  matter of weeks to allow stool passage and his rectum to shrink to a more normal size.  We discussed return precautions for the ED and following up with his PCP.  Answered questions.   [DS]    Clinical Course User Index [DS] Vladimir Crofts, MD   ____________________________________________   FINAL CLINICAL IMPRESSION(S) / ED DIAGNOSES  Final diagnoses:  Other constipation  Fecal impaction [facility])     ED Discharge Orders    None       Devlin Brink   Note:  This document was prepared using Dragon voice recognition software and may include unintentional dictation errors.   Vladimir Crofts, MD 04/27/20 [phone]

## 2020-04-28 ENCOUNTER — Other Ambulatory Visit (INDEPENDENT_AMBULATORY_CARE_PROVIDER_SITE_OTHER): Payer: PPO

## 2020-04-28 ENCOUNTER — Ambulatory Visit (INDEPENDENT_AMBULATORY_CARE_PROVIDER_SITE_OTHER): Payer: PPO

## 2020-04-28 DIAGNOSIS — R7303 Prediabetes: Secondary | ICD-10-CM

## 2020-04-28 DIAGNOSIS — E039 Hypothyroidism, unspecified: Secondary | ICD-10-CM

## 2020-04-28 DIAGNOSIS — D696 Thrombocytopenia, unspecified: Secondary | ICD-10-CM

## 2020-04-28 DIAGNOSIS — E785 Hyperlipidemia, unspecified: Secondary | ICD-10-CM

## 2020-04-28 DIAGNOSIS — C099 Malignant neoplasm of tonsil, unspecified: Secondary | ICD-10-CM

## 2020-04-28 DIAGNOSIS — I10 Essential (primary) hypertension: Secondary | ICD-10-CM

## 2020-04-28 DIAGNOSIS — Z0000 Encounter for general adult medical examination without abnormal findings: Secondary | ICD-10-CM | POA: Diagnosis not present

## 2020-04-28 LAB — COMPREHENSIVE METABOLIC PANEL
ALT: 24 U/L (ref 0–53)
AST: 20 U/L (ref 0–37)
Albumin: 3.8 g/dL (ref 3.5–5.2)
Alkaline Phosphatase: 60 U/L (ref 39–117)
BUN: 14 mg/dL (ref 6–23)
CO2: 30 mEq/L (ref 19–32)
Calcium: 9.3 mg/dL (ref 8.4–10.5)
Chloride: 103 mEq/L (ref 96–112)
Creatinine, Ser: 0.75 mg/dL (ref 0.40–1.50)
GFR: 100.05 mL/min (ref >60.00)
Glucose, Bld: 123 mg/dL — ABNORMAL HIGH (ref 70–99)
Potassium: 4.5 mEq/L (ref 3.5–5.1)
Sodium: 141 mEq/L (ref 135–145)
Total Bilirubin: 1.1 mg/dL (ref 0.2–1.2)
Total Protein: 6.2 g/dL (ref 6.0–8.3)

## 2020-04-28 LAB — CBC WITH DIFFERENTIAL/PLATELET
Basophils Absolute: 0 10*3/uL (ref 0.0–0.1)
Basophils Relative: 0.2 % (ref 0.0–3.0)
Eosinophils Absolute: 0 10*3/uL (ref 0.0–0.7)
Eosinophils Relative: 0.1 % (ref 0.0–5.0)
HCT: 46.4 % (ref 39.0–52.0)
Hemoglobin: 15.8 g/dL (ref 13.0–17.0)
Lymphocytes Relative: 12.6 % (ref 12.0–46.0)
Lymphs Abs: 0.9 10*3/uL (ref 0.7–4.0)
MCHC: 34 g/dL (ref 30.0–36.0)
MCV: 97.2 fl (ref 78.0–100.0)
Monocytes Absolute: 0.6 10*3/uL (ref 0.1–1.0)
Monocytes Relative: 7.8 % (ref 3.0–12.0)
Neutro Abs: 5.9 10*3/uL (ref 1.4–7.7)
Neutrophils Relative %: 79.3 % — ABNORMAL HIGH (ref 43.0–77.0)
Platelets: 216 10*3/uL (ref 150.0–400.0)
RBC: 4.77 Mil/uL (ref 4.22–5.81)
RDW: 12.4 % (ref 11.5–15.5)
WBC: 7.4 10*3/uL (ref 4.0–10.5)

## 2020-04-28 LAB — MICROALBUMIN / CREATININE URINE RATIO
Creatinine,U: 176.1 mg/dL
Microalb Creat Ratio: 2.2 mg/g (ref 0.0–30.0)
Microalb, Ur: 3.9 mg/dL — ABNORMAL HIGH (ref 0.0–1.9)

## 2020-04-28 LAB — LIPID PANEL
Cholesterol: 96 mg/dL (ref 0–200)
HDL: 36.2 mg/dL — ABNORMAL LOW (ref >39.00)
LDL Cholesterol: 45 mg/dL (ref 0–99)
NonHDL: 59.94
Total CHOL/HDL Ratio: 3
Triglycerides: 74 mg/dL (ref 0.0–149.0)
VLDL: 14.8 mg/dL (ref 0.0–40.0)

## 2020-04-28 LAB — TSH: TSH: 3.27 u[IU]/mL (ref 0.35–4.50)

## 2020-04-28 LAB — HEMOGLOBIN A1C: Hgb A1c MFr Bld: 6.3 % (ref 4.6–6.5)

## 2020-04-28 NOTE — Progress Notes (Signed)
Subjective:   Phillip [name] is a 79 y.o. male who presents for Medicare Annual/Subsequent preventive examination.  Review of Systems: N/A      I connected with the patient today by telephone and verified that I am speaking with the correct person using two identifiers. Location patient: home Location nurse: work Persons participating in the telephone visit: patient, nurse.   I discussed the limitations, risks, security and privacy concerns of performing an evaluation and management service by telephone and the availability of in person appointments. I also discussed with the patient that there may be a patient responsible charge related to this service. The patient expressed understanding and verbally consented to this telephonic visit.        Cardiac Risk Factors include: advanced age (>6men, >76 women);dyslipidemia;male gender     Objective:    Today's Vitals   04/28/20 1026  PainSc: 0-No pain   There is no height or weight on file to calculate BMI.  Advanced Directives 04/28/2020 04/27/2020 02/07/2018 12/14/2017 12/11/2017 12/10/2017 11/07/2017  Does Patient Have a Medical Advance Directive? Yes Yes No - No Yes Yes  Type of Paramedic of [place];Living will [place];Living will - - Special educational needs teacher of [place];Living will [place];Living will  Does patient want to make changes to medical advance directive? - No - Patient declined - - - - -  Copy of [place] in Chart? No - copy requested No - copy requested - - No - copy requested No - copy requested No - copy requested  Would patient like information on creating a medical advance directive? - - No - Patient declined No - Patient declined - - -    Current Medications (verified) Outpatient Encounter Medications as of 04/28/2020  Medication Sig  . amLODipine (NORVASC) 10 MG tablet Take 1 tablet (10 mg total) by mouth daily.    . Ascorbic Acid (VITAMIN C PO) Take 500 mg by mouth 2 (two) times a day.   Marland Kitchen aspirin EC 81 MG tablet Take 1 tablet (81 mg total) by mouth daily.  . Cholecalciferol (VITAMIN D3) 25 MCG (1000 UT) CAPS Take 1 capsule by mouth daily.  Marland Kitchen docusate sodium (COLACE) 250 MG capsule Takes total 600mg  /day  . EUTHYROX 25 MCG tablet TAKE 1 TABLET BY MOUTH ONCE DAILY BEFORE BREAKFAST  . furosemide (LASIX) 20 MG tablet Take 1 tablet (20 mg total) by mouth every other day.  Marland Kitchen MAGNESIUM PO Take 1 tablet by mouth daily.  . Multiple Vitamin (MULTIVITAMIN) tablet Take 1 tablet by mouth daily.  . Multiple Vitamins-Minerals (ZINC PO) Take by mouth daily.  . nitroGLYCERIN (NITROSTAT) 0.4 MG SL tablet Place 1 tablet (0.4 mg total) under the tongue every 5 (five) minutes as needed for chest pain (Maximum 3 doses.).  Marland Kitchen Omega-3 Fatty Acids (OMEGA 3 PO) Take by mouth daily.  . ondansetron (ZOFRAN-ODT) 4 MG disintegrating tablet Take 1 tablet (4 mg total) by mouth every 8 (eight) hours as needed for nausea or vomiting.  . Potassium 99 MG TABS Take 1 tablet by mouth daily.  . predniSONE (DELTASONE) 20 MG tablet 2 tabs po daily for 5 days, then 1 tab po daily for 5 days  . rosuvastatin (CRESTOR) 20 MG tablet Take 1 tablet (20 mg total) by mouth daily.  Marland Kitchen VITAMIN A PO Take 2,400 mcg by mouth daily.   . vitamin E 400 UNIT capsule Take 400 Units by mouth  daily.  . zaleplon (SONATA) 10 MG capsule Take 1 capsule (10 mg total) by mouth at bedtime as needed for sleep.  Marland Kitchen Zinc 50 MG CAPS Take 50 mg by mouth daily.   No facility-administered encounter medications on file as of 04/28/2020.    Allergies (verified) Ace inhibitors and Morphine and related   History: Past Medical History:  Diagnosis Date  . Anxiety   . Atrial flutter ([place]) 01/07/2018   Post-op after CABG  . Basal cell carcinoma 09/12/2018   Left forehead above lateral brow. Nodular pattern  . Cancer ([place])   . Coronary artery disease 2003   a. nonobstructive  CAD by cath in 2003 and 2005; b.  Status post three-vessel CABG on 12/12/2017 with LIMA to LAD, sequential reverse SVG to OM1 and distal LCx  . Diverticulosis   . Hearing loss    bilateral  . Heart disease   . Heart murmur   . History of benign prostatic hypertrophy   . History of radiation therapy 01/31/10-03/22/10   right tonsil/right neck node 7000 cGy 35 sessions, high risk lymph node volume 5940 cGy 35 sessions, low risk lymph node vol 5600 cGy 35 sessions  . 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 HTN (hypertension)   . Hyperlipidemia    diet controlled- taking medication d/t ensure drinking  . Hypothyroid 01/16/2013  . Ischemic cardiomyopathy    a. 10/2017: echo showing reduced EF of 40-45%, HK of the anterior, anteroseptal and apical myocardium with Grade 1 DD.   Marland Kitchen Neck pain   . Squamous cell carcinoma    right tonsil- 2011  . Squamous cell carcinoma of skin 09/02/2019   Crown scalp. WD SCC  . Squamous cell carcinoma of skin 09/02/2019   Left distal lateral deltoid. SCCis arising in SK  . Thrombocytopenia ([place]) 02/02/2012   unclear etiology  . Tinnitus   . Tubular adenoma of colon    Past Surgical History:  Procedure Laterality Date  . artoscopic knee    . CARDIAC CATHETERIZATION  2003, 2005   40% blockage, two 30% blockages treated medically Ron Parker)  . CARDIOVERSION N/A 02/12/2018   Procedure: CARDIOVERSION;  Surgeon: [name], MD;  Location: [facility];  Service: Cardiovascular;  Laterality: N/A;  . COLONOSCOPY  03/2015   TA x3, HP, melanosis coli, severe diverticulosis, rpt 3 yrs ([place])  . COLONOSCOPY  01/2019   fair prep, diverticulosis, f/u PRN ([place])  . CORONARY ARTERY BYPASS GRAFT N/A 12/12/2017   Procedure: CORONARY ARTERY BYPASS GRAFTING (CABG) x3 using the right greater saphenous vein harvested endoscopically and the left internal mammary artery. LIMA to LAD, SEQ SVG to OM1 & OM2;  Surgeon: [name], MD;  Location: [facility];  Service: Open Heart Surgery;   Laterality: N/A;  . DENTAL SURGERY     extractions  . IR THORACENTESIS ASP PLEURAL SPACE W/IMG GUIDE  01/17/2018  . KNEE ARTHROSCOPY Left   . LEFT HEART CATH AND CORONARY ANGIOGRAPHY N/A 12/10/2017   Procedure: LEFT HEART CATH AND CORONARY ANGIOGRAPHY;  Surgeon: [name], MD;  Location: [facility];  Service: Cardiovascular;  Laterality: N/A;  . PEG PLACEMENT  02/2010  . TEE WITHOUT CARDIOVERSION N/A 12/12/2017   Procedure: TRANSESOPHAGEAL ECHOCARDIOGRAM (TEE);  Surgeon: [name], MD;  Location: [facility];  Service: Open Heart Surgery;  Laterality: N/A;  . TONSILLECTOMY    . triple bypass  11/2017  . VASECTOMY     Family History  Problem Relation Age of  Onset  . Coronary artery disease Father   . Heart attack Father 40  . Heart disease Father   . Stroke Mother   . Colon cancer Neg Hx   . Esophageal cancer Neg Hx   . Rectal cancer Neg Hx   . Stomach cancer Neg Hx   . Kidney cancer Neg Hx   . Kidney failure Neg Hx   . Prostate cancer Neg Hx   . Tuberculosis Neg Hx    Social History   Socioeconomic History  . Marital status: Married    Spouse name: Fraser Din  . Number of children: 1  . Years of education: Not on file  . Highest education level: Not on file  Occupational History  . Not on file  Tobacco Use  . Smoking status: Never Smoker  . Smokeless tobacco: Never Used  Vaping Use  . Vaping Use: Never used  Substance and Sexual Activity  . Alcohol use: Yes    Comment: wine 1-2 times a month   . Drug use: No  . Sexual activity: Yes  Other Topics Concern  . 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    Plays golf, remains very active with an interest in politics. Jan 2011 moved to area.    Involved in [employer].    He is a runner - has run [employer] and [employer]. S/p torn meniscus.    Social Determinants of Health   Financial Resource Strain: Low Risk   . Difficulty of Paying Living Expenses: Not hard at all  Food Insecurity: No Food Insecurity  . Worried About Charity fundraiser in the Last Year: Never true  . Ran Out of Food in the Last Year: Never true  Transportation Needs: No Transportation Needs  . Lack of Transportation (Medical): No  . Lack of Transportation (Non-Medical): No  Physical Activity: Sufficiently Active  . Days of Exercise per Week: 7 days  . Minutes of Exercise per Session: 30 min  Stress: No Stress Concern Present  . Feeling of Stress : Not at all  Social Connections:   . Frequency of Communication with Friends and Family: Not on file  . Frequency of Social Gatherings with Friends and Family: Not on file  . Attends Religious Services: Not on file  . Active Member of Clubs or Organizations: Not on file  . Attends Archivist Meetings: Not on file  . Marital Status: Not on file    Tobacco Counseling Counseling given: Not Answered   Clinical Intake:  Pre-visit preparation completed: Yes  Pain : No/denies pain Pain Score: 0-No pain     Nutritional Risks: Nausea/ vomitting/ diarrhea (nausea due to COVID) Diabetes: No  How often do you need to have someone help you when you read instructions, pamphlets, or other written materials from your doctor or pharmacy?: 1 - Never What is the last grade level you completed in school?: masters  Diabetic: No Nutrition Risk Assessment:  Has the patient had any N/V/D within the last 2 months?  Yes , nausea due to COVID Does the patient have any non-healing wounds?  No  Has the patient had any unintentional weight loss or weight gain?  No   Diabetes:  Is the patient diabetic?  No  If diabetic, was a CBG obtained today?  N/A Did the patient bring in their glucometer from home?  N/A How often do you monitor your CBG's? N/A.   Financial Strains and  Diabetes Management:  Are you having any financial strains with the device, your supplies or your medication? N/A.  Does the patient want to be seen by Chronic Care Management for management of their diabetes?  N/A Would the patient like to be referred to a Nutritionist or for Diabetic Management?  N/A    Interpreter Needed?: No  Information entered by :: CJohnson, LPN   Activities of Daily Living In your present state of health, do you have any difficulty performing the following activities: 04/28/2020  Hearing? Y  Comment wears hearing aids  Vision? N  Difficulty concentrating or making decisions? N  Walking or climbing stairs? N  Dressing or bathing? N  Doing errands, shopping? N  Preparing Food and eating ? N  Using the Toilet? N  In the past six months, have you accidently leaked urine? Y  Comment wears depends  Do you have problems with loss of bowel control? N  Managing your Medications? N  Managing your Finances? N  Housekeeping or managing your Housekeeping? N  Some recent data might be hidden    Patient Care Team: [name], MD as PCP - General (Family Medicine) End, Harrell Gave, MD as PCP - Cardiology (Cardiology) Rozetta Nunnery, MD Arloa Koh, MD (Inactive) (Radiation Oncology) Heath Lark, MD as Consulting Physician (Hematology and Oncology) Marijean Niemann, OD as Consulting Physician (Optometry)  Indicate any recent Medical Services you may have received from other than [facility] providers in the past year (date may be approximate).     Assessment:   This is a routine wellness examination for [place].  Hearing/Vision screen  Hearing Screening   125Hz  250Hz  500Hz  1000Hz  2000Hz  3000Hz  4000Hz  6000Hz  8000Hz   Right ear:           Left ear:           Vision Screening Comments: Patient gets annual eye exams.  Dietary issues and exercise activities discussed: Current Exercise Habits: Home exercise routine, Type of exercise: walking;Other - see  comments (golf), Time (Minutes): 30, Frequency (Times/Week): 7, Weekly Exercise (Minutes/Week): 210, Intensity: Moderate, Exercise limited by: None identified  Goals    . Increase physical activity     Starting 05/29/2016, I will continue golfing, walking and weight machines for 3-5 days per week for at least 30 minutes per day.     . Increase physical activity     Starting 11/07/2017, I will continue to take medications as prescribed and to keep appointments with PCP as scheduled.     . Patient Stated     04/28/2020, I will continue to walk daily for about 30 minutes. I will continue to play golf once a week for about 4 hours.       Depression Screen PHQ 2/9 Scores 04/28/2020 04/25/2019 02/07/2018 11/07/2017 12/18/2016 05/29/2016 10/21/2015  PHQ - 2 Score 0 0 0 0 0 0 0  PHQ- 9 Score 0 - 4 0 - - -    Fall Risk Fall Risk  04/28/2020 04/25/2019 02/07/2018 11/07/2017 12/18/2016  Falls in the past year? 0 0 No No No  Number falls in past yr: 0 - - - -  Injury with Fall? 0 - - - -  Risk for fall due to : Medication side effect - - - -  Follow up Falls evaluation completed;Falls prevention discussed - - - -  Any stairs in or around the home? Yes  If so, are there any without handrails? No  Home free of loose throw rugs in walkways, pet beds, electrical cords, etc? Yes  Adequate lighting in your home to reduce risk of falls? Yes   ASSISTIVE DEVICES UTILIZED TO PREVENT FALLS:  Life alert? No  Use of a cane, walker or w/c? No  Grab bars in the bathroom? No  Shower chair or bench in shower? No  Elevated toilet seat or a handicapped toilet? No   TIMED UP AND GO:  Was the test performed? N/A, telephonic visit.   Cognitive Function: MMSE - Mini Mental State Exam 04/28/2020 11/07/2017 05/29/2016  Orientation to time 5 5 5   Orientation to Place 5 5 5   Registration 3 3 3   Attention/ Calculation 5 0 0  Recall 3 0 3  Recall-comments - unable to recall 3 of 3 words -  Language- name 2 objects - 0 0    Language- repeat 1 1 1   Language- follow 3 step command - 3 3  Language- read & follow direction - 0 0  Write a sentence - 0 0  Copy design - 0 0  Total score - 17 20  Mini Cog  Mini-Cog screen was completed. Maximum score is 22. A value of 0 denotes this part of the MMSE was not completed or the patient failed this part of the Mini-Cog screening.       Immunizations Immunization History  Administered Date(s) Administered  . Fluad Quad(high Dose 65+) 04/25/2019  . H1N1 07/21/2008  . Influenza Split 06/20/2012  . Influenza Whole 07/02/2006, 05/21/2008, 05/21/2009  . Influenza, High Dose Seasonal PF 06/25/2015  . Influenza,inj,Quad PF,6+ Mos 05/25/2014, 05/29/2016, 05/01/2017, 05/20/2018  . Influenza-Unspecified 05/21/2013, 06/06/2015  . PFIZER SARS-COV-2 Vaccination 10/03/2019, 10/24/2019  . Pneumococcal Conjugate-13 11/05/2013, 05/25/2014  . Pneumococcal Polysaccharide-23 07/02/2006, 06/25/2015  . Td 09/14/2008  . Tdap 06/16/2019  . Zoster Recombinat (Shingrix) 06/16/2019     TDAP status: Up to date Flu Vaccine status: due, will get at upcoming office visit  Pneumococcal vaccine status: Up to date Covid-19 vaccine status: Completed vaccines  Qualifies for Shingles Vaccine? Yes   Zostavax completed No   Shingrix Completed: #1 completed 06/16/2019, #2 due Patient is aware   Screening Tests Health Maintenance  Topic Date Due  . DTAP VACCINES (1) 11/19/1940  . INFLUENZA VACCINE  03/21/2020  . COLONOSCOPY  01/15/2022  . DTaP/Tdap/Td (3 - Td or Tdap) 06/15/2029  . TETANUS/TDAP  06/15/2029  . COVID-19 Vaccine  Completed  . PNA vac Low Risk Adult  Completed    Health Maintenance  Health Maintenance Due  Topic Date Due  . DTAP VACCINES (1) 11/19/1940  . INFLUENZA VACCINE  03/21/2020    Colorectal cancer screening: Completed 01/16/2019. Repeat every 3 years  Lung Cancer Screening: (Low Dose CT Chest recommended if Age 28-80 years, 30 pack-year currently smoking OR  have quit w/in 15 years.) does not qualify.    Additional Screening:  Hepatitis C Screening: does not qualify; Completed N/A  Vision Screening: Recommended annual ophthalmology exams for early detection of glaucoma and other disorders of the eye. Is the patient up to date with their annual eye exam?  Yes  Who is the provider or what is the name of the office in which the patient attends annual eye exams? [facility]  If pt is not established with a provider, would they like to be referred to a provider to establish care?  No .   Dental Screening: Recommended annual dental exams for proper oral hygiene  Community Resource Referral / Chronic Care Management: CRR required this visit?  No   CCM required this visit?  No      Plan:     I have personally reviewed and noted the following in the patient's chart:   . Medical and social history . Use of alcohol, tobacco or illicit drugs  . Current medications and supplements . Functional ability and status . Nutritional status . Physical activity . Advanced directives . List of other physicians . Hospitalizations, surgeries, and ER visits in previous 12 months . Vitals . Screenings to include cognitive, depression, and falls . Referrals and appointments  In addition, I have reviewed and discussed with patient certain preventive protocols, quality metrics, and best practice recommendations. A written personalized care plan for preventive services as well as general preventive health recommendations were provided to patient.   Due to this being a telephonic visit, the after visit summary with patients personalized plan was offered to patient via mail or my-chart. Patient preferred to pick up at office at next visit.   Andrez Grime, LPN   10/26/5434

## 2020-04-28 NOTE — Patient Instructions (Signed)
Phillip [name] , Thank you for taking time to come for your Medicare Wellness Visit. I appreciate your ongoing commitment to your health goals. Please review the following plan we discussed and let me know if I can assist you in the future.   Screening recommendations/referrals: Colonoscopy: Up to date, completed 01/16/2019, due 12/2021 Recommended yearly ophthalmology/optometry visit for glaucoma screening and checkup Recommended yearly dental visit for hygiene and checkup  Vaccinations: Influenza vaccine: due, will get at upcoming office visit  Pneumococcal vaccine: Completed series Tdap vaccine: Up to date, completed 06/16/2019, due 05/2029 Shingles vaccine: #2 due, Patient will get at later date.   Covid-19: Completed series  Advanced directives: Please bring a copy of your POA (Power of Attorney) and/or Living Will to your next appointment.   Conditions/risks identified: hyperlipidemia  Next appointment: Follow up in one year for your annual wellness visit.   Preventive Care 75 Years and Older, Male Preventive care refers to lifestyle choices and visits with your health care provider that can promote health and wellness. What does preventive care include?  A yearly physical exam. This is also called an annual well check.  Dental exams once or twice a year.  Routine eye exams. Ask your health care provider how often you should have your eyes checked.  Personal lifestyle choices, including:  Daily care of your teeth and gums.  Regular physical activity.  Eating a healthy diet.  Avoiding tobacco and drug use.  Limiting alcohol use.  Practicing safe sex.  Taking low doses of aspirin every day.  Taking vitamin and mineral supplements as recommended by your health care provider. What happens during an annual well check? The services and screenings done by your health care provider during your annual well check will depend on your age, overall health, lifestyle risk factors,  and family history of disease. Counseling  Your health care provider may ask you questions about your:  Alcohol use.  Tobacco use.  Drug use.  Emotional well-being.  Home and relationship well-being.  Sexual activity.  Eating habits.  History of falls.  Memory and ability to understand (cognition).  Work and work Statistician. Screening  You may have the following tests or measurements:  Height, weight, and BMI.  Blood pressure.  Lipid and cholesterol levels. These may be checked every 5 years, or more frequently if you are over 42 years old.  Skin check.  Lung cancer screening. You may have this screening every year starting at age 80 if you have a 30-pack-year history of smoking and currently smoke or have quit within the past 15 years.  Fecal occult blood test (FOBT) of the stool. You may have this test every year starting at age 80.  Flexible sigmoidoscopy or colonoscopy. You may have a sigmoidoscopy every 5 years or a colonoscopy every 10 years starting at age 80.  Prostate cancer screening. Recommendations will vary depending on your family history and other risks.  Hepatitis C blood test.  Hepatitis B blood test.  Sexually transmitted disease (STD) testing.  Diabetes screening. This is done by checking your blood sugar (glucose) after you have not eaten for a while (fasting). You may have this done every 1-3 years.  Abdominal aortic aneurysm (AAA) screening. You may need this if you are a current or former smoker.  Osteoporosis. You may be screened starting at age 80 if you are at high risk. Talk with your health care provider about your test results, treatment options, and if necessary, the need for more  tests. Vaccines  Your health care provider may recommend certain vaccines, such as:  Influenza vaccine. This is recommended every year.  Tetanus, diphtheria, and acellular pertussis (Tdap, Td) vaccine. You may need a Td booster every 10  years.  Zoster vaccine. You may need this after age 80.  Pneumococcal 13-valent conjugate (PCV13) vaccine. One dose is recommended after age 80.  Pneumococcal polysaccharide (PPSV23) vaccine. One dose is recommended after age 80. Talk to your health care provider about which screenings and vaccines you need and how often you need them. This information is not intended to replace advice given to you by your health care provider. Make sure you discuss any questions you have with your health care provider. Document Released: 09/03/2015 Document Revised: 04/26/2016 Document Reviewed: 06/08/2015 Elsevier Interactive Patient Education  2017 [place] Prevention in the Home Falls can cause injuries. They can happen to people of all ages. There are many things you can do to make your home safe and to help prevent falls. What can I do on the outside of my home?  Regularly fix the edges of walkways and driveways and fix any cracks.  Remove anything that might make you trip as you walk through a door, such as a raised step or threshold.  Trim any bushes or trees on the path to your home.  Use bright outdoor lighting.  Clear any walking paths of anything that might make someone trip, such as rocks or tools.  Regularly check to see if handrails are loose or broken. Make sure that both sides of any steps have handrails.  Any raised decks and porches should have guardrails on the edges.  Have any leaves, snow, or ice cleared regularly.  Use sand or salt on walking paths during winter.  Clean up any spills in your garage right away. This includes oil or grease spills. What can I do in the bathroom?  Use night lights.  Install grab bars by the toilet and in the tub and shower. Do not use towel bars as grab bars.  Use non-skid mats or decals in the tub or shower.  If you need to sit down in the shower, use a plastic, non-slip stool.  Keep the floor dry. Clean up any water that  spills on the floor as soon as it happens.  Remove soap buildup in the tub or shower regularly.  Attach bath mats securely with double-sided non-slip rug tape.  Do not have throw rugs and other things on the floor that can make you trip. What can I do in the bedroom?  Use night lights.  Make sure that you have a light by your bed that is easy to reach.  Do not use any sheets or blankets that are too big for your bed. They should not hang down onto the floor.  Have a firm chair that has side arms. You can use this for support while you get dressed.  Do not have throw rugs and other things on the floor that can make you trip. What can I do in the kitchen?  Clean up any spills right away.  Avoid walking on wet floors.  Keep items that you use a lot in easy-to-reach places.  If you need to reach something above you, use a strong step stool that has a grab bar.  Keep electrical cords out of the way.  Do not use floor polish or wax that makes floors slippery. If you must use wax, use non-skid floor  wax.  Do not have throw rugs and other things on the floor that can make you trip. What can I do with my stairs?  Do not leave any items on the stairs.  Make sure that there are handrails on both sides of the stairs and use them. Fix handrails that are broken or loose. Make sure that handrails are as long as the stairways.  Check any carpeting to make sure that it is firmly attached to the stairs. Fix any carpet that is loose or worn.  Avoid having throw rugs at the top or bottom of the stairs. If you do have throw rugs, attach them to the floor with carpet tape.  Make sure that you have a light switch at the top of the stairs and the bottom of the stairs. If you do not have them, ask someone to add them for you. What else can I do to help prevent falls?  Wear shoes that:  Do not have high heels.  Have rubber bottoms.  Are comfortable and fit you well.  Are closed at the  toe. Do not wear sandals.  If you use a stepladder:  Make sure that it is fully opened. Do not climb a closed stepladder.  Make sure that both sides of the stepladder are locked into place.  Ask someone to hold it for you, if possible.  Clearly mark and make sure that you can see:  Any grab bars or handrails.  First and last steps.  Where the edge of each step is.  Use tools that help you move around (mobility aids) if they are needed. These include:  Canes.  Walkers.  Scooters.  Crutches.  Turn on the lights when you go into a dark area. Replace any light bulbs as soon as they burn out.  Set up your furniture so you have a clear path. Avoid moving your furniture around.  If any of your floors are uneven, fix them.  If there are any pets around you, be aware of where they are.  Review your medicines with your doctor. Some medicines can make you feel dizzy. This can increase your chance of falling. Ask your doctor what other things that you can do to help prevent falls. This information is not intended to replace advice given to you by your health care provider. Make sure you discuss any questions you have with your health care provider. Document Released: 06/03/2009 Document Revised: 01/13/2016 Document Reviewed: 09/11/2014 Elsevier Interactive Patient Education  2017 [employer].

## 2020-04-28 NOTE — Progress Notes (Signed)
PCP notes:  Health Maintenance: Flu- due Shingrix #2- due   Abnormal Screenings: none   Patient concerns: Urinary frequency at night Low energy level    Nurse concerns: none   Next PCP appt: 04/30/2020 @ 10:30 am

## 2020-04-30 ENCOUNTER — Ambulatory Visit (INDEPENDENT_AMBULATORY_CARE_PROVIDER_SITE_OTHER): Payer: PPO | Admitting: Family Medicine

## 2020-04-30 ENCOUNTER — Encounter: Payer: Self-pay | Admitting: Family Medicine

## 2020-04-30 ENCOUNTER — Other Ambulatory Visit: Payer: Self-pay

## 2020-04-30 VITALS — BP 124/68 | HR 74 | Temp 98.0°F | Ht 71.0 in | Wt 199.0 lb

## 2020-04-30 DIAGNOSIS — I5022 Chronic systolic (congestive) heart failure: Secondary | ICD-10-CM

## 2020-04-30 DIAGNOSIS — I255 Ischemic cardiomyopathy: Secondary | ICD-10-CM

## 2020-04-30 DIAGNOSIS — Z7189 Other specified counseling: Secondary | ICD-10-CM

## 2020-04-30 DIAGNOSIS — E039 Hypothyroidism, unspecified: Secondary | ICD-10-CM | POA: Diagnosis not present

## 2020-04-30 DIAGNOSIS — Z23 Encounter for immunization: Secondary | ICD-10-CM | POA: Diagnosis not present

## 2020-04-30 DIAGNOSIS — I2510 Atherosclerotic heart disease of native coronary artery without angina pectoris: Secondary | ICD-10-CM

## 2020-04-30 DIAGNOSIS — K5909 Other constipation: Secondary | ICD-10-CM | POA: Diagnosis not present

## 2020-04-30 DIAGNOSIS — Z85818 Personal history of malignant neoplasm of other sites of lip, oral cavity, and pharynx: Secondary | ICD-10-CM

## 2020-04-30 DIAGNOSIS — I6523 Occlusion and stenosis of bilateral carotid arteries: Secondary | ICD-10-CM | POA: Diagnosis not present

## 2020-04-30 DIAGNOSIS — R7303 Prediabetes: Secondary | ICD-10-CM

## 2020-04-30 DIAGNOSIS — E785 Hyperlipidemia, unspecified: Secondary | ICD-10-CM

## 2020-04-30 DIAGNOSIS — N3281 Overactive bladder: Secondary | ICD-10-CM

## 2020-04-30 DIAGNOSIS — I10 Essential (primary) hypertension: Secondary | ICD-10-CM | POA: Diagnosis not present

## 2020-04-30 DIAGNOSIS — N401 Enlarged prostate with lower urinary tract symptoms: Secondary | ICD-10-CM | POA: Diagnosis not present

## 2020-04-30 DIAGNOSIS — R351 Nocturia: Secondary | ICD-10-CM

## 2020-04-30 DIAGNOSIS — D696 Thrombocytopenia, unspecified: Secondary | ICD-10-CM

## 2020-04-30 NOTE — Progress Notes (Signed)
This visit was conducted in person.  BP 124/68 (BP Location: Left Arm, Patient Position: Sitting, Cuff Size: Normal)   Pulse 74   Temp 98 F (36.7 C) (Temporal)   Ht 5\' 11"  (1.803 m)   Wt 199 lb (90.3 kg)   SpO2 96%   BMI 27.75 kg/m    CC: AMW f/u visit  Subjective:    Patient ID: Phillip [name], male    DOB: 06/13/1941, 79 y.o.   MRN: [id number]  HPI: Phillip [name] is a 79 y.o. male presenting on 04/30/2020 for Annual Exam (Prt 2. )   Saw health advisor this week for medicare wellness visit. Note reviewed.    No exam data present    Clinical Support from 04/28/2020 in [place] at [facility] Total Score 0      Fall Risk  04/28/2020 04/25/2019 02/07/2018 11/07/2017 12/18/2016  Falls in the past year? 0 0 No No No  Number falls in past yr: 0 - - - -  Injury with Fall? 0 - - - -  Risk for fall due to : Medication side effect - - - -  Follow up Falls evaluation completed;Falls prevention discussed - - - -    Recent COVID illness late last month s/p mAb infusion treatment, recovering well.  Seen in interim at ER on Tuesday with fecal impaction s/p disimpaction and enema likely related to diet and recent COVID illness (loss of taste) - currently on miralax regimen.   Earlier this year we started levothyroxine 39mcg for borderline hypothyroidism. Tolerating well but no significant improvement in energy levels.   Preventative: COLONOSCOPY 03/2015 TA x3, HP, melanosis coli, severe diverticulosis, rpt 3 yrs (Pyrtle) COLONOSCOPY 12/2018 - fair prep, diverticulosis, f/u PRN (Pyrtle) Prostate cancer screening -prior saw urology (Dr Risa Grill). Ongoing LUTS and nocturia. Established with Dr Diamantina Providence - see above Lung cancer screening -not eligible Flu shot -yearly  [place] 09/2019, 10/2019  Td 2010, Tdap 05/2019  Pneumovax 2007, 2016, prevnar 2015 x2  Zostavax 2006 Shingrix - 05/2019, rpt in 30 d Advanced directive discussion - has at home, will bring [place] copy.  Wife is HCPOA. Full code but does not want prolonged life support if terminal condition. Ok with feeding tube.  Seat belt use discussed Sunscreen use discussed. No changing moles on skin. Sees derm yearly Non smoker  Alcohol -rarely  Dentist q6 mo  Eye exam yearly  Bowel - recent impaction now on miralax regimen planned daily  Bladder - chronic urge incontinence/OAB - PTNS ineffective. Wears depends daily. 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  Plays golf, remains very active with an interest in politics. Jan 2011 moved to area.  Involved in [employer].  He is a runner - has run [employer] and [employer]. S/p torn meniscus.     Relevant past medical, surgical, family and social history reviewed and updated as indicated. Interim medical history since our last visit reviewed. Allergies and medications reviewed and updated. Outpatient Medications Prior to Visit  Medication Sig Dispense Refill  . amLODipine (NORVASC) 10 MG tablet Take 1 tablet (10 mg total) by mouth daily. 90 tablet 3  . Ascorbic Acid (VITAMIN C PO) Take 500 mg by mouth 2 (two) times a day.     Marland Kitchen aspirin EC 81 MG tablet Take 1  tablet (81 mg total) by mouth daily. 90 tablet 3  . Cholecalciferol (VITAMIN D3) 25 MCG (1000 UT) CAPS Take 1 capsule by mouth daily.    Marland Kitchen docusate sodium (COLACE) 250 MG capsule Takes total 600mg  /day    . EUTHYROX 25 MCG tablet TAKE 1 TABLET BY MOUTH ONCE DAILY BEFORE BREAKFAST 30 tablet 5  . furosemide (LASIX) 20 MG tablet Take 1 tablet (20 mg total) by mouth every other day. 45 tablet 2  . MAGNESIUM PO Take 1 tablet by mouth daily.    . Multiple Vitamin (MULTIVITAMIN) tablet Take 1 tablet by mouth daily.    . Multiple Vitamins-Minerals (ZINC PO) Take by mouth daily.    . nitroGLYCERIN  (NITROSTAT) 0.4 MG SL tablet Place 1 tablet (0.4 mg total) under the tongue every 5 (five) minutes as needed for chest pain (Maximum 3 doses.). 25 tablet 2  . Omega-3 Fatty Acids (OMEGA 3 PO) Take by mouth daily.    . ondansetron (ZOFRAN-ODT) 4 MG disintegrating tablet Take 1 tablet (4 mg total) by mouth every 8 (eight) hours as needed for nausea or vomiting. 20 tablet 0  . Potassium 99 MG TABS Take 1 tablet by mouth daily.    . rosuvastatin (CRESTOR) 20 MG tablet Take 1 tablet (20 mg total) by mouth daily. 90 tablet 2  . VITAMIN A PO Take 2,400 mcg by mouth daily.     . vitamin E 400 UNIT capsule Take 400 Units by mouth daily.    . zaleplon (SONATA) 10 MG capsule Take 1 capsule (10 mg total) by mouth at bedtime as needed for sleep. 30 capsule 0  . Zinc 50 MG CAPS Take 50 mg by mouth daily.    . predniSONE (DELTASONE) 20 MG tablet 2 tabs po daily for 5 days, then 1 tab po daily for 5 days 15 tablet 0   No facility-administered medications prior to visit.     Per HPI unless specifically indicated in ROS section below Review of Systems Objective:  BP 124/68 (BP Location: Left Arm, Patient Position: Sitting, Cuff Size: Normal)   Pulse 74   Temp 98 F (36.7 C) (Temporal)   Ht 5\' 11"  (9.518 m)   Wt 199 lb (90.3 kg)   SpO2 96%   BMI 27.75 kg/m   Wt Readings from Last 3 Encounters:  04/30/20 199 lb (90.3 kg)  04/27/20 200 lb (90.7 kg)  02/04/20 203 lb 8 oz (92.3 kg)      Physical Exam Vitals and nursing note reviewed.  Constitutional:      General: He is not in acute distress.    Appearance: Normal appearance. He is well-developed. He is not ill-appearing.  HENT:     Head: Normocephalic and atraumatic.     Right Ear: Hearing, tympanic membrane, ear canal and external ear normal.     Left Ear: Hearing, tympanic membrane, ear canal and external ear normal.     Mouth/Throat:     Pharynx: Uvula midline.  Eyes:     General: No scleral icterus.    Extraocular Movements: Extraocular  movements intact.     Conjunctiva/sclera: Conjunctivae normal.     Pupils: Pupils are equal, round, and reactive to light.  Neck:     Thyroid: No thyroid mass.     Comments: Woody neck consistency Cardiovascular:     Rate and Rhythm: Normal rate and regular rhythm.     Pulses: Normal pulses.          Radial pulses  are 2+ on the right side and 2+ on the left side.     Heart sounds: Normal heart sounds. No murmur heard.   Pulmonary:     Effort: Pulmonary effort is normal. No respiratory distress.     Breath sounds: Normal breath sounds. No wheezing, rhonchi or rales.  Abdominal:     General: Abdomen is flat. Bowel sounds are normal. There is no distension.     Palpations: Abdomen is soft. There is no mass.     Tenderness: There is no abdominal tenderness. There is no guarding or rebound.     Hernia: No hernia is present.  Musculoskeletal:        General: Normal range of motion.     Right lower leg: No edema.     Left lower leg: No edema.  Lymphadenopathy:     Cervical: No cervical adenopathy.  Skin:    General: Skin is warm and dry.     Findings: No rash.  Neurological:     General: No focal deficit present.     Mental Status: He is alert and oriented to person, place, and time.     Comments: CN grossly intact, station and gait intact  Psychiatric:        Mood and Affect: Mood normal.        Behavior: Behavior normal.        Thought Content: Thought content normal.        Judgment: Judgment normal.       Results for orders placed or performed in visit on 04/28/20  Microalbumin / creatinine urine ratio  Result Value Ref Range   Microalb, Ur 3.9 (H) 0.0 - 1.9 mg/dL   Creatinine,U 176.1 mg/dL   Microalb Creat Ratio 2.2 0.0 - 30.0 mg/g  Hemoglobin A1c  Result Value Ref Range   Hgb A1c MFr Bld 6.3 4.6 - 6.5 %  CBC with Differential/Platelet  Result Value Ref Range   WBC 7.4 4.0 - 10.5 K/uL   RBC 4.77 4.22 - 5.81 Mil/uL   Hemoglobin 15.8 13.0 - 17.0 g/dL   HCT 46.4 39 -  52 %   MCV 97.2 78.0 - 100.0 fl   MCHC 34.0 30.0 - 36.0 g/dL   RDW 12.4 11.5 - 15.5 %   Platelets 216.0 150 - 400 K/uL   Neutrophils Relative % 79.3 (H) 43 - 77 %   Lymphocytes Relative 12.6 12 - 46 %   Monocytes Relative 7.8 3 - 12 %   Eosinophils Relative 0.1 0 - 5 %   Basophils Relative 0.2 0 - 3 %   Neutro Abs 5.9 1.4 - 7.7 K/uL   Lymphs Abs 0.9 0.7 - 4.0 K/uL   Monocytes Absolute 0.6 0 - 1 K/uL   Eosinophils Absolute 0.0 0 - 0 K/uL   Basophils Absolute 0.0 0 - 0 K/uL  TSH  Result Value Ref Range   TSH 3.27 0.35 - 4.50 uIU/mL  Comprehensive metabolic panel  Result Value Ref Range   Sodium 141 135 - 145 mEq/L   Potassium 4.5 3.5 - 5.1 mEq/L   Chloride 103 96 - 112 mEq/L   CO2 30 19 - 32 mEq/L   Glucose, Bld 123 (H) 70 - 99 mg/dL   BUN 14 6 - 23 mg/dL   Creatinine, Ser 0.75 0.40 - 1.50 mg/dL   Total Bilirubin 1.1 0.2 - 1.2 mg/dL   Alkaline Phosphatase 60 39 - 117 U/L   AST 20 0 - 37 U/L  ALT 24 0 - 53 U/L   Total Protein 6.2 6.0 - 8.3 g/dL   Albumin 3.8 3.5 - 5.2 g/dL   GFR 100.05 >60.00 mL/min   Calcium 9.3 8.4 - 10.5 mg/dL  Lipid panel  Result Value Ref Range   Cholesterol 96 0 - 200 mg/dL   Triglycerides 74.0 0 - 149 mg/dL   HDL 36.20 (L) >39.00 mg/dL   VLDL 14.8 0.0 - 40.0 mg/dL   LDL Cholesterol 45 0 - 99 mg/dL   Total CHOL/HDL Ratio 3    NonHDL 59.94    Assessment & Plan:  This visit occurred during the SARS-CoV-2 public health emergency.  Safety protocols were in place, including screening questions prior to the visit, additional usage of staff PPE, and extensive cleaning of exam room while observing appropriate contact time as indicated for disinfecting solutions.   Problem List Items Addressed This Visit    Thrombocytopenia ([place])    plt count elevated - possibly related to recent COVID illness - will reassess after full recovery       Prediabetes    Chronic, stable off medication - continues glucerna supplement regularly.  Diet limitations.        Ischemic cardiomyopathy   Hypothyroidism, acquired    Stable period on low dose levothyroxine 67mcg daily - continue.       Hyperlipidemia LDL goal <70    Chronic, stable on crestor - continue.  The ASCVD Risk score [name]., et al., 2013) failed to calculate for the following reasons:   The 2013 ASCVD risk score is only valid for ages 30 to 69       History of cancer tonsil    S/p rad and chemo (2011) with persistent dysphagia       Essential hypertension    Chronic, stable. Continue current regimen.       Detrusor instability of bladder    Saw urology Louis Meckel -> [place]) failed oral meds and PNTS.       Coronary artery disease involving native coronary artery of native heart without angina pectoris    Regularly sees cardiology - continue aspiring and crestor.       Chronic systolic heart failure (HCC)    Continues lasix 20mg  QOD with OTC potassium replacement.       Chronic constipation    S/p recent fecal impaction s/p ER treatment. Planned continued miralax daily.       Relevant Medications   polyethylene glycol powder (GLYCOLAX/MIRALAX) 17 GM/SCOOP powder   BPH associated with nocturia    Saw urology - now off medications.       Bilateral carotid artery stenosis    Latest carotid US 04/2019 with mild stenosis - consider updating next year      Advanced care planning/counseling discussion - Primary    Advanced directive discussion - has at home, will bring [place] copy. Wife is HCPOA. Full code but does not want prolonged life support if terminal condition. Ok with feeding tube.        Other Visit Diagnoses    Need for influenza vaccination       Relevant Orders   Flu Vaccine QUAD High Dose(Fluad) (Completed)       Meds ordered this encounter  Medications  . polyethylene glycol powder (GLYCOLAX/MIRALAX) 17 GM/SCOOP powder    Sig: Take 17 g by mouth daily.   Orders Placed This Encounter  Procedures  . Flu Vaccine QUAD High Dose(Fluad)    Patient  instructions: Flu shot  today  You are due for second shingles shot - but wait 30 days from today to get it- check with pharmacy where you got the first dose.  Bring me copy of your advanced directives to update your chart.  Continue daily miralax for goal 1 soft stool a day.  You are doing well today. I'm glad you've recovered from [place]! Return as needed or in 6 months for follow up visit.   Follow up plan: Return in about 6 months (around 10/28/2020) for follow up visit.  [name], MD

## 2020-04-30 NOTE — Patient Instructions (Addendum)
Flu shot today  You are due for second shingles shot - but wait 30 days from today to get it- check with pharmacy where you got the first dose.  Bring me copy of your advanced directives to update your chart.  Continue daily miralax for goal 1 soft stool a day.  You are doing well today. I'm glad you've recovered from [place]! Return as needed or in 6 months for follow up visit.   Health Maintenance After Age 80 After age 63, you are at a higher risk for certain long-term diseases and infections as well as injuries from falls. Falls are a major cause of broken bones and head injuries in people who are older than age 40. Getting regular preventive care can help to keep you healthy and well. Preventive care includes getting regular testing and making lifestyle changes as recommended by your health care provider. Talk with your health care provider about:  Which screenings and tests you should have. A screening is a test that checks for a disease when you have no symptoms.  A diet and exercise plan that is right for you. What should I know about screenings and tests to prevent falls? Screening and testing are the best ways to find a health problem early. Early diagnosis and treatment give you the best chance of managing medical conditions that are common after age 2. Certain conditions and lifestyle choices may make you more likely to have a fall. Your health care provider may recommend:  Regular vision checks. Poor vision and conditions such as cataracts can make you more likely to have a fall. If you wear glasses, make sure to get your prescription updated if your vision changes.  Medicine review. Work with your health care provider to regularly review all of the medicines you are taking, including over-the-counter medicines. Ask your health care provider about any side effects that may make you more likely to have a fall. Tell your health care provider if any medicines that you take make you feel  dizzy or sleepy.  Osteoporosis screening. Osteoporosis is a condition that causes the bones to get weaker. This can make the bones weak and cause them to break more easily.  Blood pressure screening. Blood pressure changes and medicines to control blood pressure can make you feel dizzy.  Strength and balance checks. Your health care provider may recommend certain tests to check your strength and balance while standing, walking, or changing positions.  Foot health exam. Foot pain and numbness, as well as not wearing proper footwear, can make you more likely to have a fall.  Depression screening. You may be more likely to have a fall if you have a fear of falling, feel emotionally low, or feel unable to do activities that you used to do.  Alcohol use screening. Using too much alcohol can affect your balance and may make you more likely to have a fall. What actions can I take to lower my risk of falls? General instructions  Talk with your health care provider about your risks for falling. Tell your health care provider if: ? You fall. Be sure to tell your health care provider about all falls, even ones that seem minor. ? You feel dizzy, sleepy, or off-balance.  Take over-the-counter and prescription medicines only as told by your health care provider. These include any supplements.  Eat a healthy diet and maintain a healthy weight. A healthy diet includes low-fat dairy products, low-fat (lean) meats, and fiber from whole grains, beans,  and lots of fruits and vegetables. Home safety  Remove any tripping hazards, such as rugs, cords, and clutter.  Install safety equipment such as grab bars in bathrooms and safety rails on stairs.  Keep rooms and walkways well-lit. Activity   Follow a regular exercise program to stay fit. This will help you maintain your balance. Ask your health care provider what types of exercise are appropriate for you.  If you need a cane or walker, use it as  recommended by your health care provider.  Wear supportive shoes that have nonskid soles. Lifestyle  Do not drink alcohol if your health care provider tells you not to drink.  If you drink alcohol, limit how much you have: ? 0-1 drink a day for women. ? 0-2 drinks a day for men.  Be aware of how much alcohol is in your drink. In the [place] one drink equals one typical bottle of beer (12 oz), one-half glass of wine (5 oz), or one shot of hard liquor (1 oz).  Do not use any products that contain nicotine or tobacco, such as cigarettes and e-cigarettes. If you need help quitting, ask your health care provider. Summary  Having a healthy lifestyle and getting preventive care can help to protect your health and wellness after age 77.  Screening and testing are the best way to find a health problem early and help you avoid having a fall. Early diagnosis and treatment give you the best chance for managing medical conditions that are more common for people who are older than age 19.  Falls are a major cause of broken bones and head injuries in people who are older than age 42. Take precautions to prevent a fall at home.  Work with your health care provider to learn what changes you can make to improve your health and wellness and to prevent falls. This information is not intended to replace advice given to you by your health care provider. Make sure you discuss any questions you have with your health care provider. Document Revised: 11/28/2018 Document Reviewed: 06/20/2017 Elsevier Patient Education  2020 [employer].

## 2020-05-01 MED ORDER — POLYETHYLENE GLYCOL 3350 17 GM/SCOOP PO POWD: 17.0000 g | Freq: Every day | ORAL | Status: DC

## 2020-05-01 NOTE — Assessment & Plan Note (Addendum)
Chronic, stable on crestor - continue.  The ASCVD Risk score [name]., et al., 2013) failed to calculate for the following reasons:   The 2013 ASCVD risk score is only valid for ages 61 to 29

## 2020-05-01 NOTE — Assessment & Plan Note (Addendum)
Latest carotid US 04/2019 with mild stenosis - consider updating next year

## 2020-05-01 NOTE — Assessment & Plan Note (Signed)
Advanced directive discussion - has at home, will bring [place] copy. Wife is HCPOA. Full code but does not want prolonged life support if terminal condition. Ok with feeding tube.

## 2020-05-01 NOTE — Assessment & Plan Note (Addendum)
Regularly sees cardiology - continue aspiring and crestor.

## 2020-05-01 NOTE — Assessment & Plan Note (Signed)
Chronic, stable. Continue current regimen. 

## 2020-05-01 NOTE — Assessment & Plan Note (Signed)
S/p rad and chemo (2011) with persistent dysphagia

## 2020-05-01 NOTE — Assessment & Plan Note (Addendum)
Chronic, stable off medication - continues glucerna supplement regularly.  Diet limitations.

## 2020-05-01 NOTE — Assessment & Plan Note (Signed)
Stable period on low dose levothyroxine 45mcg daily - continue.

## 2020-05-01 NOTE — Assessment & Plan Note (Addendum)
Continues lasix 20mg  QOD with OTC potassium replacement.

## 2020-05-01 NOTE — Assessment & Plan Note (Addendum)
Saw urology Louis Meckel -> [place]) failed oral meds and PNTS.

## 2020-05-01 NOTE — Assessment & Plan Note (Signed)
Saw urology - now off medications.

## 2020-05-01 NOTE — Assessment & Plan Note (Signed)
S/p recent fecal impaction s/p ER treatment. Planned continued miralax daily.

## 2020-05-01 NOTE — Assessment & Plan Note (Signed)
plt count elevated - possibly related to recent COVID illness - will reassess after full recovery

## 2020-05-18 ENCOUNTER — Telehealth: Payer: Self-pay | Admitting: Family Medicine

## 2020-05-18 NOTE — Telephone Encounter (Signed)
Patient dropped off form from [place]. Please complete form and fax to [employer].  Form in rx tower.

## 2020-05-19 NOTE — Telephone Encounter (Signed)
Filled and in Lisa's box 

## 2020-05-19 NOTE — Telephone Encounter (Signed)
Placed form in Dr. G's box.  

## 2020-05-19 NOTE — Telephone Encounter (Addendum)
Faxed form.  Made copy to scan and mailed original to pt.   Attempted to notify pt.  No answer.  No vm.

## 2020-06-04 ENCOUNTER — Other Ambulatory Visit: Payer: Self-pay | Admitting: Family Medicine

## 2020-06-09 ENCOUNTER — Other Ambulatory Visit: Payer: Self-pay | Admitting: Internal Medicine

## 2020-06-30 ENCOUNTER — Ambulatory Visit: Payer: PPO | Admitting: Internal Medicine

## 2020-06-30 ENCOUNTER — Other Ambulatory Visit: Payer: Self-pay

## 2020-06-30 ENCOUNTER — Encounter: Payer: Self-pay | Admitting: Internal Medicine

## 2020-06-30 VITALS — BP 150/80 | HR 62 | Ht 71.0 in | Wt 204.0 lb

## 2020-06-30 DIAGNOSIS — I10 Essential (primary) hypertension: Secondary | ICD-10-CM

## 2020-06-30 DIAGNOSIS — I2510 Atherosclerotic heart disease of native coronary artery without angina pectoris: Secondary | ICD-10-CM

## 2020-06-30 DIAGNOSIS — I255 Ischemic cardiomyopathy: Secondary | ICD-10-CM | POA: Diagnosis not present

## 2020-06-30 DIAGNOSIS — R2689 Other abnormalities of gait and mobility: Secondary | ICD-10-CM

## 2020-06-30 MED ORDER — AMLODIPINE BESYLATE 10 MG PO TABS: 10.0000 mg | ORAL_TABLET | Freq: Every day | ORAL | 3 refills | Status: DC

## 2020-06-30 MED ORDER — ROSUVASTATIN CALCIUM 20 MG PO TABS
20.0000 mg | ORAL_TABLET | Freq: Every day | ORAL | 2 refills | Status: DC
Start: 2020-06-30 — End: 2020-10-22

## 2020-06-30 MED ORDER — FUROSEMIDE 20 MG PO TABS: 20.0000 mg | ORAL_TABLET | ORAL | 2 refills | Status: DC

## 2020-06-30 NOTE — Progress Notes (Signed)
Follow-up Outpatient Visit Date: 06/30/2020  Primary Care Provider: [name], MD [place] [address]  Chief Complaint: Balance problems  HPI:  Phillip [name] is a 79 y.o. male with history of coronary artery disease status post CABG in 03/3661, chronic systolic heart failure secondary to ischemic cardiomyopathy, post-operative atrial flutter, hypertension,andhyperlipidemia, who presents for follow-up of coronary artery disease and ischemic cardiomyopathy.  I last saw him in May, at which time he was doing well other than right lateral thigh pain for which he had been doing physical therapy.  We did not make any medication changes or pursue additional testing at that time.  Today, Phillip [name] notes that he has felt off balance at times.  This first developed 3 months ago while he was playing golf.  Around the 14th hole, he started to feel weak and off balance and was unable to finish his round of golf.  He subsequently developed a low-grade fever and was ultimately diagnosed with COVID-19.  He was treated with monoclonal antibody therapy and recovered from this well.  He began hiking again 3 weeks ago and again started to feel somewhat off balance towards the Phillip [name] of the walk.  He has not fallen or passed out but feels like he would benefit from a cane or a walker at times.  He has also noticed that his blood pressure is a bit low when he exercises.  When he has not been exercising, his blood pressure is typically normal or even a little bit high, as it is also today in the office.  He denies chest pain, shortness of breath, palpitations, and edema.  He continues to bruise easily.  --------------------------------------------------------------------------------------------------  Past Medical History:  Diagnosis Date  . Anxiety   . Atrial flutter ([place]) 01/07/2018   Post-op after CABG  . Basal cell carcinoma 09/12/2018   Left forehead above lateral brow. Nodular  pattern  . Cancer ([place])   . Coronary artery disease 2003   a. nonobstructive CAD by cath in 2003 and 2005; b.  Status post three-vessel CABG on 12/12/2017 with LIMA to LAD, sequential reverse SVG to OM1 and distal LCx  . COVID-19 03/2020  . Diverticulosis   . Hearing loss    bilateral  . Heart disease   . Heart murmur   . History of benign prostatic hypertrophy   . History of radiation therapy 01/31/10-03/22/10   right tonsil/right neck node 7000 cGy 35 sessions, high risk lymph node volume 5940 cGy 35 sessions, low risk lymph node vol 5600 cGy 35 sessions  . 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 HTN (hypertension)   . Hyperlipidemia    diet controlled- taking medication d/t ensure drinking  . Hypothyroid 01/16/2013  . Ischemic cardiomyopathy    a. 10/2017: echo showing reduced EF of 40-45%, HK of the anterior, anteroseptal and apical myocardium with Grade 1 DD.   Marland Kitchen Neck pain   . Squamous cell carcinoma    right tonsil- 2011  . Squamous cell carcinoma of skin 09/02/2019   Crown scalp. WD SCC  . Squamous cell carcinoma of skin 09/02/2019   Left distal lateral deltoid. SCCis arising in SK  . Thrombocytopenia ([place]) 02/02/2012   unclear etiology  . Tinnitus   . Tubular adenoma of colon    Past Surgical History:  Procedure Laterality Date  . artoscopic knee    . CARDIAC CATHETERIZATION  2003, 2005   40% blockage, two 30% blockages treated medically Ron Parker)  .  CARDIOVERSION N/A 02/12/2018   Procedure: CARDIOVERSION;  Surgeon: [name], MD;  Location: [facility];  Service: Cardiovascular;  Laterality: N/A;  . COLONOSCOPY  03/2015   TA x3, HP, melanosis coli, severe diverticulosis, rpt 3 yrs ([place])  . COLONOSCOPY  01/2019   fair prep, diverticulosis, f/u PRN ([place])  . CORONARY ARTERY BYPASS GRAFT N/A 12/12/2017   Procedure: CORONARY ARTERY BYPASS GRAFTING (CABG) x3 using the right greater saphenous vein harvested endoscopically and the left internal mammary artery. LIMA to LAD, SEQ SVG to  OM1 & OM2;  Surgeon: [name], MD;  Location: [facility];  Service: Open Heart Surgery;  Laterality: N/A;  . DENTAL SURGERY     extractions  . IR THORACENTESIS ASP PLEURAL SPACE W/IMG GUIDE  01/17/2018  . KNEE ARTHROSCOPY Left   . LEFT HEART CATH AND CORONARY ANGIOGRAPHY N/A 12/10/2017   Procedure: LEFT HEART CATH AND CORONARY ANGIOGRAPHY;  Surgeon: [name], MD;  Location: [facility];  Service: Cardiovascular;  Laterality: N/A;  . PEG PLACEMENT  02/2010  . TEE WITHOUT CARDIOVERSION N/A 12/12/2017   Procedure: TRANSESOPHAGEAL ECHOCARDIOGRAM (TEE);  Surgeon: [name], MD;  Location: [facility];  Service: Open Heart Surgery;  Laterality: N/A;  . TONSILLECTOMY    . triple bypass  11/2017  . VASECTOMY       Current Meds  Medication Sig  . amLODipine (NORVASC) 10 MG tablet Take 1 tablet (10 mg total) by mouth daily.  . Ascorbic Acid (VITAMIN C PO) Take 500 mg by mouth 2 (two) times a day.   Marland Kitchen aspirin EC 81 MG tablet Take 1 tablet (81 mg total) by mouth daily.  . Cholecalciferol (VITAMIN D3) 25 MCG (1000 UT) CAPS Take 1 capsule by mouth daily.  Marland Kitchen docusate sodium (COLACE) 250 MG capsule Takes total 600mg  /day  . EUTHYROX 25 MCG tablet TAKE 1 TABLET BY MOUTH ONCE DAILY BEFORE BREAKFAST  . furosemide (LASIX) 20 MG tablet Take 1 tablet (20 mg total) by mouth every other day.  Marland Kitchen MAGNESIUM PO Take 1 tablet by mouth daily.  . Multiple Vitamin (MULTIVITAMIN) tablet Take 1 tablet by mouth daily.  . Multiple Vitamins-Minerals (ZINC PO) Take by mouth daily.  . nitroGLYCERIN (NITROSTAT) 0.4 MG SL tablet Place 1 tablet (0.4 mg total) under the tongue every 5 (five) minutes as needed for chest pain (Maximum 3 doses.).  Marland Kitchen Omega-3 Fatty Acids (OMEGA 3 PO) Take by mouth daily.  . ondansetron (ZOFRAN-ODT) 4 MG disintegrating tablet Take 1 tablet (4 mg total) by mouth every 8 (eight) hours as needed for nausea or vomiting.  . polyethylene glycol powder (GLYCOLAX/MIRALAX) 17 GM/SCOOP  powder Take 17 g by mouth daily.  . Potassium 99 MG TABS Take 1 tablet by mouth daily.  . rosuvastatin (CRESTOR) 20 MG tablet Take 1 tablet (20 mg total) by mouth daily.  Marland Kitchen VITAMIN A PO Take 2,400 mcg by mouth daily.   . vitamin E 400 UNIT capsule Take 400 Units by mouth daily.  . zaleplon (SONATA) 10 MG capsule Take 1 capsule (10 mg total) by mouth at bedtime as needed for sleep.  Marland Kitchen Zinc 50 MG CAPS Take 50 mg by mouth daily.  . [DISCONTINUED] amLODipine (NORVASC) 10 MG tablet Take 1 tablet (10 mg total) by mouth daily.  . [DISCONTINUED] furosemide (LASIX) 20 MG tablet TAKE 1 TABLET BY MOUTH EVERY OTHER DAY  . [DISCONTINUED] rosuvastatin (CRESTOR) 20 MG tablet Take 1 tablet (20 mg total) by mouth daily.  Allergies: Ace inhibitors and Morphine and related  Social History   Tobacco Use  . Smoking status: Never Smoker  . Smokeless tobacco: Never Used  Vaping Use  . Vaping Use: Never used  Substance Use Topics  . Alcohol use: Yes    Comment: wine 1-2 times a month   . Drug use: No    Family History  Problem Relation Age of Onset  . Coronary artery disease Father   . Heart attack Father 65  . Heart disease Father   . Stroke Mother   . Colon cancer Neg Hx   . Esophageal cancer Neg Hx   . Rectal cancer Neg Hx   . Stomach cancer Neg Hx   . Kidney cancer Neg Hx   . Kidney failure Neg Hx   . Prostate cancer Neg Hx   . Tuberculosis Neg Hx     Review of Systems: A 12-system review of systems was performed and was negative except as noted in the HPI.  --------------------------------------------------------------------------------------------------  Physical Exam: BP (!) 150/80 (BP Location: Left Arm, Patient Position: Sitting, Cuff Size: Normal)   Pulse 62   Ht 5\' 11"  (1.803 m)   Wt 204 lb (92.5 kg)   SpO2 96%   BMI 28.45 kg/m   General: NAD. HEENT: No conjunctival pallor or scleral icterus. Facemask in place. Neck: Supple without lymphadenopathy, thyromegaly, JVD, or  HJR. Lungs: Normal work of breathing. Clear to auscultation bilaterally without wheezes or crackles. Heart: Regular rate and rhythm without murmurs, rubs, or gallops. Non-displaced PMI. Abd: Bowel sounds present. Soft, NT/ND without hepatosplenomegaly Ext: Trace pretibial edema.  2+ radial pulses bilaterally. Skin: Warm and dry without rash.  EKG: Normal sinus rhythm with axis deviation, first-degree AV block (PR interval 212 ms), LVH, and IVCD.  PVCs are no longer present.  PR interval has also increased slightly.  Otherwise, no significant change since 12/24/2019.  Lab Results  Component Value Date   WBC 7.4 04/28/2020   HGB 15.8 04/28/2020   HCT 46.4 04/28/2020   MCV 97.2 04/28/2020   PLT 216.0 04/28/2020    Lab Results  Component Value Date   NA 141 04/28/2020   K 4.5 04/28/2020   CL 103 04/28/2020   CO2 30 04/28/2020   BUN 14 04/28/2020   CREATININE 0.75 04/28/2020   GLUCOSE 123 (H) 04/28/2020   ALT 24 04/28/2020    Lab Results  Component Value Date   CHOL 96 04/28/2020   HDL 36.20 (L) 04/28/2020   LDLCALC 45 04/28/2020   LDLDIRECT 101.0 10/13/2014   TRIG 74.0 04/28/2020   CHOLHDL 3 04/28/2020    --------------------------------------------------------------------------------------------------  ASSESSMENT AND PLAN: Balance problems: This began in August at the time that Phillip [name] was diagnosed with COVID-19.  Despite recovering from COVID-19, he continues to have some balance problems, especially when he has been walking for a while.  His examination today is unremarkable.  He has stable EKG findings with first-degree AV block and nonspecific intraventricular conduction delay.  I have recommended that we obtain a transthoracic echocardiogram to exclude new structural abnormalities that could be contributing to this, particularly in light of COVID-19 infection a few months ago.  If echocardiogram is unrevealing, it may be worthwhile for Phillip [name] to speak with Dr.  Danise Mina about further neurologic evaluation.  Coronary artery disease: Phillip [name] denies chest pain and shortness of breath to suggest worsening coronary insufficiency.  His EKG has stable findings outlined above.  We will continue current medications for secondary  prevention.  Ischemic cardiomyopathy: Other than trace pedal edema, Phillip [name] appears euvolemic with NYHA class I-II symptoms.  We will continue with every other day furosemide.  I will continue to defer ACE inhibitor/ARB is given history of possible angioedema in the past.  Beta-blockers are also on hold given history of bradycardia and first-degree AV block.  Hypertension: Blood pressure mildly elevated today but typically better at home and often times low after exercise.  In this setting, we will defer escalation of blood pressure therapy.  If leg edema worsens, we may need to consider de-escalation of amlodipine in favor of an alternative agent.  Hyperlipidemia: LDL well controlled.  Continue rosuvastatin.  Follow-up: Return to clinic in 3 months.  [name], MD 07/02/2020 6:22 AM

## 2020-06-30 NOTE — Patient Instructions (Signed)
Medication Instructions:  Your physician recommends that you continue on your current medications as directed. Please refer to the Current Medication list given to you today. *If you need a refill on your cardiac medications before your next appointment, please call your pharmacy*  Lab Work: none If you have labs (blood work) drawn today and your tests are completely normal, you will receive your results only by: Marland Kitchen MyChart Message (if you have MyChart) OR . A paper copy in the mail If you have any lab test that is abnormal or we need to change your treatment, we will call you to review the results.  Testing/Procedures: Your physician has requested that you have an echocardiogram. Echocardiography is a painless test that uses sound waves to create images of your heart. It provides your doctor with information about the size and shape of your heart and how well your heart's chambers and valves are working. This procedure takes approximately one hour. There are no restrictions for this procedure. There is a possibility that an IV may need to be started during your test to inject an image enhancing agent. This is done to obtain more optimal pictures of your heart. Therefore we ask that you do at least drink some water prior to coming in to hydrate your veins.   Follow-Up: At [facility], you and your health needs are our priority.  As part of our continuing mission to provide you with exceptional heart care, we have created designated Provider Care Teams.  These Care Teams include your primary Cardiologist (physician) and Advanced Practice Providers (APPs -  Physician Assistants and Nurse Practitioners) who all work together to provide you with the care you need, when you need it.  We recommend signing up for the patient portal called "MyChart".  Sign up information is provided on this After Visit Summary.  MyChart is used to connect with patients for Virtual Visits (Telemedicine).  Patients are  able to view lab/test results, encounter notes, upcoming appointments, etc.  Non-urgent messages can be sent to your provider as well.   To learn more about what you can do with MyChart, go to [email or web address].    Your next appointment:   3 month(s)  The format for your next appointment:   In Person  Provider:   You may see [name], MD or one of the following Advanced Practice Providers on your designated Care Team:    Murray Hodgkins, NP  Christell Faith, PA-C  Marrianne Mood, PA-C  Cadence Foxworth, [place]

## 2020-07-02 ENCOUNTER — Encounter: Payer: Self-pay | Admitting: Internal Medicine

## 2020-07-27 ENCOUNTER — Ambulatory Visit (INDEPENDENT_AMBULATORY_CARE_PROVIDER_SITE_OTHER): Payer: PPO

## 2020-07-27 ENCOUNTER — Other Ambulatory Visit: Payer: Self-pay

## 2020-07-27 DIAGNOSIS — R2689 Other abnormalities of gait and mobility: Secondary | ICD-10-CM | POA: Diagnosis not present

## 2020-07-27 DIAGNOSIS — I255 Ischemic cardiomyopathy: Secondary | ICD-10-CM | POA: Diagnosis not present

## 2020-07-27 LAB — ECHOCARDIOGRAM COMPLETE
AR max vel: 2.37 cm2
AV Area VTI: 3.01 cm2
AV Area mean vel: 2.65 cm2
AV Mean grad: 6 mmHg
AV Peak grad: 10.8 mmHg
Ao pk vel: 1.64 m/s
Area-P 1/2: 1.74 cm2
Calc EF: 51.5 %
S' Lateral: 4.3 cm
Single Plane A2C EF: 57.6 %
Single Plane A4C EF: 44.7 %

## 2020-07-28 ENCOUNTER — Telehealth: Payer: Self-pay | Admitting: *Deleted

## 2020-07-28 NOTE — Telephone Encounter (Signed)
No answer. No voicemail. Phone note started.  Results released to My Chart.

## 2020-07-28 NOTE — Telephone Encounter (Signed)
-----   Message from [name], MD sent at 07/28/2020 11:24 AM EST ----- Please let Phillip [name] know that his echocardiogram shows stable findings of mildly reduced left ventricular contraction (LVEF 45-50%, same as in 03/2018).  There are no new findings to explain his recent balance problems.  If they continue to be an issue, I recommend that he speak with Dr. Danise Mina about further evaluation.  Phillip [name] should continue his current medications and follow-up as previously discussed

## 2020-07-30 NOTE — Telephone Encounter (Signed)
No answer after several rings and then there was a beep that sounded like a VM picking up. Left message to call back.

## 2020-08-09 ENCOUNTER — Encounter: Payer: Self-pay | Admitting: *Deleted

## 2020-08-09 NOTE — Telephone Encounter (Signed)
No answer after several rings. Then a beep sound which sounded like it could be an answering machine. Left message asking him to call back.  This was 3rd attempt. Letter with results mailed to patient.

## 2020-08-18 DIAGNOSIS — H2513 Age-related nuclear cataract, bilateral: Secondary | ICD-10-CM | POA: Diagnosis not present

## 2020-08-25 ENCOUNTER — Ambulatory Visit (INDEPENDENT_AMBULATORY_CARE_PROVIDER_SITE_OTHER): Payer: PPO | Admitting: Otolaryngology

## 2020-08-25 ENCOUNTER — Other Ambulatory Visit: Payer: Self-pay

## 2020-08-25 ENCOUNTER — Other Ambulatory Visit (INDEPENDENT_AMBULATORY_CARE_PROVIDER_SITE_OTHER): Payer: Self-pay

## 2020-08-25 ENCOUNTER — Other Ambulatory Visit (HOSPITAL_COMMUNITY)
Admission: RE | Admit: 2020-08-25 | Discharge: 2020-08-25 | Disposition: A | Discharge status: Home / Self Care | Payer: PPO | Source: Ambulatory Visit | Attending: Otolaryngology | Admitting: Otolaryngology

## 2020-08-25 ENCOUNTER — Encounter (INDEPENDENT_AMBULATORY_CARE_PROVIDER_SITE_OTHER): Payer: Self-pay | Admitting: Otolaryngology

## 2020-08-25 VITALS — Temp 97.2°F

## 2020-08-25 DIAGNOSIS — D3705 Neoplasm of uncertain behavior of pharynx: Secondary | ICD-10-CM | POA: Insufficient documentation

## 2020-08-25 DIAGNOSIS — H6123 Impacted cerumen, bilateral: Secondary | ICD-10-CM

## 2020-08-25 DIAGNOSIS — K1370 Unspecified lesions of oral mucosa: Secondary | ICD-10-CM | POA: Diagnosis not present

## 2020-08-25 DIAGNOSIS — Z85819 Personal history of malignant neoplasm of unspecified site of lip, oral cavity, and pharynx: Secondary | ICD-10-CM | POA: Diagnosis not present

## 2020-08-25 DIAGNOSIS — D3709 Neoplasm of uncertain behavior of other specified sites of the oral cavity: Secondary | ICD-10-CM

## 2020-08-25 DIAGNOSIS — D3701 Neoplasm of uncertain behavior of lip: Secondary | ICD-10-CM | POA: Insufficient documentation

## 2020-08-25 DIAGNOSIS — H903 Sensorineural hearing loss, bilateral: Secondary | ICD-10-CM | POA: Diagnosis not present

## 2020-08-25 DIAGNOSIS — K1379 Other lesions of oral mucosa: Secondary | ICD-10-CM | POA: Diagnosis not present

## 2020-08-25 NOTE — Progress Notes (Signed)
HPI: Phillip [name] is a 80 y.o. male who returns today for evaluation of left buccal mucosal lesion.  He has a previous history of a T1 and 1 right tonsil cancer treated in 2011 with chemoradiation treatment.  He has done well with this.  He has had chronic problems with bilateral buccal mucosa inflammatory changes and has had several biopsies in the past that have been negative showing just chronic inflammation.  On last visit 6 months ago I was concerned about the left buccal mucosal up toward the parotid area and recommended follow-up here for recheck.  He also has hearing loss and wears bilateral hearing aids.  He has also complained of increasing balance problems more recently..  Past Medical History:  Diagnosis Date  . Anxiety   . Atrial flutter ([place]) 01/07/2018   Post-op after CABG  . Basal cell carcinoma 09/12/2018   Left forehead above lateral brow. Nodular pattern  . Cancer ([place])   . Coronary artery disease 2003   a. nonobstructive CAD by cath in 2003 and 2005; b.  Status post three-vessel CABG on 12/12/2017 with LIMA to LAD, sequential reverse SVG to OM1 and distal LCx  . COVID-19 03/2020  . Diverticulosis   . Hearing loss    bilateral  . Heart disease   . Heart murmur   . History of benign prostatic hypertrophy   . History of radiation therapy 01/31/10-03/22/10   right tonsil/right neck node 7000 cGy 35 sessions, high risk lymph node volume 5940 cGy 35 sessions, low risk lymph node vol 5600 cGy 35 sessions  . 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 HTN (hypertension)   . Hyperlipidemia    diet controlled- taking medication d/t ensure drinking  . Hypothyroid 01/16/2013  . Ischemic cardiomyopathy    a. 10/2017: echo showing reduced EF of 40-45%, HK of the anterior, anteroseptal and apical myocardium with Grade 1 DD.   Marland Kitchen Neck pain   . Squamous cell carcinoma    right tonsil- 2011  . Squamous cell carcinoma of skin 09/02/2019   Crown scalp. WD SCC  . Squamous cell carcinoma of skin 09/02/2019    Left distal lateral deltoid. SCCis arising in SK  . Thrombocytopenia ([place]) 02/02/2012   unclear etiology  . Tinnitus   . Tubular adenoma of colon    Past Surgical History:  Procedure Laterality Date  . artoscopic knee    . CARDIAC CATHETERIZATION  2003, 2005   40% blockage, two 30% blockages treated medically Ron Parker)  . CARDIOVERSION N/A 02/12/2018   Procedure: CARDIOVERSION;  Surgeon: [name], MD;  Location: [facility]S;  Service: Cardiovascular;  Laterality: N/A;  . COLONOSCOPY  03/2015   TA x3, HP, melanosis coli, severe diverticulosis, rpt 3 yrs ([place])  . COLONOSCOPY  01/2019   fair prep, diverticulosis, f/u PRN ([place])  . CORONARY ARTERY BYPASS GRAFT N/A 12/12/2017   Procedure: CORONARY ARTERY BYPASS GRAFTING (CABG) x3 using the right greater saphenous vein harvested endoscopically and the left internal mammary artery. LIMA to LAD, SEQ SVG to OM1 & OM2;  Surgeon: [name], MD;  Location: [place];  Service: Open Heart Surgery;  Laterality: N/A;  . DENTAL SURGERY     extractions  . IR THORACENTESIS ASP PLEURAL SPACE W/IMG GUIDE  01/17/2018  . KNEE ARTHROSCOPY Left   . LEFT HEART CATH AND CORONARY ANGIOGRAPHY N/A 12/10/2017   Procedure: LEFT HEART CATH AND CORONARY ANGIOGRAPHY;  Surgeon: [name], MD;  Location: [place] [facility];  Service:  Cardiovascular;  Laterality: N/A;  . PEG PLACEMENT  02/2010  . TEE WITHOUT CARDIOVERSION N/A 12/12/2017   Procedure: TRANSESOPHAGEAL ECHOCARDIOGRAM (TEE);  Surgeon: Delight Ovens, MD;  Location: [facility];  Service: Open Heart Surgery;  Laterality: N/A;  . TONSILLECTOMY    . triple bypass  11/2017  . VASECTOMY     Social History   Socioeconomic History  . Marital status: Married    Spouse name: Dennie Bible  . Number of children: 1  . Years of education: Not on file  . Highest education level: Not on file  Occupational History  . Not on file  Tobacco Use  . Smoking status: Never Smoker  . Smokeless tobacco: Never Used   Vaping Use  . Vaping Use: Never used  Substance and Sexual Activity  . Alcohol use: Yes    Comment: wine 1-2 times a month   . Drug use: No  . Sexual activity: Yes  Other Topics Concern  . 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    Plays golf, remains very active with an interest in politics. Jan 2011 moved to area.    Involved in [employer].    He is a runner - has run [employer] and [employer]. S/p torn meniscus.    Social Determinants of Health   Financial Resource Strain: Low Risk   . Difficulty of Paying Living Expenses: Not hard at all  Food Insecurity: No Food Insecurity  . Worried About Programme researcher, broadcasting/film/video in the Last Year: Never true  . Ran Out of Food in the Last Year: Never true  Transportation Needs: No Transportation Needs  . Lack of Transportation (Medical): No  . Lack of Transportation (Non-Medical): No  Physical Activity: Sufficiently Active  . Days of Exercise per Week: 7 days  . Minutes of Exercise per Session: 30 min  Stress: No Stress Concern Present  . Feeling of Stress : Not at all  Social Connections: Not on file   Family History  Problem Relation Age of Onset  . Coronary artery disease Father   . Heart attack Father 42  . Heart disease Father   . Stroke Mother   . Colon cancer Neg Hx   . Esophageal cancer Neg Hx   . Rectal cancer Neg Hx   . Stomach cancer Neg Hx   . Kidney cancer Neg Hx   . Kidney failure Neg Hx   . Prostate cancer Neg Hx   . Tuberculosis Neg Hx    Allergies  Allergen Reactions  . Ace Inhibitors Swelling and Other (See Comments)    Possible angioedema (upper lip swelling)  . Morphine And Related Swelling    SWELLING REACTION UNSPECIFIED  [severity rated per PMH, 12/11/2017]   Prior to Admission medications   Medication  Sig Start Date End Date Taking? Authorizing Provider  amLODipine (NORVASC) 10 MG tablet Take 1 tablet (10 mg total) by mouth daily. 06/30/20 09/28/20  End, Cristal Deer, MD  Ascorbic Acid (VITAMIN C PO) Take 500 mg by mouth 2 (two) times a day.     [provider]  aspirin EC 81 MG tablet Take 1 tablet (81 mg total) by mouth daily. 01/17/18   End, Cristal Deer, MD  Cholecalciferol (VITAMIN D3) 25 MCG (1000 UT) CAPS Take 1 capsule by mouth daily.  [provider]  docusate sodium (COLACE) 250 MG capsule Takes total 600mg  /day 04/25/19   06/25/19, MD  EUTHYROX 25 MCG tablet TAKE 1 TABLET BY MOUTH ONCE DAILY BEFORE BREAKFAST 06/05/20   06/07/20, MD  furosemide (LASIX) 20 MG tablet Take 1 tablet (20 mg total) by mouth every other day. 06/30/20   End, 13/10/21, MD  MAGNESIUM PO Take 1 tablet by mouth daily.    [provider]  Multiple Vitamin (MULTIVITAMIN) tablet Take 1 tablet by mouth daily.    [provider]  Multiple Vitamins-Minerals (ZINC PO) Take by mouth daily.    [provider]  nitroGLYCERIN (NITROSTAT) 0.4 MG SL tablet Place 1 tablet (0.4 mg total) under the tongue every 5 (five) minutes as needed for chest pain (Maximum 3 doses.). 12/24/19   End, 02/23/20, MD  Omega-3 Fatty Acids (OMEGA 3 PO) Take by mouth daily.    [provider]  ondansetron (ZOFRAN-ODT) 4 MG disintegrating tablet Take 1 tablet (4 mg total) by mouth every 8 (eight) hours as needed for nausea or vomiting. 04/20/20   04/22/20, MD  polyethylene glycol powder (GLYCOLAX/MIRALAX) 17 GM/SCOOP powder Take 17 g by mouth daily. 05/01/20   07/01/20, MD  Potassium 99 MG TABS Take 1 tablet by mouth daily.    [provider]  rosuvastatin (CRESTOR) 20 MG tablet Take 1 tablet (20 mg total) by mouth daily. 06/30/20   End, 13/10/21, MD  VITAMIN A PO Take 2,400 mcg by mouth daily.     [provider]  vitamin E 400 UNIT capsule  Take 400 Units by mouth daily.    [provider]  zaleplon (SONATA) 10 MG capsule Take 1 capsule (10 mg total) by mouth at bedtime as needed for sleep. 10/24/19   12/24/19, MD  Zinc 50 MG CAPS Take 50 mg by mouth daily.    [provider]     Positive ROS: Otherwise negative  All other systems have been reviewed and were otherwise negative with the exception of those mentioned in the HPI and as above.  Physical Exam: Constitutional: Alert, well-appearing, no acute distress Ears: External ears without lesions or tenderness.  Ear canals with moderate wax buildup on both sides that was cleaned with curette.  TMs were otherwise clear.  Dix-Hallpike testing was negative. Nasal: External nose without lesions. Septum mild deformity with mild rhinitis. Clear nasal passages. Oral: Lips and gums without lesions. Tongue and palate mucosa without lesions. Posterior oropharynx clear.  Tonsil regions appear benign bilaterally.  He has inflammatory changes of the buccal mucosa on both sides left side worse than right.  There is one minor raised area superiorly just above the parotid duct area that was little concerning.  This areas were biopsied.  Topical Hurricaine spray was used followed by 1 cc of Xylocaine with epinephrine and then a small biopsy was obtained with scalpel and scissors and sent to pathology.  Hemostasis was obtained with silver nitrate.  He tolerated this well. Neck: No palpable adenopathy or masses.  No palpable adenopathy in the neck. Respiratory: Breathing comfortably  Skin: No facial/neck lesions or rash noted.  Cerumen impaction removal  Date/Time: 08/25/2020 12:49 PM Performed by: 10/23/2020, MD Authorized by: Drema Halon, MD   Consent:    Consent obtained:  Verbal   Consent given by:  Patient   Risks discussed:  Pain and bleeding Procedure details:    Location:  L ear and R ear  Procedure type: curette and suction    Post-procedure details:    Inspection:  TM intact and canal normal   Hearing quality:  Improved   Patient tolerance of procedure:  Tolerated well, no immediate complications Comments:     TMs are clear bilaterally. Oropharynx Biopsy  Date/Time: 08/25/2020 12:50 PM Performed by: Rozetta Nunnery, MD Authorized by: Rozetta Nunnery, MD   Consent:    Consent obtained:  Verbal   Consent given by:  Patient Indications:    Indications:  Patient with chronic buccal mucosa mucositis and a single slightly firm nodule in the buccal mucosa on the left side Anesthesia:    Anesthesia method:  Local infiltration   Local anesthetic:  Lidocaine 1% WITH epi Procedure Details:    Location:  Mouth Post-procedure details:    Patient tolerance of procedure:  Tolerated well, no immediate complications Comments:     Patient had a biopsy of a left buccal mucosal lesion and tolerated this well.    Assessment: Chronic inflammatory changes of both buccal mucosa might represent lichen planus versus other chronic inflammatory changes. Hearing loss with decreased sense of balance but no evidence of vertigo on exam today.  Plan: A biopsy of the left upper buccal mucosa was obtained in the office today.  Patient will call concerning results of the biopsy next Monday or Tuesday. He will follow-up in 6 to 7 months for recheck.   Radene Journey, MD

## 2020-08-27 LAB — SURGICAL PATHOLOGY

## 2020-09-04 ENCOUNTER — Telehealth (INDEPENDENT_AMBULATORY_CARE_PROVIDER_SITE_OTHER): Payer: Self-pay | Admitting: Otolaryngology

## 2020-09-04 NOTE — Telephone Encounter (Signed)
Called Phillip [name] concerning results of his biopsy.  This showed chronic inflammation and no evidence of dysplasia or malignancy.  He apparently had seen the results on MyChart.  He is doing well otherwise.

## 2020-09-15 ENCOUNTER — Ambulatory Visit: Payer: PPO | Admitting: Dermatology

## 2020-09-15 ENCOUNTER — Encounter: Payer: Self-pay | Admitting: Dermatology

## 2020-09-15 ENCOUNTER — Other Ambulatory Visit: Payer: Self-pay

## 2020-09-15 DIAGNOSIS — Z85828 Personal history of other malignant neoplasm of skin: Secondary | ICD-10-CM

## 2020-09-15 DIAGNOSIS — I872 Venous insufficiency (chronic) (peripheral): Secondary | ICD-10-CM | POA: Diagnosis not present

## 2020-09-15 DIAGNOSIS — L570 Actinic keratosis: Secondary | ICD-10-CM | POA: Diagnosis not present

## 2020-09-15 DIAGNOSIS — D229 Melanocytic nevi, unspecified: Secondary | ICD-10-CM | POA: Diagnosis not present

## 2020-09-15 DIAGNOSIS — L814 Other melanin hyperpigmentation: Secondary | ICD-10-CM | POA: Diagnosis not present

## 2020-09-15 DIAGNOSIS — L820 Inflamed seborrheic keratosis: Secondary | ICD-10-CM | POA: Diagnosis not present

## 2020-09-15 DIAGNOSIS — Z1283 Encounter for screening for malignant neoplasm of skin: Secondary | ICD-10-CM

## 2020-09-15 DIAGNOSIS — L578 Other skin changes due to chronic exposure to nonionizing radiation: Secondary | ICD-10-CM | POA: Diagnosis not present

## 2020-09-15 DIAGNOSIS — L821 Other seborrheic keratosis: Secondary | ICD-10-CM

## 2020-09-15 DIAGNOSIS — Z872 Personal history of diseases of the skin and subcutaneous tissue: Secondary | ICD-10-CM | POA: Diagnosis not present

## 2020-09-15 DIAGNOSIS — B351 Tinea unguium: Secondary | ICD-10-CM | POA: Diagnosis not present

## 2020-09-15 DIAGNOSIS — L918 Other hypertrophic disorders of the skin: Secondary | ICD-10-CM | POA: Diagnosis not present

## 2020-09-15 DIAGNOSIS — D1800 Hemangioma unspecified site: Secondary | ICD-10-CM

## 2020-09-15 MED ORDER — TAVABOROLE 5 % EX SOLN: 1.0000 "application " | Freq: Every day | CUTANEOUS | 11 refills | Status: DC

## 2020-09-15 NOTE — Progress Notes (Signed)
Follow-Up Visit   Subjective  Phillip [name] is a 80 y.o. male who presents for the following: Total body skin exam (Hx of BCC, SCCs, AKs) and check spots (Mole on R abdomen, /Scaly spots L cheek/White patch L neck). The patient presents for Total-Body Skin Exam (TBSE) for skin cancer screening and mole check.  The following portions of the chart were reviewed this encounter and updated as appropriate:   Tobacco  Allergies  Meds  Problems  Med Hx  Surg Hx  Fam Hx     Review of Systems:  No other skin or systemic complaints except as noted in HPI or Assessment and Plan.  Objective  Well appearing patient in no apparent distress; mood and affect are within normal limits.  A full examination was performed including scalp, head, eyes, ears, nose, lips, neck, chest, axillae, abdomen, back, buttocks, bilateral upper extremities, bilateral lower extremities, hands, feet, fingers, toes, fingernails, and toenails. All findings within normal limits unless otherwise noted below.  Objective  face x 2, scalp x 1 (3): Pink scaly macules   Objective  face x 18 (18): Erythematous keratotic or waxy stuck-on papule or plaque.   Objective  L forehead above lateral brow: Well healed scar with no evidence of recurrence.   Objective  bil lower legs: Stasis changes  Objective  crown scalp, L distal lateral deltoid: Well healed scar with no evidence of recurrence, no lymphadenopathy.   Objective  bil feet/toenails: Toenail dystrophy   Assessment & Plan    Lentigines - Scattered tan macules - Discussed due to sun exposure - Benign, observe - Call for any changes  Seborrheic Keratoses - Stuck-on, waxy, tan-brown papules and plaques  - Discussed benign etiology and prognosis. - Observe - Call for any changes  Melanocytic Nevi - Tan-brown and/or pink-flesh-colored symmetric macules and papules - Benign appearing on exam today - Observation - Call clinic for new or changing  moles - Recommend daily use of broad spectrum spf 30+ sunscreen to sun-exposed areas.   Hemangiomas - Red papules - Discussed benign nature - Observe - Call for any changes  Actinic Damage - Chronic, secondary to cumulative UV/sun exposure - diffuse scaly erythematous macules with underlying dyspigmentation - Recommend daily broad spectrum sunscreen SPF 30+ to sun-exposed areas, reapply every 2 hours as needed.  - Call for new or changing lesions.  Skin cancer screening performed today.  Acrochordons (Skin Tags) - Fleshy, skin-colored pedunculated papules - Benign appearing.  - Observe. - If desired, they can be removed with an in office procedure that is not covered by insurance. - Please call the clinic if you notice any new or changing lesions.  AK (actinic keratosis) (3) face x 2, scalp x 1  Destruction of lesion - face x 2, scalp x 1 Complexity: simple   Destruction method: cryotherapy   Informed consent: discussed and consent obtained   Timeout:  patient name, date of birth, surgical site, and procedure verified Lesion destroyed using liquid nitrogen: Yes   Region frozen until ice ball extended beyond lesion: Yes   Outcome: patient tolerated procedure well with no complications   Post-procedure details: wound care instructions given    Inflamed seborrheic keratosis (18) face x 18  Destruction of lesion - face x 18 Complexity: simple   Destruction method: cryotherapy   Informed consent: discussed and consent obtained   Timeout:  patient name, date of birth, surgical site, and procedure verified Lesion destroyed using liquid nitrogen: Yes   Region  frozen until ice ball extended beyond lesion: Yes   Outcome: patient tolerated procedure well with no complications   Post-procedure details: wound care instructions given    History of basal cell carcinoma (BCC) L forehead above lateral brow  Clear. Observe for recurrence. Call clinic for new or changing lesions.   Recommend regular skin exams, daily broad-spectrum spf 30+ sunscreen use, and photoprotection.     Stasis dermatitis of both legs bil lower legs Benign, observe  History of SCC (squamous cell carcinoma) of skin crown scalp, L distal lateral deltoid Clear. Observe for recurrence. Call clinic for new or changing lesions.  Recommend regular skin exams, daily broad-spectrum spf 30+ sunscreen use, and photoprotection.     Tinea unguium bil feet/toenails Chronic, persistent Start Kerydin solution qhs to affected toenails Tavaborole (KERYDIN) 5 % SOLN - bil feet/toenails  Return in about 1 year (around 09/15/2021) for TBSE, Hx of BCC, Hx of SCC, Hx of AKs.   I, Othelia Pulling, RMA, am acting as scribe for Sarina Ser, MD .  Documentation: I have reviewed the above documentation for accuracy and completeness, and I agree with the above.  Sarina Ser, MD

## 2020-09-17 ENCOUNTER — Encounter: Payer: Self-pay | Admitting: Dermatology

## 2020-10-12 DIAGNOSIS — H1131 Conjunctival hemorrhage, right eye: Secondary | ICD-10-CM | POA: Diagnosis not present

## 2020-10-13 ENCOUNTER — Telehealth: Payer: Self-pay | Admitting: Family Medicine
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
## 2020-10-13 NOTE — Telephone Encounter (Signed)
Noted. Will see at [place] in 2 wks.

## 2020-10-14 ENCOUNTER — Other Ambulatory Visit: Payer: Self-pay

## 2020-10-14 ENCOUNTER — Other Ambulatory Visit
Admission: RE | Admit: 2020-10-14 | Discharge: 2020-10-14 | Disposition: A | Discharge status: Home / Self Care | Payer: PPO | Source: Ambulatory Visit | Attending: Internal Medicine | Admitting: Internal Medicine

## 2020-10-14 ENCOUNTER — Ambulatory Visit: Payer: PPO | Admitting: Internal Medicine

## 2020-10-14 ENCOUNTER — Encounter: Payer: Self-pay | Admitting: Internal Medicine

## 2020-10-14 VITALS — BP 140/72 | HR 72 | Ht 74.0 in | Wt 209.0 lb

## 2020-10-14 DIAGNOSIS — I255 Ischemic cardiomyopathy: Secondary | ICD-10-CM | POA: Diagnosis not present

## 2020-10-14 DIAGNOSIS — I10 Essential (primary) hypertension: Secondary | ICD-10-CM | POA: Diagnosis not present

## 2020-10-14 DIAGNOSIS — I2510 Atherosclerotic heart disease of native coronary artery without angina pectoris: Secondary | ICD-10-CM

## 2020-10-14 DIAGNOSIS — E785 Hyperlipidemia, unspecified: Secondary | ICD-10-CM

## 2020-10-14 DIAGNOSIS — R2689 Other abnormalities of gait and mobility: Secondary | ICD-10-CM

## 2020-10-14 LAB — BASIC METABOLIC PANEL
Anion gap: 8 (ref 5–15)
BUN: 26 mg/dL — ABNORMAL HIGH (ref 8–23)
CO2: 26 mmol/L (ref 22–32)
Calcium: 9.6 mg/dL (ref 8.9–10.3)
Chloride: 105 mmol/L (ref 98–111)
Creatinine, Ser: 0.94 mg/dL (ref 0.61–1.24)
GFR, Estimated: >60 mL/min (ref >60)
Glucose, Bld: 113 mg/dL — ABNORMAL HIGH (ref 70–99)
Potassium: 4.8 mmol/L (ref 3.5–5.1)
Sodium: 139 mmol/L (ref 135–145)

## 2020-10-14 MED ORDER — FUROSEMIDE 20 MG PO TABS: 20.0000 mg | ORAL_TABLET | Freq: Every day | ORAL | 2 refills | Status: DC | PRN

## 2020-10-14 MED ORDER — AMLODIPINE BESYLATE 10 MG PO TABS: 10.0000 mg | ORAL_TABLET | Freq: Every day | ORAL | 1 refills | Status: DC

## 2020-10-14 MED ORDER — HYDROCHLOROTHIAZIDE 25 MG PO TABS: 12.5000 mg | ORAL_TABLET | Freq: Every day | ORAL | 1 refills | Status: DC

## 2020-10-14 NOTE — Patient Instructions (Addendum)
Medication Instructions:  Your physician has recommended you make the following change in your medication:  1- CHANGE Furosemide to 20 mg by mouth daily as needed for edema or weight gain. 2- START Hydrochlorothiazide 12.5 mg (0.5 tablet) by mouth once a day.  *If you need a refill on your cardiac medications before your next appointment, please call your pharmacy*   Lab Work: Your physician recommends that you return for lab work in: TODAY for BMET at [facility].  If you have labs (blood work) drawn today and your tests are completely normal, you will receive your results only by: Marland Kitchen MyChart Message (if you have MyChart) OR . A paper copy in the mail If you have any lab test that is abnormal or we need to change your treatment, we will call you to review the results.  Testing/Procedures: none  Follow-Up: At [facility], you and your health needs are our priority.  As part of our continuing mission to provide you with exceptional heart care, we have created designated Provider Care Teams.  These Care Teams include your primary Cardiologist (physician) and Advanced Practice Providers (APPs -  Physician Assistants and Nurse Practitioners) who all work together to provide you with the care you need, when you need it.  We recommend signing up for the patient portal called "MyChart".  Sign up information is provided on this After Visit Summary.  MyChart is used to connect with patients for Virtual Visits (Telemedicine).  Patients are able to view lab/test results, encounter notes, upcoming appointments, etc.  Non-urgent messages can be sent to your provider as well.   To learn more about what you can do with MyChart, go to [email or web address].    Your next appointment:   1 month(s)  The format for your next appointment:   In Person  Provider:   You will see one of the following Advanced Practice Providers on your designated Care Team:    Murray Hodgkins, NP  Christell Faith,  PA-C  Marrianne Mood, PA-C  Cadence Washington, [place]  Laurann Montana, NP

## 2020-10-14 NOTE — Progress Notes (Unsigned)
Follow-up Outpatient Visit Date: 10/14/2020  Primary Care Provider: [name], MD [place] [address]  Chief Complaint: Follow-up coronary artery disease and ischemic cardiomyopathy  HPI:  Phillip [name] is a 80 y.o. male with history of coronary artery disease status post CABG in [id number]/[id number], chronic systolic heart failure secondary to ischemic cardiomyopathy,post-operative atrial flutter,hypertension,andhyperlipidemia, who presents for follow-up of CAD and cardiomyopathy.  I last saw Phillip [name] in November, at which time he complained of intermittent issues with balance.  This began around the time that he was diagnosed with COVID-19.  He was otherwise feeling fairly well.  We agreed to obtain an echocardiogram, which showed mildly reduced LVEF that was unchanged from 2019.  Today, Phillip [name] reports that he is feeling better.  He has not experienced any further episodes of profound dizziness though his balance is still not what it used to be 10 years ago.  He has recently started exercising at [employer] and is planning to participate in [place] [employer] [place] this spring.  He has not had any chest pain, shortness of breath, palpitations, or lightheadedness.  Mild leg edema is stable.  He recently had a "broken blood vessel" in his right eye and wonders if his blood pressure could have contributed to this.  He notes labile readings ranging from 110-180/50-92, though most readings are similar to today's in the office.  He has been compliant with his medications.  He admits to not limiting his sodium intake.  --------------------------------------------------------------------------------------------------  Past Medical History:  Diagnosis Date  . Anxiety   . Atrial flutter ([place]) 01/07/2018   Post-op after CABG  . Basal cell carcinoma 09/12/2018   Left forehead above lateral brow. Nodular pattern  . Cancer ([place])   . Coronary artery disease 2003   a.  nonobstructive CAD by cath in 2003 and 2005; b.  Status post three-vessel CABG on 12/12/2017 with LIMA to LAD, sequential reverse SVG to OM1 and distal LCx  . COVID-19 03/2020  . Diverticulosis   . Hearing loss    bilateral  . Heart disease   . Heart murmur   . History of benign prostatic hypertrophy   . History of radiation therapy 01/31/10-03/22/10   right tonsil/right neck node 7000 cGy 35 sessions, high risk lymph node volume 5940 cGy 35 sessions, low risk lymph node vol 5600 cGy 35 sessions  . 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 HTN (hypertension)   . Hyperlipidemia    diet controlled- taking medication d/t ensure drinking  . Hypothyroid 01/16/2013  . Ischemic cardiomyopathy    a. 10/2017: echo showing reduced EF of 40-45%, HK of the anterior, anteroseptal and apical myocardium with Grade 1 DD.   Marland Kitchen Neck pain   . Squamous cell carcinoma    right tonsil- 2011  . Squamous cell carcinoma of skin 09/02/2019   Crown scalp. WD SCC  . Squamous cell carcinoma of skin 09/02/2019   Left distal lateral deltoid. SCCis arising in SK  . Thrombocytopenia ([place]) 02/02/2012   unclear etiology  . Tinnitus   . Tubular adenoma of colon    Past Surgical History:  Procedure Laterality Date  . artoscopic knee    . CARDIAC CATHETERIZATION  2003, 2005   40% blockage, two 30% blockages treated medically Ron [facility])  . CARDIOVERSION N/A 02/12/2018   Procedure: CARDIOVERSION;  Surgeon: [name], MD;  Location: [facility];  Service: Cardiovascular;  Laterality: N/A;  . COLONOSCOPY  03/2015   TA x3,  HP, melanosis coli, severe diverticulosis, rpt 3 yrs ([place])  . COLONOSCOPY  01/2019   fair prep, diverticulosis, f/u PRN ([place])  . CORONARY ARTERY BYPASS GRAFT N/A 12/12/2017   Procedure: CORONARY ARTERY BYPASS GRAFTING (CABG) x3 using the right greater saphenous vein harvested endoscopically and the left internal mammary artery. LIMA to LAD, SEQ SVG to OM1 & OM2;  Surgeon: [name], MD;  Location: [facility];   Service: Open Heart Surgery;  Laterality: N/A;  . DENTAL SURGERY     extractions  . IR THORACENTESIS ASP PLEURAL SPACE W/IMG GUIDE  01/17/2018  . KNEE ARTHROSCOPY Left   . LEFT HEART CATH AND CORONARY ANGIOGRAPHY N/A 12/10/2017   Procedure: LEFT HEART CATH AND CORONARY ANGIOGRAPHY;  Surgeon: [name], MD;  Location: [facility];  Service: Cardiovascular;  Laterality: N/A;  . PEG PLACEMENT  02/2010  . TEE WITHOUT CARDIOVERSION N/A 12/12/2017   Procedure: TRANSESOPHAGEAL ECHOCARDIOGRAM (TEE);  Surgeon: [name], MD;  Location: [facility];  Service: Open Heart Surgery;  Laterality: N/A;  . TONSILLECTOMY    . triple bypass  11/2017  . VASECTOMY      Current Meds  Medication Sig  . aspirin EC 81 MG tablet Take 1 tablet (81 mg total) by mouth daily.  . Cholecalciferol (VITAMIN D3) 25 MCG (1000 UT) CAPS Take 1 capsule by mouth daily.  Marland Kitchen docusate sodium (COLACE) 250 MG capsule Takes total 600mg  /day  . EUTHYROX 25 MCG tablet TAKE 1 TABLET BY MOUTH ONCE DAILY BEFORE BREAKFAST  . hydrochlorothiazide (HYDRODIURIL) 25 MG tablet Take 0.5 tablets (12.5 mg total) by mouth daily.  Marland Kitchen MAGNESIUM PO Take 1 tablet by mouth daily.  . Multiple Vitamin (MULTIVITAMIN) tablet Take 1 tablet by mouth daily.  . Multiple Vitamins-Minerals (ZINC PO) Take by mouth daily.  . nitroGLYCERIN (NITROSTAT) 0.4 MG SL tablet Place 1 tablet (0.4 mg total) under the tongue every 5 (five) minutes as needed for chest pain (Maximum 3 doses.).  Marland Kitchen Omega-3 Fatty Acids (OMEGA 3 PO) Take by mouth daily.  . ondansetron (ZOFRAN-ODT) 4 MG disintegrating tablet Take 1 tablet (4 mg total) by mouth every 8 (eight) hours as needed for nausea or vomiting.  . polyethylene glycol powder (GLYCOLAX/MIRALAX) 17 GM/SCOOP powder Take 17 g by mouth daily.  . Potassium 99 MG TABS Take 1 tablet by mouth daily.  . rosuvastatin (CRESTOR) 20 MG tablet Take 1 tablet (20 mg total) by mouth daily.  . Tavaborole (KERYDIN) 5 % SOLN Apply 1  application topically at bedtime. Apply to affected toenails qhs  . VITAMIN A PO Take 2,400 mcg by mouth daily.   . vitamin E 400 UNIT capsule Take 400 Units by mouth daily.  . zaleplon (SONATA) 10 MG capsule Take 1 capsule (10 mg total) by mouth at bedtime as needed for sleep.  Marland Kitchen Zinc 50 MG CAPS Take 50 mg by mouth daily.  . [DISCONTINUED] amLODipine (NORVASC) 10 MG tablet Take 1 tablet (10 mg total) by mouth daily.  . [DISCONTINUED] furosemide (LASIX) 20 MG tablet Take 1 tablet (20 mg total) by mouth every other day.    Allergies: Ace inhibitors and Morphine and related  Social History   Tobacco Use  . Smoking status: Never Smoker  . Smokeless tobacco: Never Used  Vaping Use  . Vaping Use: Never used  Substance Use Topics  . Alcohol use: Yes    Comment: wine 1-2 times a month   . Drug use: No    Family  History  Problem Relation Age of Onset  . Coronary artery disease Father   . Heart attack Father 54  . Heart disease Father   . Stroke Mother   . Colon cancer Neg Hx   . Esophageal cancer Neg Hx   . Rectal cancer Neg Hx   . Stomach cancer Neg Hx   . Kidney cancer Neg Hx   . Kidney failure Neg Hx   . Prostate cancer Neg Hx   . Tuberculosis Neg Hx     Review of Systems: A 12-system review of systems was performed and was negative except as noted in the HPI.  --------------------------------------------------------------------------------------------------  Physical Exam: BP 140/72 (BP Location: Left Arm, Patient Position: Sitting, Cuff Size: Normal)   Pulse 72   Ht 6\' 2"  (1.88 m)   Wt 209 lb (94.8 kg)   SpO2 96%   BMI 26.83 kg/m   General:  NAD. Neck: No JVD or HJR. Lungs: Clear to auscultation bilaterally without wheezes or crackles. Heart: Regular rate and rhythm with 2/6 systolic murmur.  No rubs or gallops. Abdomen: Soft, nontender, nondistended. Extremities: No lower extremity edema.   Lab Results  Component Value Date   WBC 7.4 04/28/2020   HGB  15.8 04/28/2020   HCT 46.4 04/28/2020   MCV 97.2 04/28/2020   PLT 216.0 04/28/2020    Lab Results  Component Value Date   NA 141 04/28/2020   K 4.5 04/28/2020   CL 103 04/28/2020   CO2 30 04/28/2020   BUN 14 04/28/2020   CREATININE 0.75 04/28/2020   GLUCOSE 123 (H) 04/28/2020   ALT 24 04/28/2020    Lab Results  Component Value Date   CHOL 96 04/28/2020   HDL 36.20 (L) 04/28/2020   LDLCALC 45 04/28/2020   LDLDIRECT 101.0 10/13/2014   TRIG 74.0 04/28/2020   CHOLHDL 3 04/28/2020    --------------------------------------------------------------------------------------------------  ASSESSMENT AND PLAN: Coronary artery disease: Phillip [name] continues to do well without angina and is increasing his activity in anticipation of participating in the [employer] this spring.  Continue current medications for secondary prevention of CAD.  Balance problem: Balance is improving without any further episodes of marked instability.  Recent echo showed preserved mildly reduced LVEF and no other structural abnormalities to explain symptoms.  No further work-up recommended at this time.  Ischemic cardiomyopathy: LVEF mildly reduced on repeat echo.  Unfortunately, Phillip [name] has been intolerant of GDMT due to baseline bradycardia as well as angioedema on ACE inhibitor's.  We will initiate HCTZ for improved blood pressure control.  He can use furosemide 20 mg daily as needed for weight gain/edema.  Hypertension: Blood pressure mildly elevated today and often at home.  We have agreed to add HCTZ 12.5 mg daily and check a BMP today.  We will have him return in 1 month for blood pressure check.  Repeat BMP should be obtained at that time.  We will continue with amlodipine 10 mg daily.  Hyperlipidemia: Lipids well controlled.  Continue rosuvastatin 20 mg daily.  Follow-up: Return to clinic in 1 month for blood pressure check and repeat BMP.  [name], MD 10/14/2020 10:46 AM

## 2020-10-15 ENCOUNTER — Encounter: Payer: Self-pay | Admitting: Internal Medicine

## 2020-10-21 ENCOUNTER — Other Ambulatory Visit: Payer: Self-pay | Admitting: Internal Medicine

## 2020-10-29 ENCOUNTER — Other Ambulatory Visit: Payer: Self-pay

## 2020-10-29 ENCOUNTER — Ambulatory Visit (INDEPENDENT_AMBULATORY_CARE_PROVIDER_SITE_OTHER): Payer: PPO | Admitting: Family Medicine

## 2020-10-29 ENCOUNTER — Encounter: Payer: Self-pay | Admitting: Family Medicine

## 2020-10-29 VITALS — BP 136/80 | HR 66 | Temp 97.9°F | Ht 74.0 in | Wt 208.5 lb

## 2020-10-29 DIAGNOSIS — N3281 Overactive bladder: Secondary | ICD-10-CM | POA: Diagnosis not present

## 2020-10-29 DIAGNOSIS — H9193 Unspecified hearing loss, bilateral: Secondary | ICD-10-CM | POA: Diagnosis not present

## 2020-10-29 DIAGNOSIS — R351 Nocturia: Secondary | ICD-10-CM | POA: Diagnosis not present

## 2020-10-29 DIAGNOSIS — R454 Irritability and anger: Secondary | ICD-10-CM | POA: Diagnosis not present

## 2020-10-29 DIAGNOSIS — I5022 Chronic systolic (congestive) heart failure: Secondary | ICD-10-CM | POA: Diagnosis not present

## 2020-10-29 DIAGNOSIS — N401 Enlarged prostate with lower urinary tract symptoms: Secondary | ICD-10-CM | POA: Diagnosis not present

## 2020-10-29 MED ORDER — SERTRALINE HCL 25 MG PO TABS: 25.0000 mg | ORAL_TABLET | Freq: Every day | ORAL | 3 refills | Status: DC

## 2020-10-29 NOTE — Patient Instructions (Addendum)
Try sertraline 25mg  daily for the next month  Schedule follow up visit in 4-6 weeks.  We will do formal memory assessment at that time.

## 2020-10-29 NOTE — Progress Notes (Signed)
Patient ID: Phillip [name], male    DOB: 1941-05-10, 80 y.o.   MRN: [id number]  This visit was conducted in person.  BP 136/80   Pulse 66   Temp 97.9 F (36.6 C) (Temporal)   Ht 6\' 2"  (1.88 m)   Wt 208 lb 8 oz (94.6 kg)   SpO2 96%   BMI 26.77 kg/m   No exam data present   CC: 6 mo f/u visit  Subjective:   HPI: Phillip [name] is a 80 y.o. male presenting on 10/29/2020 for Follow-up (C/o 6 mo f/u.  Pt accompanied by his wife, Phillip [name]- temp 98.1.)   Here with wife today.  Notes worsening hearing trouble despite hearing aides. Last saw audiologist a few months ago.   Longstanding dysphagia after tonsillar cancer treatment, managed with 9 glucerna shakes/day in addition to 1 solid food meal/day. He recently changed from boost to glucerna to decrease sugar content.   Saw Dr End - lasix recently stopped, hctz started in its place.   Longstanding easy irritability as well as easy anger. This can affect relationship with family and friends. Affecting relationship with wife. She fears for his safety as he can be confrontational to strangers. He feels it is his responsibility to correct societal wrongs as he perceives them.  Wife notes behavioral changes, escalating recently.  Denies trouble with memory.   Previously underwent counseling - did not find this helpful. He doesn't note any trouble and says this is his nature.   Ongoing nocturia 4-5x/night likely related to high volume fluid intake. Saw urology, PTNS without benefit. Wears depens pull-ups regularly.   Planning to participate in [place] this spring.      Relevant past medical, surgical, family and social history reviewed and updated as indicated. Interim medical history since our last visit reviewed. Allergies and medications reviewed and updated. Outpatient Medications Prior to Visit  Medication Sig Dispense Refill  . amLODipine (NORVASC) 10 MG tablet Take 1 tablet (10 mg total) by mouth daily. 90 tablet 1  . aspirin EC  81 MG tablet Take 1 tablet (81 mg total) by mouth daily. 90 tablet 3  . Cholecalciferol (VITAMIN D3) 25 MCG (1000 UT) CAPS Take 1 capsule by mouth daily.    Marland Kitchen docusate sodium (COLACE) 250 MG capsule Takes total 600mg  /day    . EUTHYROX 25 MCG tablet TAKE 1 TABLET BY MOUTH ONCE DAILY BEFORE BREAKFAST 30 tablet 11  . hydrochlorothiazide (HYDRODIURIL) 25 MG tablet Take 0.5 tablets (12.5 mg total) by mouth daily. 90 tablet 1  . MAGNESIUM PO Take 1 tablet by mouth daily.    . Multiple Vitamins-Minerals (ZINC PO) Take by mouth daily.    . nitroGLYCERIN (NITROSTAT) 0.4 MG SL tablet Place 1 tablet (0.4 mg total) under the tongue every 5 (five) minutes as needed for chest pain (Maximum 3 doses.). 25 tablet 2  . Omega-3 Fatty Acids (OMEGA 3 PO) Take by mouth daily.    . ondansetron (ZOFRAN-ODT) 4 MG disintegrating tablet Take 1 tablet (4 mg total) by mouth every 8 (eight) hours as needed for nausea or vomiting. 20 tablet 0  . polyethylene glycol powder (GLYCOLAX/MIRALAX) 17 GM/SCOOP powder Take 17 g by mouth daily. (Patient taking differently: Take 17 g by mouth daily. As needed)    . Potassium 99 MG TABS Take 1 tablet by mouth daily.    . rosuvastatin (CRESTOR) 20 MG tablet Take 1 tablet by mouth once daily 90 tablet 0  . VITAMIN  A PO Take 2,400 mcg by mouth daily.     . vitamin E 400 UNIT capsule Take 400 Units by mouth daily.    . zaleplon (SONATA) 10 MG capsule Take 1 capsule (10 mg total) by mouth at bedtime as needed for sleep. 30 capsule 0  . Zinc 50 MG CAPS Take 50 mg by mouth daily.    . Multiple Vitamin (MULTIVITAMIN) tablet Take 1 tablet by mouth daily.    . furosemide (LASIX) 20 MG tablet Take 1 tablet (20 mg total) by mouth daily as needed for edema (or weight gain.). 30 tablet 2  . Tavaborole (KERYDIN) 5 % SOLN Apply 1 application topically at bedtime. Apply to affected toenails qhs 10 mL 11   No facility-administered medications prior to visit.     Per HPI unless specifically indicated  in ROS section below Review of Systems Objective:  BP 136/80   Pulse 66   Temp 97.9 F (36.6 C) (Temporal)   Ht 6\' 2"  (1.88 m)   Wt 208 lb 8 oz (94.6 kg)   SpO2 96%   BMI 26.77 kg/m   Wt Readings from Last 3 Encounters:  10/29/20 208 lb 8 oz (94.6 kg)  10/14/20 209 lb (94.8 kg)  06/30/20 204 lb (92.5 kg)      Physical Exam Vitals and nursing note reviewed.  Constitutional:      Appearance: Normal appearance. He is not ill-appearing.  Neurological:     Mental Status: He is alert.  Psychiatric:        Attention and Perception: Attention normal. He does not perceive auditory or visual hallucinations.        Mood and Affect: Mood is not depressed.        Thought Content: Thought content normal.        Cognition and Memory: Cognition and memory normal.     Comments:  Circumferential  Easily agitated with discussion of life views       Results for orders placed or performed during the hospital encounter of 57/84/69  Basic metabolic panel  Result Value Ref Range   Sodium 139 135 - 145 mmol/L   Potassium 4.8 3.5 - 5.1 mmol/L   Chloride 105 98 - 111 mmol/L   CO2 26 22 - 32 mmol/L   Glucose, Bld 113 (H) 70 - 99 mg/dL   BUN 26 (H) 8 - 23 mg/dL   Creatinine, Ser 0.94 0.61 - 1.24 mg/dL   Calcium 9.6 8.9 - 10.3 mg/dL   GFR, Estimated >60 >60 mL/min   Anion gap 8 5 - 15   Lab Results  Component Value Date   TSH 3.27 04/28/2020    Depression screen [facility] 2/9 04/28/2020 04/25/2019 02/07/2018 11/07/2017 12/18/2016  Decreased Interest 0 0 0 0 -  Down, Depressed, Hopeless 0 0 0 0 0  PHQ - 2 Score 0 0 0 0 0  Altered sleeping 0 - 0 0 -  Tired, decreased energy 0 - 1 0 -  Change in appetite 0 - 3 0 -  Feeling bad or failure about yourself  0 - 0 0 -  Trouble concentrating 0 - 0 0 -  Moving slowly or fidgety/restless 0 - 0 0 -  Suicidal thoughts 0 - 0 0 -  PHQ-9 Score 0 - 4 0 -  Difficult doing work/chores Not difficult at all - Not difficult at all Not difficult at all -  Some  recent data might be hidden  No flowsheet data found.  Assessment & Plan:  This visit occurred during the SARS-CoV-2 public health emergency.  Safety protocols were in place, including screening questions prior to the visit, additional usage of staff PPE, and extensive cleaning of exam room while observing appropriate contact time as indicated for disinfecting solutions.   Problem List Items Addressed This Visit    BPH associated with nocturia    Ongoing difficulty along with OAB. Has seen urology.  Now off meds. Continues using pads regularly.       Hearing loss    Ongoing trouble despite hearing aides.       Detrusor instability of bladder    S/p uro eval. Failed multiple meds as well as PTNS.  Wears depends briefs regularly.  Discussed relation of polyuria/frequency to fluid intake.       Chronic systolic heart failure (HCC)    Lasix recently changed to PRN, hctz 12.5mg  daily added.       Irritability and anger - Primary    Longstanding issue, affecting family relations.  Discussed this with patient, encnouraged he renew efforts to stay calm and avoid agitation.  Pt not too concerned but wife remains highly worried.  Previous counseling not helpful. Agrees to trial of sertraline for irritability/anger.  RTC 4-6 wks f/u visit. Consider geriatric assessment at that time.           Meds ordered this encounter  Medications  . sertraline (ZOLOFT) 25 MG tablet    Sig: Take 1 tablet (25 mg total) by mouth daily.    Dispense:  30 tablet    Refill:  3   No orders of the defined types were placed in this encounter.   Patient Instructions  Try sertraline 25mg  daily for the next month  Schedule follow up visit in 4-6 weeks.  We will do formal memory assessment at that time.    Follow up plan: Return in about 4 weeks (around 11/26/2020) for follow up visit.  [name], MD

## 2020-10-30 DIAGNOSIS — R454 Irritability and anger: Secondary | ICD-10-CM | POA: Insufficient documentation

## 2020-10-30 NOTE — Assessment & Plan Note (Signed)
Ongoing difficulty along with OAB. Has seen urology.  Now off meds. Continues using pads regularly.

## 2020-10-30 NOTE — Assessment & Plan Note (Addendum)
Longstanding issue, affecting family relations.  Discussed this with patient, encnouraged he renew efforts to stay calm and avoid agitation.  Pt not too concerned but wife remains highly worried.  Previous counseling not helpful. Agrees to trial of sertraline for irritability/anger.  RTC 4-6 wks f/u visit. Consider geriatric assessment at that time.

## 2020-10-30 NOTE — Assessment & Plan Note (Addendum)
S/p uro eval. Failed multiple meds as well as PTNS.  Wears depends briefs regularly.  Discussed relation of polyuria/frequency to fluid intake.

## 2020-10-30 NOTE — Assessment & Plan Note (Addendum)
Lasix recently changed to PRN, hctz 12.5mg  daily added.

## 2020-10-30 NOTE — Assessment & Plan Note (Signed)
Ongoing trouble despite hearing aides.

## 2020-10-31 ENCOUNTER — Telehealth: Payer: Self-pay | Admitting: Family Medicine

## 2020-10-31 NOTE — Telephone Encounter (Addendum)
Late entry - wife brings letter to recent OV - concerned about longstanding agitation/irritability/anger with more recent behavioral changes - cross-dressing, decreased inhibition, she wonders about ?narcissistic personality. Distressing changes, wife worried about dementia.  longterm h/o high sugar intake (diet predominantly consisted of 12 cartons of ensure daily for years).  See OV. Will need to eval for FTD.

## 2020-11-10 ENCOUNTER — Other Ambulatory Visit
Admission: RE | Admit: 2020-11-10 | Discharge: 2020-11-10 | Disposition: A | Discharge status: Home / Self Care | Payer: PPO | Source: Ambulatory Visit | Attending: Physician Assistant | Admitting: Physician Assistant

## 2020-11-10 ENCOUNTER — Encounter: Payer: Self-pay | Admitting: Physician Assistant

## 2020-11-10 ENCOUNTER — Other Ambulatory Visit: Payer: Self-pay

## 2020-11-10 ENCOUNTER — Ambulatory Visit: Payer: PPO | Admitting: Physician Assistant

## 2020-11-10 VITALS — BP 126/62 | HR 60 | Ht 74.0 in | Wt 208.0 lb

## 2020-11-10 DIAGNOSIS — I255 Ischemic cardiomyopathy: Secondary | ICD-10-CM | POA: Diagnosis not present

## 2020-11-10 DIAGNOSIS — I483 Typical atrial flutter: Secondary | ICD-10-CM

## 2020-11-10 DIAGNOSIS — I10 Essential (primary) hypertension: Secondary | ICD-10-CM | POA: Insufficient documentation

## 2020-11-10 DIAGNOSIS — Z951 Presence of aortocoronary bypass graft: Secondary | ICD-10-CM

## 2020-11-10 DIAGNOSIS — I5022 Chronic systolic (congestive) heart failure: Secondary | ICD-10-CM | POA: Diagnosis not present

## 2020-11-10 DIAGNOSIS — E785 Hyperlipidemia, unspecified: Secondary | ICD-10-CM | POA: Diagnosis not present

## 2020-11-10 DIAGNOSIS — I2510 Atherosclerotic heart disease of native coronary artery without angina pectoris: Secondary | ICD-10-CM

## 2020-11-10 LAB — BASIC METABOLIC PANEL
Anion gap: 8 (ref 5–15)
BUN: 34 mg/dL — ABNORMAL HIGH (ref 8–23)
CO2: 27 mmol/L (ref 22–32)
Calcium: 9.3 mg/dL (ref 8.9–10.3)
Chloride: 104 mmol/L (ref 98–111)
Creatinine, Ser: 0.86 mg/dL (ref 0.61–1.24)
GFR, Estimated: >60 mL/min (ref >60)
Glucose, Bld: 117 mg/dL — ABNORMAL HIGH (ref 70–99)
Potassium: 4.2 mmol/L (ref 3.5–5.1)
Sodium: 139 mmol/L (ref 135–145)

## 2020-11-10 NOTE — Progress Notes (Signed)
Office Visit    Patient Name: Phillip [name] Date of Encounter: 11/10/2020  PCP:  [name], [facility]  Cardiologist:  [name], MD  Advanced Practice Provider:  No care team member to display Electrophysiologist:  None   Chief Complaint    Chief Complaint  Patient presents with  . Follow-up    1 month    80 year old male with history of CAD s/p CABG in 08/6107, chronic systolic heart failure secondary to ischemic cardiomyopathy, postoperative atrial flutter, hypertension, hyperlipidemia, and who presents today for CAD and cardiomyopathy 1 month follow-up.  Past Medical History    Past Medical History:  Diagnosis Date  . Anxiety   . Atrial flutter ([place]) 01/07/2018   Post-op after CABG  . Basal cell carcinoma 09/12/2018   Left forehead above lateral brow. Nodular pattern  . Cancer ([place])   . Coronary artery disease 2003   a. nonobstructive CAD by cath in 2003 and 2005; b.  Status post three-vessel CABG on 12/12/2017 with LIMA to LAD, sequential reverse SVG to OM1 and distal LCx  . COVID-19 03/2020  . Diverticulosis   . Hearing loss    bilateral  . Heart disease   . Heart murmur   . History of benign prostatic hypertrophy   . History of radiation therapy 01/31/10-03/22/10   right tonsil/right neck node 7000 cGy 35 sessions, high risk lymph node volume 5940 cGy 35 sessions, low risk lymph node vol 5600 cGy 35 sessions  . 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 HTN (hypertension)   . Hyperlipidemia    diet controlled- taking medication d/t ensure drinking  . Hypothyroid 01/16/2013  . Ischemic cardiomyopathy    a. 10/2017: echo showing reduced EF of 40-45%, HK of the anterior, anteroseptal and apical myocardium with Grade 1 DD.   Marland Kitchen Neck pain   . Squamous cell carcinoma    right tonsil- 2011  . Squamous cell carcinoma of skin 09/02/2019   Crown scalp. WD SCC  . Squamous cell carcinoma of skin 09/02/2019   Left distal lateral deltoid.  SCCis arising in SK  . Thrombocytopenia ([place]) 02/02/2012   unclear etiology  . Tinnitus   . Tubular adenoma of colon    Past Surgical History:  Procedure Laterality Date  . artoscopic knee    . CARDIAC CATHETERIZATION  2003, 2005   40% blockage, two 30% blockages treated medically Ron Parker)  . CARDIOVERSION N/A 02/12/2018   Procedure: CARDIOVERSION;  Surgeon: [name], MD;  Location: [facility];  Service: Cardiovascular;  Laterality: N/A;  . COLONOSCOPY  03/2015   TA x3, HP, melanosis coli, severe diverticulosis, rpt 3 yrs ([place])  . COLONOSCOPY  01/2019   fair prep, diverticulosis, f/u PRN ([place])  . CORONARY ARTERY BYPASS GRAFT N/A 12/12/2017   Procedure: CORONARY ARTERY BYPASS GRAFTING (CABG) x3 using the right greater saphenous vein harvested endoscopically and the left internal mammary artery. LIMA to LAD, SEQ SVG to OM1 & OM2;  Surgeon: [name], MD;  Location: [place];  Service: Open Heart Surgery;  Laterality: N/A;  . DENTAL SURGERY     extractions  . IR THORACENTESIS ASP PLEURAL SPACE W/IMG GUIDE  01/17/2018  . KNEE ARTHROSCOPY Left   . LEFT HEART CATH AND CORONARY ANGIOGRAPHY N/A 12/10/2017   Procedure: LEFT HEART CATH AND CORONARY ANGIOGRAPHY;  Surgeon: [name], MD;  Location: [place] [facility];  Service: Cardiovascular;  Laterality: N/A;  . PEG PLACEMENT  02/2010  . TEE WITHOUT CARDIOVERSION N/A 12/12/2017   Procedure: TRANSESOPHAGEAL ECHOCARDIOGRAM (TEE);  Surgeon: [name], MD;  Location: [place];  Service: Open Heart Surgery;  Laterality: N/A;  . TONSILLECTOMY    . triple bypass  11/2017  . VASECTOMY      Allergies  Allergies  Allergen Reactions  . Ace Inhibitors Swelling and Other (See Comments)    Possible angioedema (upper lip swelling)  . Morphine And Related Swelling    SWELLING REACTION UNSPECIFIED  [severity rated per PMH, 12/11/2017]    History of Present Illness    Phillip [name] is a 80 y.o. male with PMH as above.  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  He has history of CAD s/p CABG 08/6107, chronic systolic heart failure secondary to ischemic cardiomyopathy, postop A. fib, hypertension, hyperlipidemia.    He was diagnosed with COVID-19 in fall 2021.  In that setting, he had intermittent issues with balance.    Echo was obtained which showed mildly reduced LVEF, unchanged from 2019.    When last seen by his primary cardiologist, he was feeling better without further episodes of dizziness.  He felt his balance still was not what it was 10 years ago.  He had started exercising at [employer].  He was going to participate in the senior games this spring.  He has mild leg edema was stable.  He reported a recent broken blood vessel in his right eye and wondered if his BP contributed to this.  Labile BP readings were noted from SBP 110-180 and DBP 50-92.  It was noted that he was intolerant of GDMT due to baseline bradycardia, as well as angioedema on ACE inhibitors.  HCTZ was initiated for BP control.  Lasix 20 mg daily was continued for as needed weight gain or edema.  Today, 11/09/2020, he returns to clinic and notes he is overall doing well from a cardiac standpoint. He denies chest pain, palpitations, dyspnea, pnd, orthopnea, n, v, dizziness, syncope, edema, weight gain, or early satiety.  He reports that he is training for senior games.  He enjoys golf, [place], the 1500 m power walk, football, discus, and shotput.  He has not started running yet, but he feels that he is overall doing well with the amount of activity that he is able to do at his age.  He reports that everyone is separated into 5-year increments for competition, and he recently was bumped into a new increment, so he is interested to see what the changes are in this new group.  He reports going to [employer], where he can use the treadmill. He is part of a senior hiking group that does 1 hike per week and with an instructor that is very well trained. His  blood pressure has greatly improved since the medication changes as above.  He reports 2 episodes of higher blood pressure before taking his pills for the day, which we discussed is perfectly acceptable.  He reports occasional insomnia, mainly because he has to get up every hour to use the restroom. He is somewhat distressed over recently starting Zoloft but otherwise doing well. No s/sx of bleeding. He reports eating very well.   Home Medications    Current Outpatient Medications on File Prior to Visit  Medication Sig Dispense Refill  . amLODipine (NORVASC) 10 MG tablet Take 1 tablet (10 mg total) by mouth daily. 90 tablet 1  . aspirin EC 81 MG tablet Take 1 tablet (81  mg total) by mouth daily. 90 tablet 3  . Cholecalciferol (VITAMIN D3) 25 MCG (1000 UT) CAPS Take 1 capsule by mouth daily.    Marland Kitchen docusate sodium (COLACE) 250 MG capsule Takes total 600mg  /day    . EUTHYROX 25 MCG tablet TAKE 1 TABLET BY MOUTH ONCE DAILY BEFORE BREAKFAST 30 tablet 11  . hydrochlorothiazide (HYDRODIURIL) 25 MG tablet Take 0.5 tablets (12.5 mg total) by mouth daily. 90 tablet 1  . MAGNESIUM PO Take 1 tablet by mouth daily.    . Multiple Vitamins-Minerals (ZINC PO) Take by mouth daily.    . nitroGLYCERIN (NITROSTAT) 0.4 MG SL tablet Place 1 tablet (0.4 mg total) under the tongue every 5 (five) minutes as needed for chest pain (Maximum 3 doses.). 25 tablet 2  . Omega-3 Fatty Acids (OMEGA 3 PO) Take by mouth daily.    . ondansetron (ZOFRAN-ODT) 4 MG disintegrating tablet Take 1 tablet (4 mg total) by mouth every 8 (eight) hours as needed for nausea or vomiting. 20 tablet 0  . polyethylene glycol powder (GLYCOLAX/MIRALAX) 17 GM/SCOOP powder Take 17 g by mouth daily. (Patient taking differently: Take 17 g by mouth daily. As needed)    . Potassium 99 MG TABS Take 1 tablet by mouth daily.    . rosuvastatin (CRESTOR) 20 MG tablet Take 1 tablet by mouth once daily 90 tablet 0  . sertraline (ZOLOFT) 25 MG tablet Take 1 tablet  (25 mg total) by mouth daily. 30 tablet 3  . VITAMIN A PO Take 2,400 mcg by mouth daily.     . vitamin E 400 UNIT capsule Take 400 Units by mouth daily.    . zaleplon (SONATA) 10 MG capsule Take 1 capsule (10 mg total) by mouth at bedtime as needed for sleep. 30 capsule 0  . Zinc 50 MG CAPS Take 50 mg by mouth daily.     No current facility-administered medications on file prior to visit.    Review of Systems    He denies chest pain, palpitations, dyspnea, pnd, orthopnea, n, v, dizziness, syncope, edema, weight gain, or early satiety. He reports occasional nights where he does not get as much sleep due to needing to use the restroo.   All other systems reviewed and are otherwise negative except as noted above.  Physical Exam    VS:  BP 126/62   Pulse 60   Ht 6\' 2"  (1.88 m)   Wt 208 lb (94.3 kg)   BMI 26.71 kg/m  , BMI Body mass index is 26.71 kg/m. GEN: Well nourished, well developed, in no acute distress. HEENT: normal. Neck: Supple, no JVD, carotid bruits, or masses. Cardiac: RRR, no murmurs, rubs, or gallops. No clubbing, cyanosis, edema.  Radials/DP/PT 2+ and equal bilaterally.  Respiratory:  Respirations regular and unlabored, clear to auscultation bilaterally. GI: Soft, nontender, nondistended, BS + x 4. MS: no deformity or atrophy. Skin: warm and dry, no rash. Neuro:  Strength and sensation are intact. Psych: Normal affect.  Accessory Clinical Findings    ECG personally reviewed by me today -sinus rhythm with first-degree AV block, ventricular rate 60 bpm, PR interval 216 ms, QRS 126 ms, QTC 436 ms, LVH with repolarization abnormalities- no acute changes.  VITALS Reviewed today   Temp Readings from Last 3 Encounters:  10/29/20 97.9 F (36.6 C) (Temporal)  08/25/20 (!) 97.2 F (36.2 C) (Tympanic)  04/30/20 98 F (36.7 C) (Temporal)   BP Readings from Last 3 Encounters:  11/10/20 126/62  10/29/20 136/80  10/14/20 140/72   Pulse Readings from Last 3 Encounters:   11/10/20 60  10/29/20 66  10/14/20 72    Wt Readings from Last 3 Encounters:  11/10/20 208 lb (94.3 kg)  10/29/20 208 lb 8 oz (94.6 kg)  10/14/20 209 lb (94.8 kg)     LABS  reviewed today   Lab Results  Component Value Date   WBC 7.4 04/28/2020   HGB 15.8 04/28/2020   HCT 46.4 04/28/2020   MCV 97.2 04/28/2020   PLT 216.0 04/28/2020   Lab Results  Component Value Date   CREATININE 0.94 10/14/2020   BUN 26 (H) 10/14/2020   NA 139 10/14/2020   K 4.8 10/14/2020   CL 105 10/14/2020   CO2 26 10/14/2020   Lab Results  Component Value Date   ALT 24 04/28/2020   AST 20 04/28/2020   ALKPHOS 60 04/28/2020   BILITOT 1.1 04/28/2020   Lab Results  Component Value Date   CHOL 96 04/28/2020   HDL 36.20 (L) 04/28/2020   LDLCALC 45 04/28/2020   LDLDIRECT 101.0 10/13/2014   TRIG 74.0 04/28/2020   CHOLHDL 3 04/28/2020    Lab Results  Component Value Date   HGBA1C 6.3 04/28/2020   Lab Results  Component Value Date   TSH 3.27 04/28/2020     STUDIES/PROCEDURES reviewed today   Echo 07/2020 1. Left ventricular ejection fraction, by estimation, is 45 to 50%. The  left ventricle has mildly decreased function. The left ventricle  demonstrates global hypokinesis. There is mild left ventricular  hypertrophy. Left ventricular diastolic parameters  are consistent with Grade I diastolic dysfunction (impaired relaxation).  2. Right ventricular systolic function is normal. The right ventricular  size is normal.  3. Left atrial size was mildly dilated.  4. Right atrial size was mildly dilated.  5. The mitral valve is normal in structure. Trivial mitral valve  regurgitation. Moderate mitral annular calcification.  6. The aortic valve has an indeterminant number of cusps. There is mild  thickening of the aortic valve. Aortic valve regurgitation is not  visualized. Mild aortic valve sclerosis is present, with no evidence of  aortic valve stenosis.  7. The inferior vena  cava is dilated in size with >50% respiratory  variability, suggesting right atrial pressure of 8 mmHg.    Carotids 04/2019 Summary:  Right Carotid: Velocities in the right ICA are consistent with a 1-39%  stenosis.  Left Carotid: Velocities in the left ICA are consistent with a 1-39%  stenosis.        Non-hemodynamically significant plaque <50% noted in the  CCA.  Vertebrals: Bilateral vertebral arteries demonstrate antegrade flow.  Subclavians: Right subclavian artery flow was disturbed. Normal flow        hemodynamics were seen in the left subclavian artery.   03/2018 Ambulatory cardiac monitoring  The patient was monitored for 14 days.  The predominant rhythm was sinus with an average rate of 61 bpm (range 36 to 114 bpm in sinus).  Rare PACs and PVCs were noted, including supraventricular and ventricular couplets and triplets.  A single episode of nonsustained ventricular tachycardia lasting 8 beats was identified with a maximal rate of 150 bpm. A brief episode of accelerated idioventricular rhythm was also noted.  32 atrial runs were identified, lasting up to 20 beats, with a maximal rate of 158 bpm.  There were no patient triggered events. Predominantly sinus rhythm with rare supraventricular and ventricular activity.  Multiple brief atrial runs lasting up to 20  beats were noted as well as a single 8 beat run of nonsustained ventricular tachycardia.  Significant sinus bradycardia (predominantly overnight) also noted raising the possibility of sleep apnea.  01/2018= DCCV  11/2017 MPI  Blood pressure demonstrated a normal response to exercise.  Defect 1: There is a large defect of severe severity present in the mid anterior, mid anteroseptal, apical anterior, apical septal and apex location.  Findings consistent with prior myocardial infarction with significant peri-infarct ischemia.  This is a high risk study.  Nuclear stress EF: 34%.  11/2017=  CABG   LHC  11/2017 Conclusions: 1. Significant 2-vessel coronary artery disease with chronic total occlusion of proximal LAD and 80-90% proximal codominant LCx stenosis.  The LMCA as well as the proximal and mid LAD/LCx are heavily calcified.  The mid/distal LAD fill via left-to-left and right-to-left collaterals. 2. Moderately elevated left ventricular filling pressure.  LVEF known to be moderately reduced by recent echo. Recommendations: 1. Given recent chest pain at rest and severe proximal LAD and LCx disease (LMCA-equivalent), I will admit Mr. Fick for medical optimization and cardiac surgery consultation for CABG. 2. Initiate heparin infusion in 8 hours. 3. Hold amlodipine, given low LVEF, and initiate isosorbide mononitrate 30 mg daily. 4. Start carvedilol 3.125 mg BID.  Defer adding ACEI/ARB given history of angioedema with lisinopril. 5. Gentle diuresis. Furosemide 20 mg IV daily ordered.    Assessment & Plan    Coronary artery disease s/p CABG -No anginal symptoms reported.  He is very active as outlined in HPI above and without any symptoms with exertion or at rest.  Continue current medications for secondary prevention of CAD.  ICM --LVEF mildly reduced on repeat echo.  As previously reported, he is intolerant of GDMT due to baseline bradycardia and angioedema on ACE inhibitors.  HCTZ was initiated at a previous visit with updated labs showing stable renal function.  Continue HCTZ 12.5 mg daily with as needed furosemide 20 mg for weight gain/edema.  Hypertension --BP improved from previous visit and 126/62.  Started on HCTZ 12.5 mg daily with recheck of renal function stable (repeat BMET performed today).  Continue HCTZ with amlodipine 10 mg daily.  Hyperlipidemia -LDL well controlled.  Continue Crestor 20 mg daily.   Disposition: RTC 6 months to 1 year  Arvil Chaco, PA-C 11/10/2020

## 2020-11-10 NOTE — Patient Instructions (Signed)
Medication Instructions:  Your physician recommends that you continue on your current medications as directed. Please refer to the Current Medication list given to you today.  *If you need a refill on your cardiac medications before your next appointment, please call your pharmacy*   Lab Work: Your physician recommends that you have lab work TODAY: [facility] -  Please go to the [facility]. You will check in at the front desk to the right as you walk into the atrium.    If you have labs (blood work) drawn today and your tests are completely normal, you will receive your results only by: Marland Kitchen MyChart Message (if you have MyChart) OR . A paper copy in the mail If you have any lab test that is abnormal or we need to change your treatment, we will call you to review the results.   Testing/Procedures: None ordered   Follow-Up: At [facility], you and your health needs are our priority.  As part of our continuing mission to provide you with exceptional heart care, we have created designated Provider Care Teams.  These Care Teams include your primary Cardiologist (physician) and Advanced Practice Providers (APPs -  Physician Assistants and Nurse Practitioners) who all work together to provide you with the care you need, when you need it.  We recommend signing up for the patient portal called "MyChart".  Sign up information is provided on this After Visit Summary.  MyChart is used to connect with patients for Virtual Visits (Telemedicine).  Patients are able to view lab/test results, encounter notes, upcoming appointments, etc.  Non-urgent messages can be sent to your provider as well.   To learn more about what you can do with MyChart, go to [email or web address].    Your next appointment:   6 month(s) - 1 year  The format for your next appointment:   In Person  Provider:   You may see [name], MD or one of the following Advanced Practice Providers on your designated Care Team:     Murray Hodgkins, NP  Christell Faith, PA-C  Marrianne Mood, PA-C  [place], [place]  Laurann Montana, NP

## 2020-11-26 ENCOUNTER — Encounter: Payer: Self-pay | Admitting: Family Medicine

## 2020-11-26 ENCOUNTER — Other Ambulatory Visit: Payer: Self-pay

## 2020-11-26 ENCOUNTER — Ambulatory Visit (INDEPENDENT_AMBULATORY_CARE_PROVIDER_SITE_OTHER): Payer: PPO | Admitting: Family Medicine

## 2020-11-26 VITALS — BP 132/74 | HR 61 | Temp 97.6°F | Ht 74.0 in | Wt 210.2 lb

## 2020-11-26 DIAGNOSIS — I10 Essential (primary) hypertension: Secondary | ICD-10-CM | POA: Diagnosis not present

## 2020-11-26 DIAGNOSIS — R454 Irritability and anger: Secondary | ICD-10-CM

## 2020-11-26 DIAGNOSIS — N3281 Overactive bladder: Secondary | ICD-10-CM

## 2020-11-26 DIAGNOSIS — R351 Nocturia: Secondary | ICD-10-CM

## 2020-11-26 DIAGNOSIS — N401 Enlarged prostate with lower urinary tract symptoms: Secondary | ICD-10-CM | POA: Diagnosis not present

## 2020-11-26 DIAGNOSIS — E039 Hypothyroidism, unspecified: Secondary | ICD-10-CM

## 2020-11-26 LAB — T4, FREE: Free T4: 0.69 ng/dL (ref 0.60–1.60)

## 2020-11-26 LAB — TSH: TSH: 4.09 u[IU]/mL (ref 0.35–4.50)

## 2020-11-26 MED ORDER — TAMSULOSIN HCL 0.4 MG PO CAPS: 0.4000 mg | ORAL_CAPSULE | Freq: Every day | ORAL | 3 refills | Status: DC

## 2020-11-26 MED ORDER — SERTRALINE HCL 25 MG PO TABS: 25.0000 mg | ORAL_TABLET | Freq: Every day | ORAL | 2 refills | Status: DC

## 2020-11-26 NOTE — Patient Instructions (Addendum)
Good to see you today  Labs today  I think sertraline 25mg  is beneficial - continue this in the morning.  Add flomax 0.4mg  nightly or with supper for bladder symptoms at night, update me with effect. Take for a month and if helpful, may continue taking this.  Return as needed or after 04/30/2021 for next wellness visit/physical.

## 2020-11-26 NOTE — Progress Notes (Signed)
Patient ID: Phillip [name], male    DOB: Sep 22, 1940, 80 y.o.   MRN: [id number]  This visit was conducted in person.  BP 132/74   Pulse 61   Temp 97.6 F (36.4 C) (Temporal)   Ht 6\' 2"  (1.88 m)   Wt 210 lb 3 oz (95.3 kg)   SpO2 96%   BMI 26.99 kg/m    BP Readings from Last 3 Encounters:  11/26/20 132/74  11/10/20 126/62  10/29/20 136/80    CC: 4 wk mood f/u visit  Subjective:   HPI: Phillip [name] is a 80 y.o. male presenting on 11/26/2020 for Follow-up (Here for 4 wk mood f/u.  Pt accompanied by wife, Fraser Din- temp 97.6.)   See prior note for details.  Last visit we started sertraline 25mg  daily for longstanding irritability/easy anger affecting relationships with family and friends.  Some concerns about mood changes.  He previously underwent counseling - did not find this helpful.  Since starting sertraline 25mg  - tolerating med well, notes more mellow, more level.  Patient denies memory changes, wife notes they continue tracking calendar regularly - pt states this is unchanged.   BP elevated today - despite regular amlodipine 10mg , hctz 12.5mg  daily. lasix recently changed to PRN then stopped. He tracks BP at home and it runs well controlled.   MMSE today: 27/30 serial 7s, 29/30 serial 3s, CDT score 4/4.  Planning to participate in annual Senior Games this spring.   Ongoing struggle with night time awakenings to urinate - uses daily depends.      Relevant past medical, surgical, family and social history reviewed and updated as indicated. Interim medical history since our last visit reviewed. Allergies and medications reviewed and updated. Outpatient Medications Prior to Visit  Medication Sig Dispense Refill  . amLODipine (NORVASC) 10 MG tablet Take 1 tablet (10 mg total) by mouth daily. 90 tablet 1  . aspirin EC 81 MG tablet Take 1 tablet (81 mg total) by mouth daily. 90 tablet 3  . Cholecalciferol (VITAMIN D3) 25 MCG (1000 UT) CAPS Take 1 capsule by mouth daily.    Marland Kitchen  docusate sodium (COLACE) 250 MG capsule Takes total 600mg  /day    . EUTHYROX 25 MCG tablet TAKE 1 TABLET BY MOUTH ONCE DAILY BEFORE BREAKFAST 30 tablet 11  . hydrochlorothiazide (HYDRODIURIL) 25 MG tablet Take 0.5 tablets (12.5 mg total) by mouth daily. 90 tablet 1  . MAGNESIUM PO Take 1 tablet by mouth daily.    . Multiple Vitamins-Minerals (ZINC PO) Take by mouth daily.    . nitroGLYCERIN (NITROSTAT) 0.4 MG SL tablet Place 1 tablet (0.4 mg total) under the tongue every 5 (five) minutes as needed for chest pain (Maximum 3 doses.). 25 tablet 2  . Omega-3 Fatty Acids (OMEGA 3 PO) Take by mouth daily.    . ondansetron (ZOFRAN-ODT) 4 MG disintegrating tablet Take 1 tablet (4 mg total) by mouth every 8 (eight) hours as needed for nausea or vomiting. 20 tablet 0  . polyethylene glycol powder (GLYCOLAX/MIRALAX) 17 GM/SCOOP powder Take 17 g by mouth daily. (Patient taking differently: Take 17 g by mouth daily. As needed)    . Potassium 99 MG TABS Take 1 tablet by mouth daily.    . rosuvastatin (CRESTOR) 20 MG tablet Take 1 tablet by mouth once daily 90 tablet 0  . VITAMIN A PO Take 2,400 mcg by mouth daily.     . vitamin E 400 UNIT capsule Take 400 Units by mouth  daily.    . zaleplon (SONATA) 10 MG capsule Take 1 capsule (10 mg total) by mouth at bedtime as needed for sleep. 30 capsule 0  . Zinc 50 MG CAPS Take 50 mg by mouth daily.    . sertraline (ZOLOFT) 25 MG tablet Take 1 tablet (25 mg total) by mouth daily. 30 tablet 3   No facility-administered medications prior to visit.     Per HPI unless specifically indicated in ROS section below Review of Systems Objective:  BP 132/74   Pulse 61   Temp 97.6 F (36.4 C) (Temporal)   Ht 6\' 2"  (1.88 m)   Wt 210 lb 3 oz (95.3 kg)   SpO2 96%   BMI 26.99 kg/m   Wt Readings from Last 3 Encounters:  11/26/20 210 lb 3 oz (95.3 kg)  11/10/20 208 lb (94.3 kg)  10/29/20 208 lb 8 oz (94.6 kg)      Physical Exam Vitals and nursing note reviewed.   Constitutional:      Appearance: Normal appearance. He is not ill-appearing.  Cardiovascular:     Rate and Rhythm: Normal rate and regular rhythm.     Pulses: Normal pulses.     Heart sounds: Normal heart sounds. No murmur heard.   Pulmonary:     Effort: Pulmonary effort is normal. No respiratory distress.     Breath sounds: Normal breath sounds. No wheezing, rhonchi or rales.  Neurological:     Mental Status: He is alert.  Psychiatric:        Mood and Affect: Mood normal.        Behavior: Behavior normal.       Results for orders placed or performed in visit on 11/26/20  TSH  Result Value Ref Range   TSH 4.09 0.35 - 4.50 uIU/mL  T4, free  Result Value Ref Range   Free T4 0.69 0.60 - 1.60 ng/dL   Depression screen [facility] 2/9 11/26/2020 04/28/2020 04/25/2019 02/07/2018 11/07/2017  Decreased Interest 0 0 0 0 0  Down, Depressed, Hopeless 0 0 0 0 0  PHQ - 2 Score 0 0 0 0 0  Altered sleeping 1 0 - 0 0  Tired, decreased energy 1 0 - 1 0  Change in appetite 0 0 - 3 0  Feeling bad or failure about yourself  0 0 - 0 0  Trouble concentrating 0 0 - 0 0  Moving slowly or fidgety/restless 0 0 - 0 0  Suicidal thoughts 0 0 - 0 0  PHQ-9 Score 2 0 - 4 0  Difficult doing work/chores - Not difficult at all - Not difficult at all Not difficult at all  Some recent data might be hidden    GAD 7 : Generalized Anxiety Score 11/26/2020  Nervous, Anxious, on Edge 1  Control/stop worrying 0  Worry too much - different things 1  Trouble relaxing 0  Restless 0  Easily annoyed or irritable 1  Afraid - awful might happen 0  Total GAD 7 Score 3   Assessment & Plan:  This visit occurred during the SARS-CoV-2 public health emergency.  Safety protocols were in place, including screening questions prior to the visit, additional usage of staff PPE, and extensive cleaning of exam room while observing appropriate contact time as indicated for disinfecting solutions.   Problem List Items Addressed This Visit     Essential hypertension    Chronic, stable.  BP initially elevated, improved on retesting.       BPH associated  with nocturia    Saw urology - thought BPH with OAB due to detrusor instability. Reviewed with patient. Has failed multiple medications including flomax, myrbetriq and PTNS. Discussed retrail flomax vs IR anticholinergic - will start with flomax, update with effect. Check IPSS next visit.       Hypothyroidism, acquired    Does endorse fatigue and trouble sleeping with chronic constipation  Update levels - discussed option to increase dose even if levels return low normal.       Relevant Orders   TSH (Completed)   T4, free (Completed)   Detrusor instability of bladder    See above. S/p uro eval.  Discussed options.  Will retrial flomax, if ineffective, consider trial oxybutynin IR at night.       Irritability and anger - Primary    Pt and wife note improvement in irritability and agitation. Tolerating medication well. Will continue this.           Meds ordered this encounter  Medications  . sertraline (ZOLOFT) 25 MG tablet    Sig: Take 1 tablet (25 mg total) by mouth daily.    Dispense:  90 tablet    Refill:  2  . tamsulosin (FLOMAX) 0.4 MG CAPS capsule    Sig: Take 1 capsule (0.4 mg total) by mouth daily after supper.    Dispense:  30 capsule    Refill:  3   Orders Placed This Encounter  Procedures  . TSH  . T4, free    Patient Instructions  Good to see you today  Labs today  I think sertraline 25mg  is beneficial - continue this in the morning.  Add flomax 0.4mg  nightly or with supper for bladder symptoms at night, update me with effect. Take for a month and if helpful, may continue taking this.  Return as needed or after 04/30/2021 for next wellness visit/physical.    Follow up plan: Return in about 5 months (around 04/30/2021) for annual exam, prior fasting for blood work, medicare wellness visit.  [name], MD

## 2020-11-28 ENCOUNTER — Other Ambulatory Visit: Payer: Self-pay | Admitting: Family Medicine

## 2020-11-28 MED ORDER — LEVOTHYROXINE SODIUM 50 MCG PO TABS: 50.0000 ug | ORAL_TABLET | Freq: Every day | ORAL | 9 refills | Status: DC

## 2020-11-28 NOTE — Assessment & Plan Note (Addendum)
Saw urology - thought BPH with OAB due to detrusor instability. Reviewed with patient. Has failed multiple medications including flomax, myrbetriq and PTNS. Discussed retrail flomax vs IR anticholinergic - will start with flomax, update with effect. Check IPSS next visit.

## 2020-11-28 NOTE — Assessment & Plan Note (Signed)
See above. S/p uro eval.  Discussed options.  Will retrial flomax, if ineffective, consider trial oxybutynin IR at night.

## 2020-11-28 NOTE — Assessment & Plan Note (Signed)
Does endorse fatigue and trouble sleeping with chronic constipation  Update levels - discussed option to increase dose even if levels return low normal.

## 2020-11-28 NOTE — Assessment & Plan Note (Addendum)
Pt and wife note improvement in irritability and agitation. Tolerating medication well. Will continue this.

## 2020-11-28 NOTE — Assessment & Plan Note (Signed)
Chronic, stable.  BP initially elevated, improved on retesting.

## 2020-12-13 ENCOUNTER — Other Ambulatory Visit: Payer: Self-pay | Admitting: Family Medicine

## 2020-12-13 DIAGNOSIS — E039 Hypothyroidism, unspecified: Secondary | ICD-10-CM

## 2020-12-13 DIAGNOSIS — R7303 Prediabetes: Secondary | ICD-10-CM

## 2020-12-15 ENCOUNTER — Other Ambulatory Visit (INDEPENDENT_AMBULATORY_CARE_PROVIDER_SITE_OTHER): Payer: PPO

## 2020-12-15 ENCOUNTER — Other Ambulatory Visit: Payer: Self-pay

## 2020-12-15 DIAGNOSIS — E039 Hypothyroidism, unspecified: Secondary | ICD-10-CM | POA: Diagnosis not present

## 2020-12-15 DIAGNOSIS — R7303 Prediabetes: Secondary | ICD-10-CM | POA: Diagnosis not present

## 2020-12-15 LAB — TSH: TSH: 2.95 u[IU]/mL (ref 0.35–4.50)

## 2020-12-15 LAB — HEMOGLOBIN A1C: Hgb A1c MFr Bld: 6.1 % (ref 4.6–6.5)

## 2020-12-15 LAB — T4, FREE: Free T4: 0.86 ng/dL (ref 0.60–1.60)

## 2021-01-24 ENCOUNTER — Other Ambulatory Visit: Payer: Self-pay | Admitting: Internal Medicine

## 2021-01-26 ENCOUNTER — Ambulatory Visit (INDEPENDENT_AMBULATORY_CARE_PROVIDER_SITE_OTHER): Payer: PPO | Admitting: Otolaryngology

## 2021-01-26 ENCOUNTER — Other Ambulatory Visit: Payer: Self-pay

## 2021-01-26 ENCOUNTER — Encounter (INDEPENDENT_AMBULATORY_CARE_PROVIDER_SITE_OTHER): Payer: Self-pay | Admitting: Otolaryngology

## 2021-01-26 VITALS — Temp 97.2°F

## 2021-01-26 DIAGNOSIS — H903 Sensorineural hearing loss, bilateral: Secondary | ICD-10-CM

## 2021-01-26 DIAGNOSIS — Z85818 Personal history of malignant neoplasm of other sites of lip, oral cavity, and pharynx: Secondary | ICD-10-CM

## 2021-01-26 DIAGNOSIS — H6123 Impacted cerumen, bilateral: Secondary | ICD-10-CM

## 2021-01-26 NOTE — Progress Notes (Signed)
HPI: Phillip [name] is a 80 y.o. male who returns today for evaluation of wax buildup in his ears and buccal mucosal lesions..  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  He has done well with this with no signs of recurrence although has had some chronic mucosal changes of his buccal mucosa where he sometimes bites his buccal mucosa with his teeth as have some chronic inflammatory changes that may be representative lichen planus.  A biopsy was performed 6 months ago that demonstrated just acute and chronic inflammation and no dysplasia. He also presents because of decreased hearing.  He has severe hearing loss in both ears he is having more trouble using his hearing aids.  He inquires about possible cochlear implant.  Past Medical History:  Diagnosis Date  . Anxiety   . Atrial flutter ([place]) 01/07/2018   Post-op after CABG  . Basal cell carcinoma 09/12/2018   Left forehead above lateral brow. Nodular pattern  . Cancer ([place])   . Coronary artery disease 2003   a. nonobstructive CAD by cath in 2003 and 2005; b.  Status post three-vessel CABG on 12/12/2017 with LIMA to LAD, sequential reverse SVG to OM1 and distal LCx  . COVID-19 03/2020  . Diverticulosis   . Hearing loss    bilateral  . Heart disease   . Heart murmur   . History of benign prostatic hypertrophy   . History of radiation therapy 01/31/10-03/22/10   right tonsil/right neck node 7000 cGy 35 sessions, high risk lymph node volume 5940 cGy 35 sessions, low risk lymph node vol 5600 cGy 35 sessions  . 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 HTN (hypertension)   . Hyperlipidemia    diet controlled- taking medication d/t ensure drinking  . Hypothyroid 01/16/2013  . Ischemic cardiomyopathy    a. 10/2017: echo showing reduced EF of 40-45%, HK of the anterior, anteroseptal and apical myocardium with Grade 1 DD.   Marland Kitchen Neck pain   . Squamous cell carcinoma    right tonsil- 2011   . Squamous cell carcinoma of skin 09/02/2019   Crown scalp. WD SCC  . Squamous cell carcinoma of skin 09/02/2019   Left distal lateral deltoid. SCCis arising in SK  . Thrombocytopenia ([place]) 02/02/2012   unclear etiology  . Tinnitus   . Tubular adenoma of colon    Past Surgical History:  Procedure Laterality Date  . artoscopic knee    . CARDIAC CATHETERIZATION  2003, 2005   40% blockage, two 30% blockages treated medically Ron Parker)  . CARDIOVERSION N/A 02/12/2018   Procedure: CARDIOVERSION;  Surgeon: [name], MD;  Location: [facility];  Service: Cardiovascular;  Laterality: N/A;  . COLONOSCOPY  03/2015   TA x3, HP, melanosis coli, severe diverticulosis, rpt 3 yrs ([place])  . COLONOSCOPY  01/2019   fair prep, diverticulosis, f/u PRN ([place])  . CORONARY ARTERY BYPASS GRAFT N/A 12/12/2017   Procedure: CORONARY ARTERY BYPASS GRAFTING (CABG) x3 using the right greater saphenous vein harvested endoscopically and the left internal mammary artery. LIMA to LAD, SEQ SVG to OM1 & OM2;  Surgeon: [name], MD;  Location: [facility];  Service: Open Heart Surgery;  Laterality: N/A;  . DENTAL SURGERY     extractions  . IR THORACENTESIS ASP PLEURAL SPACE W/IMG GUIDE  01/17/2018  . KNEE ARTHROSCOPY Left   . LEFT HEART CATH AND CORONARY ANGIOGRAPHY N/A 12/10/2017  Procedure: LEFT HEART CATH AND CORONARY ANGIOGRAPHY;  Surgeon: [name], MD;  Location: [facility];  Service: Cardiovascular;  Laterality: N/A;  . PEG PLACEMENT  02/2010  . TEE WITHOUT CARDIOVERSION N/A 12/12/2017   Procedure: TRANSESOPHAGEAL ECHOCARDIOGRAM (TEE);  Surgeon: [name], MD;  Location: [facility];  Service: Open Heart Surgery;  Laterality: N/A;  . TONSILLECTOMY    . triple bypass  11/2017  . VASECTOMY     Social History   Socioeconomic History  . Marital status: Married    Spouse name: Fraser Din  . Number of children: 1  . Years of education: Not on file  . Highest education level: Not on file   Occupational History  . Not on file  Tobacco Use  . Smoking status: Never Smoker  . Smokeless tobacco: Never Used  Vaping Use  . Vaping Use: Never used  Substance and Sexual Activity  . Alcohol use: Yes    Comment: wine 1-2 times a month   . Drug use: No  . Sexual activity: Yes  Other Topics Concern  . 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    Plays golf, remains very active with an interest in politics. Jan 2011 moved to area.    Involved in [employer].    He is a runner - has run [employer] and [employer]. S/p torn meniscus.    Social Determinants of Health   Financial Resource Strain: Low Risk   . Difficulty of Paying Living Expenses: Not hard at all  Food Insecurity: No Food Insecurity  . Worried About Charity fundraiser in the Last Year: Never true  . Ran Out of Food in the Last Year: Never true  Transportation Needs: No Transportation Needs  . Lack of Transportation (Medical): No  . Lack of Transportation (Non-Medical): No  Physical Activity: Sufficiently Active  . Days of Exercise per Week: 7 days  . Minutes of Exercise per Session: 30 min  Stress: No Stress Concern Present  . Feeling of Stress : Not at all  Social Connections: Not on file   Family History  Problem Relation Age of Onset  . Coronary artery disease Father   . Heart attack Father 89  . Heart disease Father   . Stroke Mother   . Colon cancer Neg Hx   . Esophageal cancer Neg Hx   . Rectal cancer Neg Hx   . Stomach cancer Neg Hx   . Kidney cancer Neg Hx   . Kidney failure Neg Hx   . Prostate cancer Neg Hx   . Tuberculosis Neg Hx    Allergies  Allergen Reactions  . Ace Inhibitors Swelling and Other (See Comments)    Possible angioedema (upper lip swelling)  . Morphine And Related Swelling     SWELLING REACTION UNSPECIFIED  [severity rated per PMH, 12/11/2017]   Prior to Admission medications   Medication Sig Start Date End Date Taking? Authorizing Provider  amLODipine (NORVASC) 10 MG tablet Take 1 tablet (10 mg total) by mouth daily. 10/14/20 01/12/21  End, Harrell Gave, MD  aspirin EC 81 MG tablet Take 1 tablet (81 mg total) by mouth daily. 01/17/18   End, Harrell Gave, MD  Cholecalciferol (VITAMIN D3) 25 MCG (1000 UT) CAPS Take 1 capsule by mouth daily.    [provider]  docusate sodium (COLACE) 250 MG capsule Takes total 600mg  /day 04/25/19   [name], MD  hydrochlorothiazide (HYDRODIURIL) 25 MG tablet Take 0.5 tablets (12.5 mg total) by mouth daily. 10/14/20 01/12/21  End, Harrell Gave, MD  levothyroxine (EUTHYROX) 50 MCG tablet Take 1 tablet (50 mcg total) by mouth daily before breakfast. 11/28/20   [name], MD  MAGNESIUM PO Take 1 tablet by mouth daily.    [provider]  Multiple Vitamins-Minerals (ZINC PO) Take by mouth daily.    [provider]  nitroGLYCERIN (NITROSTAT) 0.4 MG SL tablet Place 1 tablet (0.4 mg total) under the tongue every 5 (five) minutes as needed for chest pain (Maximum 3 doses.). 12/24/19   End, Harrell Gave, MD  Omega-3 Fatty Acids (OMEGA 3 PO) Take by mouth daily.    [provider]  ondansetron (ZOFRAN-ODT) 4 MG disintegrating tablet Take 1 tablet (4 mg total) by mouth every 8 (eight) hours as needed for nausea or vomiting. 04/20/20   [name], MD  polyethylene glycol powder (GLYCOLAX/MIRALAX) 17 GM/SCOOP powder Take 17 g by mouth daily. Patient taking differently: Take 17 g by mouth daily. As needed 05/01/20   [name], MD  Potassium 99 MG TABS Take 1 tablet by mouth daily.    [provider]  rosuvastatin (CRESTOR) 20 MG tablet Take 1 tablet by mouth once daily 01/24/21   End, Harrell Gave, MD  sertraline (ZOLOFT) 25 MG tablet Take 1 tablet (25 mg total) by mouth daily. 11/26/20    [name], MD  tamsulosin (FLOMAX) 0.4 MG CAPS capsule Take 1 capsule (0.4 mg total) by mouth daily after supper. 11/26/20   [name], MD  VITAMIN A PO Take 2,400 mcg by mouth daily.     [provider]  vitamin E 400 UNIT capsule Take 400 Units by mouth daily.    [provider]  zaleplon (SONATA) 10 MG capsule Take 1 capsule (10 mg total) by mouth at bedtime as needed for sleep. 10/24/19   [name], MD  Zinc 50 MG CAPS Take 50 mg by mouth daily.    [provider]     Positive ROS: Otherwise negative  All other systems have been reviewed and were otherwise negative with the exception of those mentioned in the HPI and as above.  Physical Exam: Constitutional: Alert, well-appearing, no acute distress Ears: External ears without lesions or tenderness. Ear canals with wax buildup in both ear canals that was cleaned with a curette.  The TMs were clear bilaterally.  On hearing screening with the tuning forks he has very poor hearing in both ears. Nasal: External nose without lesions. Septum with minimal deformity.. Clear nasal passages Oral: Lips and gums without lesions. Tongue and palate mucosa without lesions. Posterior oropharynx clear.  Bucca mucosa again reveals bilateral inflammatory changes but no ulceration and no real induration on palpation. Neck: No palpable adenopathy or masses Respiratory: Breathing comfortably  Skin: No facial/neck lesions or rash noted.  Cerumen impaction removal  Date/Time: 01/26/2021 3:01 PM Performed by: Rozetta Nunnery, MD Authorized by: Rozetta Nunnery, MD   Consent:    Consent obtained:  Verbal   Consent given by:  Patient   Risks discussed:  Pain and bleeding Procedure details:    Location:  L ear and R ear   Procedure type: curette   Post-procedure details:    Inspection:  TM intact and canal normal   Hearing quality:  Improved   Patient tolerance of procedure:  Tolerated well, no  immediate complications Comments:     TMs are clear bilaterally.    Assessment: Chronic buccal mucosal inflammatory changes. Severe bilateral sensorineural hearing loss History of a T1N1 squamous cell carcinoma of the right tonsil treated in 2011  Plan: Briefly discussed with him concerning his hearing problems with follow-up with his audiologist to see if the hearing aids can be adjusted any better.  Could consider cochlear implants if hearing aids are not ample. Concerning the chronic bucca mucosa changes he will follow-up if any of this worsens or becomes more sore. He will follow-up as needed.   Radene Journey, MD

## 2021-04-04 ENCOUNTER — Telehealth: Payer: Self-pay | Admitting: Internal Medicine

## 2021-04-04 NOTE — Telephone Encounter (Signed)
Spoke with the patient. Patient wanted Dr. Saunders Revel updated regarding his increased HR readings. Patient sts that his HR has been in the 40-50 bpm for many years. He was a runner and ran many marathons. Patient called to report that his HR readings have been elevated running in the 70-80's bpm for 2-4 weeks.  He is asymptomatic. He denies palpitations, or feeling like his HR is irregular or racing. He denies any cardiac symptoms. He does monitor his BP regularly and checks his HR with a pulse ox.  I adv the patient that a normal heart rate is 60-100 bpm. Patient sts that he understands that, however this is a change for him and he wants to make sure Dr. Saunders Revel is aware.  Patient was scheduled by scheduling to see Dr. Saunders Revel on 05/19/21 and is dissatisfied with how far out the appt is.  Adv the patient that I will fwd the update to Dr. Saunders Revel as requested.  Patient rqst a call or mychart with follow up response.

## 2021-04-04 NOTE — Telephone Encounter (Signed)
STAT if HR is under 50 or over 120 (normal HR is 60-100 beats per minute)  What is your heart rate? Normally in 43s but staying in 70s and 80s  Do you have a log of your heart rate readings (document readings)?   Do you have any other symptoms? No, Wants to discuss if he should make any changes to medications. Please call [phone].

## 2021-04-04 NOTE — Telephone Encounter (Signed)
Please try to work Mr. Stockley in to see me or an APP at his earliest convenience.  Thanks.  [name], MD [facility]

## 2021-04-04 NOTE — Telephone Encounter (Signed)
Patient made aware of Dr. Darnelle Bos response and recommendation.  Patient initially offered an appt on 8/17, pt declined due to conflict.  Patient has been rescheduled to 04/13/21 @ 3:20pm (DOD) with Dr. Saunders Revel.  Patient is aware of the appt date and time. Patient voiced appreciation for the assistance.

## 2021-04-06 ENCOUNTER — Telehealth: Payer: Self-pay | Admitting: *Deleted

## 2021-04-06 ENCOUNTER — Encounter: Payer: Self-pay | Admitting: Family Medicine

## 2021-04-06 ENCOUNTER — Other Ambulatory Visit: Payer: Self-pay

## 2021-04-06 ENCOUNTER — Ambulatory Visit (INDEPENDENT_AMBULATORY_CARE_PROVIDER_SITE_OTHER): Payer: PPO | Admitting: Family Medicine

## 2021-04-06 VITALS — BP 138/92 | HR 87 | Temp 98.0°F | Wt 200.5 lb

## 2021-04-06 DIAGNOSIS — R42 Dizziness and giddiness: Secondary | ICD-10-CM | POA: Diagnosis not present

## 2021-04-06 DIAGNOSIS — R55 Syncope and collapse: Secondary | ICD-10-CM | POA: Diagnosis not present

## 2021-04-06 DIAGNOSIS — I10 Essential (primary) hypertension: Secondary | ICD-10-CM | POA: Diagnosis not present

## 2021-04-06 DIAGNOSIS — I499 Cardiac arrhythmia, unspecified: Secondary | ICD-10-CM | POA: Diagnosis not present

## 2021-04-06 DIAGNOSIS — I4891 Unspecified atrial fibrillation: Secondary | ICD-10-CM | POA: Diagnosis not present

## 2021-04-06 MED ORDER — APIXABAN 5 MG PO TABS: 5.0000 mg | ORAL_TABLET | Freq: Two times a day (BID) | ORAL | 0 refills | Status: DC

## 2021-04-06 NOTE — Telephone Encounter (Signed)
Patient has an appointment scheduled with Dr. Einar Pheasant today 04/06/21 at 11:40 am.

## 2021-04-06 NOTE — Progress Notes (Signed)
Subjective:     Phillip [name] is a 80 y.o. male presenting for Hypertension (States that his pulse has been running high (in 70's-80's). His normal is usually in the 35's. )     Hypertension   #abnormal bp/hr - symptoms started 1 month ago - noticing that his HR has been high - for years has had lower HR 40-50s - has been seeing readings 70-80s recently - hx of triple bypass surgery and had atrial flutter at that time - had a shock and that got better - concern for something out of rhythm with the higher heart rates - seeing Dr. Saunders Revel next week  Home blood pressure running 113-15/60-96  Water intake - in the morning, also drinks glucerna 3 times a day, 44 oz plus some extra water  #syncope - plays golf on Tuesday and Thursday - the next day was putting fertlizer with a hand spreader on the yard - during the heat of the day on 03/30/2021, sweating a lot and not drinking enough  - bending over for a long time and then fell into the flower bed - scrapped knees and forehead  - endorses some dizziness - but no recurrent lightheadedness or dizziness  - did have difficulty getting up after falling forward as he was leaning over the spreader - neighbor helped him up - also had some dry heaves that day w/o emesis   Review of Systems   Social History   Tobacco Use  Smoking Status Never  Smokeless Tobacco Never        Objective:    BP Readings from Last 3 Encounters:  04/06/21 (!) 138/92  11/26/20 132/74  11/10/20 126/62   Wt Readings from Last 3 Encounters:  04/06/21 200 lb 8 oz (90.9 kg)  11/26/20 210 lb 3 oz (95.3 kg)  11/10/20 208 lb (94.3 kg)    BP (!) 138/92   Pulse 87   Temp 98 F (36.7 C) (Temporal)   Wt 200 lb 8 oz (90.9 kg)   SpO2 97%   BMI 25.74 kg/m    Physical Exam Constitutional:      Appearance: Normal appearance. He is not ill-appearing or diaphoretic.  HENT:     Right Ear: External ear normal.     Left Ear: External ear normal.      Nose: Nose normal.  Eyes:     General: No scleral icterus.    Extraocular Movements: Extraocular movements intact.     Conjunctiva/sclera: Conjunctivae normal.  Cardiovascular:     Rate and Rhythm: Normal rate. Rhythm irregular.     Heart sounds: Murmur heard.  Pulmonary:     Effort: Pulmonary effort is normal. No respiratory distress.     Breath sounds: Normal breath sounds. No wheezing.  Musculoskeletal:     Cervical back: Neck supple.  Skin:    General: Skin is warm and dry.  Neurological:     Mental Status: He is alert. Mental status is at baseline.  Psychiatric:        Mood and Affect: Mood normal.    EKG: Atrial fibrillation rate 84. Non specific ST depression      Assessment & Plan:   Problem List Items Addressed This Visit       Cardiovascular and Mediastinum   Essential hypertension    BP at goal. Cont amlodipine 10 mg and HCTZ 12.5 mg      Relevant Medications   apixaban (ELIQUIS) 5 MG TABS tablet   Atrial fibrillation (HCC) -  Primary    New onset. Rate controlled. CHA2DS2VASc score of 4. Discussed candidate for blood thinner and will start Eliquis unless cost prohibitive. Has cardiology f/u next week will also route note to Dr. Saunders Revel as Juluis Rainier      Relevant Medications   apixaban (ELIQUIS) 5 MG TABS tablet   Postural dizziness with presyncope    One time episode. Discussed that this was likely 2/2 to dehydration and bending over for long periods. Encouraged hydration on hot days. No recurrent symptoms.       Relevant Medications   apixaban (ELIQUIS) 5 MG TABS tablet   Other Visit Diagnoses     Irregular heart rate       Relevant Orders   EKG 12-Lead (Completed)        Return if symptoms worsen or fail to improve.  Lesleigh Noe, MD  This visit occurred during the SARS-CoV-2 public health emergency.  Safety protocols were in place, including screening questions prior to the visit, additional usage of staff PPE, and extensive cleaning of exam room  while observing appropriate contact time as indicated for disinfecting solutions.

## 2021-04-06 NOTE — Assessment & Plan Note (Signed)
New onset. Rate controlled. CHA2DS2VASc score of 4. Discussed candidate for blood thinner and will start Eliquis unless cost prohibitive. Has cardiology f/u next week will also route note to Dr. Saunders Revel as Juluis Rainier

## 2021-04-06 NOTE — Assessment & Plan Note (Signed)
BP at goal. Cont amlodipine 10 mg and HCTZ 12.5 mg

## 2021-04-06 NOTE — Patient Instructions (Signed)
Start taking Eliquis - twice daily Monitor for signs of bleeding Keep appointment with Dr. Saunders Revel   Atrial Fibrillation  Atrial fibrillation is a type of irregular or rapid heartbeat (arrhythmia). In atrial fibrillation, the top part of the heart (atria) beats in an irregular pattern. This makes the heart unable to pump bloodnormally and effectively. The goal of treatment is to prevent blood clots from forming, control your heart rate, or restore your heartbeat to a normal rhythm. If this condition is not treated, it can cause serious problems, such as a weakened heart muscle (cardiomyopathy) or a stroke. What are the causes? This condition is often caused by medical conditions that damage the heart's electrical system. These include: High blood pressure (hypertension). This is the most common cause. Certain heart problems or conditions, such as heart failure, coronary artery disease, heart valve problems, or heart surgery. Diabetes. Overactive thyroid (hyperthyroidism). Obesity. Chronic kidney disease. In some cases, the cause of this condition is not known. What increases the risk? This condition is more likely to develop in: Older people. People who smoke. Athletes who do endurance exercise. People who have a family history of atrial fibrillation. Men. People who use drugs. People who drink a lot of alcohol. People who have lung conditions, such as emphysema, pneumonia, or COPD. People who have obstructive sleep apnea. What are the signs or symptoms? Symptoms of this condition include: A feeling that your heart is racing or beating irregularly. Discomfort or pain in your chest. Shortness of breath. Sudden light-headedness or weakness. Tiring easily during exercise or activity. Fatigue. Syncope (fainting). Sweating. In some cases, there are no symptoms. How is this diagnosed? Your health care provider may detect atrial fibrillation when taking your pulse. If detected, this  condition may be diagnosed with: An electrocardiogram (ECG) to check electrical signals of the heart. An ambulatory cardiac monitor to record your heart's activity for a few days. A transthoracic echocardiogram (TTE) to create pictures of your heart. A transesophageal echocardiogram (TEE) to create even closer pictures of your heart. A stress test to check your blood supply while you exercise. Imaging tests, such as a CT scan or chest X-ray. Blood tests. How is this treated? Treatment depends on underlying conditions and how you feel when you experience atrial fibrillation. This condition may be treated with: Medicines to prevent blood clots or to treat heart rate or heart rhythm problems. Electrical cardioversion to reset the heart's rhythm. A pacemaker to correct abnormal heart rhythm. Ablation to remove the heart tissue that sends abnormal signals. Left atrial appendage closure to seal the area where blood clots can form. In some cases, underlying conditions will be treated. Follow these instructions at home: Medicines Take over-the counter and prescription medicines only as told by your health care provider. Do not take any new medicines without talking to your health care provider. If you are taking blood thinners: Talk with your health care provider before you take any medicines that contain aspirin or NSAIDs, such as ibuprofen. These medicines increase your risk for dangerous bleeding. Take your medicine exactly as told, at the same time every day. Avoid activities that could cause injury or bruising, and follow instructions about how to prevent falls. Wear a medical alert bracelet or carry a card that lists what medicines you take. Lifestyle     Do not use any products that contain nicotine or tobacco, such as cigarettes, e-cigarettes, and chewing tobacco. If you need help quitting, ask your health care provider. Eat heart-healthy foods. Talk  with a dietitian to make an eating  plan that is right for you. Exercise regularly as told by your health care provider. Do not drink alcohol. Lose weight if you are overweight. Do not use drugs, including cannabis. General instructions If you have obstructive sleep apnea, manage your condition as told by your health care provider. Do not use diet pills unless your health care provider approves. Diet pills can make heart problems worse. Keep all follow-up visits as told by your health care provider. This is important. Contact a health care provider if you: Notice a change in the rate, rhythm, or strength of your heartbeat. Are taking a blood thinner and you notice more bruising. Tire more easily when you exercise or do heavy work. Have a sudden change in weight. Get help right away if you have:  Chest pain, abdominal pain, sweating, or weakness. Trouble breathing. Side effects of blood thinners, such as blood in your vomit, stool, or urine, or bleeding that cannot stop. Any symptoms of a stroke. "BE FAST" is an easy way to remember the main warning signs of a stroke: B - Balance. Signs are dizziness, sudden trouble walking, or loss of balance. E - Eyes. Signs are trouble seeing or a sudden change in vision. F - Face. Signs are sudden weakness or numbness of the face, or the face or eyelid drooping on one side. A - Arms. Signs are weakness or numbness in an arm. This happens suddenly and usually on one side of the body. S - Speech. Signs are sudden trouble speaking, slurred speech, or trouble understanding what people say. T - Time. Time to call emergency services. Write down what time symptoms started. Other signs of a stroke, such as: A sudden, severe headache with no known cause. Nausea or vomiting. Seizure. These symptoms may represent a serious problem that is an emergency. Do not wait to see if the symptoms will go away. Get medical help right away. Call your local emergency services (911 in the [place] Do not drive  yourself to the hospital. Summary Atrial fibrillation is a type of irregular or rapid heartbeat (arrhythmia). Symptoms include a feeling that your heart is beating fast or irregularly. You may be given medicines to prevent blood clots or to treat heart rate or heart rhythm problems. Get help right away if you have signs or symptoms of a stroke. Get help right away if you cannot catch your breath or have chest pain or pressure. This information is not intended to replace advice given to you by your health care provider. Make sure you discuss any questions you have with your healthcare provider. Document Revised: 01/29/2019 Document Reviewed: 01/29/2019 Elsevier Patient Education  [place].

## 2021-04-06 NOTE — Assessment & Plan Note (Signed)
One time episode. Discussed that this was likely 2/2 to dehydration and bending over for long periods. Encouraged hydration on hot days. No recurrent symptoms.

## 2021-04-06 NOTE — Telephone Encounter (Signed)
PLEASE NOTE: All timestamps contained within this report are represented as [place] Standard Time. CONFIDENTIALTY NOTICE: This fax transmission is intended only for the addressee. It contains information that is legally privileged, confidential or otherwise protected from use or disclosure. If you are not the intended recipient, you are strictly prohibited from reviewing, disclosing, copying using or disseminating any of this information or taking any action in reliance on or regarding this information. If you have received this fax in error, please notify us immediately by telephone so that we can arrange for its return to [place]. Phone: [phone], Toll-Free: [phone], Fax: [phone] Page: 1 of 2 Call Id: [id number] [facility] - Client TELEPHONE ADVICE RECORD [facility] Patient Name: Phillip [name] Gender: Male DOB: 08-21-1941 Age: 80 Y 55 M 16 D Return Phone Number: [phone] (Primary) Address: City/ State/ Zip: [place]  [address] Client [facility] - Client Client Site [facility] - Day Physician [name] - MD Contact Type Call Who Is Calling Patient / Member / Family / Caregiver Call Type Triage / Clinical Caller Name Keyaan Koppel Relationship To Patient Spouse Return Phone Number [phone] (Primary) Chief Complaint BLOOD PRESSURE HIGH - Diastolic (bottom number) [phone] or greater Reason for Call Symptomatic / Request for [place] from [place] states PT bp was running in the 170's and his pulse is inthe 70's and last week he fainted from this occurrence. Wife Patsy is on the phone. Translation No Nurse Assessment Nurse: Humfleet, RN, Estill Bamberg Date/Time (Eastern Time): 04/05/2021 4:25:01 PM Confirm and document reason for call. If symptomatic, describe symptoms. ---caller states her husband's bp is 150/92 and HR 78 and that was 30 mins  ago has bp med due at 8pm. this morning at 7am it was 174/111 HR 76 and took med. sees heart doctor next week he has had instances of it being 159 sys and then dropping to 109 sys last Wednesday he got dizzy and fell down on his knees. he could not get up. his neighbors saw him and they were able to help him up. also states his was a runner for years and HR is usually in the 76s and now HR is now in the 70-80 range Does the patient have any new or worsening symptoms? ---Yes Will a triage be completed? ---Yes Related visit to physician within the last 2 weeks? ---No Does the PT have any chronic conditions? (i.e. diabetes, asthma, this includes High risk factors for pregnancy, etc.) ---Yes List chronic conditions. ---HTN, CABG x3 takes amlodipine '10mg'$  HS takes 1/2 of '25mg'$  hydrochlorothiazide Is this a behavioral health or substance abuse call? ---No PLEASE NOTE: All timestamps contained within this report are represented as [place] Standard Time. CONFIDENTIALTY NOTICE: This fax transmission is intended only for the addressee. It contains information that is legally privileged, confidential or otherwise protected from use or disclosure. If you are not the intended recipient, you are strictly prohibited from reviewing, disclosing, copying using or disseminating any of this information or taking any action in reliance on or regarding this information. If you have received this fax in error, please notify us immediately by telephone so that we can arrange for its return to [place]. Phone: [phone], Toll-Free: [phone], Fax: [phone] Page: 2 of 2 Call Id: [id number] Guidelines Guideline Title Affirmed Question Affirmed Notes Nurse Date/Time Eilene Ghazi Time) Blood Pressure - High Systolic BP >= 99991111 OR Diastolic >= [id number] Humfleet, RN, Estill Bamberg 04/05/2021 4:25:25  PM Disp. Time Eilene Ghazi Time) Disposition Final User 04/05/2021 4:22:52 PM Send to Urgent Kizzie Furnish 04/05/2021  4:46:13 PM See PCP within 24 Hours Yes Humfleet, RN, Shelly Coss Disagree/Comply Comply Caller Understands Yes PreDisposition Call Doctor Care Advice Given Per Guideline SEE PCP WITHIN 24 HOURS: * IF OFFICE WILL BE OPEN: You need to be examined within the next 24 hours. Call your doctor (or NP/PA) when the office opens and make an appointment. CARE ADVICE given per High Blood Pressure (Adult) guideline. * You become worse * Chest pain or difficulty breathing occurs CALL BACK IF: * Weakness or numbness of the face, arm or leg on one side of the body occurs Comments User: Rozelle Logan, RN Date/Time Eilene Ghazi Time): 04/05/2021 4:47:08 PM transferred patient back to office line for appointment tomorrow Referrals REFERRED TO PCP OFFICE

## 2021-04-06 NOTE — Telephone Encounter (Signed)
See note from today. Pt with new Afib

## 2021-04-13 ENCOUNTER — Encounter: Payer: Self-pay | Admitting: Internal Medicine

## 2021-04-13 ENCOUNTER — Ambulatory Visit: Payer: PPO | Admitting: Internal Medicine

## 2021-04-13 ENCOUNTER — Other Ambulatory Visit: Payer: Self-pay

## 2021-04-13 VITALS — BP 140/70 | HR 81 | Ht 74.0 in | Wt 199.5 lb

## 2021-04-13 DIAGNOSIS — I2510 Atherosclerotic heart disease of native coronary artery without angina pectoris: Secondary | ICD-10-CM

## 2021-04-13 DIAGNOSIS — I4819 Other persistent atrial fibrillation: Secondary | ICD-10-CM

## 2021-04-13 DIAGNOSIS — E785 Hyperlipidemia, unspecified: Secondary | ICD-10-CM

## 2021-04-13 DIAGNOSIS — I10 Essential (primary) hypertension: Secondary | ICD-10-CM | POA: Diagnosis not present

## 2021-04-13 DIAGNOSIS — I255 Ischemic cardiomyopathy: Secondary | ICD-10-CM | POA: Diagnosis not present

## 2021-04-13 MED ORDER — METOPROLOL SUCCINATE ER 25 MG PO TB24: 25.0000 mg | ORAL_TABLET | Freq: Every day | ORAL | 3 refills | Status: DC

## 2021-04-13 NOTE — Progress Notes (Signed)
Follow-up Outpatient Visit Date: 04/13/2021  Primary Care Provider: [name], MD [place] [address]  Chief Complaint: Atrial fibrillation  HPI:  Phillip [name] is a 80 y.o. male with history of coronary artery disease status post CABG in [id number], chronic systolic heart failure secondary to ischemic cardiomyopathy, atrial flutter, hypertension, and hyperlipidemia, who presents for evaluation of newly diagnosed atrial fibrillation.  He was last seen in our office in March by Marrianne Mood, PA, at which time he was feeling well with improved blood pressure control compared with our prior visit in February.  He reached out to our office earlier this month, concerned about heart rates that were elevated above his baseline.  He was seen by Dr. Einar Pheasant last week and diagnosed with atrial fibrillation.  He was started on apixaban and advised to follow-up with [place] as previously scheduled.  Today, Phillip [name] is doing well.  He notes that his resting heart rates at home are still above what he has been used to for many years.  In hindsight, he believes that his heart rate may have risen around April.  He denies frank palpitations as well as chest pain, shortness of breath, lightheadedness, and edema.  Two weeks ago, he fell while bending over to fill his fertilizer spreader.  He did not seem dizzy or off balance but suddenly fell forward into a flower bed.  He was unable to get up on his own due to generalized weakness.  He believes that dehydration may have been playing a role.  He was ultimately able to get up with the assistance of his neighbors and has not had any similar episodes since.  He is tolerating apixaban that was added by Dr. Einar Pheasant well.  He has been monitoring his blood pressures regularly at home and notes that they are typically well controlled though some mildly elevated readings have been noted at  times.  --------------------------------------------------------------------------------------------------  Past Medical History:  Diagnosis Date   Anxiety    Atrial flutter ([place]) 01/07/2018   Post-op after CABG   Basal cell carcinoma 09/12/2018   Left forehead above lateral brow. Nodular pattern   Cancer ([place])    Coronary artery disease 2003   a. nonobstructive CAD by cath in 2003 and 2005; b.  Status post three-vessel CABG on 12/12/2017 with LIMA to LAD, sequential reverse SVG to OM1 and distal LCx   COVID-19 03/2020   Diverticulosis    Hearing loss    bilateral   Heart disease    Heart murmur    History of benign prostatic hypertrophy    History of radiation therapy 01/31/10-03/22/10   right tonsil/right neck node 7000 cGy 35 sessions, high risk lymph node volume 5940 cGy 35 sessions, low risk lymph node vol 5600 cGy 35 sessions   HPV in male    positive   HTN (hypertension)    Hyperlipidemia    diet controlled- taking medication d/t ensure drinking   Hypothyroid 01/16/2013   Ischemic cardiomyopathy    a. 10/2017: echo showing reduced EF of 40-45%, HK of the anterior, anteroseptal and apical myocardium with Grade 1 DD.    Neck pain    Squamous cell carcinoma    right tonsil- 2011   Squamous cell carcinoma of skin 09/02/2019   Crown scalp. WD SCC   Squamous cell carcinoma of skin 09/02/2019   Left distal lateral deltoid. SCCis arising in [place]   Thrombocytopenia ([place]) 02/02/2012   unclear etiology   Tinnitus  Tubular adenoma of colon    Past Surgical History:  Procedure Laterality Date   artoscopic knee     CARDIAC CATHETERIZATION  2003, 2005   40% blockage, two 30% blockages treated medically Ron Parker)   CARDIOVERSION N/A 02/12/2018   Procedure: CARDIOVERSION;  Surgeon: [name], MD;  Location: [facility];  Service: Cardiovascular;  Laterality: N/A;   COLONOSCOPY  03/2015   TA x3, HP, melanosis coli, severe diverticulosis, rpt 3 yrs (Pyrtle)   COLONOSCOPY  01/2019    fair prep, diverticulosis, f/u PRN (Pyrtle)   CORONARY ARTERY BYPASS GRAFT N/A 12/12/2017   Procedure: CORONARY ARTERY BYPASS GRAFTING (CABG) x3 using the right greater saphenous vein harvested endoscopically and the left internal mammary artery. LIMA to LAD, SEQ SVG to OM1 & OM2;  Surgeon: [name], MD;  Location: [facility];  Service: Open Heart Surgery;  Laterality: N/A;   DENTAL SURGERY     extractions   IR THORACENTESIS ASP PLEURAL SPACE W/IMG GUIDE  01/17/2018   KNEE ARTHROSCOPY Left    LEFT HEART CATH AND CORONARY ANGIOGRAPHY N/A 12/10/2017   Procedure: LEFT HEART CATH AND CORONARY ANGIOGRAPHY;  Surgeon: [name], MD;  Location: [facility];  Service: Cardiovascular;  Laterality: N/A;   PEG PLACEMENT  02/2010   TEE WITHOUT CARDIOVERSION N/A 12/12/2017   Procedure: TRANSESOPHAGEAL ECHOCARDIOGRAM (TEE);  Surgeon: [name], MD;  Location: [facility];  Service: Open Heart Surgery;  Laterality: N/A;   TONSILLECTOMY     triple bypass  11/2017   VASECTOMY       Current Meds  Medication Sig   amLODipine (NORVASC) 10 MG tablet Take 1 tablet (10 mg total) by mouth daily.   apixaban (ELIQUIS) 5 MG TABS tablet Take 1 tablet (5 mg total) by mouth 2 (two) times daily.   Cholecalciferol (VITAMIN D3) 25 MCG (1000 UT) CAPS Take 1 capsule by mouth daily.   docusate sodium (COLACE) 250 MG capsule Takes total '600mg'$  /day   hydrochlorothiazide (HYDRODIURIL) 25 MG tablet Take 0.5 tablets (12.5 mg total) by mouth daily.   levothyroxine (EUTHYROX) 50 MCG tablet Take 1 tablet (50 mcg total) by mouth daily before breakfast.   MAGNESIUM PO Take 1 tablet by mouth daily.   Multiple Vitamins-Minerals (ZINC PO) Take by mouth daily.   nitroGLYCERIN (NITROSTAT) 0.4 MG SL tablet Place 1 tablet (0.4 mg total) under the tongue every 5 (five) minutes as needed for chest pain (Maximum 3 doses.).   Omega-3 Fatty Acids (OMEGA 3 PO) Take by mouth daily.   ondansetron (ZOFRAN-ODT) 4 MG disintegrating  tablet Take 1 tablet (4 mg total) by mouth every 8 (eight) hours as needed for nausea or vomiting.   polyethylene glycol powder (GLYCOLAX/MIRALAX) 17 GM/SCOOP powder Take 17 g by mouth daily. (Patient taking differently: Take 17 g by mouth daily. As needed)   Potassium 99 MG TABS Take 1 tablet by mouth daily.   rosuvastatin (CRESTOR) 20 MG tablet Take 1 tablet by mouth once daily   sertraline (ZOLOFT) 25 MG tablet Take 1 tablet (25 mg total) by mouth daily.   VITAMIN A PO Take 2,400 mcg by mouth daily.    vitamin E 400 UNIT capsule Take 400 Units by mouth daily.   zaleplon (SONATA) 10 MG capsule Take 1 capsule (10 mg total) by mouth at bedtime as needed for sleep.   Zinc 50 MG CAPS Take 50 mg by mouth daily.    Allergies: Ace inhibitors and Morphine and related  Social History  Tobacco Use   Smoking status: Never   Smokeless tobacco: Never  Vaping Use   Vaping Use: Never used  Substance Use Topics   Alcohol use: Yes    Comment: wine 1-2 times a month    Drug use: No    Family History  Problem Relation Age of Onset   Coronary artery disease Father    Heart attack Father 66   Heart disease Father    Stroke Mother    Colon cancer Neg Hx    Esophageal cancer Neg Hx    Rectal cancer Neg Hx    Stomach cancer Neg Hx    Kidney cancer Neg Hx    Kidney failure Neg Hx    Prostate cancer Neg Hx    Tuberculosis Neg Hx     Review of Systems: A 12-system review of systems was performed and was negative except as noted in the HPI.  --------------------------------------------------------------------------------------------------  Physical Exam: BP 140/70 (BP Location: Left Arm, Patient Position: Sitting, Cuff Size: Normal)   Pulse 81   Ht '6\' 2"'$  (1.88 m)   Wt 199 lb 8 oz (90.5 kg)   SpO2 96%   BMI 25.61 kg/m   General:  NAD. Neck: No JVD or HJR. Lungs: Clear to auscultation bilaterally without wheezes or crackles. Heart: Irregularly irregular without murmurs, rubs, or  gallops. Abdomen: Soft, nontender, nondistended. Extremities: Trace right lower extremity edema.  Right knee scab present.  EKG: Atrial fibrillation with left axis deviation and nonspecific intraventricular conduction delay.  Compared with prior tracing from 04/06/2021, PVCs versus aberrancy are no longer present.  Lab Results  Component Value Date   WBC 7.4 04/28/2020   HGB 15.8 04/28/2020   HCT 46.4 04/28/2020   MCV 97.2 04/28/2020   PLT 216.0 04/28/2020    Lab Results  Component Value Date   NA 139 11/10/2020   K 4.2 11/10/2020   CL 104 11/10/2020   CO2 27 11/10/2020   BUN 34 (H) 11/10/2020   CREATININE 0.86 11/10/2020   GLUCOSE 117 (H) 11/10/2020   ALT 24 04/28/2020    Lab Results  Component Value Date   CHOL 96 04/28/2020   HDL 36.20 (L) 04/28/2020   LDLCALC 45 04/28/2020   LDLDIRECT 101.0 10/13/2014   TRIG 74.0 04/28/2020   CHOLHDL 3 04/28/2020    --------------------------------------------------------------------------------------------------  ASSESSMENT AND PLAN: Persistent atrial fibrillation: Based on Phillip [name] home heart rate measurements, it is likely that his atrial fibrillation began in the spring.  He seems largely asymptomatic, though I wonder if his recent fall with generalized weakness may have been a manifestation of atrial fibrillation with rapid ventricular response.  His ventricular rates are fairly well controlled today, though resting heart rates remain well above his baseline.  We have agreed to add metoprolol succinate 25 mg daily and continue anticoagulation with apixaban 5 mg twice daily.  I will check a CBC, CMP, magnesium, and TSH today.  We will arrange for an echocardiogram to assess for new structural abnormalities at Phillip [name] convenience.  We will also plan for cardioversion when he has completed at least 4 weeks of uninterrupted anticoagulation (scheduled for 05/10/2021).  Coronary artery disease: Phillip [name] does not report angina,  though he did not have typical symptoms leading up to his diagnosis of CAD and subsequent CABG.  I would have a low threshold for ischemia evaluation after completion of aforementioned echocardiogram to ensure that recent development of atrial fibrillation is not related to worsening ischemic heart disease.  Continue secondary prevention with current medications, including apixaban and lieu of aspirin.  Ischemic cardiomyopathy: LVEF noted to be mildly reduced on most recent echo.  GDMT has been limited due to baseline bradycardia as well as angioedema associated with ACE inhibitors.  As outlined above, we will add low-dose metoprolol succinate and repeat an echocardiogram.  Hypertension: Blood pressure mildly elevated today typically better at home though readings have been somewhat labile.  In order to minimize risk for hypotension with addition of metoprolol, we will have him hold HCTZ.  Hyperlipidemia: Lipids well controlled with LDL less than 70 on last check in 04/2020.  Continue current dose of rosuvastatin.  Shared Decision Making/Informed Consent The risks (stroke, cardiac arrhythmias rarely resulting in the need for a temporary or permanent pacemaker, skin irritation or burns and complications associated with conscious sedation including aspiration, arrhythmia, respiratory failure and death), benefits (restoration of normal sinus rhythm) and alternatives of a direct current cardioversion were explained in detail to Phillip [name] and he agrees to proceed.   Follow-up: Return to clinic 1 to 2 weeks after cardioversion.  [name], MD 04/13/2021 3:41 PM

## 2021-04-13 NOTE — H&P (View-Only) (Signed)
Follow-up Outpatient Visit Date: 04/13/2021  Primary Care Provider: [name], MD [place] [address]  Chief Complaint: Atrial fibrillation  HPI:  Phillip [name] is a 80 y.o. male with history of coronary artery disease status post CABG in [id number], chronic systolic heart failure secondary to ischemic cardiomyopathy, atrial flutter, hypertension, and hyperlipidemia, who presents for evaluation of newly diagnosed atrial fibrillation.  He was last seen in our office in March by Marrianne Mood, PA, at which time he was feeling well with improved blood pressure control compared with our prior visit in February.  He reached out to our office earlier this month, concerned about heart rates that were elevated above his baseline.  He was seen by Dr. Einar Pheasant last week and diagnosed with atrial fibrillation.  He was started on apixaban and advised to follow-up with [place] as previously scheduled.  Today, Phillip [name] is doing well.  He notes that his resting heart rates at home are still above what he has been used to for many years.  In hindsight, he believes that his heart rate may have risen around April.  He denies frank palpitations as well as chest pain, shortness of breath, lightheadedness, and edema.  Two weeks ago, he fell while bending over to fill his fertilizer spreader.  He did not seem dizzy or off balance but suddenly fell forward into a flower bed.  He was unable to get up on his own due to generalized weakness.  He believes that dehydration may have been playing a role.  He was ultimately able to get up with the assistance of his neighbors and has not had any similar episodes since.  He is tolerating apixaban that was added by Dr. Einar Pheasant well.  He has been monitoring his blood pressures regularly at home and notes that they are typically well controlled though some mildly elevated readings have been noted at  times.  --------------------------------------------------------------------------------------------------  Past Medical History:  Diagnosis Date   Anxiety    Atrial flutter ([place]) 01/07/2018   Post-op after CABG   Basal cell carcinoma 09/12/2018   Left forehead above lateral brow. Nodular pattern   Cancer ([place])    Coronary artery disease 2003   a. nonobstructive CAD by cath in 2003 and 2005; b.  Status post three-vessel CABG on 12/12/2017 with LIMA to LAD, sequential reverse SVG to OM1 and distal LCx   COVID-19 03/2020   Diverticulosis    Hearing loss    bilateral   Heart disease    Heart murmur    History of benign prostatic hypertrophy    History of radiation therapy 01/31/10-03/22/10   right tonsil/right neck node 7000 cGy 35 sessions, high risk lymph node volume 5940 cGy 35 sessions, low risk lymph node vol 5600 cGy 35 sessions   HPV in male    positive   HTN (hypertension)    Hyperlipidemia    diet controlled- taking medication d/t ensure drinking   Hypothyroid 01/16/2013   Ischemic cardiomyopathy    a. 10/2017: echo showing reduced EF of 40-45%, HK of the anterior, anteroseptal and apical myocardium with Grade 1 DD.    Neck pain    Squamous cell carcinoma    right tonsil- 2011   Squamous cell carcinoma of skin 09/02/2019   Crown scalp. WD SCC   Squamous cell carcinoma of skin 09/02/2019   Left distal lateral deltoid. SCCis arising in [place]   Thrombocytopenia ([place]) 02/02/2012   unclear etiology   Tinnitus  Tubular adenoma of colon    Past Surgical History:  Procedure Laterality Date   artoscopic knee     CARDIAC CATHETERIZATION  2003, 2005   40% blockage, two 30% blockages treated medically Ron Parker)   CARDIOVERSION N/A 02/12/2018   Procedure: CARDIOVERSION;  Surgeon: [name], MD;  Location: [facility];  Service: Cardiovascular;  Laterality: N/A;   COLONOSCOPY  03/2015   TA x3, HP, melanosis coli, severe diverticulosis, rpt 3 yrs (Pyrtle)   COLONOSCOPY  01/2019    fair prep, diverticulosis, f/u PRN (Pyrtle)   CORONARY ARTERY BYPASS GRAFT N/A 12/12/2017   Procedure: CORONARY ARTERY BYPASS GRAFTING (CABG) x3 using the right greater saphenous vein harvested endoscopically and the left internal mammary artery. LIMA to LAD, SEQ SVG to OM1 & OM2;  Surgeon: [name], MD;  Location: [facility];  Service: Open Heart Surgery;  Laterality: N/A;   DENTAL SURGERY     extractions   IR THORACENTESIS ASP PLEURAL SPACE W/IMG GUIDE  01/17/2018   KNEE ARTHROSCOPY Left    LEFT HEART CATH AND CORONARY ANGIOGRAPHY N/A 12/10/2017   Procedure: LEFT HEART CATH AND CORONARY ANGIOGRAPHY;  Surgeon: [name], MD;  Location: [facility];  Service: Cardiovascular;  Laterality: N/A;   PEG PLACEMENT  02/2010   TEE WITHOUT CARDIOVERSION N/A 12/12/2017   Procedure: TRANSESOPHAGEAL ECHOCARDIOGRAM (TEE);  Surgeon: [name], MD;  Location: [facility];  Service: Open Heart Surgery;  Laterality: N/A;   TONSILLECTOMY     triple bypass  11/2017   VASECTOMY       Current Meds  Medication Sig   amLODipine (NORVASC) 10 MG tablet Take 1 tablet (10 mg total) by mouth daily.   apixaban (ELIQUIS) 5 MG TABS tablet Take 1 tablet (5 mg total) by mouth 2 (two) times daily.   Cholecalciferol (VITAMIN D3) 25 MCG (1000 UT) CAPS Take 1 capsule by mouth daily.   docusate sodium (COLACE) 250 MG capsule Takes total '600mg'$  /day   hydrochlorothiazide (HYDRODIURIL) 25 MG tablet Take 0.5 tablets (12.5 mg total) by mouth daily.   levothyroxine (EUTHYROX) 50 MCG tablet Take 1 tablet (50 mcg total) by mouth daily before breakfast.   MAGNESIUM PO Take 1 tablet by mouth daily.   Multiple Vitamins-Minerals (ZINC PO) Take by mouth daily.   nitroGLYCERIN (NITROSTAT) 0.4 MG SL tablet Place 1 tablet (0.4 mg total) under the tongue every 5 (five) minutes as needed for chest pain (Maximum 3 doses.).   Omega-3 Fatty Acids (OMEGA 3 PO) Take by mouth daily.   ondansetron (ZOFRAN-ODT) 4 MG disintegrating  tablet Take 1 tablet (4 mg total) by mouth every 8 (eight) hours as needed for nausea or vomiting.   polyethylene glycol powder (GLYCOLAX/MIRALAX) 17 GM/SCOOP powder Take 17 g by mouth daily. (Patient taking differently: Take 17 g by mouth daily. As needed)   Potassium 99 MG TABS Take 1 tablet by mouth daily.   rosuvastatin (CRESTOR) 20 MG tablet Take 1 tablet by mouth once daily   sertraline (ZOLOFT) 25 MG tablet Take 1 tablet (25 mg total) by mouth daily.   VITAMIN A PO Take 2,400 mcg by mouth daily.    vitamin E 400 UNIT capsule Take 400 Units by mouth daily.   zaleplon (SONATA) 10 MG capsule Take 1 capsule (10 mg total) by mouth at bedtime as needed for sleep.   Zinc 50 MG CAPS Take 50 mg by mouth daily.    Allergies: Ace inhibitors and Morphine and related  Social History  Tobacco Use   Smoking status: Never   Smokeless tobacco: Never  Vaping Use   Vaping Use: Never used  Substance Use Topics   Alcohol use: Yes    Comment: wine 1-2 times a month    Drug use: No    Family History  Problem Relation Age of Onset   Coronary artery disease Father    Heart attack Father 77   Heart disease Father    Stroke Mother    Colon cancer Neg Hx    Esophageal cancer Neg Hx    Rectal cancer Neg Hx    Stomach cancer Neg Hx    Kidney cancer Neg Hx    Kidney failure Neg Hx    Prostate cancer Neg Hx    Tuberculosis Neg Hx     Review of Systems: A 12-system review of systems was performed and was negative except as noted in the HPI.  --------------------------------------------------------------------------------------------------  Physical Exam: BP 140/70 (BP Location: Left Arm, Patient Position: Sitting, Cuff Size: Normal)   Pulse 81   Ht '6\' 2"'$  (1.88 m)   Wt 199 lb 8 oz (90.5 kg)   SpO2 96%   BMI 25.61 kg/m   General:  NAD. Neck: No JVD or HJR. Lungs: Clear to auscultation bilaterally without wheezes or crackles. Heart: Irregularly irregular without murmurs, rubs, or  gallops. Abdomen: Soft, nontender, nondistended. Extremities: Trace right lower extremity edema.  Right knee scab present.  EKG: Atrial fibrillation with left axis deviation and nonspecific intraventricular conduction delay.  Compared with prior tracing from 04/06/2021, PVCs versus aberrancy are no longer present.  Lab Results  Component Value Date   WBC 7.4 04/28/2020   HGB 15.8 04/28/2020   HCT 46.4 04/28/2020   MCV 97.2 04/28/2020   PLT 216.0 04/28/2020    Lab Results  Component Value Date   NA 139 11/10/2020   K 4.2 11/10/2020   CL 104 11/10/2020   CO2 27 11/10/2020   BUN 34 (H) 11/10/2020   CREATININE 0.86 11/10/2020   GLUCOSE 117 (H) 11/10/2020   ALT 24 04/28/2020    Lab Results  Component Value Date   CHOL 96 04/28/2020   HDL 36.20 (L) 04/28/2020   LDLCALC 45 04/28/2020   LDLDIRECT 101.0 10/13/2014   TRIG 74.0 04/28/2020   CHOLHDL 3 04/28/2020    --------------------------------------------------------------------------------------------------  ASSESSMENT AND PLAN: Persistent atrial fibrillation: Based on Phillip [name] home heart rate measurements, it is likely that his atrial fibrillation began in the spring.  He seems largely asymptomatic, though I wonder if his recent fall with generalized weakness may have been a manifestation of atrial fibrillation with rapid ventricular response.  His ventricular rates are fairly well controlled today, though resting heart rates remain well above his baseline.  We have agreed to add metoprolol succinate 25 mg daily and continue anticoagulation with apixaban 5 mg twice daily.  I will check a CBC, CMP, magnesium, and TSH today.  We will arrange for an echocardiogram to assess for new structural abnormalities at Phillip [name] convenience.  We will also plan for cardioversion when he has completed at least 4 weeks of uninterrupted anticoagulation (scheduled for 05/10/2021).  Coronary artery disease: Phillip [name] does not report angina,  though he did not have typical symptoms leading up to his diagnosis of CAD and subsequent CABG.  I would have a low threshold for ischemia evaluation after completion of aforementioned echocardiogram to ensure that recent development of atrial fibrillation is not related to worsening ischemic heart disease.  Continue secondary prevention with current medications, including apixaban and lieu of aspirin.  Ischemic cardiomyopathy: LVEF noted to be mildly reduced on most recent echo.  GDMT has been limited due to baseline bradycardia as well as angioedema associated with ACE inhibitors.  As outlined above, we will add low-dose metoprolol succinate and repeat an echocardiogram.  Hypertension: Blood pressure mildly elevated today typically better at home though readings have been somewhat labile.  In order to minimize risk for hypotension with addition of metoprolol, we will have him hold HCTZ.  Hyperlipidemia: Lipids well controlled with LDL less than 70 on last check in 04/2020.  Continue current dose of rosuvastatin.  Shared Decision Making/Informed Consent The risks (stroke, cardiac arrhythmias rarely resulting in the need for a temporary or permanent pacemaker, skin irritation or burns and complications associated with conscious sedation including aspiration, arrhythmia, respiratory failure and death), benefits (restoration of normal sinus rhythm) and alternatives of a direct current cardioversion were explained in detail to Phillip [name] and he agrees to proceed.   Follow-up: Return to clinic 1 to 2 weeks after cardioversion.  [name], MD 04/13/2021 3:41 PM

## 2021-04-13 NOTE — Patient Instructions (Addendum)
Medication Instructions:   Your physician has recommended you make the following change in your medication:   STOP Hydrochlorothiazide (HCTZ)  START Metoprolol Succinate 25 mg daily   *If you need a refill on your cardiac medications before your next appointment, please call your pharmacy*   Lab Work:  TODAY: CMET, CBC, Magnesium, TSH  If you have labs (blood work) drawn today and your tests are completely normal, you will receive your results only by: [place] (if you have MyChart) OR A paper copy in the mail If you have any lab test that is abnormal or we need to change your treatment, we will call you to review the results.   Testing/Procedures:  You are scheduled for a Cardioversion on Tuesday 05/10/21 with Dr.End.  Please arrive at the [facility] at 6:30a.m. on the day of your procedure.  DIET INSTRUCTIONS:  Nothing to eat or drink after midnight except your medications with a sip of water.         Labs: Completed today. No further labs required.   Medications:  YOU MAY TAKE ALL of your remaining medications with a small amount of water.  Must have a responsible person to drive you home.  Bring a current list of your medications and current insurance cards.    If you have any questions after you get home, please call the office at [phone]   Follow-Up: At [facility], you and your health needs are our priority.  As part of our continuing mission to provide you with exceptional heart care, we have created designated Provider Care Teams.  These Care Teams include your primary Cardiologist (physician) and Advanced Practice Providers (APPs -  Physician Assistants and Nurse Practitioners) who all work together to provide you with the care you need, when you need it.  We recommend signing up for the patient portal called "MyChart".  Sign up information is provided on this After Visit Summary.  MyChart is used to connect with patients for Virtual  Visits (Telemedicine).  Patients are able to view lab/test results, encounter notes, upcoming appointments, etc.  Non-urgent messages can be sent to your provider as well.   To learn more about what you can do with MyChart, go to [email or web address].    Your next appointment:    1 - 2 week(s) AFTER Cardioversion  The format for your next appointment:   In Person  Provider:   You may see [name], MD or one of the following Advanced Practice Providers on your designated Care Team:   Murray Hodgkins, NP Christell Faith, PA-C Marrianne Mood, PA-C Cadence Kathlen Mody, [place] `Electrical Cardioversion Electrical cardioversion is the delivery of a jolt of electricity to restore a normal rhythm to the heart. A rhythm that is too fast or is not regular keeps the heart from pumping well. In this procedure, sticky patches or metal paddlesare placed on the chest to deliver electricity to the heart from a device. This procedure may be done in an emergency if: There is low or no blood pressure as a result of the heart rhythm. Normal rhythm must be restored as fast as possible to protect the brain and heart from further damage. It may save a life. This may also be a scheduled procedure for irregular or fast heart rhythms thatare not immediately life-threatening. Tell a health care provider about: Any allergies you have. All medicines you are taking, including vitamins, herbs, eye drops, creams, and over-the-counter medicines. Any problems you or family members  have had with anesthetic medicines. Any blood disorders you have. Any surgeries you have had. Any medical conditions you have. Whether you are pregnant or may be pregnant. What are the risks? Generally, this is a safe procedure. However, problems may occur, including: Allergic reactions to medicines. A blood clot that breaks free and travels to other parts of your body. The possible return of an abnormal heart rhythm within hours or days  after the procedure. Your heart stopping (cardiac arrest). This is rare. What happens before the procedure? Medicines Your health care provider may have you start taking: Blood-thinning medicines (anticoagulants) so your blood does not clot as easily. Medicines to help stabilize your heart rate and rhythm. Ask your health care provider about: Changing or stopping your regular medicines. This is especially important if you are taking diabetes medicines or blood thinners. Taking medicines such as aspirin and ibuprofen. These medicines can thin your blood. Do not take these medicines unless your health care provider tells you to take them. Taking over-the-counter medicines, vitamins, herbs, and supplements. General instructions Follow instructions from your health care provider about eating or drinking restrictions. Plan to have someone take you home from the hospital or clinic. If you will be going home right after the procedure, plan to have someone with you for 24 hours. Ask your health care provider what steps will be taken to help prevent infection. These may include washing your skin with a germ-killing soap. What happens during the procedure?  An IV will be inserted into one of your veins. Sticky patches (electrodes) or metal paddles may be placed on your chest. You will be given a medicine to help you relax (sedative). An electrical shock will be delivered. The procedure may vary among health care providers and hospitals. What can I expect after the procedure? Your blood pressure, heart rate, breathing rate, and blood oxygen level will be monitored until you leave the hospital or clinic. Your heart rhythm will be watched to make sure it does not change. You may have some redness on the skin where the shocks were given. Follow these instructions at home: Do not drive for 24 hours if you were given a sedative during your procedure. Take over-the-counter and prescription medicines only  as told by your health care provider. Ask your health care provider how to check your pulse. Check it often. Rest for 48 hours after the procedure or as told by your health care provider. Avoid or limit your caffeine use as told by your health care provider. Keep all follow-up visits as told by your health care provider. This is important. Contact a health care provider if: You feel like your heart is beating too quickly or your pulse is not regular. You have a serious muscle cramp that does not go away. Get help right away if: You have discomfort in your chest. You are dizzy or you feel faint. You have trouble breathing or you are short of breath. Your speech is slurred. You have trouble moving an arm or leg on one side of your body. Your fingers or toes turn cold or blue. Summary Electrical cardioversion is the delivery of a jolt of electricity to restore a normal rhythm to the heart. This procedure may be done right away in an emergency or may be a scheduled procedure if the condition is not an emergency. Generally, this is a safe procedure. After the procedure, check your pulse often as told by your health care provider. This information is not intended  to replace advice given to you by your health care provider. Make sure you discuss any questions you have with your healthcare provider. Document Revised: 03/10/2019 Document Reviewed: 03/10/2019 Elsevier Patient Education  [place].

## 2021-04-14 LAB — CBC
Hematocrit: 49.9 % (ref 37.5–51.0)
Hemoglobin: 16.9 g/dL (ref 13.0–17.7)
MCH: 33.3 pg — ABNORMAL HIGH (ref 26.6–33.0)
MCHC: 33.9 g/dL (ref 31.5–35.7)
MCV: 98 fL — ABNORMAL HIGH (ref 79–97)
Platelets: 131 10*3/uL — ABNORMAL LOW (ref 150–450)
RBC: 5.08 x10E6/uL (ref 4.14–5.80)
RDW: 12.8 % (ref 11.6–15.4)
WBC: 6 10*3/uL (ref 3.4–10.8)

## 2021-04-14 LAB — COMPREHENSIVE METABOLIC PANEL
ALT: 24 IU/L (ref 0–44)
AST: 24 IU/L (ref 0–40)
Albumin/Globulin Ratio: 2.2 (ref 1.2–2.2)
Albumin: 4.6 g/dL (ref 3.6–4.6)
Alkaline Phosphatase: 80 IU/L (ref 44–121)
BUN/Creatinine Ratio: 22 (ref 10–24)
BUN: 21 mg/dL (ref 8–27)
Bilirubin Total: 0.4 mg/dL (ref 0.0–1.2)
CO2: 24 mmol/L (ref 20–29)
Calcium: 9.5 mg/dL (ref 8.6–10.2)
Chloride: 103 mmol/L (ref 96–106)
Creatinine, Ser: 0.95 mg/dL (ref 0.76–1.27)
Globulin, Total: 2.1 g/dL (ref 1.5–4.5)
Glucose: 101 mg/dL — ABNORMAL HIGH (ref 65–99)
Potassium: 4.5 mmol/L (ref 3.5–5.2)
Sodium: 141 mmol/L (ref 134–144)
Total Protein: 6.7 g/dL (ref 6.0–8.5)
eGFR: 80 mL/min/{1.73_m2} (ref >59)

## 2021-04-14 LAB — TSH: TSH: 4.19 u[IU]/mL (ref 0.450–4.500)

## 2021-04-14 LAB — MAGNESIUM: Magnesium: 2.3 mg/dL (ref 1.6–2.3)

## 2021-04-15 ENCOUNTER — Encounter: Payer: Self-pay | Admitting: Internal Medicine

## 2021-04-15 ENCOUNTER — Telehealth: Payer: Self-pay | Admitting: *Deleted

## 2021-04-15 DIAGNOSIS — I4819 Other persistent atrial fibrillation: Secondary | ICD-10-CM

## 2021-04-15 NOTE — Telephone Encounter (Signed)
Scheduled

## 2021-04-15 NOTE — Telephone Encounter (Signed)
-----   Message from [name], MD sent at 04/15/2021  6:58 AM EDT ----- Denna Haggard,  I forgot to mention that [name] and I had agreed to set him up for an echocardiogram in the next few weeks for evaluation of his persistent atrial fibrillation (does not have to be done before cardioversion).  Would you be able to help set this up for him?  Thanks and sorry for the inconvenience.  Gerald Stabs

## 2021-04-15 NOTE — Telephone Encounter (Signed)
Order placed for echocardiogram for persistent atrial fibrillation.  Spoke with pt's wife, Phillip [name] (DPR approved), and made aware that she will receive a call from scheduling to have this arranged. Pat voiced understanding and has no further questions.

## 2021-04-18 NOTE — Telephone Encounter (Signed)
Echo scheduled 04/29/21.

## 2021-04-23 ENCOUNTER — Other Ambulatory Visit: Payer: Self-pay | Admitting: Internal Medicine

## 2021-04-25 ENCOUNTER — Other Ambulatory Visit: Payer: Self-pay | Admitting: Family Medicine

## 2021-04-25 DIAGNOSIS — I10 Essential (primary) hypertension: Secondary | ICD-10-CM

## 2021-04-25 DIAGNOSIS — E039 Hypothyroidism, unspecified: Secondary | ICD-10-CM

## 2021-04-25 DIAGNOSIS — D696 Thrombocytopenia, unspecified: Secondary | ICD-10-CM

## 2021-04-25 DIAGNOSIS — R7303 Prediabetes: Secondary | ICD-10-CM

## 2021-04-25 DIAGNOSIS — E785 Hyperlipidemia, unspecified: Secondary | ICD-10-CM

## 2021-04-25 DIAGNOSIS — I4891 Unspecified atrial fibrillation: Secondary | ICD-10-CM

## 2021-04-26 ENCOUNTER — Other Ambulatory Visit (INDEPENDENT_AMBULATORY_CARE_PROVIDER_SITE_OTHER): Payer: PPO

## 2021-04-26 ENCOUNTER — Other Ambulatory Visit: Payer: Self-pay

## 2021-04-26 DIAGNOSIS — E785 Hyperlipidemia, unspecified: Secondary | ICD-10-CM

## 2021-04-26 DIAGNOSIS — I10 Essential (primary) hypertension: Secondary | ICD-10-CM | POA: Diagnosis not present

## 2021-04-26 DIAGNOSIS — R7303 Prediabetes: Secondary | ICD-10-CM

## 2021-04-26 LAB — LIPID PANEL
Cholesterol: 102 mg/dL (ref 0–200)
HDL: 38.6 mg/dL — ABNORMAL LOW (ref >39.00)
LDL Cholesterol: 50 mg/dL (ref 0–99)
NonHDL: 63.87
Total CHOL/HDL Ratio: 3
Triglycerides: 67 mg/dL (ref 0.0–149.0)
VLDL: 13.4 mg/dL (ref 0.0–40.0)

## 2021-04-26 LAB — MICROALBUMIN / CREATININE URINE RATIO
Creatinine,U: 188.6 mg/dL
Microalb Creat Ratio: 3 mg/g (ref 0.0–30.0)
Microalb, Ur: 5.6 mg/dL — ABNORMAL HIGH (ref 0.0–1.9)

## 2021-04-26 LAB — HEMOGLOBIN A1C: Hgb A1c MFr Bld: 6.1 % (ref 4.6–6.5)

## 2021-04-29 ENCOUNTER — Other Ambulatory Visit: Payer: Self-pay

## 2021-04-29 ENCOUNTER — Ambulatory Visit (INDEPENDENT_AMBULATORY_CARE_PROVIDER_SITE_OTHER): Payer: PPO

## 2021-04-29 DIAGNOSIS — I4819 Other persistent atrial fibrillation: Secondary | ICD-10-CM | POA: Diagnosis not present

## 2021-04-29 LAB — ECHOCARDIOGRAM COMPLETE
AR max vel: 2.72 cm2
AV Area VTI: 2.65 cm2
AV Area mean vel: 2.56 cm2
AV Mean grad: 4 mmHg
AV Peak grad: 7.5 mmHg
Ao pk vel: 1.37 m/s
Area-P 1/2: 2.99 cm2
Calc EF: 44.3 %
S' Lateral: 3.6 cm
Single Plane A2C EF: 44.9 %
Single Plane A4C EF: 46.1 %

## 2021-05-02 ENCOUNTER — Encounter: Payer: Self-pay | Admitting: Family Medicine

## 2021-05-02 ENCOUNTER — Other Ambulatory Visit: Payer: Self-pay

## 2021-05-02 ENCOUNTER — Ambulatory Visit (INDEPENDENT_AMBULATORY_CARE_PROVIDER_SITE_OTHER): Payer: PPO | Admitting: Family Medicine

## 2021-05-02 VITALS — BP 136/86 | HR 67 | Temp 97.9°F | Ht 71.75 in | Wt 197.2 lb

## 2021-05-02 DIAGNOSIS — I2510 Atherosclerotic heart disease of native coronary artery without angina pectoris: Secondary | ICD-10-CM

## 2021-05-02 DIAGNOSIS — E785 Hyperlipidemia, unspecified: Secondary | ICD-10-CM

## 2021-05-02 DIAGNOSIS — K5909 Other constipation: Secondary | ICD-10-CM

## 2021-05-02 DIAGNOSIS — Z951 Presence of aortocoronary bypass graft: Secondary | ICD-10-CM

## 2021-05-02 DIAGNOSIS — H903 Sensorineural hearing loss, bilateral: Secondary | ICD-10-CM

## 2021-05-02 DIAGNOSIS — R7303 Prediabetes: Secondary | ICD-10-CM

## 2021-05-02 DIAGNOSIS — Z0000 Encounter for general adult medical examination without abnormal findings: Secondary | ICD-10-CM

## 2021-05-02 DIAGNOSIS — I10 Essential (primary) hypertension: Secondary | ICD-10-CM

## 2021-05-02 DIAGNOSIS — I6523 Occlusion and stenosis of bilateral carotid arteries: Secondary | ICD-10-CM

## 2021-05-02 DIAGNOSIS — I4891 Unspecified atrial fibrillation: Secondary | ICD-10-CM

## 2021-05-02 DIAGNOSIS — N3281 Overactive bladder: Secondary | ICD-10-CM

## 2021-05-02 DIAGNOSIS — Z23 Encounter for immunization: Secondary | ICD-10-CM

## 2021-05-02 DIAGNOSIS — R454 Irritability and anger: Secondary | ICD-10-CM

## 2021-05-02 DIAGNOSIS — I5022 Chronic systolic (congestive) heart failure: Secondary | ICD-10-CM

## 2021-05-02 DIAGNOSIS — R011 Cardiac murmur, unspecified: Secondary | ICD-10-CM

## 2021-05-02 DIAGNOSIS — N401 Enlarged prostate with lower urinary tract symptoms: Secondary | ICD-10-CM

## 2021-05-02 DIAGNOSIS — Z85818 Personal history of malignant neoplasm of other sites of lip, oral cavity, and pharynx: Secondary | ICD-10-CM

## 2021-05-02 DIAGNOSIS — T66XXXS Radiation sickness, unspecified, sequela: Secondary | ICD-10-CM

## 2021-05-02 DIAGNOSIS — D696 Thrombocytopenia, unspecified: Secondary | ICD-10-CM

## 2021-05-02 DIAGNOSIS — E039 Hypothyroidism, unspecified: Secondary | ICD-10-CM

## 2021-05-02 NOTE — Assessment & Plan Note (Signed)
Discussed with patient

## 2021-05-02 NOTE — Assessment & Plan Note (Signed)
Has started eating more solid foods and less supplements.

## 2021-05-02 NOTE — Assessment & Plan Note (Signed)
Benefiting from low dose SSRI - continue.

## 2021-05-02 NOTE — Assessment & Plan Note (Signed)
Saw ENT, f/u PRN.

## 2021-05-02 NOTE — Assessment & Plan Note (Signed)
Chronic, stable on crestor - continue. The ASCVD Risk score (Arnett DK, et al., 2019) failed to calculate for the following reasons:   The 2019 ASCVD risk score is only valid for ages 79 to 22

## 2021-05-02 NOTE — Assessment & Plan Note (Signed)
Chronic, stable on current regimen.  

## 2021-05-02 NOTE — Patient Instructions (Addendum)
Flu shot today.  You are doing well today Return as needed or in 6 months for follow up visit.   Health Maintenance After Age 81 After age 4, you are at a higher risk for certain long-term diseases and infections as well as injuries from falls. Falls are a major cause of broken bones and head injuries in people who are older than age 9. Getting regular preventive care can help to keep you healthy and well. Preventive care includes getting regular testing and making lifestyle changes as recommended by your health care provider. Talk with your health care provider about: Which screenings and tests you should have. A screening is a test that checks for a disease when you have no symptoms. A diet and exercise plan that is right for you. What should I know about screenings and tests to prevent falls? Screening and testing are the best ways to find a health problem early. Early diagnosis and treatment give you the best chance of managing medical conditions that are common after age 4. Certain conditions and lifestyle choices may make you more likely to have a fall. Your health care provider may recommend: Regular vision checks. Poor vision and conditions such as cataracts can make you more likely to have a fall. If you wear glasses, make sure to get your prescription updated if your vision changes. Medicine review. Work with your health care provider to regularly review all of the medicines you are taking, including over-the-counter medicines. Ask your health care provider about any side effects that may make you more likely to have a fall. Tell your health care provider if any medicines that you take make you feel dizzy or sleepy. Osteoporosis screening. Osteoporosis is a condition that causes the bones to get weaker. This can make the bones weak and cause them to break more easily. Blood pressure screening. Blood pressure changes and medicines to control blood pressure can make you feel dizzy. Strength  and balance checks. Your health care provider may recommend certain tests to check your strength and balance while standing, walking, or changing positions. Foot health exam. Foot pain and numbness, as well as not wearing proper footwear, can make you more likely to have a fall. Depression screening. You may be more likely to have a fall if you have a fear of falling, feel emotionally low, or feel unable to do activities that you used to do. Alcohol use screening. Using too much alcohol can affect your balance and may make you more likely to have a fall. What actions can I take to lower my risk of falls? General instructions Talk with your health care provider about your risks for falling. Tell your health care provider if: You fall. Be sure to tell your health care provider about all falls, even ones that seem minor. You feel dizzy, sleepy, or off-balance. Take over-the-counter and prescription medicines only as told by your health care provider. These include any supplements. Eat a healthy diet and maintain a healthy weight. A healthy diet includes low-fat dairy products, low-fat (lean) meats, and fiber from whole grains, beans, and lots of fruits and vegetables. Home safety Remove any tripping hazards, such as rugs, cords, and clutter. Install safety equipment such as grab bars in bathrooms and safety rails on stairs. Keep rooms and walkways well-lit. Activity  Follow a regular exercise program to stay fit. This will help you maintain your balance. Ask your health care provider what types of exercise are appropriate for you. If you need a  cane or walker, use it as recommended by your health care provider. Wear supportive shoes that have nonskid soles. Lifestyle Do not drink alcohol if your health care provider tells you not to drink. If you drink alcohol, limit how much you have: 0-1 drink a day for women. 0-2 drinks a day for men. Be aware of how much alcohol is in your drink. In the  [place] one drink equals one typical bottle of beer (12 oz), one-half glass of wine (5 oz), or one shot of hard liquor (1 oz). Do not use any products that contain nicotine or tobacco, such as cigarettes and e-cigarettes. If you need help quitting, ask your health care provider. Summary Having a healthy lifestyle and getting preventive care can help to protect your health and wellness after age 72. Screening and testing are the best way to find a health problem early and help you avoid having a fall. Early diagnosis and treatment give you the best chance for managing medical conditions that are more common for people who are older than age 51. Falls are a major cause of broken bones and head injuries in people who are older than age 5. Take precautions to prevent a fall at home. Work with your health care provider to learn what changes you can make to improve your health and wellness and to prevent falls. This information is not intended to replace advice given to you by your health care provider. Make sure you discuss any questions you have with your health care provider. Document Revised: 10/15/2020 Document Reviewed: 07/23/2020 Elsevier Patient Education  2022 [employer].

## 2021-05-02 NOTE — Assessment & Plan Note (Signed)
No bruits appreciated.  

## 2021-05-02 NOTE — Assessment & Plan Note (Signed)
Not appreciated today. Aortic sclerosis without stenosis by recent US.

## 2021-05-02 NOTE — Assessment & Plan Note (Signed)
Chronic, stable TSH.

## 2021-05-02 NOTE — Addendum Note (Signed)
Addended by: Brenton Grills on: [id number] AM   Modules accepted: Orders

## 2021-05-02 NOTE — Assessment & Plan Note (Signed)
Stable period on daily docusate with PRN miralax.

## 2021-05-02 NOTE — Assessment & Plan Note (Signed)
Appreciate cards care. Now on eliquis and toprol XL with planned upcoming cardioversion.

## 2021-05-02 NOTE — Assessment & Plan Note (Signed)

## 2021-05-02 NOTE — Assessment & Plan Note (Signed)
Wears hearing aides 

## 2021-05-02 NOTE — Assessment & Plan Note (Signed)
flomax didn't help ongoing nocturia, urge incontinence.

## 2021-05-02 NOTE — Assessment & Plan Note (Addendum)
flomax ineffective for nocturia.  Consider trial oxybutynin IR at night

## 2021-05-02 NOTE — Assessment & Plan Note (Signed)
Chronic, stable 

## 2021-05-02 NOTE — Assessment & Plan Note (Addendum)
Recent EF 45-50%  Cards planning to restart lasix '20mg'$  daily.

## 2021-05-02 NOTE — Assessment & Plan Note (Signed)
Preventative protocols reviewed and updated unless pt declined. Discussed healthy diet and lifestyle.  

## 2021-05-02 NOTE — Assessment & Plan Note (Signed)
Appreciate cards care. 

## 2021-05-02 NOTE — Progress Notes (Signed)
Patient ID: Phillip [name], male    DOB: July 08, 1941, 80 y.o.   MRN: [id number]  This visit was conducted in person.  BP 136/86   Pulse 67   Temp 97.9 F (36.6 C) (Temporal)   Ht 5' 11.75" (1.822 m)   Wt 197 lb 3 oz (89.4 kg)   SpO2 96%   BMI 26.93 kg/m    CC: AMW/CPE Subjective:   HPI: Phillip [name] is a 80 y.o. male presenting on 05/02/2021 for Medicare Wellness (Pt accompanied by wife, Fraser Din- temp 9.1.)   Did not see health advisor.   Hearing Screening - Comments:: Wears bilateral hearing aids.  Wearing at today's OV.  Vision Screening - Comments:: Last eye exam, 06/2020.  [place] [facility] Visit from 05/02/2021 in [place] at [facility] Total Score 0     Known bilateral sensorineural hearing loss saw ENT discussed considering cochlear implants.  Fall Risk  05/02/2021 04/28/2020 04/25/2019 02/07/2018 11/07/2017  Falls in the past year? - 0 0 No No  Number falls in past yr: 0 0 - - -  Injury with Fall? 1 0 - - -  Comment Abrasions - - - -  Risk for fall due to : - Medication side effect - - -  Follow up - Falls evaluation completed;Falls prevention discussed - - -  Saw Dr Einar Pheasant then cards for above fall with weakness. Never passed out.   Recent new onset afib last month presented with increased heart rate to 80s (from normal 40-50s), started on eliquis. Saw cards Dr End - eliquis continued and toprol XL '25mg'$  added to regimen. Planning cardioversion after 4 weeks of anticoagulation. Now of aspirin.   Irritability/anger - on sertraline '25mg'$  daily with some benefit noted.   Preventative: COLONOSCOPY 03/2015 TA x3, HP, melanosis coli, severe diverticulosis, rpt 3 yrs (Pyrtle) COLONOSCOPY 12/2018 - fair prep, diverticulosis, f/u PRN (Pyrtle) Prostate cancer screening - prior saw urology (Dr Risa Grill). Ongoing LUTS and nocturia. Established with Dr Diamantina Providence  Lung cancer screening - not eligible Flu shot - yearly  [place] 09/2019, 10/2019, booster x2  06/2020, 02/2021   Td 2010, Tdap 05/2019  Pneumovax 2007, 2016, prevnar 2015 x2  Zostavax 2006 Shingrix - 05/2019, 08/2020 Advanced directive discussion - has at home, will bring [place] copy. Wife is HCPOA. Full code but does not want prolonged life support if terminal condition. Ok with feeding tube.  Seat belt use discussed  Sunscreen use discussed. No changing moles on skin. Sees derm yearly  Sleep - averages 7-8 hours/night, lots of night time awakenings, naps during the day Non smoker  Alcohol - rarely  Dentist q6 mo  Eye exam yearly  Bowel - constipation manage with stool softener daily, miralax prn Bladder - chronic urge incontinence/OAB - PTNS ineffective. Wears depends daily. Symptoms worse at night time (Q1 hour voiding). Flomax ineffective to help nocturia.   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  Activity: Plays golf, remains very active with an interest in politics. Jan 2011 moved to area. Involved in [employer]. He is a runner - has run [employer] and [employer]. S/p torn meniscus.  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 Interim medical history since our last visit  reviewed. Allergies and medications reviewed and updated. Outpatient Medications Prior to Visit  Medication Sig Dispense Refill  . amLODipine (NORVASC) 10 MG tablet Take 1 tablet (10 mg total) by mouth daily. 90 tablet 1  . apixaban (ELIQUIS) 5 MG TABS tablet Take 1 tablet (5 mg total) by mouth 2 (two) times daily. 60 tablet 0  . Cholecalciferol (VITAMIN D3) 25 MCG (1000 UT) CAPS Take 1 capsule by mouth daily.    Marland Kitchen docusate sodium (COLACE) 250 MG capsule Takes total '600mg'$  /day    . levothyroxine (EUTHYROX) 50 MCG  tablet Take 1 tablet (50 mcg total) by mouth daily before breakfast. 30 tablet 9  . MAGNESIUM PO Take 1 tablet by mouth daily.    . metoprolol succinate (TOPROL XL) 25 MG 24 hr tablet Take 1 tablet (25 mg total) by mouth daily. 30 tablet 3  . Multiple Vitamins-Minerals (ZINC PO) Take by mouth daily.    . nitroGLYCERIN (NITROSTAT) 0.4 MG SL tablet Place 1 tablet (0.4 mg total) under the tongue every 5 (five) minutes as needed for chest pain (Maximum 3 doses.). 25 tablet 2  . Omega-3 Fatty Acids (OMEGA 3 PO) Take by mouth daily.    . ondansetron (ZOFRAN-ODT) 4 MG disintegrating tablet Take 1 tablet (4 mg total) by mouth every 8 (eight) hours as needed for nausea or vomiting. 20 tablet 0  . polyethylene glycol powder (GLYCOLAX/MIRALAX) 17 GM/SCOOP powder Take 17 g by mouth daily. (Patient taking differently: Take 17 g by mouth daily. As needed)    . Potassium 99 MG TABS Take 1 tablet by mouth daily.    . rosuvastatin (CRESTOR) 20 MG tablet Take 1 tablet by mouth once daily 90 tablet 0  . sertraline (ZOLOFT) 25 MG tablet Take 1 tablet (25 mg total) by mouth daily. 90 tablet 2  . VITAMIN A PO Take 2,400 mcg by mouth daily.     . vitamin E 400 UNIT capsule Take 400 Units by mouth daily.    . zaleplon (SONATA) 10 MG capsule Take 1 capsule (10 mg total) by mouth at bedtime as needed for sleep. 30 capsule 0  . Zinc 50 MG CAPS Take 50 mg by mouth daily.     No facility-administered medications prior to visit.     Per HPI unless specifically indicated in ROS section below Review of Systems  Constitutional:  Negative for activity change, appetite change, chills, fatigue, fever and unexpected weight change.  HENT:  Negative for hearing loss.   Eyes:  Negative for visual disturbance.  Respiratory:  Negative for cough, chest tightness, shortness of breath and wheezing.   Cardiovascular:  Positive for palpitations. Negative for chest pain and leg swelling.  Gastrointestinal:  Negative for abdominal  distention, abdominal pain, blood in stool, constipation, diarrhea, nausea and vomiting.  Genitourinary:  Negative for difficulty urinating and hematuria.  Musculoskeletal:  Negative for arthralgias, myalgias and neck pain.  Skin:  Negative for rash.  Neurological:  Positive for dizziness. Negative for seizures, syncope and headaches.  Hematological:  Negative for adenopathy. Does not bruise/bleed easily.  Psychiatric/Behavioral:  Negative for dysphoric mood. The patient is not nervous/anxious.    Objective:  BP 136/86   Pulse 67   Temp 97.9 F (36.6 C) (Temporal)   Ht 5' 11.75" (1.822 m)   Wt 197 lb 3 oz (89.4 kg)   SpO2 96%   BMI 26.93 kg/m   Wt Readings from Last 3 Encounters:  05/02/21 197 lb 3 oz (89.4 kg)  04/13/21 199 lb 8 oz (90.5 kg)  04/06/21 200 lb 8 oz (90.9 kg)      Physical Exam Vitals and nursing note reviewed.  Constitutional:      General: He is not in acute distress.    Appearance: Normal appearance. He is well-developed. He is not ill-appearing.  HENT:     Head: Normocephalic and atraumatic.     Right Ear: Hearing normal.     Left Ear: Hearing normal.     Ears:     Comments: Deferred - wears hearing aides Eyes:     General: No scleral icterus.    Extraocular Movements: Extraocular movements intact.     Conjunctiva/sclera: Conjunctivae normal.     Pupils: Pupils are equal, round, and reactive to light.  Neck:     Thyroid: No thyroid mass or thyromegaly.     Vascular: No carotid bruit.     Comments: Woody consistency to neck post-radiation Cardiovascular:     Rate and Rhythm: Normal rate. Rhythm irregularly irregular.     Pulses: Normal pulses.          Radial pulses are 2+ on the right side and 2+ on the left side.     Heart sounds: Normal heart sounds. No murmur heard. Pulmonary:     Effort: Pulmonary effort is normal. No respiratory distress.     Breath sounds: Normal breath sounds. No wheezing, rhonchi or rales.  Abdominal:     General: Bowel  sounds are normal. There is no distension.     Palpations: Abdomen is soft. There is no mass.     Tenderness: There is no abdominal tenderness. There is no guarding or rebound.     Hernia: No hernia is present.  Musculoskeletal:        General: Normal range of motion.     Cervical back: Normal range of motion.     Right lower leg: Edema (tr) present.     Left lower leg: Edema (tr) present.  Lymphadenopathy:     Cervical: No cervical adenopathy.  Skin:    General: Skin is warm and dry.     Findings: No rash.  Neurological:     General: No focal deficit present.     Mental Status: He is alert and oriented to person, place, and time.     Comments:  Recall 3/3 Calculation 5/5 DLROW  Psychiatric:        Mood and Affect: Mood normal.        Behavior: Behavior normal.        Thought Content: Thought content normal.        Judgment: Judgment normal.      Results for orders placed or performed in visit on 04/29/21  ECHOCARDIOGRAM COMPLETE  Result Value Ref Range   AR max vel 2.72 cm2   AV Peak grad 7.5 mmHg   Ao pk vel 1.37 m/s   S' Lateral 3.60 cm   Area-P 1/2 2.99 cm2   AV Area VTI 2.65 cm2   AV Mean grad 4.0 mmHg   Single Plane A4C EF 46.1 %   Single Plane A2C EF 44.9 %   Calc EF 44.3 %   AV Area mean vel 2.56 cm2   Lab Results  Component Value Date   WBC 6.0 04/13/2021   HGB 16.9 04/13/2021   HCT 49.9 04/13/2021   MCV 98 (H) 04/13/2021   PLT 131 (L) 04/13/2021    Lab Results  Component Value Date   TSH  4.190 04/13/2021    Assessment & Plan:  This visit occurred during the SARS-CoV-2 public health emergency.  Safety protocols were in place, including screening questions prior to the visit, additional usage of staff PPE, and extensive cleaning of exam room while observing appropriate contact time as indicated for disinfecting solutions.   Problem List Items Addressed This Visit     Medicare annual wellness visit, subsequent - Primary (Chronic)    I have personally  reviewed the Medicare Annual Wellness questionnaire and have noted 1. [name] medical and social history 2. Their use of alcohol, tobacco or illicit drugs 3. Their current medications and supplements 4. [name] functional ability including ADL's, fall risks, home safety risks and hearing or visual impairment. Cognitive function has been assessed and addressed as indicated.  5. Diet and physical activity 6. Evidence for depression or mood disorders The patients weight, height, BMI have been recorded in the chart. I have made referrals, counseling and provided education to the patient based on review of the above and I have provided the pt with a written personalized care plan for preventive services. Provider list updated.. See scanned questionairre as needed for further documentation. Reviewed preventative protocols and updated unless pt declined.       Health maintenance examination (Chronic)    Preventative protocols reviewed and updated unless pt declined. Discussed healthy diet and lifestyle.       History of cancer tonsil    Saw ENT, f/u PRN.       Essential hypertension    Chronic, stable on current regimen.       Coronary artery disease involving native coronary artery of native heart without angina pectoris    Appreciate cards care.       BPH associated with nocturia    flomax didn't help ongoing nocturia, urge incontinence.       Thrombocytopenia (HCC)    Chronic, stable.       Hypothyroidism, acquired    Chronic, stable TSH.       Bilateral carotid artery stenosis    No bruits appreciated.       Prediabetes    Discussed with patient       Hyperlipidemia LDL goal <70    Chronic, stable on crestor - continue. The ASCVD Risk score (Arnett DK, et al., 2019) failed to calculate for the following reasons:   The 2019 ASCVD risk score is only valid for ages 68 to 42       Bilateral sensorineural hearing loss    Wears hearing aides.        Systolic murmur    Not appreciated today. Aortic sclerosis without stenosis by recent US.       Adverse effect of radiation    Has started eating more solid foods and less supplements.       Detrusor instability of bladder    flomax ineffective for nocturia.  Consider trial oxybutynin IR at night      S/P CABG x 3   Chronic systolic heart failure (HCC)    Recent EF 45-50%  Cards planning to restart lasix '20mg'$  daily.       Chronic constipation    Stable period on daily docusate with PRN miralax.       Irritability and anger    Benefiting from low dose SSRI - continue.       Atrial fibrillation ([place])    Appreciate cards care. Now on eliquis and toprol XL with planned upcoming cardioversion.  No orders of the defined types were placed in this encounter.  No orders of the defined types were placed in this encounter.   Patient instructions: Flu shot today.  You are doing well today Return as needed or in 6 months for follow up visit.   Follow up plan: Return in about 1 year (around 05/02/2022) for follow up visit.  [name], MD

## 2021-05-03 ENCOUNTER — Telehealth: Payer: Self-pay | Admitting: *Deleted

## 2021-05-03 DIAGNOSIS — I5022 Chronic systolic (congestive) heart failure: Secondary | ICD-10-CM

## 2021-05-03 DIAGNOSIS — Z79899 Other long term (current) drug therapy: Secondary | ICD-10-CM

## 2021-05-03 DIAGNOSIS — I4819 Other persistent atrial fibrillation: Secondary | ICD-10-CM

## 2021-05-03 DIAGNOSIS — I4891 Unspecified atrial fibrillation: Secondary | ICD-10-CM

## 2021-05-03 DIAGNOSIS — I255 Ischemic cardiomyopathy: Secondary | ICD-10-CM

## 2021-05-03 NOTE — Telephone Encounter (Signed)
-----   Message from [name], MD sent at 04/30/2021  4:13 PM EDT ----- Please let Phillip [name] know that his echocardiogram shows that his heart's pumping function is mildly decreased but similar to prior echocardiograms.  There is evidence of some fluid retention, which may be related to his atrial fibrillation.  I recommend that we start furosemide 20 mg daily with BMP in ~1 week.  We will proceed with cardioversion as planned later this month.

## 2021-05-03 NOTE — Telephone Encounter (Signed)
Attempted to call pt to review results. No answer. Lmtcb.  

## 2021-05-04 NOTE — Telephone Encounter (Signed)
Patient screens calls .  Please leave a msg if no ans and he will be able to answer.

## 2021-05-04 NOTE — Telephone Encounter (Signed)
Patient returning call for results and also medication advise.  Please call home number if no ans try wifes cell [phone] alternate number

## 2021-05-05 MED ORDER — APIXABAN 5 MG PO TABS
5.0000 mg | ORAL_TABLET | Freq: Two times a day (BID) | ORAL | 3 refills | Status: DC
Start: 2021-05-05 — End: 2021-12-27

## 2021-05-05 MED ORDER — FUROSEMIDE 20 MG PO TABS: 20.0000 mg | ORAL_TABLET | Freq: Every day | ORAL | 6 refills | Status: DC

## 2021-05-05 MED ORDER — APIXABAN 5 MG PO TABS: 5.0000 mg | ORAL_TABLET | Freq: Two times a day (BID) | ORAL | 3 refills | Status: DC

## 2021-05-05 NOTE — Telephone Encounter (Signed)
The patient called back to the office to discuss his echo results and to clarify questions regarding his medications.  I have notified the patient of his echo results and Dr. Marisue Humble recommendations to: 1) START lasix 20 mg once daily 2) Repeat a BMP in 1 week  The patient voices understanding of his results and is agreeable with staring lasix 20 mg once daily. He is aware to take this in the mornings or no later than 2 pm if possible. He is also aware he will need to hold the lasix the morning of his DCCV on 05/10/21. He is agreeable with a repeat lab on Friday 05/13/21 at the office at 10:00 am.   The patient also advised he only has a couple of days on his eliquis and there are no refills on this. He was unsure if he was to stop this and resume ASA, or continue Eliquis until his DCCV.  I have advised the patient that he CANNOT miss any doses of Eliquis. He is aware we will refill this at the pharmacy for him along with RX for furosemide to be sent in.  All questions were answered.  The patient was very appreciative he was able to speak with someone today.    Will forward to CVRR to review for ASAP Eliquis refill. Age: 80 WT: 89.4 kg Serum creatinine (04/13/21): 0.95 Currently on appropriate Eliquis dose of 5 mg BID, but will forward to Anticoag Pool per protocol to authorize refill.

## 2021-05-05 NOTE — Telephone Encounter (Signed)
Prescription refill request for Eliquis received. Indication: Afib Last office visit: END. 04/13/2021 Scr: 0.95, 04/13/2021 Age: 80 yo  Weight: 89.4 kg   Refill sent.

## 2021-05-10 ENCOUNTER — Encounter
Admission: RE | Disposition: A | Discharge status: Home / Self Care | Payer: Self-pay | Source: Home / Self Care | Attending: Internal Medicine

## 2021-05-10 ENCOUNTER — Ambulatory Visit: Payer: PPO | Admitting: Certified Registered"

## 2021-05-10 ENCOUNTER — Encounter: Payer: Self-pay | Admitting: Internal Medicine

## 2021-05-10 ENCOUNTER — Ambulatory Visit
Admission: RE | Admit: 2021-05-10 | Discharge: 2021-05-10 | Disposition: A | Discharge status: Home / Self Care | Payer: PPO | Attending: Internal Medicine | Admitting: Internal Medicine

## 2021-05-10 ENCOUNTER — Other Ambulatory Visit: Payer: Self-pay

## 2021-05-10 DIAGNOSIS — Z7901 Long term (current) use of anticoagulants: Secondary | ICD-10-CM | POA: Diagnosis not present

## 2021-05-10 DIAGNOSIS — I255 Ischemic cardiomyopathy: Secondary | ICD-10-CM | POA: Diagnosis not present

## 2021-05-10 DIAGNOSIS — Z8249 Family history of ischemic heart disease and other diseases of the circulatory system: Secondary | ICD-10-CM | POA: Insufficient documentation

## 2021-05-10 DIAGNOSIS — Z923 Personal history of irradiation: Secondary | ICD-10-CM | POA: Diagnosis not present

## 2021-05-10 DIAGNOSIS — Z79899 Other long term (current) drug therapy: Secondary | ICD-10-CM | POA: Diagnosis not present

## 2021-05-10 DIAGNOSIS — Z8616 Personal history of COVID-19: Secondary | ICD-10-CM | POA: Diagnosis not present

## 2021-05-10 DIAGNOSIS — Z885 Allergy status to narcotic agent status: Secondary | ICD-10-CM | POA: Diagnosis not present

## 2021-05-10 DIAGNOSIS — E785 Hyperlipidemia, unspecified: Secondary | ICD-10-CM | POA: Diagnosis not present

## 2021-05-10 DIAGNOSIS — Z951 Presence of aortocoronary bypass graft: Secondary | ICD-10-CM | POA: Diagnosis not present

## 2021-05-10 DIAGNOSIS — I4819 Other persistent atrial fibrillation: Secondary | ICD-10-CM | POA: Insufficient documentation

## 2021-05-10 DIAGNOSIS — I2510 Atherosclerotic heart disease of native coronary artery without angina pectoris: Secondary | ICD-10-CM | POA: Diagnosis not present

## 2021-05-10 DIAGNOSIS — I5022 Chronic systolic (congestive) heart failure: Secondary | ICD-10-CM | POA: Insufficient documentation

## 2021-05-10 DIAGNOSIS — I110 Hypertensive heart disease with heart failure: Secondary | ICD-10-CM | POA: Diagnosis not present

## 2021-05-10 HISTORY — PX: CARDIOVERSION: SHX1299

## 2021-05-10 LAB — BASIC METABOLIC PANEL
Anion gap: 9 (ref 5–15)
BUN: 19 mg/dL (ref 8–23)
CO2: 26 mmol/L (ref 22–32)
Calcium: 8.8 mg/dL — ABNORMAL LOW (ref 8.9–10.3)
Chloride: 105 mmol/L (ref 98–111)
Creatinine, Ser: 0.63 mg/dL (ref 0.61–1.24)
GFR, Estimated: >60 mL/min (ref >60)
Glucose, Bld: 129 mg/dL — ABNORMAL HIGH (ref 70–99)
Potassium: 3.7 mmol/L (ref 3.5–5.1)
Sodium: 140 mmol/L (ref 135–145)

## 2021-05-10 SURGERY — CARDIOVERSION
Anesthesia: General

## 2021-05-10 MED ORDER — SODIUM CHLORIDE 0.9 % IV SOLN: INTRAVENOUS | Status: DC | PRN

## 2021-05-10 MED ORDER — PROPOFOL 10 MG/ML IV BOLUS
INTRAVENOUS | Status: DC | PRN
  Administered 2021-05-10: 50 mg via INTRAVENOUS

## 2021-05-10 MED ORDER — PROPOFOL 10 MG/ML IV BOLUS
INTRAVENOUS | Status: AC
  Filled 2021-05-10: qty 20

## 2021-05-10 MED ORDER — METOPROLOL SUCCINATE ER 25 MG PO TB24: 12.5000 mg | ORAL_TABLET | Freq: Every day | ORAL | 3 refills | Status: DC

## 2021-05-10 NOTE — Interval H&P Note (Signed)
History and Physical Interval Note:  05/10/2021 7:27 AM  Phillip [name]  has presented today for surgery, with the diagnosis of persistent atrial fibrillation.  The various methods of treatment have been discussed with the patient and family. After consideration of risks, benefits and other options for treatment, the patient has consented to  Procedure(s): CARDIOVERSION (N/A) as a surgical intervention.  [name] history has been reviewed, patient examined, no change in status, stable for surgery.  I have reviewed the patient's chart and labs.  Questions were answered to the patient's satisfaction.     Marvene Strohm

## 2021-05-10 NOTE — Transfer of Care (Signed)
Immediate Anesthesia Transfer of Care Note  Patient: Phillip [name]  Procedure(s) Performed: CARDIOVERSION  Patient Location: PACU and Cath Lab  Anesthesia Type:General  Level of Consciousness: drowsy  Airway & Oxygen Therapy: Patient Spontanous Breathing and Patient connected to nasal cannula oxygen  Post-op Assessment: Report given to RN  Post vital signs: stable  Last Vitals:  Vitals Value Taken Time  BP 100/57 05/10/21 0740  Temp    Pulse 39 05/10/21 0740  Resp 22 05/10/21 0740  SpO2 99 % 05/10/21 0740    Last Pain:  Vitals:   05/10/21 0656  PainSc: 0-No pain         Complications: No notable events documented.

## 2021-05-10 NOTE — Anesthesia Postprocedure Evaluation (Signed)
Anesthesia Post Note  Patient: Phillip [name]  Procedure(s) Performed: CARDIOVERSION  Patient location during evaluation: Specials Recovery Anesthesia Type: General Level of consciousness: awake and alert Pain management: pain level controlled Vital Signs Assessment: post-procedure vital signs reviewed and stable Respiratory status: spontaneous breathing, nonlabored ventilation, respiratory function stable and patient connected to nasal cannula oxygen Cardiovascular status: blood pressure returned to baseline and stable Postop Assessment: no apparent nausea or vomiting Anesthetic complications: no   No notable events documented.   Last Vitals:  Vitals:   05/10/21 0830 05/10/21 0845  BP: (!) 138/97   Pulse: (!) 51   Resp: 17 (!) 21  Temp:    SpO2: 98%     Last Pain:  Vitals:   05/10/21 0830  PainSc: 0-No pain                 Precious Haws Labresha Mellor

## 2021-05-10 NOTE — CV Procedure (Signed)
    Cardioversion Note  Phillip [name] [id number] 1941-05-16  Procedure: DC Cardioversion Indications: Persistent atrial fibrillation  Procedure Details Consent: Obtained Time Out: Verified patient identification, verified procedure, site/side was marked, verified correct patient position, special equipment/implants available, Radiology Safety Procedures followed,  medications/allergies/relevent history reviewed, required imaging and test results available.  Performed  The patient has been on adequate anticoagulation.  The patient received IV propofol by anesthesia for sedation.  Synchronous cardioversion was performed at 200 joules x 1.  The cardioversion was successful with conversion to sinus bradycardia, junctional rhythm.  Complications: No apparent complications Patient did tolerate procedure well.  Recommendations: Hold metoprolol and follow-up in office as previously arranged.  Continue indefinite apixaban 5 mg BID.  [name]., MD 05/10/2021, 7:43 AM

## 2021-05-10 NOTE — Anesthesia Preprocedure Evaluation (Signed)
Anesthesia Evaluation  Patient identified by MRN, date of birth, ID band Patient awake    Reviewed: Allergy & Precautions, NPO status , Patient's Chart, lab work & pertinent test results  History of Anesthesia Complications Negative for: history of anesthetic complications  Airway Mallampati: II  TM Distance: >3 FB Neck ROM: Full    Dental  (+) Chipped   Pulmonary neg pulmonary ROS, neg shortness of breath,    Pulmonary exam normal breath sounds clear to auscultation       Cardiovascular hypertension, + angina + CAD, + CABG and + Peripheral Vascular Disease  + dysrhythmias Atrial Fibrillation + Valvular Problems/Murmurs  Rhythm:irregular     Neuro/Psych PSYCHIATRIC DISORDERS Anxiety  Neuromuscular disease    GI/Hepatic negative GI ROS, Neg liver ROS,   Endo/Other  negative endocrine ROSHypothyroidism   Renal/GU negative Renal ROS  negative genitourinary   Musculoskeletal negative musculoskeletal ROS (+)   Abdominal   Peds negative pediatric ROS (+)  Hematology negative hematology ROS (+)   Anesthesia Other Findings Past Medical History: No date: Anxiety 01/07/2018: Atrial flutter ([place])     Comment:  Post-op after CABG 09/12/2018: Basal cell carcinoma     Comment:  Left forehead above lateral brow. Nodular pattern No date: Cancer [facility]) 2003: Coronary artery disease     Comment:  a. nonobstructive CAD by cath in 2003 and 2005; b.                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 10/2017: echo showing reduced EF of 40-45%, HK of the              anterior, anteroseptal and apical myocardium with Grade 1              DD.  No date: Neck pain No date: Squamous cell carcinoma     Comment:  right tonsil- 2011 09/02/2019: Squamous cell carcinoma of skin     Comment:  Crown scalp. WD SCC 09/02/2019: Squamous cell carcinoma of skin     Comment:  Left distal lateral deltoid. SCCis arising in SK 02/02/2012: Thrombocytopenia ([place])     Comment:  unclear etiology No date: Tinnitus No date: Tubular adenoma of colon   Reproductive/Obstetrics                             Anesthesia Physical  Anesthesia Plan  ASA: 3  Anesthesia Plan: General   Post-op Pain Management:    Induction: Intravenous  PONV Risk Score and Plan: TIVA  Airway Management Planned: Nasal Cannula  Additional Equipment:   Intra-op Plan:   Post-operative Plan:   Informed Consent: I have reviewed the patients History and Physical, chart, labs and discussed the procedure including the risks, benefits and alternatives for the proposed anesthesia with the patient or authorized representative who has indicated his/her understanding and acceptance.     Dental advisory given  Plan Discussed with: CRNA  Anesthesia Plan Comments: (Patient consented for risks of anesthesia including but not limited to:  - adverse reactions to medications - risk of airway placement if required - damage to eyes, teeth, lips or other oral mucosa - nerve damage due to positioning  - sore throat or hoarseness - Damage to heart, brain, nerves, lungs, other parts of body or loss of  life  Patient voiced understanding.)        Anesthesia Quick Evaluation

## 2021-05-11 ENCOUNTER — Ambulatory Visit: Payer: PPO | Admitting: Internal Medicine

## 2021-05-11 ENCOUNTER — Encounter: Payer: Self-pay | Admitting: Internal Medicine

## 2021-05-13 ENCOUNTER — Other Ambulatory Visit: Payer: Self-pay

## 2021-05-13 ENCOUNTER — Other Ambulatory Visit (INDEPENDENT_AMBULATORY_CARE_PROVIDER_SITE_OTHER): Payer: PPO

## 2021-05-13 DIAGNOSIS — I255 Ischemic cardiomyopathy: Secondary | ICD-10-CM

## 2021-05-13 DIAGNOSIS — I4819 Other persistent atrial fibrillation: Secondary | ICD-10-CM

## 2021-05-13 DIAGNOSIS — Z79899 Other long term (current) drug therapy: Secondary | ICD-10-CM | POA: Diagnosis not present

## 2021-05-13 DIAGNOSIS — I5022 Chronic systolic (congestive) heart failure: Secondary | ICD-10-CM | POA: Diagnosis not present

## 2021-05-14 LAB — BASIC METABOLIC PANEL
BUN/Creatinine Ratio: 19 (ref 10–24)
BUN: 15 mg/dL (ref 8–27)
CO2: 24 mmol/L (ref 20–29)
Calcium: 9.5 mg/dL (ref 8.6–10.2)
Chloride: 103 mmol/L (ref 96–106)
Creatinine, Ser: 0.77 mg/dL (ref 0.76–1.27)
Glucose: 117 mg/dL — ABNORMAL HIGH (ref 65–99)
Potassium: 4.4 mmol/L (ref 3.5–5.2)
Sodium: 142 mmol/L (ref 134–144)
eGFR: 90 mL/min/{1.73_m2} (ref >59)

## 2021-05-27 ENCOUNTER — Other Ambulatory Visit: Payer: Self-pay

## 2021-05-27 ENCOUNTER — Encounter: Payer: Self-pay | Admitting: Internal Medicine

## 2021-05-27 ENCOUNTER — Ambulatory Visit: Payer: PPO | Admitting: Internal Medicine

## 2021-05-27 VITALS — BP 120/74 | HR 66 | Ht 73.0 in | Wt 199.0 lb

## 2021-05-27 DIAGNOSIS — I2510 Atherosclerotic heart disease of native coronary artery without angina pectoris: Secondary | ICD-10-CM

## 2021-05-27 DIAGNOSIS — I4819 Other persistent atrial fibrillation: Secondary | ICD-10-CM

## 2021-05-27 DIAGNOSIS — E785 Hyperlipidemia, unspecified: Secondary | ICD-10-CM | POA: Diagnosis not present

## 2021-05-27 DIAGNOSIS — I255 Ischemic cardiomyopathy: Secondary | ICD-10-CM

## 2021-05-27 DIAGNOSIS — I5032 Chronic diastolic (congestive) heart failure: Secondary | ICD-10-CM

## 2021-05-27 DIAGNOSIS — I10 Essential (primary) hypertension: Secondary | ICD-10-CM | POA: Diagnosis not present

## 2021-05-27 NOTE — Progress Notes (Signed)
Follow-up Outpatient Visit Date: 05/27/2021  Primary Care Provider: [name], MD [place] [address]  Chief Complaint: Elevated blood pressures and follow-up atrial fibrillation  HPI:  Phillip [name] is a 80 y.o. male with history of coronary artery disease status post CABG in 01/4331, chronic systolic heart failure secondary to ischemic cardiomyopathy, persistent atrial fibrillation, hypertension, and hyperlipidemia, who presents for follow-up of atrial fibrillation status post recent cardioversion.  I last saw Phillip [name] in August, at which time he was again noted to be in atrial fibrillation after this diagnosis was made earlier in the month by his PCP.  He suspected that he had been in atrial fibrillation for a few months based on his heart rate trend.  He was continued on apixaban and underwent successful cardioversion on 05/10/2021.  He had also undergone an echocardiogram in early September that showed stable mildly reduced LVEF with elevated PA and central venous pressures, prompting [place] to add furosemide.  Today, [name] reports that he has been feeling quite well.  He denies chest pain, shortness of breath, palpitations, and lightheadedness.  He still has slight swelling of his legs.  He continues to have urinary frequency both during the day and at night, which has not changed much with addition of furosemide.  He is concerned about some labile blood pressures.  Blood pressure tends to be highest when he first wakes up in the morning despite taking amlodipine at bedtime.  Later in the day, his blood pressure readings are often in the low normal range.  Of note, he uses a wrist cuff to monitor his blood pressure.  He remains compliant with his medications, including apixaban.  --------------------------------------------------------------------------------------------------  Past Medical History:  Diagnosis Date   Anxiety    Atrial flutter ([place]) 01/07/2018    Post-op after CABG   Basal cell carcinoma 09/12/2018   Left forehead above lateral brow. Nodular pattern   Cancer ([place])    Coronary artery disease 2003   a. nonobstructive CAD by cath in 2003 and 2005; b.  Status post three-vessel CABG on 12/12/2017 with LIMA to LAD, sequential reverse SVG to OM1 and distal LCx   COVID-19 03/2020   Diverticulosis    Hearing loss    bilateral   Heart disease    Heart murmur    History of benign prostatic hypertrophy    History of radiation therapy 01/31/10-03/22/10   right tonsil/right neck node 7000 cGy 35 sessions, high risk lymph node volume 5940 cGy 35 sessions, low risk lymph node vol 5600 cGy 35 sessions   HPV in male    positive   HTN (hypertension)    Hyperlipidemia    diet controlled- taking medication d/t ensure drinking   Hypothyroid 01/16/2013   Ischemic cardiomyopathy    a. 10/2017: echo showing reduced EF of 40-45%, HK of the anterior, anteroseptal and apical myocardium with Grade 1 DD.    Neck pain    Squamous cell carcinoma    right tonsil- 2011   Squamous cell carcinoma of skin 09/02/2019   Crown scalp. WD SCC   Squamous cell carcinoma of skin 09/02/2019   Left distal lateral deltoid. SCCis arising in SK   Thrombocytopenia ([place]) 02/02/2012   unclear etiology   Tinnitus    Tubular adenoma of colon    Past Surgical History:  Procedure Laterality Date   artoscopic knee     CARDIAC CATHETERIZATION  2003, 2005   40% blockage, two 30% blockages treated medically (  Phillip [name])   CARDIOVERSION N/A 02/12/2018   Procedure: CARDIOVERSION;  Surgeon: [name], MD;  Location: [facility];  Service: Cardiovascular;  Laterality: N/A;   CARDIOVERSION N/A 05/10/2021   Procedure: CARDIOVERSION;  Surgeon: [name], MD;  Location: [facility];  Service: Cardiovascular;  Laterality: N/A;   COLONOSCOPY  03/2015   TA x3, HP, melanosis coli, severe diverticulosis, rpt 3 yrs ([place])   COLONOSCOPY  01/2019   fair prep, diverticulosis, f/u PRN  ([place])   CORONARY ARTERY BYPASS GRAFT N/A 12/12/2017   Procedure: CORONARY ARTERY BYPASS GRAFTING (CABG) x3 using the right greater saphenous vein harvested endoscopically and the left internal mammary artery. LIMA to LAD, SEQ SVG to OM1 & OM2;  Surgeon: [name], MD;  Location: [facility];  Service: Open Heart Surgery;  Laterality: N/A;   DENTAL SURGERY     extractions   IR THORACENTESIS ASP PLEURAL SPACE W/IMG GUIDE  01/17/2018   KNEE ARTHROSCOPY Left    LEFT HEART CATH AND CORONARY ANGIOGRAPHY N/A 12/10/2017   Procedure: LEFT HEART CATH AND CORONARY ANGIOGRAPHY;  Surgeon: [name], MD;  Location: [facility];  Service: Cardiovascular;  Laterality: N/A;   PEG PLACEMENT  02/2010   TEE WITHOUT CARDIOVERSION N/A 12/12/2017   Procedure: TRANSESOPHAGEAL ECHOCARDIOGRAM (TEE);  Surgeon: [name], MD;  Location: [facility];  Service: Open Heart Surgery;  Laterality: N/A;   TONSILLECTOMY     triple bypass  11/2017   VASECTOMY      Current Meds  Medication Sig   amLODipine (NORVASC) 10 MG tablet Take 1 tablet (10 mg total) by mouth daily.   apixaban (ELIQUIS) 5 MG TABS tablet Take 1 tablet (5 mg total) by mouth 2 (two) times daily.   Cholecalciferol (VITAMIN D3) 25 MCG (1000 UT) CAPS Take 1 capsule by mouth daily.   docusate sodium (COLACE) 250 MG capsule Takes total 600mg  /day   furosemide (LASIX) 20 MG tablet Take 1 tablet (20 mg total) by mouth daily.   levothyroxine (EUTHYROX) 50 MCG tablet Take 1 tablet (50 mcg total) by mouth daily before breakfast.   MAGNESIUM PO Take 1 tablet by mouth daily.   Multiple Vitamins-Minerals (ZINC PO) Take by mouth daily.   nitroGLYCERIN (NITROSTAT) 0.4 MG SL tablet Place 1 tablet (0.4 mg total) under the tongue every 5 (five) minutes as needed for chest pain (Maximum 3 doses.).   Omega-3 Fatty Acids (OMEGA 3 PO) Take by mouth daily.   ondansetron (ZOFRAN-ODT) 4 MG disintegrating tablet Take 1 tablet (4 mg total) by mouth every 8 (eight)  hours as needed for nausea or vomiting.   polyethylene glycol (MIRALAX / GLYCOLAX) 17 g packet Take 17 g by mouth daily as needed.   Potassium 99 MG TABS Take 1 tablet by mouth daily.   rosuvastatin (CRESTOR) 20 MG tablet Take 1 tablet by mouth once daily   sertraline (ZOLOFT) 25 MG tablet Take 1 tablet (25 mg total) by mouth daily.   VITAMIN A PO Take 2,400 mcg by mouth daily.    vitamin E 400 UNIT capsule Take 400 Units by mouth daily.   zaleplon (SONATA) 10 MG capsule Take 1 capsule (10 mg total) by mouth at bedtime as needed for sleep.   Zinc 50 MG CAPS Take 50 mg by mouth daily.    Allergies: Ace inhibitors and Morphine and related  Social History   Tobacco Use   Smoking status: Never   Smokeless tobacco: Never  Vaping Use   Vaping Use:  Never used  Substance Use Topics   Alcohol use: Yes    Comment: glass of wine 1-2 times a month   Drug use: No    Family History  Problem Relation Age of Onset   Coronary artery disease Father    Heart attack Father 61   Heart disease Father    Stroke Mother    Colon cancer Neg Hx    Esophageal cancer Neg Hx    Rectal cancer Neg Hx    Stomach cancer Neg Hx    Kidney cancer Neg Hx    Kidney failure Neg Hx    Prostate cancer Neg Hx    Tuberculosis Neg Hx     Review of Systems: A 12-system review of systems was performed and was negative except as noted in the HPI.  --------------------------------------------------------------------------------------------------  Physical Exam: BP 120/74 (BP Location: Left Arm, Patient Position: Sitting, Cuff Size: Large)   Pulse 66   Ht 6\' 1"  (1.854 m)   Wt 199 lb (90.3 kg)   SpO2 97%   BMI 26.25 kg/m   General:  NAD. Neck: No JVD or HJR. Lungs: Clear to auscultation bilaterally without wheezes or crackles. Heart: Regular rate and rhythm without murmurs, rubs, or gallops. Abdomen: Soft, nontender, nondistended. Extremities: Trace pretibial edema noted bilaterally.  EKG: Normal sinus  rhythm with left axis deviation and LVH with abnormal repolarization.  Compared with prior tracing from 05/10/2021, heart rate has increased.  Otherwise, no significant interval change.  Lab Results  Component Value Date   WBC 6.0 04/13/2021   HGB 16.9 04/13/2021   HCT 49.9 04/13/2021   MCV 98 (H) 04/13/2021   PLT 131 (L) 04/13/2021    Lab Results  Component Value Date   NA 142 05/13/2021   K 4.4 05/13/2021   CL 103 05/13/2021   CO2 24 05/13/2021   BUN 15 05/13/2021   CREATININE 0.77 05/13/2021   GLUCOSE 117 (H) 05/13/2021   ALT 24 04/13/2021    Lab Results  Component Value Date   CHOL 102 04/26/2021   HDL 38.60 (L) 04/26/2021   LDLCALC 50 04/26/2021   LDLDIRECT 101.0 10/13/2014   TRIG 67.0 04/26/2021   CHOLHDL 3 04/26/2021    --------------------------------------------------------------------------------------------------  ASSESSMENT AND PLAN: Persistent atrial fibrillation: Mr. Vansickle is maintaining sinus rhythm after his cardioversion last month.  Given his resting heart rate in the 60s, we will defer restarting a beta-blocker at this time.  He will remain on indefinite anticoagulation with apixaban.  Coronary artery disease: No angina reported.  Continue current medications for secondary prevention, including apixaban in lieu of aspirin.  Chronic HFpEF and ischemic cardiomyopathy: Mr. Kolodziejski is asymptomatic (NYHA class I) with only trace edema on examination today.  We will continue furosemide 20 mg daily given some edema noted again today as well as recent echo showing elevated central venous and pulmonary artery pressures.  I will defer Rie challenging him with a beta-blocker given bradycardia following cardioversion and resting heart rate in the 60s again today.  We will also defer challenging with an ARB given history of angioedema with ACE inhibitor's.  Hypertension: Blood pressure well controlled today.  Home readings have been somewhat labile.  We have  discussed the potential for inaccurate measurements from his wrist cuff at home and will defer medication changes today.  Mr. Smoak will continue to measure his blood pressure regularly at home with an upper arm cuff and provide [place] with readings in about 2 weeks.  Hyperlipidemia: Lipids well  controlled on last check.  Continue current dose of rosuvastatin.  Follow-up: Return to clinic in 3 months.  [name], MD 05/27/2021 8:45 AM

## 2021-05-27 NOTE — Patient Instructions (Addendum)
Medication Instructions:   Your physician recommends that you continue on your current medications as directed. Please refer to the Current Medication list given to you today.  *If you need a refill on your cardiac medications before your next appointment, please call your pharmacy*   Lab Work:  None ordered  Testing/Procedures:  None ordered   Follow-Up: At [facility], you and your health needs are our priority.  As part of our continuing mission to provide you with exceptional heart care, we have created designated Provider Care Teams.  These Care Teams include your primary Cardiologist (physician) and Advanced Practice Providers (APPs -  Physician Assistants and Nurse Practitioners) who all work together to provide you with the care you need, when you need it.  We recommend signing up for the patient portal called "MyChart".  Sign up information is provided on this After Visit Summary.  MyChart is used to connect with patients for Virtual Visits (Telemedicine).  Patients are able to view lab/test results, encounter notes, upcoming appointments, etc.  Non-urgent messages can be sent to your provider as well.   To learn more about what you can do with MyChart, go to [email or web address].    Your next appointment:   3 month(s)  The format for your next appointment:   In Person  Provider:   You may see [name], MD or one of the following Advanced Practice Providers on your designated Care Team:   Murray Hodgkins, NP Christell Faith, PA-C Marrianne Mood, PA-C Cadence Kathlen Mody, [place]   Other Instructions  - Please monitor your blood pressure at home with an upper arm cuff for TWO WEEKS. - Please contact our office via [facility] to let us know what your BP readings have been for the past 2 weeks.

## 2021-05-28 ENCOUNTER — Encounter: Payer: Self-pay | Admitting: Internal Medicine

## 2021-05-28 DIAGNOSIS — I5032 Chronic diastolic (congestive) heart failure: Secondary | ICD-10-CM | POA: Insufficient documentation

## 2021-06-22 ENCOUNTER — Telehealth: Payer: Self-pay | Admitting: Internal Medicine

## 2021-06-22 NOTE — Telephone Encounter (Signed)
Per last ov with Dr. Saunders Revel 05/27/21: Pt was instructed to measure his blood pressure regularly at home with an upper arm cuff and provide [place] with readings in about 2 weeks.  10/8 (8 AM)  161/92  (12 PM)  108/69 10/9 (730 AM) 177/96 10/10 (830 AM)  157/96   10/11 (630 AM)  175/100 10/12 (730 AM) 159/92 10/13 (830 AM)  160/88 10/14 (730 AM) 158/90 10/15 (830 AM) 150/88 10/16 (9 AM)  150/96 10/17 (8 AM)  168/97 10/18 (8 AM) 164/82 10/19 (830 AM)  178/92  (10 PM) 181/95 10/20 (830 AM)  135/85  (10 PM) 151/81 10/21 (8 AM) 179/97 10/22 (845 AM) 159/88 10/23 (830 AM)166/97  Called pt to verify if BP was taken ~2 hours after morning medication.  Pt's wife states that BP readings are all taken before morning medications. Does not have readings after BP meds taken.  Advised wife/pt to take BP with upper arm cuff ~2 hours after AM medication for at least a week and can send these readings via MyChart or may call me back.

## 2021-06-22 NOTE — Telephone Encounter (Signed)
Dr End requested BP readings on arm and wrist for ~ 2 weeks Patient came by office and dropped off readings Placed in nurse box  Please call to discuss

## 2021-07-25 ENCOUNTER — Other Ambulatory Visit: Payer: Self-pay | Admitting: Internal Medicine

## 2021-08-18 ENCOUNTER — Other Ambulatory Visit: Payer: Self-pay | Admitting: Family Medicine

## 2021-08-21 DIAGNOSIS — C4402 Squamous cell carcinoma of skin of lip: Secondary | ICD-10-CM

## 2021-08-21 HISTORY — DX: Squamous cell carcinoma of skin of lip: C44.02

## 2021-08-24 DIAGNOSIS — H2513 Age-related nuclear cataract, bilateral: Secondary | ICD-10-CM | POA: Diagnosis not present

## 2021-09-01 ENCOUNTER — Telehealth: Payer: Self-pay | Admitting: Family Medicine

## 2021-09-01 MED ORDER — LEVOTHYROXINE SODIUM 50 MCG PO TABS: 50.0000 ug | ORAL_TABLET | Freq: Every day | ORAL | 8 refills | Status: DC

## 2021-09-01 NOTE — Telephone Encounter (Signed)
1.Medication Requested: levothyroxine (EUTHYROX) 50 MCG tablet  2. Pharmacy (Name, Street, [place]):  [facility], [place] Phone:  [phone]  Fax:  [phone]      3. On Med List: Y  4. Last Visit with PCP: 9.12.22  5. Next visit date with PCP: 3.15.23  Patient states that he is down to two pills of his thyroid medicine and needs a new prescription

## 2021-09-01 NOTE — Telephone Encounter (Signed)
E-scribed refill 

## 2021-09-01 NOTE — Telephone Encounter (Signed)
Patient states his current ENT is retiring and he would like to have 2 ENT suggestions to transfer care

## 2021-09-01 NOTE — Telephone Encounter (Signed)
Would he prefer [place] or [place]? If [employer], [facility]. If [place], consider Dr Wilburn Cornelia. Let us know if he needs referral from [place].

## 2021-09-02 ENCOUNTER — Encounter: Payer: Self-pay | Admitting: Internal Medicine

## 2021-09-02 ENCOUNTER — Ambulatory Visit: Payer: PPO | Admitting: Internal Medicine

## 2021-09-02 ENCOUNTER — Other Ambulatory Visit: Payer: Self-pay

## 2021-09-02 VITALS — BP 120/76 | HR 62 | Ht 74.0 in | Wt 193.0 lb

## 2021-09-02 DIAGNOSIS — I2510 Atherosclerotic heart disease of native coronary artery without angina pectoris: Secondary | ICD-10-CM | POA: Diagnosis not present

## 2021-09-02 DIAGNOSIS — I4819 Other persistent atrial fibrillation: Secondary | ICD-10-CM

## 2021-09-02 DIAGNOSIS — I5032 Chronic diastolic (congestive) heart failure: Secondary | ICD-10-CM

## 2021-09-02 DIAGNOSIS — I255 Ischemic cardiomyopathy: Secondary | ICD-10-CM | POA: Diagnosis not present

## 2021-09-02 DIAGNOSIS — E785 Hyperlipidemia, unspecified: Secondary | ICD-10-CM

## 2021-09-02 DIAGNOSIS — I10 Essential (primary) hypertension: Secondary | ICD-10-CM | POA: Diagnosis not present

## 2021-09-02 NOTE — Patient Instructions (Signed)
Medication Instructions:   Your physician recommends that you continue on your current medications as directed. Please refer to the Current Medication list given to you today.  *If you need a refill on your cardiac medications before your next appointment, please call your pharmacy*   Lab Work:  None ordered  Testing/Procedures:  None ordered   Follow-Up: At [facility], you and your health needs are our priority.  As part of our continuing mission to provide you with exceptional heart care, we have created designated Provider Care Teams.  These Care Teams include your primary Cardiologist (physician) and Advanced Practice Providers (APPs -  Physician Assistants and Nurse Practitioners) who all work together to provide you with the care you need, when you need it.  We recommend signing up for the patient portal called "MyChart".  Sign up information is provided on this After Visit Summary.  MyChart is used to connect with patients for Virtual Visits (Telemedicine).  Patients are able to view lab/test results, encounter notes, upcoming appointments, etc.  Non-urgent messages can be sent to your provider as well.   To learn more about what you can do with MyChart, go to [email or web address].    Your next appointment:   6 month(s)  The format for your next appointment:   In Person  Provider:   You may see [name], MD or one of the following Advanced Practice Providers on your designated Care Team:   Murray Hodgkins, NP Christell Faith, PA-C Cadence Kathlen Mody, [place]

## 2021-09-02 NOTE — Telephone Encounter (Signed)
Lvm asking pt to call back.  Need to relay Dr. G's message.  

## 2021-09-02 NOTE — Progress Notes (Signed)
Follow-up Outpatient Visit Date: 09/02/2021  Primary Care Provider: [name], MD [place] [address]  Chief Complaint: Follow-up CAD, cardiomyopathy, and atrial fibrillation  HPI:  Phillip [name] is a 81 y.o. male with history of coronary artery disease status post CABG in 11/4816, chronic systolic heart failure secondary to ischemic cardiomyopathy, persistent atrial fibrillation, hypertension, hyperlipidemia, and tonsillar cancer, who presents for follow-up of coronary artery disease, ischemic cardiomyopathy, and atrial fibrillation.  I last saw Phillip [name] in early October following cardioversion.  He was feeling quite well at the time, though he continued to note some leg edema as well as urinary frequency.  He also noted labile blood pressures at home.  He was maintaining sinus rhythm.  Today, Phillip [name] reports that he is feeling fairly well.  Home blood pressures have been labile, ranging mostly 130-180/80-100, though he has had some low normal readings at times.  He denies chest pain, shortness of breath, palpitations, or lightheadedness.  He has minimal edema, which seems to be well controlled.  He still gets tired towards the [place] of a round of golf but thinks that playing less golf this time a year may be contributing.  Phillip [name] has chronic irritation along the inside of his right cheek that has been present since radiation for tonsillar cancer several years ago.  He has started eating more solid foods and is concerned about problems with his cheek.  He previously followed with Dr. Lucia Gaskins (ENT) in [place] but is interested in establishing with a new ENT provider with Dr. Pollie Friar retirement.  --------------------------------------------------------------------------------------------------  Past Medical History:  Diagnosis Date   Anxiety    Basal cell carcinoma 09/12/2018   Left forehead above lateral brow. Nodular pattern   Cancer ([facility])    Coronary  artery disease 2003   a. nonobstructive CAD by cath in 2003 and 2005; b.  Status post three-vessel CABG on 12/12/2017 with LIMA to LAD, sequential reverse SVG to OM1 and distal LCx   COVID-19 03/2020   Diverticulosis    Hearing loss    bilateral   Heart disease    Heart murmur    History of benign prostatic hypertrophy    History of radiation therapy 01/31/10-03/22/10   right tonsil/right neck node 7000 cGy 35 sessions, high risk lymph node volume 5940 cGy 35 sessions, low risk lymph node vol 5600 cGy 35 sessions   HPV in male    positive   HTN (hypertension)    Hyperlipidemia    diet controlled- taking medication d/t ensure drinking   Hypothyroid 01/16/2013   Ischemic cardiomyopathy    a. 10/2017: echo showing reduced EF of 40-45%, HK of the anterior, anteroseptal and apical myocardium with Grade 1 DD.    Neck pain    Persistent atrial fibrillation ([facility])    Squamous cell carcinoma    right tonsil- 2011   Squamous cell carcinoma of skin 09/02/2019   Crown scalp. WD SCC   Squamous cell carcinoma of skin 09/02/2019   Left distal lateral deltoid. SCCis arising in SK   Thrombocytopenia ([facility]) 02/02/2012   unclear etiology   Tinnitus    Tubular adenoma of colon    Past Surgical History:  Procedure Laterality Date   artoscopic knee     CARDIAC CATHETERIZATION  2003, 2005   40% blockage, two 30% blockages treated medically Ron Parker)   CARDIOVERSION N/A 02/12/2018   Procedure: CARDIOVERSION;  Surgeon: [name], MD;  Location: [facility];  Service: Cardiovascular;  Laterality: N/A;   CARDIOVERSION N/A 05/10/2021   Procedure: CARDIOVERSION;  Surgeon: [name], MD;  Location: [facility];  Service: Cardiovascular;  Laterality: N/A;   COLONOSCOPY  03/2015   TA x3, HP, melanosis coli, severe diverticulosis, rpt 3 yrs ([facility])   COLONOSCOPY  01/2019   fair prep, diverticulosis, f/u PRN ([facility])   CORONARY ARTERY BYPASS GRAFT N/A 12/12/2017   Procedure: CORONARY ARTERY BYPASS GRAFTING  (CABG) x3 using the right greater saphenous vein harvested endoscopically and the left internal mammary artery. LIMA to LAD, SEQ SVG to OM1 & OM2;  Surgeon: [name], MD;  Location: [facility];  Service: Open Heart Surgery;  Laterality: N/A;   DENTAL SURGERY     extractions   IR THORACENTESIS ASP PLEURAL SPACE W/IMG GUIDE  01/17/2018   KNEE ARTHROSCOPY Left    LEFT HEART CATH AND CORONARY ANGIOGRAPHY N/A 12/10/2017   Procedure: LEFT HEART CATH AND CORONARY ANGIOGRAPHY;  Surgeon: [name], MD;  Location: [facility];  Service: Cardiovascular;  Laterality: N/A;   PEG PLACEMENT  02/2010   TEE WITHOUT CARDIOVERSION N/A 12/12/2017   Procedure: TRANSESOPHAGEAL ECHOCARDIOGRAM (TEE);  Surgeon: [name], MD;  Location: [facility];  Service: Open Heart Surgery;  Laterality: N/A;   TONSILLECTOMY     triple bypass  11/2017   VASECTOMY       Current Meds  Medication Sig   amLODipine (NORVASC) 10 MG tablet Take 1 tablet (10 mg total) by mouth daily.   apixaban (ELIQUIS) 5 MG TABS tablet Take 1 tablet (5 mg total) by mouth 2 (two) times daily.   Cholecalciferol (VITAMIN D3) 25 MCG (1000 UT) CAPS Take 1 capsule by mouth daily.   docusate sodium (COLACE) 250 MG capsule Takes total 600mg  /day   furosemide (LASIX) 20 MG tablet Take 1 tablet (20 mg total) by mouth daily.   levothyroxine (EUTHYROX) 50 MCG tablet Take 1 tablet (50 mcg total) by mouth daily before breakfast.   MAGNESIUM PO Take 1 tablet by mouth daily.   Multiple Vitamins-Minerals (ZINC PO) Take by mouth daily.   nitroGLYCERIN (NITROSTAT) 0.4 MG SL tablet Place 1 tablet (0.4 mg total) under the tongue every 5 (five) minutes as needed for chest pain (Maximum 3 doses.).   Omega-3 Fatty Acids (OMEGA 3 PO) Take by mouth daily.   ondansetron (ZOFRAN-ODT) 4 MG disintegrating tablet Take 1 tablet (4 mg total) by mouth every 8 (eight) hours as needed for nausea or vomiting.   polyethylene glycol (MIRALAX / GLYCOLAX) 17 g packet  Take 17 g by mouth daily as needed.   Potassium 99 MG TABS Take 1 tablet by mouth daily.   rosuvastatin (CRESTOR) 20 MG tablet Take 1 tablet by mouth once daily   sertraline (ZOLOFT) 25 MG tablet Take 1 tablet by mouth once daily   VITAMIN A PO Take 2,400 mcg by mouth daily.    vitamin E 400 UNIT capsule Take 400 Units by mouth daily.   zaleplon (SONATA) 10 MG capsule Take 1 capsule (10 mg total) by mouth at bedtime as needed for sleep.   Zinc 50 MG CAPS Take 50 mg by mouth daily.    Allergies: Ace inhibitors and Morphine and related  Social History   Tobacco Use   Smoking status: Never   Smokeless tobacco: Never  Vaping Use   Vaping Use: Never used  Substance Use Topics   Alcohol use: Yes    Comment: glass of wine 1-2 times a month  Drug use: No    Family History  Problem Relation Age of Onset   Coronary artery disease Father    Heart attack Father 60   Heart disease Father    Stroke Mother    Colon cancer Neg Hx    Esophageal cancer Neg Hx    Rectal cancer Neg Hx    Stomach cancer Neg Hx    Kidney cancer Neg Hx    Kidney failure Neg Hx    Prostate cancer Neg Hx    Tuberculosis Neg Hx     Review of Systems: A 12-system review of systems was performed and was negative except as noted in the HPI.  --------------------------------------------------------------------------------------------------  Physical Exam: BP 120/76 (BP Location: Left Arm, Patient Position: Sitting, Cuff Size: Normal)    Pulse 62    Ht 6\' 2"  (1.88 m)    Wt 193 lb (87.5 kg)    SpO2 97%    BMI 24.78 kg/m   General:  NAD. Neck: No JVD or HJR. Lungs: Clear to auscultation bilaterally without wheezes or crackles. Heart: Regular rate and rhythm without murmurs, rubs, or gallops. Abdomen: Soft, nontender, nondistended. Extremities: Trace pretibial edema.  EKG: Normal sinus rhythm with left axis deviation and LVH with abnormal repolarization.  No significant change from prior tracing on  05/27/2021.  Lab Results  Component Value Date   WBC 6.0 04/13/2021   HGB 16.9 04/13/2021   HCT 49.9 04/13/2021   MCV 98 (H) 04/13/2021   PLT 131 (L) 04/13/2021    Lab Results  Component Value Date   NA 142 05/13/2021   K 4.4 05/13/2021   CL 103 05/13/2021   CO2 24 05/13/2021   BUN 15 05/13/2021   CREATININE 0.77 05/13/2021   GLUCOSE 117 (H) 05/13/2021   ALT 24 04/13/2021    Lab Results  Component Value Date   CHOL 102 04/26/2021   HDL 38.60 (L) 04/26/2021   LDLCALC 50 04/26/2021   LDLDIRECT 101.0 10/13/2014   TRIG 67.0 04/26/2021   CHOLHDL 3 04/26/2021    --------------------------------------------------------------------------------------------------  ASSESSMENT AND PLAN: Coronary artery disease: No angina reported.  Continue secondary prevention, including apixaban and lieu of aspirin in the setting of persistent atrial fibrillation.  Chronic HFpEF foot and ischemic cardiomyopathy: Phillip [name] has minimal leg edema, which has been chronic.  He endorses stable NYHA class II symptoms.  Continue low-dose furosemide.  Defer beta-blocker in the setting of low normal resting heart rate.  Defer Rie challenging with ACE inhibitor or ARB with concern for angioedema in the past with ACE inhibitors.  Persistent atrial fibrillation: Phillip [name] is maintaining sinus rhythm following cardioversion last year.  We will plan for indefinite anticoagulation with apixaban.  Continue to defer AV nodal blocking agents in the setting of low normal resting heart rate.  Hypertension: Blood pressure labile at home but well controlled in the office again today.  Continue current dose of amlodipine 10 mg nightly.  Hyperlipidemia: Lipids well controlled other than slightly low HDL on last check in September.  Continue rosuvastatin and fish oil.  Buccal lesion: Phillip [name] is concerned about longstanding irritation of his cheek following tonsillar radiation.  He is no longer able to consume a  mostly liquid diet due to supply issues.  He is concerned about more buccal irritation with solid foods.  He would like to establish with a new ENT provider in light of Dr. Pollie Friar retirement.  I have provided him with contact information for [facility].  Follow-up: Return  to clinic in 6 months.  [name], MD 09/02/2021 10:02 AM

## 2021-09-05 NOTE — Telephone Encounter (Signed)
Lvm asking pt to call back.  Need to relay Dr. G's message.  

## 2021-09-05 NOTE — Telephone Encounter (Signed)
Pt rtn call.  I relayed Dr. Synthia Innocent message.  Pt verbalizes understanding and states he found a doc at [facility].  Pt expresses his thanks.

## 2021-09-14 DIAGNOSIS — K121 Other forms of stomatitis: Secondary | ICD-10-CM | POA: Diagnosis not present

## 2021-09-19 ENCOUNTER — Encounter: Payer: PPO | Admitting: Dermatology

## 2021-09-28 ENCOUNTER — Other Ambulatory Visit: Payer: Self-pay | Admitting: Unknown Physician Specialty

## 2021-09-28 DIAGNOSIS — C061 Malignant neoplasm of vestibule of mouth: Secondary | ICD-10-CM | POA: Diagnosis not present

## 2021-09-28 DIAGNOSIS — Z85818 Personal history of malignant neoplasm of other sites of lip, oral cavity, and pharynx: Secondary | ICD-10-CM | POA: Diagnosis not present

## 2021-09-28 DIAGNOSIS — K1239 Other oral mucositis (ulcerative): Secondary | ICD-10-CM | POA: Diagnosis not present

## 2021-09-30 ENCOUNTER — Other Ambulatory Visit: Payer: Self-pay | Admitting: Pathology

## 2021-09-30 LAB — SURGICAL PATHOLOGY

## 2021-10-06 ENCOUNTER — Other Ambulatory Visit: Payer: PPO

## 2021-10-06 NOTE — Progress Notes (Signed)
Tumor Board Documentation  Phillip [name] was presented by Frankey Shown, RN, OCN at our Tumor Board on 10/06/2021, which included representatives from medical oncology, radiology, pathology, palliative care, research, pulmonology, surgical, navigation, internal medicine, radiation oncology.  Phillip [name] currently presents as an external consult, for [place], for new positive pathology with history of the following treatments: surgical intervention(s), adjuvant radiation.  Additionally, we reviewed previous medical and familial history, history of present illness, and recent lab results along with all available histopathologic and imaging studies. The tumor board considered available treatment options and made the following recommendations:   Has conultation with Med Onc, Per Dr Baruch Gouty, Radiation Therapy can be offered  The following procedures/referrals were also placed: No orders of the defined types were placed in this encounter.   Clinical Trial Status: not discussed   Staging used: To be determined. AJCC Staging:       Group: Invasive Squamous Cell Carcinoma of Oral lesion      History of Right Tonsilar Carcinoma 8 years ago   National site-specific guidelines   were discussed with respect to the case.  Tumor board is a meeting of clinicians from various specialty areas who evaluate and discuss patients for whom a multidisciplinary approach is being considered. Final determinations in the plan of care are those of the provider(s). The responsibility for follow up of recommendations given during tumor board is that of the provider.   Todays extended care, comprehensive team conference, Phillip [name] was not present for the discussion and was not examined.   Multidisciplinary Tumor Board is a multidisciplinary case peer review process.  Decisions discussed in the Multidisciplinary Tumor Board reflect the opinions of the specialists present at the conference without having examined the patient.   Ultimately, treatment and diagnostic decisions rest with the primary provider(s) and the patient.

## 2021-10-10 ENCOUNTER — Encounter: Payer: Self-pay | Admitting: Oncology

## 2021-10-10 ENCOUNTER — Other Ambulatory Visit: Payer: Self-pay

## 2021-10-10 ENCOUNTER — Inpatient Hospital Stay: Payer: PPO

## 2021-10-10 ENCOUNTER — Inpatient Hospital Stay: Payer: PPO | Attending: Oncology | Admitting: Oncology

## 2021-10-10 VITALS — BP 139/81 | HR 50 | Temp 96.8°F | Resp 18

## 2021-10-10 DIAGNOSIS — Z923 Personal history of irradiation: Secondary | ICD-10-CM | POA: Diagnosis not present

## 2021-10-10 DIAGNOSIS — Z85818 Personal history of malignant neoplasm of other sites of lip, oral cavity, and pharynx: Secondary | ICD-10-CM | POA: Diagnosis not present

## 2021-10-10 DIAGNOSIS — G893 Neoplasm related pain (acute) (chronic): Secondary | ICD-10-CM | POA: Insufficient documentation

## 2021-10-10 DIAGNOSIS — C029 Malignant neoplasm of tongue, unspecified: Secondary | ICD-10-CM

## 2021-10-10 DIAGNOSIS — Z7189 Other specified counseling: Secondary | ICD-10-CM

## 2021-10-10 DIAGNOSIS — C060 Malignant neoplasm of cheek mucosa: Secondary | ICD-10-CM | POA: Insufficient documentation

## 2021-10-10 MED ORDER — TRAMADOL HCL 50 MG PO TABS: 50.0000 mg | ORAL_TABLET | Freq: Four times a day (QID) | ORAL | 0 refills | Status: DC | PRN

## 2021-10-10 NOTE — Progress Notes (Signed)
Patient here for oncology follow-up appointment, concerns of trouble chewing, pain 9/10 and urinary difficulty

## 2021-10-11 ENCOUNTER — Other Ambulatory Visit: Payer: Self-pay | Admitting: Oncology

## 2021-10-11 NOTE — Telephone Encounter (Signed)
Ok to do both zoloft and tramadol. But if patient has symptoms such as tremors, mental status changes etc, stop tramadol and inform us

## 2021-10-13 ENCOUNTER — Encounter: Payer: Self-pay | Admitting: Oncology

## 2021-10-16 ENCOUNTER — Encounter: Payer: Self-pay | Admitting: Oncology

## 2021-10-16 NOTE — Progress Notes (Signed)
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 Aundra Dubin  Date of visit: 10/16/21   History of presenting illness- Patient is a 81 year old male with a past medical history significant for tonsillar cancer in the past treated with radiation treatment in 2011.  He presented to ENT with symptoms of her right cheek mucosal irritation which did not get better after topical remedies.  This was ultimately biopsied by Dr. Aundra Dubin and came back positive for squamous cell carcinoma while differentiated and keratinizing.  This has not been deemed to be resectable by ENT and therefore referred for consideration of radiation or chemotherapy.  Patient currently reports intense pain in the region of the ulcer.  He is using Tylenol which does not help him with the pain.  He has been eating with difficulty and has lost about 6 pounds in the last 4 months. ECOG PS- 1  Pain scale- 6   Review of systems- Review of Systems  Constitutional:  Negative for chills, fever, malaise/fatigue and weight loss.  HENT:  Negative for congestion, ear discharge and nosebleeds.        Right-sided mouth pain  Eyes:  Negative for blurred vision.  Respiratory:  Negative for cough, hemoptysis, sputum production, shortness of breath and wheezing.   Cardiovascular:  Negative for chest pain, palpitations, orthopnea and claudication.   Gastrointestinal:  Negative for abdominal pain, blood in stool, constipation, diarrhea, heartburn, melena, nausea and vomiting.  Genitourinary:  Negative for dysuria, flank pain, frequency, hematuria and urgency.  Musculoskeletal:  Negative for back pain, joint pain and myalgias.  Skin:  Negative for rash.  Neurological:  Negative for dizziness, tingling, focal weakness, seizures, weakness and headaches.  Endo/Heme/Allergies:  Does not bruise/bleed easily.  Psychiatric/Behavioral:  Negative for depression and suicidal ideas. The patient does not have insomnia.    Allergies  Allergen Reactions   Ace Inhibitors Swelling and Other (See Comments)    Possible angioedema (upper lip swelling)   Morphine And Related Swelling    SWELLING REACTION UNSPECIFIED  [severity rated per PMH, 12/11/2017]    Patient Active Problem List   Diagnosis Date Noted   Chronic heart failure with preserved ejection fraction (HFpEF) ([place]) 05/28/2021   Atrial fibrillation ([place]) 04/06/2021   Postural dizziness with presyncope 04/06/2021   Irritability and anger 10/30/2020   Medicare annual wellness visit, subsequent 04/28/2019   Health maintenance examination 04/28/2019   Chronic constipation 12/45/8099   Chronic systolic heart failure ([place]) 03/20/2018   Ischemic cardiomyopathy 03/20/2018   Typical atrial flutter ([place]) 01/07/2018   Decreased appetite 01/07/2018   Chronic bilateral pleural effusions 01/07/2018   Insomnia 12/21/2017   S/P CABG x 3 12/12/2017   Detrusor instability of bladder 10/31/2017   LVH (left ventricular hypertrophy) 10/24/2017   Angioedema 06/19/2017   Adverse effect of radiation 12/05/2016   Advanced care planning/counseling discussion 83/38/2505   Systolic murmur 39/76/7341   Bilateral sensorineural hearing loss 02/03/2016   Hyperlipidemia LDL goal <70 10/24/2015   Prediabetes 08/20/2015   Nail fungal  infection 12/15/2013   Right leg pain 11/05/2013   Impotence, organic 11/04/2013    Bilateral carotid artery stenosis 11/04/2013   Hypothyroidism, acquired 01/16/2013   Thrombocytopenia ([place]) 02/02/2012   Decreased libido 06/23/2010   History of cancer tonsil 03/28/2010   SINUS BRADYCARDIA 09/04/2008   Essential hypertension 06/24/2007   Coronary artery disease involving native coronary artery of native heart without angina pectoris 06/24/2007   NECK PAIN 06/24/2007   BPH associated with nocturia 06/24/2007     Past Medical History:  Diagnosis Date   Anxiety    Basal cell carcinoma 09/12/2018   Left forehead above lateral brow. Nodular pattern   Cancer ([place])    Coronary artery disease 2003   a. nonobstructive CAD by cath in 2003 and 2005; b.  Status post three-vessel CABG on 12/12/2017 with LIMA to LAD, sequential reverse SVG to OM1 and distal LCx   COVID-19 03/2020   Diverticulosis    Hearing loss    bilateral   Heart disease    Heart murmur    History of benign prostatic hypertrophy    History of radiation therapy 01/31/10-03/22/10   right tonsil/right neck node 7000 cGy 35 sessions, high risk lymph node volume 5940 cGy 35 sessions, low risk lymph node vol 5600 cGy 35 sessions   HPV in male    positive   HTN (hypertension)    Hyperlipidemia    diet controlled- taking medication d/t ensure drinking   Hypothyroid 01/16/2013   Ischemic cardiomyopathy    a. 10/2017: echo showing reduced EF of 40-45%, HK of the anterior, anteroseptal and apical myocardium with Grade 1 DD.    Neck pain    Persistent atrial fibrillation (HCC)    Primary squamous cell carcinoma of skin of lip 2023   lip and tonuge   Squamous cell carcinoma    right tonsil- 2011   Squamous cell carcinoma of skin 09/02/2019   Crown scalp. WD SCC   Squamous cell carcinoma of skin 09/02/2019   Left distal lateral deltoid. SCCis arising in SK   Thrombocytopenia ([place]) 02/02/2012   unclear etiology   Tinnitus    Tubular adenoma of colon      Past Surgical History:  Procedure Laterality Date    artoscopic knee     CARDIAC CATHETERIZATION  2003, 2005   40% blockage, two 30% blockages treated medically Ron Parker)   CARDIOVERSION N/A 02/12/2018   Procedure: CARDIOVERSION;  Surgeon: [name], MD;  Location: [facility];  Service: Cardiovascular;  Laterality: N/A;   CARDIOVERSION N/A 05/10/2021   Procedure: CARDIOVERSION;  Surgeon: [name], MD;  Location: [facility];  Service: Cardiovascular;  Laterality: N/A;   COLONOSCOPY  03/2015   TA x3, HP, melanosis coli, severe diverticulosis, rpt 3 yrs (Pyrtle)   COLONOSCOPY  01/2019   fair prep, diverticulosis, f/u PRN (Pyrtle)   CORONARY ARTERY BYPASS GRAFT N/A 12/12/2017   Procedure: CORONARY ARTERY BYPASS GRAFTING (CABG) x3 using the right greater saphenous vein harvested endoscopically and the left internal mammary artery. LIMA to LAD, SEQ SVG to OM1 & OM2;  Surgeon: [name], MD;  Location: [place];  Service: Open Heart Surgery;  Laterality: N/A;   DENTAL SURGERY     extractions   IR THORACENTESIS ASP PLEURAL SPACE W/IMG GUIDE  01/17/2018   KNEE ARTHROSCOPY Left    LEFT HEART CATH AND CORONARY ANGIOGRAPHY N/A 12/10/2017   Procedure: LEFT HEART CATH AND CORONARY ANGIOGRAPHY;  Surgeon: [name], MD;  Location: [place] [facility];  Service: Cardiovascular;  Laterality: N/A;   PEG PLACEMENT  02/2010   TEE WITHOUT CARDIOVERSION N/A 12/12/2017   Procedure: TRANSESOPHAGEAL ECHOCARDIOGRAM (TEE);  Surgeon: [name], MD;  Location: [place];  Service: Open Heart Surgery;  Laterality: N/A;   TONSILLECTOMY     triple bypass  11/2017   VASECTOMY      Social History   Socioeconomic History   Marital status: Married    Spouse name: Electrical engineer   Number of children: 1   Years of education: Not on file   Highest education level: Not on file  Occupational History   Not on file  Tobacco Use   Smoking status: Never   Smokeless tobacco: Never  Vaping Use   Vaping Use: Never used  Substance and Sexual Activity   Alcohol use:  Yes    Comment: glass of wine 1-2 times a month   Drug use: No   Sexual activity: Yes  Other Topics Concern   Not on file  Social History Narrative   Married 8 years- divorced; remarried 1994 Optometrist)   Forensic psychologist    Work; Film/video editor; 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    Plays golf, remains very active with an interest in politics. Jan 2011 moved to area.    Involved in [employer].    He is a runner - has run [employer] and [employer]. S/p torn meniscus.    Social Determinants of Health   Financial Resource Strain: Not on file  Food Insecurity: Not on file  Transportation Needs: Not on file  Physical Activity: Not on file  Stress: Not on file  Social Connections: Not on file  Intimate Partner Violence: Not on file     Family History  Problem Relation Age of Onset   Coronary artery disease Father    Heart attack Father 75   Heart disease Father    Stroke Mother    Colon cancer Neg Hx    Esophageal cancer Neg Hx    Rectal cancer Neg Hx    Stomach cancer Neg Hx    Kidney cancer Neg Hx    Kidney failure Neg Hx    Prostate cancer Neg Hx    Tuberculosis Neg Hx      Current Outpatient Medications:    amLODipine (NORVASC) 10 MG tablet, Take 1 tablet (10 mg total) by mouth daily., Disp: 90 tablet, Rfl: 1   apixaban (ELIQUIS) 5 MG TABS tablet, Take 1 tablet (5 mg total) by mouth 2 (two) times daily., Disp: 60 tablet, Rfl: 3   Cholecalciferol (VITAMIN D3) 25 MCG (1000 UT) CAPS, Take 1 capsule by mouth daily., Disp: , Rfl:    docusate sodium (COLACE) 250 MG capsule, Takes total 600mg  /day, Disp: , Rfl:    fluocinonide gel (LIDEX) 0.05 %, SMARTSIG:Sparingly Topical 3 Times Daily, Disp: , Rfl:    furosemide (LASIX) 20 MG tablet, Take 1 tablet (20 mg total) by mouth daily., Disp: 30 tablet, Rfl: 6   HURRICAINE 20 % AERO, as needed., Disp: , Rfl:    levothyroxine (EUTHYROX) 50 MCG tablet, Take 1 tablet  (50 mcg total) by mouth daily before breakfast., Disp: 30 tablet, Rfl: 8   MAGNESIUM PO, Take 1 tablet by mouth daily., Disp: , Rfl:    Multiple Vitamins-Minerals (ZINC PO), Take by mouth daily., Disp: , Rfl:    nitroGLYCERIN (NITROSTAT) 0.4 MG SL tablet, Place 1 tablet (0.4 mg total) under the tongue every  5 (five) minutes as needed for chest pain (Maximum 3 doses.)., Disp: 25 tablet, Rfl: 2   Omega-3 Fatty Acids (OMEGA 3 PO), Take by mouth daily., Disp: , Rfl:    ondansetron (ZOFRAN-ODT) 4 MG disintegrating tablet, Take 1 tablet (4 mg total) by mouth every 8 (eight) hours as needed for nausea or vomiting., Disp: 20 tablet, Rfl: 0   polyethylene glycol (MIRALAX / GLYCOLAX) 17 g packet, Take 17 g by mouth daily as needed., Disp: , Rfl:    Potassium 99 MG TABS, Take 1 tablet by mouth daily., Disp: , Rfl:    rosuvastatin (CRESTOR) 20 MG tablet, Take 1 tablet by mouth once daily, Disp: 90 tablet, Rfl: 0   sertraline (ZOLOFT) 25 MG tablet, Take 1 tablet by mouth once daily, Disp: 90 tablet, Rfl: 0   VITAMIN A PO, Take 2,400 mcg by mouth daily. , Disp: , Rfl:    vitamin E 400 UNIT capsule, Take 400 Units by mouth daily., Disp: , Rfl:    zaleplon (SONATA) 10 MG capsule, Take 1 capsule (10 mg total) by mouth at bedtime as needed for sleep., Disp: 30 capsule, Rfl: 0   Zinc 50 MG CAPS, Take 50 mg by mouth daily., Disp: , Rfl:    traMADol (ULTRAM) 50 MG tablet, TAKE 1 TABLET BY MOUTH EVERY 6 HOURS AS NEEDED, Disp: 30 tablet, Rfl: 0   Physical exam:  Vitals:   10/10/21 1400  BP: 139/81  Pulse: (!) 50  Resp: 18  Temp: (!) 96.8 F (36 C)  TempSrc: Tympanic  SpO2: 95%   Physical Exam HENT:     Mouth/Throat:     Comments: Oval ulceration noted in the right aspect of the cheek over the buccal mucosa anterior Cardiovascular:     Rate and Rhythm: Normal rate and regular rhythm.     Heart sounds: Normal heart sounds.  Pulmonary:     Effort: Pulmonary effort is normal.     Breath sounds: Normal  breath sounds.  Abdominal:     General: Bowel sounds are normal.     Palpations: Abdomen is soft.  Lymphadenopathy:     Comments: No palpable cervical adenopathy  Skin:    General: Skin is warm and dry.  Neurological:     Mental Status: He is alert and oriented to person, place, and time.       CMP Latest Ref Rng & Units 05/13/2021  Glucose 65 - 99 mg/dL 117(H)  BUN 8 - 27 mg/dL 15  Creatinine 0.76 - 1.27 mg/dL 0.77  Sodium 134 - 144 mmol/L 142  Potassium 3.5 - 5.2 mmol/L 4.4  Chloride 96 - 106 mmol/L 103  CO2 20 - 29 mmol/L 24  Calcium 8.6 - 10.2 mg/dL 9.5  Total Protein 6.0 - 8.5 g/dL -  Total Bilirubin 0.0 - 1.2 mg/dL -  Alkaline Phos 44 - 121 IU/L -  AST 0 - 40 IU/L -  ALT 0 - 44 IU/L -   CBC Latest Ref Rng & Units 04/13/2021  WBC 3.4 - 10.8 x10E3/uL 6.0  Hemoglobin 13.0 - 17.7 g/dL 16.9  Hematocrit 37.5 - 51.0 % 49.9  Platelets 150 - 450 x10E3/uL 131(L)    Assessment and plan- Patient is a 81 y.o. 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 If there is no evidence of locoregional adenopathy based on the stage of tumor definitive radiation therapy  should suffice in the treatment of this malignancy.  Unclear if patient will need systemic chemotherapy along with radiation.  Treatment will be given with a curative intent  Neoplasm related pain: I have given him a prescription for as needed tramadol  Thank you for this kind referral and the opportunity to participate in the care of this patient   Visit Diagnosis 1. Squamous cell cancer of tongue ([place])   2. Neoplasm related pain   3. Goals of care, counseling/discussion     Dr. Randa Evens, MD, MPH [facility] [phone] 10/16/2021

## 2021-10-17 ENCOUNTER — Encounter: Payer: Self-pay | Admitting: Radiation Oncology

## 2021-10-17 ENCOUNTER — Other Ambulatory Visit: Payer: Self-pay

## 2021-10-17 ENCOUNTER — Ambulatory Visit
Admission: RE | Admit: 2021-10-17 | Discharge: 2021-10-17 | Disposition: A | Discharge status: Home / Self Care | Payer: PPO | Source: Ambulatory Visit | Attending: Radiation Oncology | Admitting: Radiation Oncology

## 2021-10-17 VITALS — BP 159/48 | HR 56 | Resp 16 | Wt 188.8 lb

## 2021-10-17 DIAGNOSIS — Z923 Personal history of irradiation: Secondary | ICD-10-CM | POA: Diagnosis not present

## 2021-10-17 DIAGNOSIS — Z7901 Long term (current) use of anticoagulants: Secondary | ICD-10-CM | POA: Insufficient documentation

## 2021-10-17 DIAGNOSIS — E039 Hypothyroidism, unspecified: Secondary | ICD-10-CM | POA: Diagnosis not present

## 2021-10-17 DIAGNOSIS — Z86010 Personal history of colonic polyps: Secondary | ICD-10-CM | POA: Insufficient documentation

## 2021-10-17 DIAGNOSIS — R011 Cardiac murmur, unspecified: Secondary | ICD-10-CM | POA: Diagnosis not present

## 2021-10-17 DIAGNOSIS — R633 Feeding difficulties, unspecified: Secondary | ICD-10-CM | POA: Diagnosis not present

## 2021-10-17 DIAGNOSIS — I2510 Atherosclerotic heart disease of native coronary artery without angina pectoris: Secondary | ICD-10-CM | POA: Diagnosis not present

## 2021-10-17 DIAGNOSIS — Z79899 Other long term (current) drug therapy: Secondary | ICD-10-CM | POA: Insufficient documentation

## 2021-10-17 DIAGNOSIS — E785 Hyperlipidemia, unspecified: Secondary | ICD-10-CM | POA: Diagnosis not present

## 2021-10-17 DIAGNOSIS — Z85810 Personal history of malignant neoplasm of tongue: Secondary | ICD-10-CM | POA: Insufficient documentation

## 2021-10-17 DIAGNOSIS — I10 Essential (primary) hypertension: Secondary | ICD-10-CM | POA: Diagnosis not present

## 2021-10-17 DIAGNOSIS — D696 Thrombocytopenia, unspecified: Secondary | ICD-10-CM | POA: Diagnosis not present

## 2021-10-17 DIAGNOSIS — C060 Malignant neoplasm of cheek mucosa: Secondary | ICD-10-CM | POA: Diagnosis not present

## 2021-10-17 DIAGNOSIS — I4891 Unspecified atrial fibrillation: Secondary | ICD-10-CM | POA: Diagnosis not present

## 2021-10-17 DIAGNOSIS — I429 Cardiomyopathy, unspecified: Secondary | ICD-10-CM | POA: Diagnosis not present

## 2021-10-17 DIAGNOSIS — Z9221 Personal history of antineoplastic chemotherapy: Secondary | ICD-10-CM | POA: Insufficient documentation

## 2021-10-17 DIAGNOSIS — C029 Malignant neoplasm of tongue, unspecified: Secondary | ICD-10-CM

## 2021-10-17 NOTE — Consult Note (Signed)
NEW PATIENT EVALUATION  Name: Phillip [name]  MRN: [id number]  Date:   10/17/2021     DOB: 1940-10-05   This 81 y.o. male patient presents to the clinic for initial evaluation of squamous cell carcinoma of the right lower buccal mucosa not completely staged in patient now out 12 years having completed concurrent chemoradiation therapy for T1N1 squamous cell carcinoma of the right tonsil.  REFERRING PHYSICIAN: [name], MD  CHIEF COMPLAINT:  Chief Complaint  Patient presents with   Cancer    Initial consultation Tongue cancer.    DIAGNOSIS: The encounter diagnosis was Squamous cell carcinoma of tongue ([place]).   PREVIOUS INVESTIGATIONS:  Pathology reports reviewed PET scan ordered Clinical notes reviewed  HPI: Patient is an 81 year old male now at 12 years I completed concurrent chemoradiation therapy at [facility] long for a T1N1 squamous cell carcinoma of the right tonsil.  He has been followed for a lesion in the right cheek buccal mucosa which is ulcerated extremely painful.  Recently had a biopsy which was positive for invasive squamous cell carcinoma well differentiated keratinizing.  Patient has been seen by medical oncology and a PET CT scan has been ordered.  He is lost about 6 pounds of weight from difficulty eating secondary to this lesion.  He is currently on tramadol for pain.  PLANNED TREATMENT REGIMEN: Radiation therapy with possible chemotherapy depending on PET scan results  PAST MEDICAL HISTORY:  has a past medical history of Anxiety, Basal cell carcinoma (09/12/2018), Cancer ([place]), Coronary artery disease (2003), COVID-19 (03/2020), Diverticulosis, Hearing loss, Heart disease, Heart murmur, History of benign prostatic hypertrophy, History of radiation therapy (01/31/10-03/22/10), HPV in male, HTN (hypertension), Hyperlipidemia, Hypothyroid (01/16/2013), Ischemic cardiomyopathy, Neck pain, Persistent atrial fibrillation ([place]), Primary squamous cell carcinoma of skin of  lip (2023), Squamous cell carcinoma, Squamous cell carcinoma of skin (09/02/2019), Squamous cell carcinoma of skin (09/02/2019), Thrombocytopenia ([place]) (02/02/2012), Tinnitus, and Tubular adenoma of colon.    PAST SURGICAL HISTORY:  Past Surgical History:  Procedure Laterality Date   artoscopic knee     CARDIAC CATHETERIZATION  2003, 2005   40% blockage, two 30% blockages treated medically Ron Parker)   CARDIOVERSION N/A 02/12/2018   Procedure: CARDIOVERSION;  Surgeon: [name], MD;  Location: [facility];  Service: Cardiovascular;  Laterality: N/A;   CARDIOVERSION N/A 05/10/2021   Procedure: CARDIOVERSION;  Surgeon: [name], MD;  Location: [facility];  Service: Cardiovascular;  Laterality: N/A;   COLONOSCOPY  03/2015   TA x3, HP, melanosis coli, severe diverticulosis, rpt 3 yrs (Pyrtle)   COLONOSCOPY  01/2019   fair prep, diverticulosis, f/u PRN (Pyrtle)   CORONARY ARTERY BYPASS GRAFT N/A 12/12/2017   Procedure: CORONARY ARTERY BYPASS GRAFTING (CABG) x3 using the right greater saphenous vein harvested endoscopically and the left internal mammary artery. LIMA to LAD, SEQ SVG to OM1 & OM2;  Surgeon: [name], MD;  Location: [place];  Service: Open Heart Surgery;  Laterality: N/A;   DENTAL SURGERY     extractions   IR THORACENTESIS ASP PLEURAL SPACE W/IMG GUIDE  01/17/2018   KNEE ARTHROSCOPY Left    LEFT HEART CATH AND CORONARY ANGIOGRAPHY N/A 12/10/2017   Procedure: LEFT HEART CATH AND CORONARY ANGIOGRAPHY;  Surgeon: [name], MD;  Location: [place] [facility];  Service: Cardiovascular;  Laterality: N/A;   PEG PLACEMENT  02/2010   TEE WITHOUT CARDIOVERSION N/A 12/12/2017   Procedure: TRANSESOPHAGEAL ECHOCARDIOGRAM (TEE);  Surgeon: [name], MD;  Location: [place];  Service: Open  Heart Surgery;  Laterality: N/A;   TONSILLECTOMY     triple bypass  11/2017   VASECTOMY      FAMILY HISTORY: family history includes Coronary artery disease in his father; Heart attack  (age of onset: 28) in his father; Heart disease in his father; Stroke in his mother.  SOCIAL HISTORY:  reports that he has never smoked. He has never used smokeless tobacco. He reports current alcohol use. He reports that he does not use drugs.  ALLERGIES: Ace inhibitors and Morphine and related  MEDICATIONS:  Current Outpatient Medications  Medication Sig Dispense Refill   amLODipine (NORVASC) 10 MG tablet Take 1 tablet (10 mg total) by mouth daily. 90 tablet 1   apixaban (ELIQUIS) 5 MG TABS tablet Take 1 tablet (5 mg total) by mouth 2 (two) times daily. 60 tablet 3   Cholecalciferol (VITAMIN D3) 25 MCG (1000 UT) CAPS Take 1 capsule by mouth daily.     docusate sodium (COLACE) 250 MG capsule Takes total 600mg  /day     fluocinonide gel (LIDEX) 0.05 % SMARTSIG:Sparingly Topical 3 Times Daily     furosemide (LASIX) 20 MG tablet Take 1 tablet (20 mg total) by mouth daily. 30 tablet 6   HURRICAINE 20 % AERO as needed.     levothyroxine (EUTHYROX) 50 MCG tablet Take 1 tablet (50 mcg total) by mouth daily before breakfast. 30 tablet 8   MAGNESIUM PO Take 1 tablet by mouth daily.     Multiple Vitamins-Minerals (ZINC PO) Take by mouth daily.     nitroGLYCERIN (NITROSTAT) 0.4 MG SL tablet Place 1 tablet (0.4 mg total) under the tongue every 5 (five) minutes as needed for chest pain (Maximum 3 doses.). 25 tablet 2   Omega-3 Fatty Acids (OMEGA 3 PO) Take by mouth daily.     ondansetron (ZOFRAN-ODT) 4 MG disintegrating tablet Take 1 tablet (4 mg total) by mouth every 8 (eight) hours as needed for nausea or vomiting. 20 tablet 0   polyethylene glycol (MIRALAX / GLYCOLAX) 17 g packet Take 17 g by mouth daily as needed.     Potassium 99 MG TABS Take 1 tablet by mouth daily.     rosuvastatin (CRESTOR) 20 MG tablet Take 1 tablet by mouth once daily 90 tablet 0   sertraline (ZOLOFT) 25 MG tablet Take 1 tablet by mouth once daily 90 tablet 0   traMADol (ULTRAM) 50 MG tablet TAKE 1 TABLET BY MOUTH EVERY 6  HOURS AS NEEDED 30 tablet 0   VITAMIN A PO Take 2,400 mcg by mouth daily.      vitamin E 400 UNIT capsule Take 400 Units by mouth daily.     zaleplon (SONATA) 10 MG capsule Take 1 capsule (10 mg total) by mouth at bedtime as needed for sleep. 30 capsule 0   Zinc 50 MG CAPS Take 50 mg by mouth daily.     No current facility-administered medications for this encounter.    ECOG PERFORMANCE STATUS:  1 - Symptomatic but completely ambulatory  REVIEW OF SYSTEMS: Previous history of T1N1 squamous cell carcinoma of the right tonsil status post concurrent chemoradiation Patient denies any weight loss, fatigue, weakness, fever, chills or night sweats. Patient denies any loss of vision, blurred vision. Patient denies any ringing  of the ears or hearing loss. No irregular heartbeat. Patient denies heart murmur or history of fainting. Patient denies any chest pain or pain radiating to her upper extremities. Patient denies any shortness of breath, difficulty breathing at night,  cough or hemoptysis. Patient denies any swelling in the lower legs. Patient denies any nausea vomiting, vomiting of blood, or coffee ground material in the vomitus. Patient denies any stomach pain. Patient states has had normal bowel movements no significant constipation or diarrhea. Patient denies any dysuria, hematuria or significant nocturia. Patient denies any problems walking, swelling in the joints or loss of balance. Patient denies any skin changes, loss of hair or loss of weight. Patient denies any excessive worrying or anxiety or significant depression. Patient denies any problems with insomnia. Patient denies excessive thirst, polyuria, polydipsia. Patient denies any swollen glands, patient denies easy bruising or easy bleeding. Patient denies any recent infections, allergies or URI. Patient "s visual fields have not changed significantly in recent time.   PHYSICAL EXAM: BP (!) 159/48    Pulse (!) 56    Resp 16    Wt 188 lb 12.8 oz  (85.6 kg)    BMI 24.24 kg/m  Oral cavity shows an ulcerated lesion of the right buccal mucosal he is extremely sensitive to examination.  His neck shows fibrotic changes consistent with prior radiation therapy.  No evidence of adenopathy is identified.  Well-developed well-nourished patient in NAD. HEENT reveals PERLA, EOMI, discs not visualized.  Oral cavity is clear. No oral mucosal lesions are identified. Neck is clear without evidence of cervical or supraclavicular adenopathy. Lungs are clear to A&P. Cardiac examination is essentially unremarkable with regular rate and rhythm without murmur rub or thrill. Abdomen is benign with no organomegaly or masses noted. Motor sensory and DTR levels are equal and symmetric in the upper and lower extremities. Cranial nerves II through XII are grossly intact. Proprioception is intact. No peripheral adenopathy or edema is identified. No motor or sensory levels are noted. Crude visual fields are within normal range.  LABORATORY DATA: Pathology report reviewed    RADIOLOGY RESULTS: PET CT scan ordered   IMPRESSION: Possible early stage squamous cell carcinoma the right buccal mucosa in patient with history of chemoradiation therapy for tonsillar cancer 12 years prior in 81 year old male  PLAN: At this time I would like to review the PET CT scan for complete staging of this lesion should this be an early stage lesion of buccal mucosa with no evidence of metastatic disease to the neck I would treat with radiation therapy up to 60 Gray over 6 weeks.  I would use PET CT fusion study for treatment planning.  Risks and benefits of treatment including oral oral mucositis possible skin reaction fatigue possible slight increase in xerostomia all were discussed in detail with the patient and his wife.  They both seem to comprehend my treatment plan well.  I have set up for simulation 2 days after his PET scan.  I would like to take this opportunity to thank you for allowing  me to participate in the care of your patient.Noreene Filbert, MD

## 2021-10-20 ENCOUNTER — Other Ambulatory Visit: Payer: Self-pay | Admitting: Internal Medicine

## 2021-10-25 ENCOUNTER — Ambulatory Visit (HOSPITAL_COMMUNITY)
Admission: RE | Admit: 2021-10-25 | Discharge: 2021-10-25 | Disposition: A | Discharge status: Home / Self Care | Payer: PPO | Source: Ambulatory Visit | Attending: Oncology | Admitting: Oncology

## 2021-10-25 ENCOUNTER — Other Ambulatory Visit: Payer: Self-pay

## 2021-10-25 DIAGNOSIS — C029 Malignant neoplasm of tongue, unspecified: Secondary | ICD-10-CM | POA: Insufficient documentation

## 2021-10-25 DIAGNOSIS — Z951 Presence of aortocoronary bypass graft: Secondary | ICD-10-CM | POA: Diagnosis not present

## 2021-10-25 DIAGNOSIS — I700 Atherosclerosis of aorta: Secondary | ICD-10-CM | POA: Diagnosis not present

## 2021-10-25 DIAGNOSIS — C760 Malignant neoplasm of head, face and neck: Secondary | ICD-10-CM | POA: Diagnosis not present

## 2021-10-25 DIAGNOSIS — J90 Pleural effusion, not elsewhere classified: Secondary | ICD-10-CM | POA: Diagnosis not present

## 2021-10-25 LAB — GLUCOSE, CAPILLARY: Glucose-Capillary: 132 mg/dL — ABNORMAL HIGH (ref 70–99)

## 2021-10-25 MED ORDER — FLUDEOXYGLUCOSE F - 18 (FDG) INJECTION
9.4000 | Freq: Once | INTRAVENOUS | Status: AC
  Administered 2021-10-25: 9.25 via INTRAVENOUS

## 2021-10-27 ENCOUNTER — Ambulatory Visit
Admission: RE | Admit: 2021-10-27 | Discharge: 2021-10-27 | Disposition: A | Discharge status: Home / Self Care | Payer: PPO | Source: Ambulatory Visit | Attending: Radiation Oncology | Admitting: Radiation Oncology

## 2021-10-27 DIAGNOSIS — Z510 Encounter for antineoplastic radiation therapy: Secondary | ICD-10-CM | POA: Insufficient documentation

## 2021-10-27 DIAGNOSIS — C060 Malignant neoplasm of cheek mucosa: Secondary | ICD-10-CM | POA: Insufficient documentation

## 2021-11-02 ENCOUNTER — Ambulatory Visit (INDEPENDENT_AMBULATORY_CARE_PROVIDER_SITE_OTHER): Payer: PPO | Admitting: Family Medicine

## 2021-11-02 ENCOUNTER — Encounter: Payer: Self-pay | Admitting: Family Medicine

## 2021-11-02 ENCOUNTER — Other Ambulatory Visit: Payer: Self-pay

## 2021-11-02 VITALS — BP 140/82 | HR 58 | Temp 97.6°F | Ht 74.0 in | Wt 189.1 lb

## 2021-11-02 DIAGNOSIS — I4891 Unspecified atrial fibrillation: Secondary | ICD-10-CM

## 2021-11-02 DIAGNOSIS — R011 Cardiac murmur, unspecified: Secondary | ICD-10-CM | POA: Diagnosis not present

## 2021-11-02 DIAGNOSIS — C060 Malignant neoplasm of cheek mucosa: Secondary | ICD-10-CM | POA: Diagnosis not present

## 2021-11-02 DIAGNOSIS — I5032 Chronic diastolic (congestive) heart failure: Secondary | ICD-10-CM | POA: Diagnosis not present

## 2021-11-02 DIAGNOSIS — Z510 Encounter for antineoplastic radiation therapy: Secondary | ICD-10-CM | POA: Diagnosis not present

## 2021-11-02 DIAGNOSIS — C069 Malignant neoplasm of mouth, unspecified: Secondary | ICD-10-CM | POA: Insufficient documentation

## 2021-11-02 NOTE — Assessment & Plan Note (Deleted)
Stable period followed by cardiology.  ?Continue lasix.  ?

## 2021-11-02 NOTE — Assessment & Plan Note (Signed)
Continue eliquis.  ?Now off Toprol XL.  ?

## 2021-11-02 NOTE — Progress Notes (Signed)
? ? Patient ID: Phillip [name], male    DOB: 26-Feb-1941, 81 y.o.   MRN: [id number] ? ?This visit was conducted in person. ? ?BP 140/82   Pulse (!) 58   Temp 97.6 ?F (36.4 ?C) (Temporal)   Ht '6\' 2"'$  (1.88 m)   Wt 189 lb 2 oz (85.8 kg)   SpO2 97%   BMI 24.28 kg/m?   ? ?CC: 6 mo f/u visit  ?Subjective:  ? ?HPI: ?Phillip [name] is a 81 y.o. male presenting on 11/02/2021 for Follow-up (Here for 6 mo f/u.  Pt accompanied by wife, Fraser Din. ) ? ? ?New onset afib 03/2021 on eliquis and Toprol XL '25mg'$  daily. S/p successful cardioversion 04/2021 with subsequent discontinuation of Toprol XL. (End).  ? ?Chronic HFpEF with iCM - overall asymptomatic, managed with furosemide (echo showed elevated central venous and pulm artery pressures).  ? ?H/o tonsillar cancer s/p XRT 2011.  ? ?Referred to Dr Aundra Dubin [facility] ENT) for persistent R cheek mucosal irritation s/p biopsy positive for squamous cell carcinoma, not resectable, referred to oncology to consider radiation or chemo. PET scan 10/2021 showed no evidence of HEENT LN mets, no primary identified, no evience of esophageal carcinoma or evidence of metastatic disease. Rx tramadol - he hasn't tried this yet. Instead is using tylenol/ibuprofen. Seeing Dr Baruch Gouty, planned XRT 5x/wk for 6 wks, to start next week.  ? ?He attributes area to where he's chronically been biting cheek in his sleep. Has tried mouth guards without benefit.  ? ?Appetite is ok. Overall in good spirits.  ?   ? ?Relevant past medical, surgical, family and social history reviewed and updated as indicated. Interim medical history since our last visit reviewed. ?Allergies and medications reviewed and updated. ?Outpatient Medications Prior to Visit  ?Medication Sig Dispense Refill  ? amLODipine (NORVASC) 10 MG tablet Take 1 tablet (10 mg total) by mouth daily. 90 tablet 1  ? apixaban (ELIQUIS) 5 MG TABS tablet Take 1 tablet (5 mg total) by mouth 2 (two) times daily. 60 tablet 3  ? Cholecalciferol (VITAMIN D3) 25 MCG (1000  UT) CAPS Take 1 capsule by mouth daily.    ? docusate sodium (COLACE) 250 MG capsule Takes total '600mg'$  /day    ? fluocinonide gel (LIDEX) 0.05 % SMARTSIG:Sparingly Topical 3 Times Daily    ? furosemide (LASIX) 20 MG tablet Take 1 tablet (20 mg total) by mouth daily. 30 tablet 6  ? HURRICAINE 20 % AERO as needed.    ? levothyroxine (EUTHYROX) 50 MCG tablet Take 1 tablet (50 mcg total) by mouth daily before breakfast. 30 tablet 8  ? MAGNESIUM PO Take 1 tablet by mouth daily.    ? Multiple Vitamins-Minerals (ZINC PO) Take by mouth daily.    ? nitroGLYCERIN (NITROSTAT) 0.4 MG SL tablet Place 1 tablet (0.4 mg total) under the tongue every 5 (five) minutes as needed for chest pain (Maximum 3 doses.). 25 tablet 2  ? Omega-3 Fatty Acids (OMEGA 3 PO) Take by mouth daily.    ? ondansetron (ZOFRAN-ODT) 4 MG disintegrating tablet Take 1 tablet (4 mg total) by mouth every 8 (eight) hours as needed for nausea or vomiting. 20 tablet 0  ? polyethylene glycol (MIRALAX / GLYCOLAX) 17 g packet Take 17 g by mouth daily as needed.    ? Potassium 99 MG TABS Take 1 tablet by mouth daily.    ? rosuvastatin (CRESTOR) 20 MG tablet Take 1 tablet by mouth once daily 90 tablet 0  ?  sertraline (ZOLOFT) 25 MG tablet Take 1 tablet by mouth once daily 90 tablet 0  ? traMADol (ULTRAM) 50 MG tablet TAKE 1 TABLET BY MOUTH EVERY 6 HOURS AS NEEDED 30 tablet 0  ? VITAMIN A PO Take 2,400 mcg by mouth daily.     ? vitamin E 400 UNIT capsule Take 400 Units by mouth daily.    ? zaleplon (SONATA) 10 MG capsule Take 1 capsule (10 mg total) by mouth at bedtime as needed for sleep. 30 capsule 0  ? Zinc 50 MG CAPS Take 50 mg by mouth daily.    ? ?No facility-administered medications prior to visit.  ?  ? ?Per HPI unless specifically indicated in ROS section below ?Review of Systems ? ?Objective:  ?BP 140/82   Pulse (!) 58   Temp 97.6 ?F (36.4 ?C) (Temporal)   Ht '6\' 2"'$  (1.88 m)   Wt 189 lb 2 oz (85.8 kg)   SpO2 97%   BMI 24.28 kg/m?   ?Wt Readings from Last  3 Encounters:  ?11/02/21 189 lb 2 oz (85.8 kg)  ?10/17/21 188 lb 12.8 oz (85.6 kg)  ?09/02/21 193 lb (87.5 kg)  ?  ?  ?Physical Exam ?Vitals and nursing note reviewed.  ?Constitutional:   ?   Appearance: Normal appearance. He is not ill-appearing.  ?HENT:  ?   Mouth/Throat:  ?   Mouth: Oral lesions present.  ?   Comments: Persistent ulcer to R inner cheek at site of prior biopsy ?Eyes:  ?   Extraocular Movements: Extraocular movements intact.  ?   Pupils: Pupils are equal, round, and reactive to light.  ?Neck:  ?   Comments: Woody consistency to neck after prior XRT ?Cardiovascular:  ?   Rate and Rhythm: Normal rate. Rhythm irregular. Occasional Extrasystoles are present. ?   Pulses: Normal pulses.  ?   Heart sounds: Murmur (3/6 systolic USB) heard.  ?Pulmonary:  ?   Effort: Pulmonary effort is normal. No respiratory distress.  ?   Breath sounds: Normal breath sounds. No wheezing, rhonchi or rales.  ?Musculoskeletal:  ?   Right lower leg: No edema.  ?   Left lower leg: No edema.  ?Skin: ?   General: Skin is warm and dry.  ?   Findings: No rash.  ?Neurological:  ?   Mental Status: He is alert.  ?Psychiatric:     ?   Mood and Affect: Mood normal.     ?   Behavior: Behavior normal.  ? ?   ?Results for orders placed or performed during the hospital encounter of 10/25/21  ?Glucose, capillary  ?Result Value Ref Range  ? Glucose-Capillary 132 (H) 70 - 99 mg/dL  ? ?Lab Results  ?Component Value Date  ? HGBA1C 6.1 04/26/2021  ?  ?Assessment & Plan:  ?This visit occurred during the SARS-CoV-2 public health emergency.  Safety protocols were in place, including screening questions prior to the visit, additional usage of staff PPE, and extensive cleaning of exam room while observing appropriate contact time as indicated for disinfecting solutions.  ? ?Problem List Items Addressed This Visit   ? ? Systolic murmur  ?  Heard today.  ?Known aortic sclerosis without stenosis on prior echo.  ?  ?  ? Chronic heart failure with  preserved ejection fraction (HFpEF) ([place])  ?  Stable period followed by cardiology.  ?Continue lasix.  ?  ?  ? Atrial fibrillation ([place])  ?  Continue eliquis.  ?Now off Toprol XL.  ?  ?  ?  Oral cancer ([place]) - Primary  ?  New squamous cell cancer to R cheek. Recent PET reassuring. Planned 6 wks XRT. Appreciate onc/rad onc and ENT care. He is in good spirits to complete this treatment.  ?  ?  ?  ? ?No orders of the defined types were placed in this encounter. ? ?No orders of the defined types were placed in this encounter. ? ? ? ?Patient Instructions  ?Good to see you today ?I hope you do well with upcoming radiation therapy! ?Return as needed or in 6 months for follow up visit.  ? ?Follow up plan: ?Return in about 6 months (around 05/05/2022) for annual exam, prior fasting for blood work, medicare wellness visit. ? ?[name], MD   ?

## 2021-11-02 NOTE — Patient Instructions (Signed)
Good to see you today ?I hope you do well with upcoming radiation therapy! ?Return as needed or in 6 months for follow up visit.  ?

## 2021-11-02 NOTE — Assessment & Plan Note (Signed)
Stable period followed by cardiology.  ?Continue lasix.  ?

## 2021-11-02 NOTE — Assessment & Plan Note (Signed)
Heard today.  ?Known aortic sclerosis without stenosis on prior echo.  ?

## 2021-11-02 NOTE — Assessment & Plan Note (Signed)
New squamous cell cancer to R cheek. Recent PET reassuring. Planned 6 wks XRT. Appreciate onc/rad onc and ENT care. He is in good spirits to complete this treatment.  ?

## 2021-11-04 ENCOUNTER — Other Ambulatory Visit: Payer: Self-pay | Admitting: *Deleted

## 2021-11-04 DIAGNOSIS — C029 Malignant neoplasm of tongue, unspecified: Secondary | ICD-10-CM

## 2021-11-07 ENCOUNTER — Ambulatory Visit: Admission: RE | Admit: 2021-11-07 | Payer: PPO | Source: Ambulatory Visit

## 2021-11-08 ENCOUNTER — Ambulatory Visit
Admission: RE | Admit: 2021-11-08 | Discharge: 2021-11-08 | Disposition: A | Discharge status: Home / Self Care | Payer: PPO | Source: Ambulatory Visit | Attending: Radiation Oncology | Admitting: Radiation Oncology

## 2021-11-08 DIAGNOSIS — Z510 Encounter for antineoplastic radiation therapy: Secondary | ICD-10-CM | POA: Diagnosis not present

## 2021-11-08 DIAGNOSIS — C060 Malignant neoplasm of cheek mucosa: Secondary | ICD-10-CM | POA: Diagnosis not present

## 2021-11-09 ENCOUNTER — Inpatient Hospital Stay: Payer: PPO | Admitting: Oncology

## 2021-11-09 ENCOUNTER — Ambulatory Visit
Admission: RE | Admit: 2021-11-09 | Discharge: 2021-11-09 | Disposition: A | Discharge status: Home / Self Care | Payer: PPO | Source: Ambulatory Visit | Attending: Radiation Oncology | Admitting: Radiation Oncology

## 2021-11-09 ENCOUNTER — Other Ambulatory Visit: Payer: Self-pay

## 2021-11-09 ENCOUNTER — Encounter: Payer: Self-pay | Admitting: Oncology

## 2021-11-09 VITALS — BP 170/71 | HR 65 | Temp 98.3°F | Resp 16 | Ht 74.0 in | Wt 189.9 lb

## 2021-11-09 DIAGNOSIS — C060 Malignant neoplasm of cheek mucosa: Secondary | ICD-10-CM | POA: Insufficient documentation

## 2021-11-09 DIAGNOSIS — Z510 Encounter for antineoplastic radiation therapy: Secondary | ICD-10-CM | POA: Diagnosis not present

## 2021-11-09 NOTE — Progress Notes (Signed)
Pt use to drink boost/ ensure and had a g tube several years ago.he says since he had the mouth cancer. He doses not have swallowing problems but he eats soft foods and sometimes chicken if it is dark meat that is mor juicier. He drinks fluids. He still has 2 boxes of boost / ensure but he is holding it for now. He states since the cancer this time he has lost 20 lbs. ?

## 2021-11-09 NOTE — Progress Notes (Signed)
? ? ? ?Hematology/Oncology Consult note ?[place]  ?[phone] [phone] ? ?Patient Care Team: ?[name], MD as PCP - General (Family Medicine) ?[name], MD as PCP - Cardiology (Cardiology) ?Heath Lark, MD as Consulting Physician (Hematology and Oncology) ?Arlys John Lauriers, OD as Set designer (Optometry) ?Sindy Guadeloupe, MD as Consulting Physician (Oncology) ?Noreene Filbert, MD as Consulting Physician (Radiation Oncology)  ? ?Name of the patient: Phillip [name]  ?[id number]  ?1941-05-19  ? ?Date of visit: 11/09/21 ? 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 ?Chief complaint/ Reason for visit-discuss PET CT scan results and further management ? ?Heme/Onc history: Patient is a 81 year old male with a past medical history significant for tonsillar cancer in the past treated with radiation treatment and cisplatin-based chemotherapy in 2011.  He presented to ENT with symptoms of her right cheek mucosal irritation which did not get better after topical remedies.  This was ultimately biopsied by Dr. Aundra Dubin and came back positive for squamous cell carcinoma while differentiated and keratinizing.  This has not been deemed to be resectable by ENT and therefore referred for consideration of radiation or chemotherapy.  Patient currently reports intense pain in the region of the ulcer.  He is using Tylenol which does not help him with the pain.  He has been eating with difficulty and has lost about 6 pounds in the last 4 months. ? ?PET CT scan did not show anyEvidence of locoregional or distant metastatic disease. ? ?Interval history-patient has not used his as needed tramadol yet.  He has met with radiation oncology and has started radiation treatment already. ? ?ECOG PS- 1 ?Pain scale- 2 ?Opioid associated constipation- no ? ?Review of systems- Review of Systems  ?Constitutional:  Negative for chills, fever,  malaise/fatigue and weight loss.  ?HENT:  Negative for congestion, ear discharge and nosebleeds.   ?Eyes:  Negative for blurred vision.  ?Respiratory:  Negative for cough, hemoptysis, sputum production, shortness of breath and wheezing.   ?Cardiovascular:  Negative for chest pain, palpitations, orthopnea and claudication.  ?Gastrointestinal:  Negative for abdominal pain, blood in stool, constipation, diarrhea, heartburn, melena, nausea and vomiting.  ?Genitourinary:  Negative for dysuria, flank pain, frequency, hematuria and urgency.  ?Musculoskeletal:  Negative for back pain, joint pain and myalgias.  ?Skin:  Negative for rash.  ?Neurological:  Negative for dizziness, tingling, focal weakness, seizures, weakness and headaches.  ?Endo/Heme/Allergies:  Does not bruise/bleed easily.  ?Psychiatric/Behavioral:  Negative for depression and suicidal ideas. The patient does not have insomnia.    ? ? ? ?Allergies  ?Allergen Reactions  ? Ace Inhibitors Swelling and Other (See Comments)  ?  Possible angioedema (upper lip swelling)  ? Morphine And Related Swelling  ?  SWELLING REACTION UNSPECIFIED  ?[severity rated per PMH, 12/11/2017]  ? ? ? ?Past Medical History:  ?Diagnosis Date  ? Anxiety   ? Basal cell carcinoma 09/12/2018  ? Left forehead above lateral brow. Nodular pattern  ? Cancer [facility])   ? Coronary artery disease 2003  ? a. nonobstructive CAD by cath in 2003 and 2005; b.  Status post three-vessel CABG on 12/12/2017 with LIMA to LAD, sequential reverse SVG to OM1 and distal LCx  ? COVID-19 03/2020  ? Diverticulosis   ? Hearing loss   ? bilateral  ? Heart disease   ? Heart murmur   ? History of benign prostatic hypertrophy   ? History of radiation therapy 01/31/10-03/22/10  ? right tonsil/right neck node 7000  cGy 35 sessions, high risk lymph node volume 5940 cGy 35 sessions, low risk lymph node vol 5600 cGy 35 sessions  ? HPV in male   ? positive  ? HTN (hypertension)   ? Hyperlipidemia   ? diet controlled- taking  medication d/t ensure drinking  ? Hypothyroid 01/16/2013  ? Ischemic cardiomyopathy   ? a. 10/2017: echo showing reduced EF of 40-45%, HK of the anterior, anteroseptal and apical myocardium with Grade 1 DD.   ? Neck pain   ? Persistent atrial fibrillation ([place])   ? Primary squamous cell carcinoma of skin of lip 2023  ? lip and tonuge  ? Squamous cell carcinoma   ? right tonsil- 2011  ? Squamous cell carcinoma of skin 09/02/2019  ? Crown scalp. WD SCC  ? Squamous cell carcinoma of skin 09/02/2019  ? Left distal lateral deltoid. SCCis arising in SK  ? Thrombocytopenia ([place]) 02/02/2012  ? unclear etiology  ? Tinnitus   ? Tubular adenoma of colon   ? ? ? ?Past Surgical History:  ?Procedure Laterality Date  ? artoscopic knee    ? CARDIAC CATHETERIZATION  2003, 2005  ? 40% blockage, two 30% blockages treated medically [facility])  ? CARDIOVERSION N/A 02/12/2018  ? Procedure: CARDIOVERSION;  Surgeon: [name], MD;  Location: [facility] ORS;  Service: Cardiovascular;  Laterality: N/A;  ? CARDIOVERSION N/A 05/10/2021  ? Procedure: CARDIOVERSION;  Surgeon: [name], MD;  Location: [facility] OR[facility];  Service: Cardiovascular;  Laterality: N/A;  ? COLONOSCOPY  03/2015  ? TA x3, HP, melanosis coli, severe diverticulosis, rpt 3 yrs ([place])  ? COLONOSCOPY  01/2019  ? fair prep, diverticulosis, f/u PRN ([place])  ? CORONARY ARTERY BYPASS GRAFT N/A 12/12/2017  ? Procedure: CORONARY ARTERY BYPASS GRAFTING (CABG) x3 using the right greater saphenous vein harvested endoscopically and the left internal mammary artery. LIMA to LAD, SEQ SVG to OM1 & OM2;  Surgeon: [name], MD;  Location: [place];  Service: Open Heart Surgery;  Laterality: N/A;  ? DENTAL SURGERY    ? extractions  ? IR THORACENTESIS ASP PLEURAL SPACE W/IMG GUIDE  01/17/2018  ? KNEE ARTHROSCOPY Left   ? LEFT HEART CATH AND CORONARY ANGIOGRAPHY N/A 12/10/2017  ? Procedure: LEFT HEART CATH AND CORONARY ANGIOGRAPHY;  Surgeon: [name], MD;  Location: [place] [facility];   Service: Cardiovascular;  Laterality: N/A;  ? PEG PLACEMENT  02/2010  ? TEE WITHOUT CARDIOVERSION N/A 12/12/2017  ? Procedure: TRANSESOPHAGEAL ECHOCARDIOGRAM (TEE);  Surgeon: [name], MD;  Location: [place];  Service: Open Heart Surgery;  Laterality: N/A;  ? TONSILLECTOMY    ? triple bypass  11/2017  ? VASECTOMY    ? ? ?Social History  ? ?Socioeconomic History  ? Marital status: Married  ?  Spouse name: Fraser Din  ? Number of children: 1  ? Years of education: Not on file  ? Highest education level: Not on file  ?Occupational History  ? Not on file  ?Tobacco Use  ? Smoking status: Never  ? Smokeless tobacco: Never  ?Vaping Use  ? Vaping Use: Never used  ?Substance and Sexual Activity  ? Alcohol use: Yes  ?  Comment: glass of wine 1-2 times a month  ? Drug use: No  ? Sexual activity: Yes  ?Other Topics Concern  ? Not on file  ?Social History Narrative  ? 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 College Graduate   ? 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   ? Plays golf, remains very active with an interest in politics. Jan 2011 moved to area.   ? Involved in [employer].   ? He is a runner - has run [employer] and [employer]. S/p torn meniscus.   ? ?Social Determinants of Health  ? ?Financial Resource Strain: Not on file  ?Food Insecurity: Not on file  ?Transportation Needs: Not on file  ?Physical Activity: Not on file  ?Stress: Not on file  ?Social Connections: Not on file  ?Intimate Partner Violence: Not on file  ? ? ?Family History  ?Problem Relation Age of Onset  ? Coronary artery disease Father   ? Heart attack Father 71  ? Heart disease Father   ? Stroke Mother   ? Colon cancer Neg Hx   ? Esophageal cancer Neg Hx   ? Rectal cancer Neg Hx   ? Stomach cancer Neg Hx   ? Kidney cancer Neg Hx   ? Kidney failure Neg Hx   ? Prostate cancer Neg Hx   ? Tuberculosis Neg Hx   ? ? ? ?Current Outpatient Medications:  ?   amLODipine (NORVASC) 10 MG tablet, Take 1 tablet (10 mg total) by mouth daily., Disp: 90 tablet, Rfl: 1 ?  apixaban (ELIQUIS) 5 MG TABS tablet, Take 1 tablet (5 mg total) by mouth 2 (two) times daily., Disp: 60 tablet, R

## 2021-11-10 ENCOUNTER — Ambulatory Visit
Admission: RE | Admit: 2021-11-10 | Discharge: 2021-11-10 | Disposition: A | Discharge status: Home / Self Care | Payer: PPO | Source: Ambulatory Visit | Attending: Radiation Oncology | Admitting: Radiation Oncology

## 2021-11-10 DIAGNOSIS — Z510 Encounter for antineoplastic radiation therapy: Secondary | ICD-10-CM | POA: Diagnosis not present

## 2021-11-10 DIAGNOSIS — C060 Malignant neoplasm of cheek mucosa: Secondary | ICD-10-CM | POA: Diagnosis not present

## 2021-11-11 ENCOUNTER — Ambulatory Visit
Admission: RE | Admit: 2021-11-11 | Discharge: 2021-11-11 | Disposition: A | Discharge status: Home / Self Care | Payer: PPO | Source: Ambulatory Visit | Attending: Radiation Oncology | Admitting: Radiation Oncology

## 2021-11-11 DIAGNOSIS — C060 Malignant neoplasm of cheek mucosa: Secondary | ICD-10-CM | POA: Diagnosis not present

## 2021-11-11 DIAGNOSIS — Z510 Encounter for antineoplastic radiation therapy: Secondary | ICD-10-CM | POA: Diagnosis not present

## 2021-11-14 ENCOUNTER — Other Ambulatory Visit: Payer: Self-pay | Admitting: Internal Medicine

## 2021-11-14 ENCOUNTER — Ambulatory Visit
Admission: RE | Admit: 2021-11-14 | Discharge: 2021-11-14 | Disposition: A | Discharge status: Home / Self Care | Payer: PPO | Source: Ambulatory Visit | Attending: Radiation Oncology | Admitting: Radiation Oncology

## 2021-11-14 DIAGNOSIS — Z510 Encounter for antineoplastic radiation therapy: Secondary | ICD-10-CM | POA: Diagnosis not present

## 2021-11-14 DIAGNOSIS — C060 Malignant neoplasm of cheek mucosa: Secondary | ICD-10-CM | POA: Diagnosis not present

## 2021-11-15 ENCOUNTER — Ambulatory Visit
Admission: RE | Admit: 2021-11-15 | Discharge: 2021-11-15 | Disposition: A | Discharge status: Home / Self Care | Payer: PPO | Source: Ambulatory Visit | Attending: Radiation Oncology | Admitting: Radiation Oncology

## 2021-11-15 DIAGNOSIS — C060 Malignant neoplasm of cheek mucosa: Secondary | ICD-10-CM | POA: Diagnosis not present

## 2021-11-15 DIAGNOSIS — Z510 Encounter for antineoplastic radiation therapy: Secondary | ICD-10-CM | POA: Diagnosis not present

## 2021-11-16 ENCOUNTER — Inpatient Hospital Stay: Payer: PPO

## 2021-11-16 ENCOUNTER — Ambulatory Visit
Admission: RE | Admit: 2021-11-16 | Discharge: 2021-11-16 | Disposition: A | Discharge status: Home / Self Care | Payer: PPO | Source: Ambulatory Visit | Attending: Radiation Oncology | Admitting: Radiation Oncology

## 2021-11-16 DIAGNOSIS — Z510 Encounter for antineoplastic radiation therapy: Secondary | ICD-10-CM | POA: Diagnosis not present

## 2021-11-16 DIAGNOSIS — C060 Malignant neoplasm of cheek mucosa: Secondary | ICD-10-CM | POA: Diagnosis not present

## 2021-11-17 ENCOUNTER — Ambulatory Visit
Admission: RE | Admit: 2021-11-17 | Discharge: 2021-11-17 | Disposition: A | Discharge status: Home / Self Care | Payer: PPO | Source: Ambulatory Visit | Attending: Radiation Oncology | Admitting: Radiation Oncology

## 2021-11-17 DIAGNOSIS — Z85818 Personal history of malignant neoplasm of other sites of lip, oral cavity, and pharynx: Secondary | ICD-10-CM | POA: Diagnosis not present

## 2021-11-17 DIAGNOSIS — Z510 Encounter for antineoplastic radiation therapy: Secondary | ICD-10-CM | POA: Diagnosis not present

## 2021-11-17 DIAGNOSIS — C060 Malignant neoplasm of cheek mucosa: Secondary | ICD-10-CM | POA: Diagnosis not present

## 2021-11-18 ENCOUNTER — Ambulatory Visit
Admission: RE | Admit: 2021-11-18 | Discharge: 2021-11-18 | Disposition: A | Discharge status: Home / Self Care | Payer: PPO | Source: Ambulatory Visit | Attending: Radiation Oncology | Admitting: Radiation Oncology

## 2021-11-18 DIAGNOSIS — Z510 Encounter for antineoplastic radiation therapy: Secondary | ICD-10-CM | POA: Diagnosis not present

## 2021-11-18 DIAGNOSIS — C060 Malignant neoplasm of cheek mucosa: Secondary | ICD-10-CM | POA: Diagnosis not present

## 2021-11-19 ENCOUNTER — Other Ambulatory Visit: Payer: Self-pay | Admitting: Family Medicine

## 2021-11-21 ENCOUNTER — Other Ambulatory Visit: Payer: Self-pay | Admitting: Internal Medicine

## 2021-11-21 ENCOUNTER — Ambulatory Visit
Admission: RE | Admit: 2021-11-21 | Discharge: 2021-11-21 | Disposition: A | Discharge status: Home / Self Care | Payer: PPO | Source: Ambulatory Visit | Attending: Radiation Oncology | Admitting: Radiation Oncology

## 2021-11-21 DIAGNOSIS — Z510 Encounter for antineoplastic radiation therapy: Secondary | ICD-10-CM | POA: Diagnosis not present

## 2021-11-21 DIAGNOSIS — C060 Malignant neoplasm of cheek mucosa: Secondary | ICD-10-CM | POA: Insufficient documentation

## 2021-11-22 ENCOUNTER — Ambulatory Visit
Admission: RE | Admit: 2021-11-22 | Discharge: 2021-11-22 | Disposition: A | Discharge status: Home / Self Care | Payer: PPO | Source: Ambulatory Visit | Attending: Radiation Oncology | Admitting: Radiation Oncology

## 2021-11-22 DIAGNOSIS — Z510 Encounter for antineoplastic radiation therapy: Secondary | ICD-10-CM | POA: Diagnosis not present

## 2021-11-22 DIAGNOSIS — C060 Malignant neoplasm of cheek mucosa: Secondary | ICD-10-CM | POA: Diagnosis not present

## 2021-11-23 ENCOUNTER — Inpatient Hospital Stay: Payer: PPO

## 2021-11-23 ENCOUNTER — Ambulatory Visit
Admission: RE | Admit: 2021-11-23 | Discharge: 2021-11-23 | Disposition: A | Discharge status: Home / Self Care | Payer: PPO | Source: Ambulatory Visit | Attending: Radiation Oncology | Admitting: Radiation Oncology

## 2021-11-23 DIAGNOSIS — Z510 Encounter for antineoplastic radiation therapy: Secondary | ICD-10-CM | POA: Diagnosis not present

## 2021-11-23 DIAGNOSIS — C029 Malignant neoplasm of tongue, unspecified: Secondary | ICD-10-CM

## 2021-11-23 DIAGNOSIS — C060 Malignant neoplasm of cheek mucosa: Secondary | ICD-10-CM | POA: Insufficient documentation

## 2021-11-23 LAB — CBC
HCT: 44.4 % (ref 39.0–52.0)
Hemoglobin: 15.1 g/dL (ref 13.0–17.0)
MCH: 32.8 pg (ref 26.0–34.0)
MCHC: 34 g/dL (ref 30.0–36.0)
MCV: 96.5 fL (ref 80.0–100.0)
Platelets: 146 10*3/uL — ABNORMAL LOW (ref 150–400)
RBC: 4.6 MIL/uL (ref 4.22–5.81)
RDW: 12.9 % (ref 11.5–15.5)
WBC: 8.1 10*3/uL (ref 4.0–10.5)
nRBC: 0 % (ref 0.0–0.2)

## 2021-11-24 ENCOUNTER — Ambulatory Visit
Admission: RE | Admit: 2021-11-24 | Discharge: 2021-11-24 | Disposition: A | Discharge status: Home / Self Care | Payer: PPO | Source: Ambulatory Visit | Attending: Radiation Oncology | Admitting: Radiation Oncology

## 2021-11-24 DIAGNOSIS — Z510 Encounter for antineoplastic radiation therapy: Secondary | ICD-10-CM | POA: Diagnosis not present

## 2021-11-24 DIAGNOSIS — C060 Malignant neoplasm of cheek mucosa: Secondary | ICD-10-CM | POA: Diagnosis not present

## 2021-11-25 ENCOUNTER — Ambulatory Visit
Admission: RE | Admit: 2021-11-25 | Discharge: 2021-11-25 | Disposition: A | Discharge status: Home / Self Care | Payer: PPO | Source: Ambulatory Visit | Attending: Radiation Oncology | Admitting: Radiation Oncology

## 2021-11-25 DIAGNOSIS — C060 Malignant neoplasm of cheek mucosa: Secondary | ICD-10-CM | POA: Diagnosis not present

## 2021-11-25 DIAGNOSIS — Z510 Encounter for antineoplastic radiation therapy: Secondary | ICD-10-CM | POA: Diagnosis not present

## 2021-11-28 ENCOUNTER — Ambulatory Visit
Admission: RE | Admit: 2021-11-28 | Discharge: 2021-11-28 | Disposition: A | Discharge status: Home / Self Care | Payer: PPO | Source: Ambulatory Visit | Attending: Radiation Oncology | Admitting: Radiation Oncology

## 2021-11-28 DIAGNOSIS — C060 Malignant neoplasm of cheek mucosa: Secondary | ICD-10-CM | POA: Diagnosis not present

## 2021-11-28 DIAGNOSIS — Z510 Encounter for antineoplastic radiation therapy: Secondary | ICD-10-CM | POA: Diagnosis not present

## 2021-11-29 ENCOUNTER — Inpatient Hospital Stay: Payer: PPO

## 2021-11-29 ENCOUNTER — Ambulatory Visit
Admission: RE | Admit: 2021-11-29 | Discharge: 2021-11-29 | Disposition: A | Discharge status: Home / Self Care | Payer: PPO | Source: Ambulatory Visit | Attending: Radiation Oncology | Admitting: Radiation Oncology

## 2021-11-29 DIAGNOSIS — C060 Malignant neoplasm of cheek mucosa: Secondary | ICD-10-CM | POA: Diagnosis not present

## 2021-11-29 DIAGNOSIS — Z510 Encounter for antineoplastic radiation therapy: Secondary | ICD-10-CM | POA: Diagnosis not present

## 2021-11-29 NOTE — Progress Notes (Signed)
Nutrition Assessment ? ? ?Reason for Assessment:  ?Referral for oral cavity cancer  ? ? ?ASSESSMENT:  ?81 year old male with squamous cell carcinoma of oral cavity/buccal mucosa.  Past medical history of tonsillar cancer in 2011 s/p radiation and chemotherapy as well as G-tube placement and removal, CAD, CLD, COVID.   ? ?Met with patient following radiation.  Patient reports that he has been drinking glucerna therapeutic shakes about 4 times per day (220 calories, 10 g protein) and was purchasing them from a local DME but they are no longer carrying those shakes.  He has 2 cases left and is wanting to order more.  Overall, he is eating solid foods (cheerios for breakfast and banana, hamburger and fries.  Mouth is sore where ulcer is located.  Patient is very familiar with side effects from treatment and wants to purchase more shakes.   ? ? ? ?Medications: reviewed ? ? ?Labs: reviewed ? ? ?Anthropometrics:  ? ?Height: 74 inches ?Weight: 186 lb 7 oz on 4/4 per [facility] ?193 lb on 09/02/20 ?BMI: 23 ?4% weight loss in the last 3 months, concerning ? ?Estimated Energy Needs ? ?Kcals: 2100-2500 ?Protein: 105-125 g ?Fluid: 2100-2500 ? ? ?NUTRITION DIAGNOSIS: Inadequate oral intake related to cancer as evidenced by 4% weight loss in the last 3 months ? ? ?INTERVENTION:  ?RD to contact local DMEs and see if glucerna therapeutic shake is available to purchase.  ?Contact information provided ? ? ?MONITORING, EVALUATION, GOAL: weight trends, intake ? ? ?Next Visit: Wednesday, April 26 after radiation ? ?Faline Langer B. Zenia Resides, RD, LDN ?Registered Dietitian ?[phone] ? ? ? ? ? ?

## 2021-11-30 ENCOUNTER — Ambulatory Visit
Admission: RE | Admit: 2021-11-30 | Discharge: 2021-11-30 | Disposition: A | Discharge status: Home / Self Care | Payer: PPO | Source: Ambulatory Visit | Attending: Radiation Oncology | Admitting: Radiation Oncology

## 2021-11-30 ENCOUNTER — Inpatient Hospital Stay: Payer: PPO

## 2021-11-30 DIAGNOSIS — Z510 Encounter for antineoplastic radiation therapy: Secondary | ICD-10-CM | POA: Diagnosis not present

## 2021-11-30 DIAGNOSIS — C060 Malignant neoplasm of cheek mucosa: Secondary | ICD-10-CM | POA: Diagnosis not present

## 2021-11-30 DIAGNOSIS — C029 Malignant neoplasm of tongue, unspecified: Secondary | ICD-10-CM

## 2021-11-30 LAB — CBC
HCT: 44.7 % (ref 39.0–52.0)
Hemoglobin: 15.3 g/dL (ref 13.0–17.0)
MCH: 32.6 pg (ref 26.0–34.0)
MCHC: 34.2 g/dL (ref 30.0–36.0)
MCV: 95.3 fL (ref 80.0–100.0)
Platelets: 138 10*3/uL — ABNORMAL LOW (ref 150–400)
RBC: 4.69 MIL/uL (ref 4.22–5.81)
RDW: 12.5 % (ref 11.5–15.5)
WBC: 8.6 10*3/uL (ref 4.0–10.5)
nRBC: 0 % (ref 0.0–0.2)

## 2021-11-30 NOTE — Progress Notes (Signed)
Nutrition ? ?Phillip [name], representative from [facility] will be able to assist patient with receiving glucerna therapeutic shake.   ? ?Written information left at radiation today for patient to contact Phillip [name] to discuss further.   ? ?RD has also requested samples from Phillip [name] Glucose Support 1.2 for patient to try.  ? ?Zaynab Chipman B. Zenia Resides, RD, LDN ?Registered Dietitian ?[phone] ? ?

## 2021-12-01 ENCOUNTER — Ambulatory Visit
Admission: RE | Admit: 2021-12-01 | Discharge: 2021-12-01 | Disposition: A | Discharge status: Home / Self Care | Payer: PPO | Source: Ambulatory Visit | Attending: Radiation Oncology | Admitting: Radiation Oncology

## 2021-12-01 DIAGNOSIS — Z510 Encounter for antineoplastic radiation therapy: Secondary | ICD-10-CM | POA: Diagnosis not present

## 2021-12-01 DIAGNOSIS — C060 Malignant neoplasm of cheek mucosa: Secondary | ICD-10-CM | POA: Diagnosis not present

## 2021-12-02 ENCOUNTER — Ambulatory Visit
Admission: RE | Admit: 2021-12-02 | Discharge: 2021-12-02 | Disposition: A | Discharge status: Home / Self Care | Payer: PPO | Source: Ambulatory Visit | Attending: Radiation Oncology | Admitting: Radiation Oncology

## 2021-12-02 DIAGNOSIS — Z510 Encounter for antineoplastic radiation therapy: Secondary | ICD-10-CM | POA: Diagnosis not present

## 2021-12-02 DIAGNOSIS — C060 Malignant neoplasm of cheek mucosa: Secondary | ICD-10-CM | POA: Diagnosis not present

## 2021-12-05 ENCOUNTER — Ambulatory Visit
Admission: RE | Admit: 2021-12-05 | Discharge: 2021-12-05 | Disposition: A | Discharge status: Home / Self Care | Payer: PPO | Source: Ambulatory Visit | Attending: Radiation Oncology | Admitting: Radiation Oncology

## 2021-12-05 DIAGNOSIS — Z510 Encounter for antineoplastic radiation therapy: Secondary | ICD-10-CM | POA: Diagnosis not present

## 2021-12-05 DIAGNOSIS — C060 Malignant neoplasm of cheek mucosa: Secondary | ICD-10-CM | POA: Diagnosis not present

## 2021-12-06 ENCOUNTER — Ambulatory Visit
Admission: RE | Admit: 2021-12-06 | Discharge: 2021-12-06 | Disposition: A | Discharge status: Home / Self Care | Payer: PPO | Source: Ambulatory Visit | Attending: Radiation Oncology | Admitting: Radiation Oncology

## 2021-12-06 ENCOUNTER — Other Ambulatory Visit: Payer: Self-pay

## 2021-12-06 DIAGNOSIS — C060 Malignant neoplasm of cheek mucosa: Secondary | ICD-10-CM | POA: Diagnosis not present

## 2021-12-06 DIAGNOSIS — Z510 Encounter for antineoplastic radiation therapy: Secondary | ICD-10-CM | POA: Diagnosis not present

## 2021-12-06 LAB — RAD ONC ARIA SESSION SUMMARY
Course Elapsed Days: 28
Plan Fractions Treated to Date: 21
Plan Prescribed Dose Per Fraction: 2 Gy
Plan Total Fractions Prescribed: 30
Plan Total Prescribed Dose: 60 Gy
Reference Point Dosage Given to Date: 42 Gy
Reference Point Session Dosage Given: 2 Gy
Session Number: 21

## 2021-12-07 ENCOUNTER — Inpatient Hospital Stay: Payer: PPO

## 2021-12-07 ENCOUNTER — Other Ambulatory Visit: Payer: Self-pay

## 2021-12-07 ENCOUNTER — Ambulatory Visit
Admission: RE | Admit: 2021-12-07 | Discharge: 2021-12-07 | Disposition: A | Discharge status: Home / Self Care | Payer: PPO | Source: Ambulatory Visit | Attending: Radiation Oncology | Admitting: Radiation Oncology

## 2021-12-07 DIAGNOSIS — Z510 Encounter for antineoplastic radiation therapy: Secondary | ICD-10-CM | POA: Diagnosis not present

## 2021-12-07 DIAGNOSIS — C029 Malignant neoplasm of tongue, unspecified: Secondary | ICD-10-CM

## 2021-12-07 DIAGNOSIS — C060 Malignant neoplasm of cheek mucosa: Secondary | ICD-10-CM | POA: Diagnosis not present

## 2021-12-07 LAB — CBC
HCT: 43.1 % (ref 39.0–52.0)
Hemoglobin: 14.8 g/dL (ref 13.0–17.0)
MCH: 32.8 pg (ref 26.0–34.0)
MCHC: 34.3 g/dL (ref 30.0–36.0)
MCV: 95.6 fL (ref 80.0–100.0)
Platelets: 140 10*3/uL — ABNORMAL LOW (ref 150–400)
RBC: 4.51 MIL/uL (ref 4.22–5.81)
RDW: 12.6 % (ref 11.5–15.5)
WBC: 8.3 10*3/uL (ref 4.0–10.5)
nRBC: 0 % (ref 0.0–0.2)

## 2021-12-07 LAB — RAD ONC ARIA SESSION SUMMARY
Course Elapsed Days: 29
Plan Fractions Treated to Date: 22
Plan Prescribed Dose Per Fraction: 2 Gy
Plan Total Fractions Prescribed: 30
Plan Total Prescribed Dose: 60 Gy
Reference Point Dosage Given to Date: 44 Gy
Reference Point Session Dosage Given: 2 Gy
Session Number: 22

## 2021-12-08 ENCOUNTER — Ambulatory Visit
Admission: RE | Admit: 2021-12-08 | Discharge: 2021-12-08 | Disposition: A | Discharge status: Home / Self Care | Payer: PPO | Source: Ambulatory Visit | Attending: Radiation Oncology | Admitting: Radiation Oncology

## 2021-12-08 ENCOUNTER — Other Ambulatory Visit: Payer: Self-pay

## 2021-12-08 DIAGNOSIS — Z510 Encounter for antineoplastic radiation therapy: Secondary | ICD-10-CM | POA: Diagnosis not present

## 2021-12-08 DIAGNOSIS — C060 Malignant neoplasm of cheek mucosa: Secondary | ICD-10-CM | POA: Diagnosis not present

## 2021-12-08 LAB — RAD ONC ARIA SESSION SUMMARY
Course Elapsed Days: 30
Plan Fractions Treated to Date: 23
Plan Prescribed Dose Per Fraction: 2 Gy
Plan Total Fractions Prescribed: 30
Plan Total Prescribed Dose: 60 Gy
Reference Point Dosage Given to Date: 46 Gy
Reference Point Session Dosage Given: 2 Gy
Session Number: 23

## 2021-12-09 ENCOUNTER — Ambulatory Visit
Admission: RE | Admit: 2021-12-09 | Discharge: 2021-12-09 | Disposition: A | Discharge status: Home / Self Care | Payer: PPO | Source: Ambulatory Visit | Attending: Radiation Oncology | Admitting: Radiation Oncology

## 2021-12-09 ENCOUNTER — Other Ambulatory Visit: Payer: Self-pay

## 2021-12-09 DIAGNOSIS — Z510 Encounter for antineoplastic radiation therapy: Secondary | ICD-10-CM | POA: Diagnosis not present

## 2021-12-09 DIAGNOSIS — C060 Malignant neoplasm of cheek mucosa: Secondary | ICD-10-CM | POA: Diagnosis not present

## 2021-12-09 LAB — RAD ONC ARIA SESSION SUMMARY
Course Elapsed Days: 31
Plan Fractions Treated to Date: 24
Plan Prescribed Dose Per Fraction: 2 Gy
Plan Total Fractions Prescribed: 30
Plan Total Prescribed Dose: 60 Gy
Reference Point Dosage Given to Date: 48 Gy
Reference Point Session Dosage Given: 2 Gy
Session Number: 24

## 2021-12-12 ENCOUNTER — Other Ambulatory Visit: Payer: Self-pay

## 2021-12-12 ENCOUNTER — Ambulatory Visit
Admission: RE | Admit: 2021-12-12 | Discharge: 2021-12-12 | Disposition: A | Discharge status: Home / Self Care | Payer: PPO | Source: Ambulatory Visit | Attending: Radiation Oncology | Admitting: Radiation Oncology

## 2021-12-12 DIAGNOSIS — Z510 Encounter for antineoplastic radiation therapy: Secondary | ICD-10-CM | POA: Diagnosis not present

## 2021-12-12 DIAGNOSIS — C060 Malignant neoplasm of cheek mucosa: Secondary | ICD-10-CM | POA: Diagnosis not present

## 2021-12-12 LAB — RAD ONC ARIA SESSION SUMMARY
Course Elapsed Days: 34
Plan Fractions Treated to Date: 25
Plan Prescribed Dose Per Fraction: 2 Gy
Plan Total Fractions Prescribed: 30
Plan Total Prescribed Dose: 60 Gy
Reference Point Dosage Given to Date: 50 Gy
Reference Point Session Dosage Given: 2 Gy
Session Number: 25

## 2021-12-13 ENCOUNTER — Other Ambulatory Visit: Payer: Self-pay

## 2021-12-13 ENCOUNTER — Ambulatory Visit
Admission: RE | Admit: 2021-12-13 | Discharge: 2021-12-13 | Disposition: A | Discharge status: Home / Self Care | Payer: PPO | Source: Ambulatory Visit | Attending: Radiation Oncology | Admitting: Radiation Oncology

## 2021-12-13 DIAGNOSIS — Z510 Encounter for antineoplastic radiation therapy: Secondary | ICD-10-CM | POA: Diagnosis not present

## 2021-12-13 DIAGNOSIS — C060 Malignant neoplasm of cheek mucosa: Secondary | ICD-10-CM | POA: Diagnosis not present

## 2021-12-13 LAB — RAD ONC ARIA SESSION SUMMARY
Course Elapsed Days: 35
Plan Fractions Treated to Date: 26
Plan Prescribed Dose Per Fraction: 2 Gy
Plan Total Fractions Prescribed: 30
Plan Total Prescribed Dose: 60 Gy
Reference Point Dosage Given to Date: 52 Gy
Reference Point Session Dosage Given: 2 Gy
Session Number: 26

## 2021-12-14 ENCOUNTER — Inpatient Hospital Stay: Payer: PPO

## 2021-12-14 ENCOUNTER — Ambulatory Visit
Admission: RE | Admit: 2021-12-14 | Discharge: 2021-12-14 | Disposition: A | Discharge status: Home / Self Care | Payer: PPO | Source: Ambulatory Visit | Attending: Radiation Oncology | Admitting: Radiation Oncology

## 2021-12-14 ENCOUNTER — Other Ambulatory Visit: Payer: Self-pay

## 2021-12-14 DIAGNOSIS — C029 Malignant neoplasm of tongue, unspecified: Secondary | ICD-10-CM

## 2021-12-14 DIAGNOSIS — C060 Malignant neoplasm of cheek mucosa: Secondary | ICD-10-CM | POA: Diagnosis not present

## 2021-12-14 DIAGNOSIS — Z510 Encounter for antineoplastic radiation therapy: Secondary | ICD-10-CM | POA: Diagnosis not present

## 2021-12-14 LAB — RAD ONC ARIA SESSION SUMMARY
Course Elapsed Days: 36
Plan Fractions Treated to Date: 27
Plan Prescribed Dose Per Fraction: 2 Gy
Plan Total Fractions Prescribed: 30
Plan Total Prescribed Dose: 60 Gy
Reference Point Dosage Given to Date: 54 Gy
Reference Point Session Dosage Given: 2 Gy
Session Number: 27

## 2021-12-14 LAB — CBC
HCT: 44 % (ref 39.0–52.0)
Hemoglobin: 15 g/dL (ref 13.0–17.0)
MCH: 33.1 pg (ref 26.0–34.0)
MCHC: 34.1 g/dL (ref 30.0–36.0)
MCV: 97.1 fL (ref 80.0–100.0)
Platelets: 156 10*3/uL (ref 150–400)
RBC: 4.53 MIL/uL (ref 4.22–5.81)
RDW: 12.7 % (ref 11.5–15.5)
WBC: 8.2 10*3/uL (ref 4.0–10.5)
nRBC: 0 % (ref 0.0–0.2)

## 2021-12-14 NOTE — Progress Notes (Signed)
Nutrition ? ?Patient left following radiation and did not stay for nutrition follow-up appointment.   ? ?RD called patient and left message with RD's call back number.  ? ?Ladeidra Borys B. Zenia Resides, RD, LDN ?Registered Dietitian ?[phone] ? ?

## 2021-12-15 ENCOUNTER — Ambulatory Visit
Admission: RE | Admit: 2021-12-15 | Discharge: 2021-12-15 | Disposition: A | Discharge status: Home / Self Care | Payer: PPO | Source: Ambulatory Visit | Attending: Radiation Oncology | Admitting: Radiation Oncology

## 2021-12-15 ENCOUNTER — Inpatient Hospital Stay: Payer: PPO

## 2021-12-15 ENCOUNTER — Other Ambulatory Visit: Payer: Self-pay

## 2021-12-15 DIAGNOSIS — Z510 Encounter for antineoplastic radiation therapy: Secondary | ICD-10-CM | POA: Diagnosis not present

## 2021-12-15 DIAGNOSIS — C060 Malignant neoplasm of cheek mucosa: Secondary | ICD-10-CM | POA: Diagnosis not present

## 2021-12-15 LAB — RAD ONC ARIA SESSION SUMMARY
Course Elapsed Days: 37
Plan Fractions Treated to Date: 28
Plan Prescribed Dose Per Fraction: 2 Gy
Plan Total Fractions Prescribed: 30
Plan Total Prescribed Dose: 60 Gy
Reference Point Dosage Given to Date: 56 Gy
Reference Point Session Dosage Given: 2 Gy
Session Number: 28

## 2021-12-15 NOTE — Progress Notes (Signed)
Nutrition Follow-up: ? ?Patient with squamous cell carcinoma of oral cavity/buccal mucosa. Patient receiving radiation alone.  Previous G-tube placement for tonsillar cancer in 2011 s/p radiation and chemotherapy.   ? ?Patient returned RD's phone call.  Spoke with wife and patient on speaker phone.  Patient spoke with [facility] regarding Glucerna Therapeutic shake (220 calories and 10 g protein) and more expensive than what he has found at [facility] shake of equal value).  Patient drinking 12 shakes per day at this time. Patient with sore mouth, oral mucositis and eating some soft solids but mainly drinking shakes.   ? ? ? ?Medications: reviewed ? ?Labs: reviewed ? ?Anthropometrics:  ? ?Weight per [facility] on 4/25 182 lb 3 oz ?186 lb 7 oz on 4/4 per [facility] ?193 lb on 09/02/20 ? ? ?NUTRITION DIAGNOSIS: Inadequate oral intake continues due to side effects from treatment ? ? ?INTERVENTION:  ?Discussed option of glucerna 1.5 (356 calories, 19.6 g protein, more nutritionally complete product, for sole source nutrition) higher calorie, less shakes to drink.  Samples left at radiation for patient to try.  Also provided option of [facility] Glucose Support 1.2 (300 calorie, appropriate for sole source nutrition).  Written information for how to obtain [facility] shake as well provided in bag with samples.    ?Due to patients past experience with cancer, he feels confident that he knows what to do regarding nutrition.  RD available if patient has further questions or concerns.   ? ? ? ?NEXT VISIT: no follow-up ?RD available if needed ? ?Phillip [name], RD, LDN ?Registered Dietitian ?[phone] ? ? ?

## 2021-12-16 ENCOUNTER — Ambulatory Visit
Admission: RE | Admit: 2021-12-16 | Discharge: 2021-12-16 | Disposition: A | Discharge status: Home / Self Care | Payer: PPO | Source: Ambulatory Visit | Attending: Radiation Oncology | Admitting: Radiation Oncology

## 2021-12-16 ENCOUNTER — Other Ambulatory Visit: Payer: Self-pay

## 2021-12-16 DIAGNOSIS — Z510 Encounter for antineoplastic radiation therapy: Secondary | ICD-10-CM | POA: Diagnosis not present

## 2021-12-16 DIAGNOSIS — C060 Malignant neoplasm of cheek mucosa: Secondary | ICD-10-CM | POA: Diagnosis not present

## 2021-12-16 LAB — RAD ONC ARIA SESSION SUMMARY
Course Elapsed Days: 38
Plan Fractions Treated to Date: 29
Plan Prescribed Dose Per Fraction: 2 Gy
Plan Total Fractions Prescribed: 30
Plan Total Prescribed Dose: 60 Gy
Reference Point Dosage Given to Date: 58 Gy
Reference Point Session Dosage Given: 2 Gy
Session Number: 29

## 2021-12-19 ENCOUNTER — Other Ambulatory Visit: Payer: Self-pay

## 2021-12-19 ENCOUNTER — Ambulatory Visit
Admission: RE | Admit: 2021-12-19 | Discharge: 2021-12-19 | Disposition: A | Discharge status: Home / Self Care | Payer: PPO | Source: Ambulatory Visit | Attending: Radiation Oncology | Admitting: Radiation Oncology

## 2021-12-19 DIAGNOSIS — Z510 Encounter for antineoplastic radiation therapy: Secondary | ICD-10-CM | POA: Diagnosis not present

## 2021-12-19 DIAGNOSIS — C060 Malignant neoplasm of cheek mucosa: Secondary | ICD-10-CM | POA: Insufficient documentation

## 2021-12-19 LAB — RAD ONC ARIA SESSION SUMMARY
Course Elapsed Days: 41
Plan Fractions Treated to Date: 30
Plan Prescribed Dose Per Fraction: 2 Gy
Plan Total Fractions Prescribed: 30
Plan Total Prescribed Dose: 60 Gy
Reference Point Dosage Given to Date: 60 Gy
Reference Point Session Dosage Given: 2 Gy
Session Number: 30

## 2021-12-22 ENCOUNTER — Telehealth: Payer: Self-pay | Admitting: Family Medicine

## 2021-12-22 DIAGNOSIS — R4182 Altered mental status, unspecified: Secondary | ICD-10-CM | POA: Diagnosis not present

## 2021-12-22 DIAGNOSIS — Z85818 Personal history of malignant neoplasm of other sites of lip, oral cavity, and pharynx: Secondary | ICD-10-CM | POA: Diagnosis not present

## 2021-12-22 NOTE — Telephone Encounter (Addendum)
Will discuss at [place].  ?Consider prozac trial and psych referral for intermittent explosive disorder. ?

## 2021-12-22 NOTE — Telephone Encounter (Signed)
Pts wife requesting call back from nurse on pts health. Callback is [phone] ?

## 2021-12-22 NOTE — Telephone Encounter (Signed)
Spoke with pt's wife, Phillip [name] (on dpr), rtn her call.  States she is concerned about pt's verbal aggression and aggressive behavior is worsening.  Says yesterday, pt went to Dr. Ileene Hutchinson [facility] ENT) office and was verbally aggressive with front office staff, so much so that he upset them and "security" had to calm him down.  Dr. Tami Ribas agreed to see pt to to discuss the behavior suggested to pt's wife, Phillip [name], call Dr. Darnell Level to get pt some mental health help.  Phillip [name] states she's tried to deal with it for >12 yrs now and can't handle it getting worse.  Pt is scheduled for OV on 12/27/21 at 11:00.  Fyi to Dr. Darnell Level.  ?

## 2021-12-23 ENCOUNTER — Telehealth: Payer: Self-pay | Admitting: Family Medicine

## 2021-12-23 NOTE — Telephone Encounter (Signed)
Rtn pt's wife's call.  States Dr. Tami Ribas (ENT) and Dr. Massie Maroon (oncology) both agreed, after Tues's incident, pt needs to see neuro. Pt is being referred to Dr. Melrose Nakayama [facility] neuro).  Says pt has not been scheduled yet.  Wants to keep 12/28/21 OV with Dr. Darnell Level for now.  If she hears from Dr. Lannie Fields office before then, she will call back and c/x OV. Fyi to Dr. Darnell Level. ?

## 2021-12-23 NOTE — Telephone Encounter (Signed)
Pts wife called wanting to speak to cma about pts appt on 12/28/21, call back is [phone] ?

## 2021-12-27 ENCOUNTER — Other Ambulatory Visit: Payer: Self-pay | Admitting: Internal Medicine

## 2021-12-27 ENCOUNTER — Ambulatory Visit: Payer: PPO | Admitting: Family Medicine

## 2021-12-27 DIAGNOSIS — I4891 Unspecified atrial fibrillation: Secondary | ICD-10-CM

## 2021-12-27 NOTE — Telephone Encounter (Signed)
Refill request

## 2021-12-27 NOTE — Telephone Encounter (Signed)
Prescription refill request for Eliquis received. ?Indication: Atrial Fib ?Last office visit: 09/02/21  C End MD ?Scr: 0.77 on 05/13/21 ?Age: 81 ?Weight: 87.5kg ? ?Based on above findings Eliquis '5mg'$  twice daily is the appropriate dose.  Refill approved. ? ?

## 2021-12-28 ENCOUNTER — Ambulatory Visit (INDEPENDENT_AMBULATORY_CARE_PROVIDER_SITE_OTHER): Payer: PPO | Admitting: Family Medicine

## 2021-12-28 ENCOUNTER — Encounter: Payer: Self-pay | Admitting: Family Medicine

## 2021-12-28 VITALS — BP 128/76 | HR 63 | Temp 97.9°F | Ht 74.0 in | Wt 179.0 lb

## 2021-12-28 DIAGNOSIS — C060 Malignant neoplasm of cheek mucosa: Secondary | ICD-10-CM | POA: Diagnosis not present

## 2021-12-28 DIAGNOSIS — R454 Irritability and anger: Secondary | ICD-10-CM | POA: Diagnosis not present

## 2021-12-28 DIAGNOSIS — F6381 Intermittent explosive disorder: Secondary | ICD-10-CM | POA: Diagnosis not present

## 2021-12-28 MED ORDER — FLUOXETINE HCL 20 MG PO CAPS: 20.0000 mg | ORAL_CAPSULE | Freq: Every day | ORAL | 6 refills | Status: AC

## 2021-12-28 NOTE — Patient Instructions (Signed)
Stop sertraline. Start prozac '20mg'$  daily.  ?I want you to see psychiatrist for evaluation of anger/outbursts. We will call you with information for this.  ?Keep neurology appointment.  ?

## 2021-12-28 NOTE — Progress Notes (Signed)
? ? Patient ID: Phillip [name], male    DOB: [birth year], 81 y.o.   MRN: [id number] ? ?This visit was conducted in person. ? ?BP 128/76   Pulse 63   Temp 97.9 ?F (36.6 ?C) (Temporal)   Ht '6\' 2"'$  (1.88 m)   Wt 179 lb (81.2 kg)   SpO2 95%   BMI 22.98 kg/m?   ? ?CC: discuss mental health  ?Subjective:  ? ?HPI: ?Phillip [name] is a 81 y.o. male presenting on 12/28/2021 for Mental Health Problem (Per pt's wife, Fraser Din, pt's mental health and behavior is worsening. Pt accompanied by wife, Fraser Din. She wants to discuss options for tx. ) ? ? ?Longstanding issue with irritability and anger, per wife present >12 yrs. He feels it is his responsibility to correct societal wrongs as he perceives them. Previously underwent counseling which was not helpful. He doesn't note any trouble and says this is his nature. This has caused marital stress. Sertraline started 10/2020 with some benefit noted. Never memory changes. Latest MMSE 27/30, CDT 4/4 (11/2020).  ? ?Recent episode at ENT office where he was verbally aggressive with front office staff. Wife was not present. After episode at ENT office, both Dr Tami Ribas and Dr Baruch Gouty recommended evaluation by neurology - has been referred to Dr Melrose Nakayama at [facility]. They are awaiting evaluation.  ? ?Denies personality or mood changes, auditory/visual hallucinations, fever, headache.  ? ?H/o tonsillar cancer s/p radiation/chemo 2011.  ? ?New squamous cell cancer of R cheek 2023 s/p XRT, no chemo was needed. Latest PET reassuring however he notes R cheek swelling persists and swelling may have moved anteriorly, very painful. He is only managing this with tylenol and ibuprofen as well as miracle mouthwash and hurricane spray, has not significantly used tramadol prescription provided.  ? ?Regularly seeing Dr Tami Ribas, upcoming appt 5/18. He's taking prednisone taper and clindamycin course through ENT.  ? ?New onset afib 03/2021 continues Eliquis. He did have successful cardioversion 04/2021, now  off Toprol XL.  ?   ? ?Relevant past medical, surgical, family and social history reviewed and updated as indicated. Interim medical history since our last visit reviewed. ?Allergies and medications reviewed and updated. ?Outpatient Medications Prior to Visit  ?Medication Sig Dispense Refill  ? amLODipine (NORVASC) 10 MG tablet Take 1 tablet by mouth once daily 90 tablet 0  ? apixaban (ELIQUIS) 5 MG TABS tablet Take 1 tablet by mouth twice daily 60 tablet 5  ? Cholecalciferol (VITAMIN D3) 25 MCG (1000 UT) CAPS Take 1 capsule by mouth daily.    ? docusate sodium (COLACE) 250 MG capsule Takes total '600mg'$  /day    ? fluocinonide gel (LIDEX) 0.05 % SMARTSIG:Sparingly Topical 3 Times Daily    ? furosemide (LASIX) 20 MG tablet Take 1 tablet by mouth once daily 30 tablet 2  ? HURRICAINE 20 % AERO as needed.    ? levothyroxine (EUTHYROX) 50 MCG tablet Take 1 tablet (50 mcg total) by mouth daily before breakfast. 30 tablet 8  ? MAGNESIUM PO Take 1 tablet by mouth daily.    ? Multiple Vitamins-Minerals (ZINC PO) Take by mouth daily.    ? nitroGLYCERIN (NITROSTAT) 0.4 MG SL tablet Place 1 tablet (0.4 mg total) under the tongue every 5 (five) minutes as needed for chest pain (Maximum 3 doses.). 25 tablet 2  ? Omega-3 Fatty Acids (OMEGA 3 PO) Take by mouth daily.    ? ondansetron (ZOFRAN-ODT) 4 MG disintegrating tablet Take 1 tablet (4 mg total) by  mouth every 8 (eight) hours as needed for nausea or vomiting. 20 tablet 0  ? polyethylene glycol (MIRALAX / GLYCOLAX) 17 g packet Take 17 g by mouth daily as needed.    ? Potassium 99 MG TABS Take 1 tablet by mouth daily.    ? rosuvastatin (CRESTOR) 20 MG tablet Take 1 tablet by mouth once daily 90 tablet 0  ? traMADol (ULTRAM) 50 MG tablet TAKE 1 TABLET BY MOUTH EVERY 6 HOURS AS NEEDED 30 tablet 0  ? VITAMIN A PO Take 2,400 mcg by mouth daily.     ? vitamin E 400 UNIT capsule Take 400 Units by mouth daily.    ? zaleplon (SONATA) 10 MG capsule Take 1 capsule (10 mg total) by mouth at  bedtime as needed for sleep. 30 capsule 0  ? Zinc 50 MG CAPS Take 50 mg by mouth daily.    ? sertraline (ZOLOFT) 25 MG tablet Take 1 tablet by mouth once daily 90 tablet 1  ? clindamycin (CLEOCIN) 300 MG capsule Take 300 mg by mouth 3 (three) times daily.    ? predniSONE (DELTASONE) 10 MG tablet Take by mouth.    ? ?No facility-administered medications prior to visit.  ?  ? ?Per HPI unless specifically indicated in ROS section below ?Review of Systems ? ?Objective:  ?BP 128/76   Pulse 63   Temp 97.9 ?F (36.6 ?C) (Temporal)   Ht '6\' 2"'$  (1.88 m)   Wt 179 lb (81.2 kg)   SpO2 95%   BMI 22.98 kg/m?   ?Wt Readings from Last 3 Encounters:  ?12/28/21 179 lb (81.2 kg)  ?11/09/21 189 lb 14.4 oz (86.1 kg)  ?11/02/21 189 lb 2 oz (85.8 kg)  ?  ?  ?Physical Exam ?Vitals and nursing note reviewed.  ?Constitutional:   ?   Appearance: Normal appearance. He is not ill-appearing.  ?HENT:  ?   Mouth/Throat:  ?   Lips: Pink.  ?   Mouth: Oral lesions present.  ?   Tongue: No lesions.  ?   Comments:  ?Chronic maceration to R inner cheek ?Swelling/edema to R cheek into R lip angle ?Neurological:  ?   Mental Status: He is alert.  ?Psychiatric:     ?   Attention and Perception: Attention and perception normal.     ?   Speech: Speech normal.     ?   Behavior: Behavior is cooperative.     ?   Cognition and Memory: Cognition and memory normal.     ?   Judgment: Judgment is impulsive.  ?   Comments: Irritable affect   ? ?   ? ?Assessment & Plan:  ?I spent 49 minutes caring for this patient today face to face, reviewing labs, prior records from another facility, performing a medically appropriate examination and/or evaluation, counseling and educating the patient on recent behavioral issues, documenting in the record and arranging for follow up with psychatrist.  ? ?Problem List Items Addressed This Visit   ? ? Irritability and anger  ?  On sertraline '25mg'$  since 10/2020 for irritability/anger outbursts with some benefit however behavior has  continued. Will change SSRI to prozac '20mg'$  daily which may have more effectiveness to treat possible intermittent explosive disorder/anger management issue as per below. Anticipate a personality issue which may make it very difficult to affect change.  ?Extensive amount of time spent counseling pt/wife as well as discussing effects of his behavior on family/friends.  ? ?  ?  ? Relevant  Orders  ? Ambulatory referral to Psychiatry  ? 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  Appreciate rad onc care - he's completed 6 weeks of of XRT with latest PET reassuring. He does have ENT and rad/onc f/u planned over the next month to ensure cancer has been fully treated. He is currently completing a course of prednisone/clindamycin through ENT..  ? ?  ?  ? Relevant Medications  ? clindamycin (CLEOCIN) 300 MG capsule  ? predniSONE (DELTASONE) 10 MG tablet  ? Intermittent explosive disorder in adult - Primary  ?  See above. This is a longstanding issue, without change through recent oral cancer diagnosis/treatment, and patient exhibits no signs of memory impairment.  I doubt this is neurological in nature or related to brain effect from infection, stroke or cancer. Anticipate component of personality issue involved which may make this very difficult to treat with medication.  I am predominantly interested in psychiatrist evaluation/recommendations, and he agrees to this - referral placed. Reasonable to see neurology for an evaluation - they will schedule this.  ?Discussed possible couples counseling, consider anger management, will also appreciate psychiatry input.  ?  ?  ? Relevant Orders  ? Ambulatory referral to Psychiatry  ?  ? ?Meds ordered this encounter  ?Medications  ? FLUoxetine (PROZAC) 20 MG capsule  ?  Sig: Take 1 capsule (20 mg total) by mouth daily.  ?  Dispense:  30 capsule  ?  Refill:  6  ?  To replace sertraline  ? ?Orders Placed This Encounter  ?Procedures  ? Ambulatory referral to Psychiatry  ?   Referral Priority:   Routine  ?  Referral Type:   Psychiatric  ?  Referral Reason:   Specialty Services Required  ?  Requested Specialty:   Psychiatry  ?  Number of Visits Requested:   1  ? ? ?Patient Instructions  ?

## 2021-12-29 DIAGNOSIS — F6381 Intermittent explosive disorder: Secondary | ICD-10-CM | POA: Insufficient documentation

## 2021-12-29 NOTE — Assessment & Plan Note (Addendum)
On sertraline '25mg'$  since 10/2020 for irritability/anger outbursts with some benefit however behavior has continued. Will change SSRI to prozac '20mg'$  daily which may have more effectiveness to treat possible intermittent explosive disorder/anger management issue as per below. Anticipate a personality issue which may make it very difficult to affect change.  ?Extensive amount of time spent counseling pt/wife as well as discussing effects of his behavior on family/friends.  ?

## 2021-12-29 NOTE — Assessment & Plan Note (Addendum)
See above. This is a longstanding issue, without change through recent oral cancer diagnosis/treatment, and patient exhibits no signs of memory impairment.  I doubt this is neurological in nature or related to brain effect from infection, stroke or cancer. Anticipate component of personality issue involved which may make this very difficult to treat with medication.  I am predominantly interested in psychiatrist evaluation/recommendations, and he agrees to this - referral placed. Reasonable to see neurology for an evaluation - they will schedule this.  ?Discussed possible couples counseling, consider anger management, will also appreciate psychiatry input.  ?

## 2021-12-29 NOTE — Assessment & Plan Note (Signed)
Appreciate rad onc care - he's completed 6 weeks of of XRT with latest PET reassuring. He does have ENT and rad/onc f/u planned over the next month to ensure cancer has been fully treated. He is currently completing a course of prednisone/clindamycin through ENT.Marland Kitchen  ?

## 2022-01-05 ENCOUNTER — Other Ambulatory Visit: Payer: Self-pay | Admitting: Unknown Physician Specialty

## 2022-01-05 DIAGNOSIS — C061 Malignant neoplasm of vestibule of mouth: Secondary | ICD-10-CM | POA: Diagnosis not present

## 2022-01-05 DIAGNOSIS — K121 Other forms of stomatitis: Secondary | ICD-10-CM | POA: Diagnosis not present

## 2022-01-05 DIAGNOSIS — Z85818 Personal history of malignant neoplasm of other sites of lip, oral cavity, and pharynx: Secondary | ICD-10-CM | POA: Diagnosis not present

## 2022-01-09 ENCOUNTER — Other Ambulatory Visit: Payer: Self-pay | Admitting: Anatomic Pathology & Clinical Pathology

## 2022-01-09 LAB — SURGICAL PATHOLOGY

## 2022-01-11 ENCOUNTER — Encounter: Payer: Self-pay | Admitting: Radiation Oncology

## 2022-01-11 ENCOUNTER — Other Ambulatory Visit: Payer: Self-pay | Admitting: *Deleted

## 2022-01-11 ENCOUNTER — Ambulatory Visit
Admission: RE | Admit: 2022-01-11 | Discharge: 2022-01-11 | Disposition: A | Discharge status: Home / Self Care | Payer: PPO | Source: Ambulatory Visit | Attending: Radiation Oncology | Admitting: Radiation Oncology

## 2022-01-11 VITALS — BP 133/71 | HR 65 | Temp 98.0°F | Resp 18 | Ht 74.0 in | Wt 177.0 lb

## 2022-01-11 DIAGNOSIS — C060 Malignant neoplasm of cheek mucosa: Secondary | ICD-10-CM

## 2022-01-11 DIAGNOSIS — C4402 Squamous cell carcinoma of skin of lip: Secondary | ICD-10-CM | POA: Diagnosis not present

## 2022-01-11 DIAGNOSIS — Z9221 Personal history of antineoplastic chemotherapy: Secondary | ICD-10-CM | POA: Insufficient documentation

## 2022-01-11 DIAGNOSIS — Z923 Personal history of irradiation: Secondary | ICD-10-CM | POA: Diagnosis not present

## 2022-01-11 DIAGNOSIS — C029 Malignant neoplasm of tongue, unspecified: Secondary | ICD-10-CM

## 2022-01-11 NOTE — Progress Notes (Signed)
Radiation Oncology Follow up Note  Name: Phillip [name]   Date:   01/11/2022 MRN:  [id number] DOB: 1941-05-15    This 81 y.o. male presents to the clinic today for reevaluation of a new lesion on his right lower lip biopsy-positive for squamous cell carcinoma with basaloid features and patient who just recently completed radiation therapy to his right buccal mucosa for squamous of carcinoma.  REFERRING PROVIDER: [name], MD  HPI: Patient is an 81 year old male who comes pleated concurrent chemoradiation therapy [name] 12 years prior for T1N1 squamous cell carcinoma of the right tonsil.  He had a right buccal mucosal lesion which was ulcerated and extremely painful which we recently radiated biopsy positive for invasive squamous cell carcinoma with well differentiated keratinizing features.  In the meantime he has developed a rapidly growing lesion of the right lower lip biopsy was performed.  Showing invasive squamous cell carcinoma keratinizing type with 1 fragment showing basaloid features with only minimal focal keratinization.  Extremely painful lesion.  He is seen today for evaluation.  COMPLICATIONS OF TREATMENT: none  FOLLOW UP COMPLIANCE: keeps appointments   PHYSICAL EXAM:  BP 133/71 (BP Location: Left Arm, Patient Position: Sitting)   Pulse 65   Temp 98 F (36.7 C) (Tympanic)   Resp 18   Ht '6\' 2"'$  (1.88 m)   Wt 177 lb (80.3 kg)   SpO2 98%   BMI 22.73 kg/m  Patient has a firm indurated lesion of his right lower lip in the area of recent biopsy.  This is out of our previous treatment field.  No evidence of cervical or submental adenopathy is identified.  Area of previous radiation in the right buccal mucosa shows necrotic debris with granulation tissue present.  Well-developed well-nourished patient in NAD. HEENT reveals PERLA, EOMI, discs not visualized.  Oral cavity is clear. No oral mucosal lesions are identified. Neck is clear without evidence of cervical or  supraclavicular adenopathy. Lungs are clear to A&P. Cardiac examination is essentially unremarkable with regular rate and rhythm without murmur rub or thrill. Abdomen is benign with no organomegaly or masses noted. Motor sensory and DTR levels are equal and symmetric in the upper and lower extremities. Cranial nerves II through XII are grossly intact. Proprioception is intact. No peripheral adenopathy or edema is identified. No motor or sensory levels are noted. Crude visual fields are within normal range.  RADIOLOGY RESULTS: PET CT scan ordered  PLAN: Present time of ordered a PET CT scan to see the extent of this lesion.  I believe based on histology and area of involvement this is a separate new lesion of his right lower lip.  Based on PET scan findings I would offer further radiation therapy to this region.  Patient and family member both comprehend my recommendations well.  We have ordered PET CT scan for this week and have tentatively set him up for simulation next week.  We will present his case at tumor conference for further input.  I would like to take this opportunity to thank you for allowing me to participate in the care of your patient.Noreene Filbert, MD

## 2022-01-12 ENCOUNTER — Other Ambulatory Visit: Payer: PPO

## 2022-01-12 NOTE — Progress Notes (Signed)
Tumor Board Documentation  Phillip [name] was presented by Dr Tami Ribas at our Tumor Board on 01/12/2022, which included representatives from surgical, medical oncology, pharmacy, pulmonology, radiology, pathology, navigation, radiation oncology, research, internal medicine, palliative care.  Phillip [name] currently presents as a current patient, for [place], for new positive pathology with history of the following treatments: neoadjuvant chemoradiation, active survellience.  Additionally, we reviewed previous medical and familial history, history of present illness, and recent lab results along with all available histopathologic and imaging studies. The tumor board considered available treatment options and made the following recommendations: Radiation therapy (primary modality), Additional screening (PET Scan) Possible chemotherapy, Dr Janese Banks will decide after PET Scan is resulted  The following procedures/referrals were also placed: No orders of the defined types were placed in this encounter.   Clinical Trial Status: not discussed   Staging used: To be determined AJCC Staging:       Group: Invasive Squamous Cell CArcinoma of Lip New Primary   National site-specific guidelines   were discussed with respect to the case.  Tumor board is a meeting of clinicians from various specialty areas who evaluate and discuss patients for whom a multidisciplinary approach is being considered. Final determinations in the plan of care are those of the provider(s). The responsibility for follow up of recommendations given during tumor board is that of the provider.   Today's extended care, comprehensive team conference, Phillip [name] was not present for the discussion and was not examined.   Multidisciplinary Tumor Board is a multidisciplinary case peer review process.  Decisions discussed in the Multidisciplinary Tumor Board reflect the opinions of the specialists present at the conference without having examined the patient.   Ultimately, treatment and diagnostic decisions rest with the primary provider(s) and the patient.

## 2022-01-13 ENCOUNTER — Encounter (HOSPITAL_COMMUNITY)
Admission: RE | Admit: 2022-01-13 | Discharge: 2022-01-13 | Disposition: A | Discharge status: Home / Self Care | Payer: PPO | Source: Ambulatory Visit | Attending: Radiation Oncology | Admitting: Radiation Oncology

## 2022-01-13 DIAGNOSIS — C060 Malignant neoplasm of cheek mucosa: Secondary | ICD-10-CM | POA: Diagnosis not present

## 2022-01-13 DIAGNOSIS — C44320 Squamous cell carcinoma of skin of unspecified parts of face: Secondary | ICD-10-CM | POA: Diagnosis not present

## 2022-01-13 LAB — GLUCOSE, CAPILLARY: Glucose-Capillary: 142 mg/dL — ABNORMAL HIGH (ref 70–99)

## 2022-01-13 MED ORDER — FLUDEOXYGLUCOSE F - 18 (FDG) INJECTION
8.4000 | Freq: Once | INTRAVENOUS | Status: AC | PRN
  Administered 2022-01-13: 8.8 via INTRAVENOUS

## 2022-01-18 ENCOUNTER — Inpatient Hospital Stay (HOSPITAL_BASED_OUTPATIENT_CLINIC_OR_DEPARTMENT_OTHER): Payer: PPO | Admitting: Oncology

## 2022-01-18 ENCOUNTER — Ambulatory Visit
Admission: RE | Admit: 2022-01-18 | Discharge: 2022-01-18 | Disposition: A | Discharge status: Home / Self Care | Payer: PPO | Source: Ambulatory Visit | Attending: Radiation Oncology | Admitting: Radiation Oncology

## 2022-01-18 ENCOUNTER — Encounter: Payer: Self-pay | Admitting: Oncology

## 2022-01-18 ENCOUNTER — Other Ambulatory Visit: Payer: Self-pay | Admitting: *Deleted

## 2022-01-18 VITALS — BP 116/66 | HR 76 | Temp 97.8°F | Resp 16 | Wt 173.6 lb

## 2022-01-18 DIAGNOSIS — Z7189 Other specified counseling: Secondary | ICD-10-CM | POA: Diagnosis not present

## 2022-01-18 DIAGNOSIS — C060 Malignant neoplasm of cheek mucosa: Secondary | ICD-10-CM

## 2022-01-18 DIAGNOSIS — Z510 Encounter for antineoplastic radiation therapy: Secondary | ICD-10-CM | POA: Diagnosis not present

## 2022-01-18 DIAGNOSIS — C4402 Squamous cell carcinoma of skin of lip: Secondary | ICD-10-CM | POA: Diagnosis not present

## 2022-01-18 DIAGNOSIS — Z79899 Other long term (current) drug therapy: Secondary | ICD-10-CM | POA: Insufficient documentation

## 2022-01-18 DIAGNOSIS — C069 Malignant neoplasm of mouth, unspecified: Secondary | ICD-10-CM

## 2022-01-18 MED ORDER — ONDANSETRON HCL 8 MG PO TABS: 8.0000 mg | ORAL_TABLET | Freq: Two times a day (BID) | ORAL | 1 refills | Status: DC | PRN

## 2022-01-18 MED ORDER — DEXAMETHASONE 4 MG PO TABS: 8.0000 mg | ORAL_TABLET | Freq: Every day | ORAL | 1 refills | Status: DC

## 2022-01-18 MED ORDER — PROCHLORPERAZINE MALEATE 10 MG PO TABS: 10.0000 mg | ORAL_TABLET | Freq: Four times a day (QID) | ORAL | 1 refills | Status: AC | PRN

## 2022-01-18 MED ORDER — LIDOCAINE-PRILOCAINE 2.5-2.5 % EX CREA: TOPICAL_CREAM | CUTANEOUS | 3 refills | Status: AC

## 2022-01-18 NOTE — Progress Notes (Signed)
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  He presented to ENT with symptoms of her right cheek mucosal irritation which did not get better after topical remedies.  This was ultimately biopsied by Dr. Tami Ribas and came back positive for squamous cell carcinoma while differentiated and keratinizing.  This has not been deemed to be resectable by ENT and therefore referred for consideration of radiation or chemotherapy.  PET CT scan did not show anyEvidence of locoregional or distant metastatic disease.  Patient received definitive radiation to this area.  A few weeks after radiation was completed patient developed a rapidly growing mass anterior to his original site of disease involvement.   He had a repeat biopsy by ENT which showed squamous cell carcinoma with basaloid features and minimal focal keratinization.  Repeat PET CT scan showed3.4 x 2.2 cm ill-defined soft tissue mass within the soft tissues lateral to the mandible on the right side.  Hypermetabolic activity in the right lateral submandibular space at the  level of the angle of mandible with an SUV of 4.5 suspicious for 1B nodal metastases.  Small hypermetabolic level 3 activity bilaterally with an SUV of 3.9 and 3.5 respectively concerning for possible disease involvement.  No evidence of distant metastatic disease.    Interval history-patient reports significant pain at the site of his recurrent tumor but is hesitant to try any opioid treatment.  He has not even taken his tramadol and only uses Tylenol as needed.  ECOG PS- 4 Pain scale- 5 Opioid associated constipation- no  Review of systems- Review of Systems  Constitutional:  Positive for malaise/fatigue. Negative for chills, fever and weight loss.  HENT:  Negative for congestion, ear discharge and nosebleeds.        Mouth pain  Eyes:  Negative for blurred vision.  Respiratory:  Negative for cough, hemoptysis, sputum production, shortness of breath and wheezing.   Cardiovascular:  Negative for chest pain, palpitations, orthopnea and claudication.  Gastrointestinal:  Negative for abdominal pain, blood in stool, constipation, diarrhea, heartburn, melena, nausea and vomiting.  Genitourinary:  Negative for dysuria, flank pain, frequency, hematuria and urgency.  Musculoskeletal:  Negative for back pain, joint pain and myalgias.  Skin:  Negative for rash.  Neurological:  Negative for dizziness, tingling, focal weakness, seizures, weakness and headaches.  Endo/Heme/Allergies:  Does not bruise/bleed easily.  Psychiatric/Behavioral:  Negative for depression and suicidal ideas. The patient does not have insomnia.      Allergies  Allergen Reactions   Ace Inhibitors Swelling and Other (See Comments)    Possible angioedema (upper lip swelling)   Morphine And Related Swelling    SWELLING REACTION UNSPECIFIED  [severity rated per PMH, 12/11/2017]     Past Medical History:  Diagnosis Date   Anxiety    Basal cell carcinoma 09/12/2018   Left forehead above lateral brow. Nodular pattern    Cancer ([place])    Coronary artery disease 2003   a. nonobstructive CAD by cath in 2003 and 2005; b.  Status post three-vessel CABG on 12/12/2017 with LIMA to LAD, sequential reverse SVG to OM1 and distal LCx   COVID-19 03/2020   Diverticulosis    Hearing loss    bilateral   Heart disease    Heart murmur    History of benign prostatic hypertrophy    History of radiation therapy 01/31/10-03/22/10   right tonsil/right neck node 7000 cGy 35 sessions, high risk lymph node volume 5940 cGy 35 sessions, low risk lymph node vol 5600 cGy 35 sessions   HPV in male    positive   HTN (hypertension)    Hyperlipidemia    diet controlled- taking medication d/t ensure drinking   Hypothyroid 01/16/2013   Ischemic cardiomyopathy    a. 10/2017: echo showing reduced EF of 40-45%, HK of the anterior, anteroseptal and apical myocardium with Grade 1 DD.    Neck pain    Persistent atrial fibrillation (HCC)    Primary squamous cell carcinoma of skin of lip 2023   lip and tonuge   Squamous cell carcinoma    right tonsil- 2011   Squamous cell carcinoma of skin 09/02/2019   Crown scalp. WD SCC   Squamous cell carcinoma of skin 09/02/2019   Left distal lateral deltoid. SCCis arising in SK   Thrombocytopenia ([place]) 02/02/2012   unclear etiology   Tinnitus    Tubular adenoma of colon      Past Surgical History:  Procedure Laterality Date   artoscopic knee     CARDIAC CATHETERIZATION  2003, 2005   40% blockage, two 30% blockages treated medically Ron Parker)   CARDIOVERSION N/A 02/12/2018   Procedure: CARDIOVERSION;  Surgeon: [name], MD;  Location: [facility];  Service: Cardiovascular;  Laterality: N/A;   CARDIOVERSION N/A 05/10/2021   Procedure: CARDIOVERSION;  Surgeon: [name], MD;  Location: [facility];  Service: Cardiovascular;  Laterality: N/A;   COLONOSCOPY  03/2015   TA x3, HP, melanosis coli, severe diverticulosis, rpt 3 yrs (Pyrtle)   COLONOSCOPY  01/2019   fair prep, diverticulosis, f/u  PRN (Pyrtle)   CORONARY ARTERY BYPASS GRAFT N/A 12/12/2017   Procedure: CORONARY ARTERY BYPASS GRAFTING (CABG) x3 using the right greater saphenous vein harvested endoscopically and the left internal mammary artery. LIMA to LAD, SEQ SVG to OM1 & OM2;  Surgeon: [name], MD;  Location: [place];  Service: Open Heart Surgery;  Laterality: N/A;   DENTAL SURGERY     extractions   IR THORACENTESIS ASP PLEURAL SPACE W/IMG GUIDE  01/17/2018   KNEE ARTHROSCOPY Left    LEFT HEART CATH AND CORONARY ANGIOGRAPHY N/A 12/10/2017   Procedure: LEFT HEART CATH AND CORONARY ANGIOGRAPHY;  Surgeon: [name], MD;  Location: [place] [facility];  Service: Cardiovascular;  Laterality: N/A;   PEG PLACEMENT  02/2010   TEE WITHOUT CARDIOVERSION N/A 12/12/2017   Procedure: TRANSESOPHAGEAL ECHOCARDIOGRAM (TEE);  Surgeon: [name], MD;  Location: [place];  Service: Open Heart Surgery;  Laterality: N/A;   TONSILLECTOMY     triple bypass  11/2017   VASECTOMY      Social History   Socioeconomic History   Marital status: Married    Spouse name: Fraser Din   Number of children: 1   Years of  education: Not on file   Highest education level: Not on file  Occupational History   Not on file  Tobacco Use   Smoking status: Never   Smokeless tobacco: Never  Vaping Use   Vaping Use: Never used  Substance and Sexual Activity   Alcohol use: Yes    Comment: glass of wine 1-2 times a month   Drug use: No   Sexual activity: Yes  Other Topics Concern   Not on file  Social History Narrative   Married 8 years- divorced; remarried 1994 Optometrist)   Forensic psychologist    Work; Film/video editor; 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    Plays golf, remains very active with an interest in politics. Jan 2011 moved to area.    Involved in [employer].    He is a runner - has run [employer] and [employer]. S/p torn meniscus.    Social Determinants of Health    Financial Resource Strain: Not on file  Food Insecurity: Not on file  Transportation Needs: Not on file  Physical Activity: Not on file  Stress: Not on file  Social Connections: Not on file  Intimate Partner Violence: Not on file    Family History  Problem Relation Age of Onset   Coronary artery disease Father    Heart attack Father 75   Heart disease Father    Stroke Mother    Colon cancer Neg Hx    Esophageal cancer Neg Hx    Rectal cancer Neg Hx    Stomach cancer Neg Hx    Kidney cancer Neg Hx    Kidney failure Neg Hx    Prostate cancer Neg Hx    Tuberculosis Neg Hx      Current Outpatient Medications:    amLODipine (NORVASC) 10 MG tablet, Take 1 tablet by mouth once daily, Disp: 90 tablet, Rfl: 0   apixaban (ELIQUIS) 5 MG TABS tablet, Take 1 tablet by mouth twice daily, Disp: 60 tablet, Rfl: 5   Cholecalciferol (VITAMIN D3) 25 MCG (1000 UT) CAPS, Take 1 capsule by mouth daily., Disp: , Rfl:    docusate sodium (COLACE) 250 MG capsule, Takes total '600mg'$  /day, Disp: , Rfl:    FLUoxetine (PROZAC) 20 MG capsule, Take 1 capsule (20 mg total) by mouth daily., Disp: 30 capsule, Rfl: 6   furosemide (LASIX) 20 MG tablet, Take 1 tablet by mouth once daily, Disp: 30 tablet, Rfl: 2   HURRICAINE 20 % AERO, as needed., Disp: , Rfl:    levothyroxine (EUTHYROX) 50 MCG tablet, Take 1 tablet (50 mcg total) by mouth daily before breakfast., Disp: 30 tablet, Rfl: 8   MAGNESIUM PO, Take 1 tablet by mouth daily., Disp: , Rfl:    Multiple Vitamins-Minerals (ZINC PO), Take by mouth daily., Disp: , Rfl:    Omega-3 Fatty Acids (OMEGA 3 PO), Take by mouth daily., Disp: , Rfl:    polyethylene glycol (MIRALAX / GLYCOLAX) 17 g packet, Take 17 g by mouth daily as needed., Disp: , Rfl:    Potassium 99 MG TABS, Take 1 tablet by mouth daily., Disp: , Rfl:    rosuvastatin (CRESTOR) 20 MG tablet, Take 1 tablet by mouth once daily, Disp: 90 tablet, Rfl: 0   VITAMIN A PO, Take 2,400 mcg by mouth daily. ,  Disp: , Rfl:    vitamin E 400 UNIT capsule, Take 400 Units by mouth daily., Disp: , Rfl:    Zinc 50 MG  CAPS, Take 50 mg by mouth daily., Disp: , Rfl:    fluocinonide gel (LIDEX) 0.05 %, SMARTSIG:Sparingly Topical 3 Times Daily, Disp: , Rfl:    nitroGLYCERIN (NITROSTAT) 0.4 MG SL tablet, Place 1 tablet (0.4 mg total) under the tongue every 5 (five) minutes as needed for chest pain (Maximum 3 doses.). (Patient not taking: Reported on 01/18/2022), Disp: 25 tablet, Rfl: 2   ondansetron (ZOFRAN-ODT) 4 MG disintegrating tablet, Take 1 tablet (4 mg total) by mouth every 8 (eight) hours as needed for nausea or vomiting. (Patient not taking: Reported on 01/18/2022), Disp: 20 tablet, Rfl: 0   predniSONE (DELTASONE) 10 MG tablet, Take by mouth. (Patient not taking: Reported on 01/18/2022), Disp: , Rfl:    traMADol (ULTRAM) 50 MG tablet, TAKE 1 TABLET BY MOUTH EVERY 6 HOURS AS NEEDED (Patient not taking: Reported on 01/18/2022), Disp: 30 tablet, Rfl: 0   zaleplon (SONATA) 10 MG capsule, Take 1 capsule (10 mg total) by mouth at bedtime as needed for sleep. (Patient not taking: Reported on 01/11/2022), Disp: 30 capsule, Rfl: 0  Physical exam:  Vitals:   01/18/22 1136  BP: 116/66  Pulse: 76  Resp: 16  Temp: 97.8 F (36.6 C)  SpO2: 97%  Weight: 173 lb 9.6 oz (78.7 kg)   Physical Exam Cardiovascular:     Rate and Rhythm: Normal rate and regular rhythm.     Heart sounds: Normal heart sounds.  Pulmonary:     Effort: Pulmonary effort is normal.     Breath sounds: Normal breath sounds.  Abdominal:     General: Bowel sounds are normal.     Palpations: Abdomen is soft.  Skin:    General: Skin is warm and dry.  Neurological:     Mental Status: He is alert and oriented to person, place, and time.        Latest Ref Rng & Units 05/13/2021    9:55 AM  CMP  Glucose 65 - 99 mg/dL 117    BUN 8 - 27 mg/dL 15    Creatinine 0.76 - 1.27 mg/dL 0.77    Sodium 134 - 144 mmol/L 142    Potassium 3.5 - 5.2 mmol/L 4.4     Chloride 96 - 106 mmol/L 103    CO2 20 - 29 mmol/L 24    Calcium 8.6 - 10.2 mg/dL 9.5        Latest Ref Rng & Units 12/14/2021    1:56 PM  CBC  WBC 4.0 - 10.5 K/uL 8.2    Hemoglobin 13.0 - 17.0 g/dL 15.0    Hematocrit 39.0 - 52.0 % 44.0    Platelets 150 - 400 K/uL 156      No images are attached to the encounter.  NM PET Image Initial (PI) Skull Base To Thigh  Result Date: 01/15/2022 CLINICAL DATA:  Initial treatment strategy for squamous cell carcinoma of the buccal mucosa. EXAM: NUCLEAR MEDICINE PET SKULL BASE TO THIGH TECHNIQUE: 8.8 mCi F-18 FDG was injected intravenously. Full-ring PET imaging was performed from the skull base to thigh after the radiotracer. CT data was obtained and used for attenuation correction and anatomic localization. Fasting blood glucose: 144 mg/dl COMPARISON:  PET-CT 10/25/2021. CT neck 01/30/2012 and abdominopelvic CT 03/13/2010. FINDINGS: Mediastinal blood pool activity: SUV max 2.4 NECK: Intense focal hypermetabolic activity within the soft tissues lateral to the mandible on the right (SUV max 9.7), corresponding with an ill-defined soft tissue mass measuring approximately 3.4 x 2.2 cm on image 38/4. There  is asymmetric hypermetabolic activity in the right lateral submandibular space at the level of the angle of the mandible (SUV max 4.5), suspicious for a 1B nodal metastasis. No enlarged cervical lymph nodes are identified. However, there are small hypermetabolic level III bilaterally (SUV max 3.9 on the right and 3.5 on the left).No other definite lesions of the pharyngeal mucosal space. Activity within the lymphoid tissue of Waldeyer's ring is within physiologic limits. Probable left mandibular dental caries. Incidental CT findings: Bilateral carotid atherosclerosis. As above, probable left mandibular dental caries. CHEST: There are no hypermetabolic mediastinal, hilar or axillary lymph nodes. No hypermetabolic pulmonary activity or suspicious nodularity.  Incidental CT findings: Status post median sternotomy and CABG. There is diffuse atherosclerosis of the aorta, great vessels and coronary arteries. Aortic valvular calcifications are present. There is a small hiatal hernia. The lungs are clear. ABDOMEN/PELVIS: There is no hypermetabolic activity within the liver, adrenal glands, spleen or pancreas. There is no hypermetabolic nodal activity. Incidental CT findings: Similar low-density renal lesions bilaterally without hypermetabolic activity, consistent with incidental cysts; no follow-up imaging recommended. Aortic atherosclerosis. Distal colonic diverticulosis. SKELETON: There is no hypermetabolic activity to suggest osseous metastatic disease. No gross mandibular osseous destruction. Facial assessment is mildly limited by beam hardening artifact from the patient's dental amalgam. Incidental CT findings: none IMPRESSION: 1. Intensely hypermetabolic right mandibular buccal soft tissue mass consistent with known squamous cell carcinoma. Suspected small nodal metastases involving level 1B on the right and level III bilaterally. 2. No evidence of distant metastatic disease. 3. Carotid, coronary and Aortic Atherosclerosis (ICD10-I70.0). Electronically Signed   By: Richardean Sale M.D.   On: 01/15/2022 11:16     Assessment and plan- Patient is a 81 y.o. male with recurrent squamous cell carcinoma stage III T3 N1 M0 here to discuss further management  Patient originally had a T1 tumor back in March 2023 which was treated with definitive radiation.  Shortly following that now he has an area anterior to the treated area which is a little over 3 cm.  PET scan shows concerning findings of lymph node 1 week involvement.  There is mild bilateral hypermetabolic activity in the level 3 lymph nodes which is questionable for disease involvement.  I discussed patient's case with Dr. Baruch Gouty from radiation oncology over the phone today.  Given that patient has a rapidly  increasing mass I would favor starting with radiation treatment ASAP.  I do not think that he would benefit from neoadjuvant immunotherapy in the absence of definitive surgery plans.  As such patient has not been deemed to be a surgical candidate given his age frailty and underlying dementia.  I would favor definitive concurrent chemoradiation at this time.  If he has residual disease post chemoradiation perhaps we could consider putting him on immunotherapy at that time.  Patient has tolerated high-dose cisplatin for early 10 years ago.  He had significant hearing loss as well as nausea vomiting.  He required platelet transfusions and hospitalization.  I will therefore proceed with carboplatin AUC 2 along with Taxol 50 mg per metered squared given weekly concurrent with radiation which he will likely get for 5 weeks.  Discussed risk of peripheral neuropathy and infusion reaction associated with both carboplatin and Taxol.  Discussed risks and benefits of chemotherapy including all but not limited to nausea, vomiting, low blood counts, risk of infections and hospitalization.  Treatment will be given with a potential curative intent.  We will proceed with port placement at this time and tentatively  patient will start cycle 1 of CarboTaxol chemotherapy when he starts radiation treatment.  Neoplasm related pain: Patient is hesitant to try any oxycodone at this time.  He is willing to go back to his tramadol which I had prescribed to him in the past   Visit Diagnosis 1. Squamous cell carcinoma of oral cavity (HCC)   2. Goals of care, counseling/discussion      Dr. Randa Evens, MD, MPH [facility] [phone] 01/18/2022 3:43 PM

## 2022-01-18 NOTE — Progress Notes (Signed)
START OFF PATHWAY REGIMEN - Head and Neck   [id number]:Carboplatin + Paclitaxel (2/50) + RT weekly x 6 weeks:   Administer weekly during RT:     Paclitaxel      Carboplatin   **Always confirm dose/schedule in your pharmacy ordering system**  Patient Characteristics: Oral Cavity, Preoperative or Nonsurgical Candidate, Stage III - IVB Disease Classification: Oral Cavity AJCC T Category: T3 AJCC M Category: M0 AJCC N Category: cN1 AJCC 8 Stage Grouping: III Therapeutic Status: Preoperative or Nonsurgical Candidate Intent of Therapy: Curative Intent, Discussed with Patient

## 2022-01-18 NOTE — H&P (View-Only) (Signed)
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  He presented to ENT with symptoms of her right cheek mucosal irritation which did not get better after topical remedies.  This was ultimately biopsied by Dr. Tami Ribas and came back positive for squamous cell carcinoma while differentiated and keratinizing.  This has not been deemed to be resectable by ENT and therefore referred for consideration of radiation or chemotherapy.  PET CT scan did not show anyEvidence of locoregional or distant metastatic disease.  Patient received definitive radiation to this area.  A few weeks after radiation was completed patient developed a rapidly growing mass anterior to his original site of disease involvement.   He had a repeat biopsy by ENT which showed squamous cell carcinoma with basaloid features and minimal focal keratinization.  Repeat PET CT scan showed3.4 x 2.2 cm ill-defined soft tissue mass within the soft tissues lateral to the mandible on the right side.  Hypermetabolic activity in the right lateral submandibular space at the  level of the angle of mandible with an SUV of 4.5 suspicious for 1B nodal metastases.  Small hypermetabolic level 3 activity bilaterally with an SUV of 3.9 and 3.5 respectively concerning for possible disease involvement.  No evidence of distant metastatic disease.    Interval history-patient reports significant pain at the site of his recurrent tumor but is hesitant to try any opioid treatment.  He has not even taken his tramadol and only uses Tylenol as needed.  ECOG PS- 4 Pain scale- 5 Opioid associated constipation- no  Review of systems- Review of Systems  Constitutional:  Positive for malaise/fatigue. Negative for chills, fever and weight loss.  HENT:  Negative for congestion, ear discharge and nosebleeds.        Mouth pain  Eyes:  Negative for blurred vision.  Respiratory:  Negative for cough, hemoptysis, sputum production, shortness of breath and wheezing.   Cardiovascular:  Negative for chest pain, palpitations, orthopnea and claudication.  Gastrointestinal:  Negative for abdominal pain, blood in stool, constipation, diarrhea, heartburn, melena, nausea and vomiting.  Genitourinary:  Negative for dysuria, flank pain, frequency, hematuria and urgency.  Musculoskeletal:  Negative for back pain, joint pain and myalgias.  Skin:  Negative for rash.  Neurological:  Negative for dizziness, tingling, focal weakness, seizures, weakness and headaches.  Endo/Heme/Allergies:  Does not bruise/bleed easily.  Psychiatric/Behavioral:  Negative for depression and suicidal ideas. The patient does not have insomnia.      Allergies  Allergen Reactions   Ace Inhibitors Swelling and Other (See Comments)    Possible angioedema (upper lip swelling)   Morphine And Related Swelling    SWELLING REACTION UNSPECIFIED  [severity rated per PMH, 12/11/2017]     Past Medical History:  Diagnosis Date   Anxiety    Basal cell carcinoma 09/12/2018   Left forehead above lateral brow. Nodular pattern    Cancer ([place])    Coronary artery disease 2003   a. nonobstructive CAD by cath in 2003 and 2005; b.  Status post three-vessel CABG on 12/12/2017 with LIMA to LAD, sequential reverse SVG to OM1 and distal LCx   COVID-19 03/2020   Diverticulosis    Hearing loss    bilateral   Heart disease    Heart murmur    History of benign prostatic hypertrophy    History of radiation therapy 01/31/10-03/22/10   right tonsil/right neck node 7000 cGy 35 sessions, high risk lymph node volume 5940 cGy 35 sessions, low risk lymph node vol 5600 cGy 35 sessions   HPV in male    positive   HTN (hypertension)    Hyperlipidemia    diet controlled- taking medication d/t ensure drinking   Hypothyroid 01/16/2013   Ischemic cardiomyopathy    a. 10/2017: echo showing reduced EF of 40-45%, HK of the anterior, anteroseptal and apical myocardium with Grade 1 DD.    Neck pain    Persistent atrial fibrillation (HCC)    Primary squamous cell carcinoma of skin of lip 2023   lip and tonuge   Squamous cell carcinoma    right tonsil- 2011   Squamous cell carcinoma of skin 09/02/2019   Crown scalp. WD SCC   Squamous cell carcinoma of skin 09/02/2019   Left distal lateral deltoid. SCCis arising in SK   Thrombocytopenia ([place]) 02/02/2012   unclear etiology   Tinnitus    Tubular adenoma of colon      Past Surgical History:  Procedure Laterality Date   artoscopic knee     CARDIAC CATHETERIZATION  2003, 2005   40% blockage, two 30% blockages treated medically Ron Parker)   CARDIOVERSION N/A 02/12/2018   Procedure: CARDIOVERSION;  Surgeon: [name], MD;  Location: [facility];  Service: Cardiovascular;  Laterality: N/A;   CARDIOVERSION N/A 05/10/2021   Procedure: CARDIOVERSION;  Surgeon: [name], MD;  Location: [facility];  Service: Cardiovascular;  Laterality: N/A;   COLONOSCOPY  03/2015   TA x3, HP, melanosis coli, severe diverticulosis, rpt 3 yrs (Pyrtle)   COLONOSCOPY  01/2019   fair prep, diverticulosis, f/u  PRN (Pyrtle)   CORONARY ARTERY BYPASS GRAFT N/A 12/12/2017   Procedure: CORONARY ARTERY BYPASS GRAFTING (CABG) x3 using the right greater saphenous vein harvested endoscopically and the left internal mammary artery. LIMA to LAD, SEQ SVG to OM1 & OM2;  Surgeon: [name], MD;  Location: [place];  Service: Open Heart Surgery;  Laterality: N/A;   DENTAL SURGERY     extractions   IR THORACENTESIS ASP PLEURAL SPACE W/IMG GUIDE  01/17/2018   KNEE ARTHROSCOPY Left    LEFT HEART CATH AND CORONARY ANGIOGRAPHY N/A 12/10/2017   Procedure: LEFT HEART CATH AND CORONARY ANGIOGRAPHY;  Surgeon: [name], MD;  Location: [place] [facility];  Service: Cardiovascular;  Laterality: N/A;   PEG PLACEMENT  02/2010   TEE WITHOUT CARDIOVERSION N/A 12/12/2017   Procedure: TRANSESOPHAGEAL ECHOCARDIOGRAM (TEE);  Surgeon: [name], MD;  Location: [facility];  Service: Open Heart Surgery;  Laterality: N/A;   TONSILLECTOMY     triple bypass  11/2017   VASECTOMY      Social History   Socioeconomic History   Marital status: Married    Spouse name: Fraser Din   Number of children: 1   Years of  education: Not on file   Highest education level: Not on file  Occupational History   Not on file  Tobacco Use   Smoking status: Never   Smokeless tobacco: Never  Vaping Use   Vaping Use: Never used  Substance and Sexual Activity   Alcohol use: Yes    Comment: glass of wine 1-2 times a month   Drug use: No   Sexual activity: Yes  Other Topics Concern   Not on file  Social History Narrative   Married 8 years- divorced; remarried 1994 Optometrist)   Forensic psychologist    Work; Film/video editor; 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    Plays golf, remains very active with an interest in politics. Jan 2011 moved to area.    Involved in [employer].    He is a runner - has run [employer] and [employer]. S/p torn meniscus.    Social Determinants of Health    Financial Resource Strain: Not on file  Food Insecurity: Not on file  Transportation Needs: Not on file  Physical Activity: Not on file  Stress: Not on file  Social Connections: Not on file  Intimate Partner Violence: Not on file    Family History  Problem Relation Age of Onset   Coronary artery disease Father    Heart attack Father 7   Heart disease Father    Stroke Mother    Colon cancer Neg Hx    Esophageal cancer Neg Hx    Rectal cancer Neg Hx    Stomach cancer Neg Hx    Kidney cancer Neg Hx    Kidney failure Neg Hx    Prostate cancer Neg Hx    Tuberculosis Neg Hx      Current Outpatient Medications:    amLODipine (NORVASC) 10 MG tablet, Take 1 tablet by mouth once daily, Disp: 90 tablet, Rfl: 0   apixaban (ELIQUIS) 5 MG TABS tablet, Take 1 tablet by mouth twice daily, Disp: 60 tablet, Rfl: 5   Cholecalciferol (VITAMIN D3) 25 MCG (1000 UT) CAPS, Take 1 capsule by mouth daily., Disp: , Rfl:    docusate sodium (COLACE) 250 MG capsule, Takes total '600mg'$  /day, Disp: , Rfl:    FLUoxetine (PROZAC) 20 MG capsule, Take 1 capsule (20 mg total) by mouth daily., Disp: 30 capsule, Rfl: 6   furosemide (LASIX) 20 MG tablet, Take 1 tablet by mouth once daily, Disp: 30 tablet, Rfl: 2   HURRICAINE 20 % AERO, as needed., Disp: , Rfl:    levothyroxine (EUTHYROX) 50 MCG tablet, Take 1 tablet (50 mcg total) by mouth daily before breakfast., Disp: 30 tablet, Rfl: 8   MAGNESIUM PO, Take 1 tablet by mouth daily., Disp: , Rfl:    Multiple Vitamins-Minerals (ZINC PO), Take by mouth daily., Disp: , Rfl:    Omega-3 Fatty Acids (OMEGA 3 PO), Take by mouth daily., Disp: , Rfl:    polyethylene glycol (MIRALAX / GLYCOLAX) 17 g packet, Take 17 g by mouth daily as needed., Disp: , Rfl:    Potassium 99 MG TABS, Take 1 tablet by mouth daily., Disp: , Rfl:    rosuvastatin (CRESTOR) 20 MG tablet, Take 1 tablet by mouth once daily, Disp: 90 tablet, Rfl: 0   VITAMIN A PO, Take 2,400 mcg by mouth daily. ,  Disp: , Rfl:    vitamin E 400 UNIT capsule, Take 400 Units by mouth daily., Disp: , Rfl:    Zinc 50 MG  CAPS, Take 50 mg by mouth daily., Disp: , Rfl:    fluocinonide gel (LIDEX) 0.05 %, SMARTSIG:Sparingly Topical 3 Times Daily, Disp: , Rfl:    nitroGLYCERIN (NITROSTAT) 0.4 MG SL tablet, Place 1 tablet (0.4 mg total) under the tongue every 5 (five) minutes as needed for chest pain (Maximum 3 doses.). (Patient not taking: Reported on 01/18/2022), Disp: 25 tablet, Rfl: 2   ondansetron (ZOFRAN-ODT) 4 MG disintegrating tablet, Take 1 tablet (4 mg total) by mouth every 8 (eight) hours as needed for nausea or vomiting. (Patient not taking: Reported on 01/18/2022), Disp: 20 tablet, Rfl: 0   predniSONE (DELTASONE) 10 MG tablet, Take by mouth. (Patient not taking: Reported on 01/18/2022), Disp: , Rfl:    traMADol (ULTRAM) 50 MG tablet, TAKE 1 TABLET BY MOUTH EVERY 6 HOURS AS NEEDED (Patient not taking: Reported on 01/18/2022), Disp: 30 tablet, Rfl: 0   zaleplon (SONATA) 10 MG capsule, Take 1 capsule (10 mg total) by mouth at bedtime as needed for sleep. (Patient not taking: Reported on 01/11/2022), Disp: 30 capsule, Rfl: 0  Physical exam:  Vitals:   01/18/22 1136  BP: 116/66  Pulse: 76  Resp: 16  Temp: 97.8 F (36.6 C)  SpO2: 97%  Weight: 173 lb 9.6 oz (78.7 kg)   Physical Exam Cardiovascular:     Rate and Rhythm: Normal rate and regular rhythm.     Heart sounds: Normal heart sounds.  Pulmonary:     Effort: Pulmonary effort is normal.     Breath sounds: Normal breath sounds.  Abdominal:     General: Bowel sounds are normal.     Palpations: Abdomen is soft.  Skin:    General: Skin is warm and dry.  Neurological:     Mental Status: He is alert and oriented to person, place, and time.        Latest Ref Rng & Units 05/13/2021    9:55 AM  CMP  Glucose 65 - 99 mg/dL 117    BUN 8 - 27 mg/dL 15    Creatinine 0.76 - 1.27 mg/dL 0.77    Sodium 134 - 144 mmol/L 142    Potassium 3.5 - 5.2 mmol/L 4.4     Chloride 96 - 106 mmol/L 103    CO2 20 - 29 mmol/L 24    Calcium 8.6 - 10.2 mg/dL 9.5        Latest Ref Rng & Units 12/14/2021    1:56 PM  CBC  WBC 4.0 - 10.5 K/uL 8.2    Hemoglobin 13.0 - 17.0 g/dL 15.0    Hematocrit 39.0 - 52.0 % 44.0    Platelets 150 - 400 K/uL 156      No images are attached to the encounter.  NM PET Image Initial (PI) Skull Base To Thigh  Result Date: 01/15/2022 CLINICAL DATA:  Initial treatment strategy for squamous cell carcinoma of the buccal mucosa. EXAM: NUCLEAR MEDICINE PET SKULL BASE TO THIGH TECHNIQUE: 8.8 mCi F-18 FDG was injected intravenously. Full-ring PET imaging was performed from the skull base to thigh after the radiotracer. CT data was obtained and used for attenuation correction and anatomic localization. Fasting blood glucose: 144 mg/dl COMPARISON:  PET-CT 10/25/2021. CT neck 01/30/2012 and abdominopelvic CT 03/13/2010. FINDINGS: Mediastinal blood pool activity: SUV max 2.4 NECK: Intense focal hypermetabolic activity within the soft tissues lateral to the mandible on the right (SUV max 9.7), corresponding with an ill-defined soft tissue mass measuring approximately 3.4 x 2.2 cm on image 38/4. There  is asymmetric hypermetabolic activity in the right lateral submandibular space at the level of the angle of the mandible (SUV max 4.5), suspicious for a 1B nodal metastasis. No enlarged cervical lymph nodes are identified. However, there are small hypermetabolic level III bilaterally (SUV max 3.9 on the right and 3.5 on the left).No other definite lesions of the pharyngeal mucosal space. Activity within the lymphoid tissue of Waldeyer's ring is within physiologic limits. Probable left mandibular dental caries. Incidental CT findings: Bilateral carotid atherosclerosis. As above, probable left mandibular dental caries. CHEST: There are no hypermetabolic mediastinal, hilar or axillary lymph nodes. No hypermetabolic pulmonary activity or suspicious nodularity.  Incidental CT findings: Status post median sternotomy and CABG. There is diffuse atherosclerosis of the aorta, great vessels and coronary arteries. Aortic valvular calcifications are present. There is a small hiatal hernia. The lungs are clear. ABDOMEN/PELVIS: There is no hypermetabolic activity within the liver, adrenal glands, spleen or pancreas. There is no hypermetabolic nodal activity. Incidental CT findings: Similar low-density renal lesions bilaterally without hypermetabolic activity, consistent with incidental cysts; no follow-up imaging recommended. Aortic atherosclerosis. Distal colonic diverticulosis. SKELETON: There is no hypermetabolic activity to suggest osseous metastatic disease. No gross mandibular osseous destruction. Facial assessment is mildly limited by beam hardening artifact from the patient's dental amalgam. Incidental CT findings: none IMPRESSION: 1. Intensely hypermetabolic right mandibular buccal soft tissue mass consistent with known squamous cell carcinoma. Suspected small nodal metastases involving level 1B on the right and level III bilaterally. 2. No evidence of distant metastatic disease. 3. Carotid, coronary and Aortic Atherosclerosis (ICD10-I70.0). Electronically Signed   By: Richardean Sale M.D.   On: 01/15/2022 11:16     Assessment and plan- Patient is a 81 y.o. male with recurrent squamous cell carcinoma stage III T3 N1 M0 here to discuss further management  Patient originally had a T1 tumor back in March 2023 which was treated with definitive radiation.  Shortly following that now he has an area anterior to the treated area which is a little over 3 cm.  PET scan shows concerning findings of lymph node 1 week involvement.  There is mild bilateral hypermetabolic activity in the level 3 lymph nodes which is questionable for disease involvement.  I discussed patient's case with Dr. Baruch Gouty from radiation oncology over the phone today.  Given that patient has a rapidly  increasing mass I would favor starting with radiation treatment ASAP.  I do not think that he would benefit from neoadjuvant immunotherapy in the absence of definitive surgery plans.  As such patient has not been deemed to be a surgical candidate given his age frailty and underlying dementia.  I would favor definitive concurrent chemoradiation at this time.  If he has residual disease post chemoradiation perhaps we could consider putting him on immunotherapy at that time.  Patient has tolerated high-dose cisplatin for early 10 years ago.  He had significant hearing loss as well as nausea vomiting.  He required platelet transfusions and hospitalization.  I will therefore proceed with carboplatin AUC 2 along with Taxol 50 mg per metered squared given weekly concurrent with radiation which he will likely get for 5 weeks.  Discussed risk of peripheral neuropathy and infusion reaction associated with both carboplatin and Taxol.  Discussed risks and benefits of chemotherapy including all but not limited to nausea, vomiting, low blood counts, risk of infections and hospitalization.  Treatment will be given with a potential curative intent.  We will proceed with port placement at this time and tentatively  patient will start cycle 1 of CarboTaxol chemotherapy when he starts radiation treatment.  Neoplasm related pain: Patient is hesitant to try any oxycodone at this time.  He is willing to go back to his tramadol which I had prescribed to him in the past   Visit Diagnosis 1. Squamous cell carcinoma of oral cavity (HCC)   2. Goals of care, counseling/discussion      Dr. Randa Evens, MD, MPH [facility] [phone] 01/18/2022 3:43 PM

## 2022-01-19 ENCOUNTER — Telehealth: Payer: Self-pay | Admitting: Internal Medicine

## 2022-01-19 ENCOUNTER — Ambulatory Visit: Payer: PPO | Admitting: Radiation Oncology

## 2022-01-19 ENCOUNTER — Encounter: Payer: Self-pay | Admitting: Internal Medicine

## 2022-01-19 ENCOUNTER — Telehealth: Payer: Self-pay

## 2022-01-19 ENCOUNTER — Other Ambulatory Visit: Payer: Self-pay | Admitting: *Deleted

## 2022-01-19 ENCOUNTER — Encounter: Payer: Self-pay | Admitting: *Deleted

## 2022-01-19 DIAGNOSIS — C060 Malignant neoplasm of cheek mucosa: Secondary | ICD-10-CM

## 2022-01-19 NOTE — Telephone Encounter (Signed)
Clinical pharmacist to review Eliquis 

## 2022-01-19 NOTE — Telephone Encounter (Signed)
   Pre-operative Risk Assessment    Patient Name: Phillip [name]  DOB: 1941-06-21 MRN: [id number]      Request for Surgical Clearance    Procedure:   cisplatin with radiation needs portacath insertion with IR   Date of Surgery:  Clearance NEXT WEEK                                  Surgeon:  RAO Surgeon's Group or Practice Name:  [facility]  Phone number:  [phone] Fax number:  [phone]   Type of Clearance Requested:   - Medical  - Pharmacy:  Hold Apixaban (Eliquis) 2 days    Type of Anesthesia:  Not Indicated   Additional requests/questions:    Jonathon Jordan   01/19/2022, 2:24 PM

## 2022-01-19 NOTE — Telephone Encounter (Signed)
Sent PA for Zofran via Covermymeds (KEY: [id number]). waiting on approval. PA for lidocaine was sent (KEY: [id number]) and was approved.

## 2022-01-20 ENCOUNTER — Other Ambulatory Visit: Payer: Self-pay | Admitting: *Deleted

## 2022-01-20 ENCOUNTER — Inpatient Hospital Stay: Payer: PPO | Attending: Oncology

## 2022-01-20 DIAGNOSIS — G893 Neoplasm related pain (acute) (chronic): Secondary | ICD-10-CM | POA: Diagnosis not present

## 2022-01-20 DIAGNOSIS — C060 Malignant neoplasm of cheek mucosa: Secondary | ICD-10-CM | POA: Insufficient documentation

## 2022-01-20 DIAGNOSIS — Z79899 Other long term (current) drug therapy: Secondary | ICD-10-CM | POA: Diagnosis not present

## 2022-01-20 DIAGNOSIS — C4402 Squamous cell carcinoma of skin of lip: Secondary | ICD-10-CM | POA: Diagnosis not present

## 2022-01-20 DIAGNOSIS — Z7901 Long term (current) use of anticoagulants: Secondary | ICD-10-CM | POA: Insufficient documentation

## 2022-01-20 DIAGNOSIS — Z510 Encounter for antineoplastic radiation therapy: Secondary | ICD-10-CM | POA: Insufficient documentation

## 2022-01-20 DIAGNOSIS — I4891 Unspecified atrial fibrillation: Secondary | ICD-10-CM | POA: Insufficient documentation

## 2022-01-20 DIAGNOSIS — R030 Elevated blood-pressure reading, without diagnosis of hypertension: Secondary | ICD-10-CM | POA: Insufficient documentation

## 2022-01-20 DIAGNOSIS — Z5111 Encounter for antineoplastic chemotherapy: Secondary | ICD-10-CM | POA: Insufficient documentation

## 2022-01-20 MED ORDER — TRAMADOL HCL 50 MG PO TABS
50.0000 mg | ORAL_TABLET | Freq: Four times a day (QID) | ORAL | 0 refills | Status: AC | PRN
Start: 2022-01-20 — End: ?

## 2022-01-20 NOTE — Telephone Encounter (Signed)
Attempted to call the patient twice without success.  I tried to call the mobile number which went into voicemail directly.  I left a message on his home phone to hold blood thinner for 2 days and call us back to speak to the preop APP for clearance.

## 2022-01-20 NOTE — Telephone Encounter (Signed)
   Name: Phillip [name]  DOB: 02-12-1941  MRN: [id number]   Primary Cardiologist: [name], MD  Chart reviewed as part of pre-operative protocol coverage. Patient was contacted 01/20/2022 in reference to pre-operative risk assessment for pending surgery as outlined below.  Phillip [name] was last seen on 09/02/2021 by Dr. Saunders Revel.  Since that day, Phillip [name] has done well without exertional chest pain or worsening dyspnea.  He may hold Eliquis for 2 days prior to the procedure and restart as soon as possible afterward at the surgeon's discretion.  I have discussed this with the patient and his wife.  Therefore, based on ACC/AHA guidelines, the patient would be at acceptable risk for the planned procedure without further cardiovascular testing.   The patient was advised that if he develops new symptoms prior to surgery to contact our office to arrange for a follow-up visit, and he verbalized understanding.  I will route this recommendation to the requesting party via Epic fax function and remove from pre-op pool. Please call with questions.  [place], [place] 01/20/2022, 4:48 PM

## 2022-01-20 NOTE — Telephone Encounter (Signed)
Patient with diagnosis of afib on Eliquis for anticoagulation.    Procedure: portacath insertion with IR  Date of procedure: 01/23/22   CHA2DS2-VASc Score = 5   This indicates a 7.2% annual risk of stroke. [name] score is based upon: CHF History: 1 HTN History: 1 Diabetes History: 0 Stroke History: 0 Vascular Disease History: 1 Age Score: 2 Gender Score: 0      CrCl 82 ml/min  Per office protocol, patient can hold Eliquis for 2 day prior to procedure.

## 2022-01-23 ENCOUNTER — Ambulatory Visit: Payer: PPO

## 2022-01-23 ENCOUNTER — Emergency Department
Admission: EM | Admit: 2022-01-23 | Discharge: 2022-01-23 | Discharge status: Against Medical Advice | Payer: PPO | Source: Home / Self Care

## 2022-01-23 ENCOUNTER — Ambulatory Visit
Admission: RE | Admit: 2022-01-23 | Discharge: 2022-01-23 | Disposition: A | Discharge status: Home / Self Care | Payer: PPO | Attending: Vascular Surgery | Admitting: Vascular Surgery

## 2022-01-23 DIAGNOSIS — C060 Malignant neoplasm of cheek mucosa: Secondary | ICD-10-CM

## 2022-01-23 MED ORDER — FAMOTIDINE 20 MG PO TABS
40.0000 mg | ORAL_TABLET | Freq: Once | ORAL | Status: DC | PRN
Start: 2022-01-23 — End: 2022-01-25

## 2022-01-23 MED ORDER — CEFAZOLIN SODIUM-DEXTROSE 2-4 GM/100ML-% IV SOLN: 2.0000 g | INTRAVENOUS | Status: DC

## 2022-01-23 MED ORDER — METHYLPREDNISOLONE SODIUM SUCC 125 MG IJ SOLR
125.0000 mg | Freq: Once | INTRAMUSCULAR | Status: DC | PRN
Start: 2022-01-23 — End: 2022-01-25

## 2022-01-23 MED ORDER — SODIUM CHLORIDE 0.9 % IV SOLN: INTRAVENOUS | Status: DC

## 2022-01-23 MED ORDER — DIPHENHYDRAMINE HCL 50 MG/ML IJ SOLN
50.0000 mg | Freq: Once | INTRAMUSCULAR | Status: DC | PRN
Start: 2022-01-23 — End: 2022-01-25

## 2022-01-23 MED ORDER — GENTAMICIN SULFATE 40 MG/ML IJ SOLN
80.0000 mg | Freq: Once | INTRAMUSCULAR | Status: DC
Start: 2022-01-23 — End: 2022-01-25
  Filled 2022-01-23: qty 2

## 2022-01-23 MED ORDER — MIDAZOLAM HCL 2 MG/ML PO SYRP
8.0000 mg | ORAL_SOLUTION | Freq: Once | ORAL | Status: DC | PRN
Start: 2022-01-23 — End: 2022-01-25

## 2022-01-23 NOTE — Progress Notes (Signed)
PT presented today for procedure however pt stated he had had 2 glucerna drinks around noon. Provider made aware. PT did not want procedure without full sedation and this RN spoke to scheduler janet who reached out to Dr. Elroy Channel nurse Dondra Spry to get in contact with vascular nurse to reschedule and they will contact pt with new appointment.

## 2022-01-23 NOTE — Interval H&P Note (Signed)
History and Physical Interval Note:  01/23/2022 11:26 AM  Phillip [name]  has presented today for surgery, with the diagnosis of Porta Cath Placement   Squamous cell Ca buccal mucosa.  The various methods of treatment have been discussed with the patient and family. After consideration of risks, benefits and other options for treatment, the patient has consented to  Procedure(s): PORTA CATH INSERTION (N/A) as a surgical intervention.  [name] history has been reviewed, patient examined, no change in status, stable for surgery.  I have reviewed the patient's chart and labs.  Questions were answered to the patient's satisfaction.     Leotis Pain

## 2022-01-24 ENCOUNTER — Encounter: Payer: Self-pay | Admitting: *Deleted

## 2022-01-24 ENCOUNTER — Ambulatory Visit: Payer: PPO

## 2022-01-24 ENCOUNTER — Telehealth (INDEPENDENT_AMBULATORY_CARE_PROVIDER_SITE_OTHER): Payer: Self-pay

## 2022-01-24 NOTE — Telephone Encounter (Signed)
I received a call from sherry regarding the patient having his port placement with Dr. Lucky Cowboy. Patient has been rescheduled to 01/26/22 with a 11:15 am arrival time to the [facility]. Pre-procedure instructions will be given to the patient by [facility].

## 2022-01-25 ENCOUNTER — Ambulatory Visit: Payer: PPO

## 2022-01-25 ENCOUNTER — Inpatient Hospital Stay: Payer: PPO

## 2022-01-25 ENCOUNTER — Inpatient Hospital Stay: Payer: PPO | Admitting: Nurse Practitioner

## 2022-01-26 ENCOUNTER — Ambulatory Visit: Payer: PPO

## 2022-01-26 ENCOUNTER — Encounter: Payer: Self-pay | Admitting: Vascular Surgery

## 2022-01-26 ENCOUNTER — Encounter
Admission: RE | Disposition: A | Discharge status: Home / Self Care | Payer: Self-pay | Source: Home / Self Care | Attending: Vascular Surgery

## 2022-01-26 ENCOUNTER — Ambulatory Visit
Admission: RE | Admit: 2022-01-26 | Discharge: 2022-01-26 | Disposition: A | Discharge status: Home / Self Care | Payer: PPO | Attending: Vascular Surgery | Admitting: Vascular Surgery

## 2022-01-26 ENCOUNTER — Other Ambulatory Visit: Payer: Self-pay

## 2022-01-26 DIAGNOSIS — C109 Malignant neoplasm of oropharynx, unspecified: Secondary | ICD-10-CM | POA: Diagnosis not present

## 2022-01-26 DIAGNOSIS — I10 Essential (primary) hypertension: Secondary | ICD-10-CM | POA: Diagnosis not present

## 2022-01-26 DIAGNOSIS — C060 Malignant neoplasm of cheek mucosa: Secondary | ICD-10-CM | POA: Insufficient documentation

## 2022-01-26 DIAGNOSIS — E785 Hyperlipidemia, unspecified: Secondary | ICD-10-CM | POA: Insufficient documentation

## 2022-01-26 DIAGNOSIS — I4891 Unspecified atrial fibrillation: Secondary | ICD-10-CM

## 2022-01-26 HISTORY — PX: PORTA CATH INSERTION: CATH118285

## 2022-01-26 SURGERY — PORTA CATH INSERTION
Anesthesia: Moderate Sedation

## 2022-01-26 MED ORDER — FENTANYL CITRATE (PF) 100 MCG/2ML IJ SOLN
INTRAMUSCULAR | Status: DC | PRN
  Administered 2022-01-26: 50 ug via INTRAVENOUS

## 2022-01-26 MED ORDER — FENTANYL CITRATE (PF) 100 MCG/2ML IJ SOLN
INTRAMUSCULAR | Status: AC
  Filled 2022-01-26: qty 2

## 2022-01-26 MED ORDER — HYDROMORPHONE HCL 1 MG/ML IJ SOLN: 1.0000 mg | Freq: Once | INTRAMUSCULAR | Status: DC | PRN

## 2022-01-26 MED ORDER — MIDAZOLAM HCL 2 MG/2ML IJ SOLN
INTRAMUSCULAR | Status: AC
  Filled 2022-01-26: qty 2

## 2022-01-26 MED ORDER — CEFAZOLIN SODIUM-DEXTROSE 2-4 GM/100ML-% IV SOLN
2.0000 g | Freq: Once | INTRAVENOUS | Status: AC
  Administered 2022-01-26: 2 g via INTRAVENOUS

## 2022-01-26 MED ORDER — APIXABAN 5 MG PO TABS: 5.0000 mg | ORAL_TABLET | Freq: Two times a day (BID) | ORAL | 5 refills | Status: DC

## 2022-01-26 MED ORDER — SODIUM CHLORIDE 0.9 % IV SOLN: INTRAVENOUS | Status: DC

## 2022-01-26 MED ORDER — ONDANSETRON HCL 4 MG/2ML IJ SOLN: 4.0000 mg | Freq: Four times a day (QID) | INTRAMUSCULAR | Status: DC | PRN

## 2022-01-26 MED ORDER — MIDAZOLAM HCL 2 MG/2ML IJ SOLN
INTRAMUSCULAR | Status: DC | PRN
  Administered 2022-01-26: 2 mg via INTRAVENOUS

## 2022-01-26 SURGICAL SUPPLY — 13 items
COVER PROBE U/S 5X48 (MISCELLANEOUS) ×1 IMPLANT
COVER SURGICAL LIGHT HANDLE (MISCELLANEOUS) ×1 IMPLANT
DERMABOND ADVANCED (GAUZE/BANDAGES/DRESSINGS) ×1
DERMABOND ADVANCED .7 DNX12 (GAUZE/BANDAGES/DRESSINGS) IMPLANT
DRAPE INCISE IOBAN 66X45 STRL (DRAPES) ×1 IMPLANT
KIT PORT POWER 8FR ISP CVUE (Port) ×1 IMPLANT
NDL ENTRY 21GA 7CM ECHOTIP (NEEDLE) IMPLANT
NEEDLE ENTRY 21GA 7CM ECHOTIP (NEEDLE) ×2 IMPLANT
PACK ANGIOGRAPHY (CUSTOM PROCEDURE TRAY) ×2 IMPLANT
SPONGE XRAY 4X4 16PLY STRL (MISCELLANEOUS) ×1 IMPLANT
SUT MNCRL AB 4-0 PS2 18 (SUTURE) ×1 IMPLANT
SUT VIC AB 3-0 SH 27 (SUTURE) ×2
SUT VIC AB 3-0 SH 27X BRD (SUTURE) IMPLANT

## 2022-01-26 NOTE — Op Note (Signed)
OPERATIVE NOTE   PROCEDURE: Placement of a right IJ Infuse-a-Port  PRE-OPERATIVE DIAGNOSIS: Oropharyngeal squamous cell carcinoma  POST-OPERATIVE DIAGNOSIS: Same  SURGEON: Katha Cabal M.D.  ANESTHESIA: Conscious sedation was administered under my direct supervision by the interventional radiology RN. IV Versed plus fentanyl were utilized. Continuous ECG, pulse oximetry and blood pressure was monitored throughout the entire procedure. Conscious sedation was for a total of 32 minutes.  ESTIMATED BLOOD LOSS: Minimal   FINDING(S): 1.  Patent vein  SPECIMEN(S): None  INDICATIONS:   Phillip [name] is a 81 y.o. male who presents with biopsy-proven oropharyngeal squamous cell carcinoma.  He is undergoing chemotherapy and therefore requires appropriate parenteral access.  Infuse-a-Port is being placed.  Risks and benefits were reviewed all questions answered patient agrees to proceed.  DESCRIPTION: After obtaining full informed written consent, the patient was brought back to the special procedure suite and placed in the supine position. [name] right neck and chest wall are prepped and draped in sterile fashion. Appropriate timeout was called.  Ultrasound is placed in a sterile sleeve, ultrasound is utilized to avoid vascular injury as well as secondary to lack of appropriate landmarks. The right internal jugular vein is identified. It is echolucent and homogeneous as well as easily compressible indicating patency. An image is recorded for the permanent record.  Access to the vein with a micropuncture needle is done under direct ultrasound visualization.  1% lidocaine is infiltrated into the soft tissue at the base of the neck as well as on the chest wall.  Under direct ultrasound visualization a micro-needle is inserted into the vein followed by the micro-wire. Micro-sheath was then advanced and a J wire is inserted without difficulty under fluoroscopic guidance. A small  counterincision was created at the wire insertion site. A transverse incision is created 2 fingerbreadths below the scapula and a pocket is fashioned using both blunt and sharp dissection. The pocket is tested for appropriate size with the hub of the Infuse-a-Port. The tunneling device is then used to pull the intravascular portion of the catheter from the pocket to the neck counterincision.  Dilator and peel-away sheath were then inserted over the wire and the wire is removed. Catheter is then advanced into the venous system without difficulty. Peel-away sheath was then removed.  Catheter is then positioned under fluoroscopic guidance at the atrial caval junction. It is then transected connected to the hub and the hope is slipped into the subcutaneous pocket on the chest wall. The hub was then accessed percutaneously and aspirates easily and flushes well and is flushed with 30 cc of heparinized saline. The pocket incision is then closed in layers using interrupted 3-0 Vicryl for the subcutaneous tissues and 4-0 Monocryl subcuticular for skin closure. Dermabond is applied. The neck counterincision was closed with 4-0 Monocryl subcuticular and Dermabond as well.  The patient tolerated the procedure well and there were no immediate complications.  COMPLICATIONS: None  CONDITION: Unchanged  Katha Cabal M.D. [facility] Office: [phone]   01/26/2022, 2:49 PM

## 2022-01-26 NOTE — H&P (View-Only) (Signed)
$'@LOGO'j$ @   MRN : [id number]  Phillip [name] is a 81 y.o. (March 29, 1941) male who presents with chief complaint of need a port.  History of Present Illness:   I am asked to evaluate the patient by Dr. Janese Banks.  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  He presented to ENT with symptoms of her right cheek mucosal irritation which did not get better after topical remedies.  This was ultimately biopsied by Dr. Tami Ribas and came back positive for squamous cell carcinoma while differentiated and keratinizing.  This has not been deemed to be resectable by ENT and therefore referred for consideration of radiation or chemotherapy.  PET CT scan did not show anyEvidence of locoregional or distant metastatic disease.  Patient received definitive radiation to this area.  A few weeks after radiation was completed patient developed a rapidly growing mass anterior to his original site of disease involvement.   He had a repeat biopsy by ENT which showed squamous cell carcinoma with basaloid features and minimal focal keratinization.  Repeat PET CT scan showed3.4 x 2.2 cm ill-defined soft tissue mass within the soft tissues lateral to the mandible on the right side.  Hypermetabolic activity in the right lateral submandibular space at the level of the angle of mandible with an SUV of 4.5 suspicious for 1B nodal metastases.  Small hypermetabolic level 3 activity bilaterally with an SUV of 3.9 and 3.5 respectively concerning for possible disease involvement.  No evidence of distant metastatic disease.   Current Meds  Medication Sig   amLODipine (NORVASC) 10 MG tablet Take 1 tablet by mouth once daily    Past Medical History:  Diagnosis Date   Anxiety    Basal cell carcinoma 09/12/2018   Left forehead above lateral brow. Nodular pattern   Cancer ([place])    Coronary artery disease  2003   a. nonobstructive CAD by cath in 2003 and 2005; b.  Status post three-vessel CABG on 12/12/2017 with LIMA to LAD, sequential reverse SVG to OM1 and distal LCx   COVID-19 03/2020   Diverticulosis    Hearing loss    bilateral   Heart disease    Heart murmur    History of benign prostatic hypertrophy    History of radiation therapy 01/31/10-03/22/10   right tonsil/right neck node 7000 cGy 35 sessions, high risk lymph node volume 5940 cGy 35 sessions, low risk lymph node vol 5600 cGy 35 sessions   HPV in male    positive   HTN (hypertension)    Hyperlipidemia    diet controlled- taking medication d/t ensure drinking   Hypothyroid 01/16/2013   Ischemic cardiomyopathy    a. 10/2017: echo showing reduced EF of 40-45%, HK of the anterior, anteroseptal and apical myocardium with Grade 1 DD.    Neck pain    Persistent atrial fibrillation (HCC)    Primary squamous cell carcinoma of skin of lip 2023   lip and tonuge   Squamous cell carcinoma    right tonsil- 2011   Squamous cell carcinoma of skin 09/02/2019   Crown scalp. WD SCC   Squamous cell carcinoma of skin 09/02/2019   Left distal lateral deltoid. SCCis arising in SK   Thrombocytopenia ([place]) 02/02/2012   unclear etiology   Tinnitus    Tubular adenoma of colon     Past Surgical History:  Procedure Laterality Date   artoscopic  knee     CARDIAC CATHETERIZATION  2003, 2005   40% blockage, two 30% blockages treated medically Ron Parker)   CARDIOVERSION N/A 02/12/2018   Procedure: CARDIOVERSION;  Surgeon: [name], MD;  Location: [facility];  Service: Cardiovascular;  Laterality: N/A;   CARDIOVERSION N/A 05/10/2021   Procedure: CARDIOVERSION;  Surgeon: [name], MD;  Location: [facility];  Service: Cardiovascular;  Laterality: N/A;   COLONOSCOPY  03/2015   TA x3, HP, melanosis coli, severe diverticulosis, rpt 3 yrs ([place])   COLONOSCOPY  01/2019   fair prep, diverticulosis, f/u PRN ([place])   CORONARY ARTERY BYPASS GRAFT  N/A 12/12/2017   Procedure: CORONARY ARTERY BYPASS GRAFTING (CABG) x3 using the right greater saphenous vein harvested endoscopically and the left internal mammary artery. LIMA to LAD, SEQ SVG to OM1 & OM2;  Surgeon: [name], MD;  Location: [place];  Service: Open Heart Surgery;  Laterality: N/A;   DENTAL SURGERY     extractions   IR THORACENTESIS ASP PLEURAL SPACE W/IMG GUIDE  01/17/2018   KNEE ARTHROSCOPY Left    LEFT HEART CATH AND CORONARY ANGIOGRAPHY N/A 12/10/2017   Procedure: LEFT HEART CATH AND CORONARY ANGIOGRAPHY;  Surgeon: [name], MD;  Location: [place] [facility];  Service: Cardiovascular;  Laterality: N/A;   PEG PLACEMENT  02/2010   TEE WITHOUT CARDIOVERSION N/A 12/12/2017   Procedure: TRANSESOPHAGEAL ECHOCARDIOGRAM (TEE);  Surgeon: [name], MD;  Location: [place];  Service: Open Heart Surgery;  Laterality: N/A;   TONSILLECTOMY     triple bypass  11/2017   VASECTOMY      Social History Social History   Tobacco Use   Smoking status: Never   Smokeless tobacco: Never  Vaping Use   Vaping Use: Never used  Substance Use Topics   Alcohol use: Yes    Comment: glass of wine 1-2 times a month   Drug use: No    Family History Family History  Problem Relation Age of Onset   Coronary artery disease Father    Heart attack Father 82   Heart disease Father    Stroke Mother    Colon cancer Neg Hx    Esophageal cancer Neg Hx    Rectal cancer Neg Hx    Stomach cancer Neg Hx    Kidney cancer Neg Hx    Kidney failure Neg Hx    Prostate cancer Neg Hx    Tuberculosis Neg Hx     Allergies  Allergen Reactions   Ace Inhibitors Swelling and Other (See Comments)    Possible angioedema (upper lip swelling)   Morphine And Related Swelling    SWELLING REACTION UNSPECIFIED  [severity rated per PMH, 12/11/2017]     REVIEW OF SYSTEMS (Negative unless checked)  Constitutional: '[]'$ Weight loss  '[]'$ Fever  '[]'$ Chills Cardiac: '[]'$ Chest pain   '[]'$ Chest pressure    '[]'$ Palpitations   '[]'$ Shortness of breath when laying flat   '[]'$ Shortness of breath with exertion. Vascular:  '[]'$ Pain in legs with walking   '[x]'$ Pain in legs at rest  '[]'$ History of DVT   '[]'$ Phlebitis   '[x]'$ Swelling in legs   '[]'$ Varicose veins   '[]'$ Non-healing ulcers Pulmonary:   '[]'$ Uses home oxygen   '[]'$ Productive cough   '[]'$ Hemoptysis   '[]'$ Wheeze  '[]'$ COPD   '[]'$ Asthma Neurologic:  '[]'$ Dizziness   '[]'$ Seizures   '[]'$ History of stroke   '[]'$ History of TIA  '[]'$ Aphasia   '[]'$ Vissual changes   '[]'$ Weakness or numbness in arm   '[]'$ Weakness or numbness in leg Musculoskeletal:   '[]'$   Joint swelling   '[]'$ Joint pain   '[]'$ Low back pain Hematologic:  '[]'$ Easy bruising  '[]'$ Easy bleeding   '[]'$ Hypercoagulable state   '[]'$ Anemic Gastrointestinal:  '[]'$ Diarrhea   '[]'$ Vomiting  '[]'$ Gastroesophageal reflux/heartburn   '[]'$ Difficulty swallowing. Genitourinary:  '[]'$ Chronic kidney disease   '[]'$ Difficult urination  '[]'$ Frequent urination   '[]'$ Blood in urine Skin:  '[]'$ Rashes   '[]'$ Ulcers  Psychological:  '[]'$ History of anxiety   '[]'$  History of major depression.  Physical Examination  There were no vitals filed for this visit. There is no height or weight on file to calculate BMI. Gen: WD/WN, NAD Head: [place]/AT, No temporalis wasting.  Ear/Nose/Throat: Hearing grossly intact, nares w/o erythema or drainage, pinna without lesions Eyes: PER, EOMI, sclera nonicteric.  Neck: Supple, no gross masses.  No JVD.  Pulmonary:  Good air movement, no audible wheezing, no use of accessory muscles.  Cardiac: RRR, precordium not hyperdynamic. Vascular:   Vessel Right Left  Radial Palpable Palpable  Gastrointestinal: soft, non-distended. No guarding/no peritoneal signs.  Musculoskeletal: M/S 5/5 throughout.  No deformity.  Neurologic: CN 2-12 intact. Pain and light touch intact in extremities.  Symmetrical.  Speech is fluent. Motor exam as listed above. Psychiatric: Judgment intact, Mood & affect appropriate for pt's clinical situation. Dermatologic: Venous rashes no ulcers noted.  No  changes consistent with cellulitis. Lymph : No lichenification or skin changes of chronic lymphedema.  CBC Lab Results  Component Value Date   WBC 8.2 12/14/2021   HGB 15.0 12/14/2021   HCT 44.0 12/14/2021   MCV 97.1 12/14/2021   PLT 156 12/14/2021    BMET    Component Value Date/Time   NA 142 05/13/2021 0955   NA 141 12/29/2013 1351   K 4.4 05/13/2021 0955   K 4.2 12/29/2013 1351   CL 103 05/13/2021 0955   CL 106 01/16/2013 0938   CO2 24 05/13/2021 0955   CO2 23 12/29/2013 1351   GLUCOSE 117 (H) 05/13/2021 0955   GLUCOSE 129 (H) 05/10/2021 0749   GLUCOSE 125 12/29/2013 1351   GLUCOSE 108 (H) 01/16/2013 0938   BUN 15 05/13/2021 0955   BUN 17.2 12/29/2013 1351   CREATININE 0.77 05/13/2021 0955   CREATININE 0.9 12/29/2013 1351   CALCIUM 9.5 05/13/2021 0955   CALCIUM 9.9 12/29/2013 1351   GFRNONAA >60 05/10/2021 0749   GFRAA 97 07/10/2018 1110   CrCl cannot be calculated (Patient's most recent lab result is older than the maximum 21 days allowed.).  COAG Lab Results  Component Value Date   INR 1.41 12/12/2017   INR 1.26 12/11/2017    Radiology NM PET Image Initial (PI) Skull Base To Thigh  Result Date: 01/15/2022 CLINICAL DATA:  Initial treatment strategy for squamous cell carcinoma of the buccal mucosa. EXAM: NUCLEAR MEDICINE PET SKULL BASE TO THIGH TECHNIQUE: 8.8 mCi F-18 FDG was injected intravenously. Full-ring PET imaging was performed from the skull base to thigh after the radiotracer. CT data was obtained and used for attenuation correction and anatomic localization. Fasting blood glucose: 144 mg/dl COMPARISON:  PET-CT 10/25/2021. CT neck 01/30/2012 and abdominopelvic CT 03/13/2010. FINDINGS: Mediastinal blood pool activity: SUV max 2.4 NECK: Intense focal hypermetabolic activity within the soft tissues lateral to the mandible on the right (SUV max 9.7), corresponding with an ill-defined soft tissue mass measuring approximately 3.4 x 2.2 cm on image 38/4. There is  asymmetric hypermetabolic activity in the right lateral submandibular space at the level of the angle of the mandible (SUV max 4.5), suspicious for a 1B nodal metastasis.  No enlarged cervical lymph nodes are identified. However, there are small hypermetabolic level III bilaterally (SUV max 3.9 on the right and 3.5 on the left).No other definite lesions of the pharyngeal mucosal space. Activity within the lymphoid tissue of Waldeyer's ring is within physiologic limits. Probable left mandibular dental caries. Incidental CT findings: Bilateral carotid atherosclerosis. As above, probable left mandibular dental caries. CHEST: There are no hypermetabolic mediastinal, hilar or axillary lymph nodes. No hypermetabolic pulmonary activity or suspicious nodularity. Incidental CT findings: Status post median sternotomy and CABG. There is diffuse atherosclerosis of the aorta, great vessels and coronary arteries. Aortic valvular calcifications are present. There is a small hiatal hernia. The lungs are clear. ABDOMEN/PELVIS: There is no hypermetabolic activity within the liver, adrenal glands, spleen or pancreas. There is no hypermetabolic nodal activity. Incidental CT findings: Similar low-density renal lesions bilaterally without hypermetabolic activity, consistent with incidental cysts; no follow-up imaging recommended. Aortic atherosclerosis. Distal colonic diverticulosis. SKELETON: There is no hypermetabolic activity to suggest osseous metastatic disease. No gross mandibular osseous destruction. Facial assessment is mildly limited by beam hardening artifact from the patient's dental amalgam. Incidental CT findings: none IMPRESSION: 1. Intensely hypermetabolic right mandibular buccal soft tissue mass consistent with known squamous cell carcinoma. Suspected small nodal metastases involving level 1B on the right and level III bilaterally. 2. No evidence of distant metastatic disease. 3. Carotid, coronary and Aortic  Atherosclerosis (ICD10-I70.0). Electronically Signed   By: Richardean Sale M.D.   On: 01/15/2022 11:16     Assessment/Plan Squamous cell carcinoma of the oropharynx:             The patient will require chemotherapy and therefore requires appropriate parenteral IV access.  Risks and benefits for Infuse-a-Port placement have been reviewed all questions answered patient has agreed to proceed.  Arrangements will be made for port placement as an outpatient.  2.  Hypertension: Continue antihypertensive medications as already ordered, these medications have been reviewed and there are no changes at this time.   3.  Hyperlipidemia: Continue statin as ordered and reviewed, no changes at this time   Hortencia Pilar, MD  01/26/2022 1:53 PM

## 2022-01-26 NOTE — Interval H&P Note (Signed)
History and Physical Interval Note:  01/26/2022 1:59 PM  Phillip [name]  has presented today for surgery, with the diagnosis of Porta Cath Placement   Squamous cell carcinoma of buccal mucosa.  The various methods of treatment have been discussed with the patient and family. After consideration of risks, benefits and other options for treatment, the patient has consented to  Procedure(s): PORTA CATH INSERTION (N/A) as a surgical intervention.  [name] history has been reviewed, patient examined, no change in status, stable for surgery.  I have reviewed the patient's chart and labs.  Questions were answered to the patient's satisfaction.     Hortencia Pilar

## 2022-01-26 NOTE — Consult Note (Signed)
$'@LOGO'J$ @   MRN : [id number]  Phillip [name] is a 81 y.o. (1941-02-03) male who presents with chief complaint of need a port.  History of Present Illness:   I am asked to evaluate the patient by Dr. Janese Banks.  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  He presented to ENT with symptoms of her right cheek mucosal irritation which did not get better after topical remedies.  This was ultimately biopsied by Dr. Tami Ribas and came back positive for squamous cell carcinoma while differentiated and keratinizing.  This has not been deemed to be resectable by ENT and therefore referred for consideration of radiation or chemotherapy.  PET CT scan did not show anyEvidence of locoregional or distant metastatic disease.  Patient received definitive radiation to this area.  A few weeks after radiation was completed patient developed a rapidly growing mass anterior to his original site of disease involvement.   He had a repeat biopsy by ENT which showed squamous cell carcinoma with basaloid features and minimal focal keratinization.  Repeat PET CT scan showed3.4 x 2.2 cm ill-defined soft tissue mass within the soft tissues lateral to the mandible on the right side.  Hypermetabolic activity in the right lateral submandibular space at the level of the angle of mandible with an SUV of 4.5 suspicious for 1B nodal metastases.  Small hypermetabolic level 3 activity bilaterally with an SUV of 3.9 and 3.5 respectively concerning for possible disease involvement.  No evidence of distant metastatic disease.   Current Meds  Medication Sig   amLODipine (NORVASC) 10 MG tablet Take 1 tablet by mouth once daily    Past Medical History:  Diagnosis Date   Anxiety    Basal cell carcinoma 09/12/2018   Left forehead above lateral brow. Nodular pattern   Cancer ([place])    Coronary artery disease  2003   a. nonobstructive CAD by cath in 2003 and 2005; b.  Status post three-vessel CABG on 12/12/2017 with LIMA to LAD, sequential reverse SVG to OM1 and distal LCx   COVID-19 03/2020   Diverticulosis    Hearing loss    bilateral   Heart disease    Heart murmur    History of benign prostatic hypertrophy    History of radiation therapy 01/31/10-03/22/10   right tonsil/right neck node 7000 cGy 35 sessions, high risk lymph node volume 5940 cGy 35 sessions, low risk lymph node vol 5600 cGy 35 sessions   HPV in male    positive   HTN (hypertension)    Hyperlipidemia    diet controlled- taking medication d/t ensure drinking   Hypothyroid 01/16/2013   Ischemic cardiomyopathy    a. 10/2017: echo showing reduced EF of 40-45%, HK of the anterior, anteroseptal and apical myocardium with Grade 1 DD.    Neck pain    Persistent atrial fibrillation (H[facility])    Primary squamous cell carcinoma of skin of lip 2023   lip and tonuge   Squamous cell carcinoma    right tonsil- 2011   Squamous cell carcinoma of skin 09/02/2019   Crown scalp. WD SCC   Squamous cell carcinoma of skin 09/02/2019   Left distal lateral deltoid. SCCis arising in SK   Thrombocytopenia ([place]) 02/02/2012   unclear etiology   Tinnitus    Tubular adenoma of colon     Past Surgical History:  Procedure Laterality Date   artoscopic  knee     CARDIAC CATHETERIZATION  2003, 2005   40% blockage, two 30% blockages treated medically Ron Parker)   CARDIOVERSION N/A 02/12/2018   Procedure: CARDIOVERSION;  Surgeon: [name], MD;  Location: [facility];  Service: Cardiovascular;  Laterality: N/A;   CARDIOVERSION N/A 05/10/2021   Procedure: CARDIOVERSION;  Surgeon: [name], MD;  Location: [facility];  Service: Cardiovascular;  Laterality: N/A;   COLONOSCOPY  03/2015   TA x3, HP, melanosis coli, severe diverticulosis, rpt 3 yrs ([place])   COLONOSCOPY  01/2019   fair prep, diverticulosis, f/u PRN ([place])   CORONARY ARTERY BYPASS GRAFT  N/A 12/12/2017   Procedure: CORONARY ARTERY BYPASS GRAFTING (CABG) x3 using the right greater saphenous vein harvested endoscopically and the left internal mammary artery. LIMA to LAD, SEQ SVG to OM1 & OM2;  Surgeon: [name], MD;  Location: [facility];  Service: Open Heart Surgery;  Laterality: N/A;   DENTAL SURGERY     extractions   IR THORACENTESIS ASP PLEURAL SPACE W/IMG GUIDE  01/17/2018   KNEE ARTHROSCOPY Left    LEFT HEART CATH AND CORONARY ANGIOGRAPHY N/A 12/10/2017   Procedure: LEFT HEART CATH AND CORONARY ANGIOGRAPHY;  Surgeon: [name], MD;  Location: [facility];  Service: Cardiovascular;  Laterality: N/A;   PEG PLACEMENT  02/2010   TEE WITHOUT CARDIOVERSION N/A 12/12/2017   Procedure: TRANSESOPHAGEAL ECHOCARDIOGRAM (TEE);  Surgeon: [name], MD;  Location: [facility];  Service: Open Heart Surgery;  Laterality: N/A;   TONSILLECTOMY     triple bypass  11/2017   VASECTOMY      Social History Social History   Tobacco Use   Smoking status: Never   Smokeless tobacco: Never  Vaping Use   Vaping Use: Never used  Substance Use Topics   Alcohol use: Yes    Comment: glass of wine 1-2 times a month   Drug use: No    Family History Family History  Problem Relation Age of Onset   Coronary artery disease Father    Heart attack Father 35   Heart disease Father    Stroke Mother    Colon cancer Neg Hx    Esophageal cancer Neg Hx    Rectal cancer Neg Hx    Stomach cancer Neg Hx    Kidney cancer Neg Hx    Kidney failure Neg Hx    Prostate cancer Neg Hx    Tuberculosis Neg Hx     Allergies  Allergen Reactions   Ace Inhibitors Swelling and Other (See Comments)    Possible angioedema (upper lip swelling)   Morphine And Related Swelling    SWELLING REACTION UNSPECIFIED  [severity rated per PMH, 12/11/2017]     REVIEW OF SYSTEMS (Negative unless checked)  Constitutional: '[]'$ Weight loss  '[]'$ Fever  '[]'$ Chills Cardiac: '[]'$ Chest pain   '[]'$ Chest pressure    '[]'$ Palpitations   '[]'$ Shortness of breath when laying flat   '[]'$ Shortness of breath with exertion. Vascular:  '[]'$ Pain in legs with walking   '[x]'$ Pain in legs at rest  '[]'$ History of DVT   '[]'$ Phlebitis   '[x]'$ Swelling in legs   '[]'$ Varicose veins   '[]'$ Non-healing ulcers Pulmonary:   '[]'$ Uses home oxygen   '[]'$ Productive cough   '[]'$ Hemoptysis   '[]'$ Wheeze  '[]'$ COPD   '[]'$ Asthma Neurologic:  '[]'$ Dizziness   '[]'$ Seizures   '[]'$ History of stroke   '[]'$ History of TIA  '[]'$ Aphasia   '[]'$ Vissual changes   '[]'$ Weakness or numbness in arm   '[]'$ Weakness or numbness in leg Musculoskeletal:   '[]'$   Joint swelling   '[]'$ Joint pain   '[]'$ Low back pain Hematologic:  '[]'$ Easy bruising  '[]'$ Easy bleeding   '[]'$ Hypercoagulable state   '[]'$ Anemic Gastrointestinal:  '[]'$ Diarrhea   '[]'$ Vomiting  '[]'$ Gastroesophageal reflux/heartburn   '[]'$ Difficulty swallowing. Genitourinary:  '[]'$ Chronic kidney disease   '[]'$ Difficult urination  '[]'$ Frequent urination   '[]'$ Blood in urine Skin:  '[]'$ Rashes   '[]'$ Ulcers  Psychological:  '[]'$ History of anxiety   '[]'$  History of major depression.  Physical Examination  There were no vitals filed for this visit. There is no height or weight on file to calculate BMI. Gen: WD/WN, NAD Head: [place]/AT, No temporalis wasting.  Ear/Nose/Throat: Hearing grossly intact, nares w/o erythema or drainage, pinna without lesions Eyes: PER, EOMI, sclera nonicteric.  Neck: Supple, no gross masses.  No JVD.  Pulmonary:  Good air movement, no audible wheezing, no use of accessory muscles.  Cardiac: RRR, precordium not hyperdynamic. Vascular:   Vessel Right Left  Radial Palpable Palpable  Gastrointestinal: soft, non-distended. No guarding/no peritoneal signs.  Musculoskeletal: M/S 5/5 throughout.  No deformity.  Neurologic: CN 2-12 intact. Pain and light touch intact in extremities.  Symmetrical.  Speech is fluent. Motor exam as listed above. Psychiatric: Judgment intact, Mood & affect appropriate for pt's clinical situation. Dermatologic: Venous rashes no ulcers noted.  No  changes consistent with cellulitis. Lymph : No lichenification or skin changes of chronic lymphedema.  CBC Lab Results  Component Value Date   WBC 8.2 12/14/2021   HGB 15.0 12/14/2021   HCT 44.0 12/14/2021   MCV 97.1 12/14/2021   PLT 156 12/14/2021    BMET    Component Value Date/Time   NA 142 05/13/2021 0955   NA 141 12/29/2013 1351   K 4.4 05/13/2021 0955   K 4.2 12/29/2013 1351   CL 103 05/13/2021 0955   CL 106 01/16/2013 0938   CO2 24 05/13/2021 0955   CO2 23 12/29/2013 1351   GLUCOSE 117 (H) 05/13/2021 0955   GLUCOSE 129 (H) 05/10/2021 0749   GLUCOSE 125 12/29/2013 1351   GLUCOSE 108 (H) 01/16/2013 0938   BUN 15 05/13/2021 0955   BUN 17.2 12/29/2013 1351   CREATININE 0.77 05/13/2021 0955   CREATININE 0.9 12/29/2013 1351   CALCIUM 9.5 05/13/2021 0955   CALCIUM 9.9 12/29/2013 1351   GFRNONAA >60 05/10/2021 0749   GFRAA 97 07/10/2018 1110   CrCl cannot be calculated (Patient's most recent lab result is older than the maximum 21 days allowed.).  COAG Lab Results  Component Value Date   INR 1.41 12/12/2017   INR 1.26 12/11/2017    Radiology NM PET Image Initial (PI) Skull Base To Thigh  Result Date: 01/15/2022 CLINICAL DATA:  Initial treatment strategy for squamous cell carcinoma of the buccal mucosa. EXAM: NUCLEAR MEDICINE PET SKULL BASE TO THIGH TECHNIQUE: 8.8 mCi F-18 FDG was injected intravenously. Full-ring PET imaging was performed from the skull base to thigh after the radiotracer. CT data was obtained and used for attenuation correction and anatomic localization. Fasting blood glucose: 144 mg/dl COMPARISON:  PET-CT 10/25/2021. CT neck 01/30/2012 and abdominopelvic CT 03/13/2010. FINDINGS: Mediastinal blood pool activity: SUV max 2.4 NECK: Intense focal hypermetabolic activity within the soft tissues lateral to the mandible on the right (SUV max 9.7), corresponding with an ill-defined soft tissue mass measuring approximately 3.4 x 2.2 cm on image 38/4. There is  asymmetric hypermetabolic activity in the right lateral submandibular space at the level of the angle of the mandible (SUV max 4.5), suspicious for a 1B nodal metastasis.  No enlarged cervical lymph nodes are identified. However, there are small hypermetabolic level III bilaterally (SUV max 3.9 on the right and 3.5 on the left).No other definite lesions of the pharyngeal mucosal space. Activity within the lymphoid tissue of Waldeyer's ring is within physiologic limits. Probable left mandibular dental caries. Incidental CT findings: Bilateral carotid atherosclerosis. As above, probable left mandibular dental caries. CHEST: There are no hypermetabolic mediastinal, hilar or axillary lymph nodes. No hypermetabolic pulmonary activity or suspicious nodularity. Incidental CT findings: Status post median sternotomy and CABG. There is diffuse atherosclerosis of the aorta, great vessels and coronary arteries. Aortic valvular calcifications are present. There is a small hiatal hernia. The lungs are clear. ABDOMEN/PELVIS: There is no hypermetabolic activity within the liver, adrenal glands, spleen or pancreas. There is no hypermetabolic nodal activity. Incidental CT findings: Similar low-density renal lesions bilaterally without hypermetabolic activity, consistent with incidental cysts; no follow-up imaging recommended. Aortic atherosclerosis. Distal colonic diverticulosis. SKELETON: There is no hypermetabolic activity to suggest osseous metastatic disease. No gross mandibular osseous destruction. Facial assessment is mildly limited by beam hardening artifact from the patient's dental amalgam. Incidental CT findings: none IMPRESSION: 1. Intensely hypermetabolic right mandibular buccal soft tissue mass consistent with known squamous cell carcinoma. Suspected small nodal metastases involving level 1B on the right and level III bilaterally. 2. No evidence of distant metastatic disease. 3. Carotid, coronary and Aortic  Atherosclerosis (ICD10-I70.0). Electronically Signed   By: Richardean Sale M.D.   On: 01/15/2022 11:16     Assessment/Plan Squamous cell carcinoma of the oropharynx:             The patient will require chemotherapy and therefore requires appropriate parenteral IV access.  Risks and benefits for Infuse-a-Port placement have been reviewed all questions answered patient has agreed to proceed.  Arrangements will be made for port placement as an outpatient.  2.  Hypertension: Continue antihypertensive medications as already ordered, these medications have been reviewed and there are no changes at this time.   3.  Hyperlipidemia: Continue statin as ordered and reviewed, no changes at this time   Hortencia Pilar, MD  01/26/2022 1:53 PM

## 2022-01-27 ENCOUNTER — Encounter: Payer: Self-pay | Admitting: Vascular Surgery

## 2022-01-27 ENCOUNTER — Ambulatory Visit: Payer: PPO

## 2022-01-29 ENCOUNTER — Other Ambulatory Visit: Payer: Self-pay | Admitting: Internal Medicine

## 2022-01-30 ENCOUNTER — Inpatient Hospital Stay (HOSPITAL_BASED_OUTPATIENT_CLINIC_OR_DEPARTMENT_OTHER): Payer: PPO | Admitting: Nurse Practitioner

## 2022-01-30 ENCOUNTER — Encounter: Payer: Self-pay | Admitting: Nurse Practitioner

## 2022-01-30 ENCOUNTER — Telehealth: Payer: Self-pay | Admitting: Internal Medicine

## 2022-01-30 ENCOUNTER — Other Ambulatory Visit: Payer: Self-pay

## 2022-01-30 ENCOUNTER — Ambulatory Visit: Payer: PPO

## 2022-01-30 ENCOUNTER — Ambulatory Visit
Admission: RE | Admit: 2022-01-30 | Discharge: 2022-01-30 | Disposition: A | Discharge status: Home / Self Care | Payer: PPO | Source: Ambulatory Visit | Attending: Radiation Oncology | Admitting: Radiation Oncology

## 2022-01-30 ENCOUNTER — Inpatient Hospital Stay: Payer: PPO

## 2022-01-30 VITALS — BP 144/104 | HR 108 | Resp 18

## 2022-01-30 VITALS — BP 105/66 | HR 87 | Temp 98.2°F | Resp 16 | Ht 74.0 in | Wt 170.0 lb

## 2022-01-30 DIAGNOSIS — C069 Malignant neoplasm of mouth, unspecified: Secondary | ICD-10-CM

## 2022-01-30 DIAGNOSIS — Z5111 Encounter for antineoplastic chemotherapy: Secondary | ICD-10-CM | POA: Diagnosis not present

## 2022-01-30 DIAGNOSIS — Z95828 Presence of other vascular implants and grafts: Secondary | ICD-10-CM

## 2022-01-30 DIAGNOSIS — C4402 Squamous cell carcinoma of skin of lip: Secondary | ICD-10-CM | POA: Diagnosis not present

## 2022-01-30 DIAGNOSIS — I4891 Unspecified atrial fibrillation: Secondary | ICD-10-CM

## 2022-01-30 LAB — CBC WITH DIFFERENTIAL/PLATELET
Abs Immature Granulocytes: 0.04 10*3/uL (ref 0.00–0.07)
Basophils Absolute: 0.1 10*3/uL (ref 0.0–0.1)
Basophils Relative: 0 %
Eosinophils Absolute: 0.1 10*3/uL (ref 0.0–0.5)
Eosinophils Relative: 0 %
HCT: 49.2 % (ref 39.0–52.0)
Hemoglobin: 16.7 g/dL (ref 13.0–17.0)
Immature Granulocytes: 0 %
Lymphocytes Relative: 20 %
Lymphs Abs: 2.4 10*3/uL (ref 0.7–4.0)
MCH: 32.9 pg (ref 26.0–34.0)
MCHC: 33.9 g/dL (ref 30.0–36.0)
MCV: 97 fL (ref 80.0–100.0)
Monocytes Absolute: 0.6 10*3/uL (ref 0.1–1.0)
Monocytes Relative: 5 %
Neutro Abs: 8.8 10*3/uL — ABNORMAL HIGH (ref 1.7–7.7)
Neutrophils Relative %: 75 %
Platelets: 191 10*3/uL (ref 150–400)
RBC: 5.07 MIL/uL (ref 4.22–5.81)
RDW: 12.2 % (ref 11.5–15.5)
WBC: 12 10*3/uL — ABNORMAL HIGH (ref 4.0–10.5)
nRBC: 0 % (ref 0.0–0.2)

## 2022-01-30 LAB — RAD ONC ARIA SESSION SUMMARY
Course Elapsed Days: 0
Plan Fractions Treated to Date: 1
Plan Prescribed Dose Per Fraction: 2 Gy
Plan Total Fractions Prescribed: 25
Plan Total Prescribed Dose: 50 Gy
Reference Point Dosage Given to Date: 2 Gy
Reference Point Session Dosage Given: 2 Gy
Session Number: 1

## 2022-01-30 LAB — COMPREHENSIVE METABOLIC PANEL
ALT: 16 U/L (ref 0–44)
AST: 28 U/L (ref 15–41)
Albumin: 3.8 g/dL (ref 3.5–5.0)
Alkaline Phosphatase: 58 U/L (ref 38–126)
Anion gap: 11 (ref 5–15)
BUN: 24 mg/dL — ABNORMAL HIGH (ref 8–23)
CO2: 26 mmol/L (ref 22–32)
Calcium: 9.3 mg/dL (ref 8.9–10.3)
Chloride: 103 mmol/L (ref 98–111)
Creatinine, Ser: 0.92 mg/dL (ref 0.61–1.24)
GFR, Estimated: >60 mL/min (ref >60)
Glucose, Bld: 179 mg/dL — ABNORMAL HIGH (ref 70–99)
Potassium: 3.7 mmol/L (ref 3.5–5.1)
Sodium: 140 mmol/L (ref 135–145)
Total Bilirubin: 1 mg/dL (ref 0.3–1.2)
Total Protein: 6.9 g/dL (ref 6.5–8.1)

## 2022-01-30 MED ORDER — FAMOTIDINE IN NACL 20-0.9 MG/50ML-% IV SOLN
20.0000 mg | Freq: Once | INTRAVENOUS | Status: AC
  Administered 2022-01-30: 20 mg via INTRAVENOUS
  Filled 2022-01-30: qty 50

## 2022-01-30 MED ORDER — SODIUM CHLORIDE 0.9 % IV SOLN
180.0000 mg | Freq: Once | INTRAVENOUS | Status: AC
  Administered 2022-01-30: 180 mg via INTRAVENOUS
  Filled 2022-01-30: qty 18

## 2022-01-30 MED ORDER — SODIUM CHLORIDE 0.9 % IV SOLN
Freq: Once | INTRAVENOUS | Status: AC
  Filled 2022-01-30: qty 250

## 2022-01-30 MED ORDER — HEPARIN SOD (PORK) LOCK FLUSH 100 UNIT/ML IV SOLN
500.0000 [IU] | Freq: Once | INTRAVENOUS | Status: AC
  Administered 2022-01-30: 500 [IU] via INTRAVENOUS
  Filled 2022-01-30: qty 5

## 2022-01-30 MED ORDER — SODIUM CHLORIDE 0.9 % IV SOLN
INTRAVENOUS | Status: DC
  Filled 2022-01-30: qty 250

## 2022-01-30 MED ORDER — ONDANSETRON HCL 8 MG PO TABS: 8.0000 mg | ORAL_TABLET | Freq: Two times a day (BID) | ORAL | 1 refills | Status: AC | PRN

## 2022-01-30 MED ORDER — DIPHENHYDRAMINE HCL 50 MG/ML IJ SOLN
50.0000 mg | Freq: Once | INTRAMUSCULAR | Status: AC
  Administered 2022-01-30: 50 mg via INTRAVENOUS
  Filled 2022-01-30: qty 1

## 2022-01-30 MED ORDER — SODIUM CHLORIDE 0.9 % IV SOLN
10.0000 mg | Freq: Once | INTRAVENOUS | Status: AC
  Administered 2022-01-30: 10 mg via INTRAVENOUS
  Filled 2022-01-30: qty 10

## 2022-01-30 MED ORDER — SODIUM CHLORIDE 0.9 % IV SOLN
50.0000 mg/m2 | Freq: Once | INTRAVENOUS | Status: AC
  Administered 2022-01-30: 102 mg via INTRAVENOUS
  Filled 2022-01-30: qty 17

## 2022-01-30 MED ORDER — PALONOSETRON HCL INJECTION 0.25 MG/5ML
0.2500 mg | Freq: Once | INTRAVENOUS | Status: AC
  Administered 2022-01-30: 0.25 mg via INTRAVENOUS
  Filled 2022-01-30: qty 5

## 2022-01-30 NOTE — Progress Notes (Signed)
Error

## 2022-01-30 NOTE — Telephone Encounter (Signed)
Patient c/o Palpitations:  High priority if patient c/o lightheadedness, shortness of breath, or chest pain  How long have you had palpitations/irregular HR/ Afib? Are you having the symptoms now? 7 days  Are you currently experiencing lightheadedness, SOB or CP? Lightheaded  Do you have a history of afib (atrial fibrillation) or irregular heart rhythm? Yes  Have you checked your BP or HR? (document readings if available):  105/66 HR 87 - This morning 126/85 HR - 95 - Now Are you experiencing any other symptoms? No  Pt wife states that pt was at Cancer treatment and they confirmed that pt was in Afib. Please advise

## 2022-01-30 NOTE — Progress Notes (Signed)
Confirmed dose of carboplatin to be 180 mg with capped creatinine of 1 for calculation.  Dose adjusted to reflect corrected dose based on age.  T.O. Dr Lars Masson, PharmD

## 2022-01-30 NOTE — Progress Notes (Signed)
Hematology/Oncology Consult Note [facility]  [phone] [phone]  Patient Care Team: [name], MD as PCP - General (Family Medicine) End, Harrell Gave, MD as PCP - Cardiology (Cardiology) Heath Lark, MD as Consulting Physician (Hematology and Oncology) Marijean Niemann, OD as Consulting Physician (Optometry) Sindy Guadeloupe, MD as Consulting Physician (Oncology) Noreene Filbert, MD as Consulting Physician (Radiation Oncology)   Name of the patient: Phillip [name]  [id number]  Dec 23, 1940   Date of visit: 01/30/22  Diagnosis- 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  He presented to ENT with symptoms of her right cheek mucosal irritation which did not get better after topical remedies.  This was ultimately biopsied by Dr. Tami Ribas and came back positive for squamous cell carcinoma while differentiated and keratinizing.  This has not been deemed to be resectable by ENT and therefore referred for consideration of radiation or chemotherapy.  PET CT scan did not show anyEvidence of locoregional or distant metastatic disease.  Patient received definitive radiation to this area.  A few weeks after radiation was completed patient developed a rapidly growing mass anterior to his original site of disease involvement.   He had a repeat biopsy by ENT which showed squamous cell carcinoma with basaloid features and minimal focal keratinization.  Repeat PET CT scan showed3.4 x 2.2 cm ill-defined soft tissue mass within the soft tissues lateral to the mandible on the right side.  Hypermetabolic activity in the right lateral submandibular space at the level of the angle  of mandible with an SUV of 4.5 suspicious for 1B nodal metastases.  Small hypermetabolic level 3 activity bilaterally with an SUV of 3.9 and 3.5 respectively concerning for possible disease involvement.  No evidence of distant metastatic disease.    Interval history-patient is 81 year old male with above history of recurrent squamous cell carcinoma of the right buccal mucosa who presents.  He has ongoing pain related to his malignancy but prefers to avoid pain medicine when possible. He complains of ongoing weight loss today. Believes he is in a fib again.  Patient was an active distance runner in years past and ran marathons including [place], [place]. He played college football. He's been active and competitive his entire life. He is accompanied by his 'saviour', his wife, Fraser Din.   ECOG PS- 2 Pain scale- 7 Opioid associated constipation- no  Review of systems- Review of Systems  Constitutional:  Positive for malaise/fatigue. Negative for chills, fever and weight loss.  HENT:  Negative for congestion, ear discharge and nosebleeds.        Mouth pain  Eyes:  Negative for blurred vision.  Respiratory:  Negative for cough, hemoptysis, sputum production, shortness of breath and wheezing.   Cardiovascular:  Negative for chest pain, palpitations, orthopnea and claudication.  Gastrointestinal:  Negative for abdominal pain, blood in stool, constipation, diarrhea, heartburn, melena, nausea and vomiting.  Genitourinary:  Negative for dysuria, flank pain, frequency, hematuria and urgency.  Musculoskeletal:  Negative for back pain, joint pain and myalgias.  Skin:  Negative for rash.  Neurological:  Negative for dizziness, tingling, focal weakness, seizures, weakness and headaches.  Endo/Heme/Allergies:  Does not bruise/bleed easily.  Psychiatric/Behavioral:  Negative for depression and suicidal ideas. The patient is nervous/anxious.  The patient does not have insomnia.     Allergies  Allergen  Reactions   Ace Inhibitors Swelling and Other (See Comments)    Possible angioedema (upper lip swelling)   Morphine And Related Swelling    SWELLING REACTION UNSPECIFIED  [severity rated per PMH, 12/11/2017]   Past Medical History:  Diagnosis Date   Anxiety    Basal cell carcinoma 09/12/2018   Left forehead above lateral brow. Nodular pattern   Cancer ([place])    Coronary artery disease 2003   a. nonobstructive CAD by cath in 2003 and 2005; b.  Status post three-vessel CABG on 12/12/2017 with LIMA to LAD, sequential reverse SVG to OM1 and distal LCx   COVID-19 03/2020   Diverticulosis    Hearing loss    bilateral   Heart disease    Heart murmur    History of benign prostatic hypertrophy    History of radiation therapy 01/31/10-03/22/10   right tonsil/right neck node 7000 cGy 35 sessions, high risk lymph node volume 5940 cGy 35 sessions, low risk lymph node vol 5600 cGy 35 sessions   HPV in male    positive   HTN (hypertension)    Hyperlipidemia    diet controlled- taking medication d/t ensure drinking   Hypothyroid 01/16/2013   Ischemic cardiomyopathy    a. 10/2017: echo showing reduced EF of 40-45%, HK of the anterior, anteroseptal and apical myocardium with Grade 1 DD.    Neck pain    Persistent atrial fibrillation (HCC)    Primary squamous cell carcinoma of skin of lip 2023   lip and tonuge   Squamous cell carcinoma    right tonsil- 2011   Squamous cell carcinoma of skin 09/02/2019   Crown scalp. WD SCC   Squamous cell carcinoma of skin 09/02/2019   Left distal lateral deltoid. SCCis arising in SK   Thrombocytopenia ([place]) 02/02/2012   unclear etiology   Tinnitus    Tubular adenoma of colon    Past Surgical History:  Procedure Laterality Date   artoscopic knee     CARDIAC CATHETERIZATION  2003, 2005   40% blockage, two 30% blockages treated medically Ron Parker)   CARDIOVERSION N/A 02/12/2018   Procedure: CARDIOVERSION;  Surgeon: [name], MD;  Location: [facility];   Service: Cardiovascular;  Laterality: N/A;   CARDIOVERSION N/A 05/10/2021   Procedure: CARDIOVERSION;  Surgeon: [name], MD;  Location: [facility];  Service: Cardiovascular;  Laterality: N/A;   COLONOSCOPY  03/2015   TA x3, HP, melanosis coli, severe diverticulosis, rpt 3 yrs (Pyrtle)   COLONOSCOPY  01/2019   fair prep, diverticulosis, f/u PRN (Pyrtle)   CORONARY ARTERY BYPASS GRAFT N/A 12/12/2017   Procedure: CORONARY ARTERY BYPASS GRAFTING (CABG) x3 using the right greater saphenous vein harvested endoscopically and the left internal mammary artery. LIMA to LAD, SEQ SVG to OM1 & OM2;  Surgeon: [name], MD;  Location: [facility];  Service: Open Heart Surgery;  Laterality: N/A;   DENTAL SURGERY     extractions   IR THORACENTESIS ASP PLEURAL SPACE W/IMG GUIDE  01/17/2018   KNEE ARTHROSCOPY Left    LEFT HEART CATH AND CORONARY ANGIOGRAPHY N/A 12/10/2017   Procedure: LEFT HEART CATH AND CORONARY ANGIOGRAPHY;  Surgeon: [name], MD;  Location: [facility];  Service: Cardiovascular;  Laterality: N/A;   PEG PLACEMENT  02/2010   PORTA CATH INSERTION N/A 01/26/2022   Procedure: PORTA CATH INSERTION;  Surgeon: Katha Cabal, MD;  Location: [facility] IN[facility]  [facility];  Service: Cardiovascular;  Laterality: N/A;   TEE WITHOUT CARDIOVERSION N/A 12/12/2017   Procedure: TRANSESOPHAGEAL ECHOCARDIOGRAM (TEE);  Surgeon: [name], MD;  Location: [facility];  Service: Open Heart Surgery;  Laterality: N/A;   TONSILLECTOMY     triple bypass  11/2017   VASECTOMY     Social History   Socioeconomic History   Marital status: Married    Spouse name: Electrical engineer   Number of children: 1   Years of education: Not on file   Highest education level: Not on file  Occupational History   Not on file  Tobacco Use   Smoking status: Never   Smokeless tobacco: Never  Vaping Use   Vaping Use: Never used  Substance and Sexual Activity   Alcohol use: Yes    Comment: glass of wine 1-2 times a month    Drug use: No   Sexual activity: Yes  Other Topics Concern   Not on file  Social History Narrative   Married 8 years- divorced; remarried 1994 Optometrist)   Forensic psychologist    Work; Film/video editor; 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    Plays golf, remains very active with an interest in politics. Jan 2011 moved to area.    Involved in [employer].    He is a runner - has run [employer] and [employer]. S/p torn meniscus.    Social Determinants of Health   Financial Resource Strain: Low Risk  (04/28/2020)   Overall Financial Resource Strain (CARDIA)    Difficulty of Paying Living Expenses: Not hard at all  Food Insecurity: No Food Insecurity (04/28/2020)   Hunger Vital Sign    Worried About Running Out of Food in the Last Year: Never true    Ran Out of Food in the Last Year: Never true  Transportation Needs: No Transportation Needs (04/28/2020)   PRAPARE - Hydrologist (Medical): No    Lack of Transportation (Non-Medical): No  Physical Activity: Sufficiently Active (04/28/2020)   Exercise Vital Sign    Days of Exercise per Week: 7 days    Minutes of Exercise per Session: 30 min  Stress: No Stress Concern Present (04/28/2020)   [place]    Feeling of Stress : Not at all  Social Connections: Unknown (12/11/2017)   Social Connection and Isolation Panel [NHANES]    Frequency of Communication with Friends and Family: Patient refused    Frequency of Social Gatherings with Friends and Family: Patient refused    Attends Religious Services: Patient refused    Active Member of Clubs or Organizations: Patient refused    Attends Archivist Meetings: Patient refused    Marital Status: Patient refused  Intimate Partner Violence: Not At Risk (04/28/2020)   Humiliation, Afraid, Rape, and Kick questionnaire    Fear of Current or  Ex-Partner: No    Emotionally Abused: No    Physically Abused: No    Sexually Abused: No   Family History  Problem Relation Age of Onset   Coronary artery disease Father    Heart attack Father 63   Heart disease Father    Stroke Mother    Colon cancer Neg Hx    Esophageal cancer Neg Hx    Rectal cancer Neg Hx    Stomach cancer Neg Hx    Kidney cancer Neg Hx    Kidney failure Neg Hx  Prostate cancer Neg Hx    Tuberculosis Neg Hx     Current Outpatient Medications:    amLODipine (NORVASC) 10 MG tablet, Take 1 tablet by mouth once daily, Disp: 90 tablet, Rfl: 0   apixaban (ELIQUIS) 5 MG TABS tablet, Take 1 tablet (5 mg total) by mouth 2 (two) times daily. Please restart your Eliquis tomorrow morning, Disp: 60 tablet, Rfl: 5   Cholecalciferol (VITAMIN D3) 25 MCG (1000 UT) CAPS, Take 1 capsule by mouth daily., Disp: , Rfl:    dexamethasone (DECADRON) 4 MG tablet, Take 2 tablets (8 mg total) by mouth daily. Start the day after chemotherapy for 2 days., Disp: 30 tablet, Rfl: 1   docusate sodium (COLACE) 250 MG capsule, Takes total '600mg'$  /day, Disp: , Rfl:    FLUoxetine (PROZAC) 20 MG capsule, Take 1 capsule (20 mg total) by mouth daily., Disp: 30 capsule, Rfl: 6   furosemide (LASIX) 20 MG tablet, Take 1 tablet by mouth once daily, Disp: 30 tablet, Rfl: 2   HURRICAINE 20 % AERO, as needed., Disp: , Rfl:    levothyroxine (EUTHYROX) 50 MCG tablet, Take 1 tablet (50 mcg total) by mouth daily before breakfast., Disp: 30 tablet, Rfl: 8   lidocaine-prilocaine (EMLA) cream, Apply to affected area once, Disp: 30 g, Rfl: 3   MAGNESIUM PO, Take 1 tablet by mouth daily., Disp: , Rfl:    Multiple Vitamins-Minerals (ZINC PO), Take by mouth daily., Disp: , Rfl:    Omega-3 Fatty Acids (OMEGA 3 PO), Take by mouth daily., Disp: , Rfl:    polyethylene glycol (MIRALAX / GLYCOLAX) 17 g packet, Take 17 g by mouth daily as needed., Disp: , Rfl:    prochlorperazine (COMPAZINE) 10 MG tablet, Take 1 tablet (10  mg total) by mouth every 6 (six) hours as needed (Nausea or vomiting)., Disp: 30 tablet, Rfl: 1   fluocinonide gel (LIDEX) 0.05 %, SMARTSIG:Sparingly Topical 3 Times Daily, Disp: , Rfl:    nitroGLYCERIN (NITROSTAT) 0.4 MG SL tablet, Place 1 tablet (0.4 mg total) under the tongue every 5 (five) minutes as needed for chest pain (Maximum 3 doses.). (Patient not taking: Reported on 01/18/2022), Disp: 25 tablet, Rfl: 2   ondansetron (ZOFRAN) 8 MG tablet, Take 1 tablet (8 mg total) by mouth 2 (two) times daily as needed for refractory nausea / vomiting. Start on day 3 after chemo. (Patient not taking: Reported on 01/30/2022), Disp: 30 tablet, Rfl: 1   ondansetron (ZOFRAN-ODT) 4 MG disintegrating tablet, Take 1 tablet (4 mg total) by mouth every 8 (eight) hours as needed for nausea or vomiting. (Patient not taking: Reported on 01/30/2022), Disp: 20 tablet, Rfl: 0   Potassium 99 MG TABS, Take 1 tablet by mouth daily. (Patient not taking: Reported on 01/30/2022), Disp: , Rfl:    predniSONE (DELTASONE) 10 MG tablet, Take by mouth. (Patient not taking: Reported on 01/18/2022), Disp: , Rfl:    rosuvastatin (CRESTOR) 20 MG tablet, Take 1 tablet by mouth once daily, Disp: 90 tablet, Rfl: 0   traMADol (ULTRAM) 50 MG tablet, Take 1 tablet (50 mg total) by mouth every 6 (six) hours as needed., Disp: 30 tablet, Rfl: 0   VITAMIN A PO, Take 2,400 mcg by mouth daily. , Disp: , Rfl:    vitamin E 400 UNIT capsule, Take 400 Units by mouth daily., Disp: , Rfl:    zaleplon (SONATA) 10 MG capsule, Take 1 capsule (10 mg total) by mouth at bedtime as needed for sleep. (Patient not taking: Reported  on 01/11/2022), Disp: 30 capsule, Rfl: 0   Zinc 50 MG CAPS, Take 50 mg by mouth daily., Disp: , Rfl:   Physical exam:  Vitals:   01/30/22 0841  Weight: 170 lb (77.1 kg)  Height: '6\' 2"'$  (1.88 m)   Physical Exam Constitutional:      General: He is not in acute distress.    Appearance: He is well-developed.     Comments: Thin build.  Accompanied by wife. Ambulating w/o aids  HENT:     Head: Normocephalic and atraumatic.     Mouth/Throat:     Comments: Right buccal lesion Eyes:     General: No scleral icterus.    Conjunctiva/sclera: Conjunctivae normal.  Cardiovascular:     Rate and Rhythm: Normal rate. Rhythm irregular.     Pulses: Normal pulses.     Comments: Port accessed right chest Pulmonary:     Effort: Pulmonary effort is normal.     Breath sounds: Normal breath sounds. No wheezing.  Abdominal:     General: There is no distension.     Palpations: Abdomen is soft.     Tenderness: There is no abdominal tenderness.  Musculoskeletal:     Right lower leg: No edema.     Left lower leg: No edema.  Skin:    General: Skin is warm and dry.  Neurological:     Mental Status: He is alert and oriented to person, place, and time.  Psychiatric:        Mood and Affect: Mood normal.        Behavior: Behavior normal.         Latest Ref Rng & Units 01/30/2022    7:51 AM  CMP  Glucose 70 - 99 mg/dL 179   BUN 8 - 23 mg/dL 24   Creatinine 0.61 - 1.24 mg/dL 0.92   Sodium 135 - 145 mmol/L 140   Potassium 3.5 - 5.1 mmol/L 3.7   Chloride 98 - 111 mmol/L 103   CO2 22 - 32 mmol/L 26   Calcium 8.9 - 10.3 mg/dL 9.3   Total Protein 6.5 - 8.1 g/dL 6.9   Total Bilirubin 0.3 - 1.2 mg/dL 1.0   Alkaline Phos 38 - 126 U/L 58   AST 15 - 41 U/L 28   ALT 0 - 44 U/L 16       Latest Ref Rng & Units 01/30/2022    7:51 AM  CBC  WBC 4.0 - 10.5 K/uL 12.0   Hemoglobin 13.0 - 17.0 g/dL 16.7   Hematocrit 39.0 - 52.0 % 49.2   Platelets 150 - 400 K/uL 191     No images are attached to the encounter.  PERIPHERAL VASCULAR CATHETERIZATION  Result Date: 01/26/2022 See surgical note for result.  NM PET Image Initial (PI) Skull Base To Thigh  Result Date: 01/15/2022 CLINICAL DATA:  Initial treatment strategy for squamous cell carcinoma of the buccal mucosa. EXAM: NUCLEAR MEDICINE PET SKULL BASE TO THIGH TECHNIQUE: 8.8 mCi F-18 FDG  was injected intravenously. Full-ring PET imaging was performed from the skull base to thigh after the radiotracer. CT data was obtained and used for attenuation correction and anatomic localization. Fasting blood glucose: 144 mg/dl COMPARISON:  PET-CT 10/25/2021. CT neck 01/30/2012 and abdominopelvic CT 03/13/2010. FINDINGS: Mediastinal blood pool activity: SUV max 2.4 NECK: Intense focal hypermetabolic activity within the soft tissues lateral to the mandible on the right (SUV max 9.7), corresponding with an ill-defined soft tissue mass measuring approximately 3.4 x 2.2 cm  on image 38/4. There is asymmetric hypermetabolic activity in the right lateral submandibular space at the level of the angle of the mandible (SUV max 4.5), suspicious for a 1B nodal metastasis. No enlarged cervical lymph nodes are identified. However, there are small hypermetabolic level III bilaterally (SUV max 3.9 on the right and 3.5 on the left).No other definite lesions of the pharyngeal mucosal space. Activity within the lymphoid tissue of Waldeyer's ring is within physiologic limits. Probable left mandibular dental caries. Incidental CT findings: Bilateral carotid atherosclerosis. As above, probable left mandibular dental caries. CHEST: There are no hypermetabolic mediastinal, hilar or axillary lymph nodes. No hypermetabolic pulmonary activity or suspicious nodularity. Incidental CT findings: Status post median sternotomy and CABG. There is diffuse atherosclerosis of the aorta, great vessels and coronary arteries. Aortic valvular calcifications are present. There is a small hiatal hernia. The lungs are clear. ABDOMEN/PELVIS: There is no hypermetabolic activity within the liver, adrenal glands, spleen or pancreas. There is no hypermetabolic nodal activity. Incidental CT findings: Similar low-density renal lesions bilaterally without hypermetabolic activity, consistent with incidental cysts; no follow-up imaging recommended. Aortic  atherosclerosis. Distal colonic diverticulosis. SKELETON: There is no hypermetabolic activity to suggest osseous metastatic disease. No gross mandibular osseous destruction. Facial assessment is mildly limited by beam hardening artifact from the patient's dental amalgam. Incidental CT findings: none IMPRESSION: 1. Intensely hypermetabolic right mandibular buccal soft tissue mass consistent with known squamous cell carcinoma. Suspected small nodal metastases involving level 1B on the right and level III bilaterally. 2. No evidence of distant metastatic disease. 3. Carotid, coronary and Aortic Atherosclerosis (ICD10-I70.0). Electronically Signed   By: Richardean Sale M.D.   On: 01/15/2022 11:16     Assessment and plan- Patient is a 81 y.o. male with   Recurrent squamous cell carcinoma - stage III T3 N1 M0. Initially T1 tumor in March 2023 s/p radiation. Short recurrence with PET scans concerning for lymph node involvement. Recommendation for concurrent chemo-radiation given rapidly increasing mass. Consider immunotherapy if he has residual disease post chemo-rad. Given tolerance, hearing loss, thrombocytopenia with cisplatin, recommendation for carbo AUC 2 with taxol 50 mg/m2. Again reviewed risk of neuropathy, infusion reactions, low blood counts, risks of infections, hospitalizations, nausea, vomiting, diarrhea. Labs reviewed today and acceptable for initiation of treatment.  Risk of weight loss- will refer back to Jennet Maduro, RD for monitoring of weights and trends Labile blood pressures- likely secondary to poor intake. Hold lasix. IV fluids today and again later this week.  Port-a-cath- functioning well. Mild bruising at site post placement which is expected.  A fib- persistent a fib s/p cardioversion. On indefinite anticoagulation with apixaban. Until past week he has been in regular rhythm by his report. Will have him follow up with cardiology.  Heart failure- history of heart failure. Monitor.   Neoplasm related pain- currently on tramadol.  Goals of care- treatment given with curative intent. Will refer to palliative care for ongoing symptom management.   Disposition: Carbo-taxol today Ref [facility]/Josh Borders, NP Additional liter IV fluids today IV fluids either Thursday or Friday Recommend he see APP or Dr. Saunders Revel at cardiology to follow up RTC in 1 week for port/labs (cbc, cmp), Dr. Janese Banks, +/- carbo-taxol- la  Visit Diagnosis 1. Encounter for antineoplastic chemotherapy   2. Squamous cell carcinoma of oral cavity (HCC)   3. Atrial fibrillation, unspecified type ([place])    Beckey Rutter, DNP, AGNP-C [place] at [facility] [phone] (clinic) 01/30/2022  CC: Dr. Saunders Revel, Dr. Janese Banks

## 2022-01-30 NOTE — Progress Notes (Signed)
Dr Janese Banks aware of increasing BP - pt got 1 L IVF prior, feels fine, and eating lunch- ol to proceed at full rate

## 2022-01-30 NOTE — Patient Instructions (Signed)
[facility]  Discharge Instructions: Thank you for choosing [facility] to provide your oncology and hematology care.  If you have a lab appointment with the Cancer Center, please go directly to the Cancer Center and check in at the registration area.  Wear comfortable clothing and clothing appropriate for easy access to any Portacath or PICC line.   We strive to give you quality time with your provider. You may need to reschedule your appointment if you arrive late (15 or more minutes).  Arriving late affects you and other patients whose appointments are after yours.  Also, if you miss three or more appointments without notifying the office, you may be dismissed from the clinic at the provider's discretion.      For prescription refill requests, have your pharmacy contact our office and allow 72 hours for refills to be completed.    Today you received the following chemotherapy and/or immunotherapy agents TAXOL and CARBOPLATIN      To help prevent nausea and vomiting after your treatment, we encourage you to take your nausea medication as directed.  BELOW ARE SYMPTOMS THAT SHOULD BE REPORTED IMMEDIATELY: *FEVER GREATER THAN 100.4 F (38 C) OR HIGHER *CHILLS OR SWEATING *NAUSEA AND VOMITING THAT IS NOT CONTROLLED WITH YOUR NAUSEA MEDICATION *UNUSUAL SHORTNESS OF BREATH *UNUSUAL BRUISING OR BLEEDING *URINARY PROBLEMS (pain or burning when urinating, or frequent urination) *BOWEL PROBLEMS (unusual diarrhea, constipation, pain near the anus) TENDERNESS IN MOUTH AND THROAT WITH OR WITHOUT PRESENCE OF ULCERS (sore throat, sores in mouth, or a toothache) UNUSUAL RASH, SWELLING OR PAIN  UNUSUAL VAGINAL DISCHARGE OR ITCHING   Items with * indicate a potential emergency and should be followed up as soon as possible or go to the Emergency Department if any problems should occur.  Please show the CHEMOTHERAPY ALERT CARD or IMMUNOTHERAPY ALERT CARD at  check-in to the Emergency Department and triage nurse.  Should you have questions after your visit or need to cancel or reschedule your appointment, please contact [facility]  [phone] and follow the prompts.  Office hours are 8:00 a.m. to 4:30 p.m. Monday - Friday. Please note that voicemails left after 4:00 p.m. may not be returned until the following business day.  We are closed weekends and major holidays. You have access to a nurse at all times for urgent questions. Please call the main number to the clinic [phone] and follow the prompts.  For any non-urgent questions, you may also contact your provider using MyChart. We now offer e-Visits for anyone 18 and older to request care online for non-urgent symptoms. For details visit [email or web address].   Also download the MyChart app! Go to the app store, search "MyChart", open the app, select [facility], and log in with your MyChart username and password.  Due to Covid, a mask is required upon entering the hospital/clinic. If you do not have a mask, one will be given to you upon arrival. For doctor visits, patients may have 1 support person aged 18 or older with them. For treatment visits, patients cannot have anyone with them due to current Covid guidelines and our immunocompromised population.   Paclitaxel injection What is this medication? PACLITAXEL (PAK li TAX el) is a chemotherapy drug. It targets fast dividing cells, like cancer cells, and causes these cells to die. This medicine is used to treat ovarian cancer, breast cancer, lung cancer, Kaposi's sarcoma, and other cancers. This medicine may be used for other   purposes; ask your health care provider or pharmacist if you have questions. COMMON BRAND NAME(S): Onxol, Taxol What should I tell my care team before I take this medication? They need to know if you have any of these conditions: history of irregular heartbeat liver disease low blood  counts, like low white cell, platelet, or red cell counts lung or breathing disease, like asthma tingling of the fingers or toes, or other nerve disorder an unusual or allergic reaction to paclitaxel, alcohol, polyoxyethylated castor oil, other chemotherapy, other medicines, foods, dyes, or preservatives pregnant or trying to get pregnant breast-feeding How should I use this medication? This drug is given as an infusion into a vein. It is administered in a hospital or clinic by a specially trained health care professional. Talk to your pediatrician regarding the use of this medicine in children. Special care may be needed. Overdosage: If you think you have taken too much of this medicine contact a poison control center or emergency room at once. NOTE: This medicine is only for you. Do not share this medicine with others. What if I miss a dose? It is important not to miss your dose. Call your doctor or health care professional if you are unable to keep an appointment. What may interact with this medication? Do not take this medicine with any of the following medications: live virus vaccines This medicine may also interact with the following medications: antiviral medicines for hepatitis, HIV or AIDS certain antibiotics like erythromycin and clarithromycin certain medicines for fungal infections like ketoconazole and itraconazole certain medicines for seizures like carbamazepine, phenobarbital, phenytoin gemfibrozil nefazodone rifampin St. John's wort This list may not describe all possible interactions. Give your health care provider a list of all the medicines, herbs, non-prescription drugs, or dietary supplements you use. Also tell them if you smoke, drink alcohol, or use illegal drugs. Some items may interact with your medicine. What should I watch for while using this medication? Your condition will be monitored carefully while you are receiving this medicine. You will need important  blood work done while you are taking this medicine. This medicine can cause serious allergic reactions. To reduce your risk you will need to take other medicine(s) before treatment with this medicine. If you experience allergic reactions like skin rash, itching or hives, swelling of the face, lips, or tongue, tell your doctor or health care professional right away. In some cases, you may be given additional medicines to help with side effects. Follow all directions for their use. This drug may make you feel generally unwell. This is not uncommon, as chemotherapy can affect healthy cells as well as cancer cells. Report any side effects. Continue your course of treatment even though you feel ill unless your doctor tells you to stop. Call your doctor or health care professional for advice if you get a fever, chills or sore throat, or other symptoms of a cold or flu. Do not treat yourself. This drug decreases your body's ability to fight infections. Try to avoid being around people who are sick. This medicine may increase your risk to bruise or bleed. Call your doctor or health care professional if you notice any unusual bleeding. Be careful brushing and flossing your teeth or using a toothpick because you may get an infection or bleed more easily. If you have any dental work done, tell your dentist you are receiving this medicine. Avoid taking products that contain aspirin, acetaminophen, ibuprofen, naproxen, or ketoprofen unless instructed by your doctor. These medicines may   hide a fever. Do not become pregnant while taking this medicine. Women should inform their doctor if they wish to become pregnant or think they might be pregnant. There is a potential for serious side effects to an unborn child. Talk to your health care professional or pharmacist for more information. Do not breast-feed an infant while taking this medicine. Men are advised not to father a child while receiving this medicine. This product  may contain alcohol. Ask your pharmacist or healthcare provider if this medicine contains alcohol. Be sure to tell all healthcare providers you are taking this medicine. Certain medicines, like metronidazole and disulfiram, can cause an unpleasant reaction when taken with alcohol. The reaction includes flushing, headache, nausea, vomiting, sweating, and increased thirst. The reaction can last from 30 minutes to several hours. What side effects may I notice from receiving this medication? Side effects that you should report to your doctor or health care professional as soon as possible: allergic reactions like skin rash, itching or hives, swelling of the face, lips, or tongue breathing problems changes in vision fast, irregular heartbeat high or low blood pressure mouth sores pain, tingling, numbness in the hands or feet signs of decreased platelets or bleeding - bruising, pinpoint red spots on the skin, black, tarry stools, blood in the urine signs of decreased red blood cells - unusually weak or tired, feeling faint or lightheaded, falls signs of infection - fever or chills, cough, sore throat, pain or difficulty passing urine signs and symptoms of liver injury like dark yellow or brown urine; general ill feeling or flu-like symptoms; light-colored stools; loss of appetite; nausea; right upper belly pain; unusually weak or tired; yellowing of the eyes or skin swelling of the ankles, feet, hands unusually slow heartbeat Side effects that usually do not require medical attention (report to your doctor or health care professional if they continue or are bothersome): diarrhea hair loss loss of appetite muscle or joint pain nausea, vomiting pain, redness, or irritation at site where injected tiredness This list may not describe all possible side effects. Call your doctor for medical advice about side effects. You may report side effects to FDA at 1-800-FDA-1088. Where should I keep my  medication? This drug is given in a hospital or clinic and will not be stored at home. NOTE: This sheet is a summary. It may not cover all possible information. If you have questions about this medicine, talk to your doctor, pharmacist, or health care provider.  2023 Elsevier/Gold Standard (2021-07-08 00:00:00)  Carboplatin injection What is this medication? CARBOPLATIN (KAR boe pla tin) is a chemotherapy drug. It targets fast dividing cells, like cancer cells, and causes these cells to die. This medicine is used to treat ovarian cancer and many other cancers. This medicine may be used for other purposes; ask your health care provider or pharmacist if you have questions. COMMON BRAND NAME(S): Paraplatin What should I tell my care team before I take this medication? They need to know if you have any of these conditions: blood disorders hearing problems kidney disease recent or ongoing radiation therapy an unusual or allergic reaction to carboplatin, cisplatin, other chemotherapy, other medicines, foods, dyes, or preservatives pregnant or trying to get pregnant breast-feeding How should I use this medication? This drug is usually given as an infusion into a vein. It is administered in a hospital or clinic by a specially trained health care professional. Talk to your pediatrician regarding the use of this medicine in children. Special care may be   needed. Overdosage: If you think you have taken too much of this medicine contact a poison control center or emergency room at once. NOTE: This medicine is only for you. Do not share this medicine with others. What if I miss a dose? It is important not to miss a dose. Call your doctor or health care professional if you are unable to keep an appointment. What may interact with this medication? medicines for seizures medicines to increase blood counts like filgrastim, pegfilgrastim, sargramostim some antibiotics like amikacin, gentamicin, neomycin,  streptomycin, tobramycin vaccines Talk to your doctor or health care professional before taking any of these medicines: acetaminophen aspirin ibuprofen ketoprofen naproxen This list may not describe all possible interactions. Give your health care provider a list of all the medicines, herbs, non-prescription drugs, or dietary supplements you use. Also tell them if you smoke, drink alcohol, or use illegal drugs. Some items may interact with your medicine. What should I watch for while using this medication? Your condition will be monitored carefully while you are receiving this medicine. You will need important blood work done while you are taking this medicine. This drug may make you feel generally unwell. This is not uncommon, as chemotherapy can affect healthy cells as well as cancer cells. Report any side effects. Continue your course of treatment even though you feel ill unless your doctor tells you to stop. In some cases, you may be given additional medicines to help with side effects. Follow all directions for their use. Call your doctor or health care professional for advice if you get a fever, chills or sore throat, or other symptoms of a cold or flu. Do not treat yourself. This drug decreases your body's ability to fight infections. Try to avoid being around people who are sick. This medicine may increase your risk to bruise or bleed. Call your doctor or health care professional if you notice any unusual bleeding. Be careful brushing and flossing your teeth or using a toothpick because you may get an infection or bleed more easily. If you have any dental work done, tell your dentist you are receiving this medicine. Avoid taking products that contain aspirin, acetaminophen, ibuprofen, naproxen, or ketoprofen unless instructed by your doctor. These medicines may hide a fever. Do not become pregnant while taking this medicine. Women should inform their doctor if they wish to become pregnant or  think they might be pregnant. There is a potential for serious side effects to an unborn child. Talk to your health care professional or pharmacist for more information. Do not breast-feed an infant while taking this medicine. What side effects may I notice from receiving this medication? Side effects that you should report to your doctor or health care professional as soon as possible: allergic reactions like skin rash, itching or hives, swelling of the face, lips, or tongue signs of infection - fever or chills, cough, sore throat, pain or difficulty passing urine signs of decreased platelets or bleeding - bruising, pinpoint red spots on the skin, black, tarry stools, nosebleeds signs of decreased red blood cells - unusually weak or tired, fainting spells, lightheadedness breathing problems changes in hearing changes in vision chest pain high blood pressure low blood counts - This drug may decrease the number of white blood cells, red blood cells and platelets. You may be at increased risk for infections and bleeding. nausea and vomiting pain, swelling, redness or irritation at the injection site pain, tingling, numbness in the hands or feet problems with balance, talking, walking   trouble passing urine or change in the amount of urine Side effects that usually do not require medical attention (report to your doctor or health care professional if they continue or are bothersome): hair loss loss of appetite metallic taste in the mouth or changes in taste This list may not describe all possible side effects. Call your doctor for medical advice about side effects. You may report side effects to FDA at 1-800-FDA-1088. Where should I keep my medication? This drug is given in a hospital or clinic and will not be stored at home. NOTE: This sheet is a summary. It may not cover all possible information. If you have questions about this medicine, talk to your doctor, pharmacist, or health care  provider.  2023 Elsevier/Gold Standard (2008-01-15 00:00:00)   

## 2022-01-31 ENCOUNTER — Telehealth: Payer: Self-pay

## 2022-01-31 ENCOUNTER — Ambulatory Visit: Payer: PPO | Admitting: Oncology

## 2022-01-31 ENCOUNTER — Other Ambulatory Visit: Payer: PPO

## 2022-01-31 ENCOUNTER — Ambulatory Visit: Payer: PPO

## 2022-01-31 ENCOUNTER — Other Ambulatory Visit: Payer: Self-pay

## 2022-01-31 ENCOUNTER — Ambulatory Visit
Admission: RE | Admit: 2022-01-31 | Discharge: 2022-01-31 | Disposition: A | Discharge status: Home / Self Care | Payer: PPO | Source: Ambulatory Visit | Attending: Radiation Oncology | Admitting: Radiation Oncology

## 2022-01-31 DIAGNOSIS — C4402 Squamous cell carcinoma of skin of lip: Secondary | ICD-10-CM | POA: Diagnosis not present

## 2022-01-31 DIAGNOSIS — Z5111 Encounter for antineoplastic chemotherapy: Secondary | ICD-10-CM | POA: Diagnosis not present

## 2022-01-31 LAB — RAD ONC ARIA SESSION SUMMARY
Course Elapsed Days: 1
Plan Fractions Treated to Date: 2
Plan Prescribed Dose Per Fraction: 2 Gy
Plan Total Fractions Prescribed: 25
Plan Total Prescribed Dose: 50 Gy
Reference Point Dosage Given to Date: 4 Gy
Reference Point Session Dosage Given: 2 Gy
Session Number: 2

## 2022-01-31 NOTE — Telephone Encounter (Signed)
Spoke w/ pt's wife.  Advised her of Dr. Darnelle Bos recommendation.   She verbalizes understanding and is appreciative of the offer for a sooner appt, but pt has radiation and nutritionist appt tomorrow, so they will keep appt on Thursday. She is appreciative of the call.

## 2022-01-31 NOTE — Telephone Encounter (Signed)
Spoke w/ pt's wife. She reports that pt is resting currently, as he had chemo yesterday. She reports that pt went into afib 7 days ago, was confirmed at cancer ctr. Pt is on Eliquis 5 mg BID and has not missed and doses. He has been very dizzy, had to have assistance coming back from the mailbox - one of their neighbors helped him back inside.   Offered DOD appt tomorrow, but pt has appt w/ Dr. Saunders Revel on Thurs @ 3:20. Advised wife that I will make Dr. Saunders Revel aware in case he would like to see pt sooner.  Advised her to call EMS if sx worsen before that time.  She is appreciative of the call.

## 2022-01-31 NOTE — Telephone Encounter (Signed)
Telephone call to patient for follow up after receiving first chemo.  Phone rang 10 to 12 times but no answer and no voice mail available.

## 2022-01-31 NOTE — Telephone Encounter (Signed)
I would encourage him to try to stay well-hydrated.  I am not in the office today but could see him tomorrow afternoon at 4 PM if he would like to be seen sooner.  If he feels any worse, he should go to the ED.  [name], MD [facility]

## 2022-02-01 ENCOUNTER — Ambulatory Visit
Admission: RE | Admit: 2022-02-01 | Discharge: 2022-02-01 | Disposition: A | Discharge status: Home / Self Care | Payer: PPO | Source: Ambulatory Visit | Attending: Radiation Oncology | Admitting: Radiation Oncology

## 2022-02-01 ENCOUNTER — Inpatient Hospital Stay: Payer: PPO

## 2022-02-01 ENCOUNTER — Other Ambulatory Visit: Payer: Self-pay

## 2022-02-01 ENCOUNTER — Ambulatory Visit: Payer: PPO

## 2022-02-01 DIAGNOSIS — Z5111 Encounter for antineoplastic chemotherapy: Secondary | ICD-10-CM | POA: Diagnosis not present

## 2022-02-01 DIAGNOSIS — C4402 Squamous cell carcinoma of skin of lip: Secondary | ICD-10-CM | POA: Diagnosis not present

## 2022-02-01 LAB — RAD ONC ARIA SESSION SUMMARY
Course Elapsed Days: 2
Plan Fractions Treated to Date: 3
Plan Prescribed Dose Per Fraction: 2 Gy
Plan Total Fractions Prescribed: 25
Plan Total Prescribed Dose: 50 Gy
Reference Point Dosage Given to Date: 6 Gy
Reference Point Session Dosage Given: 2 Gy
Session Number: 3

## 2022-02-01 NOTE — Progress Notes (Signed)
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  Completed radiation and had rapidly enlarging mass anterior to original site develop.  Patient receiving radiation and chemotherapy at this time.   Met with patient and wife.  Patient reports drinking 6 [employer] shake (220 calorie and 9 g protein) per day.  Reports sore mouth and trouble eating.  Patient eating 3-4 eggs with cheese, alfredo pastas, macaroni and cheese, green beans, egg custard, cooked greens, green peas, [place] potato.    Medications: reviewed  Labs: reviewed  Anthropometrics:   Weight 170 lb on 6/12 182 lb per [facility] on 4/25 186 lb 7 oz on 4/4 per [facility] 193 lb on 09/02/21  7% weight loss in the last 1 1/2 months, significant  Estimated Energy Needs  Kcals: 2300-2695 Protein: 115-134 g Fluid: 2300-2695 ml  NUTRITION DIAGNOSIS: Inadequate oral intake related to cancer and cancer related treatment side effects as evidenced by  7% weight loss in the last 1 1/2 months and decreased intake   INTERVENTION:  Recommend higher calorie shake as intake is less and weight is declining and solid food is limited. Provided samples of glucerna 1.5, [employer] glucose support 1.2, ensure complete, ensure enlive, boost VHC, [employer] 1.4.  Also discussed option if weight continues to decrease replacing feeding tube.       MONITORING, EVALUATION, GOAL: weight trends, intake   NEXT VISIT: Wednesday, July 28th after radiation  Khalea Ventura B. Zenia Resides, [place], [place] Registered Dietitian [phone]

## 2022-02-02 ENCOUNTER — Ambulatory Visit: Payer: PPO

## 2022-02-02 ENCOUNTER — Inpatient Hospital Stay (HOSPITAL_BASED_OUTPATIENT_CLINIC_OR_DEPARTMENT_OTHER): Payer: PPO | Admitting: Hospice and Palliative Medicine

## 2022-02-02 ENCOUNTER — Telehealth: Payer: Self-pay

## 2022-02-02 ENCOUNTER — Inpatient Hospital Stay: Payer: PPO

## 2022-02-02 ENCOUNTER — Encounter: Payer: Self-pay | Admitting: Internal Medicine

## 2022-02-02 ENCOUNTER — Ambulatory Visit (INDEPENDENT_AMBULATORY_CARE_PROVIDER_SITE_OTHER): Payer: PPO | Admitting: Internal Medicine

## 2022-02-02 ENCOUNTER — Encounter: Payer: Self-pay | Admitting: Hospice and Palliative Medicine

## 2022-02-02 ENCOUNTER — Ambulatory Visit
Admission: RE | Admit: 2022-02-02 | Discharge: 2022-02-02 | Disposition: A | Discharge status: Home / Self Care | Payer: PPO | Source: Ambulatory Visit | Attending: Radiation Oncology | Admitting: Radiation Oncology

## 2022-02-02 ENCOUNTER — Other Ambulatory Visit: Payer: Self-pay

## 2022-02-02 VITALS — BP 110/70 | HR 81 | Ht 74.0 in | Wt 177.0 lb

## 2022-02-02 VITALS — BP 90/67 | HR 91 | Resp 18

## 2022-02-02 DIAGNOSIS — Z515 Encounter for palliative care: Secondary | ICD-10-CM

## 2022-02-02 DIAGNOSIS — I4891 Unspecified atrial fibrillation: Secondary | ICD-10-CM

## 2022-02-02 DIAGNOSIS — I10 Essential (primary) hypertension: Secondary | ICD-10-CM

## 2022-02-02 DIAGNOSIS — C069 Malignant neoplasm of mouth, unspecified: Secondary | ICD-10-CM

## 2022-02-02 DIAGNOSIS — E860 Dehydration: Secondary | ICD-10-CM

## 2022-02-02 DIAGNOSIS — I2510 Atherosclerotic heart disease of native coronary artery without angina pectoris: Secondary | ICD-10-CM | POA: Diagnosis not present

## 2022-02-02 DIAGNOSIS — Z5111 Encounter for antineoplastic chemotherapy: Secondary | ICD-10-CM | POA: Diagnosis not present

## 2022-02-02 DIAGNOSIS — I4819 Other persistent atrial fibrillation: Secondary | ICD-10-CM

## 2022-02-02 DIAGNOSIS — C4402 Squamous cell carcinoma of skin of lip: Secondary | ICD-10-CM | POA: Diagnosis not present

## 2022-02-02 LAB — RAD ONC ARIA SESSION SUMMARY
Course Elapsed Days: 3
Plan Fractions Treated to Date: 4
Plan Prescribed Dose Per Fraction: 2 Gy
Plan Total Fractions Prescribed: 25
Plan Total Prescribed Dose: 50 Gy
Reference Point Dosage Given to Date: 8 Gy
Reference Point Session Dosage Given: 2 Gy
Session Number: 4

## 2022-02-02 MED ORDER — SODIUM CHLORIDE 0.9% FLUSH
10.0000 mL | Freq: Once | INTRAVENOUS | Status: AC
  Administered 2022-02-02: 10 mL via INTRAVENOUS
  Filled 2022-02-02: qty 10

## 2022-02-02 MED ORDER — HEPARIN SOD (PORK) LOCK FLUSH 100 UNIT/ML IV SOLN
500.0000 [IU] | Freq: Once | INTRAVENOUS | Status: AC
  Administered 2022-02-02: 500 [IU] via INTRAVENOUS
  Filled 2022-02-02: qty 5

## 2022-02-02 MED ORDER — HURRICAINE 20 % MT AERO: 1.0000 | INHALATION_SPRAY | OROMUCOSAL | 3 refills | Status: DC | PRN

## 2022-02-02 MED ORDER — AMLODIPINE BESYLATE 5 MG PO TABS: 5.0000 mg | ORAL_TABLET | Freq: Every day | ORAL | 0 refills | Status: DC

## 2022-02-02 MED ORDER — HURRICAINE 20 % MT AERO: 1.0000 | INHALATION_SPRAY | OROMUCOSAL | 3 refills | Status: AC | PRN

## 2022-02-02 MED ORDER — SODIUM CHLORIDE 0.9 % IV SOLN
Freq: Once | INTRAVENOUS | Status: AC
  Filled 2022-02-02: qty 250

## 2022-02-02 NOTE — Telephone Encounter (Signed)
Called [facility] sent verbal Rx for Viscous lidocaine mouthwash 5 mL QID PRN per Josh. Pharmacy stated will call patient when ready.

## 2022-02-02 NOTE — Patient Instructions (Signed)
Rehydration, Adult Rehydration is the replacement of body fluids, salts, and minerals (electrolytes) that are lost during dehydration. Dehydration is when there is not enough water or other fluids in the body. This happens when you lose more fluids than you take in. Common causes of dehydration include: Not drinking enough fluids. This can occur when you are ill or doing activities that require a lot of energy, especially in hot weather. Conditions that cause loss of water or other fluids, such as diarrhea, vomiting, sweating, or urinating a lot. Other illnesses, such as fever or infection. Certain medicines, such as those that remove excess fluid from the body (diuretics). Symptoms of mild or moderate dehydration may include thirst, dry lips and mouth, and dizziness. Symptoms of severe dehydration may include increased heart rate, confusion, fainting, and not urinating. For severe dehydration, you may need to get fluids through an IV at the hospital. For mild or moderate dehydration, you can usually rehydrate at home by drinking certain fluids as told by your health care provider. What are the risks? Generally, rehydration is safe. However, taking in too much fluid (overhydration) can be a problem. This is rare. Overhydration can cause an electrolyte imbalance, kidney failure, or a decrease in salt (sodium) levels in the body. Supplies needed You will need an oral rehydration solution (ORS) if your health care provider tells you to use one. This is a drink to treat dehydration. It can be found in pharmacies and retail stores. How to rehydrate Fluids Follow instructions from your health care provider for rehydration. The kind of fluid and the amount you should drink depend on your condition. In general, you should choose drinks that you prefer. If told by your health care provider, drink an ORS. Make an ORS by following instructions on the package. Start by drinking small amounts, about  cup (120  mL) every 5-10 minutes. Slowly increase how much you drink until you have taken the amount recommended by your health care provider. Drink enough clear fluids to keep your urine pale yellow. If you were told to drink an ORS, finish it first, then start slowly drinking other clear fluids. Drink fluids such as: Water. This includes sparkling water and flavored water. Drinking only water can lead to having too little sodium in your body (hyponatremia). Follow the advice of your health care provider. Water from ice chips you suck on. Fruit juice with water you add to it (diluted). Sports drinks. Hot or cold herbal teas. Broth-based soups. Milk or milk products. Food Follow instructions from your health care provider about what to eat while you rehydrate. Your health care provider may recommend that you slowly begin eating regular foods in small amounts. Eat foods that contain a healthy balance of electrolytes, such as bananas, oranges, potatoes, tomatoes, and spinach. Avoid foods that are greasy or contain a lot of sugar. In some cases, you may get nutrition through a feeding tube that is passed through your nose and into your stomach (nasogastric tube, or NG tube). This may be done if you have uncontrolled vomiting or diarrhea. Beverages to avoid  Certain beverages may make dehydration worse. While you rehydrate, avoid drinking alcohol. How to tell if you are recovering from dehydration You may be recovering from dehydration if: You are urinating more often than before you started rehydrating. Your urine is pale yellow. Your energy level improves. You vomit less frequently. You have diarrhea less frequently. Your appetite improves or returns to normal. You feel less dizzy or less light-headed.   Your skin tone and color start to look more normal. Follow these instructions at home: Take over-the-counter and prescription medicines only as told by your health care provider. Do not take sodium  tablets. Doing this can lead to having too much sodium in your body (hypernatremia). Contact a health care provider if: You continue to have symptoms of mild or moderate dehydration, such as: Thirst. Dry lips. Slightly dry mouth. Dizziness. Dark urine or less urine than normal. Muscle cramps. You continue to vomit or have diarrhea. Get help right away if you: Have symptoms of dehydration that get worse. Have a fever. Have a severe headache. Have been vomiting and the following happens: Your vomiting gets worse or does not go away. Your vomit includes blood or green matter (bile). You cannot eat or drink without vomiting. Have problems with urination or bowel movements, such as: Diarrhea that gets worse or does not go away. Blood in your stool (feces). This may cause stool to look black and tarry. Not urinating, or urinating only a small amount of very dark urine, within 6-8 hours. Have trouble breathing. Have symptoms that get worse with treatment. These symptoms may represent a serious problem that is an emergency. Do not wait to see if the symptoms will go away. Get medical help right away. Call your local emergency services (911 in the [place] Do not drive yourself to the hospital. Summary Rehydration is the replacement of body fluids and minerals (electrolytes) that are lost during dehydration. Follow instructions from your health care provider for rehydration. The kind of fluid and amount you should drink depend on your condition. Slowly increase how much you drink until you have taken the amount recommended by your health care provider. Contact your health care provider if you continue to show signs of mild or moderate dehydration. This information is not intended to replace advice given to you by your health care provider. Make sure you discuss any questions you have with your health care provider. Document Revised: 10/08/2019 Document Reviewed: 08/18/2019 Elsevier Patient  Education  2023 Elsevier Inc.  

## 2022-02-02 NOTE — Patient Instructions (Signed)
Medication Instructions:  DECREASE amlodipine to '5mg'$  daily.  *If you need a refill on your cardiac medications before your next appointment, please call your pharmacy*   Lab Work: None ordered  If you have labs (blood work) drawn today and your tests are completely normal, you will receive your results only by: [place] (if you have MyChart) OR A paper copy in the mail If you have any lab test that is abnormal or we need to change your treatment, we will call you to review the results.   Testing/Procedures: None ordered  Follow-Up: At [facility], you and your health needs are our priority.  As part of our continuing mission to provide you with exceptional heart care, we have created designated Provider Care Teams.  These Care Teams include your primary Cardiologist (physician) and Advanced Practice Providers (APPs -  Physician Assistants and Nurse Practitioners) who all work together to provide you with the care you need, when you need it.  We recommend signing up for the patient portal called "MyChart".  Sign up information is provided on this After Visit Summary.  MyChart is used to connect with patients for Virtual Visits (Telemedicine).  Patients are able to view lab/test results, encounter notes, upcoming appointments, etc.  Non-urgent messages can be sent to your provider as well.   To learn more about what you can do with MyChart, go to [email or web address].    Your next appointment:   2-3 week(s)  The format for your next appointment:   In Person  Provider:   You may see [name], MD or one of the following Advanced Practice Providers on your designated Care Team:   Murray Hodgkins, NP Christell Faith, PA-C Cadence Kathlen Mody, [place]  Important Information About Sugar

## 2022-02-02 NOTE — Progress Notes (Signed)
[place] at [facility] [phone] [phone]) [phone]   Name: Phillip [name] Date: 02/02/2022 MRN: [id number]  DOB: [birth year]  Patient Care Team: [name], MD as PCP - General (Family Medicine) End, Harrell Gave, MD as PCP - Cardiology (Cardiology) Heath Lark, MD as Consulting Physician (Hematology and Oncology) Marijean Niemann, OD as Consulting Physician (Optometry) Sindy Guadeloupe, MD as Consulting Physician (Oncology) Noreene Filbert, MD as Consulting Physician (Radiation Oncology)    REASON FOR CONSULTATION: Phillip [name] is a 81 y.o. 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  Patient has had ongoing pain related to his malignancy.  Patient was referred to palliative care for ongoing symptom management.  SOCIAL HISTORY:     reports that he has never smoked. He has never used smokeless tobacco. He reports current alcohol use. He reports that he does not use drugs.  Patient is married.  He was a long-distance runner and played college football.  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  He worked in business.  Patient has no biological children but does have stepchildren.  ADVANCE DIRECTIVES:  Not on file  CODE STATUS:   PAST MEDICAL HISTORY: Past Medical History:  Diagnosis Date   Anxiety    Basal cell carcinoma 09/12/2018   Left forehead above lateral brow. Nodular pattern   Cancer ([place])    Coronary artery disease 2003   a. nonobstructive CAD by cath in 2003 and 2005; b.  Status post three-vessel CABG on 12/12/2017 with LIMA to LAD, sequential reverse SVG to OM1 and distal LCx   COVID-19 03/2020   Diverticulosis    Hearing loss    bilateral   Heart  disease    Heart murmur    History of benign prostatic hypertrophy    History of radiation therapy 01/31/10-03/22/10   right tonsil/right neck node 7000 cGy 35 sessions, high risk lymph node volume 5940 cGy 35 sessions, low risk lymph node vol 5600 cGy 35 sessions   HPV in male    positive   HTN (hypertension)    Hyperlipidemia    diet controlled- taking medication d/t ensure drinking   Hypothyroid 01/16/2013   Ischemic cardiomyopathy    a. 10/2017: echo showing reduced EF of 40-45%, HK of the anterior, anteroseptal and apical myocardium with Grade 1 DD.    Neck pain    Persistent atrial fibrillation (HCC)    Primary squamous cell carcinoma of skin of lip 2023   lip and tonuge   Squamous cell carcinoma    right tonsil- 2011   Squamous cell carcinoma of skin 09/02/2019   Crown scalp. WD SCC   Squamous cell carcinoma of skin 09/02/2019   Left distal lateral deltoid. SCCis arising in SK   Thrombocytopenia ([place]) 02/02/2012   unclear etiology   Tinnitus    Tubular adenoma of colon     PAST SURGICAL HISTORY:  Past Surgical History:  Procedure Laterality Date   artoscopic knee     CARDIAC CATHETERIZATION  2003, 2005   40% blockage, two 30% blockages treated medically Ron [facility])   CARDIOVERSION N/A 02/12/2018   Procedure: CARDIOVERSION;  Surgeon: [name], MD;  Location: [facility] ORS;  Service: Cardiovascular;  Laterality: N/A;   CARDIOVERSION N/A 05/10/2021   Procedure:  CARDIOVERSION;  Surgeon: [name], MD;  Location: [facility] OR[facility];  Service: Cardiovascular;  Laterality: N/A;   COLONOSCOPY  03/2015   TA x3, HP, melanosis coli, severe diverticulosis, rpt 3 yrs ([place])   COLONOSCOPY  01/2019   fair prep, diverticulosis, f/u PRN ([place])   CORONARY ARTERY BYPASS GRAFT N/A 12/12/2017   Procedure: CORONARY ARTERY BYPASS GRAFTING (CABG) x3 using the right greater saphenous vein harvested endoscopically and the left internal mammary artery. LIMA to LAD, SEQ SVG to OM1 & OM2;  Surgeon:  [name], MD;  Location: [facility];  Service: Open Heart Surgery;  Laterality: N/A;   DENTAL SURGERY     extractions   IR THORACENTESIS ASP PLEURAL SPACE W/IMG GUIDE  01/17/2018   KNEE ARTHROSCOPY Left    LEFT HEART CATH AND CORONARY ANGIOGRAPHY N/A 12/10/2017   Procedure: LEFT HEART CATH AND CORONARY ANGIOGRAPHY;  Surgeon: [name], MD;  Location: [facility];  Service: Cardiovascular;  Laterality: N/A;   PEG PLACEMENT  02/2010   PORTA CATH INSERTION N/A 01/26/2022   Procedure: PORTA CATH INSERTION;  Surgeon: Katha Cabal, MD;  Location: [place] [facility];  Service: Cardiovascular;  Laterality: N/A;   TEE WITHOUT CARDIOVERSION N/A 12/12/2017   Procedure: TRANSESOPHAGEAL ECHOCARDIOGRAM (TEE);  Surgeon: [name], MD;  Location: [facility];  Service: Open Heart Surgery;  Laterality: N/A;   TONSILLECTOMY     triple bypass  11/2017   VASECTOMY      HEMATOLOGY/ONCOLOGY HISTORY:  Oncology History  Squamous cell carcinoma of buccal mucosa ([place])  11/09/2021 Initial Diagnosis   SCC (squamous cell carcinoma of buccal mucosa) ([place])   11/09/2021 Cancer Staging   Staging form: Oral Cavity, AJCC 8th Edition - Clinical stage from 11/09/2021: Stage I (cT1, cN0, cM0) - Signed by Sindy Guadeloupe, MD on 11/09/2021 Histologic grade (G): G1 Histologic grading system: 3 grade system   Squamous cell carcinoma of oral cavity ([place])  01/18/2022 Initial Diagnosis   Squamous cell carcinoma of oral cavity ([place])   01/18/2022 Cancer Staging   Staging form: Oral Cavity, AJCC 8th Edition - Clinical stage from 01/18/2022: Stage III (cT3, cN1, cM0) - Signed by Sindy Guadeloupe, MD on 01/18/2022   01/30/2022 -  Chemotherapy   Patient is on Treatment Plan : head and neck Carboplatin/PACLitaxel weekly x 6 weeks with XRT         ALLERGIES:  is allergic to ace inhibitors and morphine and related.  MEDICATIONS:  Current Outpatient Medications  Medication Sig Dispense Refill   amLODipine  (NORVASC) 10 MG tablet Take 1 tablet by mouth once daily 90 tablet 0   apixaban (ELIQUIS) 5 MG TABS tablet Take 1 tablet (5 mg total) by mouth 2 (two) times daily. Please restart your Eliquis tomorrow morning 60 tablet 5   Cholecalciferol (VITAMIN D3) 25 MCG (1000 UT) CAPS Take 1 capsule by mouth daily.     dexamethasone (DECADRON) 4 MG tablet Take 2 tablets (8 mg total) by mouth daily. Start the day after chemotherapy for 2 days. 30 tablet 1   docusate sodium (COLACE) 250 MG capsule Takes total 650m /day     FLUoxetine (PROZAC) 20 MG capsule Take 1 capsule (20 mg total) by mouth daily. 30 capsule 6   furosemide (LASIX) 20 MG tablet Take 1 tablet by mouth once daily 30 tablet 2   HURRICAINE 20 % AERO as needed.     levothyroxine (EUTHYROX) 50 MCG tablet Take 1 tablet (50 mcg total) by  mouth daily before breakfast. 30 tablet 8   lidocaine-prilocaine (EMLA) cream Apply to affected area once 30 g 3   MAGNESIUM PO Take 1 tablet by mouth daily.     Multiple Vitamins-Minerals (ZINC PO) Take by mouth daily.     Omega-3 Fatty Acids (OMEGA 3 PO) Take by mouth daily.     ondansetron (ZOFRAN) 8 MG tablet Take 1 tablet (8 mg total) by mouth 2 (two) times daily as needed for refractory nausea / vomiting. Start on day 3 after chemo. 30 tablet 1   ondansetron (ZOFRAN-ODT) 4 MG disintegrating tablet Take 1 tablet (4 mg total) by mouth every 8 (eight) hours as needed for nausea or vomiting. 20 tablet 0   polyethylene glycol (MIRALAX / GLYCOLAX) 17 g packet Take 17 g by mouth daily as needed.     Potassium 99 MG TABS Take 1 tablet by mouth daily.     prochlorperazine (COMPAZINE) 10 MG tablet Take 1 tablet (10 mg total) by mouth every 6 (six) hours as needed (Nausea or vomiting). 30 tablet 1   rosuvastatin (CRESTOR) 20 MG tablet Take 1 tablet by mouth once daily 90 tablet 3   traMADol (ULTRAM) 50 MG tablet Take 1 tablet (50 mg total) by mouth every 6 (six) hours as needed. 30 tablet 0   VITAMIN A PO Take 2,400 mcg  by mouth daily.      vitamin E 400 UNIT capsule Take 400 Units by mouth daily.     Zinc 50 MG CAPS Take 50 mg by mouth daily.     fluocinonide gel (LIDEX) 0.05 % SMARTSIG:Sparingly Topical 3 Times Daily (Patient not taking: Reported on 01/30/2022)     nitroGLYCERIN (NITROSTAT) 0.4 MG SL tablet Place 1 tablet (0.4 mg total) under the tongue every 5 (five) minutes as needed for chest pain (Maximum 3 doses.). (Patient not taking: Reported on 01/18/2022) 25 tablet 2   predniSONE (DELTASONE) 10 MG tablet Take by mouth.     zaleplon (SONATA) 10 MG capsule Take 1 capsule (10 mg total) by mouth at bedtime as needed for sleep. (Patient not taking: Reported on 01/11/2022) 30 capsule 0   No current facility-administered medications for this visit.   Facility-Administered Medications Ordered in Other Visits  Medication Dose Route Frequency Provider Last Rate Last Admin   heparin lock flush 100 unit/mL  500 Units Intravenous Once Sindy Guadeloupe, MD        VITAL SIGNS: BP 90/67 (BP Location: Right Arm, Patient Position: Sitting)   Pulse 91   Resp 18   SpO2 98%  There were no vitals filed for this visit.  Estimated body mass index is 21.83 kg/m as calculated from the following:   Height as of 01/30/22: 6' 2" (1.88 m).   Weight as of 01/30/22: 170 lb (77.1 kg).  LABS: CBC:    Component Value Date/Time   WBC 12.0 (H) 01/30/2022 0751   HGB 16.7 01/30/2022 0751   HGB 16.9 04/13/2021 1609   HGB 16.7 12/29/2013 1351   HCT 49.2 01/30/2022 0751   HCT 49.9 04/13/2021 1609   HCT 49.2 12/29/2013 1351   PLT 191 01/30/2022 0751   PLT 131 (L) 04/13/2021 1609   MCV 97.0 01/30/2022 0751   MCV 98 (H) 04/13/2021 1609   MCV 97.1 12/29/2013 1351   NEUTROABS 8.8 (H) 01/30/2022 0751   NEUTROABS 3.7 03/20/2018 0947   NEUTROABS 6.0 12/29/2013 1351   LYMPHSABS 2.4 01/30/2022 0751   LYMPHSABS 0.5 (L) 03/20/2018 4431  LYMPHSABS 0.4 (L) 12/29/2013 1351   MONOABS 0.6 01/30/2022 0751   MONOABS 0.5 12/29/2013 1351    EOSABS 0.1 01/30/2022 0751   EOSABS 0.1 03/20/2018 0947   BASOSABS 0.1 01/30/2022 0751   BASOSABS 0.0 03/20/2018 0947   BASOSABS 0.0 12/29/2013 1351   Comprehensive Metabolic Panel:    Component Value Date/Time   NA 140 01/30/2022 0751   NA 142 05/13/2021 0955   NA 141 12/29/2013 1351   K 3.7 01/30/2022 0751   K 4.2 12/29/2013 1351   CL 103 01/30/2022 0751   CL 106 01/16/2013 0938   CO2 26 01/30/2022 0751   CO2 23 12/29/2013 1351   BUN 24 (H) 01/30/2022 0751   BUN 15 05/13/2021 0955   BUN 17.2 12/29/2013 1351   CREATININE 0.92 01/30/2022 0751   CREATININE 0.9 12/29/2013 1351   GLUCOSE 179 (H) 01/30/2022 0751   GLUCOSE 125 12/29/2013 1351   GLUCOSE 108 (H) 01/16/2013 0938   CALCIUM 9.3 01/30/2022 0751   CALCIUM 9.9 12/29/2013 1351   AST 28 01/30/2022 0751   AST 18 12/29/2013 1351   ALT 16 01/30/2022 0751   ALT 21 12/29/2013 1351   ALKPHOS 58 01/30/2022 0751   ALKPHOS 74 12/29/2013 1351   BILITOT 1.0 01/30/2022 0751   BILITOT 0.4 04/13/2021 1609   BILITOT 0.30 12/29/2013 1351   PROT 6.9 01/30/2022 0751   PROT 6.7 04/13/2021 1609   PROT 7.1 12/29/2013 1351   ALBUMIN 3.8 01/30/2022 0751   ALBUMIN 4.6 04/13/2021 1609   ALBUMIN 4.0 12/29/2013 1351    RADIOGRAPHIC STUDIES: PERIPHERAL VASCULAR CATHETERIZATION  Result Date: 01/26/2022 See surgical note for result.  NM PET Image Initial (PI) Skull Base To Thigh  Result Date: 01/15/2022 CLINICAL DATA:  Initial treatment strategy for squamous cell carcinoma of the buccal mucosa. EXAM: NUCLEAR MEDICINE PET SKULL BASE TO THIGH TECHNIQUE: 8.8 mCi F-18 FDG was injected intravenously. Full-ring PET imaging was performed from the skull base to thigh after the radiotracer. CT data was obtained and used for attenuation correction and anatomic localization. Fasting blood glucose: 144 mg/dl COMPARISON:  PET-CT 10/25/2021. CT neck 01/30/2012 and abdominopelvic CT 03/13/2010. FINDINGS: Mediastinal blood pool activity: SUV max 2.4 NECK:  Intense focal hypermetabolic activity within the soft tissues lateral to the mandible on the right (SUV max 9.7), corresponding with an ill-defined soft tissue mass measuring approximately 3.4 x 2.2 cm on image 38/4. There is asymmetric hypermetabolic activity in the right lateral submandibular space at the level of the angle of the mandible (SUV max 4.5), suspicious for a 1B nodal metastasis. No enlarged cervical lymph nodes are identified. However, there are small hypermetabolic level III bilaterally (SUV max 3.9 on the right and 3.5 on the left).No other definite lesions of the pharyngeal mucosal space. Activity within the lymphoid tissue of Waldeyer's ring is within physiologic limits. Probable left mandibular dental caries. Incidental CT findings: Bilateral carotid atherosclerosis. As above, probable left mandibular dental caries. CHEST: There are no hypermetabolic mediastinal, hilar or axillary lymph nodes. No hypermetabolic pulmonary activity or suspicious nodularity. Incidental CT findings: Status post median sternotomy and CABG. There is diffuse atherosclerosis of the aorta, great vessels and coronary arteries. Aortic valvular calcifications are present. There is a small hiatal hernia. The lungs are clear. ABDOMEN/PELVIS: There is no hypermetabolic activity within the liver, adrenal glands, spleen or pancreas. There is no hypermetabolic nodal activity. Incidental CT findings: Similar low-density renal lesions bilaterally without hypermetabolic activity, consistent with incidental cysts; no follow-up imaging recommended. Aortic  atherosclerosis. Distal colonic diverticulosis. SKELETON: There is no hypermetabolic activity to suggest osseous metastatic disease. No gross mandibular osseous destruction. Facial assessment is mildly limited by beam hardening artifact from the patient's dental amalgam. Incidental CT findings: none IMPRESSION: 1. Intensely hypermetabolic right mandibular buccal soft tissue mass  consistent with known squamous cell carcinoma. Suspected small nodal metastases involving level 1B on the right and level III bilaterally. 2. No evidence of distant metastatic disease. 3. Carotid, coronary and Aortic Atherosclerosis (ICD10-I70.0). Electronically Signed   By: Richardean Sale M.D.   On: 01/15/2022 11:16    PERFORMANCE STATUS (ECOG) : 1 - Symptomatic but completely ambulatory  Review of Systems Unless otherwise noted, a complete review of systems is negative.  Physical Exam General: NAD HEENT: Swollen right lip Pulmonary: Unlabored Extremities: no edema, no joint deformities Skin: no rashes Neurological: Weakness but otherwise nonfocal  IMPRESSION: I met with patient and his wife today while he was receiving IV fluids.  Patient reports he is doing reasonably well.  His only symptomatic complaint is mouth pain for which he is using HurriCaine spray.  Patient reports that this temporarily numbs and lessens the pain but only last for about 20 minutes.  He is also taking acetaminophen on occasion as he is trying to limit the constipating effects from the tramadol.  Will trial viscous lidocaine for pain.  Patient reports having good social support from his wife.  He no longer drives but is fairly independent.  He met yesterday with nutritionist and plans to start on oral nutritional supplements.  PLAN: -Continue current scope of treatment -Viscous lidocaine oral rinse Rx sent to [facility] for compounding -Tramadol as needed -Daily bowel regimen with MiraLAX/senna -RTC 3 to 4 weeks   Patient expressed understanding and was in agreement with this plan. He also understands that He can call the clinic at any time with any questions, concerns, or complaints.     Time Total: 20 minutes  Visit consisted of counseling and education dealing with the complex and emotionally intense issues of symptom management and palliative care in the setting of serious and potentially  life-threatening illness.Greater than 50%  of this time was spent counseling and coordinating care related to the above assessment and plan.  Signed by: Altha Harm, PhD, NP-C

## 2022-02-02 NOTE — Progress Notes (Signed)
Follow-up Outpatient Visit Date: 02/02/2022  Primary Care Provider: [name], MD [place] [address]  Chief Complaint: Atrial fibrillation  HPI:  [name] is a 81 y.o. male with history of coronary artery disease status post CABG in 01/453, chronic systolic heart failure secondary to ischemic cardiomyopathy, persistent atrial fibrillation, hypertension, hyperlipidemia, and tonsillar cancer, who presents for evaluation of recurrent atrial fibrillation.  I last saw [name] in January, at which time he was feeling fairly well though he noted some labile blood pressures at home.  He also voiced concerns about chronic irritation along the right side of his cheek that has been present ever since he underwent radiation for tonsillar cancer several years ago.  We directed him to [facility], where he was ultimately diagnosed by Dr. Tami Ribas with squamous cell carcinoma.  He underwent radiation therapy and is now receiving chemotherapy through Dr. Janese Banks.  At his recent oncology visit 3 days ago, he complained of some palpitations and was felt to be in atrial fibrillation (I do not see EKG done at that time, however).  Mr. Fonseca believes he went into a-fib about 2-3 weeks ago, because his resting heart rate jumped from his usually 50's up to the 80's.  He also reports intermittent palpitations and lightheadedness.  He held apixaban for 2 days in anticipation of port placement last week but has been back on apixaban since then.  He denies chest pain.  He is tired but has not had any frank shortness of breath.  He is trying to eat/drink, though it is difficult at times on account of his oral cancer.  He is undergoing more XRT and chemotherapy at this time.  --------------------------------------------------------------------------------------------------  Past Medical History:  Diagnosis Date   Anxiety    Basal cell carcinoma 09/12/2018   Left forehead above lateral  brow. Nodular pattern   Cancer ([place])    Coronary artery disease 2003   a. nonobstructive CAD by cath in 2003 and 2005; b.  Status post three-vessel CABG on 12/12/2017 with LIMA to LAD, sequential reverse SVG to OM1 and distal LCx   COVID-19 03/2020   Diverticulosis    Hearing loss    bilateral   Heart disease    Heart murmur    History of benign prostatic hypertrophy    History of radiation therapy 01/31/10-03/22/10   right tonsil/right neck node 7000 cGy 35 sessions, high risk lymph node volume 5940 cGy 35 sessions, low risk lymph node vol 5600 cGy 35 sessions   HPV in male    positive   HTN (hypertension)    Hyperlipidemia    diet controlled- taking medication d/t ensure drinking   Hypothyroid 01/16/2013   Ischemic cardiomyopathy    a. 10/2017: echo showing reduced EF of 40-45%, HK of the anterior, anteroseptal and apical myocardium with Grade 1 DD.    Neck pain    Persistent atrial fibrillation (HCC)    Primary squamous cell carcinoma of skin of lip 2023   lip and tonuge   Squamous cell carcinoma    right tonsil- 2011   Squamous cell carcinoma of skin 09/02/2019   Crown scalp. WD SCC   Squamous cell carcinoma of skin 09/02/2019   Left distal lateral deltoid. SCCis arising in SK   Thrombocytopenia ([place]) 02/02/2012   unclear etiology   Tinnitus    Tubular adenoma of colon    Past Surgical History:  Procedure Laterality Date   artoscopic knee  CARDIAC CATHETERIZATION  2003, 2005   40% blockage, two 30% blockages treated medically Ron Parker)   CARDIOVERSION N/A 02/12/2018   Procedure: CARDIOVERSION;  Surgeon: [name], MD;  Location: [facility];  Service: Cardiovascular;  Laterality: N/A;   CARDIOVERSION N/A 05/10/2021   Procedure: CARDIOVERSION;  Surgeon: [name], MD;  Location: [facility];  Service: Cardiovascular;  Laterality: N/A;   COLONOSCOPY  03/2015   TA x3, HP, melanosis coli, severe diverticulosis, rpt 3 yrs ([place])   COLONOSCOPY  01/2019   fair prep,  diverticulosis, f/u PRN ([place])   CORONARY ARTERY BYPASS GRAFT N/A 12/12/2017   Procedure: CORONARY ARTERY BYPASS GRAFTING (CABG) x3 using the right greater saphenous vein harvested endoscopically and the left internal mammary artery. LIMA to LAD, SEQ SVG to OM1 & OM2;  Surgeon: [name], MD;  Location: [place];  Service: Open Heart Surgery;  Laterality: N/A;   DENTAL SURGERY     extractions   IR THORACENTESIS ASP PLEURAL SPACE W/IMG GUIDE  01/17/2018   KNEE ARTHROSCOPY Left    LEFT HEART CATH AND CORONARY ANGIOGRAPHY N/A 12/10/2017   Procedure: LEFT HEART CATH AND CORONARY ANGIOGRAPHY;  Surgeon: [name], MD;  Location: [place] [facility];  Service: Cardiovascular;  Laterality: N/A;   PEG PLACEMENT  02/2010   PORTA CATH INSERTION N/A 01/26/2022   Procedure: PORTA CATH INSERTION;  Surgeon: Katha Cabal, MD;  Location: [place] [facility];  Service: Cardiovascular;  Laterality: N/A;   TEE WITHOUT CARDIOVERSION N/A 12/12/2017   Procedure: TRANSESOPHAGEAL ECHOCARDIOGRAM (TEE);  Surgeon: [name], MD;  Location: [place];  Service: Open Heart Surgery;  Laterality: N/A;   TONSILLECTOMY     triple bypass  11/2017   VASECTOMY      Current Meds  Medication Sig   amLODipine (NORVASC) 10 MG tablet Take 1 tablet by mouth once daily   apixaban (ELIQUIS) 5 MG TABS tablet Take 1 tablet (5 mg total) by mouth 2 (two) times daily. Please restart your Eliquis tomorrow morning   Benzocaine (HURRICAINE) 20 % AERO Use as directed 1 Application in the mouth or throat as needed.   Cholecalciferol (VITAMIN D3) 25 MCG (1000 UT) CAPS Take 1 capsule by mouth daily.   dexamethasone (DECADRON) 4 MG tablet Take 2 tablets (8 mg total) by mouth daily. Start the day after chemotherapy for 2 days.   docusate sodium (COLACE) 250 MG capsule Takes total '600mg'$  /day   fluocinonide gel (LIDEX) 0.05 %    FLUoxetine (PROZAC) 20 MG capsule Take 1 capsule (20 mg total) by mouth daily.   furosemide (LASIX)  20 MG tablet Take 1 tablet by mouth once daily   levothyroxine (EUTHYROX) 50 MCG tablet Take 1 tablet (50 mcg total) by mouth daily before breakfast.   lidocaine-prilocaine (EMLA) cream Apply to affected area once   MAGNESIUM PO Take 1 tablet by mouth daily.   Multiple Vitamins-Minerals (ZINC PO) Take by mouth daily.   nitroGLYCERIN (NITROSTAT) 0.4 MG SL tablet Place 1 tablet (0.4 mg total) under the tongue every 5 (five) minutes as needed for chest pain (Maximum 3 doses.).   Omega-3 Fatty Acids (OMEGA 3 PO) Take by mouth daily.   ondansetron (ZOFRAN) 8 MG tablet Take 1 tablet (8 mg total) by mouth 2 (two) times daily as needed for refractory nausea / vomiting. Start on day 3 after chemo.   ondansetron (ZOFRAN-ODT) 4 MG disintegrating tablet Take 1 tablet (4 mg total) by mouth every 8 (eight) hours as needed  for nausea or vomiting.   polyethylene glycol (MIRALAX / GLYCOLAX) 17 g packet Take 17 g by mouth daily as needed.   Potassium 99 MG TABS Take 1 tablet by mouth daily.   prochlorperazine (COMPAZINE) 10 MG tablet Take 1 tablet (10 mg total) by mouth every 6 (six) hours as needed (Nausea or vomiting).   rosuvastatin (CRESTOR) 20 MG tablet Take 1 tablet by mouth once daily   traMADol (ULTRAM) 50 MG tablet Take 1 tablet (50 mg total) by mouth every 6 (six) hours as needed.   VITAMIN A PO Take 2,400 mcg by mouth daily.    vitamin E 400 UNIT capsule Take 400 Units by mouth daily.   zaleplon (SONATA) 10 MG capsule Take 1 capsule (10 mg total) by mouth at bedtime as needed for sleep.   Zinc 50 MG CAPS Take 50 mg by mouth daily.    Allergies: Ace inhibitors and Morphine and related  Social History   Tobacco Use   Smoking status: Never   Smokeless tobacco: Never  Vaping Use   Vaping Use: Never used  Substance Use Topics   Alcohol use: Not Currently    Comment: glass of wine 1-2 times a month   Drug use: No    Family History  Problem Relation Age of Onset   Coronary artery disease  Father    Heart attack Father 61   Heart disease Father    Stroke Mother    Colon cancer Neg Hx    Esophageal cancer Neg Hx    Rectal cancer Neg Hx    Stomach cancer Neg Hx    Kidney cancer Neg Hx    Kidney failure Neg Hx    Prostate cancer Neg Hx    Tuberculosis Neg Hx     Review of Systems: A 12-system review of systems was performed and was negative except as noted in the HPI.  --------------------------------------------------------------------------------------------------  Physical Exam: BP 110/70 (BP Location: Left Arm, Patient Position: Sitting, Cuff Size: Normal)   Pulse 81   Ht '6\' 2"'$  (1.88 m)   Wt 177 lb (80.3 kg)   SpO2 98%   BMI 22.73 kg/m   General:  NAD. Neck: No JVD or HJR. Lungs: Clear to auscultation bilaterally without wheezes or crackles. Heart: Irregularly irregular rhythm without murmurs. Abdomen: Soft, nontender, nondistended. Extremities: No lower extremity edema.  EKG:  Atrial fibrillation with left axis deviation and left bundle branch block.  Atrial fibrillation is new since 09/02/2021.  Lab Results  Component Value Date   WBC 12.0 (H) 01/30/2022   HGB 16.7 01/30/2022   HCT 49.2 01/30/2022   MCV 97.0 01/30/2022   PLT 191 01/30/2022    Lab Results  Component Value Date   NA 140 01/30/2022   K 3.7 01/30/2022   CL 103 01/30/2022   CO2 26 01/30/2022   BUN 24 (H) 01/30/2022   CREATININE 0.92 01/30/2022   GLUCOSE 179 (H) 01/30/2022   ALT 16 01/30/2022    Lab Results  Component Value Date   CHOL 102 04/26/2021   HDL 38.60 (L) 04/26/2021   LDLCALC 50 04/26/2021   LDLDIRECT 101.0 10/13/2014   TRIG 67.0 04/26/2021   CHOLHDL 3 04/26/2021    --------------------------------------------------------------------------------------------------  ASSESSMENT AND PLAN: Persistent atrial fibrillation: Mr. Fina is back in a-fib, which appears to have started about 2-3 weeks ago.  He has been compliant with anticoagulation, though it was held  for 2 days last week to allow for port placement.  His ventricular  rate is adequately controlled at this time.  He should continue apixaban 5 mg BID.  I will not add any AV nodal blocking agents today, given reasonable rate control today and history of sinus bradycardia at baseline.  Due to borderline low BP and lightheadedness, we will cut amlodipine back to 5 mg daily.  We will see him back in 2-3 weeks; if he remains in a-fib at that time, we will discuss moving forward with DCCV after completion of 4 weeks of uninterrupted anticoagulation.  CAD: No angina reported.  Continue secondary prevention.  Hypertension: BP low normal today with intermittent LH reported.  We will decrease amlodipine to 5 mg daily.  Oral cancer: Ongoing management per heme-onc and rad-onc.  Follow-up: RTC 2-3 weeks.  [name], MD 02/02/2022 3:39 PM

## 2022-02-03 ENCOUNTER — Other Ambulatory Visit: Payer: Self-pay

## 2022-02-03 ENCOUNTER — Ambulatory Visit
Admission: RE | Admit: 2022-02-03 | Discharge: 2022-02-03 | Disposition: A | Discharge status: Home / Self Care | Payer: PPO | Source: Ambulatory Visit | Attending: Radiation Oncology | Admitting: Radiation Oncology

## 2022-02-03 ENCOUNTER — Ambulatory Visit: Payer: PPO

## 2022-02-03 DIAGNOSIS — C4402 Squamous cell carcinoma of skin of lip: Secondary | ICD-10-CM | POA: Diagnosis not present

## 2022-02-03 DIAGNOSIS — Z5111 Encounter for antineoplastic chemotherapy: Secondary | ICD-10-CM | POA: Diagnosis not present

## 2022-02-03 LAB — RAD ONC ARIA SESSION SUMMARY
Course Elapsed Days: 4
Plan Fractions Treated to Date: 5
Plan Prescribed Dose Per Fraction: 2 Gy
Plan Total Fractions Prescribed: 25
Plan Total Prescribed Dose: 50 Gy
Reference Point Dosage Given to Date: 10 Gy
Reference Point Session Dosage Given: 2 Gy
Session Number: 5

## 2022-02-03 MED FILL — Dexamethasone Sodium Phosphate Inj 100 MG/10ML: INTRAMUSCULAR | Qty: 1 | Status: AC

## 2022-02-04 ENCOUNTER — Encounter: Payer: Self-pay | Admitting: Internal Medicine

## 2022-02-06 ENCOUNTER — Ambulatory Visit
Admission: RE | Admit: 2022-02-06 | Discharge: 2022-02-06 | Disposition: A | Discharge status: Home / Self Care | Payer: PPO | Source: Ambulatory Visit | Attending: Radiation Oncology | Admitting: Radiation Oncology

## 2022-02-06 ENCOUNTER — Ambulatory Visit: Payer: PPO

## 2022-02-06 ENCOUNTER — Inpatient Hospital Stay (HOSPITAL_BASED_OUTPATIENT_CLINIC_OR_DEPARTMENT_OTHER): Payer: PPO | Admitting: Nurse Practitioner

## 2022-02-06 ENCOUNTER — Inpatient Hospital Stay: Payer: PPO

## 2022-02-06 ENCOUNTER — Other Ambulatory Visit: Payer: Self-pay

## 2022-02-06 VITALS — BP 86/62 | HR 81 | Temp 98.4°F | Resp 16 | Wt 170.0 lb

## 2022-02-06 VITALS — BP 131/90 | HR 68

## 2022-02-06 DIAGNOSIS — C069 Malignant neoplasm of mouth, unspecified: Secondary | ICD-10-CM | POA: Diagnosis not present

## 2022-02-06 DIAGNOSIS — C4402 Squamous cell carcinoma of skin of lip: Secondary | ICD-10-CM | POA: Diagnosis not present

## 2022-02-06 DIAGNOSIS — Z5111 Encounter for antineoplastic chemotherapy: Secondary | ICD-10-CM

## 2022-02-06 LAB — COMPREHENSIVE METABOLIC PANEL
ALT: 36 U/L (ref 0–44)
AST: 35 U/L (ref 15–41)
Albumin: 3.5 g/dL (ref 3.5–5.0)
Alkaline Phosphatase: 55 U/L (ref 38–126)
Anion gap: 8 (ref 5–15)
BUN: 21 mg/dL (ref 8–23)
CO2: 27 mmol/L (ref 22–32)
Calcium: 8.9 mg/dL (ref 8.9–10.3)
Chloride: 103 mmol/L (ref 98–111)
Creatinine, Ser: 0.98 mg/dL (ref 0.61–1.24)
GFR, Estimated: >60 mL/min (ref >60)
Glucose, Bld: 216 mg/dL — ABNORMAL HIGH (ref 70–99)
Potassium: 4.1 mmol/L (ref 3.5–5.1)
Sodium: 138 mmol/L (ref 135–145)
Total Bilirubin: 0.9 mg/dL (ref 0.3–1.2)
Total Protein: 6.2 g/dL — ABNORMAL LOW (ref 6.5–8.1)

## 2022-02-06 LAB — RAD ONC ARIA SESSION SUMMARY
Course Elapsed Days: 7
Plan Fractions Treated to Date: 6
Plan Prescribed Dose Per Fraction: 2 Gy
Plan Total Fractions Prescribed: 25
Plan Total Prescribed Dose: 50 Gy
Reference Point Dosage Given to Date: 12 Gy
Reference Point Session Dosage Given: 2 Gy
Session Number: 6

## 2022-02-06 LAB — CBC WITH DIFFERENTIAL/PLATELET
Abs Immature Granulocytes: 0.03 10*3/uL (ref 0.00–0.07)
Basophils Absolute: 0 10*3/uL (ref 0.0–0.1)
Basophils Relative: 0 %
Eosinophils Absolute: 0 10*3/uL (ref 0.0–0.5)
Eosinophils Relative: 1 %
HCT: 42.1 % (ref 39.0–52.0)
Hemoglobin: 14.5 g/dL (ref 13.0–17.0)
Immature Granulocytes: 1 %
Lymphocytes Relative: 16 %
Lymphs Abs: 1 10*3/uL (ref 0.7–4.0)
MCH: 33.6 pg (ref 26.0–34.0)
MCHC: 34.4 g/dL (ref 30.0–36.0)
MCV: 97.5 fL (ref 80.0–100.0)
Monocytes Absolute: 0.2 10*3/uL (ref 0.1–1.0)
Monocytes Relative: 3 %
Neutro Abs: 4.7 10*3/uL (ref 1.7–7.7)
Neutrophils Relative %: 79 %
Platelets: 146 10*3/uL — ABNORMAL LOW (ref 150–400)
RBC: 4.32 MIL/uL (ref 4.22–5.81)
RDW: 11.9 % (ref 11.5–15.5)
WBC: 6 10*3/uL (ref 4.0–10.5)
nRBC: 0 % (ref 0.0–0.2)

## 2022-02-06 MED ORDER — HEPARIN SOD (PORK) LOCK FLUSH 100 UNIT/ML IV SOLN
INTRAVENOUS | Status: AC
  Administered 2022-02-06: 500 [IU]
  Filled 2022-02-06: qty 5

## 2022-02-06 MED ORDER — SODIUM CHLORIDE 0.9 % IV SOLN
10.0000 mg | Freq: Once | INTRAVENOUS | Status: AC
  Administered 2022-02-06: 10 mg via INTRAVENOUS
  Filled 2022-02-06: qty 10

## 2022-02-06 MED ORDER — HEPARIN SOD (PORK) LOCK FLUSH 100 UNIT/ML IV SOLN
500.0000 [IU] | Freq: Once | INTRAVENOUS | Status: AC | PRN
  Filled 2022-02-06: qty 5

## 2022-02-06 MED ORDER — SODIUM CHLORIDE 0.9 % IV SOLN
50.0000 mg/m2 | Freq: Once | INTRAVENOUS | Status: AC
  Administered 2022-02-06: 102 mg via INTRAVENOUS
  Filled 2022-02-06: qty 17

## 2022-02-06 MED ORDER — DIPHENHYDRAMINE HCL 50 MG/ML IJ SOLN
50.0000 mg | Freq: Once | INTRAMUSCULAR | Status: AC
  Administered 2022-02-06: 50 mg via INTRAVENOUS
  Filled 2022-02-06: qty 1

## 2022-02-06 MED ORDER — SODIUM CHLORIDE 0.9 % IV SOLN
Freq: Once | INTRAVENOUS | Status: AC
  Filled 2022-02-06: qty 250

## 2022-02-06 MED ORDER — FAMOTIDINE IN NACL 20-0.9 MG/50ML-% IV SOLN
20.0000 mg | Freq: Once | INTRAVENOUS | Status: AC
  Administered 2022-02-06: 20 mg via INTRAVENOUS
  Filled 2022-02-06: qty 50

## 2022-02-06 MED ORDER — SODIUM CHLORIDE 0.9 % IV SOLN
180.0000 mg | Freq: Once | INTRAVENOUS | Status: AC
  Administered 2022-02-06: 180 mg via INTRAVENOUS
  Filled 2022-02-06: qty 18

## 2022-02-06 MED ORDER — PALONOSETRON HCL INJECTION 0.25 MG/5ML
0.2500 mg | Freq: Once | INTRAVENOUS | Status: AC
  Administered 2022-02-06: 0.25 mg via INTRAVENOUS
  Filled 2022-02-06: qty 5

## 2022-02-06 NOTE — Patient Instructions (Signed)
[facility]  Discharge Instructions: Thank you for choosing [facility] to provide your oncology and hematology care.  If you have a lab appointment with the Cancer Center, please go directly to the Cancer Center and check in at the registration area.  Wear comfortable clothing and clothing appropriate for easy access to any Portacath or PICC line.   We strive to give you quality time with your provider. You may need to reschedule your appointment if you arrive late (15 or more minutes).  Arriving late affects you and other patients whose appointments are after yours.  Also, if you miss three or more appointments without notifying the office, you may be dismissed from the clinic at the provider's discretion.      For prescription refill requests, have your pharmacy contact our office and allow 72 hours for refills to be completed.    Today you received the following chemotherapy and/or immunotherapy agents Carboplatin & Taxol      To help prevent nausea and vomiting after your treatment, we encourage you to take your nausea medication as directed.  BELOW ARE SYMPTOMS THAT SHOULD BE REPORTED IMMEDIATELY: *FEVER GREATER THAN 100.4 F (38 C) OR HIGHER *CHILLS OR SWEATING *NAUSEA AND VOMITING THAT IS NOT CONTROLLED WITH YOUR NAUSEA MEDICATION *UNUSUAL SHORTNESS OF BREATH *UNUSUAL BRUISING OR BLEEDING *URINARY PROBLEMS (pain or burning when urinating, or frequent urination) *BOWEL PROBLEMS (unusual diarrhea, constipation, pain near the anus) TENDERNESS IN MOUTH AND THROAT WITH OR WITHOUT PRESENCE OF ULCERS (sore throat, sores in mouth, or a toothache) UNUSUAL RASH, SWELLING OR PAIN  UNUSUAL VAGINAL DISCHARGE OR ITCHING   Items with * indicate a potential emergency and should be followed up as soon as possible or go to the Emergency Department if any problems should occur.  Please show the CHEMOTHERAPY ALERT CARD or IMMUNOTHERAPY ALERT CARD at  check-in to the Emergency Department and triage nurse.  Should you have questions after your visit or need to cancel or reschedule your appointment, please contact [facility]  [phone] and follow the prompts.  Office hours are 8:00 a.m. to 4:30 p.m. Monday - Friday. Please note that voicemails left after 4:00 p.m. may not be returned until the following business day.  We are closed weekends and major holidays. You have access to a nurse at all times for urgent questions. Please call the main number to the clinic [phone] and follow the prompts.  For any non-urgent questions, you may also contact your provider using MyChart. We now offer e-Visits for anyone 18 and older to request care online for non-urgent symptoms. For details visit [email or web address].   Also download the MyChart app! Go to the app store, search "MyChart", open the app, select [facility], and log in with your MyChart username and password.  Masks are optional in the cancer centers. If you would like for your care team to wear a mask while they are taking care of you, please let them know. For doctor visits, patients may have with them one support person who is at least 82 years old. At this time, visitors are not allowed in the infusion area.   

## 2022-02-06 NOTE — Progress Notes (Signed)
Returns for follow-up. Endorses occasional dizziness. States that Amlodipine was recently reduced by cardiologist. BP in clinic 86/62. NP informed of BP. Per Phillip [name], 1L NS to be given during treatment today. Infusion nurse made aware.

## 2022-02-06 NOTE — Progress Notes (Signed)
Hematology/Oncology Consult Note 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  He presented to ENT with symptoms of her right cheek mucosal irritation which did not get better after topical remedies.  This was ultimately biopsied by Dr. Tami Ribas and came back positive for squamous cell carcinoma while differentiated and keratinizing.  This has not been deemed to be resectable by ENT and therefore referred for consideration of radiation or chemotherapy.  PET CT scan did not show anyEvidence of locoregional or distant metastatic disease.  Patient received definitive radiation to this area.  A few weeks after radiation was completed patient developed a rapidly growing mass anterior to his original site of disease involvement.   He had a repeat biopsy by ENT which showed squamous cell carcinoma with basaloid features and minimal focal keratinization.  Repeat PET CT scan showed3.4 x 2.2 cm ill-defined soft tissue mass within the soft tissues lateral to the mandible on the right side.  Hypermetabolic activity in the right lateral submandibular space at the level of the  angle of mandible with an SUV of 4.5 suspicious for 1B nodal metastases.  Small hypermetabolic level 3 activity bilaterally with an SUV of 3.9 and 3.5 respectively concerning for possible disease involvement.  No evidence of distant metastatic disease.    Interval history-patient is 81 year old male with above history of recurrent squamous cell carcinoma of the right buccal mucosa who presents for consideration of carbo-taxol chemotherapy. In the interim, he has seen Dr. End/cardiology. Amlodipine was reduced. He was found to be in a fib. He has episodes of dizziness but improved with regularly scheduled IV fluids. Weight decreasing. He is considering a peg tube. Pain is stable. Able to swallow pills.   ECOG PS- 2 Pain scale- 6 Opioid associated constipation- no  Review of systems- Review of Systems  Constitutional:  Negative for chills, fever, malaise/fatigue and weight loss.  HENT:  Negative for congestion, ear discharge and nosebleeds.        Mouth pain  Eyes:  Negative for blurred vision.  Respiratory:  Negative for cough, hemoptysis, sputum production, shortness of breath and wheezing.   Cardiovascular:  Negative for chest pain, palpitations, orthopnea and claudication.  Gastrointestinal:  Negative for abdominal pain, blood in stool, constipation, diarrhea, heartburn, melena, nausea and vomiting.  Genitourinary:  Negative for dysuria, flank pain, frequency, hematuria and urgency.  Musculoskeletal:  Negative for back pain, joint pain and myalgias.  Skin:  Negative for rash.  Neurological:  Negative for dizziness, tingling, focal weakness, seizures, weakness and headaches.  Endo/Heme/Allergies:  Does not bruise/bleed easily.  Psychiatric/Behavioral:  Negative for depression and suicidal ideas. The patient is not nervous/anxious and does not have insomnia.     Allergies  Allergen Reactions   Ace Inhibitors Swelling and Other (  See Comments)    Possible angioedema (upper lip swelling)    Morphine And Related Swelling    SWELLING REACTION UNSPECIFIED  [severity rated per PMH, 12/11/2017]   Past Medical History:  Diagnosis Date   Anxiety    Basal cell carcinoma 09/12/2018   Left forehead above lateral brow. Nodular pattern   Cancer ([place])    Coronary artery disease 2003   a. nonobstructive CAD by cath in 2003 and 2005; b.  Status post three-vessel CABG on 12/12/2017 with LIMA to LAD, sequential reverse SVG to OM1 and distal LCx   COVID-19 03/2020   Diverticulosis    Hearing loss    bilateral   Heart disease    Heart murmur    History of benign prostatic hypertrophy    History of radiation therapy 01/31/10-03/22/10   right tonsil/right neck node 7000 cGy 35 sessions, high risk lymph node volume 5940 cGy 35 sessions, low risk lymph node vol 5600 cGy 35 sessions   HPV in male    positive   HTN (hypertension)    Hyperlipidemia    diet controlled- taking medication d/t ensure drinking   Hypothyroid 01/16/2013   Ischemic cardiomyopathy    a. 10/2017: echo showing reduced EF of 40-45%, HK of the anterior, anteroseptal and apical myocardium with Grade 1 DD.    Neck pain    Persistent atrial fibrillation (HCC)    Primary squamous cell carcinoma of skin of lip 2023   lip and tonuge   Squamous cell carcinoma    right tonsil- 2011   Squamous cell carcinoma of skin 09/02/2019   Crown scalp. WD SCC   Squamous cell carcinoma of skin 09/02/2019   Left distal lateral deltoid. SCCis arising in SK   Thrombocytopenia ([place]) 02/02/2012   unclear etiology   Tinnitus    Tubular adenoma of colon    Past Surgical History:  Procedure Laterality Date   artoscopic knee     CARDIAC CATHETERIZATION  2003, 2005   40% blockage, two 30% blockages treated medically Ron Parker)   CARDIOVERSION N/A 02/12/2018   Procedure: CARDIOVERSION;  Surgeon: [name], MD;  Location: [facility];  Service: Cardiovascular;  Laterality: N/A;   CARDIOVERSION N/A 05/10/2021   Procedure: CARDIOVERSION;  Surgeon:  [name], MD;  Location: [facility];  Service: Cardiovascular;  Laterality: N/A;   COLONOSCOPY  03/2015   TA x3, HP, melanosis coli, severe diverticulosis, rpt 3 yrs (Pyrtle)   COLONOSCOPY  01/2019   fair prep, diverticulosis, f/u PRN (Pyrtle)   CORONARY ARTERY BYPASS GRAFT N/A 12/12/2017   Procedure: CORONARY ARTERY BYPASS GRAFTING (CABG) x3 using the right greater saphenous vein harvested endoscopically and the left internal mammary artery. LIMA to LAD, SEQ SVG to OM1 & OM2;  Surgeon: [name], MD;  Location: [place];  Service: Open Heart Surgery;  Laterality: N/A;   DENTAL SURGERY     extractions   IR THORACENTESIS ASP PLEURAL SPACE W/IMG GUIDE  01/17/2018   KNEE ARTHROSCOPY Left    LEFT HEART CATH AND CORONARY ANGIOGRAPHY N/A 12/10/2017   Procedure: LEFT HEART CATH AND CORONARY ANGIOGRAPHY;  Surgeon: [name], MD;  Location: [place] [facility];  Service: Cardiovascular;  Laterality: N/A;   PEG PLACEMENT  02/2010   PORTA CATH INSERTION N/A 01/26/2022   Procedure: PORTA CATH INSERTION;  Surgeon: Katha Cabal, MD;  Location: [place] [facility];  Service: Cardiovascular;  Laterality: N/A;   TEE WITHOUT CARDIOVERSION N/A 12/12/2017   Procedure: TRANSESOPHAGEAL ECHOCARDIOGRAM (TEE);  Surgeon: [name], MD;  Location: [place];  Service: Open Heart Surgery;  Laterality: N/A;   TONSILLECTOMY     triple bypass  11/2017   VASECTOMY     Social History   Socioeconomic History   Marital status: Married    Spouse name: Electrical engineer   Number of children: 1   Years of education: Not on file   Highest education level: Not on file  Occupational History   Not on file  Tobacco Use   Smoking status: Never   Smokeless tobacco: Never  Vaping Use   Vaping Use: Never used  Substance and Sexual Activity   Alcohol use: Not Currently    Comment: glass of wine 1-2 times a month   Drug use: No   Sexual activity: Yes  Other Topics Concern   Not on file  Social History Narrative    Married 8 years- divorced; remarried 1994 Optometrist)   Forensic psychologist    Work; Film/video editor; 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    Plays golf, remains very active with an interest in politics. Jan 2011 moved to area.    Involved in [employer].    He is a runner - has run [employer] and [employer]. S/p torn meniscus.    Social Determinants of Health   Financial Resource Strain: Low Risk  (04/28/2020)   Overall Financial Resource Strain (CARDIA)    Difficulty of Paying Living Expenses: Not hard at all  Food Insecurity: No Food Insecurity (04/28/2020)   Hunger Vital Sign    Worried About Running Out of Food in the Last Year: Never true    Ran Out of Food in the Last Year: Never true  Transportation Needs: No Transportation Needs (04/28/2020)   PRAPARE - Hydrologist (Medical): No    Lack of Transportation (Non-Medical): No  Physical Activity: Sufficiently Active (04/28/2020)   Exercise Vital Sign    Days of Exercise per Week: 7 days    Minutes of Exercise per Session: 30 min  Stress: No Stress Concern Present (04/28/2020)   [place]    Feeling of Stress : Not at all  Social Connections: Unknown (12/11/2017)   Social Connection and Isolation Panel [NHANES]    Frequency of Communication with Friends and Family: Patient refused    Frequency of Social Gatherings with Friends and Family: Patient refused    Attends Religious Services: Patient refused    Active Member of Clubs or Organizations: Patient refused    Attends Archivist Meetings: Patient refused    Marital Status: Patient refused  Intimate Partner Violence: Not At Risk (04/28/2020)   Humiliation, Afraid, Rape, and Kick questionnaire    Fear of Current or Ex-Partner: No    Emotionally Abused: No    Physically Abused: No    Sexually Abused: No   Family History   Problem Relation Age of Onset   Coronary artery disease Father    Heart attack Father 26   Heart disease Father    Stroke Mother    Colon cancer Neg Hx    Esophageal cancer Neg Hx    Rectal cancer Neg Hx    Stomach cancer Neg Hx    Kidney cancer Neg Hx    Kidney failure Neg Hx    Prostate cancer Neg Hx    Tuberculosis Neg Hx     Current Outpatient Medications:  amLODipine (NORVASC) 5 MG tablet, Take 1 tablet (5 mg total) by mouth daily., Disp: 30 tablet, Rfl: 0   apixaban (ELIQUIS) 5 MG TABS tablet, Take 1 tablet (5 mg total) by mouth 2 (two) times daily. Please restart your Eliquis tomorrow morning, Disp: 60 tablet, Rfl: 5   Benzocaine (HURRICAINE) 20 % AERO, Use as directed 1 Application in the mouth or throat as needed., Disp: 57 g, Rfl: 3   Cholecalciferol (VITAMIN D3) 25 MCG (1000 UT) CAPS, Take 1 capsule by mouth daily., Disp: , Rfl:    dexamethasone (DECADRON) 4 MG tablet, Take 2 tablets (8 mg total) by mouth daily. Start the day after chemotherapy for 2 days., Disp: 30 tablet, Rfl: 1   docusate sodium (COLACE) 250 MG capsule, Takes total '600mg'$  /day, Disp: , Rfl:    fluocinonide gel (LIDEX) 0.05 %, , Disp: , Rfl:    FLUoxetine (PROZAC) 20 MG capsule, Take 1 capsule (20 mg total) by mouth daily., Disp: 30 capsule, Rfl: 6   furosemide (LASIX) 20 MG tablet, Take 1 tablet by mouth once daily, Disp: 30 tablet, Rfl: 2   levothyroxine (EUTHYROX) 50 MCG tablet, Take 1 tablet (50 mcg total) by mouth daily before breakfast., Disp: 30 tablet, Rfl: 8   lidocaine-prilocaine (EMLA) cream, Apply to affected area once, Disp: 30 g, Rfl: 3   MAGNESIUM PO, Take 1 tablet by mouth daily., Disp: , Rfl:    Multiple Vitamins-Minerals (ZINC PO), Take by mouth daily., Disp: , Rfl:    nitroGLYCERIN (NITROSTAT) 0.4 MG SL tablet, Place 1 tablet (0.4 mg total) under the tongue every 5 (five) minutes as needed for chest pain (Maximum 3 doses.)., Disp: 25 tablet, Rfl: 2   Omega-3 Fatty Acids (OMEGA 3 PO),  Take by mouth daily., Disp: , Rfl:    ondansetron (ZOFRAN) 8 MG tablet, Take 1 tablet (8 mg total) by mouth 2 (two) times daily as needed for refractory nausea / vomiting. Start on day 3 after chemo., Disp: 30 tablet, Rfl: 1   ondansetron (ZOFRAN-ODT) 4 MG disintegrating tablet, Take 1 tablet (4 mg total) by mouth every 8 (eight) hours as needed for nausea or vomiting., Disp: 20 tablet, Rfl: 0   polyethylene glycol (MIRALAX / GLYCOLAX) 17 g packet, Take 17 g by mouth daily as needed., Disp: , Rfl:    Potassium 99 MG TABS, Take 1 tablet by mouth daily., Disp: , Rfl:    prochlorperazine (COMPAZINE) 10 MG tablet, Take 1 tablet (10 mg total) by mouth every 6 (six) hours as needed (Nausea or vomiting)., Disp: 30 tablet, Rfl: 1   rosuvastatin (CRESTOR) 20 MG tablet, Take 1 tablet by mouth once daily, Disp: 90 tablet, Rfl: 3   traMADol (ULTRAM) 50 MG tablet, Take 1 tablet (50 mg total) by mouth every 6 (six) hours as needed., Disp: 30 tablet, Rfl: 0   VITAMIN A PO, Take 2,400 mcg by mouth daily. , Disp: , Rfl:    vitamin E 400 UNIT capsule, Take 400 Units by mouth daily., Disp: , Rfl:    zaleplon (SONATA) 10 MG capsule, Take 1 capsule (10 mg total) by mouth at bedtime as needed for sleep., Disp: 30 capsule, Rfl: 0   Zinc 50 MG CAPS, Take 50 mg by mouth daily., Disp: , Rfl:   Physical exam:  Vitals:   02/06/22 0840  BP: (!) 86/62  Pulse: 81  Resp: 16  Temp: 98.4 F (36.9 C)  TempSrc: Oral  SpO2: 95%  Weight: 170 lb (77.1 kg)  Physical Exam Constitutional:      General: He is not in acute distress.    Appearance: He is well-developed.     Comments: Thin build. Accompanied by wife. Ambulating w/o aids  HENT:     Head: Normocephalic and atraumatic.     Mouth/Throat:     Comments: Right buccal lesion Eyes:     General: No scleral icterus.    Conjunctiva/sclera: Conjunctivae normal.  Cardiovascular:     Rate and Rhythm: Normal rate. Rhythm irregular.     Pulses: Normal pulses.      Comments: Port accessed right chest Pulmonary:     Effort: Pulmonary effort is normal.     Breath sounds: Normal breath sounds. No wheezing.  Abdominal:     General: There is no distension.     Palpations: Abdomen is soft.     Tenderness: There is no abdominal tenderness.  Musculoskeletal:     Right lower leg: No edema.     Left lower leg: No edema.  Skin:    General: Skin is warm and dry.  Neurological:     Mental Status: He is alert and oriented to person, place, and time.  Psychiatric:        Mood and Affect: Mood normal.        Behavior: Behavior normal.         Latest Ref Rng & Units 02/06/2022    8:30 AM  CMP  Glucose 70 - 99 mg/dL 216   BUN 8 - 23 mg/dL 21   Creatinine 0.61 - 1.24 mg/dL 0.98   Sodium 135 - 145 mmol/L 138   Potassium 3.5 - 5.1 mmol/L 4.1   Chloride 98 - 111 mmol/L 103   CO2 22 - 32 mmol/L 27   Calcium 8.9 - 10.3 mg/dL 8.9   Total Protein 6.5 - 8.1 g/dL 6.2   Total Bilirubin 0.3 - 1.2 mg/dL 0.9   Alkaline Phos 38 - 126 U/L 55   AST 15 - 41 U/L 35   ALT 0 - 44 U/L 36       Latest Ref Rng & Units 02/06/2022    8:30 AM  CBC  WBC 4.0 - 10.5 K/uL 6.0   Hemoglobin 13.0 - 17.0 g/dL 14.5   Hematocrit 39.0 - 52.0 % 42.1   Platelets 150 - 400 K/uL 146     No images are attached to the encounter.  PERIPHERAL VASCULAR CATHETERIZATION  Result Date: 01/26/2022 See surgical note for result.  NM PET Image Initial (PI) Skull Base To Thigh  Result Date: 01/15/2022 CLINICAL DATA:  Initial treatment strategy for squamous cell carcinoma of the buccal mucosa. EXAM: NUCLEAR MEDICINE PET SKULL BASE TO THIGH TECHNIQUE: 8.8 mCi F-18 FDG was injected intravenously. Full-ring PET imaging was performed from the skull base to thigh after the radiotracer. CT data was obtained and used for attenuation correction and anatomic localization. Fasting blood glucose: 144 mg/dl COMPARISON:  PET-CT 10/25/2021. CT neck 01/30/2012 and abdominopelvic CT 03/13/2010. FINDINGS:  Mediastinal blood pool activity: SUV max 2.4 NECK: Intense focal hypermetabolic activity within the soft tissues lateral to the mandible on the right (SUV max 9.7), corresponding with an ill-defined soft tissue mass measuring approximately 3.4 x 2.2 cm on image 38/4. There is asymmetric hypermetabolic activity in the right lateral submandibular space at the level of the angle of the mandible (SUV max 4.5), suspicious for a 1B nodal metastasis. No enlarged cervical lymph nodes are identified. However, there are small hypermetabolic level III  bilaterally (SUV max 3.9 on the right and 3.5 on the left).No other definite lesions of the pharyngeal mucosal space. Activity within the lymphoid tissue of Waldeyer's ring is within physiologic limits. Probable left mandibular dental caries. Incidental CT findings: Bilateral carotid atherosclerosis. As above, probable left mandibular dental caries. CHEST: There are no hypermetabolic mediastinal, hilar or axillary lymph nodes. No hypermetabolic pulmonary activity or suspicious nodularity. Incidental CT findings: Status post median sternotomy and CABG. There is diffuse atherosclerosis of the aorta, great vessels and coronary arteries. Aortic valvular calcifications are present. There is a small hiatal hernia. The lungs are clear. ABDOMEN/PELVIS: There is no hypermetabolic activity within the liver, adrenal glands, spleen or pancreas. There is no hypermetabolic nodal activity. Incidental CT findings: Similar low-density renal lesions bilaterally without hypermetabolic activity, consistent with incidental cysts; no follow-up imaging recommended. Aortic atherosclerosis. Distal colonic diverticulosis. SKELETON: There is no hypermetabolic activity to suggest osseous metastatic disease. No gross mandibular osseous destruction. Facial assessment is mildly limited by beam hardening artifact from the patient's dental amalgam. Incidental CT findings: none IMPRESSION: 1. Intensely  hypermetabolic right mandibular buccal soft tissue mass consistent with known squamous cell carcinoma. Suspected small nodal metastases involving level 1B on the right and level III bilaterally. 2. No evidence of distant metastatic disease. 3. Carotid, coronary and Aortic Atherosclerosis (ICD10-I70.0). Electronically Signed   By: Richardean Sale M.D.   On: 01/15/2022 11:16     Assessment and plan- Patient is a 81 y.o. male with   Recurrent squamous cell carcinoma - stage III T3 N1 M0. Initially T1 tumor in March 2023 s/p radiation. Short recurrence with PET scans concerning for lymph node involvement. Recommendation for concurrent chemo-radiation given rapidly increasing mass. Consider immunotherapy if he has residual disease post chemo-rad. Given intolerance, hearing loss, thrombocytopenia with cisplatin, recommendation for carbo AUC 2 with taxol 50 mg/m2. Tolerated cycle 1 well without significant side effects. Labs reviewed and acceptable for continuation of treatment.  Weight loss- followed by Jennet Maduro, RD. Considering PEG.  Labile blood pressures- likely secondary to poor intake and a fib. Lasix held. Amlodipine was decreased to 5 mg by cardiology. Continue IV fluids twice weekly. Given a fib, recommend checking manual blood pressure today which was improved (122/86). Continue amlodipine.  Port-a-cath- functioning well.  A fib- persistent a fib s/p cardioversion. On indefinite anticoagulation with apixaban. Saw cardiology last week. EKG confirmed afib. Continue apixaban.  Heart failure- history of heart failure. Monitor closely for fluid overload.  Neoplasm related pain- currently on tramadol. Stable.  Goals of care- treatment given with curative intent. Followed by palliative care for ongoing symptom management.   Disposition: Check manual blood pressure today Carbo-taxol today Additional liter of fluids today Fluids later this week.  1 week- port/ labs (cbc, cmp), Dr. Janese Banks, +/-  carbo-taxol- la  Visit Diagnosis 1. Squamous cell carcinoma of oral cavity (HCC)   2. Encounter for antineoplastic chemotherapy    Beckey Rutter, DNP, AGNP-C [place] [facility] [phone] (clinic) 02/06/2022  CC: Dr. Janese Banks

## 2022-02-07 ENCOUNTER — Other Ambulatory Visit: Payer: Self-pay

## 2022-02-07 ENCOUNTER — Ambulatory Visit: Payer: PPO

## 2022-02-07 ENCOUNTER — Ambulatory Visit
Admission: RE | Admit: 2022-02-07 | Discharge: 2022-02-07 | Disposition: A | Discharge status: Home / Self Care | Payer: PPO | Source: Ambulatory Visit | Attending: Radiation Oncology | Admitting: Radiation Oncology

## 2022-02-07 DIAGNOSIS — C4402 Squamous cell carcinoma of skin of lip: Secondary | ICD-10-CM | POA: Diagnosis not present

## 2022-02-07 DIAGNOSIS — Z5111 Encounter for antineoplastic chemotherapy: Secondary | ICD-10-CM | POA: Diagnosis not present

## 2022-02-07 LAB — RAD ONC ARIA SESSION SUMMARY
Course Elapsed Days: 8
Plan Fractions Treated to Date: 7
Plan Prescribed Dose Per Fraction: 2 Gy
Plan Total Fractions Prescribed: 25
Plan Total Prescribed Dose: 50 Gy
Reference Point Dosage Given to Date: 14 Gy
Reference Point Session Dosage Given: 2 Gy
Session Number: 7

## 2022-02-08 ENCOUNTER — Other Ambulatory Visit: Payer: Self-pay

## 2022-02-08 ENCOUNTER — Ambulatory Visit
Admission: RE | Admit: 2022-02-08 | Discharge: 2022-02-08 | Disposition: A | Discharge status: Home / Self Care | Payer: PPO | Source: Ambulatory Visit | Attending: Radiation Oncology | Admitting: Radiation Oncology

## 2022-02-08 ENCOUNTER — Ambulatory Visit: Payer: PPO

## 2022-02-08 DIAGNOSIS — Z5111 Encounter for antineoplastic chemotherapy: Secondary | ICD-10-CM | POA: Diagnosis not present

## 2022-02-08 DIAGNOSIS — C4402 Squamous cell carcinoma of skin of lip: Secondary | ICD-10-CM | POA: Diagnosis not present

## 2022-02-08 LAB — RAD ONC ARIA SESSION SUMMARY
Course Elapsed Days: 9
Plan Fractions Treated to Date: 8
Plan Prescribed Dose Per Fraction: 2 Gy
Plan Total Fractions Prescribed: 25
Plan Total Prescribed Dose: 50 Gy
Reference Point Dosage Given to Date: 16 Gy
Reference Point Session Dosage Given: 2 Gy
Session Number: 8

## 2022-02-09 ENCOUNTER — Inpatient Hospital Stay: Payer: PPO

## 2022-02-09 ENCOUNTER — Other Ambulatory Visit: Payer: Self-pay

## 2022-02-09 ENCOUNTER — Ambulatory Visit: Payer: PPO

## 2022-02-09 ENCOUNTER — Ambulatory Visit
Admission: RE | Admit: 2022-02-09 | Discharge: 2022-02-09 | Disposition: A | Discharge status: Home / Self Care | Payer: PPO | Source: Ambulatory Visit | Attending: Radiation Oncology | Admitting: Radiation Oncology

## 2022-02-09 VITALS — BP 105/67 | HR 85 | Resp 17

## 2022-02-09 DIAGNOSIS — E860 Dehydration: Secondary | ICD-10-CM

## 2022-02-09 DIAGNOSIS — C069 Malignant neoplasm of mouth, unspecified: Secondary | ICD-10-CM

## 2022-02-09 DIAGNOSIS — Z5111 Encounter for antineoplastic chemotherapy: Secondary | ICD-10-CM | POA: Diagnosis not present

## 2022-02-09 DIAGNOSIS — C4402 Squamous cell carcinoma of skin of lip: Secondary | ICD-10-CM | POA: Diagnosis not present

## 2022-02-09 LAB — RAD ONC ARIA SESSION SUMMARY
Course Elapsed Days: 10
Plan Fractions Treated to Date: 9
Plan Prescribed Dose Per Fraction: 2 Gy
Plan Total Fractions Prescribed: 25
Plan Total Prescribed Dose: 50 Gy
Reference Point Dosage Given to Date: 18 Gy
Reference Point Session Dosage Given: 2 Gy
Session Number: 9

## 2022-02-09 MED ORDER — SODIUM CHLORIDE 0.9 % IV SOLN
Freq: Once | INTRAVENOUS | Status: AC
  Filled 2022-02-09: qty 250

## 2022-02-09 MED ORDER — SODIUM CHLORIDE 0.9% FLUSH
10.0000 mL | Freq: Once | INTRAVENOUS | Status: AC
  Administered 2022-02-09: 10 mL via INTRAVENOUS
  Filled 2022-02-09: qty 10

## 2022-02-09 MED ORDER — HEPARIN SOD (PORK) LOCK FLUSH 100 UNIT/ML IV SOLN
500.0000 [IU] | Freq: Once | INTRAVENOUS | Status: AC
  Administered 2022-02-09: 500 [IU] via INTRAVENOUS
  Filled 2022-02-09: qty 5

## 2022-02-10 ENCOUNTER — Ambulatory Visit: Payer: PPO

## 2022-02-10 ENCOUNTER — Ambulatory Visit: Admission: RE | Admit: 2022-02-10 | Payer: PPO | Source: Ambulatory Visit

## 2022-02-10 ENCOUNTER — Ambulatory Visit
Admission: RE | Admit: 2022-02-10 | Discharge: 2022-02-10 | Disposition: A | Discharge status: Home / Self Care | Payer: PPO | Source: Ambulatory Visit | Attending: Radiation Oncology | Admitting: Radiation Oncology

## 2022-02-10 ENCOUNTER — Other Ambulatory Visit: Payer: Self-pay

## 2022-02-10 DIAGNOSIS — C4402 Squamous cell carcinoma of skin of lip: Secondary | ICD-10-CM | POA: Diagnosis not present

## 2022-02-10 DIAGNOSIS — Z5111 Encounter for antineoplastic chemotherapy: Secondary | ICD-10-CM | POA: Diagnosis not present

## 2022-02-10 LAB — RAD ONC ARIA SESSION SUMMARY
Course Elapsed Days: 11
Plan Fractions Treated to Date: 10
Plan Prescribed Dose Per Fraction: 2 Gy
Plan Total Fractions Prescribed: 25
Plan Total Prescribed Dose: 50 Gy
Reference Point Dosage Given to Date: 20 Gy
Reference Point Session Dosage Given: 2 Gy
Session Number: 10

## 2022-02-10 MED FILL — Dexamethasone Sodium Phosphate Inj 100 MG/10ML: INTRAMUSCULAR | Qty: 1 | Status: AC

## 2022-02-13 ENCOUNTER — Inpatient Hospital Stay (HOSPITAL_BASED_OUTPATIENT_CLINIC_OR_DEPARTMENT_OTHER): Payer: PPO | Admitting: Oncology

## 2022-02-13 ENCOUNTER — Ambulatory Visit
Admission: RE | Admit: 2022-02-13 | Discharge: 2022-02-13 | Disposition: A | Discharge status: Home / Self Care | Payer: PPO | Source: Ambulatory Visit | Attending: Radiation Oncology | Admitting: Radiation Oncology

## 2022-02-13 ENCOUNTER — Ambulatory Visit: Payer: PPO

## 2022-02-13 ENCOUNTER — Inpatient Hospital Stay: Payer: PPO

## 2022-02-13 ENCOUNTER — Other Ambulatory Visit: Payer: Self-pay

## 2022-02-13 ENCOUNTER — Encounter: Payer: Self-pay | Admitting: Oncology

## 2022-02-13 VITALS — BP 115/66 | HR 113 | Temp 98.4°F | Resp 18 | Wt 175.1 lb

## 2022-02-13 VITALS — HR 79

## 2022-02-13 DIAGNOSIS — Z5111 Encounter for antineoplastic chemotherapy: Secondary | ICD-10-CM

## 2022-02-13 DIAGNOSIS — C4402 Squamous cell carcinoma of skin of lip: Secondary | ICD-10-CM | POA: Diagnosis not present

## 2022-02-13 DIAGNOSIS — C060 Malignant neoplasm of cheek mucosa: Secondary | ICD-10-CM

## 2022-02-13 DIAGNOSIS — C069 Malignant neoplasm of mouth, unspecified: Secondary | ICD-10-CM

## 2022-02-13 LAB — RAD ONC ARIA SESSION SUMMARY
Course Elapsed Days: 14
Plan Fractions Treated to Date: 1
Plan Prescribed Dose Per Fraction: 2 Gy
Plan Total Fractions Prescribed: 15
Plan Total Prescribed Dose: 30 Gy
Reference Point Dosage Given to Date: 22 Gy
Reference Point Session Dosage Given: 2 Gy
Session Number: 11

## 2022-02-13 LAB — CBC WITH DIFFERENTIAL/PLATELET
Abs Immature Granulocytes: 0.01 10*3/uL (ref 0.00–0.07)
Basophils Absolute: 0 10*3/uL (ref 0.0–0.1)
Basophils Relative: 0 %
Eosinophils Absolute: 0 10*3/uL (ref 0.0–0.5)
Eosinophils Relative: 1 %
HCT: 41.4 % (ref 39.0–52.0)
Hemoglobin: 14.1 g/dL (ref 13.0–17.0)
Immature Granulocytes: 0 %
Lymphocytes Relative: 23 %
Lymphs Abs: 0.9 10*3/uL (ref 0.7–4.0)
MCH: 33.3 pg (ref 26.0–34.0)
MCHC: 34.1 g/dL (ref 30.0–36.0)
MCV: 97.6 fL (ref 80.0–100.0)
Monocytes Absolute: 0.2 10*3/uL (ref 0.1–1.0)
Monocytes Relative: 5 %
Neutro Abs: 2.7 10*3/uL (ref 1.7–7.7)
Neutrophils Relative %: 71 %
Platelets: 164 10*3/uL (ref 150–400)
RBC: 4.24 MIL/uL (ref 4.22–5.81)
RDW: 12 % (ref 11.5–15.5)
WBC: 3.8 10*3/uL — ABNORMAL LOW (ref 4.0–10.5)
nRBC: 0 % (ref 0.0–0.2)

## 2022-02-13 LAB — COMPREHENSIVE METABOLIC PANEL
ALT: 57 U/L — ABNORMAL HIGH (ref 0–44)
AST: 33 U/L (ref 15–41)
Albumin: 3.5 g/dL (ref 3.5–5.0)
Alkaline Phosphatase: 52 U/L (ref 38–126)
Anion gap: 8 (ref 5–15)
BUN: 20 mg/dL (ref 8–23)
CO2: 27 mmol/L (ref 22–32)
Calcium: 8.7 mg/dL — ABNORMAL LOW (ref 8.9–10.3)
Chloride: 103 mmol/L (ref 98–111)
Creatinine, Ser: 0.75 mg/dL (ref 0.61–1.24)
GFR, Estimated: >60 mL/min (ref >60)
Glucose, Bld: 183 mg/dL — ABNORMAL HIGH (ref 70–99)
Potassium: 3.8 mmol/L (ref 3.5–5.1)
Sodium: 138 mmol/L (ref 135–145)
Total Bilirubin: 0.9 mg/dL (ref 0.3–1.2)
Total Protein: 6 g/dL — ABNORMAL LOW (ref 6.5–8.1)

## 2022-02-13 MED ORDER — PREGABALIN 75 MG PO CAPS: 75.0000 mg | ORAL_CAPSULE | Freq: Two times a day (BID) | ORAL | 3 refills | Status: AC

## 2022-02-13 MED ORDER — DIPHENHYDRAMINE HCL 50 MG/ML IJ SOLN
50.0000 mg | Freq: Once | INTRAMUSCULAR | Status: AC
  Administered 2022-02-13: 50 mg via INTRAVENOUS
  Filled 2022-02-13: qty 1

## 2022-02-13 MED ORDER — SODIUM CHLORIDE 0.9 % IV SOLN
178.0000 mg | Freq: Once | INTRAVENOUS | Status: AC
  Administered 2022-02-13: 180 mg via INTRAVENOUS
  Filled 2022-02-13: qty 18

## 2022-02-13 MED ORDER — PALONOSETRON HCL INJECTION 0.25 MG/5ML
0.2500 mg | Freq: Once | INTRAVENOUS | Status: AC
  Administered 2022-02-13: 0.25 mg via INTRAVENOUS
  Filled 2022-02-13: qty 5

## 2022-02-13 MED ORDER — SODIUM CHLORIDE 0.9 % IV SOLN
Freq: Once | INTRAVENOUS | Status: AC
  Filled 2022-02-13: qty 250

## 2022-02-13 MED ORDER — HEPARIN SOD (PORK) LOCK FLUSH 100 UNIT/ML IV SOLN
500.0000 [IU] | Freq: Once | INTRAVENOUS | Status: DC | PRN
  Filled 2022-02-13: qty 5

## 2022-02-13 MED ORDER — HEPARIN SOD (PORK) LOCK FLUSH 100 UNIT/ML IV SOLN
500.0000 [IU] | Freq: Once | INTRAVENOUS | Status: AC
  Administered 2022-02-13: 500 [IU] via INTRAVENOUS
  Filled 2022-02-13: qty 5

## 2022-02-13 MED ORDER — PREGABALIN 75 MG PO CAPS: 75.0000 mg | ORAL_CAPSULE | Freq: Two times a day (BID) | ORAL | 3 refills | Status: DC

## 2022-02-13 MED ORDER — SODIUM CHLORIDE 0.9 % IV SOLN: 214.6000 mg | Freq: Once | INTRAVENOUS | Status: DC

## 2022-02-13 MED ORDER — SODIUM CHLORIDE 0.9 % IV SOLN
10.0000 mg | Freq: Once | INTRAVENOUS | Status: AC
  Administered 2022-02-13: 10 mg via INTRAVENOUS
  Filled 2022-02-13: qty 10

## 2022-02-13 MED ORDER — SODIUM CHLORIDE 0.9 % IV SOLN
50.0000 mg/m2 | Freq: Once | INTRAVENOUS | Status: AC
  Administered 2022-02-13: 102 mg via INTRAVENOUS
  Filled 2022-02-13: qty 17

## 2022-02-13 MED ORDER — FAMOTIDINE IN NACL 20-0.9 MG/50ML-% IV SOLN
20.0000 mg | Freq: Once | INTRAVENOUS | Status: AC
  Administered 2022-02-13: 20 mg via INTRAVENOUS
  Filled 2022-02-13: qty 50

## 2022-02-13 MED ORDER — SODIUM CHLORIDE 0.9% FLUSH
10.0000 mL | Freq: Once | INTRAVENOUS | Status: AC
  Administered 2022-02-13: 10 mL via INTRAVENOUS
  Filled 2022-02-13: qty 10

## 2022-02-13 NOTE — Progress Notes (Signed)
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  He presented to ENT with symptoms of her right cheek mucosal irritation which did not get better after topical remedies.  This was ultimately biopsied by Dr. Jenne Campus and came back positive for squamous cell carcinoma while differentiated and keratinizing.  This has not been deemed to be resectable by ENT and therefore referred for consideration of radiation or chemotherapy.  PET CT scan did not show anyEvidence of locoregional or distant metastatic disease.  Patient received definitive radiation to this area.  A few weeks after radiation was completed patient developed a rapidly growing mass anterior to his original site of disease involvement.   He had a repeat biopsy by ENT which showed squamous cell carcinoma with basaloid features and minimal focal keratinization.  Repeat PET CT scan showed3.4 x 2.2 cm ill-defined soft tissue mass within the soft tissues lateral to the mandible on the right side.  Hypermetabolic activity in the right lateral  submandibular space at the level of the angle of mandible with an SUV of 4.5 suspicious for 1B nodal metastases.  Small hypermetabolic level 3 activity bilaterally with an SUV of 3.9 and 3.5 respectively concerning for possible disease involvement.  No evidence of distant metastatic disease.       Interval history-he still has considerable pain at the site of tumor but does not wish to take anything more than as needed Tylenol.  He is fearful about trying tramadol or other opioids.  States that he had reaction to morphine in the past.  He has not been able to brush his teeth because of soreness.  ECOG PS- 1 Pain scale- 4 Opioid associated constipation- no  Review of systems- Review of Systems  HENT:         Pain over ulcerated lesion in his left      Allergies  Allergen Reactions   Ace Inhibitors Swelling and Other (See Comments)    Possible angioedema (upper lip swelling)   Morphine And Related Swelling    SWELLING REACTION UNSPECIFIED  [severity rated per PMH, 12/11/2017]     Past Medical History:  Diagnosis Date   Anxiety    Basal cell carcinoma 09/12/2018   Left forehead above lateral brow. Nodular pattern   Cancer (HCC)    Coronary artery disease 2003   a. nonobstructive CAD by cath in 2003 and 2005; b.  Status post three-vessel CABG on 12/12/2017 with LIMA to LAD, sequential reverse SVG to OM1 and distal LCx   COVID-19 03/2020   Diverticulosis    Hearing loss    bilateral   Heart disease    Heart murmur    History of benign prostatic hypertrophy  History of radiation therapy 01/31/10-03/22/10   right tonsil/right neck node 7000 cGy 35 sessions, high risk lymph node volume 5940 cGy 35 sessions, low risk lymph node vol 5600 cGy 35 sessions   HPV in male    positive   HTN (hypertension)    Hyperlipidemia    diet controlled- taking medication d/t ensure drinking   Hypothyroid 01/16/2013   Ischemic cardiomyopathy    a. 10/2017: echo showing reduced EF of 40-45%, HK of  the anterior, anteroseptal and apical myocardium with Grade 1 DD.    Neck pain    Persistent atrial fibrillation (HCC)    Primary squamous cell carcinoma of skin of lip 2023   lip and tonuge   Squamous cell carcinoma    right tonsil- 2011   Squamous cell carcinoma of skin 09/02/2019   Crown scalp. WD SCC   Squamous cell carcinoma of skin 09/02/2019   Left distal lateral deltoid. SCCis arising in SK   Thrombocytopenia (HCC) 02/02/2012   unclear etiology   Tinnitus    Tubular adenoma of colon      Past Surgical History:  Procedure Laterality Date   artoscopic knee     CARDIAC CATHETERIZATION  2003, 2005   40% blockage, two 30% blockages treated medically Myrtis Ser)   CARDIOVERSION N/A 02/12/2018   Procedure: CARDIOVERSION;  Surgeon: Yvonne Kendall, MD;  Location: [facility] ORS;  Service: Cardiovascular;  Laterality: N/A;   CARDIOVERSION N/A 05/10/2021   Procedure: CARDIOVERSION;  Surgeon: Yvonne Kendall, MD;  Location: [facility] ORS;  Service: Cardiovascular;  Laterality: N/A;   COLONOSCOPY  03/2015   TA x3, HP, melanosis coli, severe diverticulosis, rpt 3 yrs (Pyrtle)   COLONOSCOPY  01/2019   fair prep, diverticulosis, f/u PRN (Pyrtle)   CORONARY ARTERY BYPASS GRAFT N/A 12/12/2017   Procedure: CORONARY ARTERY BYPASS GRAFTING (CABG) x3 using the right greater saphenous vein harvested endoscopically and the left internal mammary artery. LIMA to LAD, SEQ SVG to OM1 & OM2;  Surgeon: Delight Ovens, MD;  Location: [facility];  Service: Open Heart Surgery;  Laterality: N/A;   DENTAL SURGERY     extractions   IR THORACENTESIS ASP PLEURAL SPACE W/IMG GUIDE  01/17/2018   KNEE ARTHROSCOPY Left    LEFT HEART CATH AND CORONARY ANGIOGRAPHY N/A 12/10/2017   Procedure: LEFT HEART CATH AND CORONARY ANGIOGRAPHY;  Surgeon: Yvonne Kendall, MD;  Location: [facility] INVASIVE CV LAB;  Service: Cardiovascular;  Laterality: N/A;   PEG PLACEMENT  02/2010   PORTA CATH INSERTION N/A 01/26/2022   Procedure: PORTA CATH  INSERTION;  Surgeon: Renford Dills, MD;  Location: [facility] INVASIVE CV LAB;  Service: Cardiovascular;  Laterality: N/A;   TEE WITHOUT CARDIOVERSION N/A 12/12/2017   Procedure: TRANSESOPHAGEAL ECHOCARDIOGRAM (TEE);  Surgeon: Delight Ovens, MD;  Location: [facility] OR;  Service: Open Heart Surgery;  Laterality: N/A;   TONSILLECTOMY     triple bypass  11/2017   VASECTOMY      Social History   Socioeconomic History   Marital status: Married    Spouse name: Garment/textile technologist   Number of children: 1   Years of education: Not on file   Highest education level: Not on file  Occupational History   Not on file  Tobacco Use   Smoking status: Never   Smokeless tobacco: Never  Vaping Use   Vaping Use: Never used  Substance and Sexual Activity   Alcohol use: Not Currently    Comment: glass of wine 1-2 times a month   Drug use:  No   Sexual activity: Yes  Other Topics Concern   Not on file  Social History Narrative   Married 8 years- divorced; remarried 1994 Geneticist, molecular)   Engineer, maintenance (IT)    Work; Charity fundraiser; 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    Plays golf, remains very active with an interest in politics. Jan 2011 moved to area.    Involved in [employer].    He is a runner - has run [employer] and [employer]. S/p torn meniscus.    Social Determinants of Health   Financial Resource Strain: Low Risk  (04/28/2020)   Overall Financial Resource Strain (CARDIA)    Difficulty of Paying Living Expenses: Not hard at all  Food Insecurity: No Food Insecurity (04/28/2020)   Hunger Vital Sign    Worried About Running Out of Food in the Last Year: Never true    Ran Out of Food in the Last Year: Never true  Transportation Needs: No Transportation Needs (04/28/2020)   PRAPARE - Administrator, Civil Service (Medical): No    Lack of Transportation (Non-Medical): No  Physical Activity: Sufficiently Active (04/28/2020)   Exercise Vital  Sign    Days of Exercise per Week: 7 days    Minutes of Exercise per Session: 30 min  Stress: No Stress Concern Present (04/28/2020)   [employer] of Occupational Health - Occupational Stress Questionnaire    Feeling of Stress : Not at all  Social Connections: Unknown (12/11/2017)   Social Connection and Isolation Panel [NHANES]    Frequency of Communication with Friends and Family: Patient refused    Frequency of Social Gatherings with Friends and Family: Patient refused    Attends Religious Services: Patient refused    Active Member of Clubs or Organizations: Patient refused    Attends Banker Meetings: Patient refused    Marital Status: Patient refused  Intimate Partner Violence: Not At Risk (04/28/2020)   Humiliation, Afraid, Rape, and Kick questionnaire    Fear of Current or Ex-Partner: No    Emotionally Abused: No    Physically Abused: No    Sexually Abused: No    Family History  Problem Relation Age of Onset   Coronary artery disease Father    Heart attack Father 35   Heart disease Father    Stroke Mother    Colon cancer Neg Hx    Esophageal cancer Neg Hx    Rectal cancer Neg Hx    Stomach cancer Neg Hx    Kidney cancer Neg Hx    Kidney failure Neg Hx    Prostate cancer Neg Hx    Tuberculosis Neg Hx      Current Outpatient Medications:    amLODipine (NORVASC) 5 MG tablet, Take 1 tablet (5 mg total) by mouth daily., Disp: 30 tablet, Rfl: 0   apixaban (ELIQUIS) 5 MG TABS tablet, Take 1 tablet (5 mg total) by mouth 2 (two) times daily. Please restart your Eliquis tomorrow morning, Disp: 60 tablet, Rfl: 5   Benzocaine (HURRICAINE) 20 % AERO, Use as directed 1 Application in the mouth or throat as needed., Disp: 57 g, Rfl: 3   Cholecalciferol (VITAMIN D3) 25 MCG (1000 UT) CAPS, Take 1 capsule by mouth daily., Disp: , Rfl:    dexamethasone (DECADRON) 4 MG tablet, Take 2 tablets (8 mg total) by mouth daily. Start the day after chemotherapy for 2 days., Disp:  30 tablet, Rfl: 1   docusate  sodium (COLACE) 250 MG capsule, Takes total 600mg  /day, Disp: , Rfl:    fluocinonide gel (LIDEX) 0.05 %, , Disp: , Rfl:    FLUoxetine (PROZAC) 20 MG capsule, Take 1 capsule (20 mg total) by mouth daily., Disp: 30 capsule, Rfl: 6   furosemide (LASIX) 20 MG tablet, Take 1 tablet by mouth once daily, Disp: 30 tablet, Rfl: 2   levothyroxine (EUTHYROX) 50 MCG tablet, Take 1 tablet (50 mcg total) by mouth daily before breakfast., Disp: 30 tablet, Rfl: 8   lidocaine-prilocaine (EMLA) cream, Apply to affected area once, Disp: 30 g, Rfl: 3   MAGNESIUM PO, Take 1 tablet by mouth daily., Disp: , Rfl:    Multiple Vitamins-Minerals (ZINC PO), Take by mouth daily., Disp: , Rfl:    Omega-3 Fatty Acids (OMEGA 3 PO), Take by mouth daily., Disp: , Rfl:    ondansetron (ZOFRAN) 8 MG tablet, Take 1 tablet (8 mg total) by mouth 2 (two) times daily as needed for refractory nausea / vomiting. Start on day 3 after chemo., Disp: 30 tablet, Rfl: 1   ondansetron (ZOFRAN-ODT) 4 MG disintegrating tablet, Take 1 tablet (4 mg total) by mouth every 8 (eight) hours as needed for nausea or vomiting., Disp: 20 tablet, Rfl: 0   polyethylene glycol (MIRALAX / GLYCOLAX) 17 g packet, Take 17 g by mouth daily as needed., Disp: , Rfl:    Potassium 99 MG TABS, Take 1 tablet by mouth daily., Disp: , Rfl:    pregabalin (LYRICA) 75 MG capsule, Take 1 capsule (75 mg total) by mouth 2 (two) times daily., Disp: 56 capsule, Rfl: 3   rosuvastatin (CRESTOR) 20 MG tablet, Take 1 tablet by mouth once daily, Disp: 90 tablet, Rfl: 3   traMADol (ULTRAM) 50 MG tablet, Take 1 tablet (50 mg total) by mouth every 6 (six) hours as needed., Disp: 30 tablet, Rfl: 0   VITAMIN A PO, Take 2,400 mcg by mouth daily. , Disp: , Rfl:    vitamin E 400 UNIT capsule, Take 400 Units by mouth daily., Disp: , Rfl:    zaleplon (SONATA) 10 MG capsule, Take 1 capsule (10 mg total) by mouth at bedtime as needed for sleep., Disp: 30 capsule, Rfl: 0    Zinc 50 MG CAPS, Take 50 mg by mouth daily., Disp: , Rfl:    nitroGLYCERIN (NITROSTAT) 0.4 MG SL tablet, Place 1 tablet (0.4 mg total) under the tongue every 5 (five) minutes as needed for chest pain (Maximum 3 doses.). (Patient not taking: Reported on 02/13/2022), Disp: 25 tablet, Rfl: 2   prochlorperazine (COMPAZINE) 10 MG tablet, Take 1 tablet (10 mg total) by mouth every 6 (six) hours as needed (Nausea or vomiting). (Patient not taking: Reported on 02/13/2022), Disp: 30 tablet, Rfl: 1 No current facility-administered medications for this visit.  Facility-Administered Medications Ordered in Other Visits:    CARBOplatin (PARAPLATIN) 180 mg in sodium chloride 0.9 % 100 mL chemo infusion, 180 mg, Intravenous, Once, Creig Hines, MD, Last Rate: 236 mL/hr at 02/13/22 1139, 180 mg at 02/13/22 1139   [COMPLETED] heparin lock flush 100 unit/mL, 500 Units, Intravenous, Once, Creig Hines, MD, 500 Units at 02/13/22 1215   heparin lock flush 100 unit/mL, 500 Units, Intracatheter, Once PRN, Creig Hines, MD  Physical exam:  Vitals:   02/13/22 0857  BP: 115/66  Pulse: (!) 113  Resp: 18  Temp: 98.4 F (36.9 C)  SpO2: 96%  Weight: 175 lb 1.6 oz (79.4 kg)   Physical Exam  HENT:     Mouth/Throat:     Mouth: Mucous membranes are moist.     Pharynx: Oropharynx is clear.     Comments: Large irregular lesion seen over lower right lip Cardiovascular:     Rate and Rhythm: Normal rate and regular rhythm.     Heart sounds: Normal heart sounds.  Pulmonary:     Effort: Pulmonary effort is normal.     Breath sounds: Normal breath sounds.  Musculoskeletal:     Cervical back: Normal range of motion.  Skin:    General: Skin is warm and dry.  Neurological:     Mental Status: He is alert and oriented to person, place, and time.         Latest Ref Rng & Units 02/13/2022    8:44 AM  CMP  Glucose 70 - 99 mg/dL 161   BUN 8 - 23 mg/dL 20   Creatinine 0.96 - 1.24 mg/dL 0.45   Sodium 409 - 811 mmol/L  138   Potassium 3.5 - 5.1 mmol/L 3.8   Chloride 98 - 111 mmol/L 103   CO2 22 - 32 mmol/L 27   Calcium 8.9 - 10.3 mg/dL 8.7   Total Protein 6.5 - 8.1 g/dL 6.0   Total Bilirubin 0.3 - 1.2 mg/dL 0.9   Alkaline Phos 38 - 126 U/L 52   AST 15 - 41 U/L 33   ALT 0 - 44 U/L 57       Latest Ref Rng & Units 02/13/2022    8:44 AM  CBC  WBC 4.0 - 10.5 K/uL 3.8   Hemoglobin 13.0 - 17.0 g/dL 91.4   Hematocrit 78.2 - 52.0 % 41.4   Platelets 150 - 400 K/uL 164     No images are attached to the encounter.  PERIPHERAL VASCULAR CATHETERIZATION  Result Date: 01/26/2022 See surgical note for result.    Assessment and plan- Patient is a 81 y.o. male with recurrent squamous cell carcinoma stage III T3 N1 M0.  He is here for on treatment assessment prior to cycle 3 of weekly CarboTaxol chemotherapy concurrent with radiation.    Counts okay to proceed with cycle 3 of weekly CarboTaxol chemotherapy today.  He will directly proceed for cycle 4 next week and I will see him back in 2 weeks for cycle 5.  Neoplasm related pain: Patient is fearful to try tramadol or any other opioids.  I will send him a prescription for Lyrica 75 mg twice daily.  He will see NP Laurette Schimke next week.  He completes radiation treatment on 03/06/2022.  I plan is to give him 3 more chemo treatments including today.   Visit Diagnosis 1. Encounter for antineoplastic chemotherapy   2. Squamous cell carcinoma of buccal mucosa (HCC)      Dr. Owens Shark, MD, MPH [facility] at [facility] [phone] 02/13/2022 11:53 AM

## 2022-02-14 ENCOUNTER — Ambulatory Visit
Admission: RE | Admit: 2022-02-14 | Discharge: 2022-02-14 | Disposition: A | Discharge status: Home / Self Care | Payer: PPO | Source: Ambulatory Visit | Attending: Radiation Oncology | Admitting: Radiation Oncology

## 2022-02-14 ENCOUNTER — Ambulatory Visit: Payer: PPO

## 2022-02-14 ENCOUNTER — Other Ambulatory Visit: Payer: Self-pay

## 2022-02-14 DIAGNOSIS — Z5111 Encounter for antineoplastic chemotherapy: Secondary | ICD-10-CM | POA: Diagnosis not present

## 2022-02-14 DIAGNOSIS — C4402 Squamous cell carcinoma of skin of lip: Secondary | ICD-10-CM | POA: Diagnosis not present

## 2022-02-14 LAB — RAD ONC ARIA SESSION SUMMARY
Course Elapsed Days: 15
Plan Fractions Treated to Date: 2
Plan Prescribed Dose Per Fraction: 2 Gy
Plan Total Fractions Prescribed: 15
Plan Total Prescribed Dose: 30 Gy
Reference Point Dosage Given to Date: 24 Gy
Reference Point Session Dosage Given: 2 Gy
Session Number: 12

## 2022-02-15 ENCOUNTER — Inpatient Hospital Stay: Payer: PPO

## 2022-02-15 ENCOUNTER — Ambulatory Visit
Admission: RE | Admit: 2022-02-15 | Discharge: 2022-02-15 | Disposition: A | Discharge status: Home / Self Care | Payer: PPO | Source: Ambulatory Visit | Attending: Radiation Oncology | Admitting: Radiation Oncology

## 2022-02-15 ENCOUNTER — Ambulatory Visit: Payer: PPO

## 2022-02-15 ENCOUNTER — Other Ambulatory Visit: Payer: Self-pay

## 2022-02-15 DIAGNOSIS — C4402 Squamous cell carcinoma of skin of lip: Secondary | ICD-10-CM | POA: Diagnosis not present

## 2022-02-15 DIAGNOSIS — Z5111 Encounter for antineoplastic chemotherapy: Secondary | ICD-10-CM | POA: Diagnosis not present

## 2022-02-15 LAB — RAD ONC ARIA SESSION SUMMARY
Course Elapsed Days: 16
Plan Fractions Treated to Date: 3
Plan Prescribed Dose Per Fraction: 2 Gy
Plan Total Fractions Prescribed: 15
Plan Total Prescribed Dose: 30 Gy
Reference Point Dosage Given to Date: 26 Gy
Reference Point Session Dosage Given: 2 Gy
Session Number: 13

## 2022-02-15 NOTE — Progress Notes (Addendum)
Nutrition Follow-up:   Patient with recurrent squamous cell carcinoma of right buccal mucosa.  Completed radiation and had rapidly enlarging mass anterior to original site to develop.  Patient receiving radiation and chemotherapy.  Met with patient and wife following radiation.  Patient drinking equate plus (350 calorie) shake about 4 times a day mostly sometimes 6 or 7 times per day.  Wife has noticed this week less calories and unsure if associated with pain medication.  Other foods are limited due to lip tumor.  Says that he liked the glucerna 1.5 sample the least.  Has not tried boost [facility] yet.  Likes the equate plus.  Wife expressed wanting to learn about options for more assistance to take care of patient in the home.     Medications: reviewed  Labs: reviewed  Anthropometrics:   175 lb 1.6 oz on 6/26  170 lb on 6/12 182 lb per [facility] on 4/25 186 lb 7 oz on 4/4 per [facility] 193 lb on 09/02/21   NUTRITION DIAGNOSIS: Inadequate oral intake continues, relying on oral nutrition supplements   INTERVENTION:  Provided patient with options of where to purchase sample shakes (amazon, dmesupplyusa.com, [employer], product webstie).   Continue equate plus shake ideally would need 6-7 per day to better meet nutritional needs.  If drinks 500 calorie shake, would need ~5 per day.   Encouraged wife to discuss options about needing more in home care with Palliative care (upcoming meeting). RD will reach out to LCSW as well.   Complimentary case of ensure enlive left for patient to pick up tomorrow at radiation.    MONITORING, EVALUATION, GOAL: weight trends, intake   NEXT VISIT: Friday, July 14  Graylyn Bunney B. Zenia Resides, [place], [place] Registered Dietitian [phone]

## 2022-02-16 ENCOUNTER — Ambulatory Visit: Payer: PPO

## 2022-02-16 ENCOUNTER — Other Ambulatory Visit: Payer: Self-pay | Admitting: Hospice and Palliative Medicine

## 2022-02-16 ENCOUNTER — Ambulatory Visit
Admission: RE | Admit: 2022-02-16 | Discharge: 2022-02-16 | Disposition: A | Discharge status: Home / Self Care | Payer: PPO | Source: Ambulatory Visit | Attending: Radiation Oncology | Admitting: Radiation Oncology

## 2022-02-16 ENCOUNTER — Inpatient Hospital Stay: Payer: PPO

## 2022-02-16 ENCOUNTER — Other Ambulatory Visit: Payer: Self-pay

## 2022-02-16 VITALS — BP 134/84 | HR 83 | Temp 97.6°F

## 2022-02-16 DIAGNOSIS — Z5111 Encounter for antineoplastic chemotherapy: Secondary | ICD-10-CM | POA: Diagnosis not present

## 2022-02-16 DIAGNOSIS — C4402 Squamous cell carcinoma of skin of lip: Secondary | ICD-10-CM | POA: Diagnosis not present

## 2022-02-16 DIAGNOSIS — C069 Malignant neoplasm of mouth, unspecified: Secondary | ICD-10-CM

## 2022-02-16 LAB — RAD ONC ARIA SESSION SUMMARY
Course Elapsed Days: 17
Plan Fractions Treated to Date: 4
Plan Prescribed Dose Per Fraction: 2 Gy
Plan Total Fractions Prescribed: 15
Plan Total Prescribed Dose: 30 Gy
Reference Point Dosage Given to Date: 28 Gy
Reference Point Session Dosage Given: 2 Gy
Session Number: 14

## 2022-02-16 MED ORDER — HEPARIN SOD (PORK) LOCK FLUSH 100 UNIT/ML IV SOLN
500.0000 [IU] | Freq: Once | INTRAVENOUS | Status: AC
  Administered 2022-02-16: 500 [IU] via INTRAVENOUS
  Filled 2022-02-16: qty 5

## 2022-02-16 MED ORDER — SODIUM CHLORIDE 0.9% FLUSH
10.0000 mL | Freq: Once | INTRAVENOUS | Status: AC
  Administered 2022-02-16: 10 mL via INTRAVENOUS
  Filled 2022-02-16: qty 10

## 2022-02-16 MED ORDER — SODIUM CHLORIDE 0.9 % IV SOLN
Freq: Once | INTRAVENOUS | Status: AC
  Filled 2022-02-16: qty 250

## 2022-02-17 ENCOUNTER — Ambulatory Visit: Payer: PPO

## 2022-02-17 ENCOUNTER — Other Ambulatory Visit: Payer: Self-pay

## 2022-02-17 ENCOUNTER — Encounter: Payer: Self-pay | Admitting: Licensed Clinical Social Worker

## 2022-02-17 ENCOUNTER — Ambulatory Visit
Admission: RE | Admit: 2022-02-17 | Discharge: 2022-02-17 | Disposition: A | Discharge status: Home / Self Care | Payer: PPO | Source: Ambulatory Visit | Attending: Radiation Oncology | Admitting: Radiation Oncology

## 2022-02-17 DIAGNOSIS — Z5111 Encounter for antineoplastic chemotherapy: Secondary | ICD-10-CM | POA: Diagnosis not present

## 2022-02-17 DIAGNOSIS — C4402 Squamous cell carcinoma of skin of lip: Secondary | ICD-10-CM | POA: Diagnosis not present

## 2022-02-17 LAB — RAD ONC ARIA SESSION SUMMARY
Course Elapsed Days: 18
Plan Fractions Treated to Date: 5
Plan Prescribed Dose Per Fraction: 2 Gy
Plan Total Fractions Prescribed: 15
Plan Total Prescribed Dose: 30 Gy
Reference Point Dosage Given to Date: 30 Gy
Reference Point Session Dosage Given: 2 Gy
Session Number: 15

## 2022-02-17 NOTE — Progress Notes (Signed)
[place] Work  Clinical Social Work was referred by medical provider for assessment of psychosocial needs.  Clinical Social Worker attempted to contact patient by phone  to offer support and assess for needs.  CSW left voicemail for patient's caregiver/spouse Kadan Millstein [phone].  CSW left voicemail with contact information and request for return call.  Patient and caregiver met with nutritionist and Ms. Harriger mentioned wanting information on caregiver assistance at home.  Patient will meet with Palliative Care NP on Monday July 3rd,2023.  CSW updated PCNP and emailed the private duty caregiver list to give to Ms. Frisinger during their appointment.  CSW updated Ms. Bernat via voicemail that PCNP will have list for her during their visit.     Adelene Amas, [place] Worker [facility]

## 2022-02-20 ENCOUNTER — Other Ambulatory Visit: Payer: Self-pay

## 2022-02-20 ENCOUNTER — Ambulatory Visit
Admission: RE | Admit: 2022-02-20 | Discharge: 2022-02-20 | Disposition: A | Discharge status: Home / Self Care | Payer: PPO | Source: Ambulatory Visit | Attending: Radiation Oncology | Admitting: Radiation Oncology

## 2022-02-20 ENCOUNTER — Other Ambulatory Visit: Payer: Self-pay | Admitting: Oncology

## 2022-02-20 ENCOUNTER — Inpatient Hospital Stay: Payer: PPO

## 2022-02-20 ENCOUNTER — Ambulatory Visit: Payer: PPO

## 2022-02-20 ENCOUNTER — Inpatient Hospital Stay: Payer: PPO | Attending: Hospice and Palliative Medicine | Admitting: Hospice and Palliative Medicine

## 2022-02-20 VITALS — BP 114/79 | HR 65 | Temp 98.8°F | Resp 16

## 2022-02-20 VITALS — Wt 172.0 lb

## 2022-02-20 DIAGNOSIS — Z79899 Other long term (current) drug therapy: Secondary | ICD-10-CM | POA: Insufficient documentation

## 2022-02-20 DIAGNOSIS — C069 Malignant neoplasm of mouth, unspecified: Secondary | ICD-10-CM

## 2022-02-20 DIAGNOSIS — G893 Neoplasm related pain (acute) (chronic): Secondary | ICD-10-CM | POA: Diagnosis not present

## 2022-02-20 DIAGNOSIS — C060 Malignant neoplasm of cheek mucosa: Secondary | ICD-10-CM | POA: Insufficient documentation

## 2022-02-20 DIAGNOSIS — E440 Moderate protein-calorie malnutrition: Secondary | ICD-10-CM | POA: Diagnosis not present

## 2022-02-20 DIAGNOSIS — Z510 Encounter for antineoplastic radiation therapy: Secondary | ICD-10-CM | POA: Insufficient documentation

## 2022-02-20 DIAGNOSIS — Z5111 Encounter for antineoplastic chemotherapy: Secondary | ICD-10-CM | POA: Insufficient documentation

## 2022-02-20 DIAGNOSIS — C4402 Squamous cell carcinoma of skin of lip: Secondary | ICD-10-CM | POA: Diagnosis not present

## 2022-02-20 LAB — CBC WITH DIFFERENTIAL/PLATELET
Abs Immature Granulocytes: 0.02 10*3/uL (ref 0.00–0.07)
Basophils Absolute: 0 10*3/uL (ref 0.0–0.1)
Basophils Relative: 0 %
Eosinophils Absolute: 0 10*3/uL (ref 0.0–0.5)
Eosinophils Relative: 0 %
HCT: 42.1 % (ref 39.0–52.0)
Hemoglobin: 14.5 g/dL (ref 13.0–17.0)
Immature Granulocytes: 0 %
Lymphocytes Relative: 31 %
Lymphs Abs: 1.7 10*3/uL (ref 0.7–4.0)
MCH: 33.3 pg (ref 26.0–34.0)
MCHC: 34.4 g/dL (ref 30.0–36.0)
MCV: 96.8 fL (ref 80.0–100.0)
Monocytes Absolute: 0.4 10*3/uL (ref 0.1–1.0)
Monocytes Relative: 7 %
Neutro Abs: 3.3 10*3/uL (ref 1.7–7.7)
Neutrophils Relative %: 62 %
Platelets: 192 10*3/uL (ref 150–400)
RBC: 4.35 MIL/uL (ref 4.22–5.81)
RDW: 12.3 % (ref 11.5–15.5)
WBC: 5.4 10*3/uL (ref 4.0–10.5)
nRBC: 0 % (ref 0.0–0.2)

## 2022-02-20 LAB — RAD ONC ARIA SESSION SUMMARY
Course Elapsed Days: 21
Plan Fractions Treated to Date: 6
Plan Prescribed Dose Per Fraction: 2 Gy
Plan Total Fractions Prescribed: 15
Plan Total Prescribed Dose: 30 Gy
Reference Point Dosage Given to Date: 32 Gy
Reference Point Session Dosage Given: 2 Gy
Session Number: 16

## 2022-02-20 LAB — COMPREHENSIVE METABOLIC PANEL
ALT: 44 U/L (ref 0–44)
AST: 33 U/L (ref 15–41)
Albumin: 3.6 g/dL (ref 3.5–5.0)
Alkaline Phosphatase: 51 U/L (ref 38–126)
Anion gap: 9 (ref 5–15)
BUN: 18 mg/dL (ref 8–23)
CO2: 26 mmol/L (ref 22–32)
Calcium: 8.7 mg/dL — ABNORMAL LOW (ref 8.9–10.3)
Chloride: 100 mmol/L (ref 98–111)
Creatinine, Ser: 0.87 mg/dL (ref 0.61–1.24)
GFR, Estimated: >60 mL/min (ref >60)
Glucose, Bld: 179 mg/dL — ABNORMAL HIGH (ref 70–99)
Potassium: 3.8 mmol/L (ref 3.5–5.1)
Sodium: 135 mmol/L (ref 135–145)
Total Bilirubin: 1.1 mg/dL (ref 0.3–1.2)
Total Protein: 6.4 g/dL — ABNORMAL LOW (ref 6.5–8.1)

## 2022-02-20 MED ORDER — SODIUM CHLORIDE 0.9 % IV SOLN
10.0000 mg | Freq: Once | INTRAVENOUS | Status: AC
  Administered 2022-02-20: 10 mg via INTRAVENOUS
  Filled 2022-02-20: qty 10

## 2022-02-20 MED ORDER — DIPHENHYDRAMINE HCL 50 MG/ML IJ SOLN
50.0000 mg | Freq: Once | INTRAMUSCULAR | Status: AC
  Administered 2022-02-20: 50 mg via INTRAVENOUS
  Filled 2022-02-20: qty 1

## 2022-02-20 MED ORDER — HEPARIN SOD (PORK) LOCK FLUSH 100 UNIT/ML IV SOLN
500.0000 [IU] | Freq: Once | INTRAVENOUS | Status: AC | PRN
  Filled 2022-02-20: qty 5

## 2022-02-20 MED ORDER — PALONOSETRON HCL INJECTION 0.25 MG/5ML
0.2500 mg | Freq: Once | INTRAVENOUS | Status: AC
  Administered 2022-02-20: 0.25 mg via INTRAVENOUS
  Filled 2022-02-20: qty 5

## 2022-02-20 MED ORDER — SODIUM CHLORIDE 0.9 % IV SOLN
Freq: Once | INTRAVENOUS | Status: AC
  Filled 2022-02-20: qty 250

## 2022-02-20 MED ORDER — FAMOTIDINE IN NACL 20-0.9 MG/50ML-% IV SOLN
20.0000 mg | Freq: Once | INTRAVENOUS | Status: AC
  Administered 2022-02-20: 20 mg via INTRAVENOUS
  Filled 2022-02-20: qty 50

## 2022-02-20 MED ORDER — SODIUM CHLORIDE 0.9 % IV SOLN
176.8000 mg | Freq: Once | INTRAVENOUS | Status: AC
  Administered 2022-02-20: 180 mg via INTRAVENOUS
  Filled 2022-02-20: qty 18

## 2022-02-20 MED ORDER — SODIUM CHLORIDE 0.9 % IV SOLN
50.0000 mg/m2 | Freq: Once | INTRAVENOUS | Status: AC
  Administered 2022-02-20: 102 mg via INTRAVENOUS
  Filled 2022-02-20: qty 17

## 2022-02-20 MED ORDER — HEPARIN SOD (PORK) LOCK FLUSH 100 UNIT/ML IV SOLN
INTRAVENOUS | Status: AC
  Administered 2022-02-20: 500 [IU]
  Filled 2022-02-20: qty 5

## 2022-02-20 NOTE — Progress Notes (Signed)
[place] at [facility] Telephone:(336) [phone] Fax:(336) [phone]   Name: Phillip [name] Date: 02/20/2022 MRN: [id number]  DOB: 27-Apr-1941  Patient Care Team: [name], MD as PCP - General (Family Medicine) End, Harrell Gave, MD as PCP - Cardiology (Cardiology) Heath Lark, MD as Consulting Physician (Hematology and Oncology) Marijean Niemann, OD as Consulting Physician (Optometry) Sindy Guadeloupe, MD as Consulting Physician (Oncology) Noreene Filbert, MD as Consulting Physician (Radiation Oncology)    REASON FOR CONSULTATION: Phillip [name] is a 81 y.o. 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  Patient has had ongoing pain related to his malignancy.  Patient was referred to palliative care for ongoing symptom management.  SOCIAL HISTORY:     reports that he has never smoked. He has never used smokeless tobacco. He reports that he does not currently use alcohol. He reports that he does not use drugs.  Patient is married.  He was a long-distance runner and played college football.  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  He worked in business.  Patient has no biological children but does have stepchildren.  ADVANCE DIRECTIVES:  Not on file  CODE STATUS:   PAST MEDICAL HISTORY: Past Medical History:  Diagnosis Date   Anxiety    Basal cell carcinoma 09/12/2018   Left forehead above lateral brow. Nodular pattern   Cancer ([place])    Coronary artery disease 2003   a. nonobstructive CAD by cath in 2003 and 2005; b.  Status post three-vessel CABG on 12/12/2017 with LIMA to LAD, sequential reverse SVG to OM1 and distal LCx   COVID-19 03/2020   Diverticulosis    Hearing loss     bilateral   Heart disease    Heart murmur    History of benign prostatic hypertrophy    History of radiation therapy 01/31/10-03/22/10   right tonsil/right neck node 7000 cGy 35 sessions, high risk lymph node volume 5940 cGy 35 sessions, low risk lymph node vol 5600 cGy 35 sessions   HPV in male    positive   HTN (hypertension)    Hyperlipidemia    diet controlled- taking medication d/t ensure drinking   Hypothyroid 01/16/2013   Ischemic cardiomyopathy    a. 10/2017: echo showing reduced EF of 40-45%, HK of the anterior, anteroseptal and apical myocardium with Grade 1 DD.    Neck pain    Persistent atrial fibrillation (HCC)    Primary squamous cell carcinoma of skin of lip 2023   lip and tonuge   Squamous cell carcinoma    right tonsil- 2011   Squamous cell carcinoma of skin 09/02/2019   Crown scalp. WD SCC   Squamous cell carcinoma of skin 09/02/2019   Left distal lateral deltoid. SCCis arising in SK   Thrombocytopenia ([place]) 02/02/2012   unclear etiology   Tinnitus    Tubular adenoma of colon     PAST SURGICAL HISTORY:  Past Surgical History:  Procedure Laterality Date   artoscopic knee     CARDIAC CATHETERIZATION  2003, 2005   40% blockage, two 30% blockages treated medically Ron Parker)   CARDIOVERSION N/A 02/12/2018   Procedure: CARDIOVERSION;  Surgeon: [name], MD;  Location: [facility];  Service: Cardiovascular;  Laterality: N/A;   CARDIOVERSION N/A  05/10/2021   Procedure: CARDIOVERSION;  Surgeon: [name], MD;  Location: [facility];  Service: Cardiovascular;  Laterality: N/A;   COLONOSCOPY  03/2015   TA x3, HP, melanosis coli, severe diverticulosis, rpt 3 yrs ([place])   COLONOSCOPY  01/2019   fair prep, diverticulosis, f/u PRN ([place])   CORONARY ARTERY BYPASS GRAFT N/A 12/12/2017   Procedure: CORONARY ARTERY BYPASS GRAFTING (CABG) x3 using the right greater saphenous vein harvested endoscopically and the left internal mammary artery. LIMA to LAD, SEQ SVG to OM1  & OM2;  Surgeon: [name], MD;  Location: [facility];  Service: Open Heart Surgery;  Laterality: N/A;   DENTAL SURGERY     extractions   IR THORACENTESIS ASP PLEURAL SPACE W/IMG GUIDE  01/17/2018   KNEE ARTHROSCOPY Left    LEFT HEART CATH AND CORONARY ANGIOGRAPHY N/A 12/10/2017   Procedure: LEFT HEART CATH AND CORONARY ANGIOGRAPHY;  Surgeon: [name], MD;  Location: [facility];  Service: Cardiovascular;  Laterality: N/A;   PEG PLACEMENT  02/2010   PORTA CATH INSERTION N/A 01/26/2022   Procedure: PORTA CATH INSERTION;  Surgeon: Katha Cabal, MD;  Location: [facility];  Service: Cardiovascular;  Laterality: N/A;   TEE WITHOUT CARDIOVERSION N/A 12/12/2017   Procedure: TRANSESOPHAGEAL ECHOCARDIOGRAM (TEE);  Surgeon: [name], MD;  Location: [facility];  Service: Open Heart Surgery;  Laterality: N/A;   TONSILLECTOMY     triple bypass  11/2017   VASECTOMY      HEMATOLOGY/ONCOLOGY HISTORY:  Oncology History  Squamous cell carcinoma of buccal mucosa ([place])  11/09/2021 Initial Diagnosis   SCC (squamous cell carcinoma of buccal mucosa) ([place])   11/09/2021 Cancer Staging   Staging form: Oral Cavity, AJCC 8th Edition - Clinical stage from 11/09/2021: Stage I (cT1, cN0, cM0) - Signed by Sindy Guadeloupe, MD on 11/09/2021 Histologic grade (G): G1 Histologic grading system: 3 grade system   Squamous cell carcinoma of oral cavity ([place])  01/18/2022 Initial Diagnosis   Squamous cell carcinoma of oral cavity ([place])   01/18/2022 Cancer Staging   Staging form: Oral Cavity, AJCC 8th Edition - Clinical stage from 01/18/2022: Stage III (cT3, cN1, cM0) - Signed by Sindy Guadeloupe, MD on 01/18/2022   01/30/2022 -  Chemotherapy   Patient is on Treatment Plan : head and neck Carboplatin/PACLitaxel weekly x 6 weeks with XRT         ALLERGIES:  is allergic to ace inhibitors and morphine and related.  MEDICATIONS:  Current Outpatient Medications  Medication Sig Dispense Refill    amLODipine (NORVASC) 5 MG tablet Take 1 tablet (5 mg total) by mouth daily. 30 tablet 0   apixaban (ELIQUIS) 5 MG TABS tablet Take 1 tablet (5 mg total) by mouth 2 (two) times daily. Please restart your Eliquis tomorrow morning 60 tablet 5   Benzocaine (HURRICAINE) 20 % AERO Use as directed 1 Application in the mouth or throat as needed. 57 g 3   Cholecalciferol (VITAMIN D3) 25 MCG (1000 UT) CAPS Take 1 capsule by mouth daily.     dexamethasone (DECADRON) 4 MG tablet Take 2 tablets (8 mg total) by mouth daily. Start the day after chemotherapy for 2 days. 30 tablet 1   docusate sodium (COLACE) 250 MG capsule Takes total '600mg'$  /day     fluocinonide gel (LIDEX) 0.05 %      FLUoxetine (PROZAC) 20 MG capsule Take 1 capsule (20 mg total) by mouth daily. 30 capsule 6   furosemide (  LASIX) 20 MG tablet Take 1 tablet by mouth once daily 30 tablet 2   levothyroxine (EUTHYROX) 50 MCG tablet Take 1 tablet (50 mcg total) by mouth daily before breakfast. 30 tablet 8   lidocaine-prilocaine (EMLA) cream Apply to affected area once 30 g 3   MAGNESIUM PO Take 1 tablet by mouth daily.     Multiple Vitamins-Minerals (ZINC PO) Take by mouth daily.     nitroGLYCERIN (NITROSTAT) 0.4 MG SL tablet Place 1 tablet (0.4 mg total) under the tongue every 5 (five) minutes as needed for chest pain (Maximum 3 doses.). (Patient not taking: Reported on 02/13/2022) 25 tablet 2   Omega-3 Fatty Acids (OMEGA 3 PO) Take by mouth daily.     ondansetron (ZOFRAN) 8 MG tablet Take 1 tablet (8 mg total) by mouth 2 (two) times daily as needed for refractory nausea / vomiting. Start on day 3 after chemo. 30 tablet 1   ondansetron (ZOFRAN-ODT) 4 MG disintegrating tablet Take 1 tablet (4 mg total) by mouth every 8 (eight) hours as needed for nausea or vomiting. 20 tablet 0   polyethylene glycol (MIRALAX / GLYCOLAX) 17 g packet Take 17 g by mouth daily as needed.     Potassium 99 MG TABS Take 1 tablet by mouth daily.     pregabalin (LYRICA) 75 MG  capsule Take 1 capsule (75 mg total) by mouth 2 (two) times daily. 56 capsule 3   prochlorperazine (COMPAZINE) 10 MG tablet Take 1 tablet (10 mg total) by mouth every 6 (six) hours as needed (Nausea or vomiting). (Patient not taking: Reported on 02/13/2022) 30 tablet 1   rosuvastatin (CRESTOR) 20 MG tablet Take 1 tablet by mouth once daily 90 tablet 3   traMADol (ULTRAM) 50 MG tablet Take 1 tablet (50 mg total) by mouth every 6 (six) hours as needed. 30 tablet 0   VITAMIN A PO Take 2,400 mcg by mouth daily.      vitamin E 400 UNIT capsule Take 400 Units by mouth daily.     zaleplon (SONATA) 10 MG capsule Take 1 capsule (10 mg total) by mouth at bedtime as needed for sleep. 30 capsule 0   Zinc 50 MG CAPS Take 50 mg by mouth daily.     No current facility-administered medications for this visit.    VITAL SIGNS: There were no vitals taken for this visit. There were no vitals filed for this visit.  Estimated body mass index is 21.94 kg/m as calculated from the following:   Height as of 02/02/22: '6\' 2"'$  (1.88 m).   Weight as of an earlier encounter on 02/20/22: 170 lb 13.7 oz (77.5 kg).  LABS: CBC:    Component Value Date/Time   WBC 5.4 02/20/2022 0828   HGB 14.5 02/20/2022 0828   HGB 16.9 04/13/2021 1609   HGB 16.7 12/29/2013 1351   HCT 42.1 02/20/2022 0828   HCT 49.9 04/13/2021 1609   HCT 49.2 12/29/2013 1351   PLT 192 02/20/2022 0828   PLT 131 (L) 04/13/2021 1609   MCV 96.8 02/20/2022 0828   MCV 98 (H) 04/13/2021 1609   MCV 97.1 12/29/2013 1351   NEUTROABS 3.3 02/20/2022 0828   NEUTROABS 3.7 03/20/2018 0947   NEUTROABS 6.0 12/29/2013 1351   LYMPHSABS 1.7 02/20/2022 0828   LYMPHSABS 0.5 (L) 03/20/2018 0947   LYMPHSABS 0.4 (L) 12/29/2013 1351   MONOABS 0.4 02/20/2022 0828   MONOABS 0.5 12/29/2013 1351   EOSABS 0.0 02/20/2022 0828   EOSABS 0.1 03/20/2018 0947  BASOSABS 0.0 02/20/2022 0828   BASOSABS 0.0 03/20/2018 0947   BASOSABS 0.0 12/29/2013 1351   Comprehensive Metabolic  Panel:    Component Value Date/Time   NA 135 02/20/2022 0828   NA 142 05/13/2021 0955   NA 141 12/29/2013 1351   K 3.8 02/20/2022 0828   K 4.2 12/29/2013 1351   CL 100 02/20/2022 0828   CL 106 01/16/2013 0938   CO2 26 02/20/2022 0828   CO2 23 12/29/2013 1351   BUN 18 02/20/2022 0828   BUN 15 05/13/2021 0955   BUN 17.2 12/29/2013 1351   CREATININE 0.87 02/20/2022 0828   CREATININE 0.9 12/29/2013 1351   GLUCOSE 179 (H) 02/20/2022 0828   GLUCOSE 125 12/29/2013 1351   GLUCOSE 108 (H) 01/16/2013 0938   CALCIUM 8.7 (L) 02/20/2022 0828   CALCIUM 9.9 12/29/2013 1351   AST 33 02/20/2022 0828   AST 18 12/29/2013 1351   ALT 44 02/20/2022 0828   ALT 21 12/29/2013 1351   ALKPHOS 51 02/20/2022 0828   ALKPHOS 74 12/29/2013 1351   BILITOT 1.1 02/20/2022 0828   BILITOT 0.4 04/13/2021 1609   BILITOT 0.30 12/29/2013 1351   PROT 6.4 (L) 02/20/2022 0828   PROT 6.7 04/13/2021 1609   PROT 7.1 12/29/2013 1351   ALBUMIN 3.6 02/20/2022 0828   ALBUMIN 4.6 04/13/2021 1609   ALBUMIN 4.0 12/29/2013 1351    RADIOGRAPHIC STUDIES: PERIPHERAL VASCULAR CATHETERIZATION  Result Date: 01/26/2022 See surgical note for result.   PERFORMANCE STATUS (ECOG) : 1 - Symptomatic but completely ambulatory  Review of Systems Unless otherwise noted, a complete review of systems is negative.  Physical Exam General: NAD HEENT: Swollen right lip Pulmonary: Unlabored Extremities: no edema, no joint deformities Skin: no rashes Neurological: Weakness but otherwise nonfocal  IMPRESSION: Follow-up visit with patient and wife.  Patient denies significant changes.  He continues to endorse persistent oral pain and has mucus drainage requiring frequent wiping of the mouth.  He continues to utilize HurriCaine spray but does not find it to be significantly beneficial.  He was started on Lyrica by Dr. Janese Banks but he has not found that to be helpful either.  Patient is reluctant to take other pain medications including  tramadol.  I called in a prescription to [facility] for viscous lidocaine.  Given decline in performance status, wife is interested in home resources.  She asked about hospice but we discussed that that would entail discontinuing further cancer treatment, which they are not ready to do.  We will send referral to home palliative care.  Additionally, I gave wife a copy of the sitter list provided by social work.  Patient has a long-term care insurance policy and we discussed utilizing that to provide additional assistance.  PLAN: -Continue current scope of treatment -Viscous lidocaine oral rinse Rx sent to [facility] for compounding -Tramadol as needed -Daily bowel regimen with MiraLAX/senna -RTC 3 to 4 weeks   Patient expressed understanding and was in agreement with this plan. He also understands that He can call the clinic at any time with any questions, concerns, or complaints.     Time Total: 20 minutes  Visit consisted of counseling and education dealing with the complex and emotionally intense issues of symptom management and palliative care in the setting of serious and potentially life-threatening illness.Greater than 50%  of this time was spent counseling and coordinating care related to the above assessment and plan.  Signed by: Altha Harm, PhD, NP-C

## 2022-02-20 NOTE — Patient Instructions (Signed)
[facility] [place]  Discharge Instructions: Thank you for choosing [facility] to provide your oncology and hematology care.  If you have a lab appointment with the Cancer Center, please go directly to the Cancer Center and check in at the registration area.  Wear comfortable clothing and clothing appropriate for easy access to any Portacath or PICC line.   We strive to give you quality time with your provider. You may need to reschedule your appointment if you arrive late (15 or more minutes).  Arriving late affects you and other patients whose appointments are after yours.  Also, if you miss three or more appointments without notifying the office, you may be dismissed from the clinic at the provider's discretion.      For prescription refill requests, have your pharmacy contact our office and allow 72 hours for refills to be completed.    Today you received the following chemotherapy and/or immunotherapy agents: Taxol, Carboplatin      To help prevent nausea and vomiting after your treatment, we encourage you to take your nausea medication as directed.  BELOW ARE SYMPTOMS THAT SHOULD BE REPORTED IMMEDIATELY: *FEVER GREATER THAN 100.4 F (38 C) OR HIGHER *CHILLS OR SWEATING *NAUSEA AND VOMITING THAT IS NOT CONTROLLED WITH YOUR NAUSEA MEDICATION *UNUSUAL SHORTNESS OF BREATH *UNUSUAL BRUISING OR BLEEDING *URINARY PROBLEMS (pain or burning when urinating, or frequent urination) *BOWEL PROBLEMS (unusual diarrhea, constipation, pain near the anus) TENDERNESS IN MOUTH AND THROAT WITH OR WITHOUT PRESENCE OF ULCERS (sore throat, sores in mouth, or a toothache) UNUSUAL RASH, SWELLING OR PAIN  UNUSUAL VAGINAL DISCHARGE OR ITCHING   Items with * indicate a potential emergency and should be followed up as soon as possible or go to the Emergency Department if any problems should occur.  Please show the CHEMOTHERAPY ALERT CARD or IMMUNOTHERAPY ALERT CARD at  check-in to the Emergency Department and triage nurse.  Should you have questions after your visit or need to cancel or reschedule your appointment, please contact [facility]  [phone] and follow the prompts.  Office hours are 8:00 a.m. to 4:30 p.m. Monday - Friday. Please note that voicemails left after 4:00 p.m. may not be returned until the following business day.  We are closed weekends and major holidays. You have access to a nurse at all times for urgent questions. Please call the main number to the clinic [phone] and follow the prompts.  For any non-urgent questions, you may also contact your provider using MyChart. We now offer e-Visits for anyone 18 and older to request care online for non-urgent symptoms. For details visit [email or web address].   Also download the MyChart app! Go to the app store, search "MyChart", open the app, select [facility], and log in with your MyChart username and password.  Masks are optional in the cancer centers. If you would like for your care team to wear a mask while they are taking care of you, please let them know. For doctor visits, patients may have with them one support person who is at least 82 years old. At this time, visitors are not allowed in the infusion area.   

## 2022-02-21 ENCOUNTER — Telehealth: Payer: Self-pay | Admitting: Nurse Practitioner

## 2022-02-21 ENCOUNTER — Other Ambulatory Visit: Payer: Self-pay | Admitting: Internal Medicine

## 2022-02-21 NOTE — Telephone Encounter (Signed)
Rec'd return call from wife and spoke with her about Palliative referral/services and she wanted to wait until she spoke with Dr. Janese Banks and see what needs to be done after he finishes his Chemo on Monday.    She took my name and number and will call me back after speaking with Dr. Janese Banks

## 2022-02-21 NOTE — Telephone Encounter (Signed)
Attempted to contact patient at home # to offer to schedule a Palliative Consult, no answer - left message with reason for call along with my name and call back number.  I also sent patient a MyChart message as well.

## 2022-02-22 ENCOUNTER — Ambulatory Visit: Payer: PPO

## 2022-02-22 ENCOUNTER — Ambulatory Visit
Admission: RE | Admit: 2022-02-22 | Discharge: 2022-02-22 | Disposition: A | Discharge status: Home / Self Care | Payer: PPO | Source: Ambulatory Visit | Attending: Radiation Oncology | Admitting: Radiation Oncology

## 2022-02-22 ENCOUNTER — Other Ambulatory Visit: Payer: Self-pay

## 2022-02-22 DIAGNOSIS — Z5111 Encounter for antineoplastic chemotherapy: Secondary | ICD-10-CM | POA: Diagnosis not present

## 2022-02-22 DIAGNOSIS — C4402 Squamous cell carcinoma of skin of lip: Secondary | ICD-10-CM | POA: Diagnosis not present

## 2022-02-22 LAB — RAD ONC ARIA SESSION SUMMARY
Course Elapsed Days: 23
Plan Fractions Treated to Date: 7
Plan Prescribed Dose Per Fraction: 2 Gy
Plan Total Fractions Prescribed: 15
Plan Total Prescribed Dose: 30 Gy
Reference Point Dosage Given to Date: 34 Gy
Reference Point Session Dosage Given: 2 Gy
Session Number: 17

## 2022-02-22 NOTE — Progress Notes (Unsigned)
Cardiology Clinic Note   Patient Name: Phillip [name] Date of Encounter: 02/23/2022  Primary Care Provider:  [name], MD Primary Cardiologist:  [name], MD  Patient Profile    81 year old male with a history of coronary artery disease s/p CABG in 08/5174, chronic systolic congestive heart failure secondary to ischemic cardiomyopathy with EF 45-50% in 04/2021, persistent atrial fibrillation, essential hypertension, hyperlipidemia, and tonsillar cancer who presents today for follow up on his persistent atrial fibrillation.   Past Medical History    Past Medical History:  Diagnosis Date   Anxiety    Basal cell carcinoma 09/12/2018   Left forehead above lateral brow. Nodular pattern   Cancer ([place])    Coronary artery disease 2003   a. nonobstructive CAD by cath in 2003 and 2005; b.  Status post three-vessel CABG on 12/12/2017 with LIMA to LAD, sequential reverse SVG to OM1 and distal LCx   COVID-19 03/2020   Diverticulosis    Hearing loss    bilateral   Heart disease    Heart murmur    History of benign prostatic hypertrophy    History of radiation therapy 01/31/10-03/22/10   right tonsil/right neck node 7000 cGy 35 sessions, high risk lymph node volume 5940 cGy 35 sessions, low risk lymph node vol 5600 cGy 35 sessions   HPV in male    positive   HTN (hypertension)    Hyperlipidemia    diet controlled- taking medication d/t ensure drinking   Hypothyroid 01/16/2013   Ischemic cardiomyopathy    a. 10/2017: echo showing reduced EF of 40-45%, HK of the anterior, anteroseptal and apical myocardium with Grade 1 DD.    Neck pain    Persistent atrial fibrillation (HCC)    Primary squamous cell carcinoma of skin of lip 2023   lip and tonuge   Squamous cell carcinoma    right tonsil- 2011   Squamous cell carcinoma of skin 09/02/2019   Crown scalp. WD SCC   Squamous cell carcinoma of skin 09/02/2019   Left distal lateral deltoid. SCCis arising in SK   Thrombocytopenia  ([place]) 02/02/2012   unclear etiology   Tinnitus    Tubular adenoma of colon    Past Surgical History:  Procedure Laterality Date   artoscopic knee     CARDIAC CATHETERIZATION  2003, 2005   40% blockage, two 30% blockages treated medically Ron Parker)   CARDIOVERSION N/A 02/12/2018   Procedure: CARDIOVERSION;  Surgeon: [name], MD;  Location: [facility];  Service: Cardiovascular;  Laterality: N/A;   CARDIOVERSION N/A 05/10/2021   Procedure: CARDIOVERSION;  Surgeon: [name], MD;  Location: [facility];  Service: Cardiovascular;  Laterality: N/A;   COLONOSCOPY  03/2015   TA x3, HP, melanosis coli, severe diverticulosis, rpt 3 yrs ([place])   COLONOSCOPY  01/2019   fair prep, diverticulosis, f/u PRN ([place])   CORONARY ARTERY BYPASS GRAFT N/A 12/12/2017   Procedure: CORONARY ARTERY BYPASS GRAFTING (CABG) x3 using the right greater saphenous vein harvested endoscopically and the left internal mammary artery. LIMA to LAD, SEQ SVG to OM1 & OM2;  Surgeon: [name], MD;  Location: [place];  Service: Open Heart Surgery;  Laterality: N/A;   DENTAL SURGERY     extractions   IR THORACENTESIS ASP PLEURAL SPACE W/IMG GUIDE  01/17/2018   KNEE ARTHROSCOPY Left    LEFT HEART CATH AND CORONARY ANGIOGRAPHY N/A 12/10/2017   Procedure: LEFT HEART CATH AND CORONARY ANGIOGRAPHY;  Surgeon: [name], MD;  Location: [place] [facility];  Service: Cardiovascular;  Laterality: N/A;   PEG PLACEMENT  02/2010   PORTA CATH INSERTION N/A 01/26/2022   Procedure: PORTA CATH INSERTION;  Surgeon: Katha Cabal, MD;  Location: [place] [facility];  Service: Cardiovascular;  Laterality: N/A;   TEE WITHOUT CARDIOVERSION N/A 12/12/2017   Procedure: TRANSESOPHAGEAL ECHOCARDIOGRAM (TEE);  Surgeon: [name], MD;  Location: [facility] [place];  Service: Open Heart Surgery;  Laterality: N/A;   TONSILLECTOMY     triple bypass  11/2017   VASECTOMY      Allergies  Allergies  Allergen Reactions   Ace Inhibitors  Swelling and Other (See Comments)    Possible angioedema (upper lip swelling)   Morphine And Related Swelling    SWELLING REACTION UNSPECIFIED  [severity rated per PMH, 12/11/2017]    History of Present Illness    81 year old male with a history of CAD status post three-vessel CABG on 12/08/2017 with LIMA to LAD as well as sequential reverse saphenous to OM 1 and distal left circumflex, chronic systolic congestive heart failure secondary to ischemic cardiomyopathy, postoperative atrial fibrillation following CABG on amiodarone, tonsillar squamous cell cancer status post radiation, thrombocytopenia of unclear etiology, essential hypertension, hyperlipidemia, and hypothyroidism.He has a continued to have complaints of palpitations and has undergone 2 separate cardioversion procedures.  The last being in 05/10/2021 which at that time was successful.  He has been followed in the office several times since then with his last visit being on 02/02/2022 where he was seen by Dr. Saunders Revel.  At that time he had been doing fairly well but had noted some labile blood pressures at home.  He had voiced concern about chronic irritation along the right side of his cheek that have been persistent ever since he underwent radiation for tonsillar cancer several years ago.  He then followed up with [facility] where he was ultimately diagnosed by Dr. Tami Ribas with squamous cell carcinoma.  He underwent radiation therapy and is receiving chemotherapy through Dr. Janese Banks.  He had an implanted port placed and had to hold his apixaban for 2 days in anticipation and restarted his apixaban after his procedure was completed.  He had stated he thought he had been in atrial fibrillation for about 2 to 3 weeks at that time.  He is continuing to undergo more XRT and chemotherapy.  He is in clinic today stating that he has been doing fairly well.  He has had repeat episodes of dizziness upon walking into the office today as they parked in the parking  lot and he was walking in the heat.  Since the incident of lightheadedness on arrival he will be waiting to be picked up at the front door.  He continues to have palpitations and feeling fast heart rates.  Denies any chest pain or associated shortness of breath.  He has used 1 more chemotherapy treatment that will be completed on Monday of this coming week and his last radiation treatment is scheduled for 7/17.  He is in today accompanied by family member to discuss DCCV.  Of note, previously his amlodipine dose was decreased to allow for an increase in his blood pressure which has improved and his previous visit systolic blood pressure was in the 80s and today he is 100 and notes that he feels much better with a little bit more blood pressure. Home Medications    Current Outpatient Medications  Medication Sig Dispense Refill   amLODipine (NORVASC) 5 MG tablet Take 1  tablet (5 mg total) by mouth daily. 30 tablet 0   apixaban (ELIQUIS) 5 MG TABS tablet Take 1 tablet (5 mg total) by mouth 2 (two) times daily. Please restart your Eliquis tomorrow morning 60 tablet 5   Benzocaine (HURRICAINE) 20 % AERO Use as directed 1 Application in the mouth or throat as needed. 57 g 3   Cholecalciferol (VITAMIN D3) 25 MCG (1000 UT) CAPS Take 1 capsule by mouth daily.     dexamethasone (DECADRON) 4 MG tablet Take 2 tablets (8 mg total) by mouth daily. Start the day after chemotherapy for 2 days. 30 tablet 1   docusate sodium (COLACE) 250 MG capsule Takes total '600mg'$  /day     fluocinonide gel (LIDEX) 0.05 %      FLUoxetine (PROZAC) 20 MG capsule Take 1 capsule (20 mg total) by mouth daily. 30 capsule 6   furosemide (LASIX) 20 MG tablet Take 1 tablet by mouth once daily 90 tablet 0   levothyroxine (EUTHYROX) 50 MCG tablet Take 1 tablet (50 mcg total) by mouth daily before breakfast. 30 tablet 8   lidocaine-prilocaine (EMLA) cream Apply to affected area once 30 g 3   MAGNESIUM PO Take 1 tablet by mouth daily.      Multiple Vitamins-Minerals (ZINC PO) Take by mouth daily.     nitroGLYCERIN (NITROSTAT) 0.4 MG SL tablet Place 1 tablet (0.4 mg total) under the tongue every 5 (five) minutes as needed for chest pain (Maximum 3 doses.). 25 tablet 2   Omega-3 Fatty Acids (OMEGA 3 PO) Take by mouth daily.     ondansetron (ZOFRAN) 8 MG tablet Take 1 tablet (8 mg total) by mouth 2 (two) times daily as needed for refractory nausea / vomiting. Start on day 3 after chemo. 30 tablet 1   ondansetron (ZOFRAN-ODT) 4 MG disintegrating tablet Take 1 tablet (4 mg total) by mouth every 8 (eight) hours as needed for nausea or vomiting. 20 tablet 0   polyethylene glycol (MIRALAX / GLYCOLAX) 17 g packet Take 17 g by mouth daily as needed.     Potassium 99 MG TABS Take 1 tablet by mouth daily.     pregabalin (LYRICA) 75 MG capsule Take 1 capsule (75 mg total) by mouth 2 (two) times daily. 56 capsule 3   prochlorperazine (COMPAZINE) 10 MG tablet Take 1 tablet (10 mg total) by mouth every 6 (six) hours as needed (Nausea or vomiting). 30 tablet 1   rosuvastatin (CRESTOR) 20 MG tablet Take 1 tablet by mouth once daily 90 tablet 3   traMADol (ULTRAM) 50 MG tablet Take 1 tablet (50 mg total) by mouth every 6 (six) hours as needed. 30 tablet 0   VITAMIN A PO Take 2,400 mcg by mouth daily.      vitamin E 400 UNIT capsule Take 400 Units by mouth daily.     zaleplon (SONATA) 10 MG capsule Take 1 capsule (10 mg total) by mouth at bedtime as needed for sleep. 30 capsule 0   Zinc 50 MG CAPS Take 50 mg by mouth daily.     No current facility-administered medications for this visit.     Family History    Family History  Problem Relation Age of Onset   Coronary artery disease Father    Heart attack Father 38   Heart disease Father    Stroke Mother    Colon cancer Neg Hx    Esophageal cancer Neg Hx    Rectal cancer Neg Hx    Stomach  cancer Neg Hx    Kidney cancer Neg Hx    Kidney failure Neg Hx    Prostate cancer Neg Hx     Tuberculosis Neg Hx    He indicated that his mother is deceased. He indicated that his father is deceased. He indicated that the status of his neg hx is unknown.  Social History    Social History   Socioeconomic History   Marital status: Married    Spouse name: Electrical engineer   Number of children: 1   Years of education: Not on file   Highest education level: Not on file  Occupational History   Not on file  Tobacco Use   Smoking status: Never   Smokeless tobacco: Never  Vaping Use   Vaping Use: Never used  Substance and Sexual Activity   Alcohol use: Not Currently    Comment: glass of wine 1-2 times a month   Drug use: No   Sexual activity: Yes  Other Topics Concern   Not on file  Social History Narrative   Married 8 years- divorced; remarried 1994 Optometrist)   Forensic psychologist    Work; Film/video editor; 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    Plays golf, remains very active with an interest in politics. Jan 2011 moved to area.    Involved in [employer].    He is a runner - has run [employer] and [employer]. S/p torn meniscus.    Social Determinants of Health   Financial Resource Strain: Low Risk  (04/28/2020)   Overall Financial Resource Strain (CARDIA)    Difficulty of Paying Living Expenses: Not hard at all  Food Insecurity: No Food Insecurity (04/28/2020)   Hunger Vital Sign    Worried About Running Out of Food in the Last Year: Never true    Ran Out of Food in the Last Year: Never true  Transportation Needs: No Transportation Needs (04/28/2020)   PRAPARE - Hydrologist (Medical): No    Lack of Transportation (Non-Medical): No  Physical Activity: Sufficiently Active (04/28/2020)   Exercise Vital Sign    Days of Exercise per Week: 7 days    Minutes of Exercise per Session: 30 min  Stress: No Stress Concern Present (04/28/2020)   [place]    Feeling of Stress : Not at all  Social Connections: Unknown (12/11/2017)   Social Connection and Isolation Panel [NHANES]    Frequency of Communication with Friends and Family: Patient refused    Frequency of Social Gatherings with Friends and Family: Patient refused    Attends Religious Services: Patient refused    Active Member of Clubs or Organizations: Patient refused    Attends Archivist Meetings: Patient refused    Marital Status: Patient refused  Intimate Partner Violence: Not At Risk (04/28/2020)   Humiliation, Afraid, Rape, and Kick questionnaire    Fear of Current or Ex-Partner: No    Emotionally Abused: No    Physically Abused: No    Sexually Abused: No     Review of Systems    General:  No chills, fever, night sweats or weight changes.  Cardiovascular:  No chest pain, dyspnea on exertion, edema, orthopnea, paroxysmal nocturnal dyspnea, endorses palpitations/fast heart beats Dermatological: No rash, lesions/masses Respiratory: No cough, dyspnea Urologic: No hematuria, dysuria Abdominal:   No nausea, vomiting, diarrhea, bright red blood per rectum, melena, or hematemesis Neurologic:  No visual changes, wkns, changes in mental status. All other systems reviewed and are otherwise negative except as noted above.     Physical Exam    VS:  BP 100/60 (BP Location: Left Arm, Patient Position: Sitting, Cuff Size: Normal)   Pulse 93   Ht '6\' 2"'$  (1.88 m)   Wt 169 lb 4 oz (76.8 kg)   SpO2 95%   BMI 21.73 kg/m  , BMI Body mass index is 21.73 kg/m.     GEN: Well nourished, well developed, in no acute distress. Neck: Supple, no JVD, carotid bruits, or masses. Cardiac: Irregularly irregular rate and rhythm, no murmurs, rubs, or gallops. No clubbing, cyanosis, edema.  Radials/DP/PT 2+ and equal bilaterally.  Respiratory:  Respirations regular and unlabored, clear to auscultation bilaterally.  Unlabored at rest on room air GI: Soft, nontender,  nondistended, BS + x 4. MS: no deformity or atrophy. Skin: warm and dry, no rash. Neuro:  Strength and sensation are intact. Psych: Normal affect.  Accessory Clinical Findings    ECG personally reviewed by me today-rate controlled atrial fibrillation rate of 80-90 with chronic left bundle branch block, left anterior fascicular block, and LVH- No acute changes  Lab Results  Component Value Date   WBC 5.4 02/20/2022   HGB 14.5 02/20/2022   HCT 42.1 02/20/2022   MCV 96.8 02/20/2022   PLT 192 02/20/2022   Lab Results  Component Value Date   CREATININE 0.87 02/20/2022   BUN 18 02/20/2022   NA 135 02/20/2022   K 3.8 02/20/2022   CL 100 02/20/2022   CO2 26 02/20/2022   Lab Results  Component Value Date   ALT 44 02/20/2022   AST 33 02/20/2022   ALKPHOS 51 02/20/2022   BILITOT 1.1 02/20/2022   Lab Results  Component Value Date   CHOL 102 04/26/2021   HDL 38.60 (L) 04/26/2021   LDLCALC 50 04/26/2021   LDLDIRECT 101.0 10/13/2014   TRIG 67.0 04/26/2021   CHOLHDL 3 04/26/2021    Lab Results  Component Value Date   HGBA1C 6.1 04/26/2021    Assessment & Plan   1.  Persistent atrial fibrillation that is rate controlled today on EKG.  He states that he has been compliant with his Eliquis and has not missed any doses since the beginning part of June when he had to hold for port placement.  Since he continues to be rate controlled today for his atrial fibrillation and no AV nodal blocking agents were added.  He does have a longstanding history of sinus bradycardia at his baseline.  His blood pressure has improved with the decrease of his amlodipine but he continues to have some lightheadedness.  As he continues to be in atrial fibrillation today he has been scheduled for DCCV as previously as discussed at his previous appointment.  He has no questions related to the procedure today.  2.  Coronary artery disease without any chest discomfort.  We will continue with secondary prevention  at this time.  3.  Essential hypertension with previous medication changes made patient states he has noted improvement.  He does have some continued intermittent lightheadedness but has a decreased since having medication changes.  Concerns today related to atrial fibrillation.  4.  Oral cancer is 1 chemotherapy treatment left it is scheduled for Monday and his last radiation treatment is scheduled for 7/17.  Ongoing management per heme-onc and radiation oncology  5.  Hyperlipidemia with LDL of 50 on 04/26/2021.  Continue current rosuvastatin  20 mg daily.  Followed by PCP.  Disposition patient has been scheduled for DCCV after continuous uninterrupted anticoagulant therapy of greater than 4 weeks.  His return appointment to the office will be 2 to 3 weeks postprocedure with an EKG on return to see MD/APP  Shared Decision Making/Informed Consent The risks (stroke, cardiac arrhythmias rarely resulting in the need for a temporary or permanent pacemaker, skin irritation or burns and complications associated with conscious sedation including aspiration, arrhythmia, respiratory failure and death), benefits (restoration of normal sinus rhythm) and alternatives of a direct current cardioversion were explained in detail to Phillip [name] and he agrees to proceed.    Terrick Allred, NP 02/23/2022, 10:37 AM

## 2022-02-23 ENCOUNTER — Other Ambulatory Visit: Payer: Self-pay

## 2022-02-23 ENCOUNTER — Ambulatory Visit (INDEPENDENT_AMBULATORY_CARE_PROVIDER_SITE_OTHER): Payer: PPO | Admitting: Cardiology

## 2022-02-23 ENCOUNTER — Ambulatory Visit: Payer: PPO

## 2022-02-23 ENCOUNTER — Ambulatory Visit
Admission: RE | Admit: 2022-02-23 | Discharge: 2022-02-23 | Disposition: A | Discharge status: Home / Self Care | Payer: PPO | Source: Ambulatory Visit | Attending: Radiation Oncology | Admitting: Radiation Oncology

## 2022-02-23 ENCOUNTER — Encounter: Payer: Self-pay | Admitting: Medical

## 2022-02-23 VITALS — BP 100/60 | HR 93 | Ht 74.0 in | Wt 169.2 lb

## 2022-02-23 DIAGNOSIS — C4402 Squamous cell carcinoma of skin of lip: Secondary | ICD-10-CM | POA: Diagnosis not present

## 2022-02-23 DIAGNOSIS — C060 Malignant neoplasm of cheek mucosa: Secondary | ICD-10-CM | POA: Diagnosis not present

## 2022-02-23 DIAGNOSIS — E785 Hyperlipidemia, unspecified: Secondary | ICD-10-CM | POA: Diagnosis not present

## 2022-02-23 DIAGNOSIS — I2510 Atherosclerotic heart disease of native coronary artery without angina pectoris: Secondary | ICD-10-CM

## 2022-02-23 DIAGNOSIS — I10 Essential (primary) hypertension: Secondary | ICD-10-CM | POA: Diagnosis not present

## 2022-02-23 DIAGNOSIS — I4819 Other persistent atrial fibrillation: Secondary | ICD-10-CM

## 2022-02-23 DIAGNOSIS — Z5111 Encounter for antineoplastic chemotherapy: Secondary | ICD-10-CM | POA: Diagnosis not present

## 2022-02-23 LAB — RAD ONC ARIA SESSION SUMMARY
Course Elapsed Days: 24
Plan Fractions Treated to Date: 8
Plan Prescribed Dose Per Fraction: 2 Gy
Plan Total Fractions Prescribed: 15
Plan Total Prescribed Dose: 30 Gy
Reference Point Dosage Given to Date: 36 Gy
Reference Point Session Dosage Given: 2 Gy
Session Number: 18

## 2022-02-23 NOTE — Patient Instructions (Addendum)
Medication Instructions:  Your physician recommends that you continue on your current medications as directed. Please refer to the Current Medication list given to you today.  *If you need a refill on your cardiac medications before your next appointment, please call your pharmacy*   Lab Work: TODAY: BMET, CBC If you have labs (blood work) drawn today and your tests are completely normal, you will receive your results only by: [place] (if you have MyChart) OR A paper copy in the mail If you have any lab test that is abnormal or we need to change your treatment, we will call you to review the results.  Testing/Procedures: Your physician has recommended that you have a Cardioversion (DCCV). Electrical Cardioversion uses a jolt of electricity to your heart either through paddles or wired patches attached to your chest. This is a controlled, usually prescheduled, procedure. Defibrillation is done under light anesthesia in the hospital, and you usually go home the day of the procedure. This is done to get your heart back into a normal rhythm. You are not awake for the procedure. Please see the instruction sheet given to you today.  Follow-Up: At [facility], you and your health needs are our priority.  As part of our continuing mission to provide you with exceptional heart care, we have created designated Provider Care Teams.  These Care Teams include your primary Cardiologist (physician) and Advanced Practice Providers (APPs -  Physician Assistants and Nurse Practitioners) who all work together to provide you with the care you need, when you need it.  We recommend signing up for the patient portal called "MyChart".  Sign up information is provided on this After Visit Summary.  MyChart is used to connect with patients for Virtual Visits (Telemedicine).  Patients are able to view lab/test results, encounter notes, upcoming appointments, etc.  Non-urgent messages can be sent to your provider as  well.   To learn more about what you can do with MyChart, go to [email or web address].    Your next appointment:   2 week(s) AFTER CARDIOVERSION  The format for your next appointment:   In Person  Provider:   You may see [name], MD or one of the following Advanced Practice Providers on your designated Care Team:   Murray Hodgkins, NP Christell Faith, PA-C Cadence Kathlen Mody, PA-C    You are scheduled for a Cardioversion on ___JULY 18TH_____________ with Dr._END__________ Please arrive at the [facility] at ______6:30___ a.m. on the day of your procedure.  DIET INSTRUCTIONS:  Nothing to eat or drink after midnight except your medications with a              sip of water.         Labs: ____BMET AND CBC______________  Medications:  YOU MAY TAKE ALL of your remaining medications with a small amount of water.  Must have a responsible person to drive you home.  Bring a current list of your medications and current insurance cards.    If you have any questions after you get home, please call the office at [phone]

## 2022-02-24 ENCOUNTER — Other Ambulatory Visit: Payer: Self-pay

## 2022-02-24 ENCOUNTER — Ambulatory Visit
Admission: RE | Admit: 2022-02-24 | Discharge: 2022-02-24 | Disposition: A | Discharge status: Home / Self Care | Payer: PPO | Source: Ambulatory Visit | Attending: Radiation Oncology | Admitting: Radiation Oncology

## 2022-02-24 ENCOUNTER — Ambulatory Visit: Payer: PPO

## 2022-02-24 ENCOUNTER — Encounter: Payer: Self-pay | Admitting: Licensed Clinical Social Worker

## 2022-02-24 ENCOUNTER — Telehealth: Payer: Self-pay | Admitting: Hospice and Palliative Medicine

## 2022-02-24 DIAGNOSIS — Z5111 Encounter for antineoplastic chemotherapy: Secondary | ICD-10-CM | POA: Diagnosis not present

## 2022-02-24 DIAGNOSIS — C4402 Squamous cell carcinoma of skin of lip: Secondary | ICD-10-CM | POA: Diagnosis not present

## 2022-02-24 DIAGNOSIS — C069 Malignant neoplasm of mouth, unspecified: Secondary | ICD-10-CM

## 2022-02-24 LAB — RAD ONC ARIA SESSION SUMMARY
Course Elapsed Days: 25
Plan Fractions Treated to Date: 9
Plan Prescribed Dose Per Fraction: 2 Gy
Plan Total Fractions Prescribed: 15
Plan Total Prescribed Dose: 30 Gy
Reference Point Dosage Given to Date: 38 Gy
Reference Point Session Dosage Given: 2 Gy
Session Number: 19

## 2022-02-24 NOTE — Progress Notes (Signed)
[place] CSW Progress Note  Clinical Education officer, museum contacted caregiver by phone to discuss resource for in-home assistance.  CSW spoke with Phillip [name] who stated she was aware medicare would not be abel to assist and also stated she had not called any of the agencies from the private duty-caregiver list we gave her earlier this week. Phillip [name] also stated her daughter has offered to give her a break two nights a week but she has not accepted the offer.  CSW and Phillip [name] dicussed allowing her daughter to assist since she is offering help.  CSW encouraged Phillip [name] to contact agencies from the caregiver list, to assist with daily care giving tasks.  Phillip [name] stated she would call the agencies.    Adelene Amas, LCSW

## 2022-02-24 NOTE — Telephone Encounter (Signed)
I spoke with patient's wife by phone.  She describes worsening weakness/declining performance status.  She feels like this is secondary to Lyrica.  Apparently, patient disagrees.  I recommended that she dose reduce to nightly only and then may hold if no improvement.  Wife is requesting assistance with in-home care, medication management, and ADLs.  I had previously recommended that she call her insurance agent and take advantage of their long-term care insurance policy.  She says that she will reach out to them today.  We will proceed with home health referral and community palliative care.  We will also involve our Education officer, museum for further discussions regarding resources.

## 2022-02-24 NOTE — Telephone Encounter (Signed)
Referral Discussed with Corene Cornea at [facility]. Unable to start Nursing care with [facility] at this time. However, referral can be accepted for PT and [place] for ADLs. Referral start date changed from "next week" to start date of 02/28/22 per request of Home Health.

## 2022-02-24 NOTE — Addendum Note (Signed)
Addended by: Gloris Ham on: 02/24/2022 02:24 PM   Modules accepted: Orders

## 2022-02-27 ENCOUNTER — Encounter: Payer: Self-pay | Admitting: Oncology

## 2022-02-27 ENCOUNTER — Ambulatory Visit
Admission: RE | Admit: 2022-02-27 | Discharge: 2022-02-27 | Disposition: A | Discharge status: Home / Self Care | Payer: PPO | Source: Ambulatory Visit | Attending: Radiation Oncology | Admitting: Radiation Oncology

## 2022-02-27 ENCOUNTER — Other Ambulatory Visit: Payer: Self-pay

## 2022-02-27 ENCOUNTER — Inpatient Hospital Stay: Payer: PPO

## 2022-02-27 ENCOUNTER — Telehealth: Payer: Self-pay

## 2022-02-27 ENCOUNTER — Inpatient Hospital Stay (HOSPITAL_BASED_OUTPATIENT_CLINIC_OR_DEPARTMENT_OTHER): Payer: PPO | Admitting: Oncology

## 2022-02-27 ENCOUNTER — Ambulatory Visit: Payer: PPO

## 2022-02-27 VITALS — BP 119/90 | HR 84

## 2022-02-27 VITALS — BP 110/72 | HR 78 | Temp 97.9°F | Resp 16 | Ht 74.0 in | Wt 161.0 lb

## 2022-02-27 DIAGNOSIS — Z5111 Encounter for antineoplastic chemotherapy: Secondary | ICD-10-CM

## 2022-02-27 DIAGNOSIS — C069 Malignant neoplasm of mouth, unspecified: Secondary | ICD-10-CM

## 2022-02-27 DIAGNOSIS — E440 Moderate protein-calorie malnutrition: Secondary | ICD-10-CM | POA: Diagnosis not present

## 2022-02-27 DIAGNOSIS — G893 Neoplasm related pain (acute) (chronic): Secondary | ICD-10-CM

## 2022-02-27 DIAGNOSIS — C4402 Squamous cell carcinoma of skin of lip: Secondary | ICD-10-CM | POA: Diagnosis not present

## 2022-02-27 LAB — CBC WITH DIFFERENTIAL/PLATELET
Abs Immature Granulocytes: 0.02 10*3/uL (ref 0.00–0.07)
Basophils Absolute: 0 10*3/uL (ref 0.0–0.1)
Basophils Relative: 0 %
Eosinophils Absolute: 0 10*3/uL (ref 0.0–0.5)
Eosinophils Relative: 0 %
HCT: 39.9 % (ref 39.0–52.0)
Hemoglobin: 13.6 g/dL (ref 13.0–17.0)
Immature Granulocytes: 0 %
Lymphocytes Relative: 16 %
Lymphs Abs: 0.9 10*3/uL (ref 0.7–4.0)
MCH: 33.2 pg (ref 26.0–34.0)
MCHC: 34.1 g/dL (ref 30.0–36.0)
MCV: 97.3 fL (ref 80.0–100.0)
Monocytes Absolute: 0.4 10*3/uL (ref 0.1–1.0)
Monocytes Relative: 7 %
Neutro Abs: 4.5 10*3/uL (ref 1.7–7.7)
Neutrophils Relative %: 77 %
Platelets: 144 10*3/uL — ABNORMAL LOW (ref 150–400)
RBC: 4.1 MIL/uL — ABNORMAL LOW (ref 4.22–5.81)
RDW: 12.8 % (ref 11.5–15.5)
WBC: 5.9 10*3/uL (ref 4.0–10.5)
nRBC: 0 % (ref 0.0–0.2)

## 2022-02-27 LAB — COMPREHENSIVE METABOLIC PANEL
ALT: 41 U/L (ref 0–44)
AST: 36 U/L (ref 15–41)
Albumin: 3.2 g/dL — ABNORMAL LOW (ref 3.5–5.0)
Alkaline Phosphatase: 46 U/L (ref 38–126)
Anion gap: 10 (ref 5–15)
BUN: 27 mg/dL — ABNORMAL HIGH (ref 8–23)
CO2: 25 mmol/L (ref 22–32)
Calcium: 8.8 mg/dL — ABNORMAL LOW (ref 8.9–10.3)
Chloride: 102 mmol/L (ref 98–111)
Creatinine, Ser: 0.97 mg/dL (ref 0.61–1.24)
GFR, Estimated: >60 mL/min (ref >60)
Glucose, Bld: 159 mg/dL — ABNORMAL HIGH (ref 70–99)
Potassium: 4.2 mmol/L (ref 3.5–5.1)
Sodium: 137 mmol/L (ref 135–145)
Total Bilirubin: 1.3 mg/dL — ABNORMAL HIGH (ref 0.3–1.2)
Total Protein: 5.9 g/dL — ABNORMAL LOW (ref 6.5–8.1)

## 2022-02-27 LAB — RAD ONC ARIA SESSION SUMMARY
Course Elapsed Days: 28
Plan Fractions Treated to Date: 10
Plan Prescribed Dose Per Fraction: 2 Gy
Plan Total Fractions Prescribed: 15
Plan Total Prescribed Dose: 30 Gy
Reference Point Dosage Given to Date: 40 Gy
Reference Point Session Dosage Given: 2 Gy
Session Number: 20

## 2022-02-27 MED ORDER — SODIUM CHLORIDE 0.9 % IV SOLN
50.0000 mg/m2 | Freq: Once | INTRAVENOUS | Status: AC
  Administered 2022-02-27: 102 mg via INTRAVENOUS
  Filled 2022-02-27: qty 17

## 2022-02-27 MED ORDER — SODIUM CHLORIDE 0.9 % IV SOLN
Freq: Once | INTRAVENOUS | Status: AC
  Filled 2022-02-27: qty 250

## 2022-02-27 MED ORDER — SODIUM CHLORIDE 0.9 % IV SOLN
176.8000 mg | Freq: Once | INTRAVENOUS | Status: AC
  Administered 2022-02-27: 180 mg via INTRAVENOUS
  Filled 2022-02-27: qty 18

## 2022-02-27 MED ORDER — HEPARIN SOD (PORK) LOCK FLUSH 100 UNIT/ML IV SOLN
500.0000 [IU] | Freq: Once | INTRAVENOUS | Status: AC
  Administered 2022-02-27: 500 [IU] via INTRAVENOUS
  Filled 2022-02-27: qty 5

## 2022-02-27 MED ORDER — HEPARIN SOD (PORK) LOCK FLUSH 100 UNIT/ML IV SOLN
500.0000 [IU] | Freq: Once | INTRAVENOUS | Status: DC | PRN
  Filled 2022-02-27: qty 5

## 2022-02-27 MED ORDER — DIPHENHYDRAMINE HCL 50 MG/ML IJ SOLN
50.0000 mg | Freq: Once | INTRAMUSCULAR | Status: AC
  Administered 2022-02-27: 50 mg via INTRAVENOUS
  Filled 2022-02-27: qty 1

## 2022-02-27 MED ORDER — SODIUM CHLORIDE 0.9 % IV SOLN
10.0000 mg | Freq: Once | INTRAVENOUS | Status: AC
  Administered 2022-02-27: 10 mg via INTRAVENOUS
  Filled 2022-02-27: qty 1

## 2022-02-27 MED ORDER — PALONOSETRON HCL INJECTION 0.25 MG/5ML
0.2500 mg | Freq: Once | INTRAVENOUS | Status: AC
  Administered 2022-02-27: 0.25 mg via INTRAVENOUS
  Filled 2022-02-27: qty 5

## 2022-02-27 MED ORDER — FAMOTIDINE IN NACL 20-0.9 MG/50ML-% IV SOLN
20.0000 mg | Freq: Once | INTRAVENOUS | Status: AC
  Administered 2022-02-27: 20 mg via INTRAVENOUS
  Filled 2022-02-27: qty 50

## 2022-02-27 MED ORDER — FLUCONAZOLE 200 MG PO TABS: 200.0000 mg | ORAL_TABLET | Freq: Every day | ORAL | 0 refills | Status: AC

## 2022-02-27 NOTE — Patient Instructions (Signed)
[facility]  Discharge Instructions: Thank you for choosing [facility] to provide your oncology and hematology care.  If you have a lab appointment with the Cancer Center, please go directly to the Cancer Center and check in at the registration area.  Wear comfortable clothing and clothing appropriate for easy access to any Portacath or PICC line.   We strive to give you quality time with your provider. You may need to reschedule your appointment if you arrive late (15 or more minutes).  Arriving late affects you and other patients whose appointments are after yours.  Also, if you miss three or more appointments without notifying the office, you may be dismissed from the clinic at the provider's discretion.      For prescription refill requests, have your pharmacy contact our office and allow 72 hours for refills to be completed.    Today you received the following chemotherapy and/or immunotherapy agents Taxol and Carboplatin       To help prevent nausea and vomiting after your treatment, we encourage you to take your nausea medication as directed.  BELOW ARE SYMPTOMS THAT SHOULD BE REPORTED IMMEDIATELY: *FEVER GREATER THAN 100.4 F (38 C) OR HIGHER *CHILLS OR SWEATING *NAUSEA AND VOMITING THAT IS NOT CONTROLLED WITH YOUR NAUSEA MEDICATION *UNUSUAL SHORTNESS OF BREATH *UNUSUAL BRUISING OR BLEEDING *URINARY PROBLEMS (pain or burning when urinating, or frequent urination) *BOWEL PROBLEMS (unusual diarrhea, constipation, pain near the anus) TENDERNESS IN MOUTH AND THROAT WITH OR WITHOUT PRESENCE OF ULCERS (sore throat, sores in mouth, or a toothache) UNUSUAL RASH, SWELLING OR PAIN  UNUSUAL VAGINAL DISCHARGE OR ITCHING   Items with * indicate a potential emergency and should be followed up as soon as possible or go to the Emergency Department if any problems should occur.  Please show the CHEMOTHERAPY ALERT CARD or IMMUNOTHERAPY ALERT CARD at  check-in to the Emergency Department and triage nurse.  Should you have questions after your visit or need to cancel or reschedule your appointment, please contact [facility]  [phone] and follow the prompts.  Office hours are 8:00 a.m. to 4:30 p.m. Monday - Friday. Please note that voicemails left after 4:00 p.m. may not be returned until the following business day.  We are closed weekends and major holidays. You have access to a nurse at all times for urgent questions. Please call the main number to the clinic [phone] and follow the prompts.  For any non-urgent questions, you may also contact your provider using MyChart. We now offer e-Visits for anyone 18 and older to request care online for non-urgent symptoms. For details visit [email or web address].   Also download the MyChart app! Go to the app store, search "MyChart", open the app, select [facility], and log in with your MyChart username and password.  Masks are optional in the cancer centers. If you would like for your care team to wear a mask while they are taking care of you, please let them know. For doctor visits, patients may have with them one support person who is at least 82 years old. At this time, visitors are not allowed in the infusion area.  

## 2022-02-27 NOTE — Telephone Encounter (Signed)
PDL-1 form filled out and faxed to Pathology dept.

## 2022-02-27 NOTE — Progress Notes (Signed)
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  He presented to ENT with symptoms of her right cheek mucosal irritation which did not get better after topical remedies.  This was ultimately biopsied by Dr. Tami Ribas and came back positive for squamous cell carcinoma while differentiated and keratinizing.  This has not been deemed to be resectable by ENT and therefore referred for consideration of radiation or chemotherapy.  PET CT scan did not show anyEvidence of locoregional or distant metastatic disease.  Patient received definitive radiation to this area.  A few weeks after radiation was completed patient developed a rapidly growing mass anterior to his original site of disease involvement.   He had a repeat biopsy by ENT which showed squamous cell carcinoma with basaloid features and minimal focal keratinization.  Repeat PET CT scan showed3.4 x 2.2 cm ill-defined soft tissue mass within the soft tissues lateral to the mandible on the right side.  Hypermetabolic activity in the right lateral  submandibular space at the level of the angle of mandible with an SUV of 4.5 suspicious for 1B nodal metastases.  Small hypermetabolic level 3 activity bilaterally with an SUV of 3.9 and 3.5 respectively concerning for possible disease involvement.  No evidence of distant metastatic disease.     Interval history-lip lesion is necrotic and has a constant seropurulent discharge from it.  Oral intake has been poor.  He is hesitant to take narcotic pain medications and only takes Lyrica.  He has lost 10 pounds in the last 1 week  ECOG PS- 2 Pain scale- 4 Opioid associated constipation- no  Review of systems- Review of Systems  Constitutional:  Positive for malaise/fatigue and weight loss.  HENT:         Mouth pain and difficulty swallowing      Allergies  Allergen Reactions   Ace Inhibitors Swelling and Other (See Comments)    Possible angioedema (upper lip swelling)   Morphine And Related Swelling    SWELLING REACTION UNSPECIFIED  [severity rated per PMH, 12/11/2017]   Motrin [Ibuprofen]      Past Medical History:  Diagnosis Date   Anxiety    Basal cell carcinoma 09/12/2018   Left forehead above lateral brow. Nodular pattern   Cancer ([place])    Coronary artery disease 2003   a. nonobstructive CAD by cath in 2003 and 2005; b.  Status post three-vessel CABG on 12/12/2017 with LIMA to LAD, sequential reverse SVG to OM1 and distal LCx   COVID-19 03/2020   Diverticulosis    Hearing loss    bilateral   Heart disease    Heart murmur    History of benign prostatic hypertrophy    History of  radiation therapy 01/31/10-03/22/10   right tonsil/right neck node 7000 cGy 35 sessions, high risk lymph node volume 5940 cGy 35 sessions, low risk lymph node vol 5600 cGy 35 sessions   HPV in male    positive   HTN (hypertension)    Hyperlipidemia    diet controlled- taking medication d/t ensure drinking   Hypothyroid 01/16/2013   Ischemic cardiomyopathy    a. 10/2017: echo showing reduced EF of  40-45%, HK of the anterior, anteroseptal and apical myocardium with Grade 1 DD.    Neck pain    Persistent atrial fibrillation (HCC)    Primary squamous cell carcinoma of skin of lip 2023   lip and tonuge   Squamous cell carcinoma    right tonsil- 2011   Squamous cell carcinoma of skin 09/02/2019   Crown scalp. WD SCC   Squamous cell carcinoma of skin 09/02/2019   Left distal lateral deltoid. SCCis arising in SK   Thrombocytopenia ([place]) 02/02/2012   unclear etiology   Tinnitus    Tubular adenoma of colon      Past Surgical History:  Procedure Laterality Date   artoscopic knee     CARDIAC CATHETERIZATION  2003, 2005   40% blockage, two 30% blockages treated medically Ron Parker)   CARDIOVERSION N/A 02/12/2018   Procedure: CARDIOVERSION;  Surgeon: [name], MD;  Location: [facility];  Service: Cardiovascular;  Laterality: N/A;   CARDIOVERSION N/A 05/10/2021   Procedure: CARDIOVERSION;  Surgeon: [name], MD;  Location: [facility];  Service: Cardiovascular;  Laterality: N/A;   COLONOSCOPY  03/2015   TA x3, HP, melanosis coli, severe diverticulosis, rpt 3 yrs ([place])   COLONOSCOPY  01/2019   fair prep, diverticulosis, f/u PRN ([place])   CORONARY ARTERY BYPASS GRAFT N/A 12/12/2017   Procedure: CORONARY ARTERY BYPASS GRAFTING (CABG) x3 using the right greater saphenous vein harvested endoscopically and the left internal mammary artery. LIMA to LAD, SEQ SVG to OM1 & OM2;  Surgeon: [name], MD;  Location: [facility];  Service: Open Heart Surgery;  Laterality: N/A;   DENTAL SURGERY     extractions   IR THORACENTESIS ASP PLEURAL SPACE W/IMG GUIDE  01/17/2018   KNEE ARTHROSCOPY Left    LEFT HEART CATH AND CORONARY ANGIOGRAPHY N/A 12/10/2017   Procedure: LEFT HEART CATH AND CORONARY ANGIOGRAPHY;  Surgeon: [name], MD;  Location: [facility];  Service: Cardiovascular;  Laterality: N/A;   PEG PLACEMENT  02/2010   PORTA CATH INSERTION N/A 01/26/2022   Procedure: PORTA  CATH INSERTION;  Surgeon: Katha Cabal, MD;  Location: [facility];  Service: Cardiovascular;  Laterality: N/A;   TEE WITHOUT CARDIOVERSION N/A 12/12/2017   Procedure: TRANSESOPHAGEAL ECHOCARDIOGRAM (TEE);  Surgeon: [name], MD;  Location: [facility];  Service: Open Heart Surgery;  Laterality: N/A;   TONSILLECTOMY     triple bypass  11/2017   VASECTOMY      Social History   Socioeconomic History   Marital status: Married    Spouse name: Electrical engineer   Number of children: 1   Years of education: Not on file   Highest education level: Not on file  Occupational History   Not on file  Tobacco Use   Smoking status: Never   Smokeless tobacco: Never  Vaping Use   Vaping Use: Never used  Substance and Sexual Activity   Alcohol use: Not Currently    Comment: glass of wine 1-2 times a month   Drug use: No  Sexual activity: Yes  Other Topics Concern   Not on file  Social History Narrative   Married 8 years- divorced; remarried 1994 Optometrist)   Forensic psychologist    Work; Film/video editor; 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    Plays golf, remains very active with an interest in politics. Jan 2011 moved to area.    Involved in [employer].    He is a runner - has run [employer] and [employer]. S/p torn meniscus.    Social Determinants of Health   Financial Resource Strain: Low Risk  (04/28/2020)   Overall Financial Resource Strain (CARDIA)    Difficulty of Paying Living Expenses: Not hard at all  Food Insecurity: No Food Insecurity (04/28/2020)   Hunger Vital Sign    Worried About Running Out of Food in the Last Year: Never true    Ran Out of Food in the Last Year: Never true  Transportation Needs: No Transportation Needs (04/28/2020)   PRAPARE - Hydrologist (Medical): No    Lack of Transportation (Non-Medical): No  Physical Activity: Sufficiently Active (04/28/2020)   Exercise  Vital Sign    Days of Exercise per Week: 7 days    Minutes of Exercise per Session: 30 min  Stress: No Stress Concern Present (04/28/2020)   [place]    Feeling of Stress : Not at all  Social Connections: Unknown (12/11/2017)   Social Connection and Isolation Panel [NHANES]    Frequency of Communication with Friends and Family: Patient refused    Frequency of Social Gatherings with Friends and Family: Patient refused    Attends Religious Services: Patient refused    Active Member of Clubs or Organizations: Patient refused    Attends Archivist Meetings: Patient refused    Marital Status: Patient refused  Intimate Partner Violence: Not At Risk (04/28/2020)   Humiliation, Afraid, Rape, and Kick questionnaire    Fear of Current or Ex-Partner: No    Emotionally Abused: No    Physically Abused: No    Sexually Abused: No    Family History  Problem Relation Age of Onset   Coronary artery disease Father    Heart attack Father 37   Heart disease Father    Stroke Mother    Colon cancer Neg Hx    Esophageal cancer Neg Hx    Rectal cancer Neg Hx    Stomach cancer Neg Hx    Kidney cancer Neg Hx    Kidney failure Neg Hx    Prostate cancer Neg Hx    Tuberculosis Neg Hx      Current Outpatient Medications:    amLODipine (NORVASC) 5 MG tablet, Take 1 tablet (5 mg total) by mouth daily., Disp: 30 tablet, Rfl: 0   apixaban (ELIQUIS) 5 MG TABS tablet, Take 1 tablet (5 mg total) by mouth 2 (two) times daily. Please restart your Eliquis tomorrow morning, Disp: 60 tablet, Rfl: 5   Benzocaine (HURRICAINE) 20 % AERO, Use as directed 1 Application in the mouth or throat as needed., Disp: 57 g, Rfl: 3   Cholecalciferol (VITAMIN D3) 25 MCG (1000 UT) CAPS, Take 1 capsule by mouth daily., Disp: , Rfl:    dexamethasone (DECADRON) 4 MG tablet, Take 2 tablets (8 mg total) by mouth daily. Start the day after chemotherapy for 2 days.,  Disp: 30 tablet, Rfl: 1   docusate sodium (COLACE) 250  MG capsule, Takes total '600mg'$  /day, Disp: , Rfl:    fluocinonide gel (LIDEX) 0.05 %, , Disp: , Rfl:    FLUoxetine (PROZAC) 20 MG capsule, Take 1 capsule (20 mg total) by mouth daily., Disp: 30 capsule, Rfl: 6   furosemide (LASIX) 20 MG tablet, Take 1 tablet by mouth once daily, Disp: 90 tablet, Rfl: 0   levothyroxine (EUTHYROX) 50 MCG tablet, Take 1 tablet (50 mcg total) by mouth daily before breakfast., Disp: 30 tablet, Rfl: 8   lidocaine-prilocaine (EMLA) cream, Apply to affected area once, Disp: 30 g, Rfl: 3   MAGNESIUM PO, Take 1 tablet by mouth daily., Disp: , Rfl:    Multiple Vitamins-Minerals (ZINC PO), Take by mouth daily., Disp: , Rfl:    nitroGLYCERIN (NITROSTAT) 0.4 MG SL tablet, Place 1 tablet (0.4 mg total) under the tongue every 5 (five) minutes as needed for chest pain (Maximum 3 doses.)., Disp: 25 tablet, Rfl: 2   Omega-3 Fatty Acids (OMEGA 3 PO), Take by mouth daily., Disp: , Rfl:    ondansetron (ZOFRAN) 8 MG tablet, Take 1 tablet (8 mg total) by mouth 2 (two) times daily as needed for refractory nausea / vomiting. Start on day 3 after chemo., Disp: 30 tablet, Rfl: 1   ondansetron (ZOFRAN-ODT) 4 MG disintegrating tablet, Take 1 tablet (4 mg total) by mouth every 8 (eight) hours as needed for nausea or vomiting., Disp: 20 tablet, Rfl: 0   polyethylene glycol (MIRALAX / GLYCOLAX) 17 g packet, Take 17 g by mouth daily as needed., Disp: , Rfl:    Potassium 99 MG TABS, Take 1 tablet by mouth daily., Disp: , Rfl:    pregabalin (LYRICA) 75 MG capsule, Take 1 capsule (75 mg total) by mouth 2 (two) times daily., Disp: 56 capsule, Rfl: 3   prochlorperazine (COMPAZINE) 10 MG tablet, Take 1 tablet (10 mg total) by mouth every 6 (six) hours as needed (Nausea or vomiting)., Disp: 30 tablet, Rfl: 1   rosuvastatin (CRESTOR) 20 MG tablet, Take 1 tablet by mouth once daily, Disp: 90 tablet, Rfl: 3   traMADol (ULTRAM) 50 MG tablet, Take 1 tablet  (50 mg total) by mouth every 6 (six) hours as needed., Disp: 30 tablet, Rfl: 0   VITAMIN A PO, Take 2,400 mcg by mouth daily. , Disp: , Rfl:    vitamin E 400 UNIT capsule, Take 400 Units by mouth daily., Disp: , Rfl:    zaleplon (SONATA) 10 MG capsule, Take 1 capsule (10 mg total) by mouth at bedtime as needed for sleep., Disp: 30 capsule, Rfl: 0   Zinc 50 MG CAPS, Take 50 mg by mouth daily., Disp: , Rfl:  No current facility-administered medications for this visit.  Facility-Administered Medications Ordered in Other Visits:    0.9 %  sodium chloride infusion, , Intravenous, Once, Sindy Guadeloupe, MD  Physical exam:  Vitals:   02/27/22 0901  BP: 110/72  Pulse: 78  Resp: 16  Temp: 97.9 F (36.6 C)  TempSrc: Tympanic  SpO2: 98%  Weight: 161 lb (73 kg)  Height: '6\' 2"'$  (1.88 m)   Physical Exam Constitutional:      Comments: Thin elderly gentleman sitting in a wheelchair.  Necrotic lip lesion with constant drainage  HENT:     Mouth/Throat:     Comments: Ritta Slot is present. Cardiovascular:     Rate and Rhythm: Normal rate and regular rhythm.     Heart sounds: Normal heart sounds.  Pulmonary:     Effort: Pulmonary  effort is normal.     Breath sounds: Normal breath sounds.  Abdominal:     General: Bowel sounds are normal.     Palpations: Abdomen is soft.  Skin:    General: Skin is warm and dry.  Neurological:     Mental Status: He is alert and oriented to person, place, and time.         Latest Ref Rng & Units 02/27/2022    8:45 AM  CMP  Glucose 70 - 99 mg/dL 159   BUN 8 - 23 mg/dL 27   Creatinine 0.61 - 1.24 mg/dL 0.97   Sodium 135 - 145 mmol/L 137   Potassium 3.5 - 5.1 mmol/L 4.2   Chloride 98 - 111 mmol/L 102   CO2 22 - 32 mmol/L 25   Calcium 8.9 - 10.3 mg/dL 8.8   Total Protein 6.5 - 8.1 g/dL 5.9   Total Bilirubin 0.3 - 1.2 mg/dL 1.3   Alkaline Phos 38 - 126 U/L 46   AST 15 - 41 U/L PENDING   ALT 0 - 44 U/L 41       Latest Ref Rng & Units 02/27/2022    8:45 AM   CBC  WBC 4.0 - 10.5 K/uL 5.9   Hemoglobin 13.0 - 17.0 g/dL 13.6   Hematocrit 39.0 - 52.0 % 39.9   Platelets 150 - 400 K/uL 144      Assessment and plan- Patient is a 81 y.o. male  with recurrent squamous cell carcinoma stage III T3 N1 M0.  He is here for on treatment assessment prior to cycle 5 of weekly CarboTaxol chemotherapy concurrent with radiation   Counts okay to proceed with cycle 5 of weekly CarboTaxol chemotherapy today.  He will get cycle 6 next week which would be his last cycle.  I will proceed with a PET scan roughly 8 weeks from now.  Depending on his performance status, treatment response on PET scan and overall goals of care I will decide if he requires any consolidative chemotherapy thereafter.  1 L of IV fluids today and then on a weekly basis.  He will see NP Altha Harm in 2 weeks.  Given his ongoing weight loss and poor oral intake I will refer him to general surgery for PEG tube placement.  He is already following up with nutrition.  Once he gets a feeding tube in place they will coordinate with him about PEG tube feeds.  I am prescribing fluconazole 200 mg daily for 7 days for his thrush.    Visit Diagnosis 1. Squamous cell carcinoma of oral cavity (HCC)   2. Encounter for antineoplastic chemotherapy   3. Moderate protein-calorie malnutrition ([place])      Dr. Randa Evens, MD, MPH [facility] [phone] 02/27/2022 9:53 AM

## 2022-02-28 ENCOUNTER — Ambulatory Visit
Admission: RE | Admit: 2022-02-28 | Discharge: 2022-02-28 | Disposition: A | Discharge status: Home / Self Care | Payer: PPO | Source: Ambulatory Visit | Attending: Radiation Oncology | Admitting: Radiation Oncology

## 2022-02-28 ENCOUNTER — Ambulatory Visit: Payer: PPO

## 2022-02-28 ENCOUNTER — Other Ambulatory Visit: Payer: Self-pay | Admitting: Internal Medicine

## 2022-02-28 ENCOUNTER — Other Ambulatory Visit: Payer: Self-pay

## 2022-02-28 DIAGNOSIS — Z5111 Encounter for antineoplastic chemotherapy: Secondary | ICD-10-CM | POA: Diagnosis not present

## 2022-02-28 DIAGNOSIS — C4402 Squamous cell carcinoma of skin of lip: Secondary | ICD-10-CM | POA: Diagnosis not present

## 2022-02-28 LAB — RAD ONC ARIA SESSION SUMMARY
Course Elapsed Days: 29
Plan Fractions Treated to Date: 11
Plan Prescribed Dose Per Fraction: 2 Gy
Plan Total Fractions Prescribed: 15
Plan Total Prescribed Dose: 30 Gy
Reference Point Dosage Given to Date: 42 Gy
Reference Point Session Dosage Given: 2 Gy
Session Number: 21

## 2022-03-01 ENCOUNTER — Ambulatory Visit: Payer: PPO

## 2022-03-01 ENCOUNTER — Telehealth: Payer: Self-pay

## 2022-03-01 ENCOUNTER — Ambulatory Visit
Admission: RE | Admit: 2022-03-01 | Discharge: 2022-03-01 | Disposition: A | Discharge status: Home / Self Care | Payer: PPO | Source: Ambulatory Visit | Attending: Radiation Oncology | Admitting: Radiation Oncology

## 2022-03-01 ENCOUNTER — Other Ambulatory Visit: Payer: Self-pay

## 2022-03-01 ENCOUNTER — Ambulatory Visit: Payer: PPO | Admitting: Surgery

## 2022-03-01 ENCOUNTER — Encounter: Payer: Self-pay | Admitting: Surgery

## 2022-03-01 VITALS — BP 116/80 | HR 72 | Temp 98.7°F | Ht 74.0 in | Wt 162.2 lb

## 2022-03-01 DIAGNOSIS — Z5111 Encounter for antineoplastic chemotherapy: Secondary | ICD-10-CM | POA: Diagnosis not present

## 2022-03-01 DIAGNOSIS — C060 Malignant neoplasm of cheek mucosa: Secondary | ICD-10-CM

## 2022-03-01 DIAGNOSIS — C4402 Squamous cell carcinoma of skin of lip: Secondary | ICD-10-CM | POA: Diagnosis not present

## 2022-03-01 LAB — RAD ONC ARIA SESSION SUMMARY
Course Elapsed Days: 30
Plan Fractions Treated to Date: 12
Plan Prescribed Dose Per Fraction: 2 Gy
Plan Total Fractions Prescribed: 15
Plan Total Prescribed Dose: 30 Gy
Reference Point Dosage Given to Date: 44 Gy
Reference Point Session Dosage Given: 2 Gy
Session Number: 22

## 2022-03-01 NOTE — Telephone Encounter (Signed)
Cardiology Clearance faxed to Dr. Harrell Gave End.

## 2022-03-01 NOTE — Patient Instructions (Signed)
Our surgery scheduler will call you within 24-48 hours to schedule your surgery. Please have the [facility] surgery sheet available when speaking with her,

## 2022-03-01 NOTE — Consult Note (Signed)
Patient ID: Phillip [name], male   DOB: Aug 06, 1941, 81 y.o.   MRN: [id number]  HPI Phillip [name] is a 81 y.o. 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  He presented to ENT with symptoms of her right cheek mucosal irritation which did not get better after topical remedies.  This was ultimately biopsied by Dr. Tami Ribas and came back positive for squamous cell carcinoma while differentiated and keratinizing.  This has not been deemed to be resectable by ENT and therefore referred for consideration of radiation or chemotherapy.  PET CT scan did not show anyEvidence of locoregional or distant metastatic disease.  Patient received definitive radiation to this area.  A few weeks after radiation was completed patient developed a rapidly growing mass anterior to his original site of disease involvement.   He had a repeat biopsy by ENT which showed squamous cell carcinoma with basaloid features and minimal focal keratinization.  Repeat PET CT scan showed3.4 x 2.2 cm ill-defined soft tissue mass within the soft tissues lateral to the mandible on the right side.  Hypermetabolic activity in the right lateral submandibular space at the level of the angle of mandible with an SUV of 4.5 suspicious for 1B nodal metastases.  Small hypermetabolic level 3 activity bilaterally with an SUV of 3.9 and 3.5 respectively concerning for possible disease involvement.  No evidence of distant metastatic disease.  Please note that I personally reviewed the images.  I have also personally reviewed the patient and seen the patient.  Initially he was very frustrated because he had to wait about 40 minutes.  I did talk to him I was very candid.  He was actually an add-on and I have been notified of the patient in less than 48 hours ago.  I have gone above and beyond to do the right thing for this patient.   After a discussion with the patient he is willing to follow medical advice.  He notices significant pain on the right neck and right mandible and lip.  He continues have purulent and necrotic drainage from the right side of his mouth.  He has been losing weight and is currently malnourished.  He has been only on a liquid diet.  He has a prior history of radiation to his neck as well as chemotherapy. + weight loss    HPI  Past Medical History:  Diagnosis Date   Anxiety    Basal cell carcinoma 09/12/2018   Left forehead above lateral brow. Nodular pattern   Cancer ([place])    Coronary artery disease 2003   a. nonobstructive CAD by cath in 2003 and 2005; b.  Status post three-vessel CABG on 12/12/2017 with LIMA to LAD, sequential reverse SVG to OM1 and distal LCx   COVID-19 03/2020   Diverticulosis    Hearing loss    bilateral   Heart disease    Heart murmur    History of benign prostatic hypertrophy    History of radiation therapy 01/31/10-03/22/10   right tonsil/right neck node 7000 cGy 35 sessions, high risk lymph node volume 5940 cGy 35 sessions, low risk lymph node vol 5600 cGy 35 sessions   HPV in male    positive   HTN (hypertension)    Hyperlipidemia    diet controlled- taking medication d/t ensure drinking   Hypothyroid 01/16/2013   Ischemic cardiomyopathy    a. 10/2017:  echo showing reduced EF of 40-45%, HK of the anterior, anteroseptal and apical myocardium with Grade 1 DD.    Neck pain    Persistent atrial fibrillation (H[facility])    Primary squamous cell carcinoma of skin of lip 2023   lip and tonuge   Squamous cell carcinoma    right tonsil- 2011   Squamous cell carcinoma of skin 09/02/2019   Crown scalp. WD SCC   Squamous cell carcinoma of skin 09/02/2019   Left distal lateral deltoid. SCCis arising in SK   Thrombocytopenia ([place]) 02/02/2012   unclear etiology   Tinnitus    Tubular adenoma of colon     Past Surgical History:  Procedure Laterality Date   artoscopic knee      CARDIAC CATHETERIZATION  2003, 2005   40% blockage, two 30% blockages treated medically Ron [facility])   CARDIOVERSION N/A 02/12/2018   Procedure: CARDIOVERSION;  Surgeon: [name], MD;  Location: [facility];  Service: Cardiovascular;  Laterality: N/A;   CARDIOVERSION N/A 05/10/2021   Procedure: CARDIOVERSION;  Surgeon: [name], MD;  Location: [facility];  Service: Cardiovascular;  Laterality: N/A;   COLONOSCOPY  03/2015   TA x3, HP, melanosis coli, severe diverticulosis, rpt 3 yrs ([facility])   COLONOSCOPY  01/2019   fair prep, diverticulosis, f/u PRN ([facility])   CORONARY ARTERY BYPASS GRAFT N/A 12/12/2017   Procedure: CORONARY ARTERY BYPASS GRAFTING (CABG) x3 using the right greater saphenous vein harvested endoscopically and the left internal mammary artery. LIMA to LAD, SEQ SVG to OM1 & OM2;  Surgeon: [name], MD;  Location: [facility];  Service: Open Heart Surgery;  Laterality: N/A;   DENTAL SURGERY     extractions   IR THORACENTESIS ASP PLEURAL SPACE W/IMG GUIDE  01/17/2018   KNEE ARTHROSCOPY Left    LEFT HEART CATH AND CORONARY ANGIOGRAPHY N/A 12/10/2017   Procedure: LEFT HEART CATH AND CORONARY ANGIOGRAPHY;  Surgeon: [name], MD;  Location: [facility];  Service: Cardiovascular;  Laterality: N/A;   PEG PLACEMENT  02/2010   PORTA CATH INSERTION N/A 01/26/2022   Procedure: PORTA CATH INSERTION;  Surgeon: Katha Cabal, MD;  Location: [facility];  Service: Cardiovascular;  Laterality: N/A;   TEE WITHOUT CARDIOVERSION N/A 12/12/2017   Procedure: TRANSESOPHAGEAL ECHOCARDIOGRAM (TEE);  Surgeon: [name], MD;  Location: [facility];  Service: Open Heart Surgery;  Laterality: N/A;   TONSILLECTOMY     triple bypass  11/2017   VASECTOMY      Family History  Problem Relation Age of Onset   Coronary artery disease Father    Heart attack Father 70   Heart disease Father    Stroke Mother    Colon cancer Neg Hx    Esophageal cancer Neg Hx    Rectal  cancer Neg Hx    Stomach cancer Neg Hx    Kidney cancer Neg Hx    Kidney failure Neg Hx    Prostate cancer Neg Hx    Tuberculosis Neg Hx     Social History Social History   Tobacco Use   Smoking status: Never   Smokeless tobacco: Never  Vaping Use   Vaping Use: Never used  Substance Use Topics   Alcohol use: Not Currently    Comment: glass of wine 1-2 times a month   Drug use: No    Allergies  Allergen Reactions   Ace Inhibitors Swelling and Other (See Comments)    Possible angioedema (upper lip swelling)  Morphine And Related Swelling    SWELLING REACTION UNSPECIFIED  [severity rated per PMH, 12/11/2017]   Motrin [Ibuprofen]     Current Outpatient Medications  Medication Sig Dispense Refill   amLODipine (NORVASC) 5 MG tablet Take 1 tablet by mouth once daily 30 tablet 0   apixaban (ELIQUIS) 5 MG TABS tablet Take 1 tablet (5 mg total) by mouth 2 (two) times daily. Please restart your Eliquis tomorrow morning 60 tablet 5   Benzocaine (HURRICAINE) 20 % AERO Use as directed 1 Application in the mouth or throat as needed. 57 g 3   Cholecalciferol (VITAMIN D3) 25 MCG (1000 UT) CAPS Take 1 capsule by mouth daily.     dexamethasone (DECADRON) 4 MG tablet Take 2 tablets (8 mg total) by mouth daily. Start the day after chemotherapy for 2 days. 30 tablet 1   docusate sodium (COLACE) 250 MG capsule Takes total '600mg'$  /day     fluconazole (DIFLUCAN) 200 MG tablet Take 1 tablet (200 mg total) by mouth daily. 7 tablet 0   fluocinonide gel (LIDEX) 0.05 %      FLUoxetine (PROZAC) 20 MG capsule Take 1 capsule (20 mg total) by mouth daily. 30 capsule 6   furosemide (LASIX) 20 MG tablet Take 1 tablet by mouth once daily 90 tablet 0   levothyroxine (EUTHYROX) 50 MCG tablet Take 1 tablet (50 mcg total) by mouth daily before breakfast. 30 tablet 8   lidocaine-prilocaine (EMLA) cream Apply to affected area once 30 g 3   MAGNESIUM PO Take 1 tablet by mouth daily.     Multiple Vitamins-Minerals  (ZINC PO) Take by mouth daily.     nitroGLYCERIN (NITROSTAT) 0.4 MG SL tablet Place 1 tablet (0.4 mg total) under the tongue every 5 (five) minutes as needed for chest pain (Maximum 3 doses.). 25 tablet 2   Omega-3 Fatty Acids (OMEGA 3 PO) Take by mouth daily.     ondansetron (ZOFRAN) 8 MG tablet Take 1 tablet (8 mg total) by mouth 2 (two) times daily as needed for refractory nausea / vomiting. Start on day 3 after chemo. 30 tablet 1   ondansetron (ZOFRAN-ODT) 4 MG disintegrating tablet Take 1 tablet (4 mg total) by mouth every 8 (eight) hours as needed for nausea or vomiting. 20 tablet 0   polyethylene glycol (MIRALAX / GLYCOLAX) 17 g packet Take 17 g by mouth daily as needed.     Potassium 99 MG TABS Take 1 tablet by mouth daily.     pregabalin (LYRICA) 75 MG capsule Take 1 capsule (75 mg total) by mouth 2 (two) times daily. 56 capsule 3   prochlorperazine (COMPAZINE) 10 MG tablet Take 1 tablet (10 mg total) by mouth every 6 (six) hours as needed (Nausea or vomiting). 30 tablet 1   rosuvastatin (CRESTOR) 20 MG tablet Take 1 tablet by mouth once daily 90 tablet 3   traMADol (ULTRAM) 50 MG tablet Take 1 tablet (50 mg total) by mouth every 6 (six) hours as needed. 30 tablet 0   VITAMIN A PO Take 2,400 mcg by mouth daily.      vitamin E 400 UNIT capsule Take 400 Units by mouth daily.     zaleplon (SONATA) 10 MG capsule Take 1 capsule (10 mg total) by mouth at bedtime as needed for sleep. 30 capsule 0   Zinc 50 MG CAPS Take 50 mg by mouth daily.     No current facility-administered medications for this visit.     Review of Systems  Full ROS  was asked and was negative except for the information on the HPI  Physical Exam Blood pressure 116/80, pulse 72, temperature 98.7 F (37.1 C), temperature source Oral, height '6\' 2"'$  (1.88 m), weight 162 lb 3.2 oz (73.6 kg), SpO2 98 %. CONSTITUTIONAL: malnourished and debilitated . EYES: Pupils are equal, round, , Sclera are non-icteric. EARS, NOSE, MOUTH  AND THROAT:  Large Right buccal necrotic mass, draining copious amount and necrotic and purulent fluid. limited mouth opening , less than 2 cms. Large submandibular right mass with induration, Residual radiation changes to his Neck . Unable to see oropharynx due to limited mouth opening.  The oropharynx is clear.Hearing is intact to voice. LYMPH NODES:  Lymph nodes in the neck are normal. RESPIRATORY:  Lungs are clear. There is normal respiratory effort, with equal breath sounds bilaterally, and without pathologic use of accessory muscles. CARDIOVASCULAR: Heart is regular without murmurs, gallops, or rubs. Prior sternotomy, good chest stability. GI: The abdomen is  soft, nontender, and nondistended. There are no palpable masses. There is no hepatosplenomegaly. There are normal bowel sounds in all quadrants. He does have a prior G tube scar Left upper quadrant. GU: Rectal deferred.   MUSCULOSKELETAL: Normal muscle strength and tone. No cyanosis or edema.   SKIN: Turgor is good and there are no pathologic skin lesions or ulcers. NEUROLOGIC: Motor and sensation is grossly normal. Cranial nerves are grossly intact. PSYCH:  Oriented to person, place and time. He exhibits anger and frustration  Data Reviewed  I have personally reviewed the patient's imaging, laboratory findings and medical records.    Assessment/Plan 81 year old with recurrent squamous cell carcinoma of the head and neck with nodal involvement and unable to undergo a radical resection.  Currently on chemotherapy anticipating some radiation.  I had an extensive discussion with the patient and his wife regarding his disease process.  My main concern is airway.  In order to perform a G-tube either open or laparoscopic or robotic I think that he will require a nasotracheal intubation guided by fiberoptic.  I do not think that a PEG using the scope will be able to be feasible because of his limited mouth mobility.  Options are limited.  I do  think that the best options for him will be to do a either robotic/laparoscopic or open gastrostomy tube in the operating room with a secure airway in nasal tracheal fashion.  I have had also an extensive discussion with Dr. Amie Critchley from anesthesia and they feel that a tertiary center such as [facility] is likely the best place for him as they do the scan of high risk airway on routine basis.  I had an extensive discussion with the wife and she expresses significant concerns regarding his behavior and I have relayed this message to Dr. Janese Banks.  She will schedule neurology consultation.  He might not be a bad idea to perform an MRI of his brain to see if there is evidence of distant metastatic disease or any other organic problems that may explain his behavioral issues.  Please note that I spent greater than 90 minutes in this encounter to include coordination of his care, personally reviewing imaging studies, and performing appropriate documentation   Caroleen Hamman, MD FACS General Surgeon 03/01/2022, 6:25 PM

## 2022-03-01 NOTE — Progress Notes (Signed)
HPI: Phillip [name] is a 81 y.o. male  Patient is a 81 year old male seen in consultation at the request of Dr. 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  He presented to ENT with symptoms of her right cheek mucosal irritation which did not get better after topical remedies.  This was ultimately biopsied by Dr. Tami Ribas and came back positive for squamous cell carcinoma while differentiated and keratinizing.  This has not been deemed to be resectable by ENT and therefore referred for consideration of radiation or chemotherapy.  PET CT scan did not show anyEvidence of locoregional or distant metastatic disease.  Patient received definitive radiation to this area.  A few weeks after radiation was completed patient developed a rapidly growing mass anterior to his original site of disease involvement.   He had a repeat biopsy by ENT which showed squamous cell carcinoma with basaloid features and minimal focal keratinization.  Repeat PET CT scan showed3.4 x 2.2 cm ill-defined soft tissue mass within the soft tissues lateral to the mandible on the right side.  Hypermetabolic activity in the right lateral submandibular space at the level of the angle of mandible with an SUV of 4.5 suspicious for 1B nodal metastases.  Small hypermetabolic level 3 activity bilaterally with an SUV of 3.9 and 3.5 respectively concerning for possible disease involvement.  No evidence of distant metastatic disease.  Please note that I personally reviewed the images.  I have also personally reviewed the patient and seen the patient.  Initially he was very frustrated because he had to wait about 40 minutes.  I did talk to him I was very candid.  He was actually an add-on and I have been notified of the patient in less than 48 hours ago.  I have gone above and beyond to do the right thing for this patient.  After a discussion with the  patient he is willing to follow medical advice.  He notices significant pain on the right neck and right mandible and lip.  He continues have purulent and necrotic drainage from the right side of his mouth.  He has been losing weight and is currently malnourished.  He has been only on a liquid diet.  He has a prior history of radiation to his neck as well as chemotherapy. + weight loss     HPI       Past Medical History:  Diagnosis Date   Anxiety     Basal cell carcinoma 09/12/2018    Left forehead above lateral brow. Nodular pattern   Cancer ([facility])     Coronary artery disease 2003    a. nonobstructive CAD by cath in 2003 and 2005; b.  Status post three-vessel CABG on 12/12/2017 with LIMA to LAD, sequential reverse SVG to OM1 and distal LCx   COVID-19 03/2020   Diverticulosis     Hearing loss      bilateral   Heart disease     Heart murmur     History of benign prostatic hypertrophy     History of radiation therapy 01/31/10-03/22/10    right tonsil/right neck node 7000 cGy 35 sessions, high risk lymph node volume 5940 cGy 35 sessions, low risk lymph node vol 5600 cGy 35 sessions   HPV in male      positive   HTN (hypertension)     Hyperlipidemia      diet controlled- taking medication d/t  ensure drinking   Hypothyroid 01/16/2013   Ischemic cardiomyopathy      a. 10/2017: echo showing reduced EF of 40-45%, HK of the anterior, anteroseptal and apical myocardium with Grade 1 DD.    Neck pain     Persistent atrial fibrillation (HCC)     Primary squamous cell carcinoma of skin of lip 2023    lip and tonuge   Squamous cell carcinoma      right tonsil- 2011   Squamous cell carcinoma of skin 09/02/2019    Crown scalp. WD SCC   Squamous cell carcinoma of skin 09/02/2019    Left distal lateral deltoid. SCCis arising in SK   Thrombocytopenia ([place]) 02/02/2012    unclear etiology   Tinnitus     Tubular adenoma of colon             Past Surgical History:  Procedure Laterality Date    artoscopic knee       CARDIAC CATHETERIZATION   2003, 2005    40% blockage, two 30% blockages treated medically [facility])   CARDIOVERSION N/A 02/12/2018    Procedure: CARDIOVERSION;  Surgeon: [name], MD;  Location: [facility];  Service: Cardiovascular;  Laterality: N/A;   CARDIOVERSION N/A 05/10/2021    Procedure: CARDIOVERSION;  Surgeon: [name], MD;  Location: [facility];  Service: Cardiovascular;  Laterality: N/A;   COLONOSCOPY   03/2015    TA x3, HP, melanosis coli, severe diverticulosis, rpt 3 yrs ([facility])   COLONOSCOPY   01/2019    fair prep, diverticulosis, f/u PRN ([facility])   CORONARY ARTERY BYPASS GRAFT N/A 12/12/2017    Procedure: CORONARY ARTERY BYPASS GRAFTING (CABG) x3 using the right greater saphenous vein harvested endoscopically and the left internal mammary artery. LIMA to LAD, SEQ SVG to OM1 & OM2;  Surgeon: [name], MD;  Location: [place];  Service: Open Heart Surgery;  Laterality: N/A;   DENTAL SURGERY        extractions   IR THORACENTESIS ASP PLEURAL SPACE W/IMG GUIDE   01/17/2018   KNEE ARTHROSCOPY Left     LEFT HEART CATH AND CORONARY ANGIOGRAPHY N/A 12/10/2017    Procedure: LEFT HEART CATH AND CORONARY ANGIOGRAPHY;  Surgeon: [name], MD;  Location: [facility];  Service: Cardiovascular;  Laterality: N/A;   PEG PLACEMENT   02/2010   PORTA CATH INSERTION N/A 01/26/2022    Procedure: PORTA CATH INSERTION;  Surgeon: Katha Cabal, MD;  Location: [facility];  Service: Cardiovascular;  Laterality: N/A;   TEE WITHOUT CARDIOVERSION N/A 12/12/2017    Procedure: TRANSESOPHAGEAL ECHOCARDIOGRAM (TEE);  Surgeon: [name], MD;  Location: [place];  Service: Open Heart Surgery;  Laterality: N/A;   TONSILLECTOMY       triple bypass   11/2017   VASECTOMY               Family History  Problem Relation Age of Onset   Coronary artery disease Father     Heart attack Father 56   Heart disease Father     Stroke Mother     Colon  cancer Neg Hx     Esophageal cancer Neg Hx     Rectal cancer Neg Hx     Stomach cancer Neg Hx     Kidney cancer Neg Hx     Kidney failure Neg Hx     Prostate cancer Neg Hx     Tuberculosis Neg Hx  Social History Social History         Tobacco Use   Smoking status: Never   Smokeless tobacco: Never  Vaping Use   Vaping Use: Never used  Substance Use Topics   Alcohol use: Not Currently      Comment: glass of wine 1-2 times a month   Drug use: No           Allergies  Allergen Reactions   Ace Inhibitors Swelling and Other (See Comments)      Possible angioedema (upper lip swelling)   Morphine And Related Swelling      SWELLING REACTION UNSPECIFIED  [severity rated per PMH, 12/11/2017]   Motrin [Ibuprofen]              Current Outpatient Medications  Medication Sig Dispense Refill   amLODipine (NORVASC) 5 MG tablet Take 1 tablet by mouth once daily 30 tablet 0   apixaban (ELIQUIS) 5 MG TABS tablet Take 1 tablet (5 mg total) by mouth 2 (two) times daily. Please restart your Eliquis tomorrow morning 60 tablet 5   Benzocaine (HURRICAINE) 20 % AERO Use as directed 1 Application in the mouth or throat as needed. 57 g 3   Cholecalciferol (VITAMIN D3) 25 MCG (1000 UT) CAPS Take 1 capsule by mouth daily.       dexamethasone (DECADRON) 4 MG tablet Take 2 tablets (8 mg total) by mouth daily. Start the day after chemotherapy for 2 days. 30 tablet 1   docusate sodium (COLACE) 250 MG capsule Takes total '600mg'$  /day       fluconazole (DIFLUCAN) 200 MG tablet Take 1 tablet (200 mg total) by mouth daily. 7 tablet 0   fluocinonide gel (LIDEX) 0.05 %         FLUoxetine (PROZAC) 20 MG capsule Take 1 capsule (20 mg total) by mouth daily. 30 capsule 6   furosemide (LASIX) 20 MG tablet Take 1 tablet by mouth once daily 90 tablet 0   levothyroxine (EUTHYROX) 50 MCG tablet Take 1 tablet (50 mcg total) by mouth daily before breakfast. 30 tablet 8   lidocaine-prilocaine (EMLA) cream Apply to  affected area once 30 g 3   MAGNESIUM PO Take 1 tablet by mouth daily.       Multiple Vitamins-Minerals (ZINC PO) Take by mouth daily.       nitroGLYCERIN (NITROSTAT) 0.4 MG SL tablet Place 1 tablet (0.4 mg total) under the tongue every 5 (five) minutes as needed for chest pain (Maximum 3 doses.). 25 tablet 2   Omega-3 Fatty Acids (OMEGA 3 PO) Take by mouth daily.       ondansetron (ZOFRAN) 8 MG tablet Take 1 tablet (8 mg total) by mouth 2 (two) times daily as needed for refractory nausea / vomiting. Start on day 3 after chemo. 30 tablet 1   ondansetron (ZOFRAN-ODT) 4 MG disintegrating tablet Take 1 tablet (4 mg total) by mouth every 8 (eight) hours as needed for nausea or vomiting. 20 tablet 0   polyethylene glycol (MIRALAX / GLYCOLAX) 17 g packet Take 17 g by mouth daily as needed.       Potassium 99 MG TABS Take 1 tablet by mouth daily.       pregabalin (LYRICA) 75 MG capsule Take 1 capsule (75 mg total) by mouth 2 (two) times daily. 56 capsule 3   prochlorperazine (COMPAZINE) 10 MG tablet Take 1 tablet (10 mg total) by mouth every 6 (six) hours as needed (Nausea or vomiting). 30 tablet 1  rosuvastatin (CRESTOR) 20 MG tablet Take 1 tablet by mouth once daily 90 tablet 3   traMADol (ULTRAM) 50 MG tablet Take 1 tablet (50 mg total) by mouth every 6 (six) hours as needed. 30 tablet 0   VITAMIN A PO Take 2,400 mcg by mouth daily.        vitamin E 400 UNIT capsule Take 400 Units by mouth daily.       zaleplon (SONATA) 10 MG capsule Take 1 capsule (10 mg total) by mouth at bedtime as needed for sleep. 30 capsule 0   Zinc 50 MG CAPS Take 50 mg by mouth daily.        No current facility-administered medications for this visit.        Review of Systems Full ROS  was asked and was negative except for the information on the HPI   Physical Exam Blood pressure 116/80, pulse 72, temperature 98.7 F (37.1 C), temperature source Oral, height '6\' 2"'$  (1.88 m), weight 162 lb 3.2 oz (73.6 kg), SpO2 98  %. CONSTITUTIONAL: malnourished and debilitated . EYES: Pupils are equal, round, , Sclera are non-icteric. EARS, NOSE, MOUTH AND THROAT:  Large Right buccal necrotic mass, draining copious amount and necrotic and purulent fluid. limited mouth opening , less than 2 cms. Large submandibular right mass with induration, Residual radiation changes to his Neck . Unable to see oropharynx due to limited mouth opening.  The oropharynx is clear.Hearing is intact to voice. LYMPH NODES:  Lymph nodes in the neck are normal. RESPIRATORY:  Lungs are clear. There is normal respiratory effort, with equal breath sounds bilaterally, and without pathologic use of accessory muscles. CARDIOVASCULAR: Heart is regular without murmurs, gallops, or rubs. Prior sternotomy, good chest stability. GI: The abdomen is  soft, nontender, and nondistended. There are no palpable masses. There is no hepatosplenomegaly. There are normal bowel sounds in all quadrants. He does have a prior G tube scar Left upper quadrant. GU: Rectal deferred.   MUSCULOSKELETAL: Normal muscle strength and tone. No cyanosis or edema.   SKIN: Turgor is good and there are no pathologic skin lesions or ulcers. NEUROLOGIC: Motor and sensation is grossly normal. Cranial nerves are grossly intact. PSYCH:  Oriented to person, place and time. He exhibits anger and frustration   Data Reviewed   I have personally reviewed the patient's imaging, laboratory findings and medical records.     Assessment/Plan 81 year old with recurrent squamous cell carcinoma of the head and neck with nodal involvement and unable to undergo a radical resection.  Currently on chemotherapy anticipating some radiation.  I had an extensive discussion with the patient and his wife regarding his disease process.  My main concern is airway.  In order to perform a G-tube either open or laparoscopic or robotic I think that he will require a nasotracheal intubation guided by fiberoptic.  I do  not think that a PEG using the scope will be able to be feasible because of his limited mouth mobility.  Options are limited.  I do think that the best options for him will be to do a either robotic/laparoscopic or open gastrostomy tube in the operating room with a secure airway in nasal tracheal fashion.  I have had also an extensive discussion with Dr. Amie Critchley from anesthesia and they feel that a tertiary center such as [facility] is likely the best place for him as they do the scan of high risk airway on routine basis.  I had an extensive discussion with the wife  and she expresses significant concerns regarding his behavior and I have relayed this message to Dr. Janese Banks.  She will schedule neurology consultation.  He might not be a bad idea to perform an MRI of his brain to see if there is evidence of distant metastatic disease or any other organic problems that may explain his behavioral issues.  Please note that I spent greater than 90 minutes in this encounter to include coordination of his care, personally reviewing imaging studies, and performing appropriate documentation     Caroleen Hamman, MD FACS General Surgeon 03/01/2022, 6:25 PM

## 2022-03-02 ENCOUNTER — Telehealth: Payer: Self-pay | Admitting: Internal Medicine

## 2022-03-02 ENCOUNTER — Ambulatory Visit: Payer: PPO

## 2022-03-02 ENCOUNTER — Encounter: Payer: Self-pay | Admitting: Urgent Care

## 2022-03-02 ENCOUNTER — Other Ambulatory Visit: Payer: Self-pay

## 2022-03-02 ENCOUNTER — Ambulatory Visit
Admission: RE | Admit: 2022-03-02 | Discharge: 2022-03-02 | Disposition: A | Discharge status: Home / Self Care | Payer: PPO | Source: Ambulatory Visit | Attending: Radiation Oncology | Admitting: Radiation Oncology

## 2022-03-02 ENCOUNTER — Encounter: Payer: Self-pay | Admitting: Hospice and Palliative Medicine

## 2022-03-02 ENCOUNTER — Inpatient Hospital Stay (HOSPITAL_BASED_OUTPATIENT_CLINIC_OR_DEPARTMENT_OTHER): Payer: PPO | Admitting: Hospice and Palliative Medicine

## 2022-03-02 ENCOUNTER — Telehealth: Payer: Self-pay | Admitting: *Deleted

## 2022-03-02 VITALS — BP 122/77 | HR 63 | Temp 97.5°F | Resp 20 | Ht 74.0 in | Wt 162.2 lb

## 2022-03-02 DIAGNOSIS — Z5111 Encounter for antineoplastic chemotherapy: Secondary | ICD-10-CM | POA: Diagnosis not present

## 2022-03-02 DIAGNOSIS — Z515 Encounter for palliative care: Secondary | ICD-10-CM

## 2022-03-02 DIAGNOSIS — C069 Malignant neoplasm of mouth, unspecified: Secondary | ICD-10-CM | POA: Diagnosis not present

## 2022-03-02 DIAGNOSIS — C4402 Squamous cell carcinoma of skin of lip: Secondary | ICD-10-CM | POA: Diagnosis not present

## 2022-03-02 LAB — RAD ONC ARIA SESSION SUMMARY
Course Elapsed Days: 31
Plan Fractions Treated to Date: 13
Plan Prescribed Dose Per Fraction: 2 Gy
Plan Total Fractions Prescribed: 15
Plan Total Prescribed Dose: 30 Gy
Reference Point Dosage Given to Date: 46 Gy
Reference Point Session Dosage Given: 2 Gy
Session Number: 23

## 2022-03-02 NOTE — Progress Notes (Signed)
[place] at [facility] [phone] [phone]   Name: Phillip [name] Date: 03/02/2022 MRN: [id number]  DOB: 28-Jun-1941  Patient Care Team: [name], MD as PCP - General (Family Medicine) End, Harrell Gave, MD as PCP - Cardiology (Cardiology) Heath Lark, MD as Consulting Physician (Hematology and Oncology) Marijean Niemann, OD as Consulting Physician (Optometry) Sindy Guadeloupe, MD as Consulting Physician (Oncology) Noreene Filbert, MD as Consulting Physician (Radiation Oncology)    REASON FOR CONSULTATION: Phillip [name] is a 81 y.o. 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  Patient has had ongoing pain related to his malignancy.  Patient was referred to palliative care for ongoing symptom management.  SOCIAL HISTORY:     reports that he has never smoked. He has never used smokeless tobacco. He reports that he does not currently use alcohol. He reports that he does not use drugs.  Patient is married.  He was a long-distance runner and played college football.  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  He worked in business.  Patient has no biological children but does have stepchildren.  ADVANCE DIRECTIVES:  Not on file  CODE STATUS:   PAST MEDICAL HISTORY: Past Medical History:  Diagnosis Date   Anxiety    Basal cell carcinoma 09/12/2018   Left forehead above lateral brow. Nodular pattern   Cancer ([place])    Coronary artery disease 2003   a. nonobstructive CAD by cath in 2003 and 2005; b.  Status post three-vessel CABG on 12/12/2017 with LIMA to LAD, sequential reverse SVG to OM1 and distal LCx   COVID-19 03/2020   Diverticulosis    Hearing loss     bilateral   Heart disease    Heart murmur    History of benign prostatic hypertrophy    History of radiation therapy 01/31/10-03/22/10   right tonsil/right neck node 7000 cGy 35 sessions, high risk lymph node volume 5940 cGy 35 sessions, low risk lymph node vol 5600 cGy 35 sessions   HPV in male    positive   HTN (hypertension)    Hyperlipidemia    diet controlled- taking medication d/t ensure drinking   Hypothyroid 01/16/2013   Ischemic cardiomyopathy    a. 10/2017: echo showing reduced EF of 40-45%, HK of the anterior, anteroseptal and apical myocardium with Grade 1 DD.    Neck pain    Persistent atrial fibrillation (HCC)    Primary squamous cell carcinoma of skin of lip 2023   lip and tonuge   Squamous cell carcinoma    right tonsil- 2011   Squamous cell carcinoma of skin 09/02/2019   Crown scalp. WD SCC   Squamous cell carcinoma of skin 09/02/2019   Left distal lateral deltoid. SCCis arising in SK   Thrombocytopenia ([place]) 02/02/2012   unclear etiology   Tinnitus    Tubular adenoma of colon     PAST SURGICAL HISTORY:  Past Surgical History:  Procedure Laterality Date   artoscopic knee     CARDIAC CATHETERIZATION  2003, 2005   40% blockage, two 30% blockages treated medically Ron Parker)   CARDIOVERSION N/A 02/12/2018   Procedure: CARDIOVERSION;  Surgeon: [name], MD;  Location: [facility];  Service: Cardiovascular;  Laterality: N/A;   CARDIOVERSION N/A  05/10/2021   Procedure: CARDIOVERSION;  Surgeon: [name], MD;  Location: [facility];  Service: Cardiovascular;  Laterality: N/A;   COLONOSCOPY  03/2015   TA x3, HP, melanosis coli, severe diverticulosis, rpt 3 yrs ([place])   COLONOSCOPY  01/2019   fair prep, diverticulosis, f/u PRN ([place])   CORONARY ARTERY BYPASS GRAFT N/A 12/12/2017   Procedure: CORONARY ARTERY BYPASS GRAFTING (CABG) x3 using the right greater saphenous vein harvested endoscopically and the left internal mammary artery. LIMA to LAD, SEQ SVG to OM1  & OM2;  Surgeon: [name], MD;  Location: [place];  Service: Open Heart Surgery;  Laterality: N/A;   DENTAL SURGERY     extractions   IR THORACENTESIS ASP PLEURAL SPACE W/IMG GUIDE  01/17/2018   KNEE ARTHROSCOPY Left    LEFT HEART CATH AND CORONARY ANGIOGRAPHY N/A 12/10/2017   Procedure: LEFT HEART CATH AND CORONARY ANGIOGRAPHY;  Surgeon: [name], MD;  Location: [place] [facility];  Service: Cardiovascular;  Laterality: N/A;   PEG PLACEMENT  02/2010   PORTA CATH INSERTION N/A 01/26/2022   Procedure: PORTA CATH INSERTION;  Surgeon: Katha Cabal, MD;  Location: [place] [facility];  Service: Cardiovascular;  Laterality: N/A;   TEE WITHOUT CARDIOVERSION N/A 12/12/2017   Procedure: TRANSESOPHAGEAL ECHOCARDIOGRAM (TEE);  Surgeon: [name], MD;  Location: [place];  Service: Open Heart Surgery;  Laterality: N/A;   TONSILLECTOMY     triple bypass  11/2017   VASECTOMY      HEMATOLOGY/ONCOLOGY HISTORY:  Oncology History  Squamous cell carcinoma of buccal mucosa ([place])  11/09/2021 Initial Diagnosis   SCC (squamous cell carcinoma of buccal mucosa) ([place])   11/09/2021 Cancer Staging   Staging form: Oral Cavity, AJCC 8th Edition - Clinical stage from 11/09/2021: Stage I (cT1, cN0, cM0) - Signed by Sindy Guadeloupe, MD on 11/09/2021 Histologic grade (G): G1 Histologic grading system: 3 grade system   Squamous cell carcinoma of oral cavity ([place])  01/18/2022 Initial Diagnosis   Squamous cell carcinoma of oral cavity ([place])   01/18/2022 Cancer Staging   Staging form: Oral Cavity, AJCC 8th Edition - Clinical stage from 01/18/2022: Stage III (cT3, cN1, cM0) - Signed by Sindy Guadeloupe, MD on 01/18/2022   01/30/2022 -  Chemotherapy   Patient is on Treatment Plan : head and neck Carboplatin/PACLitaxel weekly x 6 weeks with XRT         ALLERGIES:  is allergic to ace inhibitors, morphine and related, and motrin [ibuprofen].  MEDICATIONS:  Current Outpatient Medications  Medication  Sig Dispense Refill   amLODipine (NORVASC) 5 MG tablet Take 1 tablet by mouth once daily 30 tablet 0   apixaban (ELIQUIS) 5 MG TABS tablet Take 1 tablet (5 mg total) by mouth 2 (two) times daily. Please restart your Eliquis tomorrow morning 60 tablet 5   Benzocaine (HURRICAINE) 20 % AERO Use as directed 1 Application in the mouth or throat as needed. 57 g 3   Cholecalciferol (VITAMIN D3) 25 MCG (1000 UT) CAPS Take 1 capsule by mouth daily.     dexamethasone (DECADRON) 4 MG tablet Take 2 tablets (8 mg total) by mouth daily. Start the day after chemotherapy for 2 days. 30 tablet 1   docusate sodium (COLACE) 250 MG capsule Takes total '600mg'$  /day     fluconazole (DIFLUCAN) 200 MG tablet Take 1 tablet (200 mg total) by mouth daily. 7 tablet 0   fluocinonide gel (LIDEX) 0.05 %      FLUoxetine (  PROZAC) 20 MG capsule Take 1 capsule (20 mg total) by mouth daily. 30 capsule 6   furosemide (LASIX) 20 MG tablet Take 1 tablet by mouth once daily 90 tablet 0   levothyroxine (EUTHYROX) 50 MCG tablet Take 1 tablet (50 mcg total) by mouth daily before breakfast. 30 tablet 8   lidocaine-prilocaine (EMLA) cream Apply to affected area once 30 g 3   MAGNESIUM PO Take 1 tablet by mouth daily.     Multiple Vitamins-Minerals (ZINC PO) Take by mouth daily.     nitroGLYCERIN (NITROSTAT) 0.4 MG SL tablet Place 1 tablet (0.4 mg total) under the tongue every 5 (five) minutes as needed for chest pain (Maximum 3 doses.). 25 tablet 2   Omega-3 Fatty Acids (OMEGA 3 PO) Take by mouth daily.     ondansetron (ZOFRAN) 8 MG tablet Take 1 tablet (8 mg total) by mouth 2 (two) times daily as needed for refractory nausea / vomiting. Start on day 3 after chemo. 30 tablet 1   ondansetron (ZOFRAN-ODT) 4 MG disintegrating tablet Take 1 tablet (4 mg total) by mouth every 8 (eight) hours as needed for nausea or vomiting. 20 tablet 0   polyethylene glycol (MIRALAX / GLYCOLAX) 17 g packet Take 17 g by mouth daily as needed.     Potassium 99 MG  TABS Take 1 tablet by mouth daily.     pregabalin (LYRICA) 75 MG capsule Take 1 capsule (75 mg total) by mouth 2 (two) times daily. 56 capsule 3   prochlorperazine (COMPAZINE) 10 MG tablet Take 1 tablet (10 mg total) by mouth every 6 (six) hours as needed (Nausea or vomiting). 30 tablet 1   rosuvastatin (CRESTOR) 20 MG tablet Take 1 tablet by mouth once daily 90 tablet 3   traMADol (ULTRAM) 50 MG tablet Take 1 tablet (50 mg total) by mouth every 6 (six) hours as needed. 30 tablet 0   VITAMIN A PO Take 2,400 mcg by mouth daily.      vitamin E 400 UNIT capsule Take 400 Units by mouth daily.     zaleplon (SONATA) 10 MG capsule Take 1 capsule (10 mg total) by mouth at bedtime as needed for sleep. 30 capsule 0   Zinc 50 MG CAPS Take 50 mg by mouth daily.     No current facility-administered medications for this visit.    VITAL SIGNS: There were no vitals taken for this visit. There were no vitals filed for this visit.  Estimated body mass index is 20.83 kg/m as calculated from the following:   Height as of 03/01/22: '6\' 2"'$  (1.88 m).   Weight as of 03/01/22: 162 lb 3.2 oz (73.6 kg).  LABS: CBC:    Component Value Date/Time   WBC 5.9 02/27/2022 0845   HGB 13.6 02/27/2022 0845   HGB 16.9 04/13/2021 1609   HGB 16.7 12/29/2013 1351   HCT 39.9 02/27/2022 0845   HCT 49.9 04/13/2021 1609   HCT 49.2 12/29/2013 1351   PLT 144 (L) 02/27/2022 0845   PLT 131 (L) 04/13/2021 1609   MCV 97.3 02/27/2022 0845   MCV 98 (H) 04/13/2021 1609   MCV 97.1 12/29/2013 1351   NEUTROABS 4.5 02/27/2022 0845   NEUTROABS 3.7 03/20/2018 0947   NEUTROABS 6.0 12/29/2013 1351   LYMPHSABS 0.9 02/27/2022 0845   LYMPHSABS 0.5 (L) 03/20/2018 0947   LYMPHSABS 0.4 (L) 12/29/2013 1351   MONOABS 0.4 02/27/2022 0845   MONOABS 0.5 12/29/2013 1351   EOSABS 0.0 02/27/2022 0845  EOSABS 0.1 03/20/2018 0947   BASOSABS 0.0 02/27/2022 0845   BASOSABS 0.0 03/20/2018 0947   BASOSABS 0.0 12/29/2013 1351   Comprehensive  Metabolic Panel:    Component Value Date/Time   NA 137 02/27/2022 0845   NA 142 05/13/2021 0955   NA 141 12/29/2013 1351   K 4.2 02/27/2022 0845   K 4.2 12/29/2013 1351   CL 102 02/27/2022 0845   CL 106 01/16/2013 0938   CO2 25 02/27/2022 0845   CO2 23 12/29/2013 1351   BUN 27 (H) 02/27/2022 0845   BUN 15 05/13/2021 0955   BUN 17.2 12/29/2013 1351   CREATININE 0.97 02/27/2022 0845   CREATININE 0.9 12/29/2013 1351   GLUCOSE 159 (H) 02/27/2022 0845   GLUCOSE 125 12/29/2013 1351   GLUCOSE 108 (H) 01/16/2013 0938   CALCIUM 8.8 (L) 02/27/2022 0845   CALCIUM 9.9 12/29/2013 1351   AST 36 02/27/2022 0845   AST 18 12/29/2013 1351   ALT 41 02/27/2022 0845   ALT 21 12/29/2013 1351   ALKPHOS 46 02/27/2022 0845   ALKPHOS 74 12/29/2013 1351   BILITOT 1.3 (H) 02/27/2022 0845   BILITOT 0.4 04/13/2021 1609   BILITOT 0.30 12/29/2013 1351   PROT 5.9 (L) 02/27/2022 0845   PROT 6.7 04/13/2021 1609   PROT 7.1 12/29/2013 1351   ALBUMIN 3.2 (L) 02/27/2022 0845   ALBUMIN 4.6 04/13/2021 1609   ALBUMIN 4.0 12/29/2013 1351    RADIOGRAPHIC STUDIES: No results found.  PERFORMANCE STATUS (ECOG) : 1 - Symptomatic but completely ambulatory  Review of Systems Unless otherwise noted, a complete review of systems is negative.  Physical Exam General: NAD HEENT: Swollen right lip Pulmonary: Unlabored Extremities: no edema, no joint deformities Skin: no rashes Neurological: Weakness but otherwise nonfocal  IMPRESSION: Patient was an add-on to my clinic schedule today to discuss artificial nutrition and PEG placement.  Patient has been seen by Dr. Dahlia Byes who has had extensive conversations with patient and wife about PEG placement.  Unfortunately, he is felt to be high risk for intubation and would require a fiberoptic airway only available at a tertiary center.  Of note, patient had a PEG years ago and appears to fully understand how that will impact his health.  Initially, patient said that he  did not want to pursue a PEG.  However, in further discussions he seemed more open to the idea.  Patient's wife was tearful and admitted to feeling overwhelmed regarding his care and feels like his quality of life is poor.  She implied that we should focus more on his comfort and not pursue further aggressive treatment.  She is asked about hospice, which we discussed in detail.  Patient did not seem interested in the idea of hospice.  Ultimately, patient and wife decided that they would take more time to think and pray about these decisions.  Wife says that they will let us they decide.  PLAN: -Continue current scope of treatment -Patient wife considering PEG and will let us know what they decide -RTC 2 weeks   Patient expressed understanding and was in agreement with this plan. He also understands that He can call the clinic at any time with any questions, concerns, or complaints.     Time Total: 30  minutes  Visit consisted of counseling and education dealing with the complex and emotionally intense issues of symptom management and palliative care in the setting of serious and potentially life-threatening illness.Greater than 50%  of this time was spent counseling  and coordinating care related to the above assessment and plan.  Signed by: Altha Harm, PhD, NP-C

## 2022-03-02 NOTE — Telephone Encounter (Signed)
Per Gregery Na, NP- scheduled patient to see Josh s/p radiation therapy apt. I contacted wife and informed wife of add on apt to evaluate feeding tube concerns. Wife gave verbal understanding of plan of care

## 2022-03-02 NOTE — Telephone Encounter (Signed)
   Pre-operative Risk Assessment    Patient Name: Phillip [name]  DOB: [birth year] MRN: [id number]{      Request for Surgical Clearance    Procedure:   PEG TUBE PLACEMENT  Date of Surgery:  Clearance 03/03/22                               Surgeon:  DR Caroleen Hamman Surgeon's Group or Practice Name:  [facility] Phone number:  [phone] Fax number:  [phone]  Type of Clearance Requested:   - Pharmacy:  Hold Apixaban (Eliquis)   {Not Indicated   Additional requests/questions:    [name]   03/02/2022, 7:50 AM

## 2022-03-02 NOTE — Telephone Encounter (Signed)
Received message from Honor Loh, NP:  I sent a message to the provider this morning, but I forgot that they asked for pharm clearance too. He is no longer being done here. He has a difficult airway and will require fiberoptic nasal intubation due to limited ability to open mouth. Last I heard, they may be avoiding procedure all together. Meeting with palliative care today to discuss.

## 2022-03-02 NOTE — Telephone Encounter (Signed)
Procedure date will need to be moved. Pt is pending DCCV on 7/18 and cannot stop his anticoagulation for 3 weeks prior and 4 weeks post cardioversion.

## 2022-03-03 ENCOUNTER — Telehealth: Payer: Self-pay

## 2022-03-03 ENCOUNTER — Ambulatory Visit
Admission: RE | Admit: 2022-03-03 | Discharge: 2022-03-03 | Disposition: A | Discharge status: Home / Self Care | Payer: PPO | Source: Ambulatory Visit | Attending: Radiation Oncology | Admitting: Radiation Oncology

## 2022-03-03 ENCOUNTER — Telehealth: Payer: Self-pay | Admitting: Hospice and Palliative Medicine

## 2022-03-03 ENCOUNTER — Telehealth: Payer: Self-pay | Admitting: Internal Medicine

## 2022-03-03 ENCOUNTER — Inpatient Hospital Stay: Payer: PPO

## 2022-03-03 ENCOUNTER — Ambulatory Visit: Payer: PPO

## 2022-03-03 ENCOUNTER — Other Ambulatory Visit: Payer: Self-pay

## 2022-03-03 DIAGNOSIS — C4402 Squamous cell carcinoma of skin of lip: Secondary | ICD-10-CM | POA: Diagnosis not present

## 2022-03-03 DIAGNOSIS — Z5111 Encounter for antineoplastic chemotherapy: Secondary | ICD-10-CM | POA: Diagnosis not present

## 2022-03-03 LAB — RAD ONC ARIA SESSION SUMMARY
Course Elapsed Days: 32
Plan Fractions Treated to Date: 14
Plan Prescribed Dose Per Fraction: 2 Gy
Plan Total Fractions Prescribed: 15
Plan Total Prescribed Dose: 30 Gy
Reference Point Dosage Given to Date: 48 Gy
Reference Point Session Dosage Given: 2 Gy
Session Number: 24

## 2022-03-03 NOTE — Telephone Encounter (Signed)
Patient's spouse is calling requesting a callback wanting to confirm Dr. Saunders Revel has reviewed the patient's chart with what is currently going on with him prior to his upcoming procedure.

## 2022-03-03 NOTE — Telephone Encounter (Deleted)
Called the pt wife to provide the Dr. Saunders Revel response and recommendation. Lmtcb.

## 2022-03-03 NOTE — Telephone Encounter (Signed)
End, Harrell Gave, MD  You; Solmon Ice, RN 49 minutes ago (3:37 PM)    I see that [name] is transitioning to comfort care/hospice. I think it would be reasonable for [place] to cancel his upcoming cardioversion. He can follow up with me as needed. I am happy to add him onto my schedule at any point if he wishes to see me.   Gerald Stabs

## 2022-03-03 NOTE — Telephone Encounter (Signed)
Called the pt wife to provide  Dr. Darnelle Bos response and recommendation. Lmtcb.

## 2022-03-03 NOTE — Telephone Encounter (Signed)
Nutrition  Received a message from staff that wife was trying to reach RD.    Called and left message for wife with RD callback number.   Brantley Naser B. Zenia Resides, [place], [place] Registered Dietitian [phone]

## 2022-03-03 NOTE — Progress Notes (Signed)
Nutrition Follow-up:  Patient with recurrent squamous cell carcinoma of right buccal mucosa.  Completed radiation and had rapidly enlarging mass anterior to original site to develop.  Patient receiving radiation and chemotherapy.    Met with patient and wife following radiation.  Patient verbalizes no that he does not want the PEG tube at this time and shakes his head no. Drinking maybe 3 equate plus shakes per day (~1050 calories total/d). Patient is limited with opening mouth and liquids run out of his mouth per wife.  "I have him covered in towels because the liquids run out of his mouth."    Wife tearful during visit.   General surgery note reviewed regarding feeding tube placement and recommendation to be done a tertiary care center.   Medications: reviewed  Labs: reviewed  Anthropometrics:   Weight 162 lb 3.2 oz on 7/13 175 lb 1.6 oz on 6/26 170 lb on 6/12 182 lb per [facility] on 4/25 186 lb 7 oz on 4/4 per [facility] 193 lb on 09/02/21  7% weight loss in the last 18 days, significant weight loss  Estimated Energy Needs   Kcals: 2300-2695 Protein: 115-134 g Fluid: 2300-2695 ml     NUTRITION DIAGNOSIS: Inadequate oral intake continues   MALNUTRITION DIAGNOSIS: Patient meets criteria for severe malnutrition in acute illness, likely progressing to chronic as evidenced by 7% weight loss in the last 18 days and eating less than 50% of estimated energy needs (consuming ~1050 calories) for greater than 5 days.    INTERVENTION:  Patient declines feeding tube placement at this time. Recommend Boost VHC shake (522 calories/bottle) as higher in calories than equate plus. RD has given patient samples in the past. Wife says that she can't order it online. RD walked wife through steps of how to order shake on [employer].  Additional samples of boost [facility] given to patient today.      MONITORING, EVALUATION, GOAL: weight trend, intake   NEXT VISIT: phone call Friday, July 28  Elwyn Lowden B. Zenia Resides, [place],  [place] Registered Dietitian [phone]

## 2022-03-03 NOTE — Telephone Encounter (Signed)
I spoke with patient and wife by phone.  Patient says that he has decided not to pursue PEG.  He says that he would like to complete his final XRT/chemo treatment on Monday and then transition to focus on comfort at home with hospice involvement.  Wife says that she has already reached out to hospice.  Dr. Saunders Revel - FYI. Wife asked about canceling scheduled cardioversion.

## 2022-03-04 ENCOUNTER — Other Ambulatory Visit: Payer: Self-pay

## 2022-03-04 ENCOUNTER — Inpatient Hospital Stay
Admission: EM | Admit: 2022-03-04 | Discharge: 2022-03-06 | DRG: 809 | Disposition: A | Discharge status: Hospice | Payer: PPO | Attending: Internal Medicine | Admitting: Internal Medicine

## 2022-03-04 ENCOUNTER — Encounter: Payer: Self-pay | Admitting: Radiology

## 2022-03-04 ENCOUNTER — Emergency Department: Payer: PPO

## 2022-03-04 DIAGNOSIS — Z8616 Personal history of COVID-19: Secondary | ICD-10-CM

## 2022-03-04 DIAGNOSIS — I4819 Other persistent atrial fibrillation: Secondary | ICD-10-CM | POA: Diagnosis not present

## 2022-03-04 DIAGNOSIS — D61810 Antineoplastic chemotherapy induced pancytopenia: Principal | ICD-10-CM | POA: Diagnosis present

## 2022-03-04 DIAGNOSIS — Z79899 Other long term (current) drug therapy: Secondary | ICD-10-CM | POA: Diagnosis not present

## 2022-03-04 DIAGNOSIS — Z85828 Personal history of other malignant neoplasm of skin: Secondary | ICD-10-CM | POA: Diagnosis not present

## 2022-03-04 DIAGNOSIS — Z951 Presence of aortocoronary bypass graft: Secondary | ICD-10-CM

## 2022-03-04 DIAGNOSIS — R531 Weakness: Secondary | ICD-10-CM | POA: Diagnosis present

## 2022-03-04 DIAGNOSIS — Z7901 Long term (current) use of anticoagulants: Secondary | ICD-10-CM | POA: Diagnosis not present

## 2022-03-04 DIAGNOSIS — C069 Malignant neoplasm of mouth, unspecified: Secondary | ICD-10-CM | POA: Diagnosis present

## 2022-03-04 DIAGNOSIS — T451X5A Adverse effect of antineoplastic and immunosuppressive drugs, initial encounter: Secondary | ICD-10-CM | POA: Diagnosis present

## 2022-03-04 DIAGNOSIS — G629 Polyneuropathy, unspecified: Secondary | ICD-10-CM | POA: Diagnosis present

## 2022-03-04 DIAGNOSIS — E860 Dehydration: Secondary | ICD-10-CM | POA: Diagnosis present

## 2022-03-04 DIAGNOSIS — W19XXXA Unspecified fall, initial encounter: Principal | ICD-10-CM

## 2022-03-04 DIAGNOSIS — Z515 Encounter for palliative care: Secondary | ICD-10-CM

## 2022-03-04 DIAGNOSIS — R627 Adult failure to thrive: Secondary | ICD-10-CM | POA: Diagnosis present

## 2022-03-04 DIAGNOSIS — I482 Chronic atrial fibrillation, unspecified: Secondary | ICD-10-CM

## 2022-03-04 DIAGNOSIS — J22 Unspecified acute lower respiratory infection: Secondary | ICD-10-CM | POA: Diagnosis not present

## 2022-03-04 DIAGNOSIS — E876 Hypokalemia: Secondary | ICD-10-CM | POA: Diagnosis not present

## 2022-03-04 DIAGNOSIS — S0990XA Unspecified injury of head, initial encounter: Secondary | ICD-10-CM | POA: Diagnosis not present

## 2022-03-04 DIAGNOSIS — I447 Left bundle-branch block, unspecified: Secondary | ICD-10-CM | POA: Diagnosis present

## 2022-03-04 DIAGNOSIS — I959 Hypotension, unspecified: Secondary | ICD-10-CM | POA: Diagnosis not present

## 2022-03-04 DIAGNOSIS — I2510 Atherosclerotic heart disease of native coronary artery without angina pectoris: Secondary | ICD-10-CM | POA: Diagnosis not present

## 2022-03-04 DIAGNOSIS — N400 Enlarged prostate without lower urinary tract symptoms: Secondary | ICD-10-CM | POA: Diagnosis present

## 2022-03-04 DIAGNOSIS — Z823 Family history of stroke: Secondary | ICD-10-CM

## 2022-03-04 DIAGNOSIS — Z7401 Bed confinement status: Secondary | ICD-10-CM | POA: Diagnosis not present

## 2022-03-04 DIAGNOSIS — I10 Essential (primary) hypertension: Secondary | ICD-10-CM | POA: Diagnosis present

## 2022-03-04 DIAGNOSIS — R296 Repeated falls: Secondary | ICD-10-CM | POA: Diagnosis not present

## 2022-03-04 DIAGNOSIS — E039 Hypothyroidism, unspecified: Secondary | ICD-10-CM | POA: Diagnosis present

## 2022-03-04 DIAGNOSIS — Z66 Do not resuscitate: Secondary | ICD-10-CM | POA: Diagnosis not present

## 2022-03-04 DIAGNOSIS — R5381 Other malaise: Secondary | ICD-10-CM | POA: Diagnosis not present

## 2022-03-04 DIAGNOSIS — E785 Hyperlipidemia, unspecified: Secondary | ICD-10-CM | POA: Diagnosis not present

## 2022-03-04 DIAGNOSIS — D61818 Other pancytopenia: Secondary | ICD-10-CM | POA: Diagnosis not present

## 2022-03-04 DIAGNOSIS — Z923 Personal history of irradiation: Secondary | ICD-10-CM

## 2022-03-04 DIAGNOSIS — F32A Depression, unspecified: Secondary | ICD-10-CM | POA: Diagnosis not present

## 2022-03-04 DIAGNOSIS — R29898 Other symptoms and signs involving the musculoskeletal system: Secondary | ICD-10-CM | POA: Diagnosis not present

## 2022-03-04 DIAGNOSIS — R58 Hemorrhage, not elsewhere classified: Secondary | ICD-10-CM | POA: Diagnosis not present

## 2022-03-04 DIAGNOSIS — I255 Ischemic cardiomyopathy: Secondary | ICD-10-CM | POA: Diagnosis present

## 2022-03-04 DIAGNOSIS — D689 Coagulation defect, unspecified: Secondary | ICD-10-CM | POA: Diagnosis not present

## 2022-03-04 DIAGNOSIS — A419 Sepsis, unspecified organism: Secondary | ICD-10-CM | POA: Diagnosis not present

## 2022-03-04 DIAGNOSIS — Z8249 Family history of ischemic heart disease and other diseases of the circulatory system: Secondary | ICD-10-CM

## 2022-03-04 DIAGNOSIS — I4891 Unspecified atrial fibrillation: Secondary | ICD-10-CM | POA: Diagnosis not present

## 2022-03-04 DIAGNOSIS — Z79890 Hormone replacement therapy (postmenopausal): Secondary | ICD-10-CM

## 2022-03-04 MED ORDER — DILTIAZEM HCL 25 MG/5ML IV SOLN
10.0000 mg | Freq: Once | INTRAVENOUS | Status: AC
  Administered 2022-03-04: 10 mg via INTRAVENOUS
  Filled 2022-03-04: qty 5

## 2022-03-04 MED ORDER — LACTATED RINGERS IV BOLUS (SEPSIS)
1000.0000 mL | Freq: Once | INTRAVENOUS | Status: AC
  Administered 2022-03-04: 1000 mL via INTRAVENOUS

## 2022-03-04 NOTE — ED Notes (Signed)
Wife in room at this time.

## 2022-03-04 NOTE — ED Triage Notes (Signed)
Pt arrives with frequent falls today. Pt with mouth cancer and is HOH. Pt arrives in a fib rvr rate 130-150. Pt denies new pain or loc.

## 2022-03-05 DIAGNOSIS — I4819 Other persistent atrial fibrillation: Secondary | ICD-10-CM | POA: Diagnosis present

## 2022-03-05 DIAGNOSIS — Z66 Do not resuscitate: Secondary | ICD-10-CM | POA: Diagnosis present

## 2022-03-05 DIAGNOSIS — I10 Essential (primary) hypertension: Secondary | ICD-10-CM | POA: Diagnosis present

## 2022-03-05 DIAGNOSIS — Z7901 Long term (current) use of anticoagulants: Secondary | ICD-10-CM | POA: Diagnosis not present

## 2022-03-05 DIAGNOSIS — Z8616 Personal history of COVID-19: Secondary | ICD-10-CM | POA: Diagnosis not present

## 2022-03-05 DIAGNOSIS — Z923 Personal history of irradiation: Secondary | ICD-10-CM | POA: Diagnosis not present

## 2022-03-05 DIAGNOSIS — F32A Depression, unspecified: Secondary | ICD-10-CM

## 2022-03-05 DIAGNOSIS — E785 Hyperlipidemia, unspecified: Secondary | ICD-10-CM | POA: Diagnosis present

## 2022-03-05 DIAGNOSIS — R296 Repeated falls: Secondary | ICD-10-CM | POA: Diagnosis present

## 2022-03-05 DIAGNOSIS — Z515 Encounter for palliative care: Secondary | ICD-10-CM | POA: Diagnosis not present

## 2022-03-05 DIAGNOSIS — Z79899 Other long term (current) drug therapy: Secondary | ICD-10-CM | POA: Diagnosis not present

## 2022-03-05 DIAGNOSIS — E876 Hypokalemia: Secondary | ICD-10-CM | POA: Diagnosis present

## 2022-03-05 DIAGNOSIS — R531 Weakness: Secondary | ICD-10-CM | POA: Diagnosis present

## 2022-03-05 DIAGNOSIS — E039 Hypothyroidism, unspecified: Secondary | ICD-10-CM

## 2022-03-05 DIAGNOSIS — C069 Malignant neoplasm of mouth, unspecified: Secondary | ICD-10-CM | POA: Diagnosis present

## 2022-03-05 DIAGNOSIS — Z85828 Personal history of other malignant neoplasm of skin: Secondary | ICD-10-CM | POA: Diagnosis not present

## 2022-03-05 DIAGNOSIS — T451X5A Adverse effect of antineoplastic and immunosuppressive drugs, initial encounter: Secondary | ICD-10-CM | POA: Diagnosis present

## 2022-03-05 DIAGNOSIS — G629 Polyneuropathy, unspecified: Secondary | ICD-10-CM | POA: Diagnosis present

## 2022-03-05 DIAGNOSIS — E860 Dehydration: Secondary | ICD-10-CM | POA: Diagnosis present

## 2022-03-05 DIAGNOSIS — D61818 Other pancytopenia: Secondary | ICD-10-CM | POA: Diagnosis present

## 2022-03-05 DIAGNOSIS — J22 Unspecified acute lower respiratory infection: Secondary | ICD-10-CM

## 2022-03-05 DIAGNOSIS — I2510 Atherosclerotic heart disease of native coronary artery without angina pectoris: Secondary | ICD-10-CM | POA: Diagnosis present

## 2022-03-05 DIAGNOSIS — I255 Ischemic cardiomyopathy: Secondary | ICD-10-CM | POA: Diagnosis present

## 2022-03-05 DIAGNOSIS — I482 Chronic atrial fibrillation, unspecified: Secondary | ICD-10-CM

## 2022-03-05 DIAGNOSIS — D689 Coagulation defect, unspecified: Secondary | ICD-10-CM

## 2022-03-05 DIAGNOSIS — D61810 Antineoplastic chemotherapy induced pancytopenia: Secondary | ICD-10-CM | POA: Diagnosis present

## 2022-03-05 DIAGNOSIS — R627 Adult failure to thrive: Secondary | ICD-10-CM | POA: Diagnosis present

## 2022-03-05 LAB — COMPREHENSIVE METABOLIC PANEL
ALT: 28 U/L (ref 0–44)
AST: 22 U/L (ref 15–41)
Albumin: 2.8 g/dL — ABNORMAL LOW (ref 3.5–5.0)
Alkaline Phosphatase: 38 U/L (ref 38–126)
Anion gap: 5 (ref 5–15)
BUN: 24 mg/dL — ABNORMAL HIGH (ref 8–23)
CO2: 26 mmol/L (ref 22–32)
Calcium: 8.4 mg/dL — ABNORMAL LOW (ref 8.9–10.3)
Chloride: 109 mmol/L (ref 98–111)
Creatinine, Ser: 0.83 mg/dL (ref 0.61–1.24)
GFR, Estimated: >60 mL/min (ref >60)
Glucose, Bld: 139 mg/dL — ABNORMAL HIGH (ref 70–99)
Potassium: 3.6 mmol/L (ref 3.5–5.1)
Sodium: 140 mmol/L (ref 135–145)
Total Bilirubin: 0.9 mg/dL (ref 0.3–1.2)
Total Protein: 5.3 g/dL — ABNORMAL LOW (ref 6.5–8.1)

## 2022-03-05 LAB — URINALYSIS, COMPLETE (UACMP) WITH MICROSCOPIC
Bacteria, UA: NONE SEEN
Bilirubin Urine: NEGATIVE
Glucose, UA: NEGATIVE mg/dL
Hgb urine dipstick: NEGATIVE
Ketones, ur: 5 mg/dL — AB
Leukocytes,Ua: NEGATIVE
Nitrite: NEGATIVE
Protein, ur: 30 mg/dL — AB
Specific Gravity, Urine: 1.019 (ref 1.005–1.030)
pH: 6 (ref 5.0–8.0)

## 2022-03-05 LAB — CBC
HCT: 34.6 % — ABNORMAL LOW (ref 39.0–52.0)
Hemoglobin: 11.5 g/dL — ABNORMAL LOW (ref 13.0–17.0)
MCH: 32.9 pg (ref 26.0–34.0)
MCHC: 33.2 g/dL (ref 30.0–36.0)
MCV: 98.9 fL (ref 80.0–100.0)
Platelets: 85 10*3/uL — ABNORMAL LOW (ref 150–400)
RBC: 3.5 MIL/uL — ABNORMAL LOW (ref 4.22–5.81)
RDW: 13.2 % (ref 11.5–15.5)
WBC: 1.5 10*3/uL — ABNORMAL LOW (ref 4.0–10.5)
nRBC: 0 % (ref 0.0–0.2)

## 2022-03-05 LAB — CBC WITH DIFFERENTIAL/PLATELET
Abs Immature Granulocytes: 0 10*3/uL (ref 0.00–0.07)
Basophils Absolute: 0 10*3/uL (ref 0.0–0.1)
Basophils Relative: 1 %
Eosinophils Absolute: 0 10*3/uL (ref 0.0–0.5)
Eosinophils Relative: 0 %
HCT: 36.9 % — ABNORMAL LOW (ref 39.0–52.0)
Hemoglobin: 12.1 g/dL — ABNORMAL LOW (ref 13.0–17.0)
Immature Granulocytes: 0 %
Lymphocytes Relative: 38 %
Lymphs Abs: 0.4 10*3/uL — ABNORMAL LOW (ref 0.7–4.0)
MCH: 32.4 pg (ref 26.0–34.0)
MCHC: 32.8 g/dL (ref 30.0–36.0)
MCV: 98.7 fL (ref 80.0–100.0)
Monocytes Absolute: 0.1 10*3/uL (ref 0.1–1.0)
Monocytes Relative: 8 %
Neutro Abs: 0.5 10*3/uL — ABNORMAL LOW (ref 1.7–7.7)
Neutrophils Relative %: 53 %
Platelets: 121 10*3/uL — ABNORMAL LOW (ref 150–400)
RBC: 3.74 MIL/uL — ABNORMAL LOW (ref 4.22–5.81)
RDW: 13.1 % (ref 11.5–15.5)
WBC: 1 10*3/uL — CL (ref 4.0–10.5)
nRBC: 0 % (ref 0.0–0.2)

## 2022-03-05 LAB — BASIC METABOLIC PANEL
Anion gap: 8 (ref 5–15)
BUN: 19 mg/dL (ref 8–23)
CO2: 21 mmol/L — ABNORMAL LOW (ref 22–32)
Calcium: 7 mg/dL — ABNORMAL LOW (ref 8.9–10.3)
Chloride: 106 mmol/L (ref 98–111)
Creatinine, Ser: 0.58 mg/dL — ABNORMAL LOW (ref 0.61–1.24)
GFR, Estimated: >60 mL/min (ref >60)
Glucose, Bld: 107 mg/dL — ABNORMAL HIGH (ref 70–99)
Potassium: 3.3 mmol/L — ABNORMAL LOW (ref 3.5–5.1)
Sodium: 135 mmol/L (ref 135–145)

## 2022-03-05 LAB — PROTIME-INR
INR: 2 — ABNORMAL HIGH (ref 0.8–1.2)
INR: 2.5 — ABNORMAL HIGH (ref 0.8–1.2)
Prothrombin Time: 22.9 seconds — ABNORMAL HIGH (ref 11.4–15.2)
Prothrombin Time: 26.4 seconds — ABNORMAL HIGH (ref 11.4–15.2)

## 2022-03-05 LAB — APTT
aPTT: 192 seconds (ref 24–36)
aPTT: 29 seconds (ref 24–36)

## 2022-03-05 LAB — PROCALCITONIN: Procalcitonin: <0.1 ng/mL

## 2022-03-05 LAB — TROPONIN I (HIGH SENSITIVITY)
Troponin I (High Sensitivity): 21 ng/L — ABNORMAL HIGH (ref <18)
Troponin I (High Sensitivity): 21 ng/L — ABNORMAL HIGH (ref <18)

## 2022-03-05 LAB — SARS CORONAVIRUS 2 BY RT PCR: SARS Coronavirus 2 by RT PCR: NEGATIVE

## 2022-03-05 LAB — LACTIC ACID, PLASMA
Lactic Acid, Venous: 1.1 mmol/L (ref 0.5–1.9)
Lactic Acid, Venous: 1.3 mmol/L (ref 0.5–1.9)
Lactic Acid, Venous: 2.3 mmol/L (ref 0.5–1.9)

## 2022-03-05 LAB — BRAIN NATRIURETIC PEPTIDE: B Natriuretic Peptide: 148 pg/mL — ABNORMAL HIGH (ref 0.0–100.0)

## 2022-03-05 MED ORDER — GUAIFENESIN ER 600 MG PO TB12
600.0000 mg | ORAL_TABLET | Freq: Two times a day (BID) | ORAL | Status: DC
  Filled 2022-03-05: qty 1

## 2022-03-05 MED ORDER — CALCIUM GLUCONATE-NACL 1-0.675 GM/50ML-% IV SOLN
1.0000 g | Freq: Once | INTRAVENOUS | Status: AC
  Administered 2022-03-05: 1000 mg via INTRAVENOUS
  Filled 2022-03-05: qty 50

## 2022-03-05 MED ORDER — LEVOTHYROXINE SODIUM 50 MCG PO TABS
50.0000 ug | ORAL_TABLET | Freq: Every day | ORAL | Status: DC
  Administered 2022-03-05: 50 ug via ORAL
  Filled 2022-03-05: qty 1

## 2022-03-05 MED ORDER — MAGNESIUM OXIDE -MG SUPPLEMENT 400 (240 MG) MG PO TABS
400.0000 mg | ORAL_TABLET | Freq: Every day | ORAL | Status: DC
  Filled 2022-03-05: qty 1

## 2022-03-05 MED ORDER — ACETAMINOPHEN 650 MG RE SUPP: 650.0000 mg | Freq: Four times a day (QID) | RECTAL | Status: DC | PRN

## 2022-03-05 MED ORDER — VITAMIN D 25 MCG (1000 UNIT) PO TABS
1000.0000 [IU] | ORAL_TABLET | Freq: Every day | ORAL | Status: DC
  Filled 2022-03-05: qty 1

## 2022-03-05 MED ORDER — FLUOCINONIDE 0.05 % EX GEL
Freq: Two times a day (BID) | CUTANEOUS | Status: DC
Start: 2022-03-05 — End: 2022-03-05

## 2022-03-05 MED ORDER — BENZOCAINE 20 % MT PSTE
1.0000 | PASTE | OROMUCOSAL | Status: DC | PRN
Start: 2022-03-05 — End: 2022-03-06

## 2022-03-05 MED ORDER — SODIUM CHLORIDE 0.9 % IV SOLN: 2.0000 g | Freq: Once | INTRAVENOUS | Status: DC

## 2022-03-05 MED ORDER — TRAZODONE HCL 50 MG PO TABS: 25.0000 mg | ORAL_TABLET | Freq: Every evening | ORAL | Status: DC | PRN

## 2022-03-05 MED ORDER — DOCUSATE SODIUM 100 MG PO CAPS
200.0000 mg | ORAL_CAPSULE | Freq: Three times a day (TID) | ORAL | Status: DC
  Filled 2022-03-05: qty 2

## 2022-03-05 MED ORDER — ZOLPIDEM TARTRATE 5 MG PO TABS: 5.0000 mg | ORAL_TABLET | Freq: Every evening | ORAL | Status: DC | PRN

## 2022-03-05 MED ORDER — TRAMADOL HCL 50 MG PO TABS: 50.0000 mg | ORAL_TABLET | Freq: Four times a day (QID) | ORAL | Status: DC | PRN

## 2022-03-05 MED ORDER — LIDOCAINE-PRILOCAINE 2.5-2.5 % EX CREA
TOPICAL_CREAM | Freq: Once | CUTANEOUS | Status: AC
Start: 2022-03-05 — End: 2022-03-05
  Filled 2022-03-05: qty 5

## 2022-03-05 MED ORDER — SODIUM CHLORIDE 0.9 % IV SOLN: INTRAVENOUS | Status: DC | PRN

## 2022-03-05 MED ORDER — GLYCOPYRROLATE 0.2 MG/ML IJ SOLN
0.1000 mg | Freq: Once | INTRAMUSCULAR | Status: AC
  Administered 2022-03-05: 0.1 mg via INTRAVENOUS
  Filled 2022-03-05: qty 0.5

## 2022-03-05 MED ORDER — PROCHLORPERAZINE MALEATE 10 MG PO TABS: 10.0000 mg | ORAL_TABLET | Freq: Four times a day (QID) | ORAL | Status: DC | PRN

## 2022-03-05 MED ORDER — METRONIDAZOLE 500 MG/100ML IV SOLN
500.0000 mg | Freq: Once | INTRAVENOUS | Status: AC
  Administered 2022-03-05: 500 mg via INTRAVENOUS
  Filled 2022-03-05: qty 100

## 2022-03-05 MED ORDER — SODIUM CHLORIDE 0.9 % IV SOLN
2.0000 g | Freq: Three times a day (TID) | INTRAVENOUS | Status: DC
  Administered 2022-03-05 – 2022-03-06 (×3): 2 g via INTRAVENOUS
  Filled 2022-03-05 (×2): qty 12.5
  Filled 2022-03-05 (×2): qty 2
  Filled 2022-03-05: qty 12.5

## 2022-03-05 MED ORDER — SODIUM CHLORIDE 0.9 % IV BOLUS (SEPSIS)
1000.0000 mL | Freq: Once | INTRAVENOUS | Status: AC
Start: 2022-03-05 — End: 2022-03-05
  Administered 2022-03-05: 1000 mL via INTRAVENOUS

## 2022-03-05 MED ORDER — VANCOMYCIN HCL 1500 MG/300ML IV SOLN
1500.0000 mg | INTRAVENOUS | Status: DC
  Administered 2022-03-06: 1500 mg via INTRAVENOUS
  Filled 2022-03-05: qty 300

## 2022-03-05 MED ORDER — VANCOMYCIN HCL IN DEXTROSE 1-5 GM/200ML-% IV SOLN: 1000.0000 mg | Freq: Once | INTRAVENOUS | Status: DC

## 2022-03-05 MED ORDER — SODIUM CHLORIDE 0.9 % IV BOLUS (SEPSIS)
1000.0000 mL | Freq: Once | INTRAVENOUS | Status: AC
  Administered 2022-03-05: 1000 mL via INTRAVENOUS

## 2022-03-05 MED ORDER — CHLORHEXIDINE GLUCONATE CLOTH 2 % EX PADS
6.0000 | MEDICATED_PAD | Freq: Every day | CUTANEOUS | Status: DC
  Administered 2022-03-05: 6 via TOPICAL

## 2022-03-05 MED ORDER — ORAL CARE MOUTH RINSE: 15.0000 mL | OROMUCOSAL | Status: DC

## 2022-03-05 MED ORDER — ONDANSETRON 4 MG PO TBDP: 4.0000 mg | ORAL_TABLET | Freq: Three times a day (TID) | ORAL | Status: DC | PRN

## 2022-03-05 MED ORDER — POTASSIUM CHLORIDE 10 MEQ/100ML IV SOLN
10.0000 meq | INTRAVENOUS | Status: AC
  Administered 2022-03-05 (×4): 10 meq via INTRAVENOUS
  Filled 2022-03-05 (×4): qty 100

## 2022-03-05 MED ORDER — ACETAMINOPHEN 325 MG PO TABS: 650.0000 mg | ORAL_TABLET | Freq: Four times a day (QID) | ORAL | Status: DC | PRN

## 2022-03-05 MED ORDER — HYDROMORPHONE HCL 1 MG/ML IJ SOLN
1.0000 mg | INTRAMUSCULAR | Status: DC | PRN
  Administered 2022-03-05 (×2): 1 mg via INTRAVENOUS
  Filled 2022-03-05 (×3): qty 1

## 2022-03-05 MED ORDER — SODIUM CHLORIDE 0.9 % IV SOLN: INTRAVENOUS | Status: DC

## 2022-03-05 MED ORDER — SODIUM CHLORIDE 0.9 % IV SOLN
2.0000 g | Freq: Once | INTRAVENOUS | Status: AC
  Administered 2022-03-05: 2 g via INTRAVENOUS
  Filled 2022-03-05: qty 12.5

## 2022-03-05 MED ORDER — ZINC SULFATE 220 (50 ZN) MG PO CAPS
220.0000 mg | ORAL_CAPSULE | Freq: Every day | ORAL | Status: DC
  Filled 2022-03-05: qty 1

## 2022-03-05 MED ORDER — IPRATROPIUM-ALBUTEROL 0.5-2.5 (3) MG/3ML IN SOLN
3.0000 mL | Freq: Four times a day (QID) | RESPIRATORY_TRACT | Status: DC
  Administered 2022-03-05 – 2022-03-06 (×4): 3 mL via RESPIRATORY_TRACT
  Filled 2022-03-05 (×5): qty 3

## 2022-03-05 MED ORDER — ENOXAPARIN SODIUM 40 MG/0.4ML IJ SOSY
40.0000 mg | PREFILLED_SYRINGE | INTRAMUSCULAR | Status: DC
  Administered 2022-03-05: 40 mg via SUBCUTANEOUS
  Filled 2022-03-05: qty 0.4

## 2022-03-05 MED ORDER — POTASSIUM 99 MG PO TABS: 1.0000 | ORAL_TABLET | Freq: Every day | ORAL | Status: DC

## 2022-03-05 MED ORDER — VITAMIN E 45 MG (100 UNIT) PO CAPS
400.0000 [IU] | ORAL_CAPSULE | Freq: Every day | ORAL | Status: DC
Start: 2022-03-05 — End: 2022-03-06
  Filled 2022-03-05 (×2): qty 4

## 2022-03-05 MED ORDER — METOPROLOL TARTRATE 5 MG/5ML IV SOLN: 5.0000 mg | Freq: Four times a day (QID) | INTRAVENOUS | Status: DC | PRN

## 2022-03-05 MED ORDER — OMEGA-3-ACID ETHYL ESTERS 1 G PO CAPS
ORAL_CAPSULE | Freq: Every day | ORAL | Status: DC
  Filled 2022-03-05: qty 1

## 2022-03-05 MED ORDER — NITROGLYCERIN 0.4 MG SL SUBL: 0.4000 mg | SUBLINGUAL_TABLET | SUBLINGUAL | Status: DC | PRN

## 2022-03-05 MED ORDER — POLYETHYLENE GLYCOL 3350 17 G PO PACK: 17.0000 g | PACK | Freq: Every day | ORAL | Status: DC | PRN

## 2022-03-05 MED ORDER — METRONIDAZOLE 500 MG/100ML IV SOLN
500.0000 mg | Freq: Two times a day (BID) | INTRAVENOUS | Status: DC
  Administered 2022-03-05 (×2): 500 mg via INTRAVENOUS
  Filled 2022-03-05 (×3): qty 100

## 2022-03-05 MED ORDER — ROSUVASTATIN CALCIUM 10 MG PO TABS
20.0000 mg | ORAL_TABLET | Freq: Every day | ORAL | Status: DC
  Administered 2022-03-05: 20 mg via ORAL
  Filled 2022-03-05: qty 1

## 2022-03-05 MED ORDER — VANCOMYCIN HCL 1500 MG/300ML IV SOLN
1500.0000 mg | Freq: Once | INTRAVENOUS | Status: AC
  Administered 2022-03-05: 1500 mg via INTRAVENOUS
  Filled 2022-03-05: qty 300

## 2022-03-05 MED ORDER — POLYETHYLENE GLYCOL 3350 17 G PO PACK
17.0000 g | PACK | Freq: Every day | ORAL | Status: DC | PRN
Start: 2022-03-05 — End: 2022-03-06

## 2022-03-05 MED ORDER — APIXABAN 5 MG PO TABS: 5.0000 mg | ORAL_TABLET | Freq: Two times a day (BID) | ORAL | Status: DC

## 2022-03-05 MED ORDER — FLUOXETINE HCL 20 MG PO CAPS
20.0000 mg | ORAL_CAPSULE | Freq: Every day | ORAL | Status: DC
  Administered 2022-03-05: 20 mg via ORAL
  Filled 2022-03-05: qty 1

## 2022-03-05 MED ORDER — AMLODIPINE BESYLATE 5 MG PO TABS
5.0000 mg | ORAL_TABLET | Freq: Every day | ORAL | Status: DC
  Filled 2022-03-05: qty 1

## 2022-03-05 MED ORDER — ORAL CARE MOUTH RINSE: 15.0000 mL | OROMUCOSAL | Status: DC | PRN

## 2022-03-05 MED ORDER — ONDANSETRON HCL 4 MG PO TABS: 8.0000 mg | ORAL_TABLET | Freq: Two times a day (BID) | ORAL | Status: DC | PRN

## 2022-03-05 MED ORDER — PREGABALIN 75 MG PO CAPS
75.0000 mg | ORAL_CAPSULE | Freq: Two times a day (BID) | ORAL | Status: DC
  Administered 2022-03-05: 75 mg via ORAL
  Filled 2022-03-05: qty 1

## 2022-03-05 MED ORDER — ONDANSETRON HCL 4 MG PO TABS
4.0000 mg | ORAL_TABLET | Freq: Four times a day (QID) | ORAL | Status: DC | PRN
  Administered 2022-03-06: 4 mg via ORAL
  Filled 2022-03-05: qty 1

## 2022-03-05 MED ORDER — SODIUM CHLORIDE 0.9 % IV BOLUS (SEPSIS): 250.0000 mL | Freq: Once | INTRAVENOUS | Status: DC

## 2022-03-05 MED ORDER — ONDANSETRON HCL 4 MG/2ML IJ SOLN: 4.0000 mg | Freq: Four times a day (QID) | INTRAMUSCULAR | Status: DC | PRN

## 2022-03-05 NOTE — Assessment & Plan Note (Signed)
-   We will continue Lyrica. 

## 2022-03-05 NOTE — ED Notes (Signed)
PTT 192 per lab.  [place] notified.

## 2022-03-05 NOTE — ED Notes (Signed)
Pt reports taking pills "is an ordeal."  However, he is will to try to take them w/ applesauce.

## 2022-03-05 NOTE — Sepsis Progress Note (Signed)
[facility] following for Sepsis Protocol 

## 2022-03-05 NOTE — ED Notes (Signed)
Lactic 2.3 per lab [place] notified.

## 2022-03-05 NOTE — Assessment & Plan Note (Signed)
-   This could be related/associated with acute bronchitis. - It should be covered with broad-spectrum antibiotic therapy.

## 2022-03-05 NOTE — Assessment & Plan Note (Signed)
-   We will hold off further blood thinning with Eliquis.

## 2022-03-05 NOTE — Assessment & Plan Note (Addendum)
-   He responded to a 10 mg IV Cardizem bolus. - We will continue monitoring for recurrence. - This could be related to his dehydration. - We will hold Eliquis given.

## 2022-03-05 NOTE — H&P (Signed)
[place]   PATIENT NAME: Phillip [name]    MR#:  [id number]  DATE OF BIRTH:  1941-04-12  DATE OF ADMISSION:  03/04/2022  PRIMARY CARE PHYSICIAN: [name], MD   Patient is coming from: Home  REQUESTING/REFERRING PHYSICIAN: Lurline Hare, MD CHIEF COMPLAINT:   Chief Complaint  Patient presents with   Fall    HISTORY OF PRESENT ILLNESS:  Phillip [name] is a 81 y.o. Caucasian male with medical history significant for coronary artery disease, diverticulosis, hearing loss, hypertension, paroxysmal atrial fibrillation on Eliquis, dyslipidemia, ischemic cardiomyopathy, coronary artery disease, as well as oral cancer status postchemotherapy and radiotherapy with last session being on Monday, who presented to the emergency room with concerns of generalized weakness and a couple of falls within 3 hours.  The patient was initially noted to be in atrial fibrillation with rapid ventricular response of 160 that responded to 10 mg of IV Cardizem.  The patient recently refused to have a PEG tube and wished to have hospice care.  He has been to his oncologist office to 3 times this week for IV fluids.  No fever or chills.  No cough or wheezing or dyspnea.  No chest pain.  No nausea or vomiting or abdominal pain.  No dysuria, oliguria or hematuria or flank pain.  ED Course: Upon presentation to the emergency room, heart rate was 113 with otherwise normal vital signs.  Labs revealed glucose of 139 with BUN of 24 with calcium of 8.4 and albumin 2.8 with total protein of 5.3.  High-sensitivity troponin was 21.  Lactic acid was 1.3.  COVID-19 PCR is currently pending.  CBC showed leukopenia of 1 and neutropenia with ANC of 0.5 and hemoglobin of 12.1 and hematocrit 36.9 and slightly below previous levels 5 days ago, platelets 121 compared to 144 then.  INR was 2.5 and PT 26.4 with PTT of 192.  EKG as reviewed by me : Fish oil.-EKG showed atrial fibrillation with rapid ventricular response of 133 with  left bundle branch block and PVCs. Imaging: Portable chest ray showed no acute cardiopulmonary disease.  Noncontrasted CT scan revealed no acute intracranial abnormalities.  The patient was given 1 L bolus of IV lactated Ringer's and 1 L bolus of IV normal saline, 10 mg of IV Cardizem and broad-spectrum antibiotic coverage with IV vancomycin, cefepime and Flagyl.  He will be admitted to a cardiac telemetry bed for further evaluation and management. PAST MEDICAL HISTORY:   Past Medical History:  Diagnosis Date   Anxiety    Basal cell carcinoma 09/12/2018   Left forehead above lateral brow. Nodular pattern   Cancer ([place])    Coronary artery disease 2003   a. nonobstructive CAD by cath in 2003 and 2005; b.  Status post three-vessel CABG on 12/12/2017 with LIMA to LAD, sequential reverse SVG to OM1 and distal LCx   COVID-19 03/2020   Diverticulosis    Hearing loss    bilateral   Heart disease    Heart murmur    History of benign prostatic hypertrophy    History of radiation therapy 01/31/10-03/22/10   right tonsil/right neck node 7000 cGy 35 sessions, high risk lymph node volume 5940 cGy 35 sessions, low risk lymph node vol 5600 cGy 35 sessions   HPV in male    positive   HTN (hypertension)    Hyperlipidemia    diet controlled- taking medication d/t ensure drinking   Hypothyroid 01/16/2013   Ischemic cardiomyopathy  a. 10/2017: echo showing reduced EF of 40-45%, HK of the anterior, anteroseptal and apical myocardium with Grade 1 DD.    Neck pain    Persistent atrial fibrillation (HCC)    Primary squamous cell carcinoma of skin of lip 2023   lip and tonuge   Squamous cell carcinoma    right tonsil- 2011   Squamous cell carcinoma of skin 09/02/2019   Crown scalp. WD SCC   Squamous cell carcinoma of skin 09/02/2019   Left distal lateral deltoid. SCCis arising in SK   Thrombocytopenia ([place]) 02/02/2012   unclear etiology   Tinnitus    Tubular adenoma of colon     PAST SURGICAL  HISTORY:   Past Surgical History:  Procedure Laterality Date   artoscopic knee     CARDIAC CATHETERIZATION  2003, 2005   40% blockage, two 30% blockages treated medically Ron Parker)   CARDIOVERSION N/A 02/12/2018   Procedure: CARDIOVERSION;  Surgeon: [name], MD;  Location: [facility];  Service: Cardiovascular;  Laterality: N/A;   CARDIOVERSION N/A 05/10/2021   Procedure: CARDIOVERSION;  Surgeon: [name], MD;  Location: [facility];  Service: Cardiovascular;  Laterality: N/A;   COLONOSCOPY  03/2015   TA x3, HP, melanosis coli, severe diverticulosis, rpt 3 yrs ([place])   COLONOSCOPY  01/2019   fair prep, diverticulosis, f/u PRN ([place])   CORONARY ARTERY BYPASS GRAFT N/A 12/12/2017   Procedure: CORONARY ARTERY BYPASS GRAFTING (CABG) x3 using the right greater saphenous vein harvested endoscopically and the left internal mammary artery. LIMA to LAD, SEQ SVG to OM1 & OM2;  Surgeon: [name], MD;  Location: [facility];  Service: Open Heart Surgery;  Laterality: N/A;   DENTAL SURGERY     extractions   IR THORACENTESIS ASP PLEURAL SPACE W/IMG GUIDE  01/17/2018   KNEE ARTHROSCOPY Left    LEFT HEART CATH AND CORONARY ANGIOGRAPHY N/A 12/10/2017   Procedure: LEFT HEART CATH AND CORONARY ANGIOGRAPHY;  Surgeon: [name], MD;  Location: [facility];  Service: Cardiovascular;  Laterality: N/A;   PEG PLACEMENT  02/2010   PORTA CATH INSERTION N/A 01/26/2022   Procedure: PORTA CATH INSERTION;  Surgeon: Katha Cabal, MD;  Location: [facility];  Service: Cardiovascular;  Laterality: N/A;   TEE WITHOUT CARDIOVERSION N/A 12/12/2017   Procedure: TRANSESOPHAGEAL ECHOCARDIOGRAM (TEE);  Surgeon: [name], MD;  Location: [facility];  Service: Open Heart Surgery;  Laterality: N/A;   TONSILLECTOMY     triple bypass  11/2017   VASECTOMY      SOCIAL HISTORY:   Social History   Tobacco Use   Smoking status: Never   Smokeless tobacco: Never  Substance Use Topics    Alcohol use: Not Currently    Comment: glass of wine 1-2 times a month    FAMILY HISTORY:   Family History  Problem Relation Age of Onset   Coronary artery disease Father    Heart attack Father 106   Heart disease Father    Stroke Mother    Colon cancer Neg Hx    Esophageal cancer Neg Hx    Rectal cancer Neg Hx    Stomach cancer Neg Hx    Kidney cancer Neg Hx    Kidney failure Neg Hx    Prostate cancer Neg Hx    Tuberculosis Neg Hx     DRUG ALLERGIES:   Allergies  Allergen Reactions   Ace Inhibitors Swelling and Other (See Comments)    Possible angioedema (upper lip  swelling)   Morphine And Related Swelling    SWELLING REACTION UNSPECIFIED  [severity rated per PMH, 12/11/2017]   Motrin [Ibuprofen]     REVIEW OF SYSTEMS:   ROS As per history of present illness. All pertinent systems were reviewed above. Constitutional, HEENT, cardiovascular, respiratory, GI, GU, musculoskeletal, neuro, psychiatric, endocrine, integumentary and hematologic systems were reviewed and are otherwise negative/unremarkable except for positive findings mentioned above in the HPI.   MEDICATIONS AT HOME:   Prior to Admission medications   Medication Sig Start Date End Date Taking? Authorizing Provider  amLODipine (NORVASC) 5 MG tablet Take 1 tablet by mouth once daily 03/01/22  Yes End, Harrell Gave, MD  apixaban (ELIQUIS) 5 MG TABS tablet Take 1 tablet (5 mg total) by mouth 2 (two) times daily. Please restart your Eliquis tomorrow morning 01/26/22  Yes Schnier, Dolores Lory, MD  Cholecalciferol (VITAMIN D3) 25 MCG (1000 UT) CAPS Take 1 capsule by mouth daily.   Yes [provider]  docusate sodium (COLACE) 250 MG capsule Takes total '600mg'$  /day 04/25/19  Yes [name], MD  fluconazole (DIFLUCAN) 200 MG tablet Take 1 tablet (200 mg total) by mouth daily. 02/27/22  Yes Sindy Guadeloupe, MD  fluocinonide gel (LIDEX) 0.05 %  09/16/21  Yes [provider]  FLUoxetine (PROZAC) 20 MG  capsule Take 1 capsule (20 mg total) by mouth daily. 12/28/21  Yes [name], MD  furosemide (LASIX) 20 MG tablet Take 1 tablet by mouth once daily 02/22/22  Yes End, Harrell Gave, MD  levothyroxine (EUTHYROX) 50 MCG tablet Take 1 tablet (50 mcg total) by mouth daily before breakfast. 09/01/21  Yes [name], MD  lidocaine-prilocaine (EMLA) cream Apply to affected area once 01/18/22  Yes Sindy Guadeloupe, MD  MAGNESIUM PO Take 1 tablet by mouth daily.   Yes [provider]  Multiple Vitamins-Minerals (ZINC PO) Take by mouth daily.   Yes [provider]  Omega-3 Fatty Acids (OMEGA 3 PO) Take by mouth daily.   Yes [provider]  Potassium 99 MG TABS Take 1 tablet by mouth daily.   Yes [provider]  pregabalin (LYRICA) 75 MG capsule Take 1 capsule (75 mg total) by mouth 2 (two) times daily. 02/13/22  Yes Sindy Guadeloupe, MD  rosuvastatin (CRESTOR) 20 MG tablet Take 1 tablet by mouth once daily 01/30/22  Yes End, Harrell Gave, MD  VITAMIN A PO Take 2,400 mcg by mouth daily.    Yes [provider]  vitamin E 400 UNIT capsule Take 400 Units by mouth daily.   Yes [provider]  Zinc 50 MG CAPS Take 50 mg by mouth daily.   Yes [provider]  Benzocaine (HURRICAINE) 20 % AERO Use as directed 1 Application in the mouth or throat as needed. 02/02/22   Borders, Kirt Boys, NP  dexamethasone (DECADRON) 4 MG tablet Take 2 tablets (8 mg total) by mouth daily. Start the day after chemotherapy for 2 days. 01/18/22   Sindy Guadeloupe, MD  nitroGLYCERIN (NITROSTAT) 0.4 MG SL tablet Place 1 tablet (0.4 mg total) under the tongue every 5 (five) minutes as needed for chest pain (Maximum 3 doses.). 12/24/19   End, Harrell Gave, MD  ondansetron (ZOFRAN) 8 MG tablet Take 1 tablet (8 mg total) by mouth 2 (two) times daily as needed for refractory nausea / vomiting. Start on day 3 after chemo. 01/30/22   Sindy Guadeloupe, MD  ondansetron (ZOFRAN-ODT) 4 MG  disintegrating tablet Take 1 tablet (4 mg  total) by mouth every 8 (eight) hours as needed for nausea or vomiting. 04/20/20   [name], MD  polyethylene glycol (MIRALAX / GLYCOLAX) 17 g packet Take 17 g by mouth daily as needed.    [provider]  prochlorperazine (COMPAZINE) 10 MG tablet Take 1 tablet (10 mg total) by mouth every 6 (six) hours as needed (Nausea or vomiting). 01/18/22   Sindy Guadeloupe, MD  traMADol (ULTRAM) 50 MG tablet Take 1 tablet (50 mg total) by mouth every 6 (six) hours as needed. 01/20/22   Sindy Guadeloupe, MD  zaleplon (SONATA) 10 MG capsule Take 1 capsule (10 mg total) by mouth at bedtime as needed for sleep. 10/24/19   [name], MD      VITAL SIGNS:  Blood pressure 116/82, pulse 100, temperature 98.6 F (37 C), temperature source Axillary, resp. rate 20, height '6\' 2"'$  (1.88 m), weight 70.8 kg, SpO2 96 %.  PHYSICAL EXAMINATION:  Physical Exam  GENERAL:  81 y.o.-year-old Caucasian male patient lying in the bed with no acute distress.  EYES: Pupils equal, round, reactive to light and accommodation. No scleral icterus. Extraocular muscles intact.  HEENT: Head atraumatic, normocephalic. Oropharynx and nasopharynx clear.  NECK:  Supple, no jugular venous distention. No thyroid enlargement, no tenderness.  LUNGS: Normal breath sounds bilaterally, no wheezing, rales,rhonchi or crepitation. No use of accessory muscles of respiration.  CARDIOVASCULAR: Regular rate and rhythm, S1, S2 normal. No murmurs, rubs, or gallops.  ABDOMEN: Soft, nondistended, nontender. Bowel sounds present. No organomegaly or mass.  EXTREMITIES: No pedal edema, cyanosis, or clubbing.  NEUROLOGIC: Cranial nerves II through XII are intact. Muscle strength 5/5 in all extremities. Sensation intact. Gait not checked.  PSYCHIATRIC: The patient is alert and oriented x 3.  Normal affect and good eye contact. SKIN: Swollen lower lip with ulceration and scabbing.   LABORATORY PANEL:    CBC Recent Labs  Lab 03/04/22 2330  WBC 1.0*  HGB 12.1*  HCT 36.9*  PLT 121*   ------------------------------------------------------------------------------------------------------------------  Chemistries  Recent Labs  Lab 03/05/22 0032  NA 140  K 3.6  CL 109  CO2 26  GLUCOSE 139*  BUN 24*  CREATININE 0.83  CALCIUM 8.4*  AST 22  ALT 28  ALKPHOS 38  BILITOT 0.9   ------------------------------------------------------------------------------------------------------------------  Cardiac Enzymes No results for input(s): "TROPONINI" in the last 168 hours. ------------------------------------------------------------------------------------------------------------------  RADIOLOGY:  CT Head Wo Contrast  Result Date: 03/05/2022 CLINICAL DATA:  Head trauma, minor (Age >= 65y) EXAM: CT HEAD WITHOUT CONTRAST TECHNIQUE: Contiguous axial images were obtained from the base of the skull through the vertex without intravenous contrast. RADIATION DOSE REDUCTION: This exam was performed according to the departmental dose-optimization program which includes automated exposure control, adjustment of the mA and/or kV according to patient size and/or use of iterative reconstruction technique. COMPARISON:  None Available. FINDINGS: Brain: Normal anatomic configuration. Parenchymal volume loss is commensurate with the patient's age. Mild periventricular white matter changes are present likely reflecting the sequela of small vessel ischemia. Dilated perivascular space versus remote lacunar infarct within the right basal ganglia. No abnormal intra or extra-axial mass lesion or fluid collection. No abnormal mass effect or midline shift. No evidence of acute intracranial hemorrhage or infarct. Ventricular size is normal. Cerebellum unremarkable. Vascular: No asymmetric hyperdense vasculature at the skull base. Skull: Intact Sinuses/Orbits: Paranasal sinuses are clear. Orbits are unremarkable. Other:  Mastoid air cells and middle ear cavities are clear. IMPRESSION: 1. No acute intracranial abnormality. No calvarial fracture.  2. Mild senescent change. Electronically Signed   By: Fidela Salisbury M.D.   On: 03/05/2022 00:08   DG Chest Port 1 View  Result Date: 03/04/2022 CLINICAL DATA:  Questionable sepsis. EXAM: PORTABLE CHEST 1 VIEW COMPARISON:  Chest radiograph dated 09/16/2018. FINDINGS: Right-sided Port-A-Cath with tip at the cavoatrial junction. No focal consolidation, pleural effusion or pneumothorax. The cardiac silhouette is within normal limits. Median sternotomy wires. No acute osseous pathology. IMPRESSION: No active cardiopulmonary disease. Electronically Signed   By: Anner Crete M.D.   On: 03/04/2022 23:50      IMPRESSION AND PLAN:  Assessment and Plan: * Pancytopenia ([place]) - The patient will be admitted to a cardiac telemetry bed. - We will place him on reverse isolation for pancytopenia. - Oncology consult will be obtained. -I notified Dr. Grayland Ormond about patient. - We will follow daily CBC. - We will place him on broad-spectrum antibiotic therapy with IV vancomycin, cefepime and Flagyl.  Lower respiratory infection - This could be related/associated with acute bronchitis. - It should be covered with broad-spectrum antibiotic therapy.  Hypothyroidism - We will continue Synthroid  Coagulopathy ([place]) - We will hold off further blood thinning with Eliquis.  Peripheral neuropathy - We will continue Lyrica.  Depression - We will continue Prozac.  Chronic atrial fibrillation with RVR (HCC) - He responded to a 10 mg IV Cardizem bolus. - We will continue monitoring for recurrence. - This could be related to his dehydration. - We will hold Eliquis given.  Hyperlipidemia LDL goal <70 - We will continue statin therapy and fish oil.   DVT prophylaxis: Lovenox.  Advanced Care Planning:  Code Status: full code.  Family Communication:  The plan of care was discussed  in details with the patient (and family). I answered all questions. The patient agreed to proceed with the above mentioned plan. Further management will depend upon hospital course. Disposition Plan: Back to previous home environment Consults called: none.  All the records are reviewed and case discussed with ED provider.  Status is: Inpatient  At the time of the admission, it appears that the appropriate admission status for this patient is inpatient.  This is judged to be reasonable and necessary in order to provide the required intensity of service to ensure the patient's safety given the presenting symptoms, physical exam findings and initial radiographic and laboratory data in the context of comorbid conditions.  The patient requires inpatient status due to high intensity of service, high risk of further deterioration and high frequency of surveillance required.  I certify that at the time of admission, it is my clinical judgment that the patient will require inpatient hospital care extending more than 2 midnights.                            Dispo: The patient is from: Home              Anticipated d/c is to: Home              Patient currently is not medically stable to d/c.              Difficult to place patient: No  Christel Mormon M.D on 03/05/2022 at 4:29 AM  [facility]   From 7 PM-7 AM, contact night-coverage [email or web address]  CC: Primary care physician; [name], MD

## 2022-03-05 NOTE — ED Provider Notes (Signed)
[facility] Provider Note    Event Date/Time   First MD Initiated Contact with Patient 03/04/22 2317     (approximate)   History   Fall   HPI  Phillip [name] is a 81 y.o. male brought to the ED via EMS from home with a chief complaint of multiple falls and atrial fibrillation with rapid ventricular rate.  Patient with a history of CAD, hypertension, ischemic cardiomyopathy, oral cancer on chemotherapy and radiation, atrial fibrillation on Eliquis who has had 2 falls within the past 3 hours due to generalized weakness.  Patient had chemotherapy on Monday.  He decided at that time against PEG tube and wished to enter hospice care.  He has been to his oncologist office 2 or 3 times for IV fluids this week.  Denies fever, cough, chest pain, shortness of breath, abdominal pain, nausea, vomiting or dizziness.  Endorses palpitations.     Past Medical History   Past Medical History:  Diagnosis Date   Anxiety    Basal cell carcinoma 09/12/2018   Left forehead above lateral brow. Nodular pattern   Cancer ([place])    Coronary artery disease 2003   a. nonobstructive CAD by cath in 2003 and 2005; b.  Status post three-vessel CABG on 12/12/2017 with LIMA to LAD, sequential reverse SVG to OM1 and distal LCx   COVID-19 03/2020   Diverticulosis    Hearing loss    bilateral   Heart disease    Heart murmur    History of benign prostatic hypertrophy    History of radiation therapy 01/31/10-03/22/10   right tonsil/right neck node 7000 cGy 35 sessions, high risk lymph node volume 5940 cGy 35 sessions, low risk lymph node vol 5600 cGy 35 sessions   HPV in male    positive   HTN (hypertension)    Hyperlipidemia    diet controlled- taking medication d/t ensure drinking   Hypothyroid 01/16/2013   Ischemic cardiomyopathy    a. 10/2017: echo showing reduced EF of 40-45%, HK of the anterior, anteroseptal and apical myocardium with Grade 1 DD.    Neck pain    Persistent atrial  fibrillation (HCC)    Primary squamous cell carcinoma of skin of lip 2023   lip and tonuge   Squamous cell carcinoma    right tonsil- 2011   Squamous cell carcinoma of skin 09/02/2019   Crown scalp. WD SCC   Squamous cell carcinoma of skin 09/02/2019   Left distal lateral deltoid. SCCis arising in SK   Thrombocytopenia ([place]) 02/02/2012   unclear etiology   Tinnitus    Tubular adenoma of colon      Active Problem List   Patient Active Problem List   Diagnosis Date Noted   Goals of care, counseling/discussion 01/18/2022   Squamous cell carcinoma of oral cavity ([place]) 01/18/2022   Intermittent explosive disorder in adult 12/29/2021   Squamous cell carcinoma of buccal mucosa ([place]) 11/09/2021   Atrial fibrillation ([place]) 04/06/2021   Postural dizziness with presyncope 04/06/2021   Irritability and anger 10/30/2020   Medicare annual wellness visit, subsequent 04/28/2019   Health maintenance examination 04/28/2019   Chronic constipation 04/28/2019   Chronic heart failure with preserved ejection fraction (HFpEF) ([place]) 03/20/2018   Ischemic cardiomyopathy 03/20/2018   Typical atrial flutter ([place]) 01/07/2018   Decreased appetite 01/07/2018   Chronic bilateral pleural effusions 01/07/2018   Insomnia 12/21/2017   S/P CABG x 3 12/12/2017   Detrusor instability of bladder 10/31/2017  LVH (left ventricular hypertrophy) 10/24/2017   Angioedema 06/19/2017   Adverse effect of radiation 12/05/2016   Advanced care planning/counseling discussion 27/10/5007   Systolic murmur 38/18/2993   Bilateral sensorineural hearing loss 02/03/2016   Hyperlipidemia LDL goal <70 10/24/2015   Prediabetes 08/20/2015   Nail fungal infection 12/15/2013   Right leg pain 11/05/2013   Impotence, organic 11/04/2013   Bilateral carotid artery stenosis 11/04/2013   Hypothyroidism, acquired 01/16/2013   Thrombocytopenia ([place]) 02/02/2012   Decreased libido 06/23/2010   History of cancer tonsil 03/28/2010   SINUS  BRADYCARDIA 09/04/2008   Essential hypertension 06/24/2007   Coronary artery disease involving native coronary artery of native heart without angina pectoris 06/24/2007   NECK PAIN 06/24/2007   BPH associated with nocturia 06/24/2007     Past Surgical History   Past Surgical History:  Procedure Laterality Date   artoscopic knee     CARDIAC CATHETERIZATION  2003, 2005   40% blockage, two 30% blockages treated medically Ron Parker)   CARDIOVERSION N/A 02/12/2018   Procedure: CARDIOVERSION;  Surgeon: [name], MD;  Location: [facility];  Service: Cardiovascular;  Laterality: N/A;   CARDIOVERSION N/A 05/10/2021   Procedure: CARDIOVERSION;  Surgeon: [name], MD;  Location: [facility];  Service: Cardiovascular;  Laterality: N/A;   COLONOSCOPY  03/2015   TA x3, HP, melanosis coli, severe diverticulosis, rpt 3 yrs ([place])   COLONOSCOPY  01/2019   fair prep, diverticulosis, f/u PRN ([place])   CORONARY ARTERY BYPASS GRAFT N/A 12/12/2017   Procedure: CORONARY ARTERY BYPASS GRAFTING (CABG) x3 using the right greater saphenous vein harvested endoscopically and the left internal mammary artery. LIMA to LAD, SEQ SVG to OM1 & OM2;  Surgeon: [name], MD;  Location: [place];  Service: Open Heart Surgery;  Laterality: N/A;   DENTAL SURGERY     extractions   IR THORACENTESIS ASP PLEURAL SPACE W/IMG GUIDE  01/17/2018   KNEE ARTHROSCOPY Left    LEFT HEART CATH AND CORONARY ANGIOGRAPHY N/A 12/10/2017   Procedure: LEFT HEART CATH AND CORONARY ANGIOGRAPHY;  Surgeon: [name], MD;  Location: [place] [facility];  Service: Cardiovascular;  Laterality: N/A;   PEG PLACEMENT  02/2010   PORTA CATH INSERTION N/A 01/26/2022   Procedure: PORTA CATH INSERTION;  Surgeon: Katha Cabal, MD;  Location: [place] [facility];  Service: Cardiovascular;  Laterality: N/A;   TEE WITHOUT CARDIOVERSION N/A 12/12/2017   Procedure: TRANSESOPHAGEAL ECHOCARDIOGRAM (TEE);  Surgeon: [name], MD;   Location: [place];  Service: Open Heart Surgery;  Laterality: N/A;   TONSILLECTOMY     triple bypass  11/2017   VASECTOMY       Home Medications   Prior to Admission medications   Medication Sig Start Date End Date Taking? Authorizing Provider  amLODipine (NORVASC) 5 MG tablet Take 1 tablet by mouth once daily 03/01/22  Yes End, Harrell Gave, MD  apixaban (ELIQUIS) 5 MG TABS tablet Take 1 tablet (5 mg total) by mouth 2 (two) times daily. Please restart your Eliquis tomorrow morning 01/26/22  Yes Schnier, Dolores Lory, MD  Cholecalciferol (VITAMIN D3) 25 MCG (1000 UT) CAPS Take 1 capsule by mouth daily.   Yes [provider]  docusate sodium (COLACE) 250 MG capsule Takes total '600mg'$  /day 04/25/19  Yes [name], MD  fluconazole (DIFLUCAN) 200 MG tablet Take 1 tablet (200 mg total) by mouth daily. 02/27/22  Yes Sindy Guadeloupe, MD  fluocinonide gel (LIDEX) 0.05 %  09/16/21  Yes [provider]  FLUoxetine (PROZAC) 20 MG capsule Take 1 capsule (20 mg total) by mouth daily. 12/28/21  Yes [name], MD  furosemide (LASIX) 20 MG tablet Take 1 tablet by mouth once daily 02/22/22  Yes End, Harrell Gave, MD  levothyroxine (EUTHYROX) 50 MCG tablet Take 1 tablet (50 mcg total) by mouth daily before breakfast. 09/01/21  Yes [name], MD  lidocaine-prilocaine (EMLA) cream Apply to affected area once 01/18/22  Yes Sindy Guadeloupe, MD  MAGNESIUM PO Take 1 tablet by mouth daily.   Yes [provider]  Multiple Vitamins-Minerals (ZINC PO) Take by mouth daily.   Yes [provider]  Omega-3 Fatty Acids (OMEGA 3 PO) Take by mouth daily.   Yes [provider]  Potassium 99 MG TABS Take 1 tablet by mouth daily.   Yes [provider]  pregabalin (LYRICA) 75 MG capsule Take 1 capsule (75 mg total) by mouth 2 (two) times daily. 02/13/22  Yes Sindy Guadeloupe, MD  rosuvastatin (CRESTOR) 20 MG tablet Take 1 tablet by mouth once daily 01/30/22  Yes End,  Harrell Gave, MD  VITAMIN A PO Take 2,400 mcg by mouth daily.    Yes [provider]  vitamin E 400 UNIT capsule Take 400 Units by mouth daily.   Yes [provider]  Zinc 50 MG CAPS Take 50 mg by mouth daily.   Yes [provider]  Benzocaine (HURRICAINE) 20 % AERO Use as directed 1 Application in the mouth or throat as needed. 02/02/22   Borders, Kirt Boys, NP  dexamethasone (DECADRON) 4 MG tablet Take 2 tablets (8 mg total) by mouth daily. Start the day after chemotherapy for 2 days. 01/18/22   Sindy Guadeloupe, MD  nitroGLYCERIN (NITROSTAT) 0.4 MG SL tablet Place 1 tablet (0.4 mg total) under the tongue every 5 (five) minutes as needed for chest pain (Maximum 3 doses.). 12/24/19   End, Harrell Gave, MD  ondansetron (ZOFRAN) 8 MG tablet Take 1 tablet (8 mg total) by mouth 2 (two) times daily as needed for refractory nausea / vomiting. Start on day 3 after chemo. 01/30/22   Sindy Guadeloupe, MD  ondansetron (ZOFRAN-ODT) 4 MG disintegrating tablet Take 1 tablet (4 mg total) by mouth every 8 (eight) hours as needed for nausea or vomiting. 04/20/20   [name], MD  polyethylene glycol (MIRALAX / GLYCOLAX) 17 g packet Take 17 g by mouth daily as needed.    [provider]  prochlorperazine (COMPAZINE) 10 MG tablet Take 1 tablet (10 mg total) by mouth every 6 (six) hours as needed (Nausea or vomiting). 01/18/22   Sindy Guadeloupe, MD  traMADol (ULTRAM) 50 MG tablet Take 1 tablet (50 mg total) by mouth every 6 (six) hours as needed. 01/20/22   Sindy Guadeloupe, MD  zaleplon (SONATA) 10 MG capsule Take 1 capsule (10 mg total) by mouth at bedtime as needed for sleep. 10/24/19   [name], MD     Allergies  Ace inhibitors, Morphine and related, and Motrin [ibuprofen]   Family History   Family History  Problem Relation Age of Onset   Coronary artery disease Father    Heart attack Father 64   Heart disease Father    Stroke Mother    Colon cancer Neg Hx    Esophageal  cancer Neg Hx    Rectal cancer Neg Hx    Stomach cancer Neg Hx    Kidney cancer Neg Hx    Kidney failure Neg Hx  Prostate cancer Neg Hx    Tuberculosis Neg Hx      Physical Exam  Triage Vital Signs: ED Triage Vitals  Enc Vitals Group     BP 03/04/22 2324 115/82     Pulse Rate 03/04/22 2324 (!) 139     Resp 03/04/22 2324 (!) 26     Temp 03/04/22 2326 98.6 F (37 C)     Temp Source 03/04/22 2324 Axillary     SpO2 03/04/22 2345 97 %     Weight 03/04/22 2324 156 lb (70.8 kg)     Height 03/04/22 2324 '6\' 2"'$  (1.88 m)     Head Circumference --      Peak Flow --      Pain Score --      Pain Loc --      Pain Edu? --      Excl. in [place]? --     Updated Vital Signs: BP 113/75   Pulse 95   Temp 98.6 F (37 C) (Axillary)   Resp (!) 22   Ht '6\' 2"'$  (1.88 m)   Wt 70.8 kg   SpO2 97%   BMI 20.03 kg/m    General: Awake, moderate distress.  CV:  Tachycardic.  Irregularly irregular rate.  Good peripheral perfusion.  Resp:  Increased effort.  Loose cough noted.  Scattered rhonchi. Abd:  Nontender.  No distention.  Other:  Swollen right lip which patient states is baseline.  Bilateral calves are nontender and not swollen.   ED Results / Procedures / Treatments  Labs (all labs ordered are listed, but only abnormal results are displayed) Labs Reviewed  LACTIC ACID, PLASMA - Abnormal; Notable for the following components:      Result Value   Lactic Acid, Venous 2.3 (*)    All other components within normal limits  CBC WITH DIFFERENTIAL/PLATELET - Abnormal; Notable for the following components:   RBC 3.74 (*)    Hemoglobin 12.1 (*)    HCT 36.9 (*)    Platelets 121 (*)    Neutro Abs 0.5 (*)    Lymphs Abs 0.4 (*)    All other components within normal limits  PROTIME-INR - Abnormal; Notable for the following components:   Prothrombin Time 26.4 (*)    INR 2.5 (*)    All other components within normal limits  APTT - Abnormal; Notable for the following components:   aPTT 192 (*)     All other components within normal limits  BRAIN NATRIURETIC PEPTIDE - Abnormal; Notable for the following components:   B Natriuretic Peptide 148.0 (*)    All other components within normal limits  TROPONIN I (HIGH SENSITIVITY) - Abnormal; Notable for the following components:   Troponin I (High Sensitivity) 21 (*)    All other components within normal limits  CULTURE, BLOOD (ROUTINE X 2)  CULTURE, BLOOD (ROUTINE X 2)  URINE CULTURE  SARS CORONAVIRUS 2 BY RT PCR  LACTIC ACID, PLASMA  URINALYSIS, COMPLETE (UACMP) WITH MICROSCOPIC  COMPREHENSIVE METABOLIC PANEL  PROCALCITONIN  TROPONIN I (HIGH SENSITIVITY)     EKG  ED ECG REPORT I, Jalisia Puchalski J, the attending physician, personally viewed and interpreted this ECG.   Date: 03/05/2022  EKG Time: 2328  Rate: 133  Rhythm: atrial fibrillation, rate 133  Axis: LAD  Intervals:left bundle branch block  ST&T Change: Nonspecific    RADIOLOGY I have independently visualized and interpreted patient's CT and chest x-ray as well as noted the radiology interpretation:   CT head:  No ICH  Chest x-ray: No acute cardiopulmonary process  Official radiology report(s): CT Head Wo Contrast  Result Date: 03/05/2022 CLINICAL DATA:  Head trauma, minor (Age >= 65y) EXAM: CT HEAD WITHOUT CONTRAST TECHNIQUE: Contiguous axial images were obtained from the base of the skull through the vertex without intravenous contrast. RADIATION DOSE REDUCTION: This exam was performed according to the departmental dose-optimization program which includes automated exposure control, adjustment of the mA and/or kV according to patient size and/or use of iterative reconstruction technique. COMPARISON:  None Available. FINDINGS: Brain: Normal anatomic configuration. Parenchymal volume loss is commensurate with the patient's age. Mild periventricular white matter changes are present likely reflecting the sequela of small vessel ischemia. Dilated perivascular space versus  remote lacunar infarct within the right basal ganglia. No abnormal intra or extra-axial mass lesion or fluid collection. No abnormal mass effect or midline shift. No evidence of acute intracranial hemorrhage or infarct. Ventricular size is normal. Cerebellum unremarkable. Vascular: No asymmetric hyperdense vasculature at the skull base. Skull: Intact Sinuses/Orbits: Paranasal sinuses are clear. Orbits are unremarkable. Other: Mastoid air cells and middle ear cavities are clear. IMPRESSION: 1. No acute intracranial abnormality. No calvarial fracture. 2. Mild senescent change. Electronically Signed   By: Fidela Salisbury M.D.   On: 03/05/2022 00:08   DG Chest Port 1 View  Result Date: 03/04/2022 CLINICAL DATA:  Questionable sepsis. EXAM: PORTABLE CHEST 1 VIEW COMPARISON:  Chest radiograph dated 09/16/2018. FINDINGS: Right-sided Port-A-Cath with tip at the cavoatrial junction. No focal consolidation, pleural effusion or pneumothorax. The cardiac silhouette is within normal limits. Median sternotomy wires. No acute osseous pathology. IMPRESSION: No active cardiopulmonary disease. Electronically Signed   By: Anner Crete M.D.   On: 03/04/2022 23:50     PROCEDURES:  Critical Care performed: Yes, see critical care procedure note(s)  CRITICAL CARE Performed by: Paulette Blanch   Total critical care time: 45 minutes  Critical care time was exclusive of separately billable procedures and treating other patients.  Critical care was necessary to treat or prevent imminent or life-threatening deterioration.  Critical care was time spent personally by me on the following activities: development of treatment plan with patient and/or surrogate as well as nursing, discussions with consultants, evaluation of patient's response to treatment, examination of patient, obtaining history from patient or surrogate, ordering and performing treatments and interventions, ordering and review of laboratory studies, ordering and  review of radiographic studies, pulse oximetry and re-evaluation of patient's condition.   Marland Kitchen1-3 Lead EKG Interpretation  Performed by: Paulette Blanch, MD Authorized by: Paulette Blanch, MD     Interpretation: abnormal     ECG rate:  135   ECG rate assessment: tachycardic     Rhythm: atrial fibrillation     Ectopy: none     Conduction: normal   Comments:     Patient placed on cardiac monitor to evaluate for arrhythmias    MEDICATIONS ORDERED IN ED: Medications  sodium chloride 0.9 % bolus 1,000 mL (1,000 mLs Intravenous New Bag/Given 03/05/22 0055)    And  sodium chloride 0.9 % bolus 1,000 mL (has no administration in time range)    And  sodium chloride 0.9 % bolus 250 mL (has no administration in time range)  ceFEPIme (MAXIPIME) 2 g in sodium chloride 0.9 % 100 mL IVPB (has no administration in time range)  metroNIDAZOLE (FLAGYL) IVPB 500 mg (has no administration in time range)  vancomycin (VANCOREADY) IVPB 1500 mg/300 mL (has no administration in time range)  lactated ringers bolus 1,000 mL (0 mLs Intravenous Stopped 03/05/22 0055)  diltiazem (CARDIZEM) injection 10 mg (10 mg Intravenous Given 03/04/22 2340)     IMPRESSION / MDM / ASSESSMENT AND PLAN / ED COURSE  I reviewed the triage vital signs and the nursing notes.                             81 year old male presenting with multiple falls, generalized weakness, atrial fibrillation with RVR.  Differential diagnosis includes but is not limited to [place], sepsis, metabolic derangement, ACS, CVA, etc.  I have personally reviewed patient's records and note his hospice and palliative care visit from 03/02/2022.  Patient's presentation is most consistent with acute presentation with potential threat to life or bodily function.  The patient is on the cardiac monitor to evaluate for evidence of arrhythmia and/or significant heart rate changes.  EMS reports patient was hypotensive in the 80s; pressure on arrival 115/82.  We will obtain  sepsis work-up, CT head, chest x-ray.  Administer IV Cardizem for rate control and reassess.  Anticipate hospitalization.  Clinical Course as of 03/05/22 0056  Sun Mar 05, 2022  0049 Verbal from laboratory WBC 1.2; placed on neutropenic precautions.  Heart rate improved after IV Cardizem.  Awaiting CMP.  Spouse at bedside who confirms patient made decision to go into hospice; in fact they have an appointment on Monday.  They are agreeable for IV fluids and IV antibiotics.  Will await results of CMP and then consult hospitalist services for evaluation and admission. [JS]    Clinical Course User Index [JS] Paulette Blanch, MD     FINAL CLINICAL IMPRESSION(S) / ED DIAGNOSES   Final diagnoses:  Fall, initial encounter  Atrial fibrillation with rapid ventricular response ([place])  Pancytopenia ([place])  Sepsis, due to unspecified organism, unspecified whether acute organ dysfunction present [facility])     Rx / DC Orders   ED Discharge Orders     None        Note:  This document was prepared using Dragon voice recognition software and may include unintentional dictation errors.   Paulette Blanch, MD 03/05/22 [phone]

## 2022-03-05 NOTE — Progress Notes (Signed)
PHARMACIST - PHYSICIAN ORDER COMMUNICATION  CONCERNING: P&T Medication Policy on Herbal Medications  DESCRIPTION:  This patient's order for:  Potassium TABS 99 mg has been noted.  This product(s) is classified as an "herbal" or natural product. Due to a lack of definitive safety studies or FDA approval, nonstandard manufacturing practices, plus the potential risk of unknown drug-drug interactions while on inpatient medications, the Pharmacy and Therapeutics Committee does not permit the use of "herbal" or natural products of this type within [facility].   ACTION TAKEN: The pharmacy department is unable to verify this order at this time.  Please reevaluate patient's clinical condition at discharge and address if the herbal or natural product(s) should be resumed at that time.   Renda Rolls, PharmD, [name] 03/05/2022 1:57 AM

## 2022-03-05 NOTE — Progress Notes (Signed)
CODE SEPSIS - PHARMACY COMMUNICATION  **Broad Spectrum Antibiotics should be administered within 1 hour of Sepsis diagnosis**  Time Code Sepsis Called/Page Received: 0031  Antibiotics Ordered: Cefepime, Flagyl, & Vancomycin  Time of 1st antibiotic administration: 0057  Renda Rolls, PharmD, [facility] 03/05/2022 12:27 AM

## 2022-03-05 NOTE — Assessment & Plan Note (Addendum)
-   We will continue statin therapy and fish oil. 

## 2022-03-05 NOTE — Progress Notes (Signed)
Pharmacy Antibiotic Note  Phillip [name] is a 81 y.o. male admitted on 03/04/2022 with sepsis from unknown source.  Pharmacy has been consulted for Cefepime and Vancomycin dosing for 7 days.  Plan: Cefepime 2 gm q8h per indication & renal fxn.  Pt given initial dose of Vancomycin 1500 mg x 1. Vancomycin 1500 mg IV Q 24 hrs.  Goal AUC 400-550. Expected AUC: 479.0 SCr used: 0.83, TBW 70.8 kg < IBW 82.2 kg  Pharmacy will continue to follow and will adjust abx dosing whenever warranted.   Height: '6\' 2"'$  (188 cm) Weight: 70.8 kg (156 lb) IBW/kg (Calculated) : 82.2  Temp (24hrs), Avg:98.6 F (37 C), Min:98.6 F (37 C), Max:98.6 F (37 C)  Recent Labs  Lab 02/27/22 0845 03/04/22 2330 03/05/22 0032 03/05/22 0115  WBC 5.9 1.0*  --   --   CREATININE 0.97  --  0.83  --   LATICACIDVEN  --  2.3*  --  1.3    Estimated Creatinine Clearance: 68.7 mL/min (by C-G formula based on SCr of 0.83 mg/dL).    Allergies  Allergen Reactions   Ace Inhibitors Swelling and Other (See Comments)    Possible angioedema (upper lip swelling)   Morphine And Related Swelling    SWELLING REACTION UNSPECIFIED  [severity rated per PMH, 12/11/2017]   Motrin [Ibuprofen]     Antimicrobials this admission: 7/16 Cefepime >> x 7 days 7/16 Vancomycin >> x 7 days 7/16 Flagyl >> x 7 days  Microbiology results: 7/16 BCx: Pending 7/16 UCx: Pending   Thank you for allowing pharmacy to be a part of this patient's care.  Renda Rolls, PharmD, [facility] 03/05/2022 3:49 AM

## 2022-03-05 NOTE — Progress Notes (Signed)
PHARMACY -  BRIEF ANTIBIOTIC NOTE   Pharmacy has received consult(s) for Cefepime & Vancomycin from an ED provider.  [name] profile has been reviewed for ht/wt/allergies/indication/available labs.    One time order(s) placed for Cefepime 2 gm & Vancomycin 1500 mg per pt wt: 70.8 kg.  Further antibiotics/pharmacy consults should be ordered by admitting physician if indicated.                       Thank you, Renda Rolls, PharmD, [facility] 03/05/2022 12:26 AM

## 2022-03-05 NOTE — Progress Notes (Signed)
PROGRESS NOTE    KARSEN NAKANISHI  FBP:[id number] DOB: 1940-12-24 DOA: 03/04/2022 PCP: [name], MD   Assessment & Plan:   Principal Problem:   Pancytopenia [facility]) Active Problems:   Lower respiratory infection   Hypothyroidism   Coagulopathy ([place])   Hyperlipidemia LDL goal <70   Chronic atrial fibrillation with RVR ([place])   Depression   Peripheral neuropathy  Assessment and Plan: Failure to thrive: likely secondary to oral cancer & not eating or drinking for several weeks. Hospice will see pt tomorrow as per pt's wife   Pancytopenia: likely secondary to recent chemo. Continue on neutropenic precautions. Continue on broad spectrum abxs but unclear source. Blood & urine cxs are pending   Oral cancer: s/p chemo and radiation. Management as per onco. Continue on neutropenic precautions  Hypokalemia: potassium given    Possible upper respiratory infection: CXR showed no acute cardiopulmonary process, so r/o lower respiratory   Hypothyroidism: continue on home dose of levothyroxine   Peripheral neuropathy: continue on home dose of pregabalin    Depression: severity unknown. Continue on home dose of fluoxetine    Chronic  a. fib: w/ RVR. S/p IV cardizem x1. Continue on amlodipine. Holding eliquis    HLD: continue on statin   Hypocalcemia: corrected Ca 8.0. Will give Ca gluconate     DVT prophylaxis: eliquis  Code Status: full  Family Communication: discussed pt's care w/ pt's wife, Fraser Din, and answered her questions Disposition Plan: likely will be made comfort care tomorrow    Level of care: Telemetry Cardiac  Status is: Inpatient Remains inpatient appropriate because: severity of illness    Consultants:    Procedures:  Antimicrobials:    Subjective: Pt c/o mouth pain   Objective: Vitals:   03/05/22 0400 03/05/22 0430 03/05/22 0700 03/05/22 0730  BP: 130/84 104/78 112/75 120/78  Pulse: (!) 111 (!) 105 90 96  Resp: '17 18 17 '$ (!) 32  Temp:       TempSrc:      SpO2: 95% 98% 94% 96%  Weight:      Height:       No intake or output data in the 24 hours ending 03/05/22 0809 Filed Weights   03/04/22 2324  Weight: 70.8 kg    Examination:  General exam: Appears calm but uncomfortable. Frail appearing.  Respiratory system: Clear to auscultation. Respiratory effort normal. Cardiovascular system: S1 & S2 +. No rubs, gallops or clicks.  Gastrointestinal system: Abdomen is nondistended, soft and nontender. Normal bowel sounds heard. Central nervous system: Alert and oriented. Moves all extremities  Skin: erythema, edema of lips/mouth, foul smelling  Psychiatry: Judgement and insight appear normal. Mood & affect appropriate.     Data Reviewed: I have personally reviewed following labs and imaging studies  CBC: Recent Labs  Lab 02/27/22 0845 03/04/22 2330 03/05/22 0404  WBC 5.9 1.0* 1.5*  NEUTROABS 4.5 0.5*  --   HGB 13.6 12.1* 11.5*  HCT 39.9 36.9* 34.6*  MCV 97.3 98.7 98.9  PLT 144* 121* 85*   Basic Metabolic Panel: Recent Labs  Lab 02/27/22 0845 03/05/22 0032 03/05/22 0404  NA 137 140 135  K 4.2 3.6 3.3*  CL 102 109 106  CO2 25 26 21*  GLUCOSE 159* 139* 107*  BUN 27* 24* 19  CREATININE 0.97 0.83 0.58*  CALCIUM 8.8* 8.4* 7.0*   GFR: Estimated Creatinine Clearance: 71.3 mL/min (A) (by C-G formula based on SCr of 0.58 mg/dL (L)). Liver Function Tests: Recent Labs  Lab 02/27/22  0845 03/05/22 0032  AST 36 22  ALT 41 28  ALKPHOS 46 38  BILITOT 1.3* 0.9  PROT 5.9* 5.3*  ALBUMIN 3.2* 2.8*   No results for input(s): "LIPASE", "AMYLASE" in the last 168 hours. No results for input(s): "AMMONIA" in the last 168 hours. Coagulation Profile: Recent Labs  Lab 03/04/22 2330 03/05/22 0404  INR 2.5* 2.0*   Cardiac Enzymes: No results for input(s): "CKTOTAL", "CKMB", "CKMBINDEX", "TROPONINI" in the last 168 hours. BNP (last 3 results) No results for input(s): "PROBNP" in the last 8760 hours. HbA1C: No  results for input(s): "HGBA1C" in the last 72 hours. CBG: No results for input(s): "GLUCAP" in the last 168 hours. Lipid Profile: No results for input(s): "CHOL", "HDL", "LDLCALC", "TRIG", "CHOLHDL", "LDLDIRECT" in the last 72 hours. Thyroid Function Tests: No results for input(s): "TSH", "T4TOTAL", "FREET4", "T3FREE", "THYROIDAB" in the last 72 hours. Anemia Panel: No results for input(s): "VITAMINB12", "FOLATE", "FERRITIN", "TIBC", "IRON", "RETICCTPCT" in the last 72 hours. Sepsis Labs: Recent Labs  Lab 03/04/22 2330 03/05/22 0032 03/05/22 0115 03/05/22 0404  PROCALCITON  --  <0.10  --   --   LATICACIDVEN 2.3*  --  1.3 1.1    Recent Results (from the past 240 hour(s))  Blood Culture (routine x 2)     Status: None (Preliminary result)   Collection Time: 03/04/22 11:30 PM   Specimen: Porta Cath; Blood  Result Value Ref Range Status   Specimen Description PORTA CATH  Final   Special Requests   Final    BOTTLES DRAWN AEROBIC AND ANAEROBIC Blood Culture adequate volume   Culture   Final    NO GROWTH < 12 HOURS Performed at [facility], [address], [place], [place] [address]    Report Status PENDING  Incomplete  SARS Coronavirus 2 by RT PCR (hospital order, performed in [place] hospital lab) *cepheid single result test* Anterior Nasal Swab     Status: None   Collection Time: 03/05/22  1:04 AM   Specimen: Anterior Nasal Swab  Result Value Ref Range Status   SARS Coronavirus 2 by RT PCR NEGATIVE NEGATIVE Final    Comment: (NOTE) SARS-CoV-2 target nucleic acids are NOT DETECTED.  The SARS-CoV-2 RNA is generally detectable in upper and lower respiratory specimens during the acute phase of infection. The lowest concentration of SARS-CoV-2 viral copies this assay can detect is 250 copies / mL. A negative result does not preclude SARS-CoV-2 infection and should not be used as the sole basis for treatment or other patient management decisions.  A negative result  may occur with improper specimen collection / handling, submission of specimen other than nasopharyngeal swab, presence of viral mutation(s) within the areas targeted by this assay, and inadequate number of viral copies (<250 copies / mL). A negative result must be combined with clinical observations, patient history, and epidemiological information.  Fact Sheet for Patients:   https://www.patel.info/  Fact Sheet for Healthcare Providers: https://hall.com/  This test is not yet approved or  cleared by the [place] FDA and has been authorized for detection and/or diagnosis of SARS-CoV-2 by FDA under an Emergency Use Authorization (EUA).  This EUA will remain in effect (meaning this test can be used) for the duration of the COVID-19 declaration under Section 564(b)(1) of the Act, 21 U.S.C. section 360bbb-3(b)(1), unless the authorization is terminated or revoked sooner.  Performed at [facility], [address] [place], [place] [address]   Blood Culture (routine x 2)  Status: None (Preliminary result)   Collection Time: 03/05/22  1:14 AM   Specimen: BLOOD  Result Value Ref Range Status   Specimen Description BLOOD RIGHT ANTECUBITAL  Final   Special Requests   Final    BOTTLES DRAWN AEROBIC AND ANAEROBIC Blood Culture results may not be optimal due to an excessive volume of blood received in culture bottles   Culture   Final    NO GROWTH < 12 HOURS Performed at [facility], [address], [place], [place] [address]    Report Status PENDING  Incomplete         Radiology Studies: CT Head Wo Contrast  Result Date: 03/05/2022 CLINICAL DATA:  Head trauma, minor (Age >= 65y) EXAM: CT HEAD WITHOUT CONTRAST TECHNIQUE: Contiguous axial images were obtained from the base of the skull through the vertex without intravenous contrast. RADIATION DOSE REDUCTION: This exam was performed according to the departmental  dose-optimization program which includes automated exposure control, adjustment of the mA and/or kV according to patient size and/or use of iterative reconstruction technique. COMPARISON:  None Available. FINDINGS: Brain: Normal anatomic configuration. Parenchymal volume loss is commensurate with the patient's age. Mild periventricular white matter changes are present likely reflecting the sequela of small vessel ischemia. Dilated perivascular space versus remote lacunar infarct within the right basal ganglia. No abnormal intra or extra-axial mass lesion or fluid collection. No abnormal mass effect or midline shift. No evidence of acute intracranial hemorrhage or infarct. Ventricular size is normal. Cerebellum unremarkable. Vascular: No asymmetric hyperdense vasculature at the skull base. Skull: Intact Sinuses/Orbits: Paranasal sinuses are clear. Orbits are unremarkable. Other: Mastoid air cells and middle ear cavities are clear. IMPRESSION: 1. No acute intracranial abnormality. No calvarial fracture. 2. Mild senescent change. Electronically Signed   By: Fidela Salisbury M.D.   On: 03/05/2022 00:08   DG Chest Port 1 View  Result Date: 03/04/2022 CLINICAL DATA:  Questionable sepsis. EXAM: PORTABLE CHEST 1 VIEW COMPARISON:  Chest radiograph dated 09/16/2018. FINDINGS: Right-sided Port-A-Cath with tip at the cavoatrial junction. No focal consolidation, pleural effusion or pneumothorax. The cardiac silhouette is within normal limits. Median sternotomy wires. No acute osseous pathology. IMPRESSION: No active cardiopulmonary disease. Electronically Signed   By: Anner Crete M.D.   On: 03/04/2022 23:50        Scheduled Meds:  amLODipine  5 mg Oral Daily   cholecalciferol  1,000 Units Oral Daily   docusate sodium  200 mg Oral TID WC   enoxaparin (LOVENOX) injection  40 mg Subcutaneous Q24H   guaiFENesin  600 mg Oral BID   ipratropium-albuterol  3 mL Nebulization QID   levothyroxine  50 mcg Oral Q0600    magnesium oxide  400 mg Oral Daily   omega-3 acid ethyl esters   Oral Daily   pregabalin  75 mg Oral BID   rosuvastatin  20 mg Oral Daily   vitamin E  400 Units Oral Daily   zinc sulfate  220 mg Oral Daily   Continuous Infusions:  sodium chloride 100 mL/hr at 03/05/22 0423   ceFEPime (MAXIPIME) IV     metronidazole     sodium chloride     [START ON 03/06/2022] vancomycin       LOS: 0 days    Time spent: 35 mins     Wyvonnia Dusky, MD [facility] Pager [phone]  If 7PM-7AM, please contact night-coverage [email or web address] 03/05/2022, 8:09 AM

## 2022-03-05 NOTE — Evaluation (Signed)
Clinical/Bedside Swallow Evaluation Patient Details  Name: Phillip [name] MRN: [id number] Date of Birth: 1941-06-13  Today's Date: 03/05/2022 Time: SLP Start Time (ACUTE ONLY): 1135 SLP Stop Time (ACUTE ONLY): 1155 SLP Time Calculation (min) (ACUTE ONLY): 20 min  Past Medical History:  Past Medical History:  Diagnosis Date   Anxiety    Basal cell carcinoma 09/12/2018   Left forehead above lateral brow. Nodular pattern   Cancer ([place])    Coronary artery disease 2003   a. nonobstructive CAD by cath in 2003 and 2005; b.  Status post three-vessel CABG on 12/12/2017 with LIMA to LAD, sequential reverse SVG to OM1 and distal LCx   COVID-19 03/2020   Diverticulosis    Hearing loss    bilateral   Heart disease    Heart murmur    History of benign prostatic hypertrophy    History of radiation therapy 01/31/10-03/22/10   right tonsil/right neck node 7000 cGy 35 sessions, high risk lymph node volume 5940 cGy 35 sessions, low risk lymph node vol 5600 cGy 35 sessions   HPV in male    positive   HTN (hypertension)    Hyperlipidemia    diet controlled- taking medication d/t ensure drinking   Hypothyroid 01/16/2013   Ischemic cardiomyopathy    a. 10/2017: echo showing reduced EF of 40-45%, HK of the anterior, anteroseptal and apical myocardium with Grade 1 DD.    Neck pain    Persistent atrial fibrillation (H[facility])    Primary squamous cell carcinoma of skin of lip 2023   lip and tonuge   Squamous cell carcinoma    right tonsil- 2011   Squamous cell carcinoma of skin 09/02/2019   Crown scalp. WD SCC   Squamous cell carcinoma of skin 09/02/2019   Left distal lateral deltoid. SCCis arising in SK   Thrombocytopenia ([place]) 02/02/2012   unclear etiology   Tinnitus    Tubular adenoma of colon    Past Surgical History:  Past Surgical History:  Procedure Laterality Date   artoscopic knee     CARDIAC CATHETERIZATION  2003, 2005   40% blockage, two 30% blockages treated medically [facility])    CARDIOVERSION N/A 02/12/2018   Procedure: CARDIOVERSION;  Surgeon: [name], MD;  Location: [facility];  Service: Cardiovascular;  Laterality: N/A;   CARDIOVERSION N/A 05/10/2021   Procedure: CARDIOVERSION;  Surgeon: [name], MD;  Location: [facility];  Service: Cardiovascular;  Laterality: N/A;   COLONOSCOPY  03/2015   TA x3, HP, melanosis coli, severe diverticulosis, rpt 3 yrs ([place])   COLONOSCOPY  01/2019   fair prep, diverticulosis, f/u PRN ([place])   CORONARY ARTERY BYPASS GRAFT N/A 12/12/2017   Procedure: CORONARY ARTERY BYPASS GRAFTING (CABG) x3 using the right greater saphenous vein harvested endoscopically and the left internal mammary artery. LIMA to LAD, SEQ SVG to OM1 & OM2;  Surgeon: [name], MD;  Location: [place];  Service: Open Heart Surgery;  Laterality: N/A;   DENTAL SURGERY     extractions   IR THORACENTESIS ASP PLEURAL SPACE W/IMG GUIDE  01/17/2018   KNEE ARTHROSCOPY Left    LEFT HEART CATH AND CORONARY ANGIOGRAPHY N/A 12/10/2017   Procedure: LEFT HEART CATH AND CORONARY ANGIOGRAPHY;  Surgeon: [name], MD;  Location: [place] [facility];  Service: Cardiovascular;  Laterality: N/A;   PEG PLACEMENT  02/2010   PORTA CATH INSERTION N/A 01/26/2022   Procedure: PORTA CATH INSERTION;  Surgeon: Katha Cabal, MD;  Location: [place] [facility];  Service: Cardiovascular;  Laterality: N/A;   TEE WITHOUT CARDIOVERSION N/A 12/12/2017   Procedure: TRANSESOPHAGEAL ECHOCARDIOGRAM (TEE);  Surgeon: [name], MD;  Location: [place];  Service: Open Heart Surgery;  Laterality: N/A;   TONSILLECTOMY     triple bypass  11/2017   VASECTOMY     HPI:  Per H&P "LENVIL SWAIM is a 81 y.o. Caucasian male with medical history significant for coronary artery disease, diverticulosis, hearing loss, hypertension, paroxysmal atrial fibrillation on Eliquis, dyslipidemia, ischemic cardiomyopathy, coronary artery disease, as well as oral cancer status postchemotherapy and  radiotherapy with last session being on Monday, who presented to the emergency room with concerns of generalized weakness and a couple of falls within 3 hours.  The patient was initially noted to be in atrial fibrillation with rapid ventricular response of 160 that responded to 10 mg of IV Cardizem.  The patient recently refused to have a PEG tube and wished to have hospice care.  He has been to his oncologist office to 3 times this week for IV fluids.  No fever or chills.  No cough or wheezing or dyspnea.  No chest pain.  No nausea or vomiting or abdominal pain.  No dysuria, oliguria or hematuria or flank pain.     ED Course: Upon presentation to the emergency room, heart rate was 113 with otherwise normal vital signs.  Labs revealed glucose of 139 with BUN of 24 with calcium of 8.4 and albumin 2.8 with total protein of 5.3.  High-sensitivity troponin was 21.  Lactic acid was 1.3.  COVID-19 PCR is currently pending.  CBC showed leukopenia of 1 and neutropenia with ANC of 0.5 and hemoglobin of 12.1 and hematocrit 36.9 and slightly below previous levels 5 days ago, platelets 121 compared to 144 then.  INR was 2.5 and PT 26.4 with PTT of 192.    EKG as reviewed by me : Fish oil.-EKG showed atrial fibrillation with rapid ventricular response of 133 with left bundle branch block and PVCs.  Imaging: Portable chest ray showed no acute cardiopulmonary disease.  Noncontrasted CT scan revealed no acute intracranial abnormalities.    The patient was given 1 L bolus of IV lactated Ringer's and 1 L bolus of IV normal saline, 10 mg of IV Cardizem and broad-spectrum antibiotic coverage with IV vancomycin, cefepime and Flagyl.  He will be admitted to a cardiac telemetry bed for further evaluation and management."    Assessment / Plan / Recommendation  Clinical Impression  Pt seen for clinical swallowing evaluation. Pt alert, pleasant, and cooperative. "I think I've run my last step." Wife at bedside. Cleared with RN.   Oral  motor examination completed. Notable lower lip swelling and ulceration appreciated. Reduced jaw opening and labial seal appreciated. Pt with loss of saliva on R.   Pt with hx of oral cancer s/p chemo and radiation with last session planned for 03/06/22. Per chart review, pt followed by Hospice and Palliative Medicine and Registered Dietician as an outpatient. Per most recent telephone note ([place], NP), "I spoke with patient and wife by phone.  Patient says that he has decided not to pursue PEG.  He says that he would like to complete his final XRT/chemo treatment on Monday and then transition to focus on comfort at home with hospice involvement.  Wife says that she has already reached out to hospice."   Pt given trial of thin liquids via cup sip. Pt with s/sx at least a moderate oral dysphagia c/b anterior  loss of liquids on R due to reduced jaw opening and reduced labial seal. Pt stated he is unable to masticate solids at this time and declined solid and pureed trials. RN noted pt took pills crushed with applesauce this AM without difficulty. Concern for pharyngeal dysphagia given immediate/prolonged coughing with thin liquids.   Discussed role of SLP, s/sx concerning for oral and pharyngeal dysphagia, and opportunity for further SLP work up given concerns. Pt politely declined. Pt agreeable to a full liquid diet with thins and safe swallowing strategies/aspiration precautions as outlined below.   Recommend Hospice/Palliative Consult for ongoing [place] discussions and Registered Dietician Consult given concern for pt to meet nutritional needs orally.   Pt, wife, and RN made aware of results, recommendations, and SLP POC. SLP to sign off as pt with no acute SLP needs at this time.   SLP Visit Diagnosis: Dysphagia, oropharyngeal phase (R13.12)    Aspiration Risk  Moderate aspiration risk;Risk for inadequate nutrition/hydration    Diet Recommendation Thin liquid (full liquids)   Liquid Administration  via: No straw Medication Administration: Crushed with puree Supervision: Patient able to self feed;Intermittent supervision to cue for compensatory strategies Compensations: Minimize environmental distractions;Slow rate;Small sips/bites Postural Changes: Seated upright at 90 degrees;Remain upright for at least 30 minutes after po intake    Other  Recommendations Recommended Consults:  (RD consult; Palliative/Hospice Consult) Oral Care Recommendations: Oral care QID (support)    Recommendations for follow up therapy are one component of a multi-disciplinary discharge planning process, led by the attending physician.  Recommendations may be updated based on patient status, additional functional criteria and insurance authorization.  Follow up Recommendations No SLP follow up      Assistance Recommended at Discharge  (anticipate need for physical assistance at d/c)  Functional Status Assessment Patient has not had a recent decline in their functional status (pt and wife report pt at baseline level of functioning for swallowing)         Prognosis Prognosis for Safe Diet Advancement: Guarded Barriers to Reach Goals: Severity of deficits      Swallow Study   General Date of Onset: 03/05/22 HPI: Per H&P "MILLEDGE GERDING is a 81 y.o. Caucasian male with medical history significant for coronary artery disease, diverticulosis, hearing loss, hypertension, paroxysmal atrial fibrillation on Eliquis, dyslipidemia, ischemic cardiomyopathy, coronary artery disease, as well as oral cancer status postchemotherapy and radiotherapy with last session being on Monday, who presented to the emergency room with concerns of generalized weakness and a couple of falls within 3 hours.  The patient was initially noted to be in atrial fibrillation with rapid ventricular response of 160 that responded to 10 mg of IV Cardizem.  The patient recently refused to have a PEG tube and wished to have hospice care.  He has been to  his oncologist office to 3 times this week for IV fluids.  No fever or chills.  No cough or wheezing or dyspnea.  No chest pain.  No nausea or vomiting or abdominal pain.  No dysuria, oliguria or hematuria or flank pain.     ED Course: Upon presentation to the emergency room, heart rate was 113 with otherwise normal vital signs.  Labs revealed glucose of 139 with BUN of 24 with calcium of 8.4 and albumin 2.8 with total protein of 5.3.  High-sensitivity troponin was 21.  Lactic acid was 1.3.  COVID-19 PCR is currently pending.  CBC showed leukopenia of 1 and neutropenia with ANC of 0.5 and hemoglobin  of 12.1 and hematocrit 36.9 and slightly below previous levels 5 days ago, platelets 121 compared to 144 then.  INR was 2.5 and PT 26.4 with PTT of 192.    EKG as reviewed by me : Fish oil.-EKG showed atrial fibrillation with rapid ventricular response of 133 with left bundle branch block and PVCs.  Imaging: Portable chest ray showed no acute cardiopulmonary disease.  Noncontrasted CT scan revealed no acute intracranial abnormalities.    The patient was given 1 L bolus of IV lactated Ringer's and 1 L bolus of IV normal saline, 10 mg of IV Cardizem and broad-spectrum antibiotic coverage with IV vancomycin, cefepime and Flagyl.  He will be admitted to a cardiac telemetry bed for further evaluation and management." Type of Study: Bedside Swallow Evaluation Diet Prior to this Study:  (per pt and wife, pt consuming mainly a liquid diet PTA) Respiratory Status: Room air History of Recent Intubation: No Behavior/Cognition: Alert;Cooperative;Pleasant mood Oral Cavity Assessment: Lesions (lower lip swelling; ulcerations; cracked appearance) Oral Care Completed by SLP: Recent completion by staff Oral Cavity - Dentition:  (s/p dental extractions) Vision: Functional for self-feeding Self-Feeding Abilities: Able to feed self Patient Positioning: Upright in bed Baseline Vocal Quality: Normal Volitional Cough:  Strong Volitional Swallow: Able to elicit    Oral/Motor/Sensory Function Overall Oral Motor/Sensory Function: Moderate impairment Facial ROM: Reduced right;Reduced left Lingual ROM:  (unable to assess; with limited ability to open mouth) Mandible: Impaired   Ice Chips Ice chips: Not tested   Thin Liquid Thin Liquid: Impaired Presentation: Cup Oral Phase Impairments: Reduced labial seal Oral Phase Functional Implications: Right anterior spillage Pharyngeal  Phase Impairments: Cough - Immediate    Nectar Thick Nectar Thick Liquid: Not tested   Honey Thick Honey Thick Liquid: Not tested   Puree Puree: Not tested Other Comments: RN noted pt took pills crushed with applesauce this AM without difficulty; pt declined trials   Solid     Solid: Not tested Other Comments: pt decline trials     Cherrie Gauze, M.S., [facility] [phone] [place])   Quintella Baton 03/05/2022,12:36 PM

## 2022-03-05 NOTE — Assessment & Plan Note (Addendum)
-   The patient will be admitted to a cardiac telemetry bed. - We will place him on reverse isolation for pancytopenia. - Oncology consult will be obtained. -I notified Dr. Grayland Ormond about patient. - We will follow daily CBC. - We will place him on broad-spectrum antibiotic therapy with IV vancomycin, cefepime and Flagyl.

## 2022-03-05 NOTE — ED Notes (Signed)
Accessed at 2330 on 7/15

## 2022-03-05 NOTE — ED Notes (Signed)
Per lab WBC 1.08 and abs neutrophil count 0.58/  EDP Sung notified.

## 2022-03-05 NOTE — Assessment & Plan Note (Signed)
-   We will continue Synthroid. 

## 2022-03-05 NOTE — Assessment & Plan Note (Signed)
-   We will continue Prozac. 

## 2022-03-06 ENCOUNTER — Inpatient Hospital Stay: Payer: PPO

## 2022-03-06 ENCOUNTER — Encounter: Payer: Self-pay | Admitting: Oncology

## 2022-03-06 ENCOUNTER — Ambulatory Visit: Payer: PPO

## 2022-03-06 DIAGNOSIS — R627 Adult failure to thrive: Secondary | ICD-10-CM | POA: Diagnosis not present

## 2022-03-06 DIAGNOSIS — C069 Malignant neoplasm of mouth, unspecified: Secondary | ICD-10-CM

## 2022-03-06 DIAGNOSIS — D61818 Other pancytopenia: Secondary | ICD-10-CM | POA: Diagnosis not present

## 2022-03-06 LAB — URINE CULTURE: Culture: NO GROWTH

## 2022-03-06 LAB — CBC
HCT: 32.7 % — ABNORMAL LOW (ref 39.0–52.0)
Hemoglobin: 10.9 g/dL — ABNORMAL LOW (ref 13.0–17.0)
MCH: 32.2 pg (ref 26.0–34.0)
MCHC: 33.3 g/dL (ref 30.0–36.0)
MCV: 96.7 fL (ref 80.0–100.0)
Platelets: 94 10*3/uL — ABNORMAL LOW (ref 150–400)
RBC: 3.38 MIL/uL — ABNORMAL LOW (ref 4.22–5.81)
RDW: 13 % (ref 11.5–15.5)
WBC: 1.2 10*3/uL — CL (ref 4.0–10.5)
nRBC: 0 % (ref 0.0–0.2)

## 2022-03-06 LAB — BASIC METABOLIC PANEL
Anion gap: 3 — ABNORMAL LOW (ref 5–15)
BUN: 16 mg/dL (ref 8–23)
CO2: 24 mmol/L (ref 22–32)
Calcium: 8.2 mg/dL — ABNORMAL LOW (ref 8.9–10.3)
Chloride: 114 mmol/L — ABNORMAL HIGH (ref 98–111)
Creatinine, Ser: 0.56 mg/dL — ABNORMAL LOW (ref 0.61–1.24)
GFR, Estimated: >60 mL/min (ref >60)
Glucose, Bld: 90 mg/dL (ref 70–99)
Potassium: 3.5 mmol/L (ref 3.5–5.1)
Sodium: 141 mmol/L (ref 135–145)

## 2022-03-06 MED ORDER — HYDROMORPHONE HCL 1 MG/ML IJ SOLN: 1.0000 mg | INTRAMUSCULAR | 0 refills | Status: AC | PRN

## 2022-03-06 MED FILL — Dexamethasone Sodium Phosphate Inj 100 MG/10ML: INTRAMUSCULAR | Qty: 1 | Status: AC

## 2022-03-06 NOTE — TOC Transition Note (Signed)
Transition of Care [facility]) - CM/SW Discharge Note   Patient Details  Name: Phillip [name] MRN: [id number] Date of Birth: Jan 07, 1941  [facility] of [facility]) CM/SW Contact:  Candie Chroman, LCSW Phone Number: 03/06/2022, 11:55 AM   Clinical Narrative:  Patient has orders to discharge to the [facility] today. Margaretmary Eddy, RN with [facility] will set up EMS transport. Transport paperwork and DNR (MD aware it needs to be signed) are in his discharge packet. No further concerns. CSW signing off.   Final next level of care: [place] Barriers to Discharge: No Barriers Identified   Patient Goals and CMS Choice        Discharge Placement                Patient to be transferred to facility by: EMS   Patient and family notified of of transfer: 03/06/22  Discharge Plan and Services                                     Social Determinants of Health (SDOH) Interventions     Readmission Risk Interventions     No data to display

## 2022-03-06 NOTE — Progress Notes (Signed)
   03/06/22 1500  Clinical Encounter Type  Visited With Patient and family together  Visit Type Follow-up;Critical Care  Referral From Nurse  Consult/Referral To Chaplain  Spiritual Encounters  Spiritual Needs Prayer;Grief support   Chaplain responded to [facility] consult to provide care for patient who is being transferred to Hospice. Chaplain had previously ministered to patient in cancer center. Care was provided through meaningful conversation, reflective listening and prayer.

## 2022-03-06 NOTE — Progress Notes (Addendum)
Patient refused all PO medications this morning related to discomfort/pain in mouth. Patient reported pain level is 7 out of 10, but refuses pain medication for now. Educated patient about importance of managing pain and to report if he needs pain medication to ease his discomfort. Pt verbalizes understanding and states he will let me know. Jimmye Norman, MD made aware of refusal.

## 2022-03-06 NOTE — Progress Notes (Signed)
[place] [facility]) Hospital Liaison Note   Received request from Dayton Scrape with [facility] for family interest in [place]. Met with patient and spouse Fraser Din to confirm interest and explain services. Patient and spouse agreeable to transfer today. Hospice eligibility confirmed.   Dayton Scrape made aware. RN please call report to [phone] prior to patient leaving the unit. Please send signed and completed DNR with patient at discharge.   Thank you,  Zigmund Gottron RN [facility] Liaison  [phone]

## 2022-03-06 NOTE — Discharge Summary (Addendum)
Physician Discharge Summary  Phillip [name] GQQ:[id number] DOB: 1941/03/17 DOA: 03/04/2022  PCP: [name], MD  Admit date: 03/04/2022 Discharge date: 03/06/2022  Admitted From: home Disposition: hospice home   Recommendations for Outpatient Follow-up:  Follow up with hospice provider ASAP  Home Health: no  Equipment/Devices:  Discharge Condition: hospice  CODE STATUS: DNR Diet recommendation: as tolerated   Brief/Interim Summary: HPI was taken from Dr. Sidney Ace: Phillip [name] is a 81 y.o. Caucasian male with medical history significant for coronary artery disease, diverticulosis, hearing loss, hypertension, paroxysmal atrial fibrillation on Eliquis, dyslipidemia, ischemic cardiomyopathy, coronary artery disease, as well as oral cancer status postchemotherapy and radiotherapy with last session being on Monday, who presented to the emergency room with concerns of generalized weakness and a couple of falls within 3 hours.  The patient was initially noted to be in atrial fibrillation with rapid ventricular response of 160 that responded to 10 mg of IV Cardizem.  The patient recently refused to have a PEG tube and wished to have hospice care.  He has been to his oncologist office to 3 times this week for IV fluids.  No fever or chills.  No cough or wheezing or dyspnea.  No chest pain.  No nausea or vomiting or abdominal pain.  No dysuria, oliguria or hematuria or flank pain.   ED Course: Upon presentation to the emergency room, heart rate was 113 with otherwise normal vital signs.  Labs revealed glucose of 139 with BUN of 24 with calcium of 8.4 and albumin 2.8 with total protein of 5.3.  High-sensitivity troponin was 21.  Lactic acid was 1.3.  COVID-19 PCR is currently pending.  CBC showed leukopenia of 1 and neutropenia with ANC of 0.5 and hemoglobin of 12.1 and hematocrit 36.9 and slightly below previous levels 5 days ago, platelets 121 compared to 144 then.  INR was 2.5 and PT 26.4 with PTT of  192.  EKG as reviewed by me : Fish oil.-EKG showed atrial fibrillation with rapid ventricular response of 133 with left bundle branch block and PVCs. Imaging: Portable chest ray showed no acute cardiopulmonary disease.  Noncontrasted CT scan revealed no acute intracranial abnormalities.  The patient was given 1 L bolus of IV lactated Ringer's and 1 L bolus of IV normal saline, 10 mg of IV Cardizem and broad-spectrum antibiotic coverage with IV vancomycin, cefepime and Flagyl.  He will be admitted to a cardiac telemetry bed for further evaluation and management.   As per Dr. Jimmye Norman 7/16-7/17/23: Pt presented w/ pancytopenia likely secondary to recent chemo and not eating or drinking much for weeks. Urine and blood cxs showed NGTD. Pt was started on IV abxs for suspected infection but no known source. After discussing pt's condition and care w/ pt's wife, Fraser Din, she stated that the pt had decided we wanted just to be made comfort care. Hospice evaluated the pt and recommended pt be transferred to hospice home. Pt and pt's wife were in agreement.      Discharge Diagnoses:  Principal Problem:   Pancytopenia ([place]) Active Problems:   Lower respiratory infection   Hypothyroidism   Coagulopathy (HCC)   Hyperlipidemia LDL goal <70   Chronic atrial fibrillation with RVR (HCC)   Depression   Peripheral neuropathy  Failure to thrive: likely secondary to oral cancer & not eating or drinking for several weeks. Pt and pt's wife decided to proceed w/ comfort care only. Pt will be d/c to hospice home   Pancytopenia: likely secondary to  recent chemo. Continue on neutropenic precautions. D/c abxs as pt is comfort care only. Blood & urine cxs NGTD  Oral cancer: s/p chemo and radiation. Management as per onco. Continue on neutropenic precautions  Hypokalemia:WNL today   Possible upper respiratory infection: CXR showed no acute cardiopulmonary process, so r/o lower respiratory   Hypothyroidism: comfort  care only  Peripheral neuropathy: continue on home dose of pregabalin    Depression: severity unknown. Comfort care only    Chronic  a. fib: w/ RVR. S/p IV cardizem x1. Comfort care only    HLD: comfort care only    Hypocalcemia:  improved w/ Ca gluconate   Discharge Instructions  Discharge Instructions     Diet general   Complete by: As directed    As tolerated   Discharge instructions   Complete by: As directed    F/u w/ hospice provider as soon as possible   Increase activity slowly   Complete by: As directed       Allergies as of 03/06/2022       Reactions   Ace Inhibitors Swelling, Other (See Comments)   Possible angioedema (upper lip swelling)   Morphine And Related Swelling   SWELLING REACTION UNSPECIFIED  [severity rated per PMH, 12/11/2017]   Motrin [ibuprofen]         Medication List     STOP taking these medications    amLODipine 5 MG tablet Commonly known as: NORVASC   apixaban 5 MG Tabs tablet Commonly known as: Eliquis   dexamethasone 4 MG tablet Commonly known as: DECADRON   furosemide 20 MG tablet Commonly known as: LASIX   levothyroxine 50 MCG tablet Commonly known as: Euthyrox   MAGNESIUM PO   nitroGLYCERIN 0.4 MG SL tablet Commonly known as: NITROSTAT   OMEGA 3 PO   Potassium 99 MG Tabs   rosuvastatin 20 MG tablet Commonly known as: CRESTOR   VITAMIN A PO   Vitamin D3 25 MCG (1000 UT) Caps   vitamin E 180 MG (400 UNITS) capsule   Zinc 50 MG Caps   ZINC PO       TAKE these medications    docusate sodium 250 MG capsule Commonly known as: COLACE Takes total '600mg'$  /day   fluconazole 200 MG tablet Commonly known as: DIFLUCAN Take 1 tablet (200 mg total) by mouth daily.   fluocinonide gel 0.05 % Commonly known as: LIDEX   FLUoxetine 20 MG capsule Commonly known as: PROZAC Take 1 capsule (20 mg total) by mouth daily.   Hurricaine 20 % Aero Generic drug: Benzocaine Use as directed 1 Application in the  mouth or throat as needed.   HYDROmorphone 1 MG/ML injection Commonly known as: DILAUDID Inject 1 mL (1 mg total) into the vein every 2 (two) hours as needed for moderate pain or severe pain.   lidocaine-prilocaine cream Commonly known as: EMLA Apply to affected area once   ondansetron 4 MG disintegrating tablet Commonly known as: ZOFRAN-ODT Take 1 tablet (4 mg total) by mouth every 8 (eight) hours as needed for nausea or vomiting.   ondansetron 8 MG tablet Commonly known as: Zofran Take 1 tablet (8 mg total) by mouth 2 (two) times daily as needed for refractory nausea / vomiting. Start on day 3 after chemo.   polyethylene glycol 17 g packet Commonly known as: MIRALAX / GLYCOLAX Take 17 g by mouth daily as needed.   pregabalin 75 MG capsule Commonly known as: Lyrica Take 1 capsule (75 mg total) by mouth  2 (two) times daily.   prochlorperazine 10 MG tablet Commonly known as: COMPAZINE Take 1 tablet (10 mg total) by mouth every 6 (six) hours as needed (Nausea or vomiting).   traMADol 50 MG tablet Commonly known as: ULTRAM Take 1 tablet (50 mg total) by mouth every 6 (six) hours as needed.   zaleplon 10 MG capsule Commonly known as: SONATA Take 1 capsule (10 mg total) by mouth at bedtime as needed for sleep.        Allergies  Allergen Reactions   Ace Inhibitors Swelling and Other (See Comments)    Possible angioedema (upper lip swelling)   Morphine And Related Swelling    SWELLING REACTION UNSPECIFIED  [severity rated per PMH, 12/11/2017]   Motrin [Ibuprofen]     Consultations: Hospice    Procedures/Studies: CT Head Wo Contrast  Result Date: 03/05/2022 CLINICAL DATA:  Head trauma, minor (Age >= 65y) EXAM: CT HEAD WITHOUT CONTRAST TECHNIQUE: Contiguous axial images were obtained from the base of the skull through the vertex without intravenous contrast. RADIATION DOSE REDUCTION: This exam was performed according to the departmental dose-optimization program which  includes automated exposure control, adjustment of the mA and/or kV according to patient size and/or use of iterative reconstruction technique. COMPARISON:  None Available. FINDINGS: Brain: Normal anatomic configuration. Parenchymal volume loss is commensurate with the patient's age. Mild periventricular white matter changes are present likely reflecting the sequela of small vessel ischemia. Dilated perivascular space versus remote lacunar infarct within the right basal ganglia. No abnormal intra or extra-axial mass lesion or fluid collection. No abnormal mass effect or midline shift. No evidence of acute intracranial hemorrhage or infarct. Ventricular size is normal. Cerebellum unremarkable. Vascular: No asymmetric hyperdense vasculature at the skull base. Skull: Intact Sinuses/Orbits: Paranasal sinuses are clear. Orbits are unremarkable. Other: Mastoid air cells and middle ear cavities are clear. IMPRESSION: 1. No acute intracranial abnormality. No calvarial fracture. 2. Mild senescent change. Electronically Signed   By: Fidela Salisbury M.D.   On: 03/05/2022 00:08   DG Chest Port 1 View  Result Date: 03/04/2022 CLINICAL DATA:  Questionable sepsis. EXAM: PORTABLE CHEST 1 VIEW COMPARISON:  Chest radiograph dated 09/16/2018. FINDINGS: Right-sided Port-A-Cath with tip at the cavoatrial junction. No focal consolidation, pleural effusion or pneumothorax. The cardiac silhouette is within normal limits. Median sternotomy wires. No acute osseous pathology. IMPRESSION: No active cardiopulmonary disease. Electronically Signed   By: Anner Crete M.D.   On: 03/04/2022 23:50   (Echo, Carotid, EGD, Colonoscopy, ERCP)    Subjective: Pt c/o mouth/lip pain   Discharge Exam: Vitals:   03/06/22 0802 03/06/22 0814  BP: 123/78 116/80  Pulse: 68 89  Resp: 16 20  Temp: 98.3 F (36.8 C) 98.3 F (36.8 C)  SpO2: 94% 93%   Vitals:   03/06/22 0500 03/06/22 0749 03/06/22 0802 03/06/22 0814  BP:   123/78 116/80   Pulse:   68 89  Resp:   16 20  Temp:   98.3 F (36.8 C) 98.3 F (36.8 C)  TempSrc:    Oral  SpO2:  94% 94% 93%  Weight: 75.8 kg     Height:        General: Pt is alert, awake but uncomfortable  Cardiovascular: irregularly irregular, no rubs, no gallops Respiratory: CTA bilaterally, no wheezing, no rhonchi Abdominal: Soft, NT, ND, bowel sounds + Extremities: no edema, no cyanosis    The results of significant diagnostics from this hospitalization (including imaging, microbiology, ancillary and laboratory) are listed below  for reference.     Microbiology: Recent Results (from the past 240 hour(s))  Blood Culture (routine x 2)     Status: None (Preliminary result)   Collection Time: 03/04/22 11:30 PM   Specimen: Porta Cath; Blood  Result Value Ref Range Status   Specimen Description PORTA CATH  Final   Special Requests   Final    BOTTLES DRAWN AEROBIC AND ANAEROBIC Blood Culture adequate volume   Culture   Final    NO GROWTH 1 DAY Performed at [facility], [address], [place], [place] [address]    Report Status PENDING  Incomplete  SARS Coronavirus 2 by RT PCR (hospital order, performed in [place] hospital lab) *cepheid single result test* Anterior Nasal Swab     Status: None   Collection Time: 03/05/22  1:04 AM   Specimen: Anterior Nasal Swab  Result Value Ref Range Status   SARS Coronavirus 2 by RT PCR NEGATIVE NEGATIVE Final    Comment: (NOTE) SARS-CoV-2 target nucleic acids are NOT DETECTED.  The SARS-CoV-2 RNA is generally detectable in upper and lower respiratory specimens during the acute phase of infection. The lowest concentration of SARS-CoV-2 viral copies this assay can detect is 250 copies / mL. A negative result does not preclude SARS-CoV-2 infection and should not be used as the sole basis for treatment or other patient management decisions.  A negative result may occur with improper specimen collection / handling, submission of  specimen other than nasopharyngeal swab, presence of viral mutation(s) within the areas targeted by this assay, and inadequate number of viral copies (<250 copies / mL). A negative result must be combined with clinical observations, patient history, and epidemiological information.  Fact Sheet for Patients:   https://www.patel.info/  Fact Sheet for Healthcare Providers: https://hall.com/  This test is not yet approved or  cleared by the [place] FDA and has been authorized for detection and/or diagnosis of SARS-CoV-2 by FDA under an Emergency Use Authorization (EUA).  This EUA will remain in effect (meaning this test can be used) for the duration of the COVID-19 declaration under Section 564(b)(1) of the Act, 21 U.S.C. section 360bbb-3(b)(1), unless the authorization is terminated or revoked sooner.  Performed at [facility], [address] [place], [place] [address]   Blood Culture (routine x 2)     Status: None (Preliminary result)   Collection Time: 03/05/22  1:14 AM   Specimen: BLOOD  Result Value Ref Range Status   Specimen Description BLOOD RIGHT ANTECUBITAL  Final   Special Requests   Final    BOTTLES DRAWN AEROBIC AND ANAEROBIC Blood Culture results may not be optimal due to an excessive volume of blood received in culture bottles   Culture   Final    NO GROWTH 1 DAY Performed at [facility], [address] [place], [place] [address]    Report Status PENDING  Incomplete  Urine Culture     Status: None   Collection Time: 03/05/22  4:04 AM   Specimen: In/Out Cath Urine  Result Value Ref Range Status   Specimen Description   Final    IN/OUT CATH URINE Performed at [facility], [address], [place], [place] [address]    Special Requests   Final    NONE Performed at [facility], [address], [place], [place] [address]    Culture   Final    NO GROWTH Performed at [facility], [place] [address],  [place], [place] [address]    Report Status 03/06/2022 FINAL  Final     Labs: BNP (last 3 results) Recent Labs    03/04/22 2330  BNP 720.9*   Basic Metabolic Panel: Recent Labs  Lab 03/05/22 0032 03/05/22 0404 03/06/22 0500  NA 140 135 141  K 3.6 3.3* 3.5  CL 109 106 114*  CO2 26 21* 24  GLUCOSE 139* 107* 90  BUN 24* 19 16  CREATININE 0.83 0.58* 0.56*  CALCIUM 8.4* 7.0* 8.2*   Liver Function Tests: Recent Labs  Lab 03/05/22 0032  AST 22  ALT 28  ALKPHOS 38  BILITOT 0.9  PROT 5.3*  ALBUMIN 2.8*   No results for input(s): "LIPASE", "AMYLASE" in the last 168 hours. No results for input(s): "AMMONIA" in the last 168 hours. CBC: Recent Labs  Lab 03/04/22 2330 03/05/22 0404 03/06/22 0500  WBC 1.0* 1.5* 1.2*  NEUTROABS 0.5*  --   --   HGB 12.1* 11.5* 10.9*  HCT 36.9* 34.6* 32.7*  MCV 98.7 98.9 96.7  PLT 121* 85* 94*   Cardiac Enzymes: No results for input(s): "CKTOTAL", "CKMB", "CKMBINDEX", "TROPONINI" in the last 168 hours. BNP: Invalid input(s): "POCBNP" CBG: No results for input(s): "GLUCAP" in the last 168 hours. D-Dimer No results for input(s): "DDIMER" in the last 72 hours. Hgb A1c No results for input(s): "HGBA1C" in the last 72 hours. Lipid Profile No results for input(s): "CHOL", "HDL", "LDLCALC", "TRIG", "CHOLHDL", "LDLDIRECT" in the last 72 hours. Thyroid function studies No results for input(s): "TSH", "T4TOTAL", "T3FREE", "THYROIDAB" in the last 72 hours.  Invalid input(s): "FREET3" Anemia work up No results for input(s): "VITAMINB12", "FOLATE", "FERRITIN", "TIBC", "IRON", "RETICCTPCT" in the last 72 hours. Urinalysis    Component Value Date/Time   COLORURINE YELLOW (A) 03/05/2022 0404   APPEARANCEUR HAZY (A) 03/05/2022 0404   APPEARANCEUR Clear 07/01/2018 0848   LABSPEC 1.019 03/05/2022 0404   PHURINE 6.0 03/05/2022 0404   GLUCOSEU NEGATIVE 03/05/2022 0404   HGBUR NEGATIVE 03/05/2022 0404    BILIRUBINUR NEGATIVE 03/05/2022 0404   BILIRUBINUR Negative 07/01/2018 0848   KETONESUR 5 (A) 03/05/2022 0404   PROTEINUR 30 (A) 03/05/2022 0404   UROBILINOGEN negative 10/13/2014 1421   UROBILINOGEN 1.0 03/13/2010 2105   NITRITE NEGATIVE 03/05/2022 0404   LEUKOCYTESUR NEGATIVE 03/05/2022 0404   Sepsis Labs Recent Labs  Lab 03/04/22 2330 03/05/22 0404 03/06/22 0500  WBC 1.0* 1.5* 1.2*   Microbiology Recent Results (from the past 240 hour(s))  Blood Culture (routine x 2)     Status: None (Preliminary result)   Collection Time: 03/04/22 11:30 PM   Specimen: Porta Cath; Blood  Result Value Ref Range Status   Specimen Description PORTA CATH  Final   Special Requests   Final    BOTTLES DRAWN AEROBIC AND ANAEROBIC Blood Culture adequate volume   Culture   Final    NO GROWTH 1 DAY Performed at [facility], [address] [place], [place] [address]    Report Status PENDING  Incomplete  SARS Coronavirus 2 by RT PCR (hospital order, performed in [place] hospital lab) *cepheid single result test* Anterior Nasal Swab     Status: None   Collection Time: 03/05/22  1:04 AM   Specimen: Anterior Nasal Swab  Result Value Ref Range Status   SARS Coronavirus 2 by RT PCR NEGATIVE NEGATIVE Final    Comment: (NOTE) SARS-CoV-2 target nucleic acids are NOT DETECTED.  The SARS-CoV-2 RNA is generally detectable in upper and lower respiratory  specimens during the acute phase of infection. The lowest concentration of SARS-CoV-2 viral copies this assay can detect is 250 copies / mL. A negative result does not preclude SARS-CoV-2 infection and should not be used as the sole basis for treatment or other patient management decisions.  A negative result may occur with improper specimen collection / handling, submission of specimen other than nasopharyngeal swab, presence of viral mutation(s) within the areas targeted by this assay, and inadequate number of viral copies (<250 copies /  mL). A negative result must be combined with clinical observations, patient history, and epidemiological information.  Fact Sheet for Patients:   https://www.patel.info/  Fact Sheet for Healthcare Providers: https://hall.com/  This test is not yet approved or  cleared by the [place] FDA and has been authorized for detection and/or diagnosis of SARS-CoV-2 by FDA under an Emergency Use Authorization (EUA).  This EUA will remain in effect (meaning this test can be used) for the duration of the COVID-19 declaration under Section 564(b)(1) of the Act, 21 U.S.C. section 360bbb-3(b)(1), unless the authorization is terminated or revoked sooner.  Performed at [facility], [address] [place], [place] [address]   Blood Culture (routine x 2)     Status: None (Preliminary result)   Collection Time: 03/05/22  1:14 AM   Specimen: BLOOD  Result Value Ref Range Status   Specimen Description BLOOD RIGHT ANTECUBITAL  Final   Special Requests   Final    BOTTLES DRAWN AEROBIC AND ANAEROBIC Blood Culture results may not be optimal due to an excessive volume of blood received in culture bottles   Culture   Final    NO GROWTH 1 DAY Performed at [facility], [address] [place], [place] [address]    Report Status PENDING  Incomplete  Urine Culture     Status: None   Collection Time: 03/05/22  4:04 AM   Specimen: In/Out Cath Urine  Result Value Ref Range Status   Specimen Description   Final    IN/OUT CATH URINE Performed at [facility], [address] [place], [place] [address]    Special Requests   Final    NONE Performed at [facility], [address] [place], [place] [address]    Culture   Final    NO GROWTH Performed at [facility], [place] [address], [place], [place] [address]    Report Status 03/06/2022 FINAL  Final     Time coordinating discharge: Over 30  minutes  SIGNED:   Wyvonnia Dusky, MD  [facility] 03/06/2022, 11:40 AM Pager   If 7PM-7AM, please contact night-coverage [email or web address]

## 2022-03-06 NOTE — Progress Notes (Deleted)
Report given to [facility] from [facility]

## 2022-03-06 NOTE — Progress Notes (Signed)
[place] [facility]) Hospital Liaison Note  Mr. Strey was scheduled to have an admission visit today with [facility].  Plan to meet with patient and wife to discuss hospice options and goals.  Will update after meeting.  Please call with any hospice related questions or concerns.  Thank you, Margaretmary Eddy, BSN, RN [facility] Liaison [phone]

## 2022-03-06 NOTE — Progress Notes (Signed)
Nutrition Brief Note  Chart reviewed. Pt now transitioning to comfort care.  No further nutrition interventions planned at this time.  Please re-consult as needed.   Sianna Garofano W, RD, LDN, CDCES Registered Dietitian II Certified Diabetes Care and Education Specialist Please refer to AMION for RD and/or RD on-call/weekend/after hours pager   

## 2022-03-06 NOTE — Progress Notes (Signed)
Report given to Rosanne Ashing, Therapist, sports from [employer]

## 2022-03-07 ENCOUNTER — Encounter: Admission: RE | Payer: Self-pay | Source: Home / Self Care

## 2022-03-07 ENCOUNTER — Ambulatory Visit: Admission: RE | Admit: 2022-03-07 | Payer: PPO | Source: Home / Self Care | Admitting: Internal Medicine

## 2022-03-07 SURGERY — CARDIOVERSION
Anesthesia: General

## 2022-03-07 NOTE — Telephone Encounter (Signed)
Attempted to reach pt's wife regarding follow up that is still scheduled 03/28/22 that was to be for cardioversion follow up. Will notify pt's wife on return call that we will cancel this appointment. I did not want to cancel without speaking with pt's wife.   On return call if pt's wife has no further questions we may cancel current appt 03/28/22 with Cadence Kathlen Mody, PA-C. Thanks.

## 2022-03-08 NOTE — Telephone Encounter (Signed)
Pt's wife returned call. Wife agreed to cancel appt 03/28/22. Mrs. Abarca is aware that Dr. Saunders Revel is happy to see Phillip [name] on an as needed basis. Mrs. Schellhase very appreciative and has no further needs at this time.

## 2022-03-10 ENCOUNTER — Telehealth: Payer: Self-pay | Admitting: Family Medicine

## 2022-03-10 LAB — CULTURE, BLOOD (ROUTINE X 2)
Culture: NO GROWTH
Culture: NO GROWTH
Special Requests: ADEQUATE

## 2022-03-10 NOTE — Telephone Encounter (Signed)
Spoke with pt and wife - he is currently admitted at hospice house.

## 2022-03-13 ENCOUNTER — Ambulatory Visit: Payer: PPO | Admitting: Hospice and Palliative Medicine

## 2022-03-13 ENCOUNTER — Other Ambulatory Visit: Payer: PPO

## 2022-03-13 ENCOUNTER — Ambulatory Visit: Payer: PPO

## 2022-03-17 NOTE — Telephone Encounter (Addendum)
Spoke with wife Phillip [name] to express my condolences about [name]'s passing - left message.

## 2022-03-21 DEATH — deceased

## 2022-03-23 ENCOUNTER — Telehealth: Payer: PPO | Admitting: Hospice and Palliative Medicine

## 2022-03-28 ENCOUNTER — Ambulatory Visit: Payer: PPO | Admitting: Medical

## 2022-04-05 ENCOUNTER — Ambulatory Visit: Payer: PPO | Admitting: Radiation Oncology

## 2022-04-27 ENCOUNTER — Other Ambulatory Visit: Payer: PPO

## 2022-05-02 ENCOUNTER — Other Ambulatory Visit: Payer: PPO

## 2022-05-08 ENCOUNTER — Ambulatory Visit: Payer: PPO | Admitting: Family Medicine

## 2023-07-17 IMAGING — PT NM PET TUM IMG INITIAL (PI) SKULL BASE T - THIGH
2 series · 2 of 2 positions shown · non-contrast
Comparison: None.

CLINICAL DATA: Initial treatment strategy for head neck carcinoma.
Additional history of esophageal carcinoma.

EXAM:
NUCLEAR MEDICINE PET SKULL BASE TO THIGH
TECHNIQUE: 9.3 mCi F-18 FDG was injected intravenously. Full-ring PET imaging
was performed from the skull base to thigh after the radiotracer. CT
data was obtained and used for attenuation correction and anatomic
localization.
Fasting blood glucose: 132 mg/dl

[Series 1092: results mm oncology reading · 4.0mm · 1.01mm/px · 1 of 1 slices shown (1 of 2)]
[im 1/1]
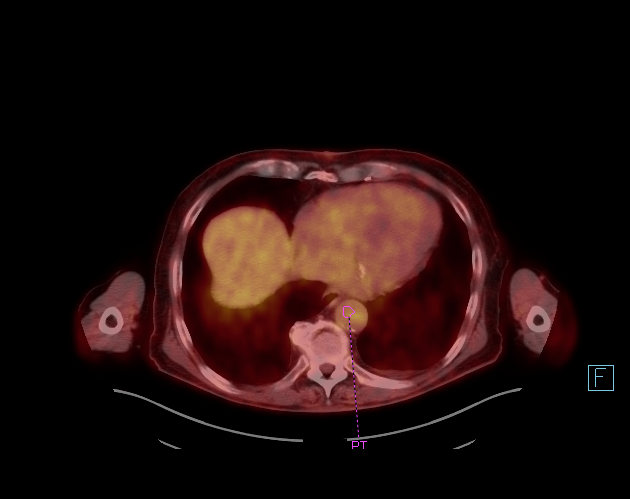

[Series 1204: results mm oncology reading · 4.0mm · 1.30mm/px · 1 of 1 slices shown (2 of 2)]
[im 1/1]
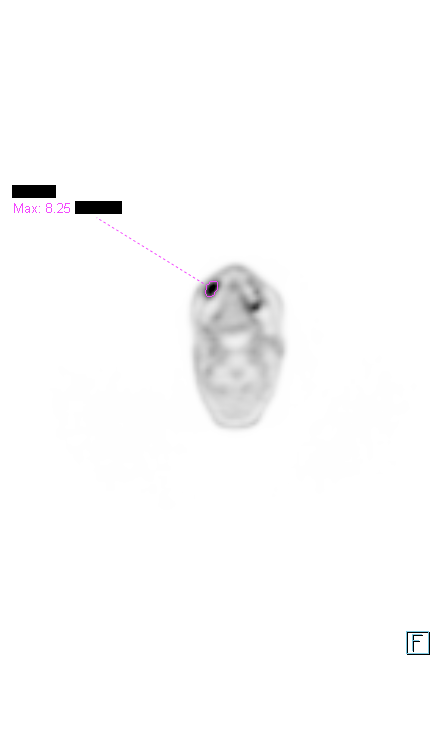

[2 of 2 positions shown; findings below may reference images not displayed]

FINDINGS: Mediastinal blood pool activity: SUV max

Liver activity: SUV max NA

NECK: No asymmetric or increased hypermetabolic activity in the
oropharynx or hypopharynx.

No hypermetabolic cervical lymph nodes.

Incidental CT findings: none

CHEST: No hypermetabolic mediastinal or hilar nodes. No suspicious
pulmonary nodules on the CT scan. No abnormal metabolic activity
through the esophagus.

Incidental CT findings: Post CABG.  Small LEFT effusion

ABDOMEN/PELVIS: No abnormal hypermetabolic activity within the
liver, pancreas, adrenal glands, or spleen. No hypermetabolic lymph
nodes in the abdomen or pelvis.

Incidental CT findings: Multiple diverticula of the descending colon
and sigmoid colon without acute inflammation. Atherosclerotic
calcification of the aorta.

SKELETON: No focal hypermetabolic activity to suggest skeletal
metastasis.

Incidental CT findings: none
IMPRESSION: 1. No evidence of head neck carcinoma cervical nodal metastasis. No
primary identified.
2. No evidence esophageal carcinoma.
3. No evidence of metastatic disease.
4. Small LEFT effusion.
